# Patient Record
Sex: Male | Born: 1955 | Race: Black or African American | Hispanic: No | Marital: Single | Smoking: Current every day smoker
Health system: Southern US, Community
[De-identification: ages and names within clinical notes are randomized; demographics above are authoritative.]

## PROBLEM LIST (undated history)

## (undated) DIAGNOSIS — I10 Essential (primary) hypertension: Secondary | ICD-10-CM

## (undated) DIAGNOSIS — Z91199 Patient's noncompliance with other medical treatment and regimen due to unspecified reason: Secondary | ICD-10-CM

## (undated) DIAGNOSIS — N182 Chronic kidney disease, stage 2 (mild): Secondary | ICD-10-CM

## (undated) DIAGNOSIS — I739 Peripheral vascular disease, unspecified: Secondary | ICD-10-CM

## (undated) DIAGNOSIS — I219 Acute myocardial infarction, unspecified: Secondary | ICD-10-CM

## (undated) DIAGNOSIS — Z720 Tobacco use: Secondary | ICD-10-CM

## (undated) DIAGNOSIS — T8189XA Other complications of procedures, not elsewhere classified, initial encounter: Secondary | ICD-10-CM

## (undated) DIAGNOSIS — E785 Hyperlipidemia, unspecified: Secondary | ICD-10-CM

## (undated) DIAGNOSIS — Z9119 Patient's noncompliance with other medical treatment and regimen: Secondary | ICD-10-CM

## (undated) DIAGNOSIS — E119 Type 2 diabetes mellitus without complications: Secondary | ICD-10-CM

## (undated) DIAGNOSIS — F0390 Unspecified dementia without behavioral disturbance: Secondary | ICD-10-CM

## (undated) DIAGNOSIS — I2510 Atherosclerotic heart disease of native coronary artery without angina pectoris: Secondary | ICD-10-CM

## (undated) DIAGNOSIS — G9340 Encephalopathy, unspecified: Secondary | ICD-10-CM

## (undated) DIAGNOSIS — Z89611 Acquired absence of right leg above knee: Secondary | ICD-10-CM

## (undated) HISTORY — PX: FRACTURE SURGERY: SHX138

## (undated) SURGICAL SUPPLY — 1 items: SOLN 0.9% NACL 1000 ML (IV SOLUTION) ×2 IMPLANT

---

## 1998-06-25 HISTORY — PX: TIBIA FRACTURE SURGERY: SHX806

## 1998-11-08 ENCOUNTER — Emergency Department (HOSPITAL_COMMUNITY): Admission: EM | Admit: 1998-11-08 | Discharge: 1998-11-08 | Payer: Self-pay | Admitting: Emergency Medicine

## 1998-11-08 ENCOUNTER — Encounter: Payer: Self-pay | Admitting: Emergency Medicine

## 1998-11-14 ENCOUNTER — Encounter: Admission: RE | Admit: 1998-11-14 | Discharge: 1998-11-14 | Payer: Self-pay | Admitting: Internal Medicine

## 1999-01-10 ENCOUNTER — Emergency Department (HOSPITAL_COMMUNITY): Admission: EM | Admit: 1999-01-10 | Discharge: 1999-01-10 | Payer: Self-pay | Admitting: Emergency Medicine

## 2002-10-19 ENCOUNTER — Encounter: Payer: Self-pay | Admitting: Emergency Medicine

## 2002-10-20 ENCOUNTER — Inpatient Hospital Stay (HOSPITAL_COMMUNITY): Admission: AC | Admit: 2002-10-20 | Discharge: 2002-10-23 | Payer: Self-pay

## 2002-10-27 ENCOUNTER — Encounter: Admission: RE | Admit: 2002-10-27 | Discharge: 2002-10-27 | Payer: Self-pay | Admitting: Family Medicine

## 2004-06-12 ENCOUNTER — Inpatient Hospital Stay (HOSPITAL_COMMUNITY): Admission: EM | Admit: 2004-06-12 | Discharge: 2004-06-14 | Payer: Self-pay | Admitting: Emergency Medicine

## 2005-10-24 ENCOUNTER — Encounter: Payer: Self-pay | Admitting: Emergency Medicine

## 2012-10-18 ENCOUNTER — Emergency Department (HOSPITAL_COMMUNITY): Payer: Self-pay

## 2012-10-18 ENCOUNTER — Emergency Department (HOSPITAL_COMMUNITY)
Admission: EM | Admit: 2012-10-18 | Discharge: 2012-10-18 | Disposition: A | Discharge status: Home / Self Care | Payer: Self-pay | Attending: Emergency Medicine | Admitting: Emergency Medicine

## 2012-10-18 ENCOUNTER — Encounter (HOSPITAL_COMMUNITY): Payer: Self-pay | Admitting: Physical Medicine and Rehabilitation

## 2012-10-18 DIAGNOSIS — S6390XA Sprain of unspecified part of unspecified wrist and hand, initial encounter: Secondary | ICD-10-CM | POA: Insufficient documentation

## 2012-10-18 DIAGNOSIS — S6391XA Sprain of unspecified part of right wrist and hand, initial encounter: Secondary | ICD-10-CM

## 2012-10-18 DIAGNOSIS — Y939 Activity, unspecified: Secondary | ICD-10-CM | POA: Insufficient documentation

## 2012-10-18 DIAGNOSIS — Y929 Unspecified place or not applicable: Secondary | ICD-10-CM | POA: Insufficient documentation

## 2012-10-18 DIAGNOSIS — X58XXXA Exposure to other specified factors, initial encounter: Secondary | ICD-10-CM | POA: Insufficient documentation

## 2012-10-18 DIAGNOSIS — R05 Cough: Secondary | ICD-10-CM | POA: Insufficient documentation

## 2012-10-18 DIAGNOSIS — R059 Cough, unspecified: Secondary | ICD-10-CM | POA: Insufficient documentation

## 2012-10-18 DIAGNOSIS — R053 Chronic cough: Secondary | ICD-10-CM

## 2012-10-18 DIAGNOSIS — R079 Chest pain, unspecified: Secondary | ICD-10-CM | POA: Insufficient documentation

## 2012-10-18 MED ORDER — HYDROCODONE-ACETAMINOPHEN 5-325 MG PO TABS
1.0000 | ORAL_TABLET | Freq: Once | ORAL | Status: AC
  Administered 2012-10-18: 1 via ORAL
  Filled 2012-10-18: qty 1

## 2012-10-18 MED ORDER — TRAMADOL HCL 50 MG PO TABS: 50.0000 mg | ORAL_TABLET | Freq: Four times a day (QID) | ORAL | Status: DC | PRN

## 2012-10-18 NOTE — ED Provider Notes (Signed)
Medical screening examination/treatment/procedure(s) were performed by non-physician practitioner and as supervising physician I was immediately available for consultation/collaboration.   Charles B. Sheldon, MD 10/18/12 1543 

## 2012-10-18 NOTE — ED Provider Notes (Signed)
History     CSN: [id number]  Arrival date & time 10/18/12  1013   First MD Initiated Contact with Patient 10/18/12 1107      Chief Complaint  Patient presents with  . Cough  . Pain    (Consider location/radiation/quality/duration/timing/severity/associated sxs/prior treatment) Patient is a 57 y.o. male presenting with cough. The history is provided by the patient.  Cough Cough characteristics:  Productive Severity:  Moderate Duration:  4 weeks Timing:  Intermittent Associated symptoms: chest pain   Associated symptoms: no chills, no fever, no myalgias and no sore throat   Associated symptoms comment:  Cough for the past month now with severe right sided rib pain with movement or cough. No fever. He also complains of right hand pain that started this morning without known injury.   No past medical history on file.  No past surgical history on file.  History reviewed. No pertinent family history.  History  Substance Use Topics  . Smoking status: Never Smoker   . Smokeless tobacco: Not on file  . Alcohol Use: No      Review of Systems  Constitutional: Negative for fever and chills.  HENT: Negative for congestion, sore throat, neck pain and sinus pressure.   Respiratory: Positive for cough.   Cardiovascular: Positive for chest pain.  Gastrointestinal: Negative for vomiting and abdominal pain.  Musculoskeletal: Negative for myalgias.    Allergies  Review of patient's allergies indicates no known allergies.  Home Medications  No current outpatient prescriptions on file.  BP 156/96  Pulse 95  Temp(Src) 97.7 F (36.5 C) (Oral)  Resp 20  SpO2 99%  Physical Exam  Constitutional: He is oriented to person, place, and time. He appears well-developed and well-nourished.  HENT:  Head: Normocephalic.  Neck: Normal range of motion. Neck supple.  Cardiovascular: Normal rate and regular rhythm.   Pulmonary/Chest: Effort normal and breath sounds normal. He exhibits  tenderness.  Right sided chest tenderness along mid-axillary line, lower chest.  Abdominal: Soft. Bowel sounds are normal. There is no tenderness. There is no rebound and no guarding.  Musculoskeletal: Normal range of motion.  Neurological: He is alert and oriented to person, place, and time.  Skin: Skin is warm and dry. No rash noted.  Psychiatric: He has a normal mood and affect.    ED Course  Procedures (including critical care time)  Labs Reviewed - No data to display No results found.  Dg Ribs Unilateral W/chest Right  10/18/2012  *RADIOLOGY REPORT*  Clinical Data: Cough.  Right-sided chest pain.  RIGHT RIBS AND CHEST - 3+ VIEW  Comparison: Chest x-ray 06/12/2004.  Findings: Lung volumes are normal.  No consolidative airspace disease.  No pleural effusions.  No pneumothorax.  No pulmonary nodule or mass noted.  Pulmonary vasculature and the cardiomediastinal silhouette are within normal limits.  Multiple old healed bilateral rib fractures.  Dedicated views of the right ribs demonstrate no definite acute displaced right-sided rib fractures.  IMPRESSION: 1.  No radiographic evidence of acute cardiopulmonary disease. 2.  No acute displaced right-sided rib fractures. 3.  Multiple old healed bilateral rib fractures are again noted.   Original Report Authenticated By: Trudie Reed, M.D.    No diagnosis found.  1. Cough, persistent 2. Mild hand sprain  MDM  CXR without pneumonia or rib injury. Will prescribe abx based on duration of symptoms of greater than one month.         Arnoldo Hooker, PA-C 10/18/12 1306

## 2012-10-18 NOTE — ED Notes (Signed)
Pt presents to department for evaluation of cough, R sided rib pain, R hand and leg pain. Ongoing for several days. Respirations unlabored. Speaking complete sentences. Pt is alert and oriented x4. No signs of acute distress noted.

## 2013-11-13 ENCOUNTER — Ambulatory Visit (INDEPENDENT_AMBULATORY_CARE_PROVIDER_SITE_OTHER): Payer: Self-pay | Admitting: Internal Medicine

## 2013-11-13 ENCOUNTER — Encounter: Payer: Self-pay | Admitting: Internal Medicine

## 2013-11-13 ENCOUNTER — Ambulatory Visit (HOSPITAL_COMMUNITY)
Admission: RE | Admit: 2013-11-13 | Discharge: 2013-11-13 | Disposition: A | Discharge status: Home / Self Care | Payer: Self-pay | Source: Ambulatory Visit | Attending: Internal Medicine | Admitting: Internal Medicine

## 2013-11-13 ENCOUNTER — Encounter (HOSPITAL_COMMUNITY): Payer: Self-pay | Admitting: Internal Medicine

## 2013-11-13 ENCOUNTER — Observation Stay (HOSPITAL_COMMUNITY)
Admission: AD | Admit: 2013-11-13 | Discharge: 2013-11-14 | Disposition: A | Discharge status: Home / Self Care | Payer: Self-pay | Source: Ambulatory Visit | Attending: Internal Medicine | Admitting: Internal Medicine

## 2013-11-13 VITALS — BP 249/136 | HR 74 | Temp 97.4°F | Ht 67.0 in | Wt 120.3 lb

## 2013-11-13 DIAGNOSIS — Z23 Encounter for immunization: Secondary | ICD-10-CM | POA: Insufficient documentation

## 2013-11-13 DIAGNOSIS — R9431 Abnormal electrocardiogram [ECG] [EKG]: Secondary | ICD-10-CM

## 2013-11-13 DIAGNOSIS — I10 Essential (primary) hypertension: Principal | ICD-10-CM | POA: Insufficient documentation

## 2013-11-13 DIAGNOSIS — I160 Hypertensive urgency: Secondary | ICD-10-CM | POA: Diagnosis present

## 2013-11-13 HISTORY — DX: Essential (primary) hypertension: I10

## 2013-11-13 LAB — URINALYSIS, ROUTINE W REFLEX MICROSCOPIC
BILIRUBIN URINE: NEGATIVE
Glucose, UA: NEGATIVE mg/dL
KETONES UR: NEGATIVE mg/dL
Leukocytes, UA: NEGATIVE
Nitrite: NEGATIVE
PH: 7.5 (ref 5.0–8.0)
Protein, ur: NEGATIVE mg/dL
Specific Gravity, Urine: 1.009 (ref 1.005–1.030)
Urobilinogen, UA: 0.2 mg/dL (ref 0.0–1.0)

## 2013-11-13 LAB — COMPLETE METABOLIC PANEL WITH GFR
ALT: 20 U/L (ref 0–53)
AST: 17 U/L (ref 0–37)
Albumin: 3.9 g/dL (ref 3.5–5.2)
Alkaline Phosphatase: 58 U/L (ref 39–117)
BUN: 6 mg/dL (ref 6–23)
CO2: 28 mEq/L (ref 19–32)
Calcium: 9.3 mg/dL (ref 8.4–10.5)
Chloride: 100 mEq/L (ref 96–112)
Creat: 0.92 mg/dL (ref 0.50–1.35)
GFR, Est African American: >89 mL/min
GFR, Est Non African American: >89 mL/min
Glucose, Bld: 95 mg/dL (ref 70–99)
Potassium: 5.3 mEq/L (ref 3.5–5.3)
Sodium: 139 mEq/L (ref 135–145)
Total Bilirubin: 0.5 mg/dL (ref 0.3–1.2)
Total Protein: 7.5 g/dL (ref 6.0–8.3)

## 2013-11-13 LAB — CBC WITH DIFFERENTIAL/PLATELET
Basophils Absolute: 0.1 10*3/uL (ref 0.0–0.1)
Basophils Relative: 1 % (ref 0–1)
Eosinophils Absolute: 0.2 10*3/uL (ref 0.0–0.7)
Eosinophils Relative: 4 % (ref 0–5)
HCT: 44.4 % (ref 39.0–52.0)
Hemoglobin: 14.7 g/dL (ref 13.0–17.0)
Lymphocytes Relative: 15 % (ref 12–46)
Lymphs Abs: 0.9 10*3/uL (ref 0.7–4.0)
MCH: 33.7 pg (ref 26.0–34.0)
MCHC: 33.1 g/dL (ref 30.0–36.0)
MCV: 101.8 fL — ABNORMAL HIGH (ref 78.0–100.0)
Monocytes Absolute: 0.6 10*3/uL (ref 0.1–1.0)
Monocytes Relative: 10 % (ref 3–12)
Neutro Abs: 4 10*3/uL (ref 1.7–7.7)
Neutrophils Relative %: 70 % (ref 43–77)
Platelets: 338 10*3/uL (ref 150–400)
RBC: 4.36 MIL/uL (ref 4.22–5.81)
RDW: 13.9 % (ref 11.5–15.5)
WBC: 5.7 10*3/uL (ref 4.0–10.5)

## 2013-11-13 LAB — RAPID URINE DRUG SCREEN, HOSP PERFORMED
AMPHETAMINES: NOT DETECTED
BARBITURATES: NOT DETECTED
BENZODIAZEPINES: NOT DETECTED
Cocaine: NOT DETECTED
Opiates: NOT DETECTED
Tetrahydrocannabinol: NOT DETECTED

## 2013-11-13 LAB — HEMOGLOBIN A1C
Hgb A1c MFr Bld: 5.5 % (ref <5.7)
Mean Plasma Glucose: 111 mg/dL (ref <117)

## 2013-11-13 LAB — URINE MICROSCOPIC-ADD ON

## 2013-11-13 LAB — TROPONIN I

## 2013-11-13 MED ORDER — PNEUMOCOCCAL VAC POLYVALENT 25 MCG/0.5ML IJ INJ
0.5000 mL | INJECTION | INTRAMUSCULAR | Status: AC
  Administered 2013-11-14: 0.5 mL via INTRAMUSCULAR
  Filled 2013-11-13: qty 0.5

## 2013-11-13 MED ORDER — VITAMIN B-1 100 MG PO TABS
100.0000 mg | ORAL_TABLET | Freq: Every day | ORAL | Status: DC
  Administered 2013-11-13 – 2013-11-14 (×2): 100 mg via ORAL
  Filled 2013-11-13 (×2): qty 1

## 2013-11-13 MED ORDER — LORAZEPAM 2 MG/ML IJ SOLN: 1.0000 mg | Freq: Four times a day (QID) | INTRAMUSCULAR | Status: DC | PRN

## 2013-11-13 MED ORDER — SODIUM CHLORIDE 0.9 % IJ SOLN
3.0000 mL | Freq: Two times a day (BID) | INTRAMUSCULAR | Status: DC
  Administered 2013-11-14: 3 mL via INTRAVENOUS

## 2013-11-13 MED ORDER — CLONIDINE HCL 0.1 MG PO TABS
0.1000 mg | ORAL_TABLET | Freq: Two times a day (BID) | ORAL | Status: DC
  Administered 2013-11-13: 0.1 mg via ORAL
  Filled 2013-11-13 (×2): qty 1

## 2013-11-13 MED ORDER — THIAMINE HCL 100 MG/ML IJ SOLN
100.0000 mg | Freq: Every day | INTRAMUSCULAR | Status: DC
  Filled 2013-11-13 (×2): qty 1

## 2013-11-13 MED ORDER — SODIUM CHLORIDE 0.9 % IJ SOLN: 3.0000 mL | INTRAMUSCULAR | Status: DC | PRN

## 2013-11-13 MED ORDER — FOLIC ACID 1 MG PO TABS
1.0000 mg | ORAL_TABLET | Freq: Every day | ORAL | Status: DC
  Administered 2013-11-13 – 2013-11-14 (×2): 1 mg via ORAL
  Filled 2013-11-13 (×2): qty 1

## 2013-11-13 MED ORDER — ADULT MULTIVITAMIN W/MINERALS CH
1.0000 | ORAL_TABLET | Freq: Every day | ORAL | Status: DC
  Administered 2013-11-13 – 2013-11-14 (×2): 1 via ORAL
  Filled 2013-11-13 (×2): qty 1

## 2013-11-13 MED ORDER — HYDROCHLOROTHIAZIDE 25 MG PO TABS
25.0000 mg | ORAL_TABLET | Freq: Every day | ORAL | Status: DC
  Administered 2013-11-13 – 2013-11-14 (×2): 25 mg via ORAL
  Filled 2013-11-13 (×2): qty 1

## 2013-11-13 MED ORDER — LORAZEPAM 1 MG PO TABS: 1.0000 mg | ORAL_TABLET | Freq: Four times a day (QID) | ORAL | Status: DC | PRN

## 2013-11-13 MED ORDER — SODIUM CHLORIDE 0.9 % IJ SOLN
3.0000 mL | Freq: Two times a day (BID) | INTRAMUSCULAR | Status: DC
Start: 2013-11-13 — End: 2013-11-14
  Administered 2013-11-14: 3 mL via INTRAVENOUS

## 2013-11-13 MED ORDER — SODIUM CHLORIDE 0.9 % IV SOLN: 250.0000 mL | INTRAVENOUS | Status: DC | PRN

## 2013-11-13 MED ORDER — ENOXAPARIN SODIUM 40 MG/0.4ML ~~LOC~~ SOLN
40.0000 mg | SUBCUTANEOUS | Status: DC
  Filled 2013-11-13: qty 0.4

## 2013-11-13 NOTE — H&P (Signed)
Internal Medicine On-Call Attending Admission Note Date: 11/13/2013  Patient name: Stephen Bowen Medical record number: [id number] Date of birth: Dec 08, 1955 Age: 58 y.o. Gender: male  I saw and evaluated the patient. I reviewed the resident's note and I agree with the resident's findings and plan as documented in the resident's note, with the following additional comments.  Chief Complaint(s): Severe Hypertension  History - key components related to admission: Patient is a 58 year old man who reports no known history of high blood pressure who presented to clinic for first time today in order to establish care and was found to have severe asymptomatic hypertension.  Patient had no symptoms, denies headache, visual changes, chest pain, dyspnea, abdominal pain, lower extremity edema.  He is an active smoker.  Family history is notable for stroke and heart disease.   Physical Exam - key components related to admission:  Filed Vitals:   11/13/13 1409 11/13/13 1748 11/13/13 1838 11/13/13 2037  BP: 237/112 173/100 140/82 139/88  Pulse: 63 82 76 68  Temp: 97.6 F (36.4 C)   98 F (36.7 C)  TempSrc: Oral   Oral  Resp: 18   18  SpO2: 98%   98%    General appearance: alert, cooperative and no distress Lungs: clear to auscultation bilaterally Heart: regular rate and rhythm, S1, S2 normal, no murmur, click, rub or gallop Abdomen: soft, non-tender; bowel sounds normal; no masses,  no organomegaly Extremities: extremities normal, atraumatic, no cyanosis or edema  Lab results:   Basic Metabolic Panel:  Recent Labs  03/70/96 1031  NA 139  K 5.3  CL 100  CO2 28  GLUCOSE 95  BUN 6  CREATININE 0.92  CALCIUM 9.3    Liver Function Tests:  Recent Labs  11/13/13 1031  AST 17  ALT 20  ALKPHOS 58  BILITOT 0.5  PROT 7.5  ALBUMIN 3.9     CBC:  Recent Labs  11/13/13 1031  WBC 5.7  HGB 14.7  HCT 44.4  MCV 101.8*  PLT 338    Recent Labs  11/13/13 1031  NEUTROABS  4.0  LYMPHSABS 0.9  MONOABS 0.6  EOSABS 0.2  BASOSABS 0.1    Cardiac Enzymes:  Recent Labs  11/13/13 1620  TROPONINI <0.30    Hemoglobin A1C:  Recent Labs  11/13/13 1620  HGBA1C 5.5    Urine Drug Screen: Drugs of Abuse     Component Value Date/Time   LABOPIA NONE DETECTED 11/13/2013 1634   COCAINSCRNUR NONE DETECTED 11/13/2013 1634   LABBENZ NONE DETECTED 11/13/2013 1634   AMPHETMU NONE DETECTED 11/13/2013 1634   THCU NONE DETECTED 11/13/2013 1634   LABBARB NONE DETECTED 11/13/2013 1634      Urinalysis    Component Value Date/Time   COLORURINE YELLOW 11/13/2013 1634   APPEARANCEUR CLEAR 11/13/2013 1634   LABSPEC 1.009 11/13/2013 1634   PHURINE 7.5 11/13/2013 1634   GLUCOSEU NEGATIVE 11/13/2013 1634   HGBUR SMALL* 11/13/2013 1634   BILIRUBINUR NEGATIVE 11/13/2013 1634   KETONESUR NEGATIVE 11/13/2013 1634   PROTEINUR NEGATIVE 11/13/2013 1634   UROBILINOGEN 0.2 11/13/2013 1634   NITRITE NEGATIVE 11/13/2013 1634   LEUKOCYTESUR NEGATIVE 11/13/2013 1634    Urine microscopic:  Recent Labs  11/13/13 1634  WBCU 0-2  RBCU 0-2  BACTERIA RARE     Other results: EKG: Normal sinus rhythm with sinus arrhythmia; anteroseptal infarct , age undetermined  Assessment & Plan by Problem:  1. Severe asymptomatic hypertension (hypertensive urgency).  Patient has been on no  treatment.  Given the severity of his presenting blood pressures, confirmed on several repeat measurements in clinic, as well as the uncertain follow-up, patient was admitted for observation, monitoring, and careful institution of oral therapy.  He responded well to initial dose of clonidine, and HCTZ was started.  Plan is to follow blood pressure closely and treat as indicated.  If HCTZ does not provide adequate control as a single medication, then amlodipine or an ACE inhibitor would be reasonable to add as a second agent.  2.  EKG findings suggestive of prior MI.  Patient denies any anginal symptoms or known history  of ischemic heart disease.  Plan is 2-D echocardiogram; serial cardiac enzymes; consider elective cardiology referral for stress study.  3.  Smoking.  Plan is counsel and assist with smoking cessation.  4.  Mild macrocytosis.  Would check vitamin B12 and folate level.  5.  Other problems and plans as per the resident physician's note.

## 2013-11-13 NOTE — H&P (Signed)
Date: 11/13/2013               Patient Name:  Stephen Bowen MRN: [id number]  DOB: 10/16/1955 Age / Sex: 58 y.o., male   PCP: No Pcp Per Patient              Medical Service: Internal Medicine Teaching Service              Attending Physician: Dr. Madilyn Fireman, MD    First Contact:  MS Lorella Nimrod Pager: [phone]  Second Contact: Dr. Gordy Levan Pager: [phone]  Third Contact Dr. Aundra Dubin Pager: [phone]       After Hours (After 5p/  First Contact Pager: [phone]  weekends / holidays): Second Contact Pager: [phone]   Chief Complaint:   History of Present Illness:Mr. Tupper is a 35 old male with no present complaints, came to clinic to establish care , have not seen a physician since long time, found to have a BP of 249/136. Denies any headache, change in vision, SOB, palpitation, chest pain,N/V, fever , chills, recent change in bowel or urinary habits. No recent change in wt. Or appetite.He has a FH oh stoke in sister and heart disease in brother and father, so admitted with a goal of slowly lower his BP with clonidine .1 mg po, repeat if needed after 2 hr.C/O occasional pain in his left leg since he had a accident resulting in fracture and rod placement in 2000.  Meds: Current Facility-Administered Medications  Medication Dose Route Frequency Provider Last Rate Last Dose  . 0.9 %  sodium chloride infusion  250 mL Intravenous PRN Cresenciano Genre, MD      . folic acid (FOLVITE) tablet 1 mg  1 mg Oral Daily Cresenciano Genre, MD   1 mg at 11/13/13 1834  . hydrochlorothiazide (HYDRODIURIL) tablet 25 mg  25 mg Oral Daily Cresenciano Genre, MD   25 mg at 11/13/13 1834  . LORazepam (ATIVAN) tablet 1 mg  1 mg Oral Q6H PRN Cresenciano Genre, MD       Or  . LORazepam (ATIVAN) injection 1 mg  1 mg Intravenous Q6H PRN Cresenciano Genre, MD      . multivitamin with minerals tablet 1 tablet  1 tablet Oral Daily Cresenciano Genre, MD   1 tablet at 11/13/13 1834  . [START ON 11/14/2013] pneumococcal 23 valent vaccine  (PNU-IMMUNE) injection 0.5 mL  0.5 mL Intramuscular Tomorrow-1000 Shilpa Bhardwaj, MD      . sodium chloride 0.9 % injection 3 mL  3 mL Intravenous Q12H Cresenciano Genre, MD      . sodium chloride 0.9 % injection 3 mL  3 mL Intravenous Q12H Cresenciano Genre, MD      . sodium chloride 0.9 % injection 3 mL  3 mL Intravenous PRN Cresenciano Genre, MD      . thiamine (VITAMIN B-1) tablet 100 mg  100 mg Oral Daily Cresenciano Genre, MD   100 mg at 11/13/13 1834   Or  . thiamine (B-1) injection 100 mg  100 mg Intravenous Daily Cresenciano Genre, MD        Allergies: Allergies as of 11/13/2013  . (No Known Allergies)   Past Medical History  Diagnosis Date  . HTN (hypertension)    Past Surgical History  Procedure Laterality Date  . 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   Family History  Problem  Relation Age of Onset  . Cancer Mother     deceased in 25s   . Heart disease Father   . Stroke Sister     age 67s  . Heart disease Brother     pacemaker in his 70s  . Heart attack Mother     diseased   SOCIAL HISTORY: Smoker X 40 yrs. About 1/2 PPD                              ETOH: 24 oz of beer every other day, no recreational drugs.  Review of Systems: Pertinent items are noted in HPI.  Physical Exam: Blood pressure 140/82, pulse 76, temperature 97.6 F (36.4 C), temperature source Oral, resp. rate 18, SpO2 98.00%. BP 140/82  Pulse 76  Temp(Src) 97.6 F (36.4 C) (Oral)  Resp 18  SpO2 98%  General Appearance:    Alert, cooperative, no distress, appears stated age  Head:    Normocephalic, without obvious abnormality, atraumatic  Eyes:    PERRL, conjunctiva/corneas clear, EOM's intact, fundi    benign, both eyes       Ears:    Normal TM's and external ear canals, both ears  Nose:   Nares normal, septum midline, mucosa normal, no drainage    or sinus tenderness  Throat:   Lips, mucosa, and tongue normal; teeth and gums normal  Neck:   Supple,  symmetrical, trachea midline, no adenopathy;       thyroid:  No enlargement/tenderness/nodules; no carotid   bruit or JVD  Back:     Symmetric, no curvature, ROM normal, no CVA tenderness  Lungs:     Clear to auscultation bilaterally, respirations unlabored  Chest wall:    No tenderness or deformity  Heart:    Regular rate and rhythm, S1 and S2 normal, no murmur, rub   or gallop  Abdomen:     Soft, non-tender, bowel sounds active all four quadrants,    no masses, no organomegaly     Extremities:   Extremities normal, atraumatic, no cyanosis or edema  Pulses:   2+ and symmetric all extremities  Skin:   Skin color, texture, turgor normal, no rashes or lesions  Lymph nodes:   Cervical, supraclavicular, and axillary nodes normal  Neurologic:   CNII-XII intact. Normal strength, sensation and reflexes      throughout    Lab results:  Sodium 139      Sodium   Potassium 5.3      Potassium   Chloride 100      Chloride   CO2 28      CO2   BUN 6      BUN   Creat 0.92      Creat   Calcium 9.3      Calcium   Glucose, Bld 95      Glucose, Bld   Alkaline Phosphatase 58      Alkaline Phosphatase   Albumin 3.9      Albumin   AST 17      AST   ALT 20      ALT   Total Protein 7.5      Total Protein   Total Bilirubin 0.5      Total Bilirubin   GFR, Est African American >89      GFR, Est African American   GFR, Est Non African American >89      GFR, Est Non African American  CARDIAC PROFILE    Troponin I   <0.30    Troponin I   CBC    WBC 5.7      WBC   RBC 4.36      RBC   Hemoglobin 14.7      Hemoglobin   HCT 44.4      HCT   MCV 101.8      MCV   MCH 33.7      MCH   MCHC 33.1      MCHC   RDW 13.9      RDW   Platelets 338      Platelets   DIFFERENTIAL    Neutrophils Relative % 70      Neutrophils Relative %   Lymphocytes Relative 15      Lymphocytes Relative   Monocytes Relative 10      Monocytes Relative   Eosinophils Relative 4      Eosinophils Relative   Basophils Relative 1       Basophils Relative   Neutro Abs 4.0      Neutro Abs   Lymphs Abs 0.9      Lymphs Abs   Monocytes Absolute 0.6      Monocytes Absolute   Eosinophils Absolute 0.2      Eosinophils Absolute   Basophils Absolute 0.1      Basophils Absolute   Smear Review Criteria for review not met      Smear Review   DIABETES    Glucose, Bld 95      Glucose, Bld   URINALYSIS    Color, Urine    YELLOW   Color, Urine   APPearance    CLEAR   APPearance   Specific Gravity, Urine    1.009   Specific Gravity, Urine   pH    7.5   pH   Glucose, UA    NEGATIVE   Glucose, UA   Bilirubin Urine    NEGATIVE   Bilirubin Urine   Ketones, ur    NEGATIVE   Ketones, ur   Protein, ur    NEGATIVE   Protein, ur   Urobilinogen, UA    0.2   Urobilinogen, UA   Nitrite    NEGATIVE   Nitrite   Leukocytes, UA    NEGATIVE   Leukocytes, UA   Hgb urine dipstick    SMALL   Hgb urine dipstick   WBC, UA    0-2   WBC, UA   RBC / HPF    0-2   RBC / HPF   Bacteria, UA    RARE   Bacteria, UA   TOX, URINE    Amphetamines    NONE DETECTED   Amphetamines   Barbiturates    NONE DETECTED   Barbiturates   Benzodiazepines    NONE DETECTED   Benzodiazepines   Opiates    NONE DETECTED   Opiates   Cocaine    NONE DETECTED   Cocaine   Tetrahydrocannabinol    NONE DETECTED   Tetrahydrocannabinol     Imaging results:  No results found.  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 Assessment & Plan by Problem: Active Problems:   Hypertensive urgency  HYPERTENSIVE URGENCY: Monitor and try to noemalize his BP slowly, had 1 dose of clonidine and HTZC and his BP become 140/82. Hold further clonidine,and reassess. Need a work up for any end organ damage. This is a Careers information officer Note.  The care of the patient was discussed with Dr. Aundra Dubin and the assessment and plan was formulated with their assistance.  Please see their note for official documentation of the patient encounter.    Signed: Lorella Nimrod, Med Student 11/13/2013, 7:28 PM

## 2013-11-13 NOTE — Progress Notes (Signed)
  Echocardiogram 2D Echocardiogram has been performed.  Hermine Messick 11/13/2013, 5:50 PM

## 2013-11-13 NOTE — Assessment & Plan Note (Signed)
Patient has age undetermined anteroseptal infarct with normal PR and QRS intervals.  Patient is currently asymptomatic and has no signs of active ischemia.  Plans: Patient is to be admitted to the teaching service for the management of severe hypertension. Consider 2D Echocardiogram to further evaluate LVEF, Wall motion abnormalities. Management is per the admitting team.

## 2013-11-13 NOTE — H&P (Signed)
Date: 11/13/2013               Patient Name:  Stephen Bowen MRN: [id number]  DOB: 1956-03-05 Age / Sex: 58 y.o., male   PCP: No Pcp Per Patient           Medical Service: Internal Medicine Teaching Service         Attending Physician: Dr. Madilyn Fireman, MD    First Contact: Dr. Michail Jewels, MD Pager: [phone] (7AM-5PM Mon-Fri)  Second Contact: Dr. Karlyn Agee, MD Pager: [phone]       After Hours (After 5p/  First Contact Pager: [phone]  weekends / holidays): Second Contact Pager: [phone]    Most Recent Discharge Date:  10/18/12  Chief Complaint: Hypertension   History of Present Illness:  Stephen Bowen is a 58 y.o. male who has a past medical history of HTN but no other known history.  Pt presents to the [facility] to establish care with hypertension with BP of 217/213.  He denies any fever, HA, changes in vision, dizziness, CP, diaphoresis, palpitations, SOB, abdominal pain, N/V/D/C, or  urinary symptoms.  He denies any OTC medications (including cold medicines), NSAID use, or other recreational drug use (many years ago).  He admits to drinking a one tall beer every 2-3 days.  He has smoked cigarettes for most of his adult life and currently smokes about 3 cig/day.  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  There is a FH of CVA and heart disease.   EKG shows Q waves in the precordial leads.     Meds: Current Facility-Administered Medications  Medication Dose Route Frequency Provider Last Rate Last Dose  . 0.9 %  sodium chloride infusion  250 mL Intravenous PRN Cresenciano Genre, MD      . folic acid (FOLVITE) tablet 1 mg  1 mg Oral Daily Cresenciano Genre, MD   1 mg at 11/13/13 1834  . hydrochlorothiazide (HYDRODIURIL) tablet 25 mg  25 mg Oral Daily Cresenciano Genre, MD   25 mg at 11/13/13 1834  . LORazepam (ATIVAN) tablet 1 mg  1 mg Oral Q6H PRN Cresenciano Genre, MD       Or  . LORazepam (ATIVAN) injection 1 mg  1 mg Intravenous Q6H PRN Cresenciano Genre, MD      . multivitamin with  minerals tablet 1 tablet  1 tablet Oral Daily Cresenciano Genre, MD   1 tablet at 11/13/13 1834  . [START ON 11/14/2013] pneumococcal 23 valent vaccine (PNU-IMMUNE) injection 0.5 mL  0.5 mL Intramuscular Tomorrow-1000 Shilpa Bhardwaj, MD      . sodium chloride 0.9 % injection 3 mL  3 mL Intravenous Q12H Cresenciano Genre, MD      . sodium chloride 0.9 % injection 3 mL  3 mL Intravenous Q12H Cresenciano Genre, MD      . sodium chloride 0.9 % injection 3 mL  3 mL Intravenous PRN Cresenciano Genre, MD      . thiamine (VITAMIN B-1) tablet 100 mg  100 mg Oral Daily Cresenciano Genre, MD   100 mg at 11/13/13 1834   Or  . thiamine (B-1) injection 100 mg  100 mg Intravenous Daily Cresenciano Genre, MD       No prescriptions prior to admission   Allergies: Allergies as of 11/13/2013  . (No Known Allergies)    PMH: Past Medical History  Diagnosis Date  . HTN (hypertension)  PSH: Past Surgical History  Procedure Laterality Date  . 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    FH: Family History  Problem Relation Age of Onset  . Cancer Mother     deceased in 64s   . Heart disease Father   . Stroke Sister     age 24s  . Heart disease Brother     pacemaker in his 1s  . Heart attack Mother     diseased    SH: History  Substance Use Topics  . Smoking status: Current Every Day Smoker -- 0.50 packs/day for 40 years    Types: Cigarettes  . Smokeless tobacco: Never Used  . Alcohol Use: 8.4 oz/week    14 Cans of beer per week    Review of Systems: Pertinent items are noted in HPI.  Physical Exam: Filed Vitals:   11/13/13 1409 11/13/13 1748 11/13/13 1838  BP: 237/112 173/100 140/82  Pulse: 63 82 76  Temp: 97.6 F (36.4 C)    TempSrc: Oral    Resp: 18    SpO2: 98%    98% on RA  Physical Exam Constitutional: Vital signs reviewed.  Pt sitting on the edge of the bed appearing much older than stated age in no acute distress and cooperative with exam.   Head: Normocephalic and atraumatic Eyes: PERRL, EOMI, conjunctivae normal, no scleral icterus Neck: Supple, trachea midline  Cardiovascular: RRR,  no MRG, pulses symmetric and intact bilaterally Pulmonary/Chest: normal respiratory effort, CTAB, no wheezes, rales, or rhonchi Abdominal: Thin.  Soft. Non-tender, non-distended, bowel sounds are normal, no masses, organomegaly, or guarding present.  Musculoskeletal: No joint deformities, erythema, or stiffness Neurological: A&O x3, cranial nerve II-XII are grossly intact, no focal motor deficit, sensory intact to light touch bilaterally.  Skin: Warm, dry and intact. No rash, cyanosis, or clubbing Psychiatric: Normal mood and affect.   Lab results:  Basic Metabolic Panel:  Recent Labs  11/13/13 1031  NA 139  K 5.3  CL 100  CO2 28  GLUCOSE 95  BUN 6  CREATININE 0.92  CALCIUM 9.3   Anion Gap: 11  Calcium/Magnesium/Phosphorus:  Recent Labs Lab 11/13/13 1031  CALCIUM 9.3    Liver Function Tests:  Recent Labs  11/13/13 1031  AST 17  ALT 20  ALKPHOS 58  BILITOT 0.5  PROT 7.5  ALBUMIN 3.9   No results found for this basename: LIPASE, AMYLASE,  in the last 72 hours No results found for this basename: AMMONIA,  in the last 72 hours  CBC: Lab Results  Component Value Date   WBC 5.7 11/13/2013   HGB 14.7 11/13/2013   HCT 44.4 11/13/2013   MCV 101.8* 11/13/2013   PLT 338 11/13/2013   Lipase: No results found for this basename: LIPASE   Lactic Acid/Procalcitonin: No results found for this basename: LATICACIDVEN, PROCALCITON, O2SATVEN,  in the last 168 hours  Cardiac Enzymes: No results found for this basename: TROPIPOC,  in the last 72 hours Lab Results  Component Value Date   TROPONINI <0.30 11/13/2013    BNP: No results found for this basename: PROBNP,  in the last 72 hours  D-Dimer: No results found for this basename: DDIMER,  in the last 72 hours  CBG: No results found for this basename: GLUCAP,  in the  last 72 hours  Hemoglobin A1C: No results found for this basename: HGBA1C,  in the last 72 hours  Lipid Panel: No results  found for this basename: CHOL, HDL, LDLCALC, TRIG, CHOLHDL, LDLDIRECT,  in the last 72 hours  Thyroid Function Tests: No results found for this basename: TSH, T4TOTAL, FREET4, T3FREE, THYROIDAB,  in the last 72 hours  Anemia Panel: No results found for this basename: VITAMINB12, FOLATE, FERRITIN, TIBC, IRON, RETICCTPCT,  in the last 72 hours  Coagulation: No results found for this basename: LABPROT, INR,  in the last 72 hours  Urine Drug Screen: Drugs of Abuse:     Component Value Date/Time   LABOPIA NONE DETECTED 11/13/2013 [place] 11/13/2013 1634   LABBENZ NONE DETECTED 11/13/2013 1634   AMPHETMU NONE DETECTED 11/13/2013 [place] 11/13/2013 1634   LABBARB NONE DETECTED 11/13/2013 1634    Alcohol Level: No results found for this basename: ETH,  in the last 72 hours  Urinalysis:    Component Value Date/Time   COLORURINE YELLOW 11/13/2013 [place] 11/13/2013 1634   LABSPEC 1.009 11/13/2013 [place] 7.5 11/13/2013 [place] 11/13/2013 [place]* 11/13/2013 [place] 11/13/2013 [place] 11/13/2013 [place] 11/13/2013 1634   UROBILINOGEN 0.2 11/13/2013 1634   NITRITE NEGATIVE 11/13/2013 [place] 11/13/2013 1634    Imaging results:  No results found.  EKG: EKG Interpretation  Date/Time:    Ventricular Rate:    PR Interval:    QRS Duration:   QT Interval:    QTC Calculation:   R Axis:     Text Interpretation:     Orders placed in visit on 11/13/13  . EKG 12-LEAD     Antibiotics: Antibiotics Given (last 72 hours)   None      Anti-infectives   None      SIRS/Sepsis/Septic Shock criteria met:  No  Consults:    Assessment & Plan by Problem: Principal Problem:   Hypertensive  urgency   DAQUAWN SEELMAN is a 58 y.o. male who has a past medical history of HTN but no other known history.  Pt presents to the [facility] with hypertension with BP of 217/213.  Hypertensive urgency Pt presented to [facility] with asymptomatic elevated BP.  No evidence of end organ damage.  UDS was negative.  Target BP goal should be lowered to <160/<122mHg with MAP not lowered by more than 25-30% over several hours.  Should be cautious in this setting as cerebral or myocardial ischemia/infarction can be induced by aggressive antihypertensive therapy.  To achieve po furosemide if volume overloaded, clonidine 0.250m or captopril if not volume depleted at dose of 6.25 or 12.96m23m  -admit to telemetry for obs -0.1mg12monidine now  -HCTZ 296mg3m-troponins x 3  -2D echo -BMP in AM, Lipid panel, HA1c   Substance abuse  Pt has a h/o alcohol and tobacco use. -CIWA protocol  -check Mg, phos  -MVI, thiamine 100mg229NLy, folic acid 1mg d74my -CSW consult  FEN  Fluids-None Electrolytes-Replete as needed Nutrition-Heart healthy  VTE prophylaxis  lovenox 40mg S32m  Disposition Disposition deferred at this time, awaiting improvement of current medical problems. Anticipated discharge in approximately 1-2 day(s).     Emergency Contact Contact Information   Name Relation Home Work Mobile   [place]um,Alford Brother [phone],DThedore Mins         The patient does have a current PCP (No Pcp Per Patient) and does need an  [facility] hospital follow-up appointment after discharge.  Signed Jones Bales, MD PGY-1, Internal Medicine Teaching Service [phone] (7AM-5PM Mon-Fri) 11/13/2013, 7:55 PM

## 2013-11-13 NOTE — Patient Instructions (Signed)
Patient is to be admitted to the teaching service for the management of his severe hypertension.

## 2013-11-13 NOTE — Progress Notes (Signed)
Case discussed with Dr. Comer Locket at the time of the visit.  We reviewed the resident's history and exam and pertinent patient test results.  I agree with the plan documented in the resident's note to admit patient for observation and management of severe asymptomatic hypertension.

## 2013-11-13 NOTE — Progress Notes (Signed)
   Subjective:   Patient ID: Stephen Bowen male   DOB: 15-Feb-1956 58 y.o.   MRN: [id number]  HPI: [name] is a 58 y.o. gentleman with no known PMH comes to the office for the first time to [facility] establish a PCP.  Patient reports that he is feeling "fine" today. He reports that he had a fracture of his left lower leg in 2000 and had a "rod" put in. He complains of occasional pains in his left leg.   He reports smoking half a pack of cigarrettes a day for the last 40 years. He reports drinking 12 cans of beer a day. He denies any use of recreations drugs and denies taking any prescription medicines.   He denies any complaints in the office.    History reviewed. No pertinent past medical history. No current outpatient prescriptions on file.   No current facility-administered medications for this visit.   Family History  Problem Relation Age of Onset  . Cancer Mother   . Heart disease Father   . Stroke Sister   . Heart disease Brother    History   Social History  . Marital Status: Single    Spouse Name: N/A    Number of Children: N/A  . Years of Education: N/A   Social History Main Topics  . Smoking status: Current Every Day Smoker -- 0.50 packs/day for 40 years    Types: Cigarettes  . Smokeless tobacco: None  . Alcohol Use: 50.4 oz/week    84 Cans of beer per week     Comment: Beer  . Drug Use: No  . Sexual Activity: Yes    Birth Control/ Protection: Condom   Other Topics Concern  . None   Social History Narrative  . None   Review of Systems: Pertinent items are noted in HPI. Objective:  Physical Exam: Filed Vitals:   11/13/13 0923  BP: 217/123  Pulse: 90  Temp: 97.4 F (36.3 C)  TempSrc: Oral  Height: 5\' 7"  (1.702 m)  Weight: 120 lb 4.8 oz (54.568 kg)  SpO2: 98%   Constitutional: Vital signs reviewed.  Patient is a very thin appearing caucasian man, well-developed and well-nourished and is in no acute distress and cooperative with exam.    Alert and oriented x3.  Head: Normocephalic and atraumatic Nose: No erythema or drainage noted.  Turbinates normal Mouth: no erythema or exudates, MMM. Poor dentition noted. Eyes: Pupils round and equal, reacting to light, EOMI, conjunctivae normal, No scleral icterus.  Neck: Supple, No carotid bruit present.  Cardiovascular: RRR, S1 normal, loud S2 heard which is prominent in the aortic area, no MRG, pulses symmetric and intact bilaterally Pulmonary/Chest: normal respiratory effort, CTAB, no wheezes, rales, or rhonchi Abdominal: Soft. Non-tender, non-distended, bowel sounds are normal, no masses, organomegaly, or guarding present.  Musculoskeletal: Small pea sized hard protrusion noted over the upper anterior aspect of the lower leg. Extremities: No pedal edema. Neurological: A&O x3, Strength is normal and symmetric bilaterally, cranial nerve II-XII are grossly intact, no focal motor deficit, sensory intact to light touch bilaterally.  Skin: Warm, dry and intact.  Psychiatric: Normal mood and affect.  Assessment & Plan:

## 2013-11-13 NOTE — Assessment & Plan Note (Signed)
Asymptomatic, severe hypertension in a treatment naive patient with no known end organ damage. Patients repeat manual BP in the clinic are 234/134. Patients repeat BP are 246/143 (left arm), 249/136(right arm). Discussed with the attending regarding further management and plan.  Plans: Patient wants to be admitted to the hospital for better control of his BP as he has strong family history of CVA, MI. (Patients younger sister had two strokes, patient brother had an MI and his father died of MI in his 36's) After discussing with the attending, in the best interests of the patient, we decided to admit him to telemetry for observation. As the patient is asymptomatic, rapid reduction in BP is not recommended or ideal. We thought administration of immediate release Clonidine 0.1 mg with a consideration for a repeat dose every one hour as needed (up to a maximum of 0.7mg /day) with a goal BP reduction of 20%-25% of the his highest BP (249/136) - which would be close to SBP 190-200 and DBP 100-110.  We also thought administration of Norvasc or ACE-I along with Clonidine would be ideal for the long term management. 12 lead ECG in the office revealed Age undetermined Anteroseptal infarct. Rest of the investigations and management is per Admitting team.

## 2013-11-13 NOTE — Progress Notes (Signed)
Report called to [facility] on 3W - pt transported via w/c.

## 2013-11-14 DIAGNOSIS — Z87891 Personal history of nicotine dependence: Secondary | ICD-10-CM

## 2013-11-14 DIAGNOSIS — D7589 Other specified diseases of blood and blood-forming organs: Secondary | ICD-10-CM

## 2013-11-14 LAB — BASIC METABOLIC PANEL
BUN: 10 mg/dL (ref 6–23)
CALCIUM: 9.5 mg/dL (ref 8.4–10.5)
CO2: 29 mEq/L (ref 19–32)
Chloride: 99 mEq/L (ref 96–112)
Creatinine, Ser: 1 mg/dL (ref 0.50–1.35)
GFR calc Af Amer: >90 mL/min (ref >90)
GFR, EST NON AFRICAN AMERICAN: 82 mL/min — AB (ref >90)
Glucose, Bld: 97 mg/dL (ref 70–99)
POTASSIUM: 4.2 meq/L (ref 3.7–5.3)
Sodium: 139 mEq/L (ref 137–147)

## 2013-11-14 LAB — FOLATE: Folate: >20 ng/mL

## 2013-11-14 LAB — VITAMIN B12: Vitamin B-12: 493 pg/mL (ref 211–911)

## 2013-11-14 LAB — LIPID PANEL
Cholesterol: 198 mg/dL (ref 0–200)
HDL: 100 mg/dL (ref >39)
LDL CALC: 81 mg/dL (ref 0–99)
Total CHOL/HDL Ratio: 2 RATIO
Triglycerides: 87 mg/dL (ref <150)
VLDL: 17 mg/dL (ref 0–40)

## 2013-11-14 LAB — PHOSPHORUS: Phosphorus: 5 mg/dL — ABNORMAL HIGH (ref 2.3–4.6)

## 2013-11-14 LAB — TROPONIN I

## 2013-11-14 LAB — MAGNESIUM: MAGNESIUM: 1.9 mg/dL (ref 1.5–2.5)

## 2013-11-14 MED ORDER — AMLODIPINE BESYLATE 5 MG PO TABS
5.0000 mg | ORAL_TABLET | Freq: Every day | ORAL | Status: DC
  Filled 2013-11-14: qty 1

## 2013-11-14 MED ORDER — LISINOPRIL-HYDROCHLOROTHIAZIDE 10-12.5 MG PO TABS: 1.0000 | ORAL_TABLET | Freq: Every day | ORAL | Status: DC

## 2013-11-14 MED ORDER — LISINOPRIL 20 MG PO TABS
20.0000 mg | ORAL_TABLET | Freq: Every day | ORAL | Status: DC
  Administered 2013-11-14: 20 mg via ORAL
  Filled 2013-11-14: qty 1

## 2013-11-14 MED ORDER — HYDROCHLOROTHIAZIDE 25 MG PO TABS: 25.0000 mg | ORAL_TABLET | Freq: Every day | ORAL | Status: DC

## 2013-11-14 NOTE — Progress Notes (Signed)
Agree with student Dr. Nelson Chimes note see Dr. Kathreen Devoid note for further details added ACEI for more BP control will d/c with Lisinopril 10-HCTZ 12.5 mg   Shirlee Latch MD

## 2013-11-14 NOTE — Progress Notes (Signed)
  Subjective: Patient has no complaints today, wants to go home. Eating and drinking well. Objective: Vital signs in last 24 hours: Filed Vitals:   11/13/13 1748 11/13/13 1838 11/13/13 2037 11/14/13 0450  BP: 173/100 140/82 139/88 115/97  Pulse: 82 76 68 72  Temp:   98 F (36.7 C) 97.5 F (36.4 C)  TempSrc:   Oral Oral  Resp:   18 19  Weight:    51.982 kg (114 lb 9.6 oz)  SpO2:   98% 98%   Weight change:   Intake/Output Summary (Last 24 hours) at 11/14/13 1224 Last data filed at 11/14/13 0900  Gross per 24 hour  Intake    240 ml  Output      0 ml  Net    240 ml   EXAM: Not in any distress HEENT: AT/, PERRL, EOMI NECK: supple, no JVD, no adenomegaly, or thyroidomegaly LUNG: clear B/L HEART: RRR, S1 and S2 no MRG ABD: S/NT/ND/ BS + EXT: No C/E, PP 2+ B/L Lab Results: @LABTEST2 @ Micro Results: No results found for this or any previous visit (from the past 240 hour(s)). Studies/Results: No results found. Medications: medication reviewed Scheduled Meds: . folic acid  1 mg Oral Daily  . hydrochlorothiazide  25 mg Oral Daily  . multivitamin with minerals  1 tablet Oral Daily  . pneumococcal 23 valent vaccine  0.5 mL Intramuscular Tomorrow-1000  . sodium chloride  3 mL Intravenous Q12H  . sodium chloride  3 mL Intravenous Q12H  . thiamine  100 mg Oral Daily   Or  . thiamine  100 mg Intravenous Daily   Continuous Infusions:  PRN Meds:.sodium chloride, LORazepam, LORazepam, sodium chloride Assessment/Plan: HYPERTENSIVE URGENCY: His BP normalizes just after one dose of clonidine 0.1 mg, his BP today on R arm was 142/94. And L arm was 150/92. His work up for any end organ damage is normal, including BMP, CBC, , ECHO and CXR. Can be discharged with  HTZC 25 mg QD with f/u at clinic to further titrate his need for anti HTN .His EKG shows a possible anteroseptal infarct of  Undetermined age, and patient is completely unaware of that, need to be f/u with cardiology for further  work up.  This is a Psychologist, occupational Note.  The care of the patient was discussed with Dr. Delane Ginger and the assessment and plan formulated with their assistance.  Please see their attached note for official documentation of the daily encounter.   LOS: 1 day   Arnetha Courser, Med Student 11/14/2013, 12:24 PM

## 2013-11-14 NOTE — H&P (Signed)
I repeated the critical or key portions of the exam.  I confirmed/revised the medical student's history, exam, assessment and plan.  Please see my note for further details.  

## 2013-11-14 NOTE — Progress Notes (Signed)
Internal Medicine Attending  Date: 11/14/2013  Patient name: GRANDERSON FLADGER Medical record number: [id number] Date of birth: 21-Jul-1955 Age: 58 y.o. Gender: male  I saw and evaluated the patient. I discussed patient and reviewed the resident's note by Dr. Delane Ginger, and I agree with the resident's findings and plans as documented in her note, with the following additional comments.  Patient's blood pressure is much better controlled, and he remains completely asymptomatic without complaints.  A 2-D echocardiogram showed normal left ventricular systolic function and normal wall motion with no regional wall motion abnormalities.  Agree with plan to discharge home today on oral anti-hypertensive medication (thiazide diuretic or low-dose thiazide/ACE inhibitor combination would be reasonable, given the cost of amlodipine) with followup in outpatient clinic next week.  Patient has mild macrocytosis, and a vitamin B12 and folate level are pending; these can be followed up in clinic.

## 2013-11-14 NOTE — Discharge Instructions (Signed)
Please keep your follow-up appointments; this is very important for your continued recovery.  The internal medicine clinic will call you to schedule this.    We have sent your zestoretic for your blood pressure to [facility].  Please pick this up tomorrow and take 1 tablet daily beginning tomorrow.   Please continue to take all of your medications as prescribed.  Do not miss any doses without contacting your primary physician.  If you have questions, please contact your physician.    Please bring your medicications with you to your appointments; medicications may be eye drops, herbals, vitamins, or pills.    If you believe you are having a life-threatening emergency, go to the nearest Emergency Department.

## 2013-11-14 NOTE — Discharge Summary (Signed)
Name: Stephen Bowen MRN: [id number] DOB: 06/05/1956 58 y.o. PCP: Joni Reining, DO ______________________________________________________  Date of Admission: 11/13/2013  1:46 PM Date of Discharge: 11/14/2013 Attending Physician: Jay Schlichter, MD   Discharge Diagnosis: Hypertensive urgency Macrocytosis without anemia Polysubstance abuse  Discharge Medications:   Medication List         lisinopril-hydrochlorothiazide 10-12.5 MG per tablet  Commonly known as:  ZESTORETIC  Take 1 tablet by mouth daily.        Disposition and follow-up:   Stephen Bowen was discharged from [facility] in good condition to home.    Please address the following problems post discharge:  1. BP control and compliance with zestoretic  2. Polysubstance abuse: encourage smoking cessation and decrease alcohol use 3. Health maintenance  Labs / imaging needed at time of follow-up: BMP  Pending labs/ test needing follow-up: None  Follow-up Appointments: Follow-up Information   Schedule an appointment as soon as possible for a visit with Jenner.   Contact information:   [address] [address] [phone]      Follow up with No PCP Per Patient In 1 week.   Specialty:  General Practice      Discharge Instructions: Discharge Instructions   Call MD for:  extreme fatigue    Complete by:  As directed      Diet - low sodium heart healthy    Complete by:  As directed      Increase activity slowly    Complete by:  As directed            Consultations:   None  Procedures Performed:  No results found.  2D Echo:  Date: 11/13/2013 Study Conclusions - Left ventricle: The cavity size was normal. Systolic function was normal. Wall motion was normal; there were no regional wall motion abnormalities. - Atrial septum: No defect or patent foramen ovale was identified.  Cardiac Cath:  N/A  Admission HPI:  Stephen Bowen is a 58 y.o. male who has a past medical history of HTN but no other known history. Pt presents to the [facility] to establish care with hypertension with BP of 217/213. He denies any fever, HA, changes in vision, dizziness, CP, diaphoresis, palpitations, SOB, abdominal pain, N/V/D/C, or urinary symptoms. He denies any OTC medications (including cold medicines), NSAID use, or other recreational drug use (many years ago). He admits to drinking a one tall beer every 2-3 days. He has smoked cigarettes for most of his adult life and currently smokes about 3 cig/day. 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 There is a FH of CVA and heart disease.  EKG shows Q waves in the precordial leads.   Hospital Course by problem list: Principal Problem:   Hypertensive urgency   Hypertensive urgency  Pt presented to [facility] with asymptomatic elevated BP. No evidence of end organ damage. UDS was negative. Target BP goal should be lowered to <160/<177mHg with MAP not lowered by more than 25-30% over several hours. Should be cautious in this setting as cerebral or myocardial ischemia/infarction can be induced by aggressive antihypertensive therapy. He was admited to telemetry for observation and given 0.16mclonidine with swift reduction in BP.  BP dropped to 140/82 with 0.38m59mlonidine.  Trops x 3 neg. Echo shows normal systolic function. However, pt has large q waves in precordial leads and risk stratification is in order. HA1c: 5.5. Lipid panel: LDL 81. Electrolytes stable.  BP essentially equal in both arms and completely asymptomatic so aortic dissection extremely unlikely.  He was discharged in good condition with  lisinopril 10/HCTZ 12.31m daily.  [facility].    Macrocytosis without anemia Folate, Vit B12 wnl.  Follow-up as outpatient.   Substance abuse  Pt has a h/o alcohol and tobacco use. Mg is wnl. Phos is slightly elevated.  Initially on CIWA protocol with MVI, thiamine 1631SH daily, folic acid 136mdaily.    Discharge Vitals:   BP 103/70  Pulse 79  Temp(Src) 98.2 F (36.8 C) (Oral)  Resp 18  Wt 114 lb 9.6 oz (51.982 kg)  SpO2 99%  Discharge Labs:  Recent Results (from the past 2160 hour(s))  COMPLETE METABOLIC PANEL WITH GFR     Status: None   Collection Time    11/13/13 10:31 AM      Result Value Ref Range   Sodium 139  135 - 145 mEq/L   Potassium 5.3  3.5 - 5.3 mEq/L   Chloride 100  96 - 112 mEq/L   CO2 28  19 - 32 mEq/L   Glucose, Bld 95  70 - 99 mg/dL   BUN 6  6 - 23 mg/dL   Creat 0.92  0.50 - 1.35 mg/dL   Total Bilirubin 0.5  0.3 - 1.2 mg/dL   Alkaline Phosphatase 58  39 - 117 U/L   AST 17  0 - 37 U/L   ALT 20  0 - 53 U/L   Total Protein 7.5  6.0 - 8.3 g/dL   Albumin 3.9  3.5 - 5.2 g/dL   Calcium 9.3  8.4 - 10.5 mg/dL   GFR, Est African American >89     GFR, Est Non African American >89     Comment:       The estimated GFR is a calculation valid for adults (>=1870ears old)     that uses the CKD-EPI algorithm to adjust for age and sex. It is       not to be used for children, pregnant women, hospitalized patients,        patients on dialysis, or with rapidly changing kidney function.     According to the NKDEP, eGFR >89 is normal, 60-89 shows mild     impairment, 30-59 shows moderate impairment, 15-29 shows severe     impairment and <15 is ESRD.        CBC WITH DIFFERENTIAL     Status: Abnormal   Collection Time    11/13/13 10:31 AM      Result Value Ref Range   WBC 5.7  4.0 - 10.5 K/uL   RBC 4.36  4.22 - 5.81 MIL/uL   Hemoglobin 14.7  13.0 - 17.0 g/dL   HCT 44.4  39.0 - 52.0 %   MCV 101.8 (*) 78.0 - 100.0 fL   MCH 33.7  26.0 - 34.0 pg   MCHC 33.1  30.0 - 36.0 g/dL   RDW 13.9  11.5 - 15.5 %   Platelets 338  150 - 400 K/uL   Neutrophils Relative % 70  43 - 77 %   Neutro Abs 4.0  1.7 - 7.7 K/uL   Lymphocytes Relative 15  12 - 46 %   Lymphs Abs 0.9  0.7 - 4.0 K/uL   Monocytes Relative 10  3 - 12 %   Monocytes Absolute 0.6  0.1 -  1.0 K/uL   Eosinophils Relative 4  0 - 5 %   Eosinophils Absolute 0.2  0.0 - 0.7 K/uL   Basophils Relative 1  0 - 1 %   Basophils Absolute 0.1  0.0 - 0.1 K/uL   Smear Review Criteria for review not met    TROPONIN I     Status: None   Collection Time    11/13/13  4:20 PM      Result Value Ref Range   Troponin I <0.30  <0.30 ng/mL   Comment:            Due to the release kinetics of cTnI,     a negative result within the first hours     of the onset of symptoms does not rule out     myocardial infarction with certainty.     If myocardial infarction is still suspected,     repeat the test at appropriate intervals.  HEMOGLOBIN A1C     Status: None   Collection Time    11/13/13  4:20 PM      Result Value Ref Range   Hemoglobin A1C 5.5  <5.7 %   Comment: (NOTE)                                                                               According to the ADA Clinical Practice Recommendations for 2011, when     HbA1c is used as a screening test:      >=6.5%   Diagnostic of Diabetes Mellitus               (if abnormal result is confirmed)     5.7-6.4%   Increased risk of developing Diabetes Mellitus     References:Diagnosis and Classification of Diabetes Mellitus,Diabetes     IOEV,0350,09(FGHWE 1):S62-S69 and Standards of Medical Care in             Diabetes - 2011,Diabetes XHBZ,1696,78 (Suppl 1):S11-S61.   Mean Plasma Glucose 111  <117 mg/dL   Comment: Performed at [facility], ROUTINE W REFLEX MICROSCOPIC     Status: Abnormal   Collection Time    11/13/13  4:34 PM      Result Value Ref Range   Color, Urine YELLOW  YELLOW   APPearance CLEAR  CLEAR   Specific Gravity, Urine 1.009  1.005 - 1.030   pH 7.5  5.0 - 8.0   Glucose, UA NEGATIVE  NEGATIVE mg/dL   Hgb urine dipstick SMALL (*) NEGATIVE   Bilirubin Urine NEGATIVE  NEGATIVE   Ketones, ur NEGATIVE  NEGATIVE mg/dL   Protein, ur NEGATIVE  NEGATIVE mg/dL   Urobilinogen, UA 0.2  0.0 - 1.0 mg/dL   Nitrite  NEGATIVE  NEGATIVE   Leukocytes, UA NEGATIVE  NEGATIVE  URINE RAPID DRUG SCREEN (HOSP PERFORMED)     Status: None   Collection Time    11/13/13  4:34 PM      Result Value Ref Range   Opiates NONE DETECTED  NONE DETECTED   Cocaine NONE DETECTED  NONE DETECTED   Benzodiazepines NONE DETECTED  NONE DETECTED   Amphetamines NONE DETECTED  NONE DETECTED   Tetrahydrocannabinol NONE DETECTED  NONE DETECTED   Barbiturates NONE DETECTED  NONE DETECTED   Comment:            DRUG SCREEN FOR MEDICAL PURPOSES     ONLY.  IF CONFIRMATION IS NEEDED     FOR ANY PURPOSE, NOTIFY LAB     WITHIN 5 DAYS.                LOWEST DETECTABLE LIMITS     FOR URINE DRUG SCREEN     Drug Class       Cutoff (ng/mL)     Amphetamine      1000     Barbiturate      200     Benzodiazepine   671     Tricyclics       245     Opiates          300     Cocaine          300     THC              50  URINE MICROSCOPIC-ADD ON     Status: None   Collection Time    11/13/13  4:34 PM      Result Value Ref Range   WBC, UA 0-2  <3 WBC/hpf   RBC / HPF 0-2  <3 RBC/hpf   Bacteria, UA RARE  RARE  TROPONIN I     Status: None   Collection Time    11/13/13  9:19 PM      Result Value Ref Range   Troponin I <0.30  <0.30 ng/mL   Comment:            Due to the release kinetics of cTnI,     a negative result within the first hours     of the onset of symptoms does not rule out     myocardial infarction with certainty.     If myocardial infarction is still suspected,     repeat the test at appropriate intervals.  TROPONIN I     Status: None   Collection Time    11/14/13  3:52 AM      Result Value Ref Range   Troponin I <0.30  <0.30 ng/mL   Comment:            Due to the release kinetics of cTnI,     a negative result within the first hours     of the onset of symptoms does not rule out     myocardial infarction with certainty.     If myocardial infarction is still suspected,     repeat the test at appropriate intervals.    BASIC METABOLIC PANEL     Status: Abnormal   Collection Time    11/14/13  3:52 AM      Result Value Ref Range   Sodium 139  137 - 147 mEq/L   Potassium 4.2  3.7 - 5.3 mEq/L   Chloride 99  96 - 112 mEq/L   CO2 29  19 - 32 mEq/L   Glucose, Bld 97  70 - 99 mg/dL   BUN 10  6 - 23 mg/dL   Creatinine, Ser 1.00  0.50 - 1.35 mg/dL   Calcium 9.5  8.4 - 10.5 mg/dL   GFR calc non Af Amer 82 (*) >90 mL/min   GFR calc Af Amer >90  >90 mL/min   Comment: (NOTE)     The eGFR has been calculated using the CKD EPI equation.  This calculation has not been validated in all clinical situations.     eGFR's persistently <90 mL/min signify possible Chronic Kidney     Disease.  LIPID PANEL     Status: None   Collection Time    11/14/13  3:52 AM      Result Value Ref Range   Cholesterol 198  0 - 200 mg/dL   Triglycerides 87  <150 mg/dL   HDL 100  >39 mg/dL   Total CHOL/HDL Ratio 2.0     VLDL 17  0 - 40 mg/dL   LDL Cholesterol 81  0 - 99 mg/dL   Comment:            Total Cholesterol/HDL:CHD Risk     Coronary Heart Disease Risk Table                         Men   Women      1/2 Average Risk   3.4   3.3      Average Risk       5.0   4.4      2 X Average Risk   9.6   7.1      3 X Average Risk  23.4   11.0                Use the calculated Patient Ratio     above and the CHD Risk Table     to determine the patient's CHD Risk.                ATP III CLASSIFICATION (LDL):      <100     mg/dL   Optimal      100-129  mg/dL   Near or Above                        Optimal      130-159  mg/dL   Borderline      160-189  mg/dL   High      >190     mg/dL   Very High  MAGNESIUM     Status: None   Collection Time    11/14/13  3:52 AM      Result Value Ref Range   Magnesium 1.9  1.5 - 2.5 mg/dL  PHOSPHORUS     Status: Abnormal   Collection Time    11/14/13  3:52 AM      Result Value Ref Range   Phosphorus 5.0 (*) 2.3 - 4.6 mg/dL  VITAMIN B12     Status: None   Collection Time    11/14/13  8:10  AM      Result Value Ref Range   Vitamin B-12 493  211 - 911 pg/mL   Comment: Performed at [facility]     Status: None   Collection Time    11/14/13  8:10 AM      Result Value Ref Range   Folate >20.0     Comment: (NOTE)     Reference Ranges            Deficient:       0.4 - 3.3 ng/mL            Indeterminate:   3.4 - 5.4 ng/mL            Normal:              > 5.4  ng/mL     Performed at [facility]: Jones Bales, MD [phone] 11/22/2013, 8:10 PM   Time Spent on Discharge: 75mnutes Services Ordered on Discharge: None Equipment Ordered on Discharge: None

## 2013-11-14 NOTE — Progress Notes (Addendum)
Subjective:   Pt has no new complaints this AM.  Pt is feeling better and did not have any events overnight. VSS.  Denies changes in vision, dizziness, CP, SOB, or palpitations.  Pt ate breakfast and tolerated BP meds.  BP in both arms: L arm: 150/92, R arm: 142/94.     Objective:   Vital signs in last 24 hours: Filed Vitals:   11/13/13 1748 11/13/13 1838 11/13/13 2037 11/14/13 0450  BP: 173/100 140/82 139/88 115/97  Pulse: 82 76 68 72  Temp:   98 F (36.7 C) 97.5 F (36.4 C)  TempSrc:   Oral Oral  Resp:   18 19  Weight:    114 lb 9.6 oz (51.982 kg)  SpO2:   98% 98%    Weight: Filed Weights   11/14/13 0450  Weight: 114 lb 9.6 oz (51.982 kg)    Ins/Outs:  Intake/Output Summary (Last 24 hours) at 11/14/13 1247 Last data filed at 11/14/13 0900  Gross per 24 hour  Intake    240 ml  Output      0 ml  Net    240 ml    Physical Exam: Constitutional: Vital signs reviewed.  Patient is sitting up on the side of the bed in good spirits and cooperative with exam.   HEENT: [place]/AT; PERRL, EOMI, conjunctivae normal, no scleral icterus  Cardiovascular: RRR, no MRG Pulmonary/Chest: normal respiratory effort, no accessory muscle use, CTAB, no wheezes, rales, or rhonchi Abdominal: Soft. +BS, NT/ND Neurological: A&O x3, CN II_XII grossly intact; non-focal exam Extremities: 2+DP b/l, no C/C/E  Skin: Warm, dry and intact. No rash  Lab Results:  BMP:  Recent Labs Lab 11/13/13 1031 11/14/13 0352  NA 139 139  K 5.3 4.2  CL 100 99  CO2 28 29  GLUCOSE 95 97  BUN 6 10  CREATININE 0.92 1.00  CALCIUM 9.3 9.5  MG  --  1.9  PHOS  --  5.0*   Anion Gap:  11  CBC:  Recent Labs Lab 11/13/13 1031  WBC 5.7  NEUTROABS 4.0  HGB 14.7  HCT 44.4  MCV 101.8*  PLT 338    Coagulation: No results found for this basename: LABPROT, INR,  in the last 168 hours  CBG:           No results found for this basename: GLUCAP,  in the last 168 hours         HA1C:       Recent  Labs Lab 11/13/13 1620  HGBA1C 5.5    Lipid Panel:  Recent Labs Lab 11/14/13 0352  CHOL 198  HDL 100  LDLCALC 81  TRIG 87  CHOLHDL 2.0    LFTs:  Recent Labs Lab 11/13/13 1031  AST 17  ALT 20  ALKPHOS 58  BILITOT 0.5  PROT 7.5  ALBUMIN 3.9    Pancreatic Enzymes: No results found for this basename: LIPASE, AMYLASE,  in the last 168 hours  Lactic Acid/Procalcitonin: No results found for this basename: LATICACIDVEN, PROCALCITON,  in the last 168 hours  Ammonia: No results found for this basename: AMMONIA,  in the last 168 hours  Cardiac Enzymes:  Recent Labs Lab 11/13/13 1620 11/13/13 2119 11/14/13 0352  TROPONINI <0.30 <0.30 <0.30    EKG: EKG Interpretation  Date/Time:    Ventricular Rate:    PR Interval:    QRS Duration:   QT Interval:    QTC Calculation:   R Axis:     Text  Interpretation:     BNP: No results found for this basename: PROBNP,  in the last 168 hours  D-Dimer: No results found for this basename: DDIMER,  in the last 168 hours  Urinalysis:  Recent Labs Lab 11/13/13 1634  COLORURINE YELLOW  LABSPEC 1.009  PHURINE 7.5  GLUCOSEU NEGATIVE  HGBUR SMALL*  BILIRUBINUR NEGATIVE  KETONESUR NEGATIVE  PROTEINUR NEGATIVE  UROBILINOGEN 0.2  NITRITE NEGATIVE  LEUKOCYTESUR NEGATIVE    Micro Results: No results found for this or any previous visit (from the past 240 hour(s)).  Blood Culture: No results found for this basename: sdes,  specrequest,  cult,  reptstatus    Studies/Results: No results found.  Medications:  Scheduled Meds: . folic acid  1 mg Oral Daily  . hydrochlorothiazide  25 mg Oral Daily  . multivitamin with minerals  1 tablet Oral Daily  . sodium chloride  3 mL Intravenous Q12H  . sodium chloride  3 mL Intravenous Q12H  . thiamine  100 mg Oral Daily   Or  . thiamine  100 mg Intravenous Daily   Continuous Infusions:  PRN Meds: sodium chloride, LORazepam, LORazepam, sodium  chloride  Antibiotics: Antibiotics Given (last 72 hours)   None      Day of Hospitalization: 1  Consults:    Assessment/Plan:   Principal Problem:   Hypertensive urgency  Hypertensive urgency  Resolved.  [facility]. No evidence of end organ damage. UDS was negative.  Admitted to telemetry for obs.  Goal BP: <140/90.  BP dropped to 140/82 with 0.1mg  clonidine yesterday.  Trops x 3 neg.  Echo today shows normal systolic function.  However, pt has large q waves in precordial leads and risk stratification is in order.  HA1c: 5.5.  Lipid panel: LDL 81.  Electrolytes stable.  BP essentially equal in both arms and completely asymptomatic so aortic dissection extremely unlikely.  -continue lisinopril 10/HCTZ 12.5mg  daily -should f/u with cardiology as outpatient   Substance abuse  Pt has a h/o alcohol and tobacco use.  Mg is wnl.  Phos is slightly elevated.   -CIWA protocol  -MVI, thiamine 100mg  daily, folic acid 1mg  daily   FEN  Fluids-None  Electrolytes-Replete as needed  Nutrition-Heart healthy   Disposition Anticipated discharge today.     LOS: 1 day   Marrian SalvageJacquelyn S Jerry Clyne, MD PGY-1, Internal Medicine Teaching Service [phone] (7AM-5PM Mon-Fri) 11/14/2013, 12:47 PM

## 2013-11-16 NOTE — Progress Notes (Signed)
CARE MANAGEMENT NOTE 11/16/2013  Patient:  Stephen Bowen, Stephen Bowen   Account Number:  [id number]  Date Initiated:  11/16/2013  Documentation initiated by:  [facility]  Subjective/Objective Assessment:   Hypertension     Action/Plan:   Anticipated DC Date:  11/16/2013   Anticipated DC Plan:  HOME/SELF CARE      DC Planning Services  CM consult      Choice offered to / List presented to:             Status of service:  Completed, signed off Medicare Important Message given?   (If response is "NO", the following Medicare IM given date fields will be blank) Date Medicare IM given:   Date Additional Medicare IM given:    Discharge Disposition:  HOME/SELF CARE  Per UR Regulation:    If discussed at Long Length of Stay Meetings, dates discussed:    Comments:  11/16/2013 1245 NCM contacted pt at home. States he has not picked up his medication from pharmacy but plans to get today. States he can afford medication. NCM explained the importance of compliance with medical recommendation to prevent complications. Pt verbalized understanding and states he will pick up today. Isidoro Donning RN CCM Case Mgmt phone [phone]

## 2013-11-27 ENCOUNTER — Ambulatory Visit: Payer: Self-pay | Admitting: Internal Medicine

## 2014-05-03 ENCOUNTER — Encounter: Payer: Self-pay | Admitting: Internal Medicine

## 2014-07-21 ENCOUNTER — Encounter: Payer: Self-pay | Admitting: Internal Medicine

## 2014-09-28 ENCOUNTER — Telehealth: Payer: Self-pay | Admitting: Internal Medicine

## 2014-09-28 NOTE — Telephone Encounter (Signed)
Called patient this am about scheduling his HM visit.  He declined an appt and wishes we remove our name as his PCP.

## 2015-04-30 ENCOUNTER — Encounter (HOSPITAL_COMMUNITY): Payer: Self-pay | Admitting: *Deleted

## 2015-04-30 ENCOUNTER — Emergency Department (HOSPITAL_COMMUNITY)
Admission: EM | Admit: 2015-04-30 | Discharge: 2015-04-30 | Disposition: A | Discharge status: Home / Self Care | Payer: Self-pay | Attending: Emergency Medicine | Admitting: Emergency Medicine

## 2015-04-30 DIAGNOSIS — M25512 Pain in left shoulder: Secondary | ICD-10-CM | POA: Insufficient documentation

## 2015-04-30 DIAGNOSIS — Z720 Tobacco use: Secondary | ICD-10-CM | POA: Insufficient documentation

## 2015-04-30 DIAGNOSIS — I10 Essential (primary) hypertension: Secondary | ICD-10-CM | POA: Insufficient documentation

## 2015-04-30 LAB — I-STAT CHEM 8, ED
BUN: 6 mg/dL (ref 6–20)
CALCIUM ION: 1.12 mmol/L (ref 1.12–1.23)
CHLORIDE: 100 mmol/L — AB (ref 101–111)
Creatinine, Ser: 1 mg/dL (ref 0.61–1.24)
Glucose, Bld: 90 mg/dL (ref 65–99)
HEMATOCRIT: 44 % (ref 39.0–52.0)
Hemoglobin: 15 g/dL (ref 13.0–17.0)
POTASSIUM: 3.9 mmol/L (ref 3.5–5.1)
SODIUM: 140 mmol/L (ref 135–145)
TCO2: 25 mmol/L (ref 0–100)

## 2015-04-30 MED ORDER — MELOXICAM 7.5 MG PO TABS: 7.5000 mg | ORAL_TABLET | Freq: Every day | ORAL | Status: DC | PRN

## 2015-04-30 MED ORDER — HYDROCHLOROTHIAZIDE 25 MG PO TABS: 25.0000 mg | ORAL_TABLET | Freq: Every day | ORAL | Status: DC

## 2015-04-30 MED ORDER — MELOXICAM 7.5 MG PO TABS
7.5000 mg | ORAL_TABLET | Freq: Once | ORAL | Status: AC
  Administered 2015-04-30: 7.5 mg via ORAL
  Filled 2015-04-30: qty 1

## 2015-04-30 MED ORDER — HYDROCHLOROTHIAZIDE 25 MG PO TABS
25.0000 mg | ORAL_TABLET | Freq: Every day | ORAL | Status: DC
  Administered 2015-04-30: 25 mg via ORAL
  Filled 2015-04-30: qty 1

## 2015-04-30 NOTE — ED Provider Notes (Signed)
CSN: [id number]     Arrival date & time 04/30/15  1258 History   First MD Initiated Contact with Patient 04/30/15 1327     Chief Complaint  Patient presents with  . Shoulder Pain  . Hypertension     (Consider location/radiation/quality/duration/timing/severity/associated sxs/prior Treatment) The history is provided by the patient.     Pt with hx HTN not on medication, p/w left shoulder pain x 2 months.  Pain is present over the top of the shoulder and is worse with abduction, worse at night when it "tightens up."  Pt with right handed.  Does yard work for a living.  Denies any injury.  Denies weakness or numbness of the arm.    Pt has known HTN.  Last seen by PCP 1 year ago.  Was given Rx for for antihypertensives but it cost $60 and he could not afford it.  Denies HA, CP, SOB, vision changes, confusion.    Past Medical History  Diagnosis Date  . HTN (hypertension)    Past Surgical History  Procedure Laterality Date  . 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   Family History  Problem Relation Age of Onset  . Cancer Mother     deceased in 32s   . Heart disease Father   . Stroke Sister     age 78s  . Heart disease Brother     pacemaker in his 23s  . Heart attack Mother     diseased   Social History  Substance Use Topics  . Smoking status: Current Every Day Smoker -- 0.50 packs/day for 40 years    Types: Cigarettes  . Smokeless tobacco: Never Used  . Alcohol Use: 8.4 oz/week    14 Cans of beer per week    Review of Systems  All other systems reviewed and are negative.     Allergies  Review of patient's allergies indicates no known allergies.  Home Medications   Prior to Admission medications   Medication Sig Start Date End Date Taking? Authorizing Provider  lisinopril-hydrochlorothiazide (ZESTORETIC) 10-12.5 MG per tablet Take 1 tablet by mouth daily. Patient not taking: Reported on 04/30/2015 11/14/13   Marrian Salvage, MD   BP 211/122 mmHg  Pulse 87  Temp(Src) 98.7 F (37.1 C) (Oral)  Resp 16  Ht  (1.702 m)  Wt 122 lb 14.4 oz (55.747 kg)  BMI 19.24 kg/m2  SpO2 100% Physical Exam  Constitutional: He appears well-developed and well-nourished. No distress.  HENT:  Head: Normocephalic and atraumatic.  Neck: Neck supple.  Cardiovascular: Normal rate and regular rhythm.   Pulmonary/Chest: Effort normal and breath sounds normal. No respiratory distress. He has no wheezes. He has no rales.  Musculoskeletal:       Left shoulder: Normal.  Left shoulder pain with external rotation and with abduction past 90 degrees.  Nontender to palpation.  Strength 5/5, sensation intact throughout LUE, distal pulses intact.    Neurological: He is alert.  Skin: He is not diaphoretic.  Nursing note and vitals reviewed.   ED Course  Procedures (including critical care time) Labs Review Labs Reviewed  I-STAT CHEM 8, ED - Abnormal; Notable for the following:    Chloride 100 (*)    All other components within normal limits    Imaging Review No results found. I have personally reviewed and evaluated these images and lab results as part of my medical decision-making.   EKG Interpretation  None      MDM   Final diagnoses:  Left shoulder pain  Essential hypertension    Afebrile, nontoxic patient with 2 months of left shoulder pain without injury.  Neurovascularly intact.  No known injury.  Likely chronic wear and tear from job doing yard work.  No bony tenderness.  Doubt fracture or dislocation.  Doubt radiculopathy.  Pt also found to be hypertensive.  He is asymptomatic and kidney function is normal.  Doubt hypertensive emergency.  Pt started on antihypertensives in ED. Discussed importance of controlling blood pressure with patient and advised close follow up with PCP.   D/C home with HCTZ, mobic, resources for PCP follow up.  Discussed result, findings, treatment, and follow up  with patient.  Pt given  return precautions.  Pt verbalizes understanding and agrees with plan.         Trixie Dredgemily Chandrika Sandles, PA-C 04/30/15 1610  Mirian MoMatthew Gentry, MD 05/05/15 1415

## 2015-04-30 NOTE — Discharge Instructions (Signed)
Read the information below.  Use the prescribed medication as directed.  Please discuss all new medications with your pharmacist.  You may return to the Emergency Department at any time for worsening condition or any new symptoms that concern you.  If you develop uncontrolled pain, weakness or numbness of the extremity, severe discoloration of the skin, or you are unable to move your arm, return to the ER for a recheck.     It is very important that you get your blood pressure rechecked within the week.  Please make an appointment with your primary care provider for blood pressure recheck and to adjust your medications, for further management of your shoulder pain.  If you cannot find the record of your primary care provider's information, please use the resources below for primary care.      Hypertension Hypertension is another name for high blood pressure. High blood pressure forces your heart to work harder to pump blood. A blood pressure reading has two numbers, which includes a higher number over a lower number (example: 110/72). HOME CARE   Have your blood pressure rechecked by your doctor.  Only take medicine as told by your doctor. Follow the directions carefully. The medicine does not work as well if you skip doses. Skipping doses also puts you at risk for problems.  Do not smoke.  Monitor your blood pressure at home as told by your doctor. GET HELP IF:  You think you are having a reaction to the medicine you are taking.  You have repeat headaches or feel dizzy.  You have puffiness (swelling) in your ankles.  You have trouble with your vision. GET HELP RIGHT AWAY IF:   You get a very bad headache and are confused.  You feel weak, numb, or faint.  You get chest or belly (abdominal) pain.  You throw up (vomit).  You cannot breathe very well. MAKE SURE YOU:   Understand these instructions.  Will watch your condition.  Will get help right away if you are not doing well  or get worse.   This information is not intended to replace advice given to you by your health care provider. Make sure you discuss any questions you have with your health care provider.   Document Released: 11/28/2007 Document Revised: 06/16/2013 Document Reviewed: 04/03/2013 Elsevier Interactive Patient Education 2016 Elsevier Inc.  Shoulder Pain The shoulder is the joint that connects your arm to your body. Muscles and band-like tissues that connect bones to muscles (tendons) hold the joint together. Shoulder pain is felt if an injury or medical problem affects one or more parts of the shoulder. HOME CARE   Put ice on the sore area.  Put ice in a plastic bag.  Place a towel between your skin and the bag.  Leave the ice on for 15-20 minutes, 03-04 times a day for the first 2 days.  Stop using cold packs if they do not help with the pain.  If you were given something to keep your shoulder from moving (sling; shoulder immobilizer), wear it as told. Only take it off to shower or bathe.  Move your arm as little as possible, but keep your hand moving to prevent puffiness (swelling).  Squeeze a soft ball or foam pad as much as possible to help prevent swelling.  Take medicine as told by your doctor. GET HELP IF:  You have progressing new pain in your arm, hand, or fingers.  Your hand or fingers get cold.  Your medicine  does not help lessen your pain. GET HELP RIGHT AWAY IF:   Your arm, hand, or fingers are numb or tingling.  Your arm, hand, or fingers are puffy (swollen), painful, or turn white or blue. MAKE SURE YOU:   Understand these instructions.  Will watch your condition.  Will get help right away if you are not doing well or get worse.   This information is not intended to replace advice given to you by your health care provider. Make sure you discuss any questions you have with your health care provider.   Document Released: 11/28/2007 Document Revised:  07/02/2014 Document Reviewed: 10/04/2014 Elsevier Interactive Patient Education 2016 [employer].    Emergency Department Resource Guide 1) Find a Doctor and Pay Out of Pocket Although you won't have to find out who is covered by your insurance plan, it is a good idea to ask around and get recommendations. You will then need to call the office and see if the doctor you have chosen will accept you as a new patient and what types of options they offer for patients who are self-pay. Some doctors offer discounts or will set up payment plans for their patients who do not have insurance, but you will need to ask so you aren't surprised when you get to your appointment.  2) Contact Your Local Health Department Not all health departments have doctors that can see patients for sick visits, but many do, so it is worth a call to see if yours does. If you don't know where your local health department is, you can check in your phone book. The CDC also has a tool to help you locate your state's health department, and many state websites also have listings of all of their local health departments.  3) Find a Walk-in Clinic If your illness is not likely to be very severe or complicated, you may want to try a walk in clinic. These are popping up all over the country in pharmacies, drugstores, and shopping centers. They're usually staffed by nurse practitioners or physician assistants that have been trained to treat common illnesses and complaints. They're usually fairly quick and inexpensive. However, if you have serious medical issues or chronic medical problems, these are probably not your best option.  No Primary Care Doctor: - Call Health Connect at  [phone] - they can help you locate a primary care doctor that  accepts your insurance, provides certain services, etc. - Physician Referral Service- [phone]  Chronic Pain Problems: Organization         Address  Phone   Notes  [facility]  [phone] Patients need to be referred by their primary care doctor.   Medication Assistance: Organization         Address  Phone   Notes  [facility] Medication [facility] [address] [place], [place] [address] [phone] --Must be a resident of [facility] -- Must have NO insurance coverage whatsoever (no Medicaid/ Medicare, etc.) -- The pt. MUST have a primary care doctor that directs their care regularly and follows them in the community   [facility]  [phone]   [employer]  [phone]    Agencies that provide inexpensive medical care: Organization         Address  Phone   Notes  [facility]  [phone]   [facility]    [phone]   [facility] [phone]  [address] [place], [place] [address] [phone]   [facility] [address] [place] [phone]   Planned Parenthood    [phone]   [facility]    [phone]   [facility]  [address], [place] Phone:  [phone], Fax:  [phone] Hours of Operation:  9 am - 6 pm, M-F.  Also accepts Medicaid/Medicare and self-pay.  [facility]  [address], [place] Phone: [phone], Fax: [phone]. Hours of Operation:  8:30 am - 5:30 pm, M-F.  Also accepts Medicaid and self-pay.  [facility] [place] [address], [place] Phone: [phone]   [facility] [address] Natasha Bence [place], [place] [phone], Ext. [phone] Mondays & Thursdays: 7-9 AM.  First 15 patients are seen on a first come, first serve basis.    Medicaid-accepting [facility] Providers:  Organization         Address  Phone   Notes  [facility] [address], [place] [phone] Also accepts self-pay patients.  [facility] [address] Laurell Josephs [place],  [place]  [phone]   [facility] [address], [place] [phone]   [facility] [address], [place] [phone]   Renaye Rakers [address], [place]   [phone] Only accepts [place] Access [place] patients after they have their name applied to their card.   Self-Pay (no insurance) in [facility]:  Organization         Address  Phone   Notes  Sickle Cell Patients, [facility] [address] [place], [place] [phone]   [facility] [address] [place], [place] [phone]   [facility]  [address], [place] [phone]   [facility]/Dr. Osei-Bonsu  [address], [place] or [address], [place] [phone] Phone number for both [place] and [place] locations is the same.  [facility] and [facility] [address] [place] [phone]   [facility] [address], [place] or [address] [phone]   [facility] [address], [place] [phone], phone; [phone], fax Sees patients 1st and 3rd Saturday of every month.  Must not qualify for public or private insurance (i.e. Medicaid, Medicare, [place] Health Choice, Veterans' Benefits)  Household income should be no more than 200% of the poverty level The clinic cannot treat you if you are pregnant or think you are pregnant  Sexually transmitted diseases are not treated at the clinic.    Dental Care: Organization         Address  Phone  Notes  [facility] Kacey Dysert Department of [facility] [address] [place], [place] [phone] Accepts children up to age 11 who are enrolled in [place] or [place] Health Choice; pregnant women with a Medicaid card; and children who have applied for Medicaid or [place] Health  Choice, but were declined, whose parents can pay a reduced fee at time of service.  [facility] Department of [facility]  [address], [place] [phone] Accepts children up to age 24 who are enrolled in [place] or  [place] Health Choice; pregnant women with a Medicaid card; and children who have applied for Medicaid or [place] Health Choice, but were declined, whose parents can pay a reduced fee at time of service.  [facility]  [address] [place], [place] [phone] Patients are seen by appointment only. Walk-ins are not accepted. [facility] will see patients 58 years of age and older. Monday - Tuesday (8am-5pm) Most Wednesdays (8:30-5pm) $30 per visit, cash only  [facility] Access [facility]  [address], [facility] [phone] Patients are seen by appointment only. Walk-ins are not accepted. [facility] will see patients 28 years of age and older. One Wednesday Evening (Monthly: Volunteer Based).  $30 per visit, cash only  [employer]  [phone] for adults; Children under age 16, call Graduate Pediatric Dentistry at [phone]. Children aged 73-14, please call [phone] to request a pediatric application.  Dental services are provided in all areas of dental care including fillings, crowns and bridges, complete and partial dentures, implants, gum treatment, root canals, and extractions. Preventive care is also provided. Treatment is provided to both adults and children. Patients are selected via a lottery and there is often a waiting list.   [facility] [address] [place]  [phone] [email or web address]   [facility] [address] [place], [place] [phone], Ext. [phone] Second and Fourth Thursday of each month, opens at 6:30 AM; Clinic ends at 9 AM.  Patients are seen on a first-come first-served basis, and a limited number are seen during each  clinic.   [facility]  [address] Ether Griffins [place], [place] [phone]   Eligibility Requirements You must have lived in [place], [place], or [place] counties for at least the last three months.   You cannot be eligible for state or federal sponsored [place], including [employer], [place], or [employer].   You generally cannot be eligible for healthcare insurance through your employer.    How to apply: Eligibility screenings are held every Tuesday and Wednesday afternoon from 1:00 pm until 4:00 pm. You do not need an appointment for the interview!  [facility] [address] [place], [place] [phone]   [facility] Health Department  [phone]   [facility] Department  [phone]   [facility] Health Department  [phone]    Behavioral Health Resources in the Community: Intensive Outpatient Programs Organization         Address  Phone  Notes  [facility] [address], [place], [place] [phone]   [facility] [address], [place], [place] [phone]   [facility] [address], [place], [place]  [phone]   [facility] [address],  [place], [place] [phone] or [phone]   Substance Abuse Resources Organization         Address  Phone  Notes  [facility]  [phone]   [facility]  [phone]   [facility]  [phone]   Floydene Flock  [phone]   Residential & Outpatient Substance Abuse Program  [phone]   Psychological Services Organization         Address  Phone  Notes  [facility]  [phone]   [facility]  [phone]   [facility] Mental Health [address], [place] [phone] or [phone]  Mobile Crisis Teams Organization         Address  Phone  Notes  [facility], Mobile  Crisis Care Unit  [phone]   [facility]  [address] [place], [place] [phone]   Doristine Locks [address] [place] [phone]    Self-Help/Support Groups Organization         Address  Phone             Notes  [facility] [place] - variety of support groups  [phone]- I7437963 Call for more information  Narcotics Anonymous (NA), [facility] [address], [employer] [place]  2 meetings at this location   Statistician         Address  Phone  Notes  [facility] [address],    [place]  [phone]   [facility]  [address] [place] [address], [place], [place] [phone]   [facility] [address] [place], [place] [phone] Admissions: 8am-3pm M-F  [facility] [address],    [place], [place] [phone]   [facility] [address] [place], [place], [place] [phone]   [facility] [address],  [place], [place] [phone]   [facility] - Intensive Outpatient [address], [place], [place] [phone]   [facility] ([facility] [address] [place]  [place], [place] [phone] or [phone]   [facility] ([facility]) [address], [place], [place] [phone] Accepts Medicaid  [facility] [address],  [place] [phone] Substance Abuse/Addiction Treatment   [facility] Organization         Address  Phone  Notes  [facility]  [phone]   Angie Fava, PhD [address] Ervin Knack [place], [place]   [phone] or [phone]   [facility] Behavioral   [address] [place], [place] [phone]   [facility] [address], [place], [place] [phone] Insurance/Medicaid/sponsorship through [facility] [address]                                    [place], [place] [phone] Therapy/tele-psych/case  [facility] [address], [place] [phone]    Dr. Lolly Mustache  [phone]   [facility]  [employer] Care One Dept. 1) [address], [place] 2) [address], [place] 3)  [address], [place] [phone]  [phone]   [facility] Child Abuse Hotline (662)415-1007 or [phone] (After Hours)

## 2015-04-30 NOTE — ED Notes (Signed)
Pt reports left shoulder pain x 2 months, denies injury. Pain increases when lifting his arm, reports it feels fine when working and then becomes stiff at night. Pt is hypertensive at triage, reports hx of same but doesn't take any meds.

## 2015-06-29 ENCOUNTER — Emergency Department (EMERGENCY_DEPARTMENT_HOSPITAL)
Admit: 2015-06-29 | Discharge: 2015-06-29 | Disposition: A | Discharge status: Home / Self Care | Payer: Self-pay | Attending: Emergency Medicine | Admitting: Emergency Medicine

## 2015-06-29 ENCOUNTER — Emergency Department (HOSPITAL_COMMUNITY)
Admission: EM | Admit: 2015-06-29 | Discharge: 2015-06-29 | Disposition: A | Discharge status: Home / Self Care | Payer: Self-pay | Attending: Physician Assistant | Admitting: Physician Assistant

## 2015-06-29 ENCOUNTER — Encounter (HOSPITAL_COMMUNITY): Payer: Self-pay | Admitting: Family Medicine

## 2015-06-29 ENCOUNTER — Emergency Department (EMERGENCY_DEPARTMENT_HOSPITAL)
Admission: EM | Admit: 2015-06-29 | Discharge: 2015-06-29 | Disposition: A | Discharge status: Home / Self Care | Payer: Self-pay | Source: Home / Self Care | Attending: Emergency Medicine | Admitting: Emergency Medicine

## 2015-06-29 DIAGNOSIS — I749 Embolism and thrombosis of unspecified artery: Secondary | ICD-10-CM | POA: Insufficient documentation

## 2015-06-29 DIAGNOSIS — M79661 Pain in right lower leg: Secondary | ICD-10-CM

## 2015-06-29 DIAGNOSIS — I739 Peripheral vascular disease, unspecified: Secondary | ICD-10-CM

## 2015-06-29 DIAGNOSIS — I70211 Atherosclerosis of native arteries of extremities with intermittent claudication, right leg: Secondary | ICD-10-CM

## 2015-06-29 DIAGNOSIS — Z79899 Other long term (current) drug therapy: Secondary | ICD-10-CM | POA: Insufficient documentation

## 2015-06-29 DIAGNOSIS — I7090 Unspecified atherosclerosis: Secondary | ICD-10-CM

## 2015-06-29 DIAGNOSIS — F17210 Nicotine dependence, cigarettes, uncomplicated: Secondary | ICD-10-CM | POA: Insufficient documentation

## 2015-06-29 DIAGNOSIS — I10 Essential (primary) hypertension: Secondary | ICD-10-CM | POA: Insufficient documentation

## 2015-06-29 MED ORDER — METRONIDAZOLE 500 MG PO TABS: 500.0000 mg | ORAL_TABLET | Freq: Two times a day (BID) | ORAL | Status: DC

## 2015-06-29 MED ORDER — LISINOPRIL-HYDROCHLOROTHIAZIDE 10-12.5 MG PO TABS: 1.0000 | ORAL_TABLET | Freq: Every day | ORAL | Status: DC

## 2015-06-29 MED ORDER — HYDROCODONE-ACETAMINOPHEN 5-325 MG PO TABS: 1.0000 | ORAL_TABLET | Freq: Four times a day (QID) | ORAL | Status: DC | PRN

## 2015-06-29 NOTE — Progress Notes (Signed)
Spoke with RN Traci about venous duplex order. Upon talking to patient, he is presenting with claudication symptoms. She ok'd to change order to arterial duplex. Farrel DemarkJill Eunice, RDMS, RVT

## 2015-06-29 NOTE — Consult Note (Signed)
[facility] [place]  Patient name: Stephen ApleyJerome D Miklas MRN: [id number] DOB: Nov 19, 1955 Sex: male  REASON FOR CONSULT: Right leg pain, consult is from ED provider.  HPI: Stephen Bowen is a 60 y.o. male, who presents with a  2 month history of right calf pain. This has progressively worsened. The patient experiences right calf pain after ambulating about a mile. He denies any pain with his left leg. He says that this pain interferes with his daily activity. He denies any rest pain. He denies any history of nonhealing wounds. He is a longtime smoker.  He has a past medical history of hypertension. He denies any history of cardiac disease.  Past Medical History  Diagnosis Date  . HTN (hypertension)     Family History  Problem Relation Age of Onset  . Cancer Mother     deceased in 4070s   . Heart disease Father   . Stroke Sister     age 7450s  . Heart disease Brother     pacemaker in his 1950s  . Heart attack Mother     diseased    SOCIAL HISTORY: Social History   Social History  . Marital Status: Single    Spouse Name: N/A  . Number of Children: N/A  . Years of Education: N/A   Occupational History  . Not on file.   Social History Main Topics  . Smoking status: Current Every Day Smoker -- 0.50 packs/day for 40 years    Types: Cigarettes  . Smokeless tobacco: Never Used  . Alcohol Use: 8.4 oz/week    14 Cans of beer per week  . Drug Use: No  . Sexual Activity: Yes    Birth Control/ Protection: Condom   Other Topics Concern  . Not on file   Social History Narrative   Smoking cigarettes since 60 y.o   Drinks beer 24 oz every other day                        No Known Allergies  No current facility-administered medications for this encounter.   Current Outpatient Prescriptions  Medication Sig Dispense Refill  . hydrochlorothiazide (HYDRODIURIL) 25 MG tablet Take 1 tablet (25 mg total) by mouth daily. 30 tablet 0  .  HYDROcodone-acetaminophen (NORCO/VICODIN) 5-325 MG tablet Take 1-2 tablets by mouth every 6 (six) hours as needed. 20 tablet 0  . lisinopril-hydrochlorothiazide (ZESTORETIC) 10-12.5 MG per tablet Take 1 tablet by mouth daily. (Patient not taking: Reported on 04/30/2015) 30 tablet 1  . meloxicam (MOBIC) 7.5 MG tablet Take 1 tablet (7.5 mg total) by mouth daily as needed for pain. 14 tablet 0    REVIEW OF SYSTEMS:  [X]  denotes positive finding, [ ]  denotes negative finding Cardiac  Comments:  Chest pain or chest pressure:    Shortness of breath upon exertion:    Short of breath when lying flat:    Irregular heart rhythm:        Vascular    Pain in calf, thigh, or hip brought on by ambulation: x   Pain in feet at night that wakes you up from your sleep:     Blood clot in your veins:    Leg swelling:         Pulmonary    Oxygen at home:    Productive cough:     Wheezing:         Neurologic    Sudden weakness in arms  or legs:     Sudden numbness in arms or legs:     Sudden onset of difficulty speaking or slurred speech:    Temporary loss of vision in one eye:     Problems with dizziness:         Gastrointestinal    Blood in stool:     Vomited blood:         Genitourinary    Burning when urinating:     Blood in urine:        Psychiatric    Major depression:         Hematologic    Bleeding problems:    Problems with blood clotting too easily:        Skin    Rashes or ulcers:        Constitutional    Fever or chills:      PHYSICAL EXAM: Filed Vitals:   06/29/15 1310 06/29/15 1619 06/29/15 1741  BP: 170/96 140/99 180/110  Pulse: 96 75 80  Temp: 97.8 F (36.6 C) 98.1 F (36.7 C) 98.4 F (36.9 C)  TempSrc: Oral Oral Oral  Resp: 22 24 20   SpO2: 97% 99% 96%    GENERAL: The patient is a well-nourished male, in no acute distress. The vital signs are documented above. CARDIAC: There is a regular rate and rhythm.  VASCULAR: 2+ femoral pulses bilaterally. Nonpalpable  popliteal pulses. Nonpalpable right pedal pulses. Right foot appears pink and adequately perfused. 2+ left dorsalis pedis and posterior tibial pulses. PULMONARY: Nonlabored breathing. MUSCULOSKELETAL: There are no major deformities or cyanosis. NEUROLOGIC: No focal weakness or paresthesias are detected. SKIN: There are no ulcers or rashes noted. PSYCHIATRIC: The patient has a normal affect.   MEDICAL ISSUES:  Peripheral vascular disease with intermittent claudication  The patient's history and physical exam is consistent with chronic peripheral vascular disease. Discussed that his symptoms are not limb threatening. Given that his symptoms interfere with his daily activities, we will arrange for outpatient arteriogram with bilateral runoff and possible intervention. The patient can be discharged home.    Maris BergerKimberly Trinh, PA-C [facility] [place] [phone]      I have examined the patient, reviewed and agree with above. The patient reports chronic right calf claudication for several months with no acute exacerbation. Presented today to the emergency department for further evaluation. Discussed at length with the patient. This is not limb threatening. He reports that he is unable to tolerate this level claudication. He will require an outpatient arteriogram with possible intervention. Most likely would require right femoral-popliteal bypass for treatment of this. We will schedule this as an outpatient. He is safe for discharge from the ED  Gretta BeganEarly, Newton Frutiger, MD 06/29/2015 6:01 PM

## 2015-06-29 NOTE — Progress Notes (Signed)
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 50-99% stenosis of the distal right femoral artery. 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  ABI completed:     RIGHT    LEFT    PRESSURE WAVEFORM  PRESSURE WAVEFORM  BRACHIAL 164 Tri BRACHIAL 157 Tri  DP   DP    AT 77 Damp mono AT 122 mono  PT 74 Damp mono PT 129 mono  PER   PER    GREAT TOE  NA GREAT TOE  NA    RIGHT LEFT  ABI 0.47, severe 0.79, mild     Farrel DemarkJill Eunice, RDMS, RVT  06/29/2015, 2:54 PM

## 2015-06-29 NOTE — ED Notes (Signed)
Leg pain x 1 month in calf area. Left leg. Sts worse with walking.

## 2015-06-29 NOTE — Discharge Instructions (Signed)
Mr. Stephen Bowen,  Nice meeting you! Please follow-up with the Dr. Arbie CookeyEarly, vascular surgeon. Return to the emergency department if you develop increasing pain, have color changes in your legs. Feel better soon!  S. Lane HackerNicole Taniqua Issa, PA-C

## 2015-06-29 NOTE — ED Provider Notes (Signed)
CSN: [id number]     Arrival date & time 06/29/15  1250 History  By signing my name below, I, Elon SpannerGarrett Cook, attest that this documentation has been prepared under the direction and in the presence of Lane HackerNicole Deshan Hemmelgarn, PA-C. Electronically Signed: Elon SpannerGarrett Cook ED Scribe. 06/29/2015. 3:31 PM.    Chief Complaint  Patient presents with  . Leg Pain   The history is provided by the patient. No language interpreter was used.   HPI Comments: Stephen Bowen is a 60 y.o. male who presents to the Emergency Department complaining of 5/10 left calf pain onset 1 month ago; worse with walking.  He denies hx of DVT, recent surgery, recent prolonged immobilizations, travel, CP, SOB, color changes in leg.   Patient is a current smoker.    Past Medical History  Diagnosis Date  . HTN (hypertension)    Past Surgical History  Procedure Laterality Date  . 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   Family History  Problem Relation Age of Onset  . Cancer Mother     deceased in 1870s   . Heart disease Father   . Stroke Sister     age 3750s  . Heart disease Brother     pacemaker in his 2950s  . Heart attack Mother     diseased   Social History  Substance Use Topics  . Smoking status: Current Every Day Smoker -- 0.50 packs/day for 40 years    Types: Cigarettes  . Smokeless tobacco: Never Used  . Alcohol Use: 8.4 oz/week    14 Cans of beer per week    Review of Systems 10 Systems reviewed and all are negative for acute change except as noted in the HPI.   Allergies  Review of patient's allergies indicates no known allergies.  Home Medications   Prior to Admission medications   Medication Sig Start Date End Date Taking? Authorizing Provider  hydrochlorothiazide (HYDRODIURIL) 25 MG tablet Take 1 tablet (25 mg total) by mouth daily. 04/30/15   Trixie DredgeEmily West, PA-C  lisinopril-hydrochlorothiazide (ZESTORETIC) 10-12.5 MG per tablet Take 1 tablet by mouth daily. Patient  not taking: Reported on 04/30/2015 11/14/13   Marrian SalvageJacquelyn S Gill, MD  meloxicam (MOBIC) 7.5 MG tablet Take 1 tablet (7.5 mg total) by mouth daily as needed for pain. 04/30/15   Trixie DredgeEmily West, PA-C   BP 170/96 mmHg  Pulse 96  Temp(Src) 97.8 F (36.6 C) (Oral)  Resp 22  SpO2 97% Physical Exam  Constitutional: He is oriented to person, place, and time. He appears well-developed and well-nourished. No distress.  HENT:  Head: Normocephalic and atraumatic.  Mouth/Throat: Oropharynx is clear and moist. No oropharyngeal exudate.  Eyes: Conjunctivae are normal. Pupils are equal, round, and reactive to light. Right eye exhibits no discharge. Left eye exhibits no discharge. No scleral icterus.  Neck: Neck supple. No tracheal deviation present.  Cardiovascular: Normal rate, regular rhythm and normal heart sounds.  Exam reveals no gallop and no friction rub.   No murmur heard. 2+ femoral pulses bilaterally. Nonpalpable but dopplerable right pedal pulses. 2+ left dorsalis pedis and posterior tibial pulses.  Pulmonary/Chest: Effort normal and breath sounds normal. No respiratory distress. He has no wheezes. He has no rales. He exhibits no tenderness.  Abdominal: Soft. Bowel sounds are normal. He exhibits no distension and no mass. There is no tenderness. There is no rebound and no guarding.  Musculoskeletal: Normal range of motion. He exhibits  tenderness. He exhibits no edema.  Lymphadenopathy:    He has no cervical adenopathy.  Neurological: He is alert and oriented to person, place, and time. Coordination normal.  Skin: Skin is warm and dry. No rash noted. He is not diaphoretic. No erythema.  Psychiatric: He has a normal mood and affect. His behavior is normal.  Nursing note and vitals reviewed.   ED Course  Procedures   DIAGNOSTIC STUDIES: Oxygen Saturation is 97% on RA, normal by my interpretation.    COORDINATION OF CARE:  3:31 PM Discussed treatment plan with patient at bedside.  Patient  acknowledges and agrees with plan.    MDM   Final diagnoses:  Claudication (HCC)  Calf pain, right   Patient non-toxic appearing. Hypertensive on exam but he did not take his medication today and needs a refill. Vascular study was switched to arterial due to patient's claudication symptoms. Arterial study demonstrates 50-99% stenosis of the right profunda femoral artery and distal right femoral artery; occlusion of the right popliteal artery with collateral flow. Vascular surgery consulted and advised OP follow-up. Patient may be safely discharged home. Discussed reasons for return. Patient to follow-up with vascular. Patient in understanding and agreement with the plan.   Melton Krebs, PA-C 07/01/15 2054  Courteney Randall An, MD 07/02/15 [phone]

## 2015-06-29 NOTE — ED Notes (Signed)
Pt in Stephen Bowen

## 2015-06-30 ENCOUNTER — Other Ambulatory Visit: Payer: Self-pay

## 2015-07-07 ENCOUNTER — Encounter (HOSPITAL_COMMUNITY): Payer: Self-pay | Admitting: Vascular Surgery

## 2015-07-07 ENCOUNTER — Encounter (HOSPITAL_COMMUNITY)
Admission: RE | Disposition: A | Discharge status: Home / Self Care | Payer: Self-pay | Source: Ambulatory Visit | Attending: Vascular Surgery

## 2015-07-07 ENCOUNTER — Ambulatory Visit (HOSPITAL_COMMUNITY)
Admission: RE | Admit: 2015-07-07 | Discharge: 2015-07-07 | Disposition: A | Discharge status: Home / Self Care | Payer: Self-pay | Source: Ambulatory Visit | Attending: Vascular Surgery | Admitting: Vascular Surgery

## 2015-07-07 ENCOUNTER — Other Ambulatory Visit: Payer: Self-pay | Admitting: *Deleted

## 2015-07-07 DIAGNOSIS — I70219 Atherosclerosis of native arteries of extremities with intermittent claudication, unspecified extremity: Secondary | ICD-10-CM | POA: Diagnosis present

## 2015-07-07 DIAGNOSIS — I739 Peripheral vascular disease, unspecified: Secondary | ICD-10-CM

## 2015-07-07 DIAGNOSIS — I70211 Atherosclerosis of native arteries of extremities with intermittent claudication, right leg: Secondary | ICD-10-CM | POA: Insufficient documentation

## 2015-07-07 DIAGNOSIS — Z8249 Family history of ischemic heart disease and other diseases of the circulatory system: Secondary | ICD-10-CM | POA: Insufficient documentation

## 2015-07-07 DIAGNOSIS — I10 Essential (primary) hypertension: Secondary | ICD-10-CM | POA: Insufficient documentation

## 2015-07-07 DIAGNOSIS — Z01810 Encounter for preprocedural cardiovascular examination: Secondary | ICD-10-CM

## 2015-07-07 DIAGNOSIS — F17210 Nicotine dependence, cigarettes, uncomplicated: Secondary | ICD-10-CM | POA: Insufficient documentation

## 2015-07-07 HISTORY — PX: PERIPHERAL VASCULAR CATHETERIZATION: SHX172C

## 2015-07-07 LAB — POCT I-STAT, CHEM 8
BUN: 6 mg/dL (ref 6–20)
Calcium, Ion: 1.15 mmol/L (ref 1.12–1.23)
Chloride: 102 mmol/L (ref 101–111)
Creatinine, Ser: 1.2 mg/dL (ref 0.61–1.24)
Glucose, Bld: 97 mg/dL (ref 65–99)
HEMATOCRIT: 52 % (ref 39.0–52.0)
HEMOGLOBIN: 17.7 g/dL — AB (ref 13.0–17.0)
POTASSIUM: 3.8 mmol/L (ref 3.5–5.1)
SODIUM: 145 mmol/L (ref 135–145)
TCO2: 29 mmol/L (ref 0–100)

## 2015-07-07 SURGERY — ABDOMINAL AORTOGRAM W/LOWER EXTREMITY

## 2015-07-07 MED ORDER — SODIUM CHLORIDE 0.9 % IV SOLN: 1.0000 mL/kg/h | INTRAVENOUS | Status: DC

## 2015-07-07 MED ORDER — HYDRALAZINE HCL 20 MG/ML IJ SOLN
10.0000 mg | INTRAMUSCULAR | Status: DC | PRN
  Administered 2015-07-07: 10 mg via INTRAVENOUS

## 2015-07-07 MED ORDER — SODIUM CHLORIDE 0.9 % IV SOLN
INTRAVENOUS | Status: DC
  Administered 2015-07-07: 1000 mL via INTRAVENOUS

## 2015-07-07 MED ORDER — HEPARIN (PORCINE) IN NACL 2-0.9 UNIT/ML-% IJ SOLN
INTRAMUSCULAR | Status: AC
  Filled 2015-07-07: qty 500

## 2015-07-07 MED ORDER — LIDOCAINE HCL (PF) 1 % IJ SOLN
INTRAMUSCULAR | Status: AC
  Filled 2015-07-07: qty 30

## 2015-07-07 MED ORDER — HEPARIN (PORCINE) IN NACL 2-0.9 UNIT/ML-% IJ SOLN
INTRAMUSCULAR | Status: AC
  Filled 2015-07-07: qty 1000

## 2015-07-07 MED ORDER — MORPHINE SULFATE (PF) 2 MG/ML IV SOLN: 2.0000 mg | INTRAVENOUS | Status: DC | PRN

## 2015-07-07 MED ORDER — FENTANYL CITRATE (PF) 100 MCG/2ML IJ SOLN
INTRAMUSCULAR | Status: DC | PRN
  Administered 2015-07-07: 50 ug via INTRAVENOUS

## 2015-07-07 MED ORDER — HYDRALAZINE HCL 20 MG/ML IJ SOLN
INTRAMUSCULAR | Status: AC
  Filled 2015-07-07: qty 1

## 2015-07-07 MED ORDER — MIDAZOLAM HCL 2 MG/2ML IJ SOLN
INTRAMUSCULAR | Status: AC
  Filled 2015-07-07: qty 2

## 2015-07-07 MED ORDER — MIDAZOLAM HCL 2 MG/2ML IJ SOLN
INTRAMUSCULAR | Status: DC | PRN
  Administered 2015-07-07: 1 mg via INTRAVENOUS

## 2015-07-07 MED ORDER — OXYCODONE-ACETAMINOPHEN 5-325 MG PO TABS: 1.0000 | ORAL_TABLET | Freq: Four times a day (QID) | ORAL | Status: DC | PRN

## 2015-07-07 MED ORDER — ACETAMINOPHEN 325 MG PO TABS: 650.0000 mg | ORAL_TABLET | ORAL | Status: DC | PRN

## 2015-07-07 MED ORDER — LIDOCAINE HCL (PF) 1 % IJ SOLN
INTRAMUSCULAR | Status: DC | PRN
  Administered 2015-07-07: 08:00:00

## 2015-07-07 MED ORDER — FENTANYL CITRATE (PF) 100 MCG/2ML IJ SOLN
INTRAMUSCULAR | Status: AC
  Filled 2015-07-07: qty 2

## 2015-07-07 MED ORDER — IODIXANOL 320 MG/ML IV SOLN
INTRAVENOUS | Status: DC | PRN
  Administered 2015-07-07: 90 mL via INTRA_ARTERIAL

## 2015-07-07 SURGICAL SUPPLY — 9 items
CATH OMNI FLUSH 5F 65CM (CATHETERS) ×2 IMPLANT
COVER PRB 48X5XTLSCP FOLD TPE (BAG) ×1 IMPLANT
COVER PROBE 5X48 (BAG) ×1
KIT PV (KITS) ×2 IMPLANT
SHEATH PINNACLE 5F 10CM (SHEATH) ×2 IMPLANT
SYR MEDRAD MARK V 150ML (SYRINGE) ×2 IMPLANT
TRANSDUCER W/STOPCOCK (MISCELLANEOUS) ×2 IMPLANT
TRAY PV CATH (CUSTOM PROCEDURE TRAY) ×2 IMPLANT
WIRE BENTSON .035X145CM (WIRE) ×2 IMPLANT

## 2015-07-07 NOTE — H&P (View-Only) (Signed)
[facility] [place]  Patient name: Stephen ApleyJerome D Miklas MRN: [id number] DOB: Nov 19, 1955 Sex: male  REASON FOR CONSULT: Right leg pain, consult is from ED provider.  HPI: Stephen Bowen is a 60 y.o. male, who presents with a  2 month history of right calf pain. This has progressively worsened. The patient experiences right calf pain after ambulating about a mile. He denies any pain with his left leg. He says that this pain interferes with his daily activity. He denies any rest pain. He denies any history of nonhealing wounds. He is a longtime smoker.  He has a past medical history of hypertension. He denies any history of cardiac disease.  Past Medical History  Diagnosis Date  . HTN (hypertension)     Family History  Problem Relation Age of Onset  . Cancer Mother     deceased in 4070s   . Heart disease Father   . Stroke Sister     age 7450s  . Heart disease Brother     pacemaker in his 1950s  . Heart attack Mother     diseased    SOCIAL HISTORY: Social History   Social History  . Marital Status: Single    Spouse Name: N/A  . Number of Children: N/A  . Years of Education: N/A   Occupational History  . Not on file.   Social History Main Topics  . Smoking status: Current Every Day Smoker -- 0.50 packs/day for 40 years    Types: Cigarettes  . Smokeless tobacco: Never Used  . Alcohol Use: 8.4 oz/week    14 Cans of beer per week  . Drug Use: No  . Sexual Activity: Yes    Birth Control/ Protection: Condom   Other Topics Concern  . Not on file   Social History Narrative   Smoking cigarettes since 60 y.o   Drinks beer 24 oz every other day                        No Known Allergies  No current facility-administered medications for this encounter.   Current Outpatient Prescriptions  Medication Sig Dispense Refill  . hydrochlorothiazide (HYDRODIURIL) 25 MG tablet Take 1 tablet (25 mg total) by mouth daily. 30 tablet 0  .  HYDROcodone-acetaminophen (NORCO/VICODIN) 5-325 MG tablet Take 1-2 tablets by mouth every 6 (six) hours as needed. 20 tablet 0  . lisinopril-hydrochlorothiazide (ZESTORETIC) 10-12.5 MG per tablet Take 1 tablet by mouth daily. (Patient not taking: Reported on 04/30/2015) 30 tablet 1  . meloxicam (MOBIC) 7.5 MG tablet Take 1 tablet (7.5 mg total) by mouth daily as needed for pain. 14 tablet 0    REVIEW OF SYSTEMS:  [X]  denotes positive finding, [ ]  denotes negative finding Cardiac  Comments:  Chest pain or chest pressure:    Shortness of breath upon exertion:    Short of breath when lying flat:    Irregular heart rhythm:        Vascular    Pain in calf, thigh, or hip brought on by ambulation: x   Pain in feet at night that wakes you up from your sleep:     Blood clot in your veins:    Leg swelling:         Pulmonary    Oxygen at home:    Productive cough:     Wheezing:         Neurologic    Sudden weakness in arms  or legs:     Sudden numbness in arms or legs:     Sudden onset of difficulty speaking or slurred speech:    Temporary loss of vision in one eye:     Problems with dizziness:         Gastrointestinal    Blood in stool:     Vomited blood:         Genitourinary    Burning when urinating:     Blood in urine:        Psychiatric    Major depression:         Hematologic    Bleeding problems:    Problems with blood clotting too easily:        Skin    Rashes or ulcers:        Constitutional    Fever or chills:      PHYSICAL EXAM: Filed Vitals:   06/29/15 1310 06/29/15 1619 06/29/15 1741  BP: 170/96 140/99 180/110  Pulse: 96 75 80  Temp: 97.8 F (36.6 C) 98.1 F (36.7 C) 98.4 F (36.9 C)  TempSrc: Oral Oral Oral  Resp: 22 24 20   SpO2: 97% 99% 96%    GENERAL: The patient is a well-nourished male, in no acute distress. The vital signs are documented above. CARDIAC: There is a regular rate and rhythm.  VASCULAR: 2+ femoral pulses bilaterally. Nonpalpable  popliteal pulses. Nonpalpable right pedal pulses. Right foot appears pink and adequately perfused. 2+ left dorsalis pedis and posterior tibial pulses. PULMONARY: Nonlabored breathing. MUSCULOSKELETAL: There are no major deformities or cyanosis. NEUROLOGIC: No focal weakness or paresthesias are detected. SKIN: There are no ulcers or rashes noted. PSYCHIATRIC: The patient has a normal affect.   MEDICAL ISSUES:  Peripheral vascular disease with intermittent claudication  The patient's history and physical exam is consistent with chronic peripheral vascular disease. Discussed that his symptoms are not limb threatening. Given that his symptoms interfere with his daily activities, we will arrange for outpatient arteriogram with bilateral runoff and possible intervention. The patient can be discharged home.    Maris BergerKimberly Trinh, PA-C [facility] [place] [phone]      I have examined the patient, reviewed and agree with above. The patient reports chronic right calf claudication for several months with no acute exacerbation. Presented today to the emergency department for further evaluation. Discussed at length with the patient. This is not limb threatening. He reports that he is unable to tolerate this level claudication. He will require an outpatient arteriogram with possible intervention. Most likely would require right femoral-popliteal bypass for treatment of this. We will schedule this as an outpatient. He is safe for discharge from the ED  Gretta BeganEarly, Annabeth Tortora, MD 06/29/2015 6:01 PM

## 2015-07-07 NOTE — Progress Notes (Signed)
Site area: left groin a 5 french arterialsheath was removed  Site Prior to Removal:  Level 0  Pressure Applied For 15 MINUTES    Minutes Beginning at 0855a  Manual:   Yes.    Patient Status During Pull:  stable  Post Pull Groin Site:  Level 0  Post Pull Instructions Given:  Yes.    Post Pull Pulses Present:  Yes.    Dressing Applied:  Yes.    Comments:  BP was elevated 190/103 and Hydralazine 10 mg given per MD order.  BP at present is 158/76.

## 2015-07-07 NOTE — Discharge Instructions (Signed)
Angiogram, Care After °Refer to this sheet in the next few weeks. These instructions provide you with information about caring for yourself after your procedure. Your health care provider may also give you more specific instructions. Your treatment has been planned according to current medical practices, but problems sometimes occur. Call your health care provider if you have any problems or questions after your procedure. °WHAT TO EXPECT AFTER THE PROCEDURE °After your procedure, it is typical to have the following: °· Bruising at the catheter insertion site that usually fades within 1-2 weeks. °· Blood collecting in the tissue (hematoma) that may be painful to the touch. It should usually decrease in size and tenderness within 1-2 weeks. °HOME CARE INSTRUCTIONS °· Take medicines only as directed by your health care provider. °· You may shower 24-48 hours after the procedure or as directed by your health care provider. Remove the bandage (dressing) and gently wash the site with plain soap and water. Pat the area dry with a clean towel. Do not rub the site, because this may cause bleeding. °· Do not take baths, swim, or use a hot tub until your health care provider approves. °· Check your insertion site every day for redness, swelling, or drainage. °· Do not apply powder or lotion to the site. °· Do not lift over 10 lb (4.5 kg) for 5 days after your procedure or as directed by your health care provider. °· Ask your health care provider when it is okay to: °¨ Return to work or school. °¨ Resume usual physical activities or sports. °¨ Resume sexual activity. °· Do not drive home if you are discharged the same day as the procedure. Have someone else drive you. °· You may drive 24 hours after the procedure unless otherwise instructed by your health care provider. °· Do not operate machinery or power tools for 24 hours after the procedure or as directed by your health care provider. °· If your procedure was done as an  outpatient procedure, which means that you went home the same day as your procedure, a responsible adult should be with you for the first 24 hours after you arrive home. °· Keep all follow-up visits as directed by your health care provider. This is important. °SEEK MEDICAL CARE IF: °· You have a fever. °· You have chills. °· You have increased bleeding from the catheter insertion site. Hold pressure on the site and call 911. °SEEK IMMEDIATE MEDICAL CARE IF: °· You have unusual pain at the catheter insertion site. °· You have redness, warmth, or swelling at the catheter insertion site. °· You have drainage (other than a small amount of blood on the dressing) from the catheter insertion site. °· The catheter insertion site is bleeding, and the bleeding does not stop after 30 minutes of holding steady pressure on the site. °· The area near or just beyond the catheter insertion site becomes pale, cool, tingly, or numb. °  °This information is not intended to replace advice given to you by your health care provider. Make sure you discuss any questions you have with your health care provider. °  °Document Released: 12/28/2004 Document Revised: 07/02/2014 Document Reviewed: 11/12/2012 °Elsevier Interactive Patient Education ©2016 [employer] ° °

## 2015-07-07 NOTE — Interval H&P Note (Signed)
[facility]  History and Physical Update  The patient was interviewed and re-examined.  The patient's previous History and Physical has been reviewed and is unchanged from Dr. Arbie CookeyEarly consult.  There is no change in the plan of care: aortogram, bilateral leg runoff, and possible right leg intervention.  I discussed with the patient the nature of angiographic procedures, especially the limited patencies of any endovascular intervention.  The patient is aware of that the risks of an angiographic procedure include but are not limited to: bleeding, infection, access site complications, renal failure, embolization, rupture of vessel, dissection, possible need for emergent surgical intervention, possible need for surgical procedures to treat the patient's pathology, anaphylactic reaction to contrast, and stroke and death.  The patient is aware of the risks and agrees to proceed.   Leonides SakeBrian Dauntae Derusha, MD [facility] [place] Office: [phone] Pager: [phone]  07/07/2015, 7:12 AM

## 2015-07-08 ENCOUNTER — Telehealth: Payer: Self-pay | Admitting: Vascular Surgery

## 2015-07-08 MED FILL — Heparin Sodium (Porcine) 2 Unit/ML in Sodium Chloride 0.9%: INTRAMUSCULAR | Qty: 500 | Status: AC

## 2015-07-08 MED FILL — Lidocaine HCl Local Preservative Free (PF) Inj 1%: INTRAMUSCULAR | Qty: 30 | Status: AC

## 2015-07-08 NOTE — Telephone Encounter (Signed)
Spoke with pt to schedule, dpm °

## 2015-07-08 NOTE — Telephone Encounter (Signed)
-----   Message from Sharee PimpleMarilyn K McChesney, RN sent at 07/07/2015  9:24 AM EST ----- Regarding: schedule   ----- Message -----    From: Fransisco HertzBrian L Chen, MD    Sent: 07/07/2015   9:00 AM      [address]  Freddie ApleyJerome D Storer [id number] 10/19/1955  PROCEDURE: 1.  Left common femoral artery cannulation under ultrasound guidance 2.  Placement of catheter in aorta 3.  Aortogram 4.  Second order arterial selection 5.  Right leg runoff via catheter 6.  Left leg runoff via sheath 7.  Conscious sedation  Follow-up: 2-4 weeks with DR. EARLY  Studies: BLE GSV mapping

## 2015-08-09 ENCOUNTER — Encounter: Payer: Self-pay | Admitting: Vascular Surgery

## 2015-08-09 ENCOUNTER — Encounter (HOSPITAL_COMMUNITY): Payer: Self-pay

## 2015-08-16 ENCOUNTER — Encounter: Payer: Self-pay | Admitting: Vascular Surgery

## 2015-08-16 ENCOUNTER — Ambulatory Visit (HOSPITAL_COMMUNITY)
Admission: RE | Admit: 2015-08-16 | Discharge: 2015-08-16 | Disposition: A | Discharge status: Home / Self Care | Payer: Self-pay | Source: Ambulatory Visit | Attending: Vascular Surgery | Admitting: Vascular Surgery

## 2015-08-16 ENCOUNTER — Ambulatory Visit (INDEPENDENT_AMBULATORY_CARE_PROVIDER_SITE_OTHER): Payer: Self-pay | Admitting: Vascular Surgery

## 2015-08-16 VITALS — BP 219/128 | HR 88 | Ht 70.0 in | Wt 129.0 lb

## 2015-08-16 DIAGNOSIS — I10 Essential (primary) hypertension: Secondary | ICD-10-CM | POA: Insufficient documentation

## 2015-08-16 DIAGNOSIS — Z01810 Encounter for preprocedural cardiovascular examination: Secondary | ICD-10-CM | POA: Insufficient documentation

## 2015-08-16 DIAGNOSIS — I70213 Atherosclerosis of native arteries of extremities with intermittent claudication, bilateral legs: Secondary | ICD-10-CM

## 2015-08-16 DIAGNOSIS — I739 Peripheral vascular disease, unspecified: Secondary | ICD-10-CM | POA: Insufficient documentation

## 2015-08-16 NOTE — Progress Notes (Signed)
HISTORY AND PHYSICAL     CC:  Leg pain with walking Referring Provider:  Adyn Hoes F, MD  HPI: This is a 60 y.o. male who was seen by Dr. Arbie Cookey in the ER for lifestyle limiting claudication last month.  The pt underwent aortogram by Dr. Imogene Burn, which revealed severe arterial disease.   He continues to have claudication when he walks.  He can walk ~ 1 block before he has to stop and rest and his pain resolves.  He does not have any non healing wounds.   He states that he does not walk much and stays at home.  He continues to smoke.   His BP is elevated today.  He states that he has been out of his medicine.  He takes one pill a day, but does not know what it is.    Past Medical History  Diagnosis Date  . HTN (hypertension)     Past Surgical History  Procedure Laterality Date  . 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  . Peripheral vascular catheterization N/A 07/07/2015    Procedure: Abdominal Aortogram w/Lower Extremity;  Surgeon: Fransisco Hertz, MD;  Location: [facility] INVASIVE CV LAB;  Service: Cardiovascular;  Laterality: N/A;    No Known Allergies  Current Outpatient Prescriptions  Medication Sig Dispense Refill  . hydrochlorothiazide (HYDRODIURIL) 25 MG tablet Take 1 tablet (25 mg total) by mouth daily. 30 tablet 0  . HYDROcodone-acetaminophen (NORCO/VICODIN) 5-325 MG tablet Take 1-2 tablets by mouth every 6 (six) hours as needed. (Patient not taking: Reported on 08/16/2015) 20 tablet 0  . lisinopril-hydrochlorothiazide (ZESTORETIC) 10-12.5 MG tablet Take 1 tablet by mouth daily. 30 tablet 1  . meloxicam (MOBIC) 7.5 MG tablet Take 1 tablet (7.5 mg total) by mouth daily as needed for pain. 14 tablet 0   No current facility-administered medications for this visit.    Family History  Problem Relation Age of Onset  . Cancer Mother     deceased in 5s   . Heart disease Father   . Stroke Sister     age 57s  . Heart disease Brother    pacemaker in his 26s  . Heart attack Mother     diseased    Social History   Social History  . Marital Status: Single    Spouse Name: N/A  . Number of Children: N/A  . Years of Education: N/A   Occupational History  . Not on file.   Social History Main Topics  . Smoking status: Current Every Day Smoker -- 0.50 packs/day for 40 years    Types: Cigarettes  . Smokeless tobacco: Never Used  . Alcohol Use: 8.4 oz/week    14 Cans of beer per week  . Drug Use: No  . Sexual Activity: Yes    Birth Control/ Protection: Condom   Other Topics Concern  . Not on file   Social History Narrative   Smoking cigarettes since 60 y.o   Drinks beer 24 oz every other day                         REVIEW OF SYSTEMS:    denotes positive finding,  denotes negative finding Cardiac  Comments:  Chest pain or chest pressure:    Shortness of breath upon exertion:   Larina Earthlybreath when lying flat:    Irregular heart rhythm:  Vascular    Pain in calf, thigh, or hip brought on by ambulation: x See HPI  Pain in feet at night that wakes you up from your sleep:     Blood clot in your veins:  Pt states he has hx of blood clot in vein but upon talking with him further, he is describing the blockages in his legs causing his claudication.   Leg swelling:         Pulmonary    Oxygen at home:    Productive cough:     Wheezing:         Neurologic    Sudden weakness in arms or legs:     Sudden numbness in arms or legs:     Sudden onset of difficulty speaking or slurred speech:    Temporary loss of vision in one eye:     Problems with dizziness:         Gastrointestinal    Blood in stool:     Vomited blood:         Genitourinary    Burning when urinating:     Blood in urine:        Psychiatric    Major depression:         Hematologic    Bleeding problems:    Problems with blood clotting too easily:        Skin    Rashes or ulcers:        Constitutional    Fever or  chills:      PHYSICAL EXAMINATION:  Filed Vitals:   08/16/15 1606 08/16/15 1609  BP: 212/133 219/128  Pulse: 88    Body mass index is 18.51 kg/(m^2).  General:  WDWN in NAD; vital signs documented above Gait: Not observed HENT: WNL, normocephalic Pulmonary: normal non-labored breathing , without Rales, rhonchi,  wheezing Cardiac: regular HR, without  Murmurs, rubs or gallops; without carotid bruits Abdomen: soft, NT, no masses Skin: without rashes Vascular Exam/Pulses:  Right Left  Radial 2+ (normal) 2+ (normal)  Femoral 2+ (normal) 2+ (normal)  Popliteal Unable to palpate  Unable to palpate   DP Unable to palpate Unable to palpate   PT Unable to palpate  Unable to palpate    Extremities: without ischemic changes, without Gangrene , without cellulitis; without open wounds;  Musculoskeletal: no muscle wasting or atrophy  Neurologic: A&O X 3;  No focal weakness or paresthesias are detected Psychiatric:  The pt has Flat affect.   Non-Invasive Vascular Imaging:   Lower extremity vein mapping 08/16/15: Right GSV:  0.13cm-0.39cm Right SSV:  0.18cm-0.20cm Left GSV:  0.20cm-0.46cm Left SSV:  0.17cm-0.20cm    ASSESSMENT/PLAN:: 60 y.o. male with lifestyle limiting claudication   -the pt does not have any non healing wounds at this time and his claudication is not limb threatening at this time.   -Dr. Arbie Cookey does not recommend a bypass at this time due to the fact he has one vessel runoff and his GSV is not adequate for bypass to the tibial vessel. -Pletal might be beneficial for him, however, he does not have insurance and cannot afford his medicine at this time.  -he has been plugged in with [facility] and we will get him an appointment with the [facility] as he has significantly elevated blood pressure today and it does not appear he is taking his medication b/c he cannot afford it.  He understands if he becomes symptomatic, he needs to go to  the  ER for evaluation. -Hopefully, when established with PCP, he can get assistance with his medication and then revisit the Pletal option. -it was also discussed with the pt the importance of smoking cessation involiing his claudication sx.    -we will see him back in 3 months   Doreatha Massed, PA-C [facility] [phone]  Clinic MD:  Pt seen and examined in conjunction with Dr. Arbie Cookey  I have examined the patient, reviewed and agree with above.  Gretta Began, MD 08/16/2015 4:53 PM

## 2015-08-17 ENCOUNTER — Other Ambulatory Visit: Payer: Self-pay | Admitting: *Deleted

## 2015-08-17 DIAGNOSIS — R030 Elevated blood-pressure reading, without diagnosis of hypertension: Secondary | ICD-10-CM

## 2015-08-17 DIAGNOSIS — I10 Essential (primary) hypertension: Secondary | ICD-10-CM

## 2015-08-18 ENCOUNTER — Ambulatory Visit: Payer: Self-pay | Admitting: Internal Medicine

## 2015-08-29 ENCOUNTER — Ambulatory Visit: Payer: Self-pay | Admitting: Internal Medicine

## 2015-08-31 ENCOUNTER — Ambulatory Visit (INDEPENDENT_AMBULATORY_CARE_PROVIDER_SITE_OTHER): Payer: Self-pay | Admitting: Internal Medicine

## 2015-08-31 ENCOUNTER — Encounter: Payer: Self-pay | Admitting: Internal Medicine

## 2015-08-31 VITALS — BP 218/133 | HR 97 | Temp 98.1°F | Resp 18 | Ht 68.0 in | Wt 135.8 lb

## 2015-08-31 DIAGNOSIS — F17200 Nicotine dependence, unspecified, uncomplicated: Secondary | ICD-10-CM

## 2015-08-31 DIAGNOSIS — L570 Actinic keratosis: Secondary | ICD-10-CM

## 2015-08-31 DIAGNOSIS — L98499 Non-pressure chronic ulcer of skin of other sites with unspecified severity: Secondary | ICD-10-CM | POA: Insufficient documentation

## 2015-08-31 DIAGNOSIS — L219 Seborrheic dermatitis, unspecified: Secondary | ICD-10-CM | POA: Insufficient documentation

## 2015-08-31 DIAGNOSIS — Z79899 Other long term (current) drug therapy: Secondary | ICD-10-CM

## 2015-08-31 DIAGNOSIS — F17210 Nicotine dependence, cigarettes, uncomplicated: Secondary | ICD-10-CM

## 2015-08-31 DIAGNOSIS — I160 Hypertensive urgency: Secondary | ICD-10-CM

## 2015-08-31 MED ORDER — LISINOPRIL-HYDROCHLOROTHIAZIDE 10-12.5 MG PO TABS: 1.0000 | ORAL_TABLET | Freq: Every day | ORAL | Status: DC

## 2015-08-31 MED ORDER — VERAPAMIL HCL ER 120 MG PO TBCR: 120.0000 mg | EXTENDED_RELEASE_TABLET | Freq: Every day | ORAL | Status: DC

## 2015-08-31 MED ORDER — SELENIUM SULFIDE 1 % EX LOTN: TOPICAL_LOTION | CUTANEOUS | Status: DC

## 2015-08-31 MED ORDER — NICOTINE 21 MG/24HR TD PT24: 21.0000 mg | MEDICATED_PATCH | TRANSDERMAL | Status: DC

## 2015-08-31 MED ORDER — NICOTINE POLACRILEX 4 MG MT GUM: 4.0000 mg | CHEWING_GUM | OROMUCOSAL | Status: DC | PRN

## 2015-08-31 NOTE — Progress Notes (Signed)
Patient ID: Stephen Bowen, male   DOB: 18-Aug-1955, 89 y.o.   MRN: [id number] [facility] Subjective:   Patient ID: Stephen ApleyJerome D Lawrie male   DOB: 18-Aug-1955 60 y.o.   MRN: [id number]  HPI: Mr.Jersey D Jenne CampusMcQueen is a 60 y.o. male with peripheral vascular disease, hypertension, and tobacco use disorder presenting to establish with our clinic and evaluation for hypertension and a rash on his face.  Hypertension: He has not been taking his lisionpril-HCTZ for the last week. His blood pressure today was 230/130 but he denies any headaches, vision changes, or chest pain. His pressure has been elevated in the 200s for the last few months per chart review.  Seborrheic dermatitis: He complains of a mildly itchy, red, scaly rash on his eyebrows, aroud his nose, and the back of his ears.  Past medical history: Peripheral vascular disease Hypertension  Family history: Brother died from MI at 6359 Mother died of unknown cancer Father died of unknown cause No history of cancer  Social history:  He lives with his Kateri McUncle [place] in [place] He's a retired Administratorlandscaper He smokes 0.5packs of cigarettes daily for the last 30 years He drinks a tall boy of beer daily No other illicit drugs  Review of Systems  Constitutional: Negative for fever, chills and weight loss.  Eyes: Negative for blurred vision, double vision and photophobia.  Respiratory: Negative for shortness of breath and wheezing.   Cardiovascular: Negative for chest pain, palpitations and orthopnea.  Gastrointestinal: Negative for abdominal pain.  Musculoskeletal: Negative for myalgias.  Skin: Positive for itching and rash.  Neurological: Negative for dizziness, sensory change, focal weakness and headaches.    Objective:  Physical Exam: Filed Vitals:   08/31/15 1433 08/31/15 1438  BP: 229/125 218/133  Pulse: 92 97  Temp: 98.1 F (36.7 C)   TempSrc: Oral   Resp: 18   Height: 5\' 8"  (1.727 m)   Weight: 135 lb  12.8 oz (61.598 kg)   SpO2: 100%    General: resting in chair comfortably, appropriately conversational HEENT: no scleral icterus, extra-ocular muscles intact, oropharynx without lesions Cardiac: regular rate and rhythm, no rubs, murmurs or gallops Pulm: breathing well, clear to auscultation bilaterally Abd: bowel sounds normal, soft, nondistended, non-tender Ext: warm and well perfused, without pedal edema Lymph: no cervical or supraclavicular lymphadenopathy Skin: 1) greasy, scaly yellow plaques on bilateral eyebrows, nasolabial folds, and posterior ears. 2) rough scaly erythematous plaques on face and scalp 3) there is a shallow ulcer on the vertex of his calp  Neuro: alert and oriented X3, cranial nerves II-XII grossly intact, moving all extremities well   Assessment & Plan:  Case discussed with Dr. Rogelia BogaButcher  Hypertensive urgency His blood pressure is markedly elevated at 230/130 today on my manual check, but he does not have signs nor symptoms of end-organ damage. I think this is a longstanding problem for him. I've recommended he continue back on the lisinopril-hydrochlorothiazide 10-12.5mg  tablet daily and I've also started verapamil 120mg  daily. We'll see him back in 1 week to check his pressure again as well as a BMP. I'm sure he'll continue to be hypertensive so we can double his lisinopril-HCTZ at the next visit and continue going up on the verapamil as well.  Tobacco use disorder He is smoking 0.5ppd and is interested in trying the patch and gum which I have prescribed today. I spent greater than 10 minutes counseling him on the importance of smoking cessation today.  Seborrheic dermatitis For  his seborrheic dermatitis I've prescribed selenium sulfide wash for him to use daily. Should he continue to have symptoms, once he gets insurance, we can prescribe ketoconazole cream twice daily.  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 We'll need to biopsy this at the next visit as I ran out of time today.   Medications Ordered Meds ordered this encounter  Medications  . selenium sulfide (SELSUN) 1 % LOTN    Sig: Put on your face in the shower and wash off daily    Dispense:  1 Bottle    Refill:  3  . verapamil (CALAN-SR) 120 MG CR tablet    Sig: Take 1 tablet (120 mg total) by mouth daily.    Dispense:  30 tablet    Refill:  11  . nicotine (NICODERM CQ - DOSED IN MG/24 HOURS) 21 mg/24hr patch    Sig: Place 1 patch (21 mg total) onto the skin daily.    Dispense:  30 patch    Refill:  3  . nicotine polacrilex (NICORETTE) 4 MG gum    Sig: Take 1 each (4 mg total) by mouth as needed for smoking cessation.    Dispense:  100 tablet    Refill:  0   Follow Up: Return in about 1 week (around 09/07/2015).

## 2015-08-31 NOTE — Assessment & Plan Note (Signed)
He is smoking 0.5ppd and is interested in trying the patch and gum which I have prescribed today. I spent greater than 10 minutes counseling him on the importance of smoking cessation today.

## 2015-08-31 NOTE — Addendum Note (Signed)
Addended by: Mliss FritzKIM, JENNIFER J on: 08/31/2015 08:24 PM   Modules accepted: Orders, Medications

## 2015-08-31 NOTE — Assessment & Plan Note (Signed)
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 If he can get insurance, I think topical 5-flurouracil would be a great option to clear the AKs as his disease is too diffuse for local treatments

## 2015-08-31 NOTE — Assessment & Plan Note (Signed)
His blood pressure is markedly elevated at 230/130 today on my manual check, but he does not have signs nor symptoms of end-organ damage. I think this is a longstanding problem for him. I've recommended he continue back on the lisinopril-hydrochlorothiazide 10-12.5mg  tablet daily and I've also started verapamil 120mg  daily. We'll see him back in 1 week to check his pressure again as well as a BMP. I'm sure he'll continue to be hypertensive so we can double his lisinopril-HCTZ at the next visit and continue going up on the verapamil as well.

## 2015-08-31 NOTE — Patient Instructions (Signed)
Mr. Stephen Bowen,  It was great to meet you today.   We have a lot of work to do your high blood pressure. It is DANGEROUSLY HIGH at 230/130 today (normal is less than 120/80).  I've started another medication called verapamil today.  Come back in 1 week so we can check it again.  Quitting smoking is also crucially important for you. Call the Quitline so they can get you the patch and gum to help you quit.  I've also prescribed you selenium sulfide wash for your fungal infection on your face.  Take care, Dr. Earnest ConroyFlores

## 2015-08-31 NOTE — Assessment & Plan Note (Signed)
For his seborrheic dermatitis I've prescribed selenium sulfide wash for him to use daily. Should he continue to have symptoms, once he gets insurance, we can prescribe ketoconazole cream twice daily.

## 2015-08-31 NOTE — Assessment & Plan Note (Signed)
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 We'll need to biopsy this at the next visit as I ran out of time today.

## 2015-08-31 NOTE — Addendum Note (Signed)
Addended by: Mliss FritzKIM, JENNIFER J on: 08/31/2015 08:23 PM   Modules accepted: Orders

## 2015-08-31 NOTE — Progress Notes (Signed)
Collaboration with physician to fill meds through [facility] outpatient pharmacy for patient.

## 2015-09-01 MED FILL — VERAPAMIL ER 120 MG TABLET: 120 | 30 days supply | Qty: 30 | Fill #0

## 2015-09-01 MED FILL — LISINOPRIL-HCTZ 10-12.5 MG: 10-12.5 | 30 days supply | Qty: 30 | Fill #0

## 2015-09-02 ENCOUNTER — Encounter: Payer: Self-pay | Admitting: Pharmacist

## 2015-09-02 ENCOUNTER — Ambulatory Visit (INDEPENDENT_AMBULATORY_CARE_PROVIDER_SITE_OTHER): Payer: Self-pay | Admitting: Pharmacist

## 2015-09-02 DIAGNOSIS — Z79899 Other long term (current) drug therapy: Secondary | ICD-10-CM

## 2015-09-02 DIAGNOSIS — Z7189 Other specified counseling: Secondary | ICD-10-CM

## 2015-09-02 NOTE — Progress Notes (Signed)
Medication(s) were reviewed with the patient's sister-in-law, Rita Wright-Peretz, includinNoralee Charsg name, instructions, indication, goals of therapy, potential side effects, importance of adherence, and safe use. Assistance with [facility] from [facility].  Patient's family verbalized understanding by repeating back information and was advised to contact me if further medication-related questions arise. Patient was also provided an information handout.

## 2015-09-02 NOTE — Addendum Note (Signed)
Addended by: Cicero DuckFLORES, Shaterica Mcclatchy H on: 09/02/2015 12:08 PM   Modules accepted: Level of Service, SmartSet

## 2015-09-02 NOTE — Patient Instructions (Signed)
Patient educated about medication as defined in this encounter and verbalized understanding by repeating back instructions provided.   

## 2015-09-02 NOTE — Progress Notes (Signed)
Internal Medicine Clinic Attending  Case discussed with Dr. Flores at the time of the visit.  We reviewed the resident's history and exam and pertinent patient test results.  I agree with the assessment, diagnosis, and plan of care documented in the resident's note. 

## 2016-01-19 ENCOUNTER — Ambulatory Visit: Payer: Self-pay

## 2016-02-13 ENCOUNTER — Emergency Department (HOSPITAL_COMMUNITY): Payer: Self-pay

## 2016-02-13 ENCOUNTER — Inpatient Hospital Stay (HOSPITAL_COMMUNITY)
Admission: EM | Admit: 2016-02-13 | Discharge: 2016-02-19 | DRG: 253 | Disposition: A | Discharge status: Home / Self Care | Payer: Self-pay | Attending: Internal Medicine | Admitting: Internal Medicine

## 2016-02-13 ENCOUNTER — Encounter (HOSPITAL_COMMUNITY): Payer: Self-pay | Admitting: Emergency Medicine

## 2016-02-13 DIAGNOSIS — R52 Pain, unspecified: Secondary | ICD-10-CM

## 2016-02-13 DIAGNOSIS — Z01810 Encounter for preprocedural cardiovascular examination: Secondary | ICD-10-CM

## 2016-02-13 DIAGNOSIS — Z79899 Other long term (current) drug therapy: Secondary | ICD-10-CM

## 2016-02-13 DIAGNOSIS — I998 Other disorder of circulatory system: Secondary | ICD-10-CM

## 2016-02-13 DIAGNOSIS — Z9889 Other specified postprocedural states: Secondary | ICD-10-CM

## 2016-02-13 DIAGNOSIS — E875 Hyperkalemia: Secondary | ICD-10-CM | POA: Diagnosis present

## 2016-02-13 DIAGNOSIS — I10 Essential (primary) hypertension: Secondary | ICD-10-CM

## 2016-02-13 DIAGNOSIS — F17210 Nicotine dependence, cigarettes, uncomplicated: Secondary | ICD-10-CM | POA: Diagnosis present

## 2016-02-13 DIAGNOSIS — F17200 Nicotine dependence, unspecified, uncomplicated: Secondary | ICD-10-CM | POA: Diagnosis present

## 2016-02-13 DIAGNOSIS — M79605 Pain in left leg: Secondary | ICD-10-CM

## 2016-02-13 DIAGNOSIS — E876 Hypokalemia: Secondary | ICD-10-CM | POA: Diagnosis not present

## 2016-02-13 DIAGNOSIS — D62 Acute posthemorrhagic anemia: Secondary | ICD-10-CM | POA: Diagnosis not present

## 2016-02-13 DIAGNOSIS — M79604 Pain in right leg: Secondary | ICD-10-CM

## 2016-02-13 DIAGNOSIS — I739 Peripheral vascular disease, unspecified: Secondary | ICD-10-CM

## 2016-02-13 DIAGNOSIS — I70219 Atherosclerosis of native arteries of extremities with intermittent claudication, unspecified extremity: Secondary | ICD-10-CM | POA: Diagnosis present

## 2016-02-13 DIAGNOSIS — E872 Acidosis, unspecified: Secondary | ICD-10-CM | POA: Diagnosis present

## 2016-02-13 DIAGNOSIS — Z9114 Patient's other noncompliance with medication regimen: Secondary | ICD-10-CM

## 2016-02-13 DIAGNOSIS — I160 Hypertensive urgency: Secondary | ICD-10-CM | POA: Diagnosis present

## 2016-02-13 DIAGNOSIS — M79606 Pain in leg, unspecified: Secondary | ICD-10-CM

## 2016-02-13 DIAGNOSIS — I70213 Atherosclerosis of native arteries of extremities with intermittent claudication, bilateral legs: Principal | ICD-10-CM | POA: Diagnosis present

## 2016-02-13 HISTORY — DX: Peripheral vascular disease, unspecified: I73.9

## 2016-02-13 LAB — RAPID URINE DRUG SCREEN, HOSP PERFORMED
AMPHETAMINES: NOT DETECTED
BENZODIAZEPINES: NOT DETECTED
Barbiturates: NOT DETECTED
Cocaine: NOT DETECTED
OPIATES: NOT DETECTED
Tetrahydrocannabinol: NOT DETECTED

## 2016-02-13 LAB — I-STAT CHEM 8, ED
BUN: 15 mg/dL (ref 6–20)
Calcium, Ion: 0.84 mmol/L — ABNORMAL LOW (ref 1.12–1.23)
Chloride: 102 mmol/L (ref 101–111)
Creatinine, Ser: 0.9 mg/dL (ref 0.61–1.24)
Glucose, Bld: 70 mg/dL (ref 65–99)
HCT: 52 % (ref 39.0–52.0)
Hemoglobin: 17.7 g/dL — ABNORMAL HIGH (ref 13.0–17.0)
Potassium: 5.8 mmol/L — ABNORMAL HIGH (ref 3.5–5.1)
Sodium: 133 mmol/L — ABNORMAL LOW (ref 135–145)
TCO2: 26 mmol/L (ref 0–100)

## 2016-02-13 LAB — CBC WITH DIFFERENTIAL/PLATELET
Basophils Absolute: 0 10*3/uL (ref 0.0–0.1)
Basophils Relative: 0 %
Eosinophils Absolute: 0 10*3/uL (ref 0.0–0.7)
Eosinophils Relative: 0 %
HCT: 50.3 % (ref 39.0–52.0)
Hemoglobin: 16.5 g/dL (ref 13.0–17.0)
Lymphocytes Relative: 11 %
Lymphs Abs: 1.1 10*3/uL (ref 0.7–4.0)
MCH: 31.6 pg (ref 26.0–34.0)
MCHC: 32.8 g/dL (ref 30.0–36.0)
MCV: 96.4 fL (ref 78.0–100.0)
Monocytes Absolute: 0.6 10*3/uL (ref 0.1–1.0)
Monocytes Relative: 7 %
Neutro Abs: 7.8 10*3/uL — ABNORMAL HIGH (ref 1.7–7.7)
Neutrophils Relative %: 82 %
Platelets: 334 10*3/uL (ref 150–400)
RBC: 5.22 MIL/uL (ref 4.22–5.81)
RDW: 13 % (ref 11.5–15.5)
WBC: 9.6 10*3/uL (ref 4.0–10.5)

## 2016-02-13 LAB — BASIC METABOLIC PANEL
ANION GAP: 14 (ref 5–15)
BUN: 9 mg/dL (ref 6–20)
CALCIUM: 9.1 mg/dL (ref 8.9–10.3)
CHLORIDE: 99 mmol/L — AB (ref 101–111)
CO2: 24 mmol/L (ref 22–32)
Creatinine, Ser: 0.92 mg/dL (ref 0.61–1.24)
GFR calc Af Amer: >60 mL/min (ref >60)
GFR calc non Af Amer: >60 mL/min (ref >60)
GLUCOSE: 79 mg/dL (ref 65–99)
Potassium: 5.1 mmol/L (ref 3.5–5.1)
Sodium: 137 mmol/L (ref 135–145)

## 2016-02-13 LAB — I-STAT CG4 LACTIC ACID, ED
Lactic Acid, Venous: 4.67 mmol/L (ref 0.5–1.9)
Lactic Acid, Venous: 2.37 mmol/L (ref 0.5–1.9)

## 2016-02-13 MED ORDER — HEPARIN BOLUS VIA INFUSION
4000.0000 [IU] | Freq: Once | INTRAVENOUS | Status: AC
  Administered 2016-02-13: 4000 [IU] via INTRAVENOUS
  Filled 2016-02-13: qty 4000

## 2016-02-13 MED ORDER — HEPARIN BOLUS VIA INFUSION: 4000.0000 [IU] | Freq: Once | INTRAVENOUS | Status: DC

## 2016-02-13 MED ORDER — ACETAMINOPHEN 325 MG PO TABS: 650.0000 mg | ORAL_TABLET | Freq: Four times a day (QID) | ORAL | Status: DC | PRN

## 2016-02-13 MED ORDER — INSULIN ASPART 100 UNIT/ML ~~LOC~~ SOLN: 5.0000 [IU] | Freq: Once | SUBCUTANEOUS | Status: DC

## 2016-02-13 MED ORDER — SODIUM CHLORIDE 0.9 % IV BOLUS (SEPSIS)
1000.0000 mL | Freq: Once | INTRAVENOUS | Status: AC
  Administered 2016-02-13: 1000 mL via INTRAVENOUS

## 2016-02-13 MED ORDER — SODIUM CHLORIDE 0.9 % IV SOLN
2.0000 g | Freq: Once | INTRAVENOUS | Status: AC
  Administered 2016-02-13: 2 g via INTRAVENOUS
  Filled 2016-02-13: qty 20

## 2016-02-13 MED ORDER — METOPROLOL TARTRATE 5 MG/5ML IV SOLN
5.0000 mg | Freq: Once | INTRAVENOUS | Status: AC
  Administered 2016-02-13: 5 mg via INTRAVENOUS
  Filled 2016-02-13: qty 5

## 2016-02-13 MED ORDER — IOPAMIDOL (ISOVUE-370) INJECTION 76%
INTRAVENOUS | Status: AC
  Administered 2016-02-13: 100 mL
  Filled 2016-02-13: qty 100

## 2016-02-13 MED ORDER — INSULIN ASPART 100 UNIT/ML ~~LOC~~ SOLN
5.0000 [IU] | Freq: Once | SUBCUTANEOUS | Status: AC
  Administered 2016-02-13: 5 [IU] via INTRAVENOUS
  Filled 2016-02-13: qty 1

## 2016-02-13 MED ORDER — HEPARIN (PORCINE) IN NACL 100-0.45 UNIT/ML-% IJ SOLN: 1000.0000 [IU]/h | INTRAMUSCULAR | Status: DC

## 2016-02-13 MED ORDER — HYDRALAZINE HCL 20 MG/ML IJ SOLN
5.0000 mg | INTRAMUSCULAR | Status: DC | PRN
  Administered 2016-02-14 – 2016-02-15 (×3): 5 mg via INTRAVENOUS
  Filled 2016-02-13 (×4): qty 1

## 2016-02-13 MED ORDER — SODIUM CHLORIDE 0.9 % IV SOLN
INTRAVENOUS | Status: DC
  Administered 2016-02-14 (×2): via INTRAVENOUS

## 2016-02-13 MED ORDER — HYDROCODONE-ACETAMINOPHEN 5-325 MG PO TABS
1.0000 | ORAL_TABLET | ORAL | Status: DC | PRN
  Administered 2016-02-14 – 2016-02-19 (×17): 2 via ORAL
  Filled 2016-02-13 (×17): qty 2

## 2016-02-13 MED ORDER — LABETALOL HCL 5 MG/ML IV SOLN
10.0000 mg | Freq: Once | INTRAVENOUS | Status: AC
  Administered 2016-02-13: 10 mg via INTRAVENOUS
  Filled 2016-02-13: qty 4

## 2016-02-13 MED ORDER — ONDANSETRON HCL 4 MG/2ML IJ SOLN: 4.0000 mg | Freq: Three times a day (TID) | INTRAMUSCULAR | Status: AC | PRN

## 2016-02-13 MED ORDER — ACETAMINOPHEN 650 MG RE SUPP: 650.0000 mg | Freq: Four times a day (QID) | RECTAL | Status: DC | PRN

## 2016-02-13 MED ORDER — SODIUM POLYSTYRENE SULFONATE 15 GM/60ML PO SUSP
30.0000 g | Freq: Once | ORAL | Status: AC
  Administered 2016-02-13: 30 g via ORAL
  Filled 2016-02-13: qty 120

## 2016-02-13 MED ORDER — VERAPAMIL HCL ER 120 MG PO TBCR
120.0000 mg | EXTENDED_RELEASE_TABLET | Freq: Every day | ORAL | Status: DC
  Administered 2016-02-14 – 2016-02-19 (×7): 120 mg via ORAL
  Filled 2016-02-13 (×7): qty 1

## 2016-02-13 MED ORDER — NICOTINE 21 MG/24HR TD PT24
21.0000 mg | MEDICATED_PATCH | TRANSDERMAL | Status: DC
  Administered 2016-02-13 – 2016-02-18 (×6): 21 mg via TRANSDERMAL
  Filled 2016-02-13 (×6): qty 1

## 2016-02-13 MED ORDER — DEXTROSE 50 % IV SOLN
50.0000 mL | Freq: Once | INTRAVENOUS | Status: AC
  Administered 2016-02-13: 50 mL via INTRAVENOUS
  Filled 2016-02-13: qty 50

## 2016-02-13 MED ORDER — CLONIDINE HCL 0.2 MG PO TABS
0.2000 mg | ORAL_TABLET | Freq: Once | ORAL | Status: AC
  Administered 2016-02-13: 0.2 mg via ORAL
  Filled 2016-02-13: qty 1

## 2016-02-13 MED ORDER — HEPARIN (PORCINE) IN NACL 100-0.45 UNIT/ML-% IJ SOLN
900.0000 [IU]/h | INTRAMUSCULAR | Status: DC
Start: 2016-02-13 — End: 2016-02-16
  Administered 2016-02-13: 1000 [IU]/h via INTRAVENOUS
  Administered 2016-02-14 – 2016-02-16 (×2): 900 [IU]/h via INTRAVENOUS
  Filled 2016-02-13 (×3): qty 250

## 2016-02-13 MED ORDER — SODIUM CHLORIDE 0.9% FLUSH
3.0000 mL | Freq: Two times a day (BID) | INTRAVENOUS | Status: DC
  Administered 2016-02-13 – 2016-02-19 (×7): 3 mL via INTRAVENOUS

## 2016-02-13 NOTE — ED Notes (Signed)
Report called to Uplandhristy, RN at this time.  Receiving nurse denies having any further questions at this time.

## 2016-02-13 NOTE — ED Notes (Signed)
Gave pt water, per Tim - RN.

## 2016-02-13 NOTE — Consult Note (Signed)
Hospital Consult    Reason for Consult:  Bilateral lower extremity pain Referring Physician:  Radford Pax (ED) MRN #:  [id number]  History of Present Illness: This is a 60 y.o. male with history of peripheral vascular disease claudication previously was right symptoms worse than left. He was evaluated earlier this year with angiogram and determination was to treat him medically. He now states 2 months ago of his left lower extremity is much worse than his right symptoms of rest pain in the left foot. He presents today after an episode of claudication that did not subside with rest that occurred approximately 9 AM today. He has been out of his antihypertensive medications currently not taking any medications. A previous angiogram he was noted to have dominant PT runoff bilaterally and at that time had ABIs of 0.4 on the right 0.7 on the left. CTscan today demonstrates occlusion of the left left SFA with ABI now 0.2 on the left and remains 0.4 on Right. He also did not have suitable vein for bypass at last check. Continues to smoke at least 0.5 ppd. He can still feel and move both feet.  Past Medical History:  Diagnosis Date  . HTN (hypertension)     Past Surgical History:  Procedure Laterality Date  . PERIPHERAL VASCULAR CATHETERIZATION N/A 07/07/2015   Procedure: Abdominal Aortogram w/Lower Extremity;  Surgeon: Fransisco Hertz, MD;  Location: [facility] INVASIVE CV LAB;  Service: Cardiovascular;  Laterality: N/A;  . 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    No Known Allergies  Prior to Admission medications   Medication Sig Start Date End Date Taking? Authorizing Provider  lisinopril-hydrochlorothiazide (ZESTORETIC) 10-12.5 MG tablet Take 1 tablet by mouth daily. 06/29/15  Yes Melton Krebs, PA-C  nicotine (NICODERM CQ - DOSED IN MG/24 HOURS) 21 mg/24hr patch Place 1 patch (21 mg total) onto the skin daily. 08/31/15  Yes Selina Cooley, MD  nicotine polacrilex  (NICORETTE) 4 MG gum Take 1 each (4 mg total) by mouth as needed for smoking cessation. 08/31/15  Yes Selina Cooley, MD  selenium sulfide (SELSUN) 1 % LOTN Put on your face in the shower and wash off daily 08/31/15  Yes Selina Cooley, MD  verapamil (CALAN-SR) 120 MG CR tablet Take 1 tablet (120 mg total) by mouth daily. 08/31/15 08/30/16 Yes Selina Cooley, MD    Social History   Social History  . Marital status: Single    Spouse name: N/A  . Number of children: N/A  . Years of education: N/A   Occupational History  . Not on file.   Social History Main Topics  . Smoking status: Current Every Day Smoker    Packs/day: 0.50    Years: 40.00    Types: Cigarettes  . Smokeless tobacco: Never Used     Comment: cutting back amount he is buying  . Alcohol use 8.4 oz/week    14 Cans of beer per week  . Drug use: No  . Sexual activity: Yes    Birth control/ protection: Condom   Other Topics Concern  . Not on file   Social History Narrative   Smoking cigarettes since 60 y.o   Drinks beer 24 oz every other day                        Family History  Problem Relation Age of Onset  . Cancer Mother     deceased in  70s   . Heart attack Mother     diseased  . Heart disease Father   . Stroke Sister     age 4850s  . Heart disease Brother     pacemaker in his 2550s    ROS: [x]  Positive   [ ]  Negative   [ ]  All sytems reviewed and are negative  Cardiovascular: []  chest pain/pressure []  palpitations []  SOB lying flat []  DOE [x]  pain in legs while walking [x]  pain in legs at rest [x]  pain in legs at night []  non-healing ulcers [x]  hx of DVT []  swelling in legs  Pulmonary: []  productive cough []  asthma/wheezing []  home O2  Neurologic: []  weakness in []  arms []  legs []  numbness in []  arms []  legs []  hx of CVA []  mini stroke [] difficulty speaking or slurred speech []  temporary loss of vision in one eye []  dizziness  Hematologic: []  hx of cancer []  bleeding problems  Endocrine:     []  diabetes  GI []  vomiting blood []  blood in stool  GU: []  CKD/renal failure  []  burning with urination []  blood in urine  Psychiatric: []  anxiety []  depression  Musculoskeletal: []  arthritis []  joint pain  Integumentary: []  rashes []  ulcers  Constitutional: []  fever []  chills []  weight loss [] fatigue   Physical Examination  Vitals:   02/13/16 2046 02/13/16 2100  BP: (!) 224/126 (!) 175/113  Pulse: 92 98  Resp: 14 15  Temp:     Body mass index is 21.29 kg/m.  General:  WDWN in NAD Gait: Not observed HENT: WNL, normocephalic Pulmonary: normal non-labored breathing, without Rales, rhonchi,  wheezing Cardiac: tachy to low 100's Abdomen:  soft, NT/ND, no masses  Vascular Exam/Pulses:  Right Left  Radial 2+ (normal) 2+ (normal)  Femoral 2+ (normal) 2+ (normal)  Popliteal 1+ (weak) absent  DP signal absent  PT signal Monophasic signal   Extremities: without ischemic changes, without Gangrene , without cellulitis; without open wounds;  Musculoskeletal: no muscle wasting or atrophy  Neurologic: sensation in tact in both feet, moving both with normal strength   CBC    Component Value Date/Time   WBC 9.6 02/13/2016 1519   RBC 5.22 02/13/2016 1519   HGB 17.7 (H) 02/13/2016 1621   HCT 52.0 02/13/2016 1621   PLT 334 02/13/2016 1519   MCV 96.4 02/13/2016 1519   MCH 31.6 02/13/2016 1519   MCHC 32.8 02/13/2016 1519   RDW 13.0 02/13/2016 1519   LYMPHSABS 1.1 02/13/2016 1519   MONOABS 0.6 02/13/2016 1519   EOSABS 0.0 02/13/2016 1519   BASOSABS 0.0 02/13/2016 1519    BMET    Component Value Date/Time   NA 133 (L) 02/13/2016 1621   K 5.8 (H) 02/13/2016 1621   CL 102 02/13/2016 1621   CO2 24 02/13/2016 1610   GLUCOSE 70 02/13/2016 1621   BUN 15 02/13/2016 1621   CREATININE 0.90 02/13/2016 1621   CREATININE 0.92 11/13/2013 1031   CALCIUM 9.1 02/13/2016 1610   GFRNONAA >60 02/13/2016 1610   GFRNONAA >89 11/13/2013 1031   GFRAA >60 02/13/2016 1610    GFRAA >89 11/13/2013 1031     Bedside ABI based on PT artery bilaterally R 0.4/ L < 0.2  IMPRESSION: 1. Advanced peripheral vascular disease with bilateral occlusions that appear compensated. 2. Right SFA occlusion near the adductor hiatus with infrapopliteal reconstitution and three-vessel runoff. Heavily diseased profunda femoris with early branch occlusions. 3. Early left SFA occlusion with thready discontinuous popliteal and below the knee  reconstitution. Single vessel/posterior tibial runoff at the ankle. 4. No flow limiting aortic or iliac stenosis. 5. Moderate right renal ostial stenosis.  ASSESSMENT/PLAN: This is a 60 y.o. male with history of peripheral arterial disease now presenting with an acute presentation of what is likely a chronic problem. He previously had right greater than left claudication but now exhibits symptoms of left-sided rest pain with bedside ABI that is less than 0.2 decreased from 0.7 at last check. This is a patient with lack of vein for bypass in his bilateral lower extremities with dominant single-vessel runoff PT arteries bilaterally. He has a poor candidate for open intervention given his multiple level disease. Currently does not have limb threatening ischemia and so will likely benefit angiogram at the earliest on Wednesday given contrast load tonight. He's been dosed with heparin in the emergency department should also be on aspirin and statin medications. He merits admission to the hospital for antihypertensive medications and hydration prior to planned angiogram. We will also obtain formal ABI tomorrow. He has been counseled on the severity of his disease given his low ABI could end up losing one or both of his lower extremities in the very near future especially if he does not quit smoking. He is agreeable to proceed with endovascular intervention on Wednesday shouldhis hypertension be controlled and hydration status improve.  Angenette Daily C. Randie Heinzain,  MD [facility] [place] Office: [phone] Pager: [phone]

## 2016-02-13 NOTE — Progress Notes (Signed)
ANTICOAGULATION CONSULT NOTE - Initial Consult  Pharmacy Consult for Heparin Indication: arterial occlusion  No Known Allergies  Patient Measurements: Height: 5\' 8"  (172.7 cm) Weight: 140 lb (63.5 kg) IBW/kg (Calculated) : 68.4 Heparin Dosing Weight: 63.5 kg  Vital Signs: Temp: 98 F (36.7 C) (08/21 1419) Temp Source: Oral (08/21 1419) BP: 189/118 (08/21 1900) Pulse Rate: 87 (08/21 1900)  Labs:  Recent Labs  02/13/16 1519 02/13/16 1610 02/13/16 1621  HGB 16.5  --  17.7*  HCT 50.3  --  52.0  PLT 334  --   --   CREATININE  --  0.92 0.90    Estimated Creatinine Clearance: 78.4 mL/min (by C-G formula based on SCr of 0.9 mg/dL).   Medical History: Past Medical History:  Diagnosis Date  . HTN (hypertension)     Assessment: 60 yo M presents with bilateral leg pain. Previously diagnosed with popliteal artery blockage in January 2017 too small for treatment per patient. CT today shows advanced peripheral vascular disease with bilateral occlusions that appear compensated, R SFA occlusion, and L SFA occlusion. Hgb 16.5, plt 334.   Goal of Therapy:  Heparin level 0.3-0.7 units/ml Monitor platelets by anticoagulation protocol: Yes   Plan:  Give 4000 units bolus x 1 Start heparin infusion at 1000 units/hr Check anti-Xa level in 6 hours and daily while on heparin Continue to monitor H&H and platelets  Fredonia HighlandMichael Wendall Isabell, PharmD PGY-1 Pharmacy Resident Pager: [phone]

## 2016-02-13 NOTE — H&P (Signed)
History and Physical    Stephen Bowen JXB:[id number] DOB: 1956/05/20 DOA: 02/13/2016  Referring MD/NP/PA:   PCP: PROVIDER NOT IN SYSTEM   Patient coming from:  The patient is coming from home.  At baseline, pt is independent for most of ADL.      Chief Complaint: Bilateral leg pain  HPI: Stephen Bowen is a 60 y.o. male with medical history significant of PAD with claudication, hypertension, tobacco abuse, medication noncompliance, who presents with bilateral leg pain.  Pt states that he has hx of PAD. He had Doppler ultrasound done on 06/29/15 which showed 50-99% stenosis of the right profunda femoral artery; 50-99% stenosis of the distal right femoral artery and occlusion of the right popliteal artery with collateral flow. Patient state he was seen by vascular surgeon for follow-up and determination was to treat him medically. He states that he's been out of his blood pressure medicine for the last 2-3 months. He continues to smoke.   Pt stats that previously his leg pain was worse on the right, but recently he has worsening pain in the left leg. He has left leg and foot pain in the rest, which is worse on walking. He states that his left leg pain has much worsened since this AM, which did not subside with rest. The pain is constantly, 9 out of 10 in severity, sharp. It is aggravated by walking and slightly better with rest, but not completely resolved. He denies chest pain, shortness of breath, cough, fever, chills. No nausea, vomiting, diarrhea, abdominal pain, symptoms of UTI. No unilateral weakness, vision change or hearing loss. Of note, he also did not have suitable vein for bypass at last check.  ED Course: pt was found to have elevated blood pressure at 226/145, which improved to 138/98 after treated with 1 dose of clonidine 0.2 mg, metoprolol 5 mg and labetalol 10 mg x 2 in the ED. Potassium 5.8 with T-wave peaking in V3-V4, WBC 9.6, temperature normal, tachycardia, elevated lactate  4.67-->2.37. CT ANGIO AO+BIFEM W &/OR WO CONTRAST showed occlusion of the left left SFA with ABI now 0.2 on the left and remains 0.4 on Right. Pt is admitted to telemetry bed for further evaluation and treatment as inpatient. IV heparin was started. VVS, Dr. Randie Heinz was consulted  Review of Systems:   General: no fevers, chills, no changes in body weight, has poor appetite, has fatigue HEENT: no blurry vision, hearing changes or sore throat Respiratory: no dyspnea, coughing, wheezing CV: no chest pain, no palpitations GI: no nausea, vomiting, abdominal pain, diarrhea, constipation GU: no dysuria, burning on urination, increased urinary frequency, hematuria  Ext: no leg edema. Has bilateral leg pain  Neuro: no unilateral weakness, numbness, or tingling, no vision change or hearing loss Skin: no rash MSK: No muscle spasm, no deformity, no limitation of range of movement in spin Heme: No easy bruising.  Travel history: No recent long distant travel.  Allergy: No Known Allergies  Past Medical History:  Diagnosis Date  . HTN (hypertension)     Past Surgical History:  Procedure Laterality Date  . PERIPHERAL VASCULAR CATHETERIZATION N/A 07/07/2015   Procedure: Abdominal Aortogram w/Lower Extremity;  Surgeon: Fransisco Hertz, MD;  Location: [facility] INVASIVE CV LAB;  Service: Cardiovascular;  Laterality: N/A;  . 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    Social History:  reports that he has been smoking Cigarettes.  He has a 20.00 pack-year smoking history.  He has never used smokeless tobacco. He reports that he drinks about 8.4 oz of alcohol per week . He reports that he does not use drugs.  Family History:  Family History  Problem Relation Age of Onset  . Cancer Mother     deceased in 4670s   . Heart attack Mother     diseased  . Heart disease Father   . Stroke Sister     age 5250s  . Heart disease Brother     pacemaker in his 8050s     Prior to  Admission medications   Medication Sig Start Date End Date Taking? Authorizing Provider  lisinopril-hydrochlorothiazide (ZESTORETIC) 10-12.5 MG tablet Take 1 tablet by mouth daily. 06/29/15  Yes Melton KrebsSamantha Nicole Riley, PA-C  nicotine (NICODERM CQ - DOSED IN MG/24 HOURS) 21 mg/24hr patch Place 1 patch (21 mg total) onto the skin daily. 08/31/15  Yes Selina CooleyKyle Flores, MD  nicotine polacrilex (NICORETTE) 4 MG gum Take 1 each (4 mg total) by mouth as needed for smoking cessation. 08/31/15  Yes Selina CooleyKyle Flores, MD  selenium sulfide (SELSUN) 1 % LOTN Put on your face in the shower and wash off daily 08/31/15  Yes Selina CooleyKyle Flores, MD  verapamil (CALAN-SR) 120 MG CR tablet Take 1 tablet (120 mg total) by mouth daily. 08/31/15 08/30/16 Yes Selina CooleyKyle Flores, MD    Physical Exam: Vitals:   02/13/16 2000 02/13/16 2041 02/13/16 2046 02/13/16 2100  BP: (!) 211/126 (!) 226/145 (!) 224/126 (!) 175/113  Pulse: 95 97 92 98  Resp: 16 19 14 15   Temp:      TempSrc:      SpO2: 96% 97% 97% 94%  Weight:      Height:       General: Not in acute distress HEENT:       Eyes: PERRL, EOMI, no scleral icterus.       ENT: No discharge from the ears and nose, no pharynx injection, no tonsillar enlargement.        Neck: No JVD, no bruit, no mass felt. Heme: No neck lymph node enlargement. Cardiac: S1/S2, RRR, No murmurs, No gallops or rubs. Respiratory: Good air movement bilaterally. No rales, wheezing, rhonchi or rubs. GI: Soft, nondistended, nontender, no rebound pain, no organomegaly, BS present. GU: No hematuria Ext: No pitting leg edema bilaterally. Absence of left DP/PT pulse and cold left foot. Faint DP/PT pulse in the right leg. Musculoskeletal: No joint deformities, No joint redness or warmth, no limitation of ROM in spin. Skin: No rashes.  Neuro: Alert, oriented X3, cranial nerves II-XII grossly intact, moves all extremities normally.  Psych: Patient is not psychotic, no suicidal or hemocidal ideation.  Labs on Admission: I have  personally reviewed following labs and imaging studies  CBC:  Recent Labs Lab 02/13/16 1519 02/13/16 1621  WBC 9.6  --   NEUTROABS 7.8*  --   HGB 16.5 17.7*  HCT 50.3 52.0  MCV 96.4  --   PLT 334  --    Basic Metabolic Panel:  Recent Labs Lab 02/13/16 1610 02/13/16 1621  NA 137 133*  K 5.1 5.8*  CL 99* 102  CO2 24  --   GLUCOSE 79 70  BUN 9 15  CREATININE 0.92 0.90  CALCIUM 9.1  --    GFR: Estimated Creatinine Clearance: 78.4 mL/min (by C-G formula based on SCr of 0.9 mg/dL). Liver Function Tests: No results for input(s): AST, ALT, ALKPHOS, BILITOT, PROT, ALBUMIN in the last 168 hours. No results for  input(s): LIPASE, AMYLASE in the last 168 hours. No results for input(s): AMMONIA in the last 168 hours. Coagulation Profile: No results for input(s): INR, PROTIME in the last 168 hours. Cardiac Enzymes: No results for input(s): CKTOTAL, CKMB, CKMBINDEX, TROPONINI in the last 168 hours. BNP (last 3 results) No results for input(s): PROBNP in the last 8760 hours. HbA1C: No results for input(s): HGBA1C in the last 72 hours. CBG: No results for input(s): GLUCAP in the last 168 hours. Lipid Profile: No results for input(s): CHOL, HDL, LDLCALC, TRIG, CHOLHDL, LDLDIRECT in the last 72 hours. Thyroid Function Tests: No results for input(s): TSH, T4TOTAL, FREET4, T3FREE, THYROIDAB in the last 72 hours. Anemia Panel: No results for input(s): VITAMINB12, FOLATE, FERRITIN, TIBC, IRON, RETICCTPCT in the last 72 hours. Urine analysis:    Component Value Date/Time   COLORURINE YELLOW 11/13/2013 1634   APPEARANCEUR CLEAR 11/13/2013 1634   LABSPEC 1.009 11/13/2013 1634   PHURINE 7.5 11/13/2013 1634   GLUCOSEU NEGATIVE 11/13/2013 1634   HGBUR SMALL (A) 11/13/2013 1634   BILIRUBINUR NEGATIVE 11/13/2013 1634   KETONESUR NEGATIVE 11/13/2013 1634   PROTEINUR NEGATIVE 11/13/2013 1634   UROBILINOGEN 0.2 11/13/2013 1634   NITRITE NEGATIVE 11/13/2013 1634   LEUKOCYTESUR NEGATIVE  11/13/2013 1634   Sepsis Labs: @LABRCNTIP (procalcitonin:4,lacticidven:4) )No results found for this or any previous visit (from the past 240 hour(s)).   Radiological Exams on Admission: Ct Angio Ao+bifem W &/or Wo Contrast  Result Date: 02/13/2016 CLINICAL DATA:  Pain aggravated by walking. No rest pain and intact pulses per report. EXAM: CT ANGIOGRAPHY AOBIFEM WITHOUT AND WITH CONTRAST TECHNIQUE: CTA of the abdominal aorta bilateral lower extremities was performed in the arterial phase. Multiplanar reformats were generated. CONTRAST:  100 cc Isovue 370 intravenous COMPARISON:  None. FINDINGS: Vascular: Aorta: Standard aortic branching. Diffuse atheromatous wall thickening and calcification. No aneurysm or dissection. Visceral branches: No major branch occlusion. The celiac and SMA are without flow limiting stenosis. Moderate right renal ostial stenosis. Patent IMA. Right Leg: Iliacs: Diffuse atheromatous changes. No common or external iliac flow limiting stenosis. Most branches of the hypogastric artery are occluded. Negative for aneurysm. Common femoral: Heavily diseased with atheromatous plaque. No flow limiting stenosis. Superficial femoral artery: Moderate narrowing just beyond the common femoral bifurcation. Occlusion near at the hiatus with well-formed muscular collaterals proximal to the occlusion. Profunda femoris artery: Heavily diseased and stenotic proximally with first order branch occlusion. The remaining arterial bed is occluded after muscular branches into the medial compartment. Popliteal artery:  Occluded throughout its extent. Below the knee: Collateral flow reconstitutes the anterior tibial artery. The tibioperoneal trunk, tibial, and peroneal arteries are thready proximally but are ramified distally and there is essentially three-vessel runoff that is symmetrically timed to the left. Left leg: Iliacs: Approximately 50% stenosis of the common iliac artery by low-density plaque. Multiple  branches of the hypogastric artery are occluded. Common femoral: Heavily diseased with atheromatous plaque. No flow limiting stenosis. Superficial femoral: Occluded from its origin to near the hiatus where there are well-formed collaterals. Profunda femoris: Moderate to advanced stenosis at the origin with diffuse atheromatous narrowing and luminal irregularity, but patent serving multiple muscular branches in the thigh. Popliteal artery: Reconstituted below the hiatus by well-formed collaterals. Heavily diseased throughout its course with multi focal high-grade stenosis. At the level of the knee there is bulky calcified plaque. Short segment occlusion below the knee. Below the knee: No flow until collaterals weakly reconstitute the anterior tibial artery which is thready at the ankle.  Collateral also reconstitutes the posterior tibial which is thready at the ankle. No peroneal reconstitution. Nonvascular: Lower chest and abdominal wall:  No contributory findings. Hepatobiliary: No focal liver abnormality.No evidence of biliary obstruction or stone. Pancreas: Unremarkable. Spleen: Hypervascular mass in the medial spleen measuring 9 mm, nonspecific but often incidental hemangioma. Adrenals/Urinary Tract: Negative adrenals. Symmetric renal enhancement. No hydronephrosis. Excretion of contrast limits detection of urolithiasis. Unremarkable bladder. Stomach/Bowel:  No obstruction. No inflammation. Reproductive:No pathologic findings. Vascular/Lymphatic: No acute vascular abnormality. No mass or adenopathy. Other: No ascites or pneumoperitoneum. Musculoskeletal: No acute abnormalities. Remote left tibia fracture status post ORIF with intra medullary nail. Remote and healed fibular diaphysis fracture on the left. These results were called by telephone at the time of interpretation on 02/13/2016 at ~7:10 pm to Dr. Nelva NayOBERT BEATON , who verbally acknowledged these results. Review of the MIP images confirms the above findings.  IMPRESSION: 1. Advanced peripheral vascular disease with bilateral occlusions that appear compensated. 2. Right SFA occlusion near the adductor hiatus with infrapopliteal reconstitution and three-vessel runoff. Heavily diseased profunda femoris with early branch occlusions. 3. Early left SFA occlusion with thready discontinuous popliteal and below the knee reconstitution. Single vessel/posterior tibial runoff at the ankle. 4. No flow limiting aortic or iliac stenosis. 5. Moderate right renal ostial stenosis. Electronically Signed   By: Marnee SpringJonathon  Watts M.D.   On: 02/13/2016 19:21     EKG: Independently reviewed.  Sinus rhythm, QTC 443, T-wave peaking in V3 to V4 Assessment/Plan Principal Problem:   PAD (peripheral artery disease) (HCC) Active Problems:   Hypertensive urgency   Atherosclerosis of native arteries of extremity with intermittent claudication (HCC)   Tobacco use disorder   Leg pain   Hyperkalemia   Lactic acid increased   PAD: pt has advanced peripheral vascular disease with bilateral occlusions that appear compensated. VVS. Dr. Randie Heinzain was consulted. Per Dr. Randie Heinzain, pt is a poor candidate for open intervention given his multiple level disease; currently pt does not have limb threatening ischemia; will likely benefit angiogram at the earliest on Wednesday given contrast load tonight. Per Dr. Randie Heinzain, pt needs to be on aspirin and statin medications.  -will admit to tele bed -Highly appreciate Dr. Darcella Cheshireain's consultation -continue IV heparin -will get ABI  -will start ASA and lipitor -check A1c and FLP, UDS -pain control: prn percocet  Lactic acid increased: most likely due to limb ischemia. Lactic acid level is trending down 4.67-->2.37 -treat limb ischemia as above -IVF: 1L of NS bolus in ED, followed by 100 cc/h  -trend lactic acid levels  Hyperkalemia: Potassium 5.8 with T-wave peaking in V3-V4 on EKG. -treated with D50, 5 unit of Novolog by IV, 2g of calcium chloride, 30 g of  Kayexalate -f/u by BMP  Hypertensive urgency: his bp was elevated blood pressure at 226/145, which improved to 138/98 after treated with 1 dose of clonidine 0.2 mg, metoprolol 5 mg and labetalol 10 mg x 2 in the ED.  -hold Lisinopril-HCTZ due to hyperkalemia and the need of IV fluid -resume Verapamil 120 mg daily -IV hydralazine when necessary  Tobacco abuse: -Did counseling about importance of quitting smoking -Nicotine patch  DVT ppx: on IV Heparin   Code Status: Full code Family Communication: None at bed side. Disposition Plan:  Anticipate discharge back to previous home environment Consults called:  VVS, Dr. Randie Heinzcain Admission status:  Inpatient/tele  Date of Service 02/13/2016    Lorretta HarpNIU, Matthias Bogus [facility] Pager [phone]  If 7PM-7AM, please contact night-coverage [email or web address] Password  [email or web address] 02/13/2016, 10:02 PM

## 2016-02-13 NOTE — ED Provider Notes (Signed)
[facility]-EMER[facility]ENCY DEPT Provider Note   CSN: [id number] Arrival date & time: 02/13/16  1412     History   Chief Complaint Chief Complaint  Patient presents with  . Leg Pain    HPI Stephen Bowen is a 60 y.o. male.  HPI C/o bilateral leg pain worsen after walking to store  And back To buy 40 oz beer, states has been out htn meds and pain meds x 2 -3 months.  Patient had a Doppler ultrasound done of right leg which showed significant blockage in the popliteal artery back in January 2017.  Patient state he went to vascular surgeon for follow-up who told him that the blockage was not big enough for surgery.  Patient continues to smoke.  He's been out of his blood pressure medicine for the last 2-3 months.  Denies cocaine abuse. Past Medical History:  Diagnosis Date  . HTN (hypertension)   . PAD (peripheral artery disease) [facility](HCC)     Patient Active Problem List   Diagnosis Date Noted  . Preoperative cardiovascular examination   . PAD (peripheral artery disease) (HCC) 02/13/2016  . Leg pain 02/13/2016  . Hyperkalemia 02/13/2016  . Lactic acid increased 02/13/2016  . Uncontrolled hypertension 02/13/2016  . Seborrheic dermatitis 08/31/2015  . Actinic keratosis of multiple sites of head and neck 08/31/2015  . Tobacco use disorder 08/31/2015  . Skin ulcer of scalp (HCC) 08/31/2015  . Atherosclerosis of native arteries of extremity with intermittent claudication (HCC) 07/07/2015  . Hypertensive urgency 11/13/2013    Past Surgical History:  Procedure Laterality Date  . ENDARTERECTOMY FEMORAL Left 02/16/2016   Procedure: LEFT FEMORAL ENDARTERECTOMY;  Surgeon: Maeola HarmanBrandon Christopher Cain, MD;  [facility];  Service: Vascular;  Laterality: Left;  . FEMORAL BYPASS  02/16/2016   LEFT FEMORAL-POSTERIOR TIBIAL ARTERY BYPASS  WITH PROPATEN 6 MM X 80 CM VASCULAR RING GRAFT (Left)  . FEMORAL-TIBIAL BYPASS GRAFT Left 02/16/2016   Procedure: LEFT FEMORAL-POSTERIOR TIBIAL ARTERY BYPASS  WITH  PROPATEN 6 MM X 80 CM VASCULAR RING GRAFT;  Surgeon: Maeola HarmanBrandon Christopher Cain, MD;  [facility];  Service: Vascular;  Laterality: Left;  . PATCH ANGIOPLASTY Left 02/16/2016   Procedure: PATCH ANGIOPLASTY WITH Livia SnellenXENOSURE BIOLOGIC PATCH 1 CM X 6 CM;  Surgeon: Maeola HarmanBrandon Christopher Cain, MD;  [facility];  Service: Vascular;  Laterality: Left;  . PERIPHERAL VASCULAR CATHETERIZATION N/A 07/07/2015   Procedure: Abdominal Aortogram w/Lower Extremity;  Surgeon: Fransisco HertzBrian L Chen, MD;  [facility];  Service: Cardiovascular;  Laterality: N/A;  . PERIPHERAL VASCULAR CATHETERIZATION N/A 02/15/2016   Procedure: Abdominal Aortogram w/Lower Extremity;  Surgeon: Maeola HarmanBrandon Christopher Cain, MD;  [facility];  Service: Cardiovascular;  Laterality: N/A;  . 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       Home Medications    Prior to Admission medications   Medication Sig Start Date End Date Taking? Authorizing Provider  lisinopril-hydrochlorothiazide (ZESTORETIC) 10-12.5 MG tablet Take 1 tablet by mouth daily. 06/29/15  Yes Melton KrebsSamantha Nicole Riley, PA-C  nicotine (NICODERM CQ - DOSED IN MG/24 HOURS) 21 mg/24hr patch Place 1 patch (21 mg total) onto the skin daily. 08/31/15  Yes Selina CooleyKyle Flores, MD  nicotine polacrilex (NICORETTE) 4 MG gum Take 1 each (4 mg total) by mouth as needed for smoking cessation. 08/31/15  Yes Selina CooleyKyle Flores, MD  selenium sulfide (SELSUN) 1 % LOTN Put on your face in the shower and wash off daily  08/31/15  Yes Selina Cooley, MD  verapamil (CALAN-SR) 120 MG CR tablet Take 1 tablet (120 mg total) by mouth daily. 08/31/15 08/30/16 Yes Selina Cooley, MD    Family History Family History  Problem Relation Age of Onset  . Cancer Mother     deceased in 3s   . Heart attack Mother     diseased  . Heart disease Father   . Stroke Sister     age 53s  . Heart disease Brother     pacemaker in his 63s    Social History Social History    Substance Use Topics  . Smoking status: Current Every Day Smoker    Packs/day: 0.50    Years: 40.00    Types: Cigarettes  . Smokeless tobacco: Never Used     Comment: cutting back amount he is buying  . Alcohol use 8.4 oz/week    14 Cans of beer per week     Comment: Drinks a 40 oz "tallboy" daily     Allergies   Review of patient's allergies indicates no known allergies.   Review of Systems Review of Systems  All other systems reviewed and are negative.    Physical Exam Updated Vital Signs BP (!) 161/76 (BP Location: Left Arm)   Pulse 98   Temp 99.2 F (37.3 C) (Oral)   Resp 18   Ht 5\' 8"  (1.727 m)   Wt 125 lb 9.6 oz (57 kg)   SpO2 98%   BMI 19.10 kg/m   Physical Exam  Constitutional: He is oriented to person, place, and time. He appears well-developed and well-nourished. No distress.  HENT:  Head: Normocephalic and atraumatic.  Eyes: Pupils are equal, round, and reactive to light.  Neck: Normal range of motion.  Cardiovascular: Normal heart sounds.  Tachycardia present.   Pulses:      Dorsalis pedis pulses are 1+ on the right side, and 1+ on the left side.       Posterior tibial pulses are 0 on the right side.  Pulmonary/Chest: Effort normal and breath sounds normal. No respiratory distress.  Abdominal: Soft. Normal appearance. He exhibits no distension and no mass. There is no guarding.  Musculoskeletal: Normal range of motion. He exhibits no edema.  Neurological: He is alert and oriented to person, place, and time. No cranial nerve deficit.  Skin: Skin is warm and dry. No rash noted.  Psychiatric: He has a normal mood and affect. His behavior is normal.  Nursing note and vitals reviewed.     ED Treatments / Results  Labs (all labs ordered are listed, but only abnormal results are displayed) Labs Reviewed  CBC WITH DIFFERENTIAL/PLATELET - Abnormal; Notable for the following:       Result Value   Neutro Abs 7.8 (*)    All other components within  normal limits  BASIC METABOLIC PANEL - Abnormal; Notable for the following:    Chloride 99 (*)    All other components within normal limits  HEPARIN LEVEL (UNFRACTIONATED) - Abnormal; Notable for the following:    Heparin Unfractionated 0.85 (*)    All other components within normal limits  CBC - Abnormal; Notable for the following:    RBC 4.20 (*)    All other components within normal limits  LACTIC ACID, PLASMA - Abnormal; Notable for the following:    Lactic Acid, Venous 2.5 (*)    All other components within normal limits  APTT - Abnormal; Notable for the following:    aPTT  123 (*)    All other components within normal limits  COMPREHENSIVE METABOLIC PANEL - Abnormal; Notable for the following:    Calcium 8.8 (*)    Total Protein 6.1 (*)    Albumin 3.1 (*)    Total Bilirubin 1.5 (*)    All other components within normal limits  CBC - Abnormal; Notable for the following:    RBC 4.07 (*)    Hemoglobin 12.6 (*)    All other components within normal limits  BASIC METABOLIC PANEL - Abnormal; Notable for the following:    Sodium 134 (*)    Potassium 3.4 (*)    BUN <5 (*)    Calcium 8.0 (*)    All other components within normal limits  CBC - Abnormal; Notable for the following:    RBC 4.00 (*)    Hemoglobin 12.4 (*)    HCT 38.8 (*)    All other components within normal limits  BASIC METABOLIC PANEL - Abnormal; Notable for the following:    Chloride 100 (*)    BUN <5 (*)    Calcium 8.4 (*)    All other components within normal limits  CBC - Abnormal; Notable for the following:    RBC 3.07 (*)    Hemoglobin 9.6 (*)    HCT 30.0 (*)    All other components within normal limits  BASIC METABOLIC PANEL - Abnormal; Notable for the following:    Sodium 134 (*)    Chloride 98 (*)    Calcium 8.2 (*)    All other components within normal limits  HEPARIN LEVEL (UNFRACTIONATED) - Abnormal; Notable for the following:    Heparin Unfractionated <0.10 (*)    All other components  within normal limits  CBC - Abnormal; Notable for the following:    RBC 2.92 (*)    Hemoglobin 9.2 (*)    HCT 28.5 (*)    All other components within normal limits  BASIC METABOLIC PANEL - Abnormal; Notable for the following:    Chloride 99 (*)    Glucose, Bld 115 (*)    BUN <5 (*)    Calcium 8.4 (*)    All other components within normal limits  PROTIME-INR - Abnormal; Notable for the following:    Prothrombin Time 15.6 (*)    All other components within normal limits  I-STAT CG4 LACTIC ACID, ED - Abnormal; Notable for the following:    Lactic Acid, Venous 4.67 (*)    All other components within normal limits  I-STAT CHEM 8, ED - Abnormal; Notable for the following:    Sodium 133 (*)    Potassium 5.8 (*)    Calcium, Ion 0.84 (*)    Hemoglobin 17.7 (*)    All other components within normal limits  I-STAT CG4 LACTIC ACID, ED - Abnormal; Notable for the following:    Lactic Acid, Venous 2.37 (*)    All other components within normal limits  SURGICAL PCR SCREEN  URINE RAPID DRUG SCREEN, HOSP PERFORMED  PROTIME-INR  HEMOGLOBIN A1C  LACTIC ACID, PLASMA  LIPID PANEL  HEPARIN LEVEL (UNFRACTIONATED)  HEPARIN LEVEL (UNFRACTIONATED)  HEPARIN LEVEL (UNFRACTIONATED)  PROTIME-INR    EKG  EKG Interpretation  Date/Time:  Monday February 13 2016 14:18:00 EDT Ventricular Rate:  112 PR Interval:    QRS Duration: 79 QT Interval:  324 QTC Calculation: 443 R Axis:   55 Text Interpretation:  Sinus tachycardia Probable left atrial enlargement Left ventricular hypertrophy Anterior infarct, old Abnormal ekg Confirmed  by Radford PaxBEATON  MD, Corbet Hanley [phone]([id number]) on 02/13/2016 3:47:42 PM       Radiology No results found.  CT Angiogram: IMPRESSION: 1. Advanced peripheral vascular disease with bilateral occlusions that appear compensated. 2. Right SFA occlusion near the adductor hiatus with infrapopliteal reconstitution and three-vessel runoff. Heavily diseased profunda femoris with early branch  occlusions. 3. Early left SFA occlusion with thready discontinuous popliteal and below the knee reconstitution. Single vessel/posterior tibial runoff at the ankle. 4. No flow limiting aortic or iliac stenosis. 5. Moderate right renal ostial stenosis. Procedures Procedures (including critical care time)  Medications Ordered in ED Medications  hydrALAZINE (APRESOLINE) injection 5 mg ( Intravenous MAR Unhold 02/16/16 1649)  ondansetron (ZOFRAN) injection 4 mg (not administered)  nicotine (NICODERM CQ - dosed in mg/24 hours) patch 21 mg (21 mg Transdermal Patch Applied 02/17/16 2222)  verapamil (CALAN-SR) CR tablet 120 mg (120 mg Oral Given 02/17/16 0933)  sodium chloride flush (NS) 0.9 % injection 3 mL (0 mLs Intravenous Duplicate 02/17/16 1000)  acetaminophen (TYLENOL) tablet 650 mg ( Oral MAR Unhold 02/16/16 1649)    Or  acetaminophen (TYLENOL) suppository 650 mg ( Rectal MAR Unhold 02/16/16 1649)  HYDROcodone-acetaminophen (NORCO/VICODIN) 5-325 MG per tablet 1-2 tablet (2 tablets Oral Given 02/17/16 2232)  aspirin tablet 325 mg (325 mg Oral Given 02/17/16 0933)  atorvastatin (LIPITOR) tablet 40 mg (40 mg Oral Given 02/17/16 1741)  lisinopril (PRINIVIL,ZESTRIL) tablet 10 mg (10 mg Oral Given 02/17/16 0933)    And  hydrochlorothiazide (MICROZIDE) capsule 12.5 mg (12.5 mg Oral Given 02/17/16 0933)  lactated ringers infusion ( Intravenous Anesthesia Volume Adjustment 02/16/16 1551)  0.9 %  sodium chloride infusion (not administered)  magnesium sulfate IVPB 2 g 50 mL (not administered)  potassium chloride SA (K-DUR,KLOR-CON) CR tablet 20-40 mEq (not administered)  docusate sodium (COLACE) capsule 100 mg (100 mg Oral Given 02/17/16 0933)  ondansetron (ZOFRAN) injection 4 mg (not administered)  alum & mag hydroxide-simeth (MAALOX/MYLANTA) 200-200-20 MG/5ML suspension 15-30 mL (not administered)  pantoprazole (PROTONIX) EC tablet 40 mg (40 mg Oral Given 02/17/16 0933)  labetalol (NORMODYNE,TRANDATE)  injection 10 mg (not administered)  metoprolol (LOPRESSOR) injection 2-5 mg (not administered)  guaiFENesin-dextromethorphan (ROBITUSSIN DM) 100-10 MG/5ML syrup 15 mL (not administered)  phenol (CHLORASEPTIC) mouth spray 1 spray (not administered)  0.9 %  sodium chloride infusion (0 mLs Intravenous Stopped 02/17/16 1233)  morphine 2 MG/ML injection 2-5 mg (not administered)  senna-docusate (Senokot-S) tablet 1 tablet (not administered)  bisacodyl (DULCOLAX) EC tablet 5 mg (not administered)  enoxaparin (LOVENOX) injection 85 mg (85 mg Subcutaneous Given 02/17/16 1632)  Warfarin - Pharmacist Dosing Inpatient (not administered)  metoprolol (LOPRESSOR) injection 5 mg (5 mg Intravenous Given 02/13/16 1519)  labetalol (NORMODYNE,TRANDATE) injection 10 mg (10 mg Intravenous Given 02/13/16 1635)  labetalol (NORMODYNE,TRANDATE) injection 10 mg (10 mg Intravenous Given 02/13/16 1736)  iopamidol (ISOVUE-370) 76 % injection (100 mLs  Contrast Given 02/13/16 1748)  cloNIDine (CATAPRES) tablet 0.2 mg (0.2 mg Oral Given 02/13/16 2025)  heparin bolus via infusion 4,000 Units (4,000 Units Intravenous Bolus from Bag 02/13/16 2031)  dextrose 50 % solution 50 mL (50 mLs Intravenous Given 02/13/16 2224)  calcium gluconate 2 g in sodium chloride 0.9 % 100 mL IVPB (2 g Intravenous New Bag/Given 02/13/16 2225)  sodium polystyrene (KAYEXALATE) 15 GM/60ML suspension 30 g (30 g Oral Given 02/13/16 2225)  insulin aspart (novoLOG) injection 5 Units (5 Units Intravenous Given 02/13/16 2225)  sodium chloride 0.9 % bolus 1,000 mL (1,000 mLs Intravenous Given  02/13/16 2352)  sodium chloride 0.9 % bolus 500 mL (500 mLs Intravenous Given 02/14/16 0109)  potassium chloride (K-DUR,KLOR-CON) CR tablet 30 mEq (30 mEq Oral Given 02/15/16 0759)  cefUROXime (ZINACEF) 1.5 g in dextrose 5 % 50 mL IVPB ( Intravenous MAR Unhold 02/16/16 1649)  cefUROXime (ZINACEF) 1.5 g in dextrose 5 % 50 mL IVPB (1.5 g Intravenous Given 02/17/16 1210)  warfarin  (COUMADIN) tablet 7.5 mg (7.5 mg Oral Given 02/17/16 1747)  warfarin (COUMADIN) video (1 each Does not apply Given 02/17/16 1315)  coumadin book (1 each Does not apply Given 02/17/16 1315)     Initial Impression / Assessment and Plan / ED Course  I have reviewed the triage vital signs and the nursing notes.  Pertinent labs & imaging results that were available during my care of the patient were reviewed by me and considered in my medical decision making (see chart for details).  Clinical Course      Final Clinical Impressions(s) / ED Diagnoses   Final diagnoses:  Pain  Pain aggravated by walking  Uncontrolled hypertension  Peripheral vascular disease of extremity with claudication (HCC)  Bilateral leg pain    New Prescriptions Current Discharge Medication List       Nelva Nay, MD 02/18/16 [phone]

## 2016-02-13 NOTE — ED Triage Notes (Signed)
C/o bilateral leg pain worsen after walking to store  And back To buy 40 oz beer, states has been out htn meds and pain meds x 2 -3 months

## 2016-02-14 ENCOUNTER — Inpatient Hospital Stay (HOSPITAL_COMMUNITY): Payer: Self-pay

## 2016-02-14 ENCOUNTER — Encounter (HOSPITAL_COMMUNITY): Payer: Self-pay | Admitting: General Practice

## 2016-02-14 DIAGNOSIS — M79605 Pain in left leg: Secondary | ICD-10-CM

## 2016-02-14 DIAGNOSIS — Z01810 Encounter for preprocedural cardiovascular examination: Secondary | ICD-10-CM

## 2016-02-14 DIAGNOSIS — M79604 Pain in right leg: Secondary | ICD-10-CM

## 2016-02-14 LAB — COMPREHENSIVE METABOLIC PANEL
ALT: 19 U/L (ref 17–63)
AST: 16 U/L (ref 15–41)
Albumin: 3.1 g/dL — ABNORMAL LOW (ref 3.5–5.0)
Alkaline Phosphatase: 69 U/L (ref 38–126)
Anion gap: 9 (ref 5–15)
BUN: 9 mg/dL (ref 6–20)
CO2: 25 mmol/L (ref 22–32)
Calcium: 8.8 mg/dL — ABNORMAL LOW (ref 8.9–10.3)
Chloride: 106 mmol/L (ref 101–111)
Creatinine, Ser: 0.95 mg/dL (ref 0.61–1.24)
GFR calc Af Amer: >60 mL/min (ref >60)
GFR calc non Af Amer: >60 mL/min (ref >60)
Glucose, Bld: 96 mg/dL (ref 65–99)
Potassium: 4.1 mmol/L (ref 3.5–5.1)
Sodium: 140 mmol/L (ref 135–145)
Total Bilirubin: 1.5 mg/dL — ABNORMAL HIGH (ref 0.3–1.2)
Total Protein: 6.1 g/dL — ABNORMAL LOW (ref 6.5–8.1)

## 2016-02-14 LAB — LIPID PANEL
Cholesterol: 159 mg/dL (ref 0–200)
HDL: 45 mg/dL (ref >40)
LDL Cholesterol: 99 mg/dL (ref 0–99)
Total CHOL/HDL Ratio: 3.5 RATIO
Triglycerides: 73 mg/dL (ref <150)
VLDL: 15 mg/dL (ref 0–40)

## 2016-02-14 LAB — CBC
HEMATOCRIT: 40.4 % (ref 39.0–52.0)
Hemoglobin: 13.2 g/dL (ref 13.0–17.0)
MCH: 31.4 pg (ref 26.0–34.0)
MCHC: 32.7 g/dL (ref 30.0–36.0)
MCV: 96.2 fL (ref 78.0–100.0)
PLATELETS: 330 10*3/uL (ref 150–400)
RBC: 4.2 MIL/uL — ABNORMAL LOW (ref 4.22–5.81)
RDW: 12.9 % (ref 11.5–15.5)
WBC: 8.2 10*3/uL (ref 4.0–10.5)

## 2016-02-14 LAB — PROTIME-INR
INR: 1.1
Prothrombin Time: 14.2 seconds (ref 11.4–15.2)

## 2016-02-14 LAB — HEPARIN LEVEL (UNFRACTIONATED)
HEPARIN UNFRACTIONATED: 0.85 [IU]/mL — AB (ref 0.30–0.70)
Heparin Unfractionated: 0.47 IU/mL (ref 0.30–0.70)

## 2016-02-14 LAB — APTT: aPTT: 123 seconds — ABNORMAL HIGH (ref 24–36)

## 2016-02-14 LAB — LACTIC ACID, PLASMA
LACTIC ACID, VENOUS: 1 mmol/L (ref 0.5–1.9)
LACTIC ACID, VENOUS: 2.5 mmol/L — AB (ref 0.5–1.9)

## 2016-02-14 MED ORDER — ATORVASTATIN CALCIUM 40 MG PO TABS
40.0000 mg | ORAL_TABLET | Freq: Every day | ORAL | Status: DC
Start: 2016-02-14 — End: 2016-02-19
  Administered 2016-02-14 – 2016-02-18 (×6): 40 mg via ORAL
  Filled 2016-02-14 (×6): qty 1

## 2016-02-14 MED ORDER — ASPIRIN 325 MG PO TABS
325.0000 mg | ORAL_TABLET | Freq: Every day | ORAL | Status: DC
  Administered 2016-02-14 – 2016-02-19 (×7): 325 mg via ORAL
  Filled 2016-02-14 (×8): qty 1

## 2016-02-14 MED ORDER — SODIUM CHLORIDE 0.9 % IV BOLUS (SEPSIS)
500.0000 mL | Freq: Once | INTRAVENOUS | Status: AC
  Administered 2016-02-14: 500 mL via INTRAVENOUS

## 2016-02-14 NOTE — Progress Notes (Signed)
  Echocardiogram 2D Echocardiogram has been performed.  Leta JunglingCooper, Sheryle Vice M 02/14/2016, 4:31 PM

## 2016-02-14 NOTE — Progress Notes (Signed)
PT Cancellation Note  Patient Details Name: Stephen Bowen MRN: [id number] DOB: September 21, 1955   Cancelled Treatment:    Reason Eval/Treat Not Completed: PT screened, no needs identified, will sign off. Per OT pt mobilizing without difficulty and no PT needs.   [name] 02/14/2016, 11:10 AM [name] PT [phone]

## 2016-02-14 NOTE — Progress Notes (Signed)
CRITICAL VALUE ALERT  Critical value received:  Lactic Acid 2.5  Date of notification:  02/14/16  Time of notification:  12:45AM  Critical value read back: yes  Nurse who received alert:  Jinny Sandershohina Gedalia Mcmillon, RN  MD notified (1st page):  Craige CottaKirby, NP  Time of first page: 12:55 AM  MD notified (2nd page):  Time of second page:  Responding MD:  Craige CottaKirby, NP  Time MD responded:  1:00 AM

## 2016-02-14 NOTE — Progress Notes (Signed)
PROGRESS NOTE                                                                                                                                                                                                             Patient Demographics:    Stephen Bowen, is a 60 y.o. male, DOB - 09-Feb-1956, ZOX:[id number]  Admit date - 02/13/2016   Admitting Physician Lorretta Harp, MD  Outpatient Primary MD for the patient is PROVIDER NOT IN SYSTEM  LOS - 1  Chief Complaint  Patient presents with  . Leg Pain       Brief Narrative  60 y.o. male with medical history significant of PAD with claudication, hypertension, tobacco abuse, medication noncompliance, who presents with bilateral leg pain,Seen by vascular surgery, started on heparin drip, plan for aortogram with possible intervention tomorrow.   Subjective:    [name] today has, No headache, No chest pain, No abdominal pain , Currently denies any lower extremity pain.  Assessment  & Plan :    Principal Problem:   PAD (peripheral artery disease) (HCC) Active Problems:   Hypertensive urgency   Atherosclerosis of native arteries of extremity with intermittent claudication (HCC)   Tobacco use disorder   Leg pain   Hyperkalemia   Lactic acid increased   PAD - pt has advanced peripheral vascular disease with bilateral occlusions that appear compensated.  - Management per vascular surgery , discussed with Dr. Randie Heinz, plan for aortogram yesterday with possible intervention , continue with heparin GTT . - We'll start on aspirin and statin medications.  Lactic acid increased - most likely due to limb ischemia. Lactic acid level normalized - treat limb ischemia as above - Continue with IV fluids  Hyperkalemia: -  Potassium 5.8 with T-wave peaking in V3-V4 on EKG on admission. - treated with D50, 5 unit of Novolog by IV, 2g of calcium chloride, 30 g of Kayexalate - Resolved  Hypertensive  urgency: - Blood pressure significantly elevated on admission  at 226/145, which improved to 138/98 after treated with 1 dose of clonidine 0.2 mg, metoprolol 5 mg and labetalol 10 mg x 2 in the ED.  - hold Lisinopril-HCTZ due to hyperkalemia and the need of IV fluid - resume Verapamil 120 mg daily - IV hydralazine when necessary  Tobacco abuse: -Counseled -Nicotine patch  Code Status : Full  Family Communication  : None at ebdside  Disposition Plan  : pending further work up.  Consults  :  Vascular surgery  Procedures  : none  DVT Prophylaxis  :  Heparin GTT  Lab Results  Component Value Date   PLT 330 02/14/2016    Antibiotics  :    Anti-infectives    None        Objective:   Vitals:   02/13/16 2100 02/13/16 2200 02/13/16 2300 02/14/16 0100  BP: (!) 175/113 128/86 135/91   Pulse: 98 88 86   Resp: 15 16 16    Temp:   98 F (36.7 C)   TempSrc:   Oral   SpO2: 94% 96% 92%   Weight:    57 kg (125 lb 9.6 oz)  Height:   5\' 8"  (1.727 m)     Wt Readings from Last 3 Encounters:  02/14/16 57 kg (125 lb 9.6 oz)  08/31/15 61.6 kg (135 lb 12.8 oz)  08/16/15 58.5 kg (129 lb)     Intake/Output Summary (Last 24 hours) at 02/14/16 1111 Last data filed at 02/14/16 0300  Gross per 24 hour  Intake          1139.83 ml  Output                0 ml  Net          1139.83 ml     Physical Exam  Awake Alert, Oriented X 3,  Supple Neck,No JVD,  Symmetrical Chest wall movement, Good air movement bilaterally, CTAB RRR,No Gallops,Rubs or new Murmurs, No Parasternal Heave +ve B.Sounds, Abd Soft, No tenderness,  No rebound - guarding or rigidity. No cellulitis, gangrene or ischemic wound, significantly diminished pulses in the right lower extremity, absent in distal left lower extremity    Data Review:    CBC  Recent Labs Lab 02/13/16 1519 02/13/16 1621 02/14/16 0321  WBC 9.6  --  8.2  HGB 16.5 17.7* 13.2  HCT 50.3 52.0 40.4  PLT 334  --  330  MCV 96.4   --  96.2  MCH 31.6  --  31.4  MCHC 32.8  --  32.7  RDW 13.0  --  12.9  LYMPHSABS 1.1  --   --   MONOABS 0.6  --   --   EOSABS 0.0  --   --   BASOSABS 0.0  --   --     Chemistries   Recent Labs Lab 02/13/16 1610 02/13/16 1621 02/14/16 0321  NA 137 133* 140  K 5.1 5.8* 4.1  CL 99* 102 106  CO2 24  --  25  GLUCOSE 79 70 96  BUN 9 15 9   CREATININE 0.92 0.90 0.95  CALCIUM 9.1  --  8.8*  AST  --   --  16  ALT  --   --  19  ALKPHOS  --   --  69  BILITOT  --   --  1.5*   ------------------------------------------------------------------------------------------------------------------  Recent Labs  02/14/16 0321  CHOL 159  HDL 45  LDLCALC 99  TRIG 73  CHOLHDL 3.5    Lab Results  Component Value Date   HGBA1C 5.5 11/13/2013   ------------------------------------------------------------------------------------------------------------------ No results for input(s): TSH, T4TOTAL, T3FREE, THYROIDAB in the last 72 hours.  Invalid input(s): FREET3 ------------------------------------------------------------------------------------------------------------------ No results for input(s): VITAMINB12, FOLATE, FERRITIN, TIBC, IRON, RETICCTPCT in the last 72 hours.  Coagulation profile  Recent Labs Lab 02/13/16 0010  INR 1.10    No results for input(s): DDIMER in the last 72 hours.  Cardiac Enzymes No results for input(s): CKMB, TROPONINI, MYOGLOBIN in the last 168 hours.  Invalid input(s): CK ------------------------------------------------------------------------------------------------------------------ No results found for: BNP  Inpatient Medications  Scheduled Meds: . aspirin  325 mg Oral Daily  . atorvastatin  40 mg Oral q1800  . nicotine  21 mg Transdermal Q24H  . sodium chloride flush  3 mL Intravenous Q12H  . verapamil  120 mg Oral Daily   Continuous Infusions: . sodium chloride 100 mL/hr at 02/14/16 0548  . heparin 900 Units/hr (02/14/16 0340)    PRN Meds:.acetaminophen **OR** acetaminophen, hydrALAZINE, HYDROcodone-acetaminophen  Micro Results No results found for this or any previous visit (from the past 240 hour(s)).  Radiology Reports Ct Angio Ao+bifem W &/or Wo Contrast  Result Date: 02/13/2016 CLINICAL DATA:  Pain aggravated by walking. No rest pain and intact pulses per report. EXAM: CT ANGIOGRAPHY AOBIFEM WITHOUT AND WITH CONTRAST TECHNIQUE: CTA of the abdominal aorta bilateral lower extremities was performed in the arterial phase. Multiplanar reformats were generated. CONTRAST:  100 cc Isovue 370 intravenous COMPARISON:  None. FINDINGS: Vascular: Aorta: Standard aortic branching. Diffuse atheromatous wall thickening and calcification. No aneurysm or dissection. Visceral branches: No major branch occlusion. The celiac and SMA are without flow limiting stenosis. Moderate right renal ostial stenosis. Patent IMA. Right Leg: Iliacs: Diffuse atheromatous changes. No common or external iliac flow limiting stenosis. Most branches of the hypogastric artery are occluded. Negative for aneurysm. Common femoral: Heavily diseased with atheromatous plaque. No flow limiting stenosis. Superficial femoral artery: Moderate narrowing just beyond the common femoral bifurcation. Occlusion near at the hiatus with well-formed muscular collaterals proximal to the occlusion. Profunda femoris artery: Heavily diseased and stenotic proximally with first order branch occlusion. The remaining arterial bed is occluded after muscular branches into the medial compartment. Popliteal artery:  Occluded throughout its extent. Below the knee: Collateral flow reconstitutes the anterior tibial artery. The tibioperoneal trunk, tibial, and peroneal arteries are thready proximally but are ramified distally and there is essentially three-vessel runoff that is symmetrically timed to the left. Left leg: Iliacs: Approximately 50% stenosis of the common iliac artery by low-density  plaque. Multiple branches of the hypogastric artery are occluded. Common femoral: Heavily diseased with atheromatous plaque. No flow limiting stenosis. Superficial femoral: Occluded from its origin to near the hiatus where there are well-formed collaterals. Profunda femoris: Moderate to advanced stenosis at the origin with diffuse atheromatous narrowing and luminal irregularity, but patent serving multiple muscular branches in the thigh. Popliteal artery: Reconstituted below the hiatus by well-formed collaterals. Heavily diseased throughout its course with multi focal high-grade stenosis. At the level of the knee there is bulky calcified plaque. Short segment occlusion below the knee. Below the knee: No flow until collaterals weakly reconstitute the anterior tibial artery which is thready at the ankle. Collateral also reconstitutes the posterior tibial which is thready at the ankle. No peroneal reconstitution. Nonvascular: Lower chest and abdominal wall:  No contributory findings. Hepatobiliary: No focal liver abnormality.No evidence of biliary obstruction or stone. Pancreas: Unremarkable. Spleen: Hypervascular mass in the medial spleen measuring 9 mm, nonspecific but often incidental hemangioma. Adrenals/Urinary Tract: Negative adrenals. Symmetric renal enhancement. No hydronephrosis. Excretion of contrast limits detection of urolithiasis. Unremarkable bladder. Stomach/Bowel:  No obstruction. No inflammation. Reproductive:No pathologic findings. Vascular/Lymphatic: No acute vascular abnormality. No mass or adenopathy. Other: No ascites or pneumoperitoneum. Musculoskeletal: No acute abnormalities. Remote left tibia fracture status post ORIF  with intra medullary nail. Remote and healed fibular diaphysis fracture on the left. These results were called by telephone at the time of interpretation on 02/13/2016 at ~7:10 pm to Dr. Nelva NayOBERT BEATON , who verbally acknowledged these results. Review of the MIP images confirms the  above findings. IMPRESSION: 1. Advanced peripheral vascular disease with bilateral occlusions that appear compensated. 2. Right SFA occlusion near the adductor hiatus with infrapopliteal reconstitution and three-vessel runoff. Heavily diseased profunda femoris with early branch occlusions. 3. Early left SFA occlusion with thready discontinuous popliteal and below the knee reconstitution. Single vessel/posterior tibial runoff at the ankle. 4. No flow limiting aortic or iliac stenosis. 5. Moderate right renal ostial stenosis. Electronically Signed   By: Marnee SpringJonathon  Watts M.D.   On: 02/13/2016 19:21     Nikayla Madaris M.D on 02/14/2016 at 11:11 AM  Between 7am to 7pm - Pager - [phone]  After 7pm go to [email or web address] - password [email or web address]  [facility] -  Office  [phone]

## 2016-02-14 NOTE — Progress Notes (Signed)
ANTICOAGULATION CONSULT NOTE - Follow Up Consult  Pharmacy Consult for Heparin  Indication: arterial occlusion  No Known Allergies  Patient Measurements: Height: 5\' 8"  (172.7 cm) Weight: 125 lb 9.6 oz (57 kg) IBW/kg (Calculated) : 68.4  Vital Signs:    Labs:  Recent Labs  02/13/16 0010  02/13/16 1519 02/13/16 1610 02/13/16 1621 02/14/16 0257 02/14/16 0321 02/14/16 1226  HGB  --   < > 16.5  --  17.7*  --  13.2  --   HCT  --   --  50.3  --  52.0  --  40.4  --   PLT  --   --  334  --   --   --  330  --   APTT 123*  --   --   --   --   --   --   --   LABPROT 14.2  --   --   --   --   --   --   --   INR 1.10  --   --   --   --   --   --   --   HEPARINUNFRC  --   --   --   --   --  0.85*  --  0.47  CREATININE  --   --   --  0.92 0.90  --  0.95  --   < > = values in this interval not displayed.  Estimated Creatinine Clearance: 66.7 mL/min (by C-G formula based on SCr of 0.95 mg/dL).   Assessment: 60 y/o M with arterial occlusion, possible endovascular intervention on Wednesday, on heparin drip for now, initial heparin level is slightly high, no issues per RN.   Repeat HL is now therapeutic at 0.47 on heparin 900 units/hr. No issues with infusion or bleeding noted.   Goal of Therapy:  Heparin level 0.3-0.7 units/ml Monitor platelets by anticoagulation protocol: Yes   Plan:  Continue heparin 900 units/hr Daily HL Monitor s/sx of bleeding  Arlean Hoppingorey M. Newman PiesBall, PharmD, BCPS Clinical Pharmacist Pager [phone] 02/14/2016,1:44 PM

## 2016-02-14 NOTE — Progress Notes (Signed)
ANTICOAGULATION CONSULT NOTE - Follow Up Consult  Pharmacy Consult for Heparin  Indication: arterial occlusion  No Known Allergies  Patient Measurements: Height: 5\' 8"  (172.7 cm) Weight: 125 lb 9.6 oz (57 kg) IBW/kg (Calculated) : 68.4  Vital Signs: Temp: 98 F (36.7 C) (08/21 2300) Temp Source: Oral (08/21 2300) BP: 135/91 (08/21 2300) Pulse Rate: 86 (08/21 2300)  Labs:  Recent Labs  02/13/16 0010 02/13/16 1519 02/13/16 1610 02/13/16 1621 02/14/16 0257  HGB  --  16.5  --  17.7*  --   HCT  --  50.3  --  52.0  --   PLT  --  334  --   --   --   APTT 123*  --   --   --   --   LABPROT 14.2  --   --   --   --   INR 1.10  --   --   --   --   HEPARINUNFRC  --   --   --   --  0.85*  CREATININE  --   --  0.92 0.90  --     Estimated Creatinine Clearance: 70.4 mL/min (by C-G formula based on SCr of 0.9 mg/dL).   Assessment: [age] y/o M with arterial occlusion, possible endovascular intervention on Wednesday, on heparin drip for now, initial heparin level is slightly high, no issues per RN.   Goal of Therapy:  Heparin level 0.3-0.7 units/ml Monitor platelets by anticoagulation protocol: Yes   Plan:  -Decrease heparin to 900 units/hr -1200 HL  Travelle Mcclimans 02/14/2016,3:32 AM

## 2016-02-14 NOTE — Progress Notes (Signed)
  Progress Note    02/14/2016 1:07 PM * No surgery date entered *  Subjective: left leg pain improved  Vitals:   02/13/16 2200 02/13/16 2300  BP: 128/86 135/91  Pulse: 88 86  Resp: 16 16  Temp:  98 F (36.7 C)    Physical Exam: rrr Non labored breathing Palpable femoral pulses bilaterally Monophasic pt on left  CBC    Component Value Date/Time   WBC 8.2 02/14/2016 0321   RBC 4.20 (L) 02/14/2016 0321   HGB 13.2 02/14/2016 0321   HCT 40.4 02/14/2016 0321   PLT 330 02/14/2016 0321   MCV 96.2 02/14/2016 0321   MCH 31.4 02/14/2016 0321   MCHC 32.7 02/14/2016 0321   RDW 12.9 02/14/2016 0321   LYMPHSABS 1.1 02/13/2016 1519   MONOABS 0.6 02/13/2016 1519   EOSABS 0.0 02/13/2016 1519   BASOSABS 0.0 02/13/2016 1519    BMET    Component Value Date/Time   NA 140 02/14/2016 0321   K 4.1 02/14/2016 0321   CL 106 02/14/2016 0321   CO2 25 02/14/2016 0321   GLUCOSE 96 02/14/2016 0321   BUN 9 02/14/2016 0321   CREATININE 0.95 02/14/2016 0321   CREATININE 0.92 11/13/2013 1031   CALCIUM 8.8 (L) 02/14/2016 0321   GFRNONAA >60 02/14/2016 0321   GFRNONAA >89 11/13/2013 1031   GFRAA >60 02/14/2016 0321   GFRAA >89 11/13/2013 1031    INR    Component Value Date/Time   INR 1.10 02/13/2016 0010     Intake/Output Summary (Last 24 hours) at 02/14/16 1307 Last data filed at 02/14/16 0300  Gross per 24 hour  Intake          1139.83 ml  Output                0 ml  Net          1139.83 ml     Assessment/Plan:  60 y.o. male admitted with new onset LLE pain. Will get ABI and echo today and plan for aortogram with lle runoff and possible intervention tomorrow.   Bern Fare C. Randie Heinzain, MD [facility] Office: [phone] Pager: [phone]  02/14/2016 1:07 PM

## 2016-02-14 NOTE — Progress Notes (Signed)
VASCULAR LAB PRELIMINARY  ARTERIAL  ABI completed:    RIGHT    LEFT    PRESSURE WAVEFORM  PRESSURE WAVEFORM  BRACHIAL 201 Triphasic BRACHIAL 196 Triphasic  DP 95  Severely dampened monophasic DP  Unable to identify discernable waveform  AT   AT    PT 67  Severely dampened monophasic PT 52  Severely dampened monophasic  PER   PER    GREAT TOE Unable to identify discernable waveform NA GREAT TOE Unable to identify discernable waveform NA    RIGHT LEFT  ABI 0.47 0.26   The right ABI is suggestive of severe arterial insufficiency at rest. The left ABI is suggestive of critical arterial insufficiency at rest.  02/14/2016 5:06 PM Gertie FeyMichelle Jennetta Flood, BS, RVT, RDCS, RDMS

## 2016-02-14 NOTE — Evaluation (Signed)
Occupational Therapy Evaluation Patient Details Name: Stephen Bowen Bown MRN: [id number] DOB: 10-03-1955 Today's Date: 02/14/2016    History of Present Illness Stephen Bowen Stephen Bowen is a 60 y.o. male with medical history significant of PAD with claudication, hypertension, tobacco abuse, medication noncompliance, who presents with bilateral leg pain.   Clinical Impression   This 60 yo male admitted with above presents to acute OT at a Mod I to Independent level for all basic ADLs and mobility (have made PT aware). We will sign off from acute OT standpoint without need for followup.    Follow Up Recommendations  No OT follow up    Equipment Recommendations  None recommended by OT       Precautions / Restrictions Precautions Precautions: None Restrictions Weight Bearing Restrictions: No      Mobility Bed Mobility Overal bed mobility: Independent                Transfers Overall transfer level: Independent                    Balance Overall balance assessment: Modified Independent                                          ADL Overall ADL's : Independent                                                       Pertinent Vitals/Pain Pain Assessment: 0-10 Pain Score: 5  Pain Location: left in thigh with ambulation Pain Descriptors / Indicators: Sore;Cramping Pain Intervention(s): Monitored during session;Repositioned (made RN aware)     Hand Dominance Right   Extremity/Trunk Assessment Upper Extremity Assessment Upper Extremity Assessment: Overall WFL for tasks assessed   Lower Extremity Assessment Lower Extremity Assessment: Overall WFL for tasks assessed       Communication Communication Communication: No difficulties   Cognition Arousal/Alertness: Awake/alert Behavior During Therapy: WFL for tasks assessed/performed Overall Cognitive Status: Within Functional Limits for tasks assessed                                Home Living Family/patient expects to be discharged to:: Private residence Living Arrangements: Other relatives Available Help at Discharge: Family (uncle who can provide S) Type of Home: House Home Access: Stairs to enter Entergy CorporationEntrance Stairs-Number of Steps: 1 Entrance Stairs-Rails: None Home Layout: One level     Bathroom Shower/Tub: Tub/shower unit;Curtain Shower/tub characteristics: Engineer, building servicesCurtain Bathroom Toilet: Standard     Home Equipment: Environmental consultantWalker - 2 wheels;Cane - single point;Shower seat          Prior Functioning/Environment Level of Independence: Independent             OT Diagnosis: Generalized weakness;Acute pain         OT Goals(Current goals can be found in the care plan section) Acute Rehab OT Goals Patient Stated Goal: to get pain better  OT Frequency:                End of Session Equipment Utilized During Treatment: Gait belt Nurse Communication:  (asked about need for chair alarm and RN said he felt like pt did not need one)  Activity Tolerance: Patient tolerated treatment well Patient left: in chair;with call bell/phone within reach   Time: [id number] OT Time Calculation (min): 19 min Charges:  OT General Charges $OT Visit: 1 Procedure OT Evaluation $OT Eval Moderate Complexity: 1 Procedure  Stephen Bowen, Stephen Bowen Eva [phone] 02/14/2016, 10:45 AM

## 2016-02-15 ENCOUNTER — Encounter (HOSPITAL_COMMUNITY)
Admission: EM | Disposition: A | Discharge status: Home / Self Care | Payer: Self-pay | Source: Home / Self Care | Attending: Internal Medicine

## 2016-02-15 ENCOUNTER — Encounter (HOSPITAL_COMMUNITY): Payer: Self-pay | Admitting: Physician Assistant

## 2016-02-15 ENCOUNTER — Inpatient Hospital Stay (HOSPITAL_COMMUNITY): Payer: Self-pay

## 2016-02-15 DIAGNOSIS — I70219 Atherosclerosis of native arteries of extremities with intermittent claudication, unspecified extremity: Secondary | ICD-10-CM

## 2016-02-15 DIAGNOSIS — Z01810 Encounter for preprocedural cardiovascular examination: Secondary | ICD-10-CM

## 2016-02-15 DIAGNOSIS — I739 Peripheral vascular disease, unspecified: Secondary | ICD-10-CM

## 2016-02-15 HISTORY — PX: PERIPHERAL VASCULAR CATHETERIZATION: SHX172C

## 2016-02-15 LAB — BASIC METABOLIC PANEL
ANION GAP: 6 (ref 5–15)
BUN: <5 mg/dL — ABNORMAL LOW (ref 6–20)
CALCIUM: 8 mg/dL — AB (ref 8.9–10.3)
CHLORIDE: 103 mmol/L (ref 101–111)
CO2: 25 mmol/L (ref 22–32)
Creatinine, Ser: 0.83 mg/dL (ref 0.61–1.24)
GFR calc Af Amer: >60 mL/min (ref >60)
GFR calc non Af Amer: >60 mL/min (ref >60)
GLUCOSE: 91 mg/dL (ref 65–99)
POTASSIUM: 3.4 mmol/L — AB (ref 3.5–5.1)
Sodium: 134 mmol/L — ABNORMAL LOW (ref 135–145)

## 2016-02-15 LAB — CBC
HEMATOCRIT: 39.2 % (ref 39.0–52.0)
HEMOGLOBIN: 12.6 g/dL — AB (ref 13.0–17.0)
MCH: 31 pg (ref 26.0–34.0)
MCHC: 32.1 g/dL (ref 30.0–36.0)
MCV: 96.3 fL (ref 78.0–100.0)
Platelets: 279 10*3/uL (ref 150–400)
RBC: 4.07 MIL/uL — ABNORMAL LOW (ref 4.22–5.81)
RDW: 12.9 % (ref 11.5–15.5)
WBC: 7.9 10*3/uL (ref 4.0–10.5)

## 2016-02-15 LAB — HEMOGLOBIN A1C
Hgb A1c MFr Bld: 5.5 % (ref 4.8–5.6)
MEAN PLASMA GLUCOSE: 111 mg/dL

## 2016-02-15 LAB — SURGICAL PCR SCREEN
MRSA, PCR: NEGATIVE
Staphylococcus aureus: NEGATIVE

## 2016-02-15 LAB — HEPARIN LEVEL (UNFRACTIONATED): Heparin Unfractionated: 0.45 IU/mL (ref 0.30–0.70)

## 2016-02-15 SURGERY — ABDOMINAL AORTOGRAM W/LOWER EXTREMITY

## 2016-02-15 MED ORDER — LISINOPRIL-HYDROCHLOROTHIAZIDE 10-12.5 MG PO TABS: 1.0000 | ORAL_TABLET | Freq: Every day | ORAL | Status: DC

## 2016-02-15 MED ORDER — DEXTROSE 5 % IV SOLN
1.5000 g | INTRAVENOUS | Status: AC
  Administered 2016-02-16: 1.5 g via INTRAVENOUS
  Filled 2016-02-15 (×2): qty 1.5

## 2016-02-15 MED ORDER — LISINOPRIL 10 MG PO TABS
10.0000 mg | ORAL_TABLET | Freq: Every day | ORAL | Status: DC
  Administered 2016-02-15 – 2016-02-19 (×5): 10 mg via ORAL
  Filled 2016-02-15 (×5): qty 1

## 2016-02-15 MED ORDER — HEPARIN (PORCINE) IN NACL 2-0.9 UNIT/ML-% IJ SOLN
INTRAMUSCULAR | Status: AC
  Filled 2016-02-15: qty 1000

## 2016-02-15 MED ORDER — LABETALOL HCL 5 MG/ML IV SOLN
INTRAVENOUS | Status: AC
  Filled 2016-02-15: qty 8

## 2016-02-15 MED ORDER — IODIXANOL 320 MG/ML IV SOLN
INTRAVENOUS | Status: DC | PRN
  Administered 2016-02-15: 90 mL via INTRA_ARTERIAL

## 2016-02-15 MED ORDER — MORPHINE SULFATE (PF) 2 MG/ML IV SOLN: 2.0000 mg | INTRAVENOUS | Status: DC | PRN

## 2016-02-15 MED ORDER — HYDROCHLOROTHIAZIDE 12.5 MG PO CAPS
12.5000 mg | ORAL_CAPSULE | Freq: Every day | ORAL | Status: DC
  Administered 2016-02-15 – 2016-02-19 (×4): 12.5 mg via ORAL
  Filled 2016-02-15 (×5): qty 1

## 2016-02-15 MED ORDER — FENTANYL CITRATE (PF) 100 MCG/2ML IJ SOLN
INTRAMUSCULAR | Status: DC | PRN
  Administered 2016-02-15: 50 ug via INTRAVENOUS

## 2016-02-15 MED ORDER — MIDAZOLAM HCL 2 MG/2ML IJ SOLN
INTRAMUSCULAR | Status: DC | PRN
  Administered 2016-02-15: 1 mg via INTRAVENOUS

## 2016-02-15 MED ORDER — LIDOCAINE HCL (PF) 1 % IJ SOLN
INTRAMUSCULAR | Status: AC
  Filled 2016-02-15: qty 30

## 2016-02-15 MED ORDER — LIDOCAINE HCL (PF) 1 % IJ SOLN
INTRAMUSCULAR | Status: DC | PRN
  Administered 2016-02-15: 14 mL

## 2016-02-15 MED ORDER — LABETALOL HCL 5 MG/ML IV SOLN
INTRAVENOUS | Status: DC | PRN
  Administered 2016-02-15 (×2): 20 mg via INTRAVENOUS

## 2016-02-15 MED ORDER — FENTANYL CITRATE (PF) 100 MCG/2ML IJ SOLN
INTRAMUSCULAR | Status: AC
  Filled 2016-02-15: qty 2

## 2016-02-15 MED ORDER — HEPARIN (PORCINE) IN NACL 2-0.9 UNIT/ML-% IJ SOLN
INTRAMUSCULAR | Status: DC | PRN
  Administered 2016-02-15: 1000 mL via INTRA_ARTERIAL

## 2016-02-15 MED ORDER — POTASSIUM CHLORIDE CRYS ER 10 MEQ PO TBCR
30.0000 meq | EXTENDED_RELEASE_TABLET | Freq: Once | ORAL | Status: AC
  Administered 2016-02-15: 30 meq via ORAL
  Filled 2016-02-15: qty 1

## 2016-02-15 MED ORDER — MIDAZOLAM HCL 2 MG/2ML IJ SOLN
INTRAMUSCULAR | Status: AC
  Filled 2016-02-15: qty 2

## 2016-02-15 SURGICAL SUPPLY — 13 items
CATH OMNI FLUSH 5F 65CM (CATHETERS) ×2 IMPLANT
CATH STRAIGHT 5FR 65CM (CATHETERS) ×2 IMPLANT
COVER PRB 48X5XTLSCP FOLD TPE (BAG) ×1 IMPLANT
COVER PROBE 5X48 (BAG) ×1
DEVICE TORQUE .025-.038 (MISCELLANEOUS) ×2 IMPLANT
GUIDEWIRE ANGLED .035X150CM (WIRE) ×2 IMPLANT
KIT MICROINTRODUCER STIFF 5F (SHEATH) ×2 IMPLANT
KIT PV (KITS) ×2 IMPLANT
SHEATH PINNACLE 5F 10CM (SHEATH) ×2 IMPLANT
SYR MEDRAD MARK V 150ML (SYRINGE) ×2 IMPLANT
TRANSDUCER W/STOPCOCK (MISCELLANEOUS) ×2 IMPLANT
TRAY PV CATH (CUSTOM PROCEDURE TRAY) ×2 IMPLANT
WIRE BENTSON .035X145CM (WIRE) ×2 IMPLANT

## 2016-02-15 NOTE — Progress Notes (Signed)
Right  Upper Extremity Vein Map    Cephalic  Segment Diameter Depth Comment  1. Axilla 1.9073mm 4.537mm   2. 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 Above [facility]   Unable to visualize  4. In [facility]   Unable to visualize  5. Below AC   Unable to visualize  6. Mid forearm   Unable to visualize  7. Wrist   Unable to visualize                  Basilic  Segment Diameter Depth Comment  1. Axilla 2.4498mm 5.1159mm   2. Mid upper arm 2.5892mm 6.3045mm   3. Above [facility] 3.504mm 11.383mm   4. In [facility] 2.3437mm 1.4394mm   5. Below AC 3.671mm 4.2544mm Multiple branches  6. Mid forearm 2.7849mm 3.703mm   7. Wrist   Unable to visualize                   Left Upper Extremity Vein Map    Cephalic  Segment Diameter Depth Comment  1. Axilla 1.440mm 3.8507mm   2. Mid upper arm   Unable to visualize  3. Above [facility]   Unable to visualize  4. In [facility]C   Unable to visualize  5. Below AC   Unable to visualize  6. Mid forearm   Unable to visualize  7. Wrist   Unable to visualize                  Basilic  Segment Diameter Depth Comment  1. Axilla 3.4806mm 5.7812mm   2. Mid upper arm 2.8028mm 5.623mm   3. Above [facility] 1.8187mm 8.6772mm Branch  4. In [facility] 2.405mm 2.7119mm   5. Below AC 2.6783mm 2.4984mm Branch  6. Mid forearm   Unable to visualize  7. Wrist   Unable to visualize                   02/15/2016 6:02 PM Gertie FeyMichelle Jame Seelig, BS, RVT, RDCS, RDMS

## 2016-02-15 NOTE — Consult Note (Signed)
CARDIOLOGY CONSULT NOTE   Patient ID: Stephen Bowen MRN: [id number] DOB/AGE: 1955/08/05 60 y.o.  Admit date: 02/13/2016  Primary Physician   PROVIDER NOT IN SYSTEM Primary Cardiologist   New Reason for Consultation   preop eval Requesting MD: Dr Apolinar Junes  WUJ:[id number] Stephen Bowen is a 35 y.o. year old male with a history of PAD, claudication, HTN, ongoing tobacco use, med noncompliance.   Pt evaluated for LE pain>>Doppler 06/2015 w/ multivessel dz, rx medically. Pt out of BP rx x 2-3 months, still smoking.  08/22, pt came to the ER for worsening leg pain, seen by Dr Apolinar Junes, and angiogram performed, results below. He needs LE BPG, cards asked to see preop.   Stephen Bowen never gets chest pain. He continues to smoke (but less than 1 ppd) and drinks a tall-boy beer daily. He denies any CP or SOB with exertion. No palpitations or presyncope. Until his legs got bad, he was walking a mile each way to the store as needed. He struggles with this now, because the leg pain is so bad, L>R.      Past Medical History:  Diagnosis Date  . HTN (hypertension)   . PAD (peripheral artery disease) (HCC)      Past Surgical History:  Procedure Laterality Date  . PERIPHERAL VASCULAR CATHETERIZATION N/A 07/07/2015   Procedure: Abdominal Aortogram w/Lower Extremity;  Surgeon: Fransisco Hertz, MD;  Location: [facility] INVASIVE CV LAB;  Service: Cardiovascular;  Laterality: N/A;  . 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    No Known Allergies  I have reviewed the patient's current medications . [MAR Hold] aspirin  325 mg Oral Daily  . [MAR Hold] atorvastatin  40 mg Oral q1800  . [MAR Hold] lisinopril  10 mg Oral Daily   And  . [MAR Hold] hydrochlorothiazide  12.5 mg Oral Daily  . [MAR Hold] nicotine  21 mg Transdermal Q24H  . [MAR Hold] sodium chloride flush  3 mL Intravenous Q12H  . [MAR Hold] verapamil  120 mg Oral Daily   . sodium chloride 75 mL/hr at  02/14/16 1147  . heparin 900 Units/hr (02/14/16 1931)   [MAR Hold] acetaminophen **OR** [MAR Hold] acetaminophen, [MAR Hold] hydrALAZINE, [MAR Hold] HYDROcodone-acetaminophen  Prior to Admission medications   Medication Sig Start Date End Date Taking? Authorizing Provider  lisinopril-hydrochlorothiazide (ZESTORETIC) 10-12.5 MG tablet Take 1 tablet by mouth daily. 06/29/15  Yes Melton Krebs, PA-C  nicotine (NICODERM CQ - DOSED IN MG/24 HOURS) 21 mg/24hr patch Place 1 patch (21 mg total) onto the skin daily. 08/31/15  Yes Selina Cooley, MD  nicotine polacrilex (NICORETTE) 4 MG gum Take 1 each (4 mg total) by mouth as needed for smoking cessation. 08/31/15  Yes Selina Cooley, MD  selenium sulfide (SELSUN) 1 % LOTN Put on your face in the shower and wash off daily 08/31/15  Yes Selina Cooley, MD  verapamil (CALAN-SR) 120 MG CR tablet Take 1 tablet (120 mg total) by mouth daily. 08/31/15 08/30/16 Yes Selina Cooley, MD     Social History   Social History  . Marital status: Single    Spouse name: N/A  . Number of children: N/A  . Years of education: N/A   Occupational History  . Unemployed    Social History Main Topics  . Smoking status: Current Every Day Smoker    Packs/day: 0.50    Years: 40.00    Types: Cigarettes  .  Smokeless tobacco: Never Used     Comment: cutting back amount he is buying  . Alcohol use 8.4 oz/week    14 Cans of beer per week     Comment: Drinks a 40 oz "tallboy" daily  . Drug use: No  . Sexual activity: Yes    Birth control/ protection: Condom   Other Topics Concern  . Not on file   Social History Narrative   Smoking cigarettes since 60 y.o   Drinks beer 24 oz every other day        Family Status  Relation Status  . Mother Deceased  . Father Deceased  . Sister   . Brother    Family History  Problem Relation Age of Onset  . Cancer Mother     deceased in 4370s   . Heart attack Mother     diseased  . Heart disease Father   . Stroke Sister     age 8250s  .  Heart disease Brother     pacemaker in his 3350s     ROS:  Full 14 point review of systems complete and found to be negative unless listed above.  Physical Exam: Blood pressure (!) 149/87, pulse 80, temperature 97.7 F (36.5 C), temperature source Oral, resp. rate 18, height 5\' 8"  (1.727 m), weight 125 lb 9.6 oz (57 kg), SpO2 100 %.  General: Well developed, well nourished, male in no acute distress Head: Eyes PERRLA, No xanthomas.   Normocephalic and atraumatic, oropharynx without edema or exudate. Dentition: poor Lungs: decreased BS bases but clear Heart: HRRR S1 S2, no rub/gallop, no murmur. pulses are 2+ upper extrem.   Neck: No carotid bruits. No lymphadenopathy.  JVD not elevated Abdomen: Bowel sounds present, abdomen soft and non-tender without masses or hernias noted. Msk:  No spine or cva tenderness. No weakness, no joint deformities or effusions. Extremities: No clubbing or cyanosis. No edema.  Neuro: Alert and oriented X 3. No focal deficits noted. Psych:  Good affect, responds appropriately Skin: No rashes or lesions noted.  Labs:   Lab Results  Component Value Date   WBC 7.9 02/15/2016   HGB 12.6 (L) 02/15/2016   HCT 39.2 02/15/2016   MCV 96.3 02/15/2016   PLT 279 02/15/2016    Recent Labs  02/13/16 0010  INR 1.10     Recent Labs Lab 02/14/16 0321 02/15/16 0250  NA 140 134*  K 4.1 3.4*  CL 106 103  CO2 25 25  BUN 9 <5*  CREATININE 0.95 0.83  CALCIUM 8.8* 8.0*  PROT 6.1*  --   BILITOT 1.5*  --   ALKPHOS 69  --   ALT 19  --   AST 16  --   GLUCOSE 96 91  ALBUMIN 3.1*  --    Lab Results  Component Value Date   CHOL 159 02/14/2016   HDL 45 02/14/2016   LDLCALC 99 02/14/2016   TRIG 73 02/14/2016    Echo: 08/22 - Left ventricle: The cavity size was normal. There was moderate   concentric hypertrophy. Systolic function was normal. The   estimated ejection fraction was in the range of 55% to 60%. Wall   motion was normal; there were no regional  wall motion   abnormalities. Doppler parameters are consistent with abnormal   left ventricular relaxation (grade 1 diastolic dysfunction). The   E/e&' ratio is between 8-15, suggesting indeterminate LV filling   pressure. - Left atrium: The atrium was normal in size. - Inferior  vena cava: The vessel was dilated. The respirophasic   diameter changes were blunted (< 50%), consistent with elevated   central venous pressure. Impressions: - LVEF 55-60%, moderate LVH, normal wall motion, diastolic   dysfunction, indeterminate LV filling pressure, normal LA size,   dilated IVC.  ECG:  08/21 ST, HR 121, LVH, LA enlargement  PV Cath: 08/23 Findings:      Aortogram:  Stenosis of left common iliac artery                       Right Lower Extremity:  Occlusion of right SFA distal segment reconstitution of 18 after takeoff.                       Left Lower Extremity:  Occlusion of SFA at common femoral artery. Multiple stenoses of profunda branches. Reconstitution of distal SFA with occlusion of popliteal at the level of the knee. Reconstitution of PT artery after takeoff of tp trunk. Likely early takeoff of At.   Will need left femoral to TP trunk/PT artery bypass.  Radiology:  Ct Angio Ao+bifem W &/or Wo Contrast Result Date: 02/13/2016 CLINICAL DATA:  Pain aggravated by walking. No rest pain and intact pulses per report. EXAM: CT ANGIOGRAPHY AOBIFEM WITHOUT AND WITH CONTRAST TECHNIQUE: CTA of the abdominal aorta bilateral lower extremities was performed in the arterial phase. Multiplanar reformats were generated. CONTRAST:  100 cc Isovue 370 intravenous COMPARISON:  None. FINDINGS: Vascular: Aorta: Standard aortic branching. Diffuse atheromatous wall thickening and calcification. No aneurysm or dissection. Visceral branches: No major branch occlusion. The celiac and SMA are without flow limiting stenosis. Moderate right renal ostial stenosis. Patent IMA. Right Leg: Iliacs: Diffuse atheromatous  changes. No common or external iliac flow limiting stenosis. Most branches of the hypogastric artery are occluded. Negative for aneurysm. Common femoral: Heavily diseased with atheromatous plaque. No flow limiting stenosis. Superficial femoral artery: Moderate narrowing just beyond the common femoral bifurcation. Occlusion near at the hiatus with well-formed muscular collaterals proximal to the occlusion. Profunda femoris artery: Heavily diseased and stenotic proximally with first order branch occlusion. The remaining arterial bed is occluded after muscular branches into the medial compartment. Popliteal artery:  Occluded throughout its extent. Below the knee: Collateral flow reconstitutes the anterior tibial artery. The tibioperoneal trunk, tibial, and peroneal arteries are thready proximally but are ramified distally and there is essentially three-vessel runoff that is symmetrically timed to the left. Left leg: Iliacs: Approximately 50% stenosis of the common iliac artery by low-density plaque. Multiple branches of the hypogastric artery are occluded. Common femoral: Heavily diseased with atheromatous plaque. No flow limiting stenosis. Superficial femoral: Occluded from its origin to near the hiatus where there are well-formed collaterals. Profunda femoris: Moderate to advanced stenosis at the origin with diffuse atheromatous narrowing and luminal irregularity, but patent serving multiple muscular branches in the thigh. Popliteal artery: Reconstituted below the hiatus by well-formed collaterals. Heavily diseased throughout its course with multi focal high-grade stenosis. At the level of the knee there is bulky calcified plaque. Short segment occlusion below the knee. Below the knee: No flow until collaterals weakly reconstitute the anterior tibial artery which is thready at the ankle. Collateral also reconstitutes the posterior tibial which is thready at the ankle. No peroneal reconstitution. Nonvascular: Lower  chest and abdominal wall:  No contributory findings. Hepatobiliary: No focal liver abnormality.No evidence of biliary obstruction or stone. Pancreas: Unremarkable. Spleen: Hypervascular mass in the medial spleen measuring 9  mm, nonspecific but often incidental hemangioma. Adrenals/Urinary Tract: Negative adrenals. Symmetric renal enhancement. No hydronephrosis. Excretion of contrast limits detection of urolithiasis. Unremarkable bladder. Stomach/Bowel:  No obstruction. No inflammation. Reproductive:No pathologic findings. Vascular/Lymphatic: No acute vascular abnormality. No mass or adenopathy. Other: No ascites or pneumoperitoneum. Musculoskeletal: No acute abnormalities. Remote left tibia fracture status post ORIF with intra medullary nail. Remote and healed fibular diaphysis fracture on the left. These results were called by telephone at the time of interpretation on 02/13/2016 at ~7:10 pm to Dr. Nelva Nay , who verbally acknowledged these results. Review of the MIP images confirms the above findings. IMPRESSION: 1. Advanced peripheral vascular disease with bilateral occlusions that appear compensated. 2. Right SFA occlusion near the adductor hiatus with infrapopliteal reconstitution and three-vessel runoff. Heavily diseased profunda femoris with early branch occlusions. 3. Early left SFA occlusion with thready discontinuous popliteal and below the knee reconstitution. Single vessel/posterior tibial runoff at the ankle. 4. No flow limiting aortic or iliac stenosis. 5. Moderate right renal ostial stenosis. Electronically Signed   By: Marnee Spring M.Stephen.   On: 02/13/2016 19:21    ASSESSMENT AND PLAN:   The patient was seen today by Dr Herbie Baltimore, the patient evaluated and the data reviewed.   1. Preop evaluation:  - pt is without anginal symptoms, but activity is limited by claudication - has not walked up a flight of stairs in a long time. - never had an ischemic eval - EF nl by echo 08/22, no WMA  seen - As he had no limitations prior to onset of his claudication symptoms and he has a normal echo, no further cardiac workup is indicated. - He is at acceptable risk for the planned procedure from a cardiac standpoint. - continue to pursue BP control, would give verapamil am of procedure  2. Hyperkalemia - admission lactic acid level 2.5>>4.67, improved - K+ went from 5.1>>5.8 in 11 minutes, pt BP very high at that time - Kayexalate given with improvement  3. Hypokalemia - supplement already given  Otherwise, per IM/VVS Principal Problem:   PAD (peripheral artery disease) (HCC) Active Problems:   Hypertensive urgency   Atherosclerosis of native arteries of extremity with intermittent claudication (HCC)   Tobacco use disorder   Leg pain   Hyperkalemia   Lactic acid increased   Signed: Barrett, Rhonda, PA-C 02/15/2016 1:32 PM Beeper [phone]  Co-Sign MD   I have seen, examined and evaluated the patient this afternoon along with Theodore Demark, PA-c.  After reviewing all the available data and chart, we discussed the patients laboratory, study & physical findings as well as symptoms in detail. I agree with her findings, examination as well as impression recommendations as per our discussion.    Stephen Bowen is a very pleasant 22 year old gentleman with progressively worsening claudication from PAD. He is a long-term smoker, who otherwise only has a medical history notable for hypertension.  He has been followed for his chronic known PAD with CT scans, but has never had a cardiac evaluation. Are to his becoming limited by claudication, he had never had any symptoms at all of resting or exertional chest tightness or pressure to suggest angina. He is not had any heart symptoms of PND, orthopnea or edema. No sensation of rapid or irregular heartbeats palpitations suggest arrhythmias. He does not have a history of stroke, diabetes or renal insufficiency.  He is being evaluated now  preoperatively for femoropopliteal bypass which is a relatively low risk procedure from a cardiac standpoint.  PREOPERATIVE CARDIAC RISK ASSESSMENT   Revised Cardiac Risk Index:  High Risk Surgery: no; LOW  Defined as Intraperitoneal, intrathoracic or suprainguinal vascular  Active CAD: yes;   CHF: no;  Cerebrovascular Disease: no;   Diabetes: no; On Insulin: no  CKD (Cr >~ 2): no;   Total: 0 Estimated Risk of Adverse Outcome: LOW  Estimated Risk of MI, PE, VF/VT (Cardiac Arrest), Complete Heart Block: < 1 %   ACC/AHA Guidelines for "Clearance":  Step 1 - Need for Emergency Surgery: No:   If Yes - go straight to OR with perioperative surveillance  Step 2 - Active Cardiac Conditions (Unstable Angina, Decompensated HF, Significant  Arrhytmias - Complete HB, Mobitz II, Symptomatic VT or SVT, Severe Aortic Stenosis - mean gradient > 40 mmHg, Valve area < 1.0 cm2):   No:   If Yes - Evaluate & Treat per ACC/AHA Guidelines  Step 3 -  Low Risk Surgery: Yes  If Yes --> proceed to OR  If No --> Step 4  Step 4 - Functional Capacity >= 4 METS without symptoms: No: limited by claudication  If Yes --> proceed to OR  If No --> Step 5  Step 5 --  Clinical Risk Factors (CRF)   No CRFs: Yes  If Yes --> Proceed to OR  Based on this evaluation, my recommendation will be to proceed to the OR without any further cardiac evaluation. He has had echocardiogram done that is relatively normal as is his EKG. Although he is limited as far as mobility, I don't think that he needs to have a stress test because this would only 50 mL serve to prolong delay his necessary revascularization for resting ischemic leg pain.     Bryan Lemma, M.Stephen., M.S. Interventional Cardiologist   Pager # [phone] Phone # [phone] [address] [place], [place] [address]

## 2016-02-15 NOTE — Progress Notes (Signed)
  Progress Note    02/15/2016 7:49 AM * No surgery date entered *  Subjective:  Pain improved on heparin drip  Vitals:   02/15/16 0545 02/15/16 0612  BP: (!) 195/100 (!) 152/84  Pulse: 86   Resp: 20   Temp: 97.7 F (36.5 C)     Physical Exam: RRR Non labored breathing Palpable femoral pulses bilaterally Monophasic pt on left  CBC    Component Value Date/Time   WBC 7.9 02/15/2016 0250   RBC 4.07 (L) 02/15/2016 0250   HGB 12.6 (L) 02/15/2016 0250   HCT 39.2 02/15/2016 0250   PLT 279 02/15/2016 0250   MCV 96.3 02/15/2016 0250   MCH 31.0 02/15/2016 0250   MCHC 32.1 02/15/2016 0250   RDW 12.9 02/15/2016 0250   LYMPHSABS 1.1 02/13/2016 1519   MONOABS 0.6 02/13/2016 1519   EOSABS 0.0 02/13/2016 1519   BASOSABS 0.0 02/13/2016 1519    BMET    Component Value Date/Time   NA 134 (L) 02/15/2016 0250   K 3.4 (L) 02/15/2016 0250   CL 103 02/15/2016 0250   CO2 25 02/15/2016 0250   GLUCOSE 91 02/15/2016 0250   BUN <5 (L) 02/15/2016 0250   CREATININE 0.83 02/15/2016 0250   CREATININE 0.92 11/13/2013 1031   CALCIUM 8.0 (L) 02/15/2016 0250   GFRNONAA >60 02/15/2016 0250   GFRNONAA >89 11/13/2013 1031   GFRAA >60 02/15/2016 0250   GFRAA >89 11/13/2013 1031    INR    Component Value Date/Time   INR 1.10 02/13/2016 0010    No intake or output data in the 24 hours ending 02/15/16 0749   Assessment:  60 y.o. male admitted with worsening lle pain, ABI on left 0.26 from prevoius 0.7. Echo with EF 55%  Plan: Aortogram today with LLE runoff possible intervention Discussed possibility of need for operative intervention Risks and benefits of angiogram discussed and patient agrees to proceed   Shanisha Lech C. Randie Heinzain, MD [facility] Office: [phone] Pager: [phone]  02/15/2016 7:49 AM

## 2016-02-15 NOTE — Progress Notes (Signed)
PROGRESS NOTE                                                                                                                                                                                                             Patient Demographics:    Stephen Bowen, is a 60 y.o. male, DOB - 01-Nov-1955, ZOX:[id number]  Admit date - 02/13/2016   Admitting Physician Lorretta HarpXilin Niu, MD  Outpatient Primary MD for the patient is PROVIDER NOT IN SYSTEM  LOS - 2  Chief Complaint  Patient presents with  . Leg Pain       Brief Narrative  60 y.o. male with medical history significant of PAD with claudication, hypertension, tobacco abuse, medication noncompliance, who presents with bilateral leg pain,Seen by vascular surgery, started on heparin drip, Went for an aortogram with bilateral lower extremity runoff.  Subjective:    Stephen SanesJerome Bowen today has, No headache, No chest pain, No abdominal pain , Currently denies any lower extremity pain.  Assessment  & Plan :    Principal Problem:   PAD (peripheral artery disease) (HCC) Active Problems:   Hypertensive urgency   Atherosclerosis of native arteries of extremity with intermittent claudication (HCC)   Tobacco use disorder   Leg pain   Hyperkalemia   Lactic acid increased   PAD - pt has advanced peripheral vascular disease with bilateral occlusions that appear compensated.  - Management per vascular surgery , Status post aortogram with bilateral lower extremity runoff 8/23,  patient will need left femoral to TB trunk/PT artery bypass  - continue with heparin GTT . -  on aspirin and statin medications.  Lactic acid increased - most likely due to limb ischemia. Lactic acid level normalized - treat limb ischemia as above - Continue with IV fluids  Hyperkalemia: -  Potassium 5.8 with T-wave peaking in V3-V4 on EKG on admission. - treated with D50, 5 unit of Novolog by IV, 2g of calcium chloride, 30 g of  Kayexalate - Resolved  Hypertensive urgency: - Blood pressure significantly elevated on admission  at 226/145, which improved to 138/98 after treated with 1 dose of clonidine 0.2 mg, metoprolol 5 mg and labetalol 10 mg x 2 in the ED.  - hold Lisinopril-HCTZ due to hyperkalemia and the need of IV fluid - resume Verapamil 120 mg daily - IV hydralazine when necessary  Tobacco abuse: -Counseled -Nicotine patch     Code Status : Full  Family Communication  : None at eb[place]ide  Disposition Plan  : pending further work up.  Consults  :  Vascular surgery  Procedures  : none  DVT Prophylaxis  :  Heparin GTT  Lab Results  Component Value Date   PLT 279 02/15/2016    Antibiotics  :    Anti-infectives    None        Objective:   Vitals:   02/15/16 1219 02/15/16 1224 02/15/16 1229 02/15/16 1250  BP: (!) 161/88 (!) 165/90 (!) 164/87 (!) 149/87  Pulse: 78 76 73 80  Resp: 11 14 17 18   Temp:    97.7 F (36.5 C)  TempSrc:    Oral  SpO2: 97% 97% 97% 100%  Weight:      Height:        Wt Readings from Last 3 Encounters:  02/14/16 57 kg (125 lb 9.6 oz)  08/31/15 61.6 kg (135 lb 12.8 oz)  08/16/15 58.5 kg (129 lb)     Intake/Output Summary (Last 24 hours) at 02/15/16 1340 Last data filed at 02/15/16 1200  Gross per 24 hour  Intake                0 ml  Output              250 ml  Net             -250 ml     Physical Exam( seen before aortogram)  Awake Alert, Oriented X 3,  Supple Neck,No JVD,  Symmetrical Chest wall movement, Good air movement bilaterally, CTAB RRR,No Gallops,Rubs or new Murmurs, No Parasternal Heave +ve B.Sounds, Abd Soft, No tenderness,  No rebound - guarding or rigidity. No cellulitis, gangrene or ischemic wound, significantly diminished pulses in the right lower extremity, absent in distal left lower extremity    Data Review:    CBC  Recent Labs Lab 02/13/16 1519 02/13/16 1621 02/14/16 0321 02/15/16 0250  WBC 9.6  --  8.2 7.9    HGB 16.5 17.7* 13.2 12.6*  HCT 50.3 52.0 40.4 39.2  PLT 334  --  330 279  MCV 96.4  --  96.2 96.3  MCH 31.6  --  31.4 31.0  MCHC 32.8  --  32.7 32.1  RDW 13.0  --  12.9 12.9  LYMPHSABS 1.1  --   --   --   MONOABS 0.6  --   --   --   EOSABS 0.0  --   --   --   BASOSABS 0.0  --   --   --     Chemistries   Recent Labs Lab 02/13/16 1610 02/13/16 1621 02/14/16 0321 02/15/16 0250  NA 137 133* 140 134*  K 5.1 5.8* 4.1 3.4*  CL 99* 102 106 103  CO2 24  --  25 25  GLUCOSE 79 70 96 91  BUN 9 15 9  <5*  CREATININE 0.92 0.90 0.95 0.83  CALCIUM 9.1  --  8.8* 8.0*  AST  --   --  16  --   ALT  --   --  19  --   ALKPHOS  --   --  69  --   BILITOT  --   --  1.5*  --    ------------------------------------------------------------------------------------------------------------------  Recent Labs  02/14/16 0321  CHOL 159  HDL 45  LDLCALC 99  TRIG 73  CHOLHDL 3.5  Lab Results  Component Value Date   HGBA1C 5.5 02/14/2016   ------------------------------------------------------------------------------------------------------------------ No results for input(s): TSH, T4TOTAL, T3FREE, THYROIDAB in the last 72 hours.  Invalid input(s): FREET3 ------------------------------------------------------------------------------------------------------------------ No results for input(s): VITAMINB12, FOLATE, FERRITIN, TIBC, IRON, RETICCTPCT in the last 72 hours.  Coagulation profile  Recent Labs Lab 02/13/16 0010  INR 1.10    No results for input(s): DDIMER in the last 72 hours.  Cardiac Enzymes No results for input(s): CKMB, TROPONINI, MYOGLOBIN in the last 168 hours.  Invalid input(s): CK ------------------------------------------------------------------------------------------------------------------ No results found for: BNP  Inpatient Medications  Scheduled Meds: . [MAR Hold] aspirin  325 mg Oral Daily  . [MAR Hold] atorvastatin  40 mg Oral q1800  . [MAR Hold]  lisinopril  10 mg Oral Daily   And  . [MAR Hold] hydrochlorothiazide  12.5 mg Oral Daily  . [MAR Hold] nicotine  21 mg Transdermal Q24H  . [MAR Hold] sodium chloride flush  3 mL Intravenous Q12H  . [MAR Hold] verapamil  120 mg Oral Daily   Continuous Infusions: . sodium chloride 75 mL/hr at 02/14/16 1147  . heparin 900 Units/hr (02/14/16 1931)   PRN Meds:.[MAR Hold] acetaminophen **OR** [MAR Hold] acetaminophen, [MAR Hold] hydrALAZINE, [MAR Hold] HYDROcodone-acetaminophen, morphine injection  Micro Results No results found for this or any previous visit (from the past 240 hour(s)).  Radiology Reports Ct Angio Ao+bifem W &/or Wo Contrast  Result Date: 02/13/2016 CLINICAL DATA:  Pain aggravated by walking. No rest pain and intact pulses per report. EXAM: CT ANGIOGRAPHY AOBIFEM WITHOUT AND WITH CONTRAST TECHNIQUE: CTA of the abdominal aorta bilateral lower extremities was performed in the arterial phase. Multiplanar reformats were generated. CONTRAST:  100 cc Isovue 370 intravenous COMPARISON:  None. FINDINGS: Vascular: Aorta: Standard aortic branching. Diffuse atheromatous wall thickening and calcification. No aneurysm or dissection. Visceral branches: No major branch occlusion. The celiac and SMA are without flow limiting stenosis. Moderate right renal ostial stenosis. Patent IMA. Right Leg: Iliacs: Diffuse atheromatous changes. No common or external iliac flow limiting stenosis. Most branches of the hypogastric artery are occluded. Negative for aneurysm. Common femoral: Heavily diseased with atheromatous plaque. No flow limiting stenosis. Superficial femoral artery: Moderate narrowing just beyond the common femoral bifurcation. Occlusion near at the hiatus with well-formed muscular collaterals proximal to the occlusion. Profunda femoris artery: Heavily diseased and stenotic proximally with first order branch occlusion. The remaining arterial bed is occluded after muscular branches into the medial  compartment. Popliteal artery:  Occluded throughout its extent. Below the knee: Collateral flow reconstitutes the anterior tibial artery. The tibioperoneal trunk, tibial, and peroneal arteries are thready proximally but are ramified distally and there is essentially three-vessel runoff that is symmetrically timed to the left. Left leg: Iliacs: Approximately 50% stenosis of the common iliac artery by low-density plaque. Multiple branches of the hypogastric artery are occluded. Common femoral: Heavily diseased with atheromatous plaque. No flow limiting stenosis. Superficial femoral: Occluded from its origin to near the hiatus where there are well-formed collaterals. Profunda femoris: Moderate to advanced stenosis at the origin with diffuse atheromatous narrowing and luminal irregularity, but patent serving multiple muscular branches in the thigh. Popliteal artery: Reconstituted below the hiatus by well-formed collaterals. Heavily diseased throughout its course with multi focal high-grade stenosis. At the level of the knee there is bulky calcified plaque. Short segment occlusion below the knee. Below the knee: No flow until collaterals weakly reconstitute the anterior tibial artery which is thready at the ankle. Collateral also reconstitutes the posterior  tibial which is thready at the ankle. No peroneal reconstitution. Nonvascular: Lower chest and abdominal wall:  No contributory findings. Hepatobiliary: No focal liver abnormality.No evidence of biliary obstruction or stone. Pancreas: Unremarkable. Spleen: Hypervascular mass in the medial spleen measuring 9 mm, nonspecific but often incidental hemangioma. Adrenals/Urinary Tract: Negative adrenals. Symmetric renal enhancement. No hydronephrosis. Excretion of contrast limits detection of urolithiasis. Unremarkable bladder. Stomach/Bowel:  No obstruction. No inflammation. Reproductive:No pathologic findings. Vascular/Lymphatic: No acute vascular abnormality. No mass or  adenopathy. Other: No ascites or pneumoperitoneum. Musculoskeletal: No acute abnormalities. Remote left tibia fracture status post ORIF with intra medullary nail. Remote and healed fibular diaphysis fracture on the left. These results were called by telephone at the time of interpretation on 02/13/2016 at ~7:10 pm to Dr. Nelva Nay , who verbally acknowledged these results. Review of the MIP images confirms the above findings. IMPRESSION: 1. Advanced peripheral vascular disease with bilateral occlusions that appear compensated. 2. Right SFA occlusion near the adductor hiatus with infrapopliteal reconstitution and three-vessel runoff. Heavily diseased profunda femoris with early branch occlusions. 3. Early left SFA occlusion with thready discontinuous popliteal and below the knee reconstitution. Single vessel/posterior tibial runoff at the ankle. 4. No flow limiting aortic or iliac stenosis. 5. Moderate right renal ostial stenosis. Electronically Signed   By: Marnee Spring M.D.   On: 02/13/2016 19:21     Bardia Wangerin M.D on 02/15/2016 at 1:40 PM  Between 7am to 7pm - Pager - [phone]  After 7pm go to [email or web address] - password [facility]  [facility] -  Office  [phone]

## 2016-02-15 NOTE — Progress Notes (Signed)
Sheath removal note: 645fr sheath was removed from right femoral artery. Manual pressure held for 20 minutes. VSS throughout. Post instructions given & pt verbalized understanding. Tegaderm & gauzed placed for dressing. Bedrest for 4hr starting at 1230.

## 2016-02-15 NOTE — Op Note (Signed)
    Patient name: Stephen ApleyJerome D Snuffer MRN: [id number] DOB: 28-Dec-1955 Sex: male  02/13/2016 - 02/15/2016 Pre-operative Diagnosis: Critical limb ischemia Post-operative diagnosis:  Same Surgeon:  Lemar LivingsBrandon AmeLie Hollars, MD Procedure Performed:  1.  US guided cannulation Right common femoral artery  2.  Aortogram with bilateral lower extremity runoff  Indications:  60yo male with new onset left lower extremity pain and ABI 0.2. He is now indicated for aortogram with possible intervention of LLE. He has been consented for risks and benefits and agrees to proceed.   Procedure:  The patient was identified in the holding area and taken to room 8.  The patient was then placed supine on the table and prepped and draped in the usual sterile fashion.  A time out was called.  Ultrasound was used to evaluate the right common femoral artery.  It was patent .  A digital ultrasound image was acquired.  A micropuncture needle was used to access the right common femoral artery under ultrasound guidance.  An 018 wire was advanced without resistance and a micropuncture sheath was placed.  The 018 wire was removed and a benson wire was placed.  The micropuncture sheath was exchanged for a 5 french sheath.  An omniflush catheter was advanced over the wire to the level of L-1.  An abdominal angiogram was obtained.  Next, using the omniflush catheter and a benson wire, the aortic bifurcation was crossed and the catheter was placed into theleft external iliac artery and left runoff was obtained.  right runoff was performed via retrograde sheath injections. The procedure was completed and sheath flushed.   Findings:   Aortogram:  Stenosis of left common iliac artery  Right Lower Extremity:  Occlusion of right SFA distal segment reconstitution of 18 after takeoff.  Left Lower Extremity:  Occlusion of SFA at common femoral artery. Multiple stenoses of profunda branches. Reconstitution of distal SFA with occlusion of popliteal at the level  of the knee. Reconstitution of PT artery after takeoff of tp trunk. Likely early takeoff of At.   Will need left femoral to TP trunk/PT artery bypass.    Lavone Barrientes C. Randie Heinzain, MD [facility] [place] Office: [phone] Pager: [phone]

## 2016-02-15 NOTE — Progress Notes (Signed)
ANTICOAGULATION CONSULT NOTE - Follow Up Consult  Pharmacy Consult for Heparin  Indication: arterial occlusion  No Known Allergies  Patient Measurements: Height: 5\' 8"  (172.7 cm) Weight: 125 lb 9.6 oz (57 kg) IBW/kg (Calculated) : 68.4  Vital Signs: Temp: 97.7 F (36.5 C) (08/23 0545) Temp Source: Oral (08/23 0545) BP: 155/94 (08/23 0759) Pulse Rate: 86 (08/23 0545)  Labs:  Recent Labs  02/13/16 0010  02/13/16 1519  02/13/16 1621 02/14/16 0257 02/14/16 0321 02/14/16 1226 02/15/16 0249 02/15/16 0250  HGB  --   < > 16.5  --  17.7*  --  13.2  --   --  12.6*  HCT  --   < > 50.3  --  52.0  --  40.4  --   --  39.2  PLT  --   --  334  --   --   --  330  --   --  279  APTT 123*  --   --   --   --   --   --   --   --   --   LABPROT 14.2  --   --   --   --   --   --   --   --   --   INR 1.10  --   --   --   --   --   --   --   --   --   HEPARINUNFRC  --   --   --   --   --  0.85*  --  0.47 0.45  --   CREATININE  --   --   --   < > 0.90  --  0.95  --   --  0.83  < > = values in this interval not displayed.  Estimated Creatinine Clearance: 76.3 mL/min (by C-G formula based on SCr of 0.83 mg/dL).   Assessment: 60 y/o M with arterial occlusion, possible endovascular intervention on Wednesday, on heparin drip for now, initial heparin level is slightly high, no issues per RN.   This morning's HL remains therapeutic at 0.45 on heparin 900 units/hr. No issues with infusion or bleeding noted.   Goal of Therapy:  Heparin level 0.3-0.7 units/ml Monitor platelets by anticoagulation protocol: Yes   Plan:  Continue heparin 900 units/hr Daily HL Monitor s/sx of bleeding F/u angiograms  Arlean Hoppingorey M. Newman PiesBall, PharmD, BCPS Clinical Pharmacist Pager [phone] 02/15/2016,8:28 AM

## 2016-02-16 ENCOUNTER — Encounter (HOSPITAL_COMMUNITY): Payer: Self-pay | Admitting: Certified Registered"

## 2016-02-16 ENCOUNTER — Encounter (HOSPITAL_COMMUNITY)
Admission: EM | Disposition: A | Discharge status: Home / Self Care | Payer: Self-pay | Source: Home / Self Care | Attending: Internal Medicine

## 2016-02-16 ENCOUNTER — Inpatient Hospital Stay (HOSPITAL_COMMUNITY): Payer: Self-pay | Admitting: Anesthesiology

## 2016-02-16 HISTORY — PX: PATCH ANGIOPLASTY: SHX6230

## 2016-02-16 HISTORY — PX: ENDARTERECTOMY FEMORAL: SHX5804

## 2016-02-16 HISTORY — PX: FEMORAL-TIBIAL BYPASS GRAFT: SHX938

## 2016-02-16 HISTORY — PX: FEMORAL BYPASS: SHX50

## 2016-02-16 LAB — BASIC METABOLIC PANEL
Anion gap: 7 (ref 5–15)
CALCIUM: 8.4 mg/dL — AB (ref 8.9–10.3)
CHLORIDE: 100 mmol/L — AB (ref 101–111)
CO2: 28 mmol/L (ref 22–32)
Creatinine, Ser: 0.83 mg/dL (ref 0.61–1.24)
GFR calc Af Amer: >60 mL/min (ref >60)
GFR calc non Af Amer: >60 mL/min (ref >60)
Glucose, Bld: 89 mg/dL (ref 65–99)
Potassium: 3.7 mmol/L (ref 3.5–5.1)
SODIUM: 135 mmol/L (ref 135–145)

## 2016-02-16 LAB — CBC
HCT: 38.8 % — ABNORMAL LOW (ref 39.0–52.0)
HEMOGLOBIN: 12.4 g/dL — AB (ref 13.0–17.0)
MCH: 31 pg (ref 26.0–34.0)
MCHC: 32 g/dL (ref 30.0–36.0)
MCV: 97 fL (ref 78.0–100.0)
Platelets: 304 10*3/uL (ref 150–400)
RBC: 4 MIL/uL — ABNORMAL LOW (ref 4.22–5.81)
RDW: 12.9 % (ref 11.5–15.5)
WBC: 8.1 10*3/uL (ref 4.0–10.5)

## 2016-02-16 LAB — HEPARIN LEVEL (UNFRACTIONATED): Heparin Unfractionated: 0.38 IU/mL (ref 0.30–0.70)

## 2016-02-16 SURGERY — CREATION, BYPASS, ARTERIAL, FEMORAL TO TIBIAL, USING GRAFT
Anesthesia: General | Site: Leg Lower | Laterality: Left

## 2016-02-16 MED ORDER — ENOXAPARIN SODIUM 40 MG/0.4ML ~~LOC~~ SOLN: 40.0000 mg | SUBCUTANEOUS | Status: DC

## 2016-02-16 MED ORDER — DEXTROSE 5 % IV SOLN
1.5000 g | Freq: Two times a day (BID) | INTRAVENOUS | Status: AC
  Administered 2016-02-16 – 2016-02-17 (×2): 1.5 g via INTRAVENOUS
  Filled 2016-02-16 (×2): qty 1.5

## 2016-02-16 MED ORDER — HEMOSTATIC AGENTS (NO CHARGE) OPTIME
TOPICAL | Status: DC | PRN
  Administered 2016-02-16: 2 via TOPICAL

## 2016-02-16 MED ORDER — PHENYLEPHRINE HCL 10 MG/ML IJ SOLN
INTRAVENOUS | Status: DC | PRN
  Administered 2016-02-16: 40 ug/min via INTRAVENOUS

## 2016-02-16 MED ORDER — MIDAZOLAM HCL 2 MG/2ML IJ SOLN
INTRAMUSCULAR | Status: AC
  Filled 2016-02-16: qty 2

## 2016-02-16 MED ORDER — LACTATED RINGERS IV SOLN
INTRAVENOUS | Status: DC
  Administered 2016-02-16 (×3): via INTRAVENOUS

## 2016-02-16 MED ORDER — ONDANSETRON HCL 4 MG/2ML IJ SOLN
INTRAMUSCULAR | Status: AC
  Filled 2016-02-16: qty 2

## 2016-02-16 MED ORDER — PHENYLEPHRINE 40 MCG/ML (10ML) SYRINGE FOR IV PUSH (FOR BLOOD PRESSURE SUPPORT)
PREFILLED_SYRINGE | INTRAVENOUS | Status: DC | PRN
Start: 2016-02-16 — End: 2016-02-16
  Administered 2016-02-16: 80 ug via INTRAVENOUS

## 2016-02-16 MED ORDER — MAGNESIUM SULFATE 2 GM/50ML IV SOLN
2.0000 g | Freq: Every day | INTRAVENOUS | Status: DC | PRN
  Filled 2016-02-16: qty 50

## 2016-02-16 MED ORDER — FENTANYL CITRATE (PF) 100 MCG/2ML IJ SOLN
INTRAMUSCULAR | Status: AC
  Filled 2016-02-16: qty 2

## 2016-02-16 MED ORDER — ALUM & MAG HYDROXIDE-SIMETH 200-200-20 MG/5ML PO SUSP: 15.0000 mL | ORAL | Status: DC | PRN

## 2016-02-16 MED ORDER — PROTAMINE SULFATE 10 MG/ML IV SOLN
INTRAVENOUS | Status: AC
  Filled 2016-02-16: qty 5

## 2016-02-16 MED ORDER — IOPAMIDOL (ISOVUE-300) INJECTION 61%
INTRAVENOUS | Status: AC
  Filled 2016-02-16: qty 50

## 2016-02-16 MED ORDER — PANTOPRAZOLE SODIUM 40 MG PO TBEC
40.0000 mg | DELAYED_RELEASE_TABLET | Freq: Every day | ORAL | Status: DC
  Administered 2016-02-16 – 2016-02-19 (×4): 40 mg via ORAL
  Filled 2016-02-16 (×4): qty 1

## 2016-02-16 MED ORDER — LIDOCAINE 2% (20 MG/ML) 5 ML SYRINGE
INTRAMUSCULAR | Status: AC
  Filled 2016-02-16: qty 5

## 2016-02-16 MED ORDER — FENTANYL CITRATE (PF) 100 MCG/2ML IJ SOLN
INTRAMUSCULAR | Status: DC | PRN
  Administered 2016-02-16 (×2): 50 ug via INTRAVENOUS
  Administered 2016-02-16: 100 ug via INTRAVENOUS
  Administered 2016-02-16: 150 ug via INTRAVENOUS

## 2016-02-16 MED ORDER — SODIUM CHLORIDE 0.9 % IV SOLN
INTRAVENOUS | Status: DC | PRN
  Administered 2016-02-16: 500 mL

## 2016-02-16 MED ORDER — 0.9 % SODIUM CHLORIDE (POUR BTL) OPTIME
TOPICAL | Status: DC | PRN
  Administered 2016-02-16: 2000 mL
  Administered 2016-02-16: 1000 mL

## 2016-02-16 MED ORDER — SODIUM CHLORIDE 0.9 % IV SOLN: 500.0000 mL | Freq: Once | INTRAVENOUS | Status: DC | PRN

## 2016-02-16 MED ORDER — PROPOFOL 10 MG/ML IV BOLUS
INTRAVENOUS | Status: DC | PRN
  Administered 2016-02-16: 150 mg via INTRAVENOUS

## 2016-02-16 MED ORDER — ROCURONIUM BROMIDE 10 MG/ML (PF) SYRINGE
PREFILLED_SYRINGE | INTRAVENOUS | Status: DC | PRN
  Administered 2016-02-16: 50 mg via INTRAVENOUS

## 2016-02-16 MED ORDER — POTASSIUM CHLORIDE CRYS ER 20 MEQ PO TBCR: 20.0000 meq | EXTENDED_RELEASE_TABLET | Freq: Every day | ORAL | Status: DC | PRN

## 2016-02-16 MED ORDER — SENNOSIDES-DOCUSATE SODIUM 8.6-50 MG PO TABS: 1.0000 | ORAL_TABLET | Freq: Every evening | ORAL | Status: DC | PRN

## 2016-02-16 MED ORDER — HEPARIN SODIUM (PORCINE) 1000 UNIT/ML IJ SOLN
INTRAMUSCULAR | Status: AC
  Filled 2016-02-16: qty 1

## 2016-02-16 MED ORDER — SODIUM CHLORIDE 0.9 % IV SOLN
INTRAVENOUS | Status: DC
  Administered 2016-02-16 – 2016-02-17 (×2): via INTRAVENOUS

## 2016-02-16 MED ORDER — HEPARIN (PORCINE) IN NACL 100-0.45 UNIT/ML-% IJ SOLN
500.0000 [IU]/h | INTRAMUSCULAR | Status: DC
  Administered 2016-02-16: 500 [IU]/h via INTRAVENOUS
  Filled 2016-02-16: qty 250

## 2016-02-16 MED ORDER — PROPOFOL 10 MG/ML IV BOLUS
INTRAVENOUS | Status: AC
  Filled 2016-02-16: qty 20

## 2016-02-16 MED ORDER — FENTANYL CITRATE (PF) 100 MCG/2ML IJ SOLN
INTRAMUSCULAR | Status: AC
  Filled 2016-02-16: qty 4

## 2016-02-16 MED ORDER — DOCUSATE SODIUM 100 MG PO CAPS
100.0000 mg | ORAL_CAPSULE | Freq: Every day | ORAL | Status: DC
  Administered 2016-02-17 – 2016-02-19 (×3): 100 mg via ORAL
  Filled 2016-02-16 (×3): qty 1

## 2016-02-16 MED ORDER — BISACODYL 5 MG PO TBEC: 5.0000 mg | DELAYED_RELEASE_TABLET | Freq: Every day | ORAL | Status: DC | PRN

## 2016-02-16 MED ORDER — LIDOCAINE 2% (20 MG/ML) 5 ML SYRINGE
INTRAMUSCULAR | Status: DC | PRN
  Administered 2016-02-16: 20 mg via INTRAVENOUS

## 2016-02-16 MED ORDER — PHENOL 1.4 % MT LIQD: 1.0000 | OROMUCOSAL | Status: DC | PRN

## 2016-02-16 MED ORDER — MIDAZOLAM HCL 5 MG/5ML IJ SOLN
INTRAMUSCULAR | Status: DC | PRN
  Administered 2016-02-16: 2 mg via INTRAVENOUS

## 2016-02-16 MED ORDER — ONDANSETRON HCL 4 MG/2ML IJ SOLN: 4.0000 mg | Freq: Four times a day (QID) | INTRAMUSCULAR | Status: DC | PRN

## 2016-02-16 MED ORDER — LABETALOL HCL 5 MG/ML IV SOLN
10.0000 mg | INTRAVENOUS | Status: DC | PRN
  Administered 2016-02-18: 10 mg via INTRAVENOUS
  Filled 2016-02-16: qty 4

## 2016-02-16 MED ORDER — PAPAVERINE HCL 30 MG/ML IJ SOLN
INTRAMUSCULAR | Status: AC
Start: 2016-02-16 — End: 2016-02-16
  Filled 2016-02-16: qty 2

## 2016-02-16 MED ORDER — PROTAMINE SULFATE 10 MG/ML IV SOLN
INTRAVENOUS | Status: DC | PRN
  Administered 2016-02-16: 20 mg via INTRAVENOUS

## 2016-02-16 MED ORDER — MORPHINE SULFATE (PF) 2 MG/ML IV SOLN: 2.0000 mg | INTRAVENOUS | Status: DC | PRN

## 2016-02-16 MED ORDER — ONDANSETRON HCL 4 MG/2ML IJ SOLN
INTRAMUSCULAR | Status: DC | PRN
  Administered 2016-02-16: 4 mg via INTRAVENOUS

## 2016-02-16 MED ORDER — GUAIFENESIN-DM 100-10 MG/5ML PO SYRP: 15.0000 mL | ORAL_SOLUTION | ORAL | Status: DC | PRN

## 2016-02-16 MED ORDER — METOPROLOL TARTRATE 5 MG/5ML IV SOLN: 2.0000 mg | INTRAVENOUS | Status: DC | PRN

## 2016-02-16 MED ORDER — HEPARIN SODIUM (PORCINE) 1000 UNIT/ML IJ SOLN
INTRAMUSCULAR | Status: DC | PRN
  Administered 2016-02-16: 6000 [IU] via INTRAVENOUS
  Administered 2016-02-16: 3000 [IU] via INTRAVENOUS

## 2016-02-16 SURGICAL SUPPLY — 70 items
BANDAGE ELASTIC 4 VELCRO ST LF (GAUZE/BANDAGES/DRESSINGS) IMPLANT
BANDAGE ESMARK 6X9 LF (GAUZE/BANDAGES/DRESSINGS) ×2 IMPLANT
BNDG ESMARK 6X9 LF (GAUZE/BANDAGES/DRESSINGS) ×3
CANISTER SUCTION 2500CC (MISCELLANEOUS) ×3 IMPLANT
CANNULA VESSEL 3MM 2 BLNT TIP (CANNULA) IMPLANT
CLIP TI MEDIUM 24 (CLIP) ×3 IMPLANT
CLIP TI WIDE RED SMALL 24 (CLIP) ×3 IMPLANT
CUFF TOURNIQUET SINGLE 24IN (TOURNIQUET CUFF) ×3 IMPLANT
CUFF TOURNIQUET SINGLE 34IN LL (TOURNIQUET CUFF) IMPLANT
CUFF TOURNIQUET SINGLE 44IN (TOURNIQUET CUFF) IMPLANT
DRAIN CHANNEL 15F RND FF W/TCR (WOUND CARE) IMPLANT
DRAPE PROXIMA HALF (DRAPES) IMPLANT
DRAPE X-RAY CASS 24X20 (DRAPES) IMPLANT
DRSG COVADERM 4X10 (GAUZE/BANDAGES/DRESSINGS) IMPLANT
DRSG COVADERM 4X8 (GAUZE/BANDAGES/DRESSINGS) IMPLANT
ELECT REM PT RETURN 9FT ADLT (ELECTROSURGICAL) ×3
ELECTRODE REM PT RTRN 9FT ADLT (ELECTROSURGICAL) ×2 IMPLANT
EVACUATOR SILICONE 100CC (DRAIN) IMPLANT
GLOVE BIO SURGEON STRL SZ7.5 (GLOVE) ×6 IMPLANT
GLOVE BIOGEL PI IND STRL 6.5 (GLOVE) ×8 IMPLANT
GLOVE BIOGEL PI IND STRL 7.0 (GLOVE) ×4 IMPLANT
GLOVE BIOGEL PI IND STRL 7.5 (GLOVE) ×4 IMPLANT
GLOVE BIOGEL PI IND STRL 8 (GLOVE) ×2 IMPLANT
GLOVE BIOGEL PI INDICATOR 6.5 (GLOVE) ×4
GLOVE BIOGEL PI INDICATOR 7.0 (GLOVE) ×2
GLOVE BIOGEL PI INDICATOR 7.5 (GLOVE) ×2
GLOVE BIOGEL PI INDICATOR 8 (GLOVE) ×1
GLOVE ECLIPSE 6.5 STRL STRAW (GLOVE) ×6 IMPLANT
GLOVE ECLIPSE 7.0 STRL STRAW (GLOVE) ×6 IMPLANT
GLOVE SS N UNI LF 6.5 STRL (GLOVE) ×6 IMPLANT
GOWN STRL REUS W/ TWL LRG LVL3 (GOWN DISPOSABLE) ×10 IMPLANT
GOWN STRL REUS W/ TWL XL LVL3 (GOWN DISPOSABLE) ×2 IMPLANT
GOWN STRL REUS W/TWL LRG LVL3 (GOWN DISPOSABLE) ×5
GOWN STRL REUS W/TWL XL LVL3 (GOWN DISPOSABLE) ×1
GRAFT PROPATEN W/RING 6X80X60 (Vascular Products) ×3 IMPLANT
HEMOSTAT SPONGE AVITENE ULTRA (HEMOSTASIS) ×3 IMPLANT
HEMOSTAT SURGICEL 2X14 (HEMOSTASIS) ×3 IMPLANT
INSERT FOGARTY SM (MISCELLANEOUS) ×3 IMPLANT
KIT BASIN OR (CUSTOM PROCEDURE TRAY) ×3 IMPLANT
KIT ROOM TURNOVER OR (KITS) ×3 IMPLANT
LIQUID BAND (GAUZE/BANDAGES/DRESSINGS) ×9 IMPLANT
MARKER GRAFT CORONARY BYPASS (MISCELLANEOUS) IMPLANT
NS IRRIG 1000ML POUR BTL (IV SOLUTION) ×9 IMPLANT
PACK PERIPHERAL VASCULAR (CUSTOM PROCEDURE TRAY) ×3 IMPLANT
PAD ARMBOARD 7.5X6 YLW CONV (MISCELLANEOUS) ×6 IMPLANT
PADDING CAST COTTON 6X4 STRL (CAST SUPPLIES) IMPLANT
PATCH VASC XENOSURE 1CMX6CM (Vascular Products) ×1 IMPLANT
PATCH VASC XENOSURE 1X6 (Vascular Products) ×2 IMPLANT
SET COLLECT BLD 21X3/4 12 (NEEDLE) IMPLANT
SPONGE LAP 18X18 X RAY DECT (DISPOSABLE) ×6 IMPLANT
STOPCOCK 4 WAY LG BORE MALE ST (IV SETS) IMPLANT
SUT ETHILON 3 0 PS 1 (SUTURE) IMPLANT
SUT GORETEX 6.0 TT13 (SUTURE) ×3 IMPLANT
SUT GORETEX 6.0 TT9 (SUTURE) ×3 IMPLANT
SUT MNCRL AB 4-0 PS2 18 (SUTURE) ×9 IMPLANT
SUT PROLENE 5 0 C 1 24 (SUTURE) ×12 IMPLANT
SUT PROLENE 6 0 BV (SUTURE) ×6 IMPLANT
SUT PROLENE 7 0 BV 1 (SUTURE) IMPLANT
SUT SILK 2 0 SH (SUTURE) ×3 IMPLANT
SUT SILK 3 0 (SUTURE)
SUT SILK 3-0 18XBRD TIE 12 (SUTURE) IMPLANT
SUT VIC AB 2-0 CT1 27 (SUTURE) ×2
SUT VIC AB 2-0 CT1 TAPERPNT 27 (SUTURE) ×4 IMPLANT
SUT VIC AB 3-0 SH 27 (SUTURE) ×2
SUT VIC AB 3-0 SH 27X BRD (SUTURE) ×4 IMPLANT
TAPE UMBILICAL COTTON 1/8X30 (MISCELLANEOUS) IMPLANT
TRAY FOLEY W/METER SILVER 16FR (SET/KITS/TRAYS/PACK) ×3 IMPLANT
TUBING EXTENTION W/L.L. (IV SETS) IMPLANT
UNDERPAD 30X30 INCONTINENT (UNDERPADS AND DIAPERS) ×3 IMPLANT
WATER STERILE IRR 1000ML POUR (IV SOLUTION) ×3 IMPLANT

## 2016-02-16 NOTE — Anesthesia Preprocedure Evaluation (Addendum)
Anesthesia Evaluation  Patient identified by MRN, date of birth, ID band Patient awake    Reviewed: Allergy & Precautions, NPO status , Patient's Chart, lab work & pertinent test results  Airway Mallampati: II  TM Distance: >3 FB Neck ROM: Full    Dental  (+) Poor Dentition, Dental Advisory Given   Pulmonary Current Smoker,    breath sounds clear to auscultation       Cardiovascular hypertension, + Peripheral Vascular Disease   Rhythm:Regular Rate:Normal  02/14/16 ECHO:  EF 55-60%, valves OK   Neuro/Psych    GI/Hepatic   Endo/Other    Renal/GU      Musculoskeletal   Abdominal   Peds  Hematology   Anesthesia Other Findings   Reproductive/Obstetrics                           Anesthesia Physical Anesthesia Plan  ASA: III  Anesthesia Plan: General   Post-op Pain Management:    Induction: Intravenous  Airway Management Planned: Oral ETT  Additional Equipment: None  Intra-op Plan:   Post-operative Plan: Extubation in OR  Informed Consent: I have reviewed the patients History and Physical, chart, labs and discussed the procedure including the risks, benefits and alternatives for the proposed anesthesia with the patient or authorized representative who has indicated his/her understanding and acceptance.   Dental advisory given  Plan Discussed with: CRNA, Anesthesiologist and Surgeon  Anesthesia Plan Comments:         Anesthesia Quick Evaluation

## 2016-02-16 NOTE — Anesthesia Postprocedure Evaluation (Signed)
Anesthesia Post Note  Patient: Stephen Bowen  Procedure(s) Performed: Procedure(s) (LRB): LEFT FEMORAL-POSTERIOR TIBIAL ARTERY BYPASS  WITH PROPATEN 6 MM X 80 CM VASCULAR RING GRAFT (Left) LEFT FEMORAL ENDARTERECTOMY (Left) PATCH ANGIOPLASTY WITH XENOSURE BIOLOGIC PATCH 1 CM X 6 CM (Left)  Patient location during evaluation: PACU Anesthesia Type: General Level of consciousness: awake, awake and alert and oriented Pain management: pain level controlled Vital Signs Assessment: post-procedure vital signs reviewed and stable Respiratory status: spontaneous breathing, nonlabored ventilation and respiratory function stable Cardiovascular status: blood pressure returned to baseline Postop Assessment: no headache Anesthetic complications: no    Last Vitals:  Vitals:   02/16/16 1645 02/16/16 1657  BP: (!) 146/90 (!) 150/86  Pulse:  95  Resp:  14  Temp:  36.7 C    Last Pain:  Vitals:   02/16/16 1657  TempSrc: Oral  PainSc:                  [name]

## 2016-02-16 NOTE — Transfer of Care (Signed)
Immediate Anesthesia Transfer of Care Note  Patient: Stephen ApleyJerome D Wittwer  Procedure(s) Performed: Procedure(s): LEFT FEMORAL-POSTERIOR TIBIAL ARTERY BYPASS  WITH PROPATEN 6 MM X 80 CM VASCULAR RING GRAFT (Left) LEFT FEMORAL ENDARTERECTOMY (Left) PATCH ANGIOPLASTY WITH XENOSURE BIOLOGIC PATCH 1 CM X 6 CM (Left)  Patient Location: PACU  Anesthesia Type:General  Level of Consciousness: awake, oriented and patient cooperative  Airway & Oxygen Therapy: Patient Spontanous Breathing and Patient connected to nasal cannula oxygen  Post-op Assessment: Report given to RN, Post -op Vital signs reviewed and stable and Patient moving all extremities  Post vital signs: Reviewed and stable  Last Vitals:  Vitals:   02/16/16 1000 02/16/16 1101  BP: (!) 197/97 (!) 190/104  Pulse: 85   Resp: 18   Temp: 36.5 C     Last Pain:  Vitals:   02/16/16 1000  TempSrc: Oral  PainSc: 6          Complications: No apparent anesthesia complications

## 2016-02-16 NOTE — Anesthesia Procedure Notes (Deleted)
Performed by: Roshini Fulwider R       

## 2016-02-16 NOTE — Anesthesia Procedure Notes (Addendum)
Procedure Name: Intubation Date/Time: 02/16/2016 12:23 PM Performed by: Charm BargesBUTLER, Kingston Shawgo R Pre-anesthesia Checklist: Patient identified, Emergency Drugs available, Suction available and Patient being monitored Patient Re-evaluated:Patient Re-evaluated prior to inductionOxygen Delivery Method: Circle System Utilized Preoxygenation: Pre-oxygenation with 100% oxygen Intubation Type: IV induction Ventilation: Mask ventilation without difficulty Laryngoscope Size: Mac and 4 Grade View: Grade II Tube type: Oral Tube size: 8.0 mm Number of attempts: 1 Airway Equipment and Method: Stylet and Oral airway Placement Confirmation: ETT inserted through vocal cords under direct vision,  positive ETCO2 and breath sounds checked- equal and bilateral Secured at: 22 cm Tube secured with: Tape Dental Injury: Teeth and Oropharynx as per pre-operative assessment

## 2016-02-16 NOTE — Op Note (Signed)
Patient name: Stephen Bowen MRN: [id number] DOB: [birth year] Sex: male  02/16/2016 Pre-operative Diagnosis: critical limb ischemia Post-operative diagnosis:  Same Surgeon:  Lemar LivingsBrandon Royce Sciara, MD Assistants: Cari Carawayhris Dickson, MD ; Lianne CureMaureen Collins, PA Procedure Performed:  1.  Left femoral endarterectomy with patch angioplasty with bovine pericardium  2.  Left femoral to PT artery bypass with 6 mm propaten graft and vein patch angioplasty   Indications:  Stephen Bowen is a 60 year old male with a history of claudication recently presented to the ER with rest pain in his left lower extremity. He has undergone angiogram and has significant arterial disease in his left leg with dominant PT runoff to the foot. He has been consented on the risks and benefits of the above procedure and he agrees to proceed.  Procedure:  Patient was correctly identified and taken to the operating room where he was placed supine on the operating table antibiotics administered general endotracheal anesthesia induced and a timeout called. He was sterilely prepped and draped in the usual fashion. Following this we began our operation by vertical incision in his left groin crossing the inguinal ligament. We dissected down to the common femoral artery and cervical branches is widely as well as the SFA common femoral profunda femoris arteries with vessel loops. We then turned our attention below the knee where we first exposed the TP trunk had no Doppler single and extended down the leg to the level of the PT artery. We still had no Doppler signal but there was a soft artery here. We then created a tunnel between the 2 incisions using a Gore tunneler the patient was given heparin 6000 units. After heparin had circulated for 2 minutes we then clamped our common femoral artery followed by profunda femoris arteries. We opened the vessel longitudinally with 11 blade followed by Potts scissors were we encounter significant plaque. We had to  then extend our incision cephalad on the vessel. Endarterectomy was performed as well as patch angioplasty with bovine pericardium. This was sewn in place with 5-0 Prolene suture. Following this we then tunneled our graft which was 6 mm propatent. We sewed the graft after trimming it end to side to the patch angioplasty and the common femoral artery with CV 6 Gore suture. We then turned our attention to the distal incision. On the way aching or incision initially we had harvested a 5 cm segment of vein graft. We then clamped our PT artery proximally and distally with bulldog clamps opened longitudinally with 11 blade followed by Potts scissors. The harvested saphenous vein was then opened longitudinally trimmed to size and sewn as a vein patch onto the PT using 6-0 Prolene suture. Following this we opened the vein patch artery vertically and trimmed our graft with the leg extended sewed it end to side with CV 6 Gore-Tex suture. We allowed the graft and vessels to backbleed and forward bleed prior to releasing our clamps and complete our anastomosis. Following this we did have a strong signal in the graft and at the PT and the incision as well as at the level of the ankle. 20 mg of protamine was administered hemostasis was obtained and the wounds closed in multiple layers with 4-0 Monocryl at the level of the skin and Dermabond above that. Patient was allowed awaken from anesthesia and transferred to the PACU in stable condition.  Blood loss: 200cc  Complications: none  Condition: stable   Brittanee Ghazarian C. Randie Heinzain, MD [facility] [place] Office: [phone]  Pager: [phone]

## 2016-02-16 NOTE — Progress Notes (Addendum)
PROGRESS NOTE                                                                                                                                                                                                             Patient Demographics:    Stephen Bowen, is a 60 y.o. male, DOB - 1955-12-07, ONG:[id number]  Admit date - 02/13/2016   Admitting Physician Lorretta HarpXilin Niu, MD  Outpatient Primary MD for the patient is PROVIDER NOT IN SYSTEM  LOS - 3  Chief Complaint  Patient presents with  . Leg Pain       Brief Narrative  60 y.o. male with medical history significant of PAD with claudication, hypertension, tobacco abuse, medication noncompliance, who presents with bilateral leg pain,Seen by vascular surgery, started on heparin drip, Went for an aortogram with bilateral lower extremity runoff, plan for L fem-pt bypass.  Subjective:    Stephen SanesJerome Allred today has, No headache, No chest pain, No abdominal pain , Currently denies any lower extremity pain.  Assessment  & Plan :    Principal Problem:   PAD (peripheral artery disease) (HCC) Active Problems:   Hypertensive urgency   Atherosclerosis of native arteries of extremity with intermittent claudication (HCC)   Tobacco use disorder   Leg pain   Hyperkalemia   Lactic acid increased   Preoperative cardiovascular examination   PAD - pt has advanced peripheral vascular disease with bilateral occlusions that appear compensated.  - Management per vascular surgery , Status post aortogram with bilateral lower extremity runoff 8/23,  patient will need left femoral to TB trunk/PT artery bypass  -  heparin GTT per vascular -  on aspirin and statin medications.  Lactic acid increased - most likely due to limb ischemia. Lactic acid level normalized - treat limb ischemia as above - Continue with IV fluids  Hyperkalemia: -  Potassium 5.8 with T-wave peaking in V3-V4 on EKG on admission. - treated with  D50, 5 unit of Novolog by IV, 2g of calcium chloride, 30 g of Kayexalate - Resolved  Hypertensive urgency: - Blood pressure significantly elevated on admission  at 226/145, which improved to 138/98 after treated with 1 dose of clonidine 0.2 mg, metoprolol 5 mg and labetalol 10 mg x 2 in the ED.  - hold Lisinopril-HCTZ due to hyperkalemia and the need of IV fluid -  resume Verapamil 120 mg daily - IV hydralazine when necessary  Tobacco abuse: -Counseled -Nicotine patch     Code Status : Full  Family Communication  : None at [place]  Disposition Plan  : pending further work up.  Consults  :  Vascular surgery  Procedures  : none  DVT Prophylaxis  :  Heparin GTT  Lab Results  Component Value Date   PLT 304 02/16/2016    Antibiotics  :    Anti-infectives    Start     Dose/Rate Route Frequency Ordered Stop   02/16/16 0600  cefUROXime (ZINACEF) 1.5 g in dextrose 5 % 50 mL IVPB     1.5 g 100 mL/hr over 30 Minutes Intravenous To Short Stay 02/15/16 1507 02/17/16 0600        Objective:   Vitals:   02/15/16 2232 02/16/16 0428 02/16/16 0945 02/16/16 1000  BP: (!) 178/89 (!) 160/95 (!) 197/97 (!) 197/97  Pulse: 83 95  85  Resp: 18 18  18   Temp: 98.5 F (36.9 C) 98.1 F (36.7 C)  97.7 F (36.5 C)  TempSrc: Oral Oral  Oral  SpO2: 99% 99%  99%  Weight:      Height:        Wt Readings from Last 3 Encounters:  02/14/16 57 kg (125 lb 9.6 oz)  08/31/15 61.6 kg (135 lb 12.8 oz)  08/16/15 58.5 kg (129 lb)     Intake/Output Summary (Last 24 hours) at 02/16/16 1030 Last data filed at 02/16/16 0700  Gross per 24 hour  Intake              243 ml  Output              250 ml  Net               -7 ml     Physical Exam( seen before aortogram)  Awake Alert, Oriented X 3,  Supple Neck,No JVD,  Symmetrical Chest wall movement, Good air movement bilaterally, CTAB RRR,No Gallops,Rubs or new Murmurs, No Parasternal Heave +ve B.Sounds, Abd Soft, No tenderness,  No  rebound - guarding or rigidity. No cellulitis, gangrene or ischemic wound, significantly diminished pulses in the right lower extremity, absent in distal left lower extremity    Data Review:    CBC  Recent Labs Lab 02/13/16 1519 02/13/16 1621 02/14/16 0321 02/15/16 0250 02/16/16 0323  WBC 9.6  --  8.2 7.9 8.1  HGB 16.5 17.7* 13.2 12.6* 12.4*  HCT 50.3 52.0 40.4 39.2 38.8*  PLT 334  --  330 279 304  MCV 96.4  --  96.2 96.3 97.0  MCH 31.6  --  31.4 31.0 31.0  MCHC 32.8  --  32.7 32.1 32.0  RDW 13.0  --  12.9 12.9 12.9  LYMPHSABS 1.1  --   --   --   --   MONOABS 0.6  --   --   --   --   EOSABS 0.0  --   --   --   --   BASOSABS 0.0  --   --   --   --     Chemistries   Recent Labs Lab 02/13/16 1610 02/13/16 1621 02/14/16 0321 02/15/16 0250 02/16/16 0323  NA 137 133* 140 134* 135  K 5.1 5.8* 4.1 3.4* 3.7  CL 99* 102 106 103 100*  CO2 24  --  25 25 28   GLUCOSE 79 70 96 91 89  BUN 9  15 9 <5* <5*  CREATININE 0.92 0.90 0.95 0.83 0.83  CALCIUM 9.1  --  8.8* 8.0* 8.4*  AST  --   --  16  --   --   ALT  --   --  19  --   --   ALKPHOS  --   --  69  --   --   BILITOT  --   --  1.5*  --   --    ------------------------------------------------------------------------------------------------------------------  Recent Labs  02/14/16 0321  CHOL 159  HDL 45  LDLCALC 99  TRIG 73  CHOLHDL 3.5    Lab Results  Component Value Date   HGBA1C 5.5 02/14/2016   ------------------------------------------------------------------------------------------------------------------ No results for input(s): TSH, T4TOTAL, T3FREE, THYROIDAB in the last 72 hours.  Invalid input(s): FREET3 ------------------------------------------------------------------------------------------------------------------ No results for input(s): VITAMINB12, FOLATE, FERRITIN, TIBC, IRON, RETICCTPCT in the last 72 hours.  Coagulation profile  Recent Labs Lab 02/13/16 0010  INR 1.10    No results  for input(s): DDIMER in the last 72 hours.  Cardiac Enzymes No results for input(s): CKMB, TROPONINI, MYOGLOBIN in the last 168 hours.  Invalid input(s): CK ------------------------------------------------------------------------------------------------------------------ No results found for: BNP  Inpatient Medications  Scheduled Meds: . aspirin  325 mg Oral Daily  . atorvastatin  40 mg Oral q1800  . cefUROXime (ZINACEF)  IV  1.5 g Intravenous To SSTC  . lisinopril  10 mg Oral Daily   And  . hydrochlorothiazide  12.5 mg Oral Daily  . nicotine  21 mg Transdermal Q24H  . sodium chloride flush  3 mL Intravenous Q12H  . verapamil  120 mg Oral Daily   Continuous Infusions: . sodium chloride 75 mL/hr at 02/14/16 1147  . heparin 900 Units/hr (02/16/16 0426)   PRN Meds:.acetaminophen **OR** acetaminophen, hydrALAZINE, HYDROcodone-acetaminophen, morphine injection  Micro Results Recent Results (from the past 240 hour(s))  Surgical pcr screen     Status: None   Collection Time: 02/15/16  7:51 PM  Result Value Ref Range Status   MRSA, PCR NEGATIVE NEGATIVE Final   Staphylococcus aureus NEGATIVE NEGATIVE Final    Comment:        The Xpert SA Assay (FDA approved for NASAL specimens in patients over 121 years of age), is one component of a comprehensive surveillance program.  Test performance has been validated by [facility] for patients greater than or equal to 60 year old. It is not intended to diagnose infection nor to guide or monitor treatment.     Radiology Reports Ct Angio Ao+bifem W &/or Wo Contrast  Result Date: 02/13/2016 CLINICAL DATA:  Pain aggravated by walking. No rest pain and intact pulses per report. EXAM: CT ANGIOGRAPHY AOBIFEM WITHOUT AND WITH CONTRAST TECHNIQUE: CTA of the abdominal aorta bilateral lower extremities was performed in the arterial phase. Multiplanar reformats were generated. CONTRAST:  100 cc Isovue 370 intravenous COMPARISON:  None. FINDINGS:  Vascular: Aorta: Standard aortic branching. Diffuse atheromatous wall thickening and calcification. No aneurysm or dissection. Visceral branches: No major branch occlusion. The celiac and SMA are without flow limiting stenosis. Moderate right renal ostial stenosis. Patent IMA. Right Leg: Iliacs: Diffuse atheromatous changes. No common or external iliac flow limiting stenosis. Most branches of the hypogastric artery are occluded. Negative for aneurysm. Common femoral: Heavily diseased with atheromatous plaque. No flow limiting stenosis. Superficial femoral artery: Moderate narrowing just beyond the common femoral bifurcation. Occlusion near at the hiatus with well-formed muscular collaterals proximal to the occlusion. Profunda femoris artery: Heavily diseased  and stenotic proximally with first order branch occlusion. The remaining arterial bed is occluded after muscular branches into the medial compartment. Popliteal artery:  Occluded throughout its extent. Below the knee: Collateral flow reconstitutes the anterior tibial artery. The tibioperoneal trunk, tibial, and peroneal arteries are thready proximally but are ramified distally and there is essentially three-vessel runoff that is symmetrically timed to the left. Left leg: Iliacs: Approximately 50% stenosis of the common iliac artery by low-density plaque. Multiple branches of the hypogastric artery are occluded. Common femoral: Heavily diseased with atheromatous plaque. No flow limiting stenosis. Superficial femoral: Occluded from its origin to near the hiatus where there are well-formed collaterals. Profunda femoris: Moderate to advanced stenosis at the origin with diffuse atheromatous narrowing and luminal irregularity, but patent serving multiple muscular branches in the thigh. Popliteal artery: Reconstituted below the hiatus by well-formed collaterals. Heavily diseased throughout its course with multi focal high-grade stenosis. At the level of the knee there  is bulky calcified plaque. Short segment occlusion below the knee. Below the knee: No flow until collaterals weakly reconstitute the anterior tibial artery which is thready at the ankle. Collateral also reconstitutes the posterior tibial which is thready at the ankle. No peroneal reconstitution. Nonvascular: Lower chest and abdominal wall:  No contributory findings. Hepatobiliary: No focal liver abnormality.No evidence of biliary obstruction or stone. Pancreas: Unremarkable. Spleen: Hypervascular mass in the medial spleen measuring 9 mm, nonspecific but often incidental hemangioma. Adrenals/Urinary Tract: Negative adrenals. Symmetric renal enhancement. No hydronephrosis. Excretion of contrast limits detection of urolithiasis. Unremarkable bladder. Stomach/Bowel:  No obstruction. No inflammation. Reproductive:No pathologic findings. Vascular/Lymphatic: No acute vascular abnormality. No mass or adenopathy. Other: No ascites or pneumoperitoneum. Musculoskeletal: No acute abnormalities. Remote left tibia fracture status post ORIF with intra medullary nail. Remote and healed fibular diaphysis fracture on the left. These results were called by telephone at the time of interpretation on 02/13/2016 at ~7:10 pm to Dr. Nelva Nay , who verbally acknowledged these results. Review of the MIP images confirms the above findings. IMPRESSION: 1. Advanced peripheral vascular disease with bilateral occlusions that appear compensated. 2. Right SFA occlusion near the adductor hiatus with infrapopliteal reconstitution and three-vessel runoff. Heavily diseased profunda femoris with early branch occlusions. 3. Early left SFA occlusion with thready discontinuous popliteal and below the knee reconstitution. Single vessel/posterior tibial runoff at the ankle. 4. No flow limiting aortic or iliac stenosis. 5. Moderate right renal ostial stenosis. Electronically Signed   By: Marnee Spring M.D.   On: 02/13/2016 19:21     Naija Troost,  Chantal Worthey M.D on 02/16/2016 at 10:30 AM  Between 7am to 7pm - Pager - [phone]  After 7pm go to [email or web address] - password [facility]  [facility] -  Office  [phone]

## 2016-02-16 NOTE — Progress Notes (Signed)
  Progress Note    02/16/2016 7:47 AM 1 Day Post-Op  Subjective:  Ready for surgery   Vitals:   02/15/16 2232 02/16/16 0428  BP: (!) 178/89 (!) 160/95  Pulse: 83 95  Resp: 18 18  Temp: 98.5 F (36.9 C) 98.1 F (36.7 C)    Physical Exam: rrr Non labored breathing Abdomen soft Groins without hematoma Monophasic pt signal on left  CBC    Component Value Date/Time   WBC 8.1 02/16/2016 0323   RBC 4.00 (L) 02/16/2016 0323   HGB 12.4 (L) 02/16/2016 0323   HCT 38.8 (L) 02/16/2016 0323   PLT 304 02/16/2016 0323   MCV 97.0 02/16/2016 0323   MCH 31.0 02/16/2016 0323   MCHC 32.0 02/16/2016 0323   RDW 12.9 02/16/2016 0323   LYMPHSABS 1.1 02/13/2016 1519   MONOABS 0.6 02/13/2016 1519   EOSABS 0.0 02/13/2016 1519   BASOSABS 0.0 02/13/2016 1519    BMET    Component Value Date/Time   NA 135 02/16/2016 0323   K 3.7 02/16/2016 0323   CL 100 (L) 02/16/2016 0323   CO2 28 02/16/2016 0323   GLUCOSE 89 02/16/2016 0323   BUN <5 (L) 02/16/2016 0323   CREATININE 0.83 02/16/2016 0323   CREATININE 0.92 11/13/2013 1031   CALCIUM 8.4 (L) 02/16/2016 0323   GFRNONAA >60 02/16/2016 0323   GFRNONAA >89 11/13/2013 1031   GFRAA >60 02/16/2016 0323   GFRAA >89 11/13/2013 1031    INR    Component Value Date/Time   INR 1.10 02/13/2016 0010     Intake/Output Summary (Last 24 hours) at 02/16/16 0747 Last data filed at 02/15/16 1800  Gross per 24 hour  Intake              243 ml  Output              250 ml  Net               -7 ml     Assessment:  60 y.o. male is s/p angiogram lle without endo revascularization options. No usable vein for bypass.  Plan: OR today for L fem-pt bypass with graft. Discussed risks and benefits with patient and he agrees to proceed.   Ladarrell Cornwall C. Randie Heinzain, MD [facility] Office: [phone] Pager: [phone]  02/16/2016 7:47 AM

## 2016-02-17 ENCOUNTER — Inpatient Hospital Stay (HOSPITAL_COMMUNITY): Payer: Self-pay

## 2016-02-17 ENCOUNTER — Encounter (HOSPITAL_COMMUNITY): Payer: Self-pay | Admitting: Vascular Surgery

## 2016-02-17 DIAGNOSIS — Z9889 Other specified postprocedural states: Secondary | ICD-10-CM

## 2016-02-17 LAB — CBC
HCT: 30 % — ABNORMAL LOW (ref 39.0–52.0)
HEMOGLOBIN: 9.6 g/dL — AB (ref 13.0–17.0)
MCH: 31.3 pg (ref 26.0–34.0)
MCHC: 32 g/dL (ref 30.0–36.0)
MCV: 97.7 fL (ref 78.0–100.0)
Platelets: 266 10*3/uL (ref 150–400)
RBC: 3.07 MIL/uL — AB (ref 4.22–5.81)
RDW: 13.1 % (ref 11.5–15.5)
WBC: 8.9 10*3/uL (ref 4.0–10.5)

## 2016-02-17 LAB — BASIC METABOLIC PANEL
ANION GAP: 10 (ref 5–15)
BUN: 6 mg/dL (ref 6–20)
CHLORIDE: 98 mmol/L — AB (ref 101–111)
CO2: 26 mmol/L (ref 22–32)
Calcium: 8.2 mg/dL — ABNORMAL LOW (ref 8.9–10.3)
Creatinine, Ser: 0.94 mg/dL (ref 0.61–1.24)
GFR calc non Af Amer: >60 mL/min (ref >60)
Glucose, Bld: 85 mg/dL (ref 65–99)
POTASSIUM: 4.1 mmol/L (ref 3.5–5.1)
SODIUM: 134 mmol/L — AB (ref 135–145)

## 2016-02-17 LAB — HEPARIN LEVEL (UNFRACTIONATED)

## 2016-02-17 LAB — PROTIME-INR
INR: 1.01
PROTHROMBIN TIME: 13.3 s (ref 11.4–15.2)

## 2016-02-17 MED ORDER — COUMADIN BOOK
Freq: Once | Status: AC
  Administered 2016-02-17: 1
  Filled 2016-02-17: qty 1

## 2016-02-17 MED ORDER — ENOXAPARIN SODIUM 100 MG/ML ~~LOC~~ SOLN
85.0000 mg | SUBCUTANEOUS | Status: DC
  Administered 2016-02-17 – 2016-02-19 (×3): 85 mg via SUBCUTANEOUS
  Filled 2016-02-17 (×3): qty 1

## 2016-02-17 MED ORDER — WARFARIN - PHARMACIST DOSING INPATIENT: Freq: Every day | Status: DC

## 2016-02-17 MED ORDER — WARFARIN VIDEO
Freq: Once | Status: AC
  Administered 2016-02-17: 1

## 2016-02-17 MED ORDER — WARFARIN SODIUM 7.5 MG PO TABS
7.5000 mg | ORAL_TABLET | Freq: Once | ORAL | Status: AC
Start: 2016-02-17 — End: 2016-02-17
  Administered 2016-02-17: 7.5 mg via ORAL
  Filled 2016-02-17: qty 1

## 2016-02-17 NOTE — Progress Notes (Signed)
ANTICOAGULATION CONSULT NOTE  Pharmacy Consult for lovenox/warfarin Indication: PAD s/p vascular procedure  No Known Allergies  Patient Measurements: Height: 5\' 8"  (172.7 cm) Weight: 125 lb 9.6 oz (57 kg) IBW/kg (Calculated) : 68.4  Vital Signs: Temp: 98.1 F (36.7 C) (08/25 0800) Temp Source: Oral (08/25 0800) BP: 125/71 (08/25 0700) Pulse Rate: 95 (08/25 0700)  Labs:  Recent Labs  02/15/16 0249  02/15/16 0250 02/16/16 0323 02/17/16 0406  HGB  --   < > 12.6* 12.4* 9.6*  HCT  --   --  39.2 38.8* 30.0*  PLT  --   --  279 304 266  HEPARINUNFRC 0.45  --   --  0.38 <0.10*  CREATININE  --   --  0.83 0.83 0.94  < > = values in this interval not displayed.  Estimated Creatinine Clearance: 67.4 mL/min (by C-G formula based on SCr of 0.94 mg/dL).   Assessment: 60 y/o M with arterial occlusion, s/p endovascular intervention. Started on low-dose heparin per MD post-op (heparin level undetectable), now Pharmacy consulted to transition to lovenox+warfarin. No anticoagulation pta. Most recent INR 1.1 on 8/21. Hg low stable, plt wnl, no bleed issues noted per RN. Wt 57kg, CrCl~67.  Goal of Therapy:  INR 2-3 Monitor platelets by anticoagulation protocol: Yes   Plan:  Turn off heparin drip; start lovenox 85mg  (~1.5mg /kg) Platter q24h - communicated plan with RN Warfarin 7.5mg  x 1 dose tonight Daily INR Monitor s/sx bleeding  Babs BertinHaley Dyanara Cozza, PharmD, BCPS Clinical Pharmacist Pager [phone] 02/17/2016 11:10 AM

## 2016-02-17 NOTE — Discharge Instructions (Addendum)
You are being discharged on Warfarin. You should take 5mg  orally on 8/28 and then have your INR (lab) checked on 8/29, and your Coumadin dose will be adjusted by your doctor. You should continue to take Lovenox injections until your doctor tells you to stop.  Information on my medicine - Coumadin   (Warfarin)  This medication education was reviewed with me or my healthcare representative as part of my discharge preparation.   Why was Coumadin prescribed for you? Coumadin was prescribed for you because you have a blood clot or a medical condition that can cause an increased risk of forming blood clots. Blood clots can cause serious health problems by blocking the flow of blood to the heart, lung, or brain. Coumadin can prevent harmful blood clots from forming. As a reminder your indication for Coumadin is:   Peripheral arterial disease  What test will check on my response to Coumadin? While on Coumadin (warfarin) you will need to have an INR test regularly to ensure that your dose is keeping you in the desired range. The INR (international normalized ratio) number is calculated from the result of the laboratory test called prothrombin time (PT).  If an INR APPOINTMENT HAS NOT ALREADY BEEN MADE FOR YOU please schedule an appointment to have this lab work done by your health care provider within 7 days. Your INR goal is usually a number between:  2 to 3 or your provider may give you a more narrow range like 2-2.5.  Ask your health care provider during an office visit what your goal INR is.  What  do you need to  know  About  COUMADIN? Take Coumadin (warfarin) exactly as prescribed by your healthcare provider about the same time each day.  DO NOT stop taking without talking to the doctor who prescribed the medication.  Stopping without other blood clot prevention medication to take the place of Coumadin may increase your risk of developing a new clot or stroke.  Get refills before you run out.  What  do you do if you miss a dose? If you miss a dose, take it as soon as you remember on the same day then continue your regularly scheduled regimen the next day.  Do not take two doses of Coumadin at the same time.  Important Safety Information A possible side effect of Coumadin (Warfarin) is an increased risk of bleeding. You should call your healthcare provider right away if you experience any of the following: ? Bleeding from an injury or your nose that does not stop. ? Unusual colored urine (red or dark brown) or unusual colored stools (red or black). ? Unusual bruising for unknown reasons. ? A serious fall or if you hit your head (even if there is no bleeding).  Some foods or medicines interact with Coumadin (warfarin) and might alter your response to warfarin. To help avoid this: ? Eat a balanced diet, maintaining a consistent amount of Vitamin K. ? Notify your provider about major diet changes you plan to make. ? Avoid alcohol or limit your intake to 1 drink for women and 2 drinks for men per day. (1 drink is 5 oz. wine, 12 oz. beer, or 1.5 oz. liquor.)  Make sure that ANY health care provider who prescribes medication for you knows that you are taking Coumadin (warfarin).  Also make sure the healthcare provider who is monitoring your Coumadin knows when you have started a new medication including herbals and non-prescription products.  Coumadin (Warfarin)  Major  Drug Interactions  Increased Warfarin Effect Decreased Warfarin Effect  Alcohol (large quantities) Antibiotics (esp. Septra/Bactrim, Flagyl, Cipro) Amiodarone (Cordarone) Aspirin (ASA) Cimetidine (Tagamet) Megestrol (Megace) NSAIDs (ibuprofen, naproxen, etc.) Piroxicam (Feldene) Propafenone (Rythmol SR) Propranolol (Inderal) Isoniazid (INH) Posaconazole (Noxafil) Barbiturates (Phenobarbital) Carbamazepine (Tegretol) Chlordiazepoxide (Librium) Cholestyramine (Questran) Griseofulvin Oral  Contraceptives Rifampin Sucralfate (Carafate) Vitamin K   Coumadin (Warfarin) Major Herbal Interactions  Increased Warfarin Effect Decreased Warfarin Effect  Garlic Ginseng Ginkgo biloba Coenzyme Q10 Green tea St. Johns wort    Coumadin (Warfarin) FOOD Interactions  Eat a consistent number of servings per week of foods HIGH in Vitamin K (1 serving =  cup)  Collards (cooked, or boiled & drained) Kale (cooked, or boiled & drained) Mustard greens (cooked, or boiled & drained) Parsley *serving size only =  cup Spinach (cooked, or boiled & drained) Swiss chard (cooked, or boiled & drained) Turnip greens (cooked, or boiled & drained)  Eat a consistent number of servings per week of foods MEDIUM-HIGH in Vitamin K (1 serving = 1 cup)  Asparagus (cooked, or boiled & drained) Broccoli (cooked, boiled & drained, or raw & chopped) Brussel sprouts (cooked, or boiled & drained) *serving size only =  cup Lettuce, raw (green leaf, endive, romaine) Spinach, raw Turnip greens, raw & chopped   These websites have more information on Coumadin (warfarin):  FailFactory.se; VeganReport.com.au;

## 2016-02-17 NOTE — Progress Notes (Signed)
PROGRESS NOTE                                                                                                                                                                                                             Patient Demographics:    Stephen Bowen, is a 60 y.o. male, DOB - 02-08-1956, WUJ:[id number]  Admit date - 02/13/2016   Admitting Physician Lorretta HarpXilin Niu, MD  Outpatient Primary MD for the patient is PROVIDER NOT IN SYSTEM  LOS - 4  Chief Complaint  Patient presents with  . Leg Pain       Brief Narrative  60 y.o. male with medical history significant of PAD with claudication, hypertension, tobacco abuse, medication noncompliance, who presents with bilateral leg pain,Seen by vascular surgery, started on heparin drip, He has undergone angiogram and has significant arterial disease in his left leg with dominant PT runoff to the foot, So he went for left femoropopliteal bypass on 8/24.   Subjective:    Stephen Bowen today has, No headache, No chest pain, No abdominal pain , Complains  of left lower extremity pain.  Assessment  & Plan :    Principal Problem:   PAD (peripheral artery disease) (HCC) Active Problems:   Hypertensive urgency   Atherosclerosis of native arteries of extremity with intermittent claudication (HCC)   Tobacco use disorder   Leg pain   Hyperkalemia   Lactic acid increased   Preoperative cardiovascular examination   PAD/Critical limb ischemia - pt has advanced peripheral vascular disease with bilateral occlusions that appear compensated.  - Management per vascular surgery , Status post aortogram with bilateral lower extremity runoff 8/23, with evidence of significant arterial disease and left leg. - Status post Left femoral endarterectomy with patch angioplasty with bovine pericardium,  Left femoral to PT artery bypass with 6 mm propaten graft and vein patch angioplasty 8/25 - Heparin GTT, transition to warfarin  per vascular surgery recommendation -  on aspirin and statin medications.  Lactic acid increased - most likely due to limb ischemia. Lactic acid level normalized - treat limb ischemia as above - Continue with IV fluids  Hyperkalemia: -  Potassium 5.8 with T-wave peaking in V3-V4 on EKG on admission. - treated with D50, 5 unit of Novolog by IV, 2g of calcium chloride, 30 g of Kayexalate - Resolved  Hypertensive urgency: -  Blood pressure significantly elevated on admission  at 226/145 - Currently acceptable on home regimen including verapamil, lisinopril and hydrochlorothiazide - IV hydralazine when necessary  Tobacco abuse: -Counseled -Nicotine patch     Code Status : Full  Family Communication  : None at ebdside  Disposition Plan  : pending further work up.  Consults  :  Vascular surgery  Procedures  :  - Aortogram with bilateral lower extremity runoff 8/23 - Status post Left femoral endarterectomy with patch angioplasty with bovine pericardium,  Left femoral to PT artery bypass with 6 mm propaten graft and vein patch angioplasty 8/25  DVT Prophylaxis  :  Heparin GTT>Warfarin  Lab Results  Component Value Date   PLT 266 02/17/2016    Antibiotics  :    Anti-infectives    Start     Dose/Rate Route Frequency Ordered Stop   02/17/16 0000  cefUROXime (ZINACEF) 1.5 g in dextrose 5 % 50 mL IVPB     1.5 g 100 mL/hr over 30 Minutes Intravenous Every 12 hours 02/16/16 1704 02/17/16 2359   02/16/16 0600  cefUROXime (ZINACEF) 1.5 g in dextrose 5 % 50 mL IVPB     1.5 g 100 mL/hr over 30 Minutes Intravenous To Short Stay 02/15/16 1507 02/16/16 1226        Objective:   Vitals:   02/17/16 0000 02/17/16 0345 02/17/16 0700 02/17/16 0800  BP:  138/75 125/71   Pulse:  100 95   Resp:  10 12   Temp: 98.2 F (36.8 C) 98 F (36.7 C)  98.1 F (36.7 C)  TempSrc: Oral Oral  Oral  SpO2:  98% 97%   Weight:      Height:        Wt Readings from Last 3 Encounters:    02/14/16 57 kg (125 lb 9.6 oz)  08/31/15 61.6 kg (135 lb 12.8 oz)  08/16/15 58.5 kg (129 lb)     Intake/Output Summary (Last 24 hours) at 02/17/16 1047 Last data filed at 02/17/16 0500  Gross per 24 hour  Intake          3971.25 ml  Output             1700 ml  Net          2271.25 ml     Physical Exam( seen before aortogram)  Awake Alert, Oriented X 3,  Supple Neck,No JVD,  Symmetrical Chest wall movement, Good air movement bilaterally, CTAB RRR,No Gallops,Rubs or new Murmurs, No Parasternal Heave +ve B.Sounds, Abd Soft, No tenderness,  No rebound - guarding or rigidity. No cellulitis, gangrene or ischemic wound,left leg surgical wound in the groin area and medial tibial area   Data Review:    CBC  Recent Labs Lab 02/13/16 1519 02/13/16 1621 02/14/16 0321 02/15/16 0250 02/16/16 0323 02/17/16 0406  WBC 9.6  --  8.2 7.9 8.1 8.9  HGB 16.5 17.7* 13.2 12.6* 12.4* 9.6*  HCT 50.3 52.0 40.4 39.2 38.8* 30.0*  PLT 334  --  330 279 304 266  MCV 96.4  --  96.2 96.3 97.0 97.7  MCH 31.6  --  31.4 31.0 31.0 31.3  MCHC 32.8  --  32.7 32.1 32.0 32.0  RDW 13.0  --  12.9 12.9 12.9 13.1  LYMPHSABS 1.1  --   --   --   --   --   MONOABS 0.6  --   --   --   --   --   EOSABS 0.0  --   --   --   --   --  BASOSABS 0.0  --   --   --   --   --     Chemistries   Recent Labs Lab 02/13/16 1610 02/13/16 1621 02/14/16 0321 02/15/16 0250 02/16/16 0323 02/17/16 0406  NA 137 133* 140 134* 135 134*  K 5.1 5.8* 4.1 3.4* 3.7 4.1  CL 99* 102 106 103 100* 98*  CO2 24  --  25 25 28 26   GLUCOSE 79 70 96 91 89 85  BUN 9 15 9  <5* <5* 6  CREATININE 0.92 0.90 0.95 0.83 0.83 0.94  CALCIUM 9.1  --  8.8* 8.0* 8.4* 8.2*  AST  --   --  16  --   --   --   ALT  --   --  19  --   --   --   ALKPHOS  --   --  69  --   --   --   BILITOT  --   --  1.5*  --   --   --    ------------------------------------------------------------------------------------------------------------------ No results for  input(s): CHOL, HDL, LDLCALC, TRIG, CHOLHDL, LDLDIRECT in the last 72 hours.  Lab Results  Component Value Date   HGBA1C 5.5 02/14/2016   ------------------------------------------------------------------------------------------------------------------ No results for input(s): TSH, T4TOTAL, T3FREE, THYROIDAB in the last 72 hours.  Invalid input(s): FREET3 ------------------------------------------------------------------------------------------------------------------ No results for input(s): VITAMINB12, FOLATE, FERRITIN, TIBC, IRON, RETICCTPCT in the last 72 hours.  Coagulation profile  Recent Labs Lab 02/13/16 0010  INR 1.10    No results for input(s): DDIMER in the last 72 hours.  Cardiac Enzymes No results for input(s): CKMB, TROPONINI, MYOGLOBIN in the last 168 hours.  Invalid input(s): CK ------------------------------------------------------------------------------------------------------------------ No results found for: BNP  Inpatient Medications  Scheduled Meds: . aspirin  325 mg Oral Daily  . atorvastatin  40 mg Oral q1800  . cefUROXime (ZINACEF)  IV  1.5 g Intravenous Q12H  . docusate sodium  100 mg Oral Daily  . lisinopril  10 mg Oral Daily   And  . hydrochlorothiazide  12.5 mg Oral Daily  . nicotine  21 mg Transdermal Q24H  . pantoprazole  40 mg Oral Daily  . sodium chloride flush  3 mL Intravenous Q12H  . verapamil  120 mg Oral Daily   Continuous Infusions: . sodium chloride 100 mL/hr at 02/17/16 0445  . heparin 500 Units/hr (02/17/16 0000)  . lactated ringers 50 mL/hr at 02/16/16 1130   PRN Meds:.sodium chloride, acetaminophen **OR** acetaminophen, alum & mag hydroxide-simeth, bisacodyl, guaiFENesin-dextromethorphan, hydrALAZINE, HYDROcodone-acetaminophen, labetalol, magnesium sulfate 1 - 4 g bolus IVPB, metoprolol, morphine injection, ondansetron, phenol, potassium chloride, senna-docusate  Micro Results Recent Results (from the past 240 hour(s))    Surgical pcr screen     Status: None   Collection Time: 02/15/16  7:51 PM  Result Value Ref Range Status   MRSA, PCR NEGATIVE NEGATIVE Final   Staphylococcus aureus NEGATIVE NEGATIVE Final    Comment:        The Xpert SA Assay (FDA approved for NASAL specimens in patients over 43 years of age), is one component of a comprehensive surveillance program.  Test performance has been validated by [facility] for patients greater than or equal to 47 year old. It is not intended to diagnose infection nor to guide or monitor treatment.     Radiology Reports Ct Angio Ao+bifem W &/or Wo Contrast  Result Date: 02/13/2016 CLINICAL DATA:  Pain aggravated by walking. No rest pain and intact pulses per report. EXAM:  CT ANGIOGRAPHY AOBIFEM WITHOUT AND WITH CONTRAST TECHNIQUE: CTA of the abdominal aorta bilateral lower extremities was performed in the arterial phase. Multiplanar reformats were generated. CONTRAST:  100 cc Isovue 370 intravenous COMPARISON:  None. FINDINGS: Vascular: Aorta: Standard aortic branching. Diffuse atheromatous wall thickening and calcification. No aneurysm or dissection. Visceral branches: No major branch occlusion. The celiac and SMA are without flow limiting stenosis. Moderate right renal ostial stenosis. Patent IMA. Right Leg: Iliacs: Diffuse atheromatous changes. No common or external iliac flow limiting stenosis. Most branches of the hypogastric artery are occluded. Negative for aneurysm. Common femoral: Heavily diseased with atheromatous plaque. No flow limiting stenosis. Superficial femoral artery: Moderate narrowing just beyond the common femoral bifurcation. Occlusion near at the hiatus with well-formed muscular collaterals proximal to the occlusion. Profunda femoris artery: Heavily diseased and stenotic proximally with first order branch occlusion. The remaining arterial bed is occluded after muscular branches into the medial compartment. Popliteal artery:  Occluded  throughout its extent. Below the knee: Collateral flow reconstitutes the anterior tibial artery. The tibioperoneal trunk, tibial, and peroneal arteries are thready proximally but are ramified distally and there is essentially three-vessel runoff that is symmetrically timed to the left. Left leg: Iliacs: Approximately 50% stenosis of the common iliac artery by low-density plaque. Multiple branches of the hypogastric artery are occluded. Common femoral: Heavily diseased with atheromatous plaque. No flow limiting stenosis. Superficial femoral: Occluded from its origin to near the hiatus where there are well-formed collaterals. Profunda femoris: Moderate to advanced stenosis at the origin with diffuse atheromatous narrowing and luminal irregularity, but patent serving multiple muscular branches in the thigh. Popliteal artery: Reconstituted below the hiatus by well-formed collaterals. Heavily diseased throughout its course with multi focal high-grade stenosis. At the level of the knee there is bulky calcified plaque. Short segment occlusion below the knee. Below the knee: No flow until collaterals weakly reconstitute the anterior tibial artery which is thready at the ankle. Collateral also reconstitutes the posterior tibial which is thready at the ankle. No peroneal reconstitution. Nonvascular: Lower chest and abdominal wall:  No contributory findings. Hepatobiliary: No focal liver abnormality.No evidence of biliary obstruction or stone. Pancreas: Unremarkable. Spleen: Hypervascular mass in the medial spleen measuring 9 mm, nonspecific but often incidental hemangioma. Adrenals/Urinary Tract: Negative adrenals. Symmetric renal enhancement. No hydronephrosis. Excretion of contrast limits detection of urolithiasis. Unremarkable bladder. Stomach/Bowel:  No obstruction. No inflammation. Reproductive:No pathologic findings. Vascular/Lymphatic: No acute vascular abnormality. No mass or adenopathy. Other: No ascites or  pneumoperitoneum. Musculoskeletal: No acute abnormalities. Remote left tibia fracture status post ORIF with intra medullary nail. Remote and healed fibular diaphysis fracture on the left. These results were called by telephone at the time of interpretation on 02/13/2016 at ~7:10 pm to Dr. Nelva Nay , who verbally acknowledged these results. Review of the MIP images confirms the above findings. IMPRESSION: 1. Advanced peripheral vascular disease with bilateral occlusions that appear compensated. 2. Right SFA occlusion near the adductor hiatus with infrapopliteal reconstitution and three-vessel runoff. Heavily diseased profunda femoris with early branch occlusions. 3. Early left SFA occlusion with thready discontinuous popliteal and below the knee reconstitution. Single vessel/posterior tibial runoff at the ankle. 4. No flow limiting aortic or iliac stenosis. 5. Moderate right renal ostial stenosis. Electronically Signed   By: Marnee Spring M.D.   On: 02/13/2016 19:21     ELGERGAWY, DAWOOD M.D on 02/17/2016 at 10:47 AM  Between 7am to 7pm - Pager - [phone]  After 7pm go to [email or web address] - password [facility]  [facility] -  Office  [phone]

## 2016-02-17 NOTE — Progress Notes (Addendum)
[facility] [place]  Subjective  - Doing well no new complaints.   Objective 138/75 100 98 F (36.7 C) (Oral) 10 98%  Intake/Output Summary (Last 24 hours) at 02/17/16 [id number] Last data filed at 02/17/16 0500  Gross per 24 hour  Intake          3971.25 ml  Output             1700 ml  Net          2271.25 ml   Palpable PT doppler signals DP/Peroneal Groin soft without hematoma Heart tachy 109 bpm Lungs non labored breathing   Assessment/Planning: POD # 1 Left Fem PT by pass   On Heparin 500/ hr plan to transition to coumadin when ok per Dr. Randie Heinzain Transfer to [facility] per vascular service Disposition stable  Clinton GallantCOLLINS, EMMA [facility] 02/17/2016 7:18 AM --  Laboratory Lab Results:  Recent Labs  02/15/16 0250 02/16/16 0323  WBC 7.9 8.1  HGB 12.6* 12.4*  HCT 39.2 38.8*  PLT 279 304   BMET  Recent Labs  02/16/16 0323 02/17/16 0406  NA 135 134*  K 3.7 4.1  CL 100* 98*  CO2 28 26  GLUCOSE 89 85  BUN <5* 6  CREATININE 0.83 0.94  CALCIUM 8.4* 8.2*    COAG Lab Results  Component Value Date   INR 1.10 02/13/2016   No results found for: PTT   As above, ok to transfer to 2W and transition to oral anticoagulation.  Lavene Penagos C. Randie Heinzain, MD [facility] [place] Office: [phone] Pager: [phone]

## 2016-02-17 NOTE — Evaluation (Signed)
Physical Therapy Evaluation Patient Details Name: Stephen Bowen MRN: [id number] DOB: 8/Stephen Apley18/1957 Today's Date: 02/17/2016   History of Present Illness  Stephen ApleyJerome D Bowen is a 60 y.o. male with medical history significant of PAD with claudication, hypertension, L tibial fx surgery, tobacco abuse, medication noncompliance, who presents with bilateral leg pain. Underwent femoral endarterectomy and L fem to PT artery bypass 02/16/16.  Clinical Impression  Pt was able to mobilize safely with RW a short distance in room.  HEP program given for left LE gentle ROM exercises.  Pt would benefit from acute PT, but will likely not need therapy at discharge.  PT to follow acutely for deficits listed below.       Follow Up Recommendations No PT follow up    Equipment Recommendations  None recommended by PT    Recommendations for Other Services   NA    Precautions / Restrictions Precautions Precautions: Fall Restrictions LLE Weight Bearing: Weight bearing as tolerated      Mobility  Bed Mobility Overal bed mobility: Modified Independent                Transfers Overall transfer level: Needs assistance Equipment used: Rolling walker (2 wheeled) Transfers: Sit to/from Stand Sit to Stand: Supervision         General transfer comment: Supervision for safety and line management.   Ambulation/Gait Ambulation/Gait assistance: Supervision Ambulation Distance (Feet): 8 Feet Assistive device: Rolling walker (2 wheeled) Gait Pattern/deviations: Step-through pattern;Antalgic     General Gait Details: Pt with moderately antalgic gait pattern, steady and safe with RW use, however, declining gait into the hallway secondary to left leg pain.  I encouraged him that walking may actually help with his pain and stiffness.           Balance Overall balance assessment: Needs assistance Sitting-balance support: Feet supported;No upper extremity supported Sitting balance-Leahy Scale: Good      Standing balance support: Bilateral upper extremity supported;Single extremity supported;No upper extremity supported Standing balance-Leahy Scale: Fair                               Pertinent Vitals/Pain Pain Assessment: 0-10 Pain Score: 8  Pain Location: left leg "tight" Pain Descriptors / Indicators: Burning Pain Intervention(s): Limited activity within patient's tolerance;Monitored during session;Repositioned    Home Living Family/patient expects to be discharged to:: Private residence Living Arrangements: Other relatives Available Help at Discharge: Family Type of Home: House Home Access: Stairs to enter Entrance Stairs-Rails: None Entrance Stairs-Number of Steps: 1 Home Layout: One level Home Equipment: Environmental consultantWalker - 2 wheels;Cane - single point;Shower seat Additional Comments: ?equpimetn belongs to family    Prior Function Level of Independence: Independent         Comments: did not use an assistive device for gait.      Hand Dominance   Dominant Hand: Right    Extremity/Trunk Assessment   Upper Extremity Assessment: Defer to OT evaluation           Lower Extremity Assessment: LLE deficits/detail   LLE Deficits / Details: left leg with normal post op pain and weakness, pt can actually now feel his left now more than his right leg.  He has 3-/5 ankle, 2+/5 knee and hip strength  Cervical / Trunk Assessment: Normal  Communication   Communication: No difficulties  Cognition Arousal/Alertness: Awake/alert Behavior During Therapy: WFL for tasks assessed/performed Overall Cognitive Status: Within Functional Limits for tasks assessed  Exercises General Exercises - Lower Extremity Ankle Circles/Pumps: AROM;Both;20 reps Quad Sets: AROM;Left;10 reps Heel Slides: AAROM;Left;10 reps Hip ABduction/ADduction: AAROM;Left;10 reps Straight Leg Raises: AAROM;Left;10 reps      Assessment/Plan    PT Assessment  Patient needs continued PT services  PT Diagnosis Difficulty walking;Abnormality of gait;Generalized weakness;Acute pain   PT Problem List Decreased strength;Decreased range of motion;Decreased activity tolerance;Decreased balance;Decreased mobility;Decreased knowledge of use of DME  PT Treatment Interventions DME instruction;Gait training;Stair training;Functional mobility training;Therapeutic activities;Therapeutic exercise;Balance training;Patient/family education   PT Goals (Current goals can be found in the Care Plan section) Acute Rehab PT Goals Patient Stated Goal: decrease pain PT Goal Formulation: With patient Time For Goal Achievement: 03/02/16 Potential to Achieve Goals: Good    Frequency Min 3X/week           End of Session   Activity Tolerance: Patient limited by pain Patient left: in chair;with call bell/phone within reach Nurse Communication: Mobility status         Time: 1215-1239 PT Time Calculation (min) (ACUTE ONLY): 24 min   Charges:   PT Evaluation $PT Eval Moderate Complexity: 1 Procedure PT Treatments $Therapeutic Exercise: 8-22 mins        Quran Vasco B. Brigitte Soderberg, PT, DPT [phone]   02/17/2016, 2:21 PM

## 2016-02-17 NOTE — Progress Notes (Signed)
Occupational Therapy Evaluation Patient Details Name: OSA CAMPOLI MRN: [id number] DOB: [birth year] Today's Date: 02/17/2016    History of Present Illness Stephen Bowen is a 60 y.o. male with medical history significant of PAD with claudication, hypertension, tobacco abuse, medication noncompliance, who presents with bilateral leg pain. Underwent femoral endarterectomy and L fem to PT artery bypass 02/16/16.   Clinical Impression   PTA, pt independent with ADL and mobility. Pt limited by pain LLE, but making good progress. Pt will be appropriate to D/C home with intermittent S when medically stable. Will follow acutely to facilitate safe D/C home.     Follow Up Recommendations  No OT follow up;Supervision - Intermittent    Equipment Recommendations  None recommended by OT    Recommendations for Other Services       Precautions / Restrictions Precautions Precautions: Fall Restrictions LLE Weight Bearing: Weight bearing as tolerated      Mobility Bed Mobility Overal bed mobility: Modified Independent                Transfers Overall transfer level: Needs assistance Equipment used: Rolling walker (2 wheeled) Transfers: Sit to/from Stand Sit to Stand: Supervision              Balance Overall balance assessment: Needs assistance   Sitting balance-Leahy Scale: Good       Standing balance-Leahy Scale: Fair                              ADL Overall ADL's : Needs assistance/impaired         Upper Body Bathing: Set up   Lower Body Bathing: Minimal assistance;Sit to/from stand   Upper Body Dressing : Set up;Sitting   Lower Body Dressing: Minimal assistance;Sit to/from stand Lower Body Dressing Details (indicate cue type and reason): Assist to donnn sock L foot Toilet Transfer: Supervision/safety;RW   Toileting- Clothing Manipulation and Hygiene: Supervision/safety       Functional mobility during ADLs: Supervision/safety;Rolling  walker General ADL Comments: Pt initially walking without putting L foot on floor, but able to ambulate with L foot on floor toward end of session. Pt stated " I just had to stretch it out". Used RW but most likely able to ambulate without RW as pain improves.      Vision     Perception     Praxis      Pertinent Vitals/Pain Pain Assessment: 0-10 Pain Score: 6  Pain Location: LLE Pain Descriptors / Indicators: Burning;Discomfort;Tightness Pain Intervention(s): Limited activity within patient's tolerance;Repositioned     Hand Dominance Right   Extremity/Trunk Assessment Upper Extremity Assessment Upper Extremity Assessment: Overall WFL for tasks assessed   Lower Extremity Assessment Lower Extremity Assessment: LLE deficits/detail LLE Deficits / Details: associated with fem pop bypass LLE Sensation:  (burning)   Cervical / Trunk Assessment Cervical / Trunk Assessment: Normal   Communication Communication Communication: No difficulties   Cognition Arousal/Alertness: Awake/alert Behavior During Therapy: WFL for tasks assessed/performed Overall Cognitive Status: Within Functional Limits for tasks assessed                     General Comments       Exercises       Shoulder Instructions      Home Living Family/patient expects to be discharged to:: Private residence Living Arrangements: Other relatives Available Help at Discharge: Family Type of Home: House Home Access: Stairs to enter Secretary/administrator  of Steps: 1 Entrance Stairs-Rails: None Home Layout: One level     Bathroom Shower/Tub: Tub/shower unit;Curtain Shower/tub characteristics: Engineer, building servicesCurtain Bathroom Toilet: Standard Bathroom Accessibility: Yes How Accessible: Accessible via walker Home Equipment: Walker - 2 wheels;Cane - single point;Shower seat   Additional Comments: ?equpimetn belongs to family      Prior Functioning/Environment Level of Independence: Independent              OT Diagnosis: Generalized weakness;Acute pain   OT Problem List: Decreased strength;Decreased range of motion;Decreased activity tolerance;Impaired balance (sitting and/or standing);Decreased knowledge of use of DME or AE;Cardiopulmonary status limiting activity;Impaired sensation;Pain   OT Treatment/Interventions: Self-care/ADL training;DME and/or AE instruction;Therapeutic activities;Patient/family education    OT Goals(Current goals can be found in the care plan section) Acute Rehab OT Goals Patient Stated Goal: to get better OT Goal Formulation: With patient  OT Frequency: Min 2X/week   Barriers to D/C:            Co-evaluation              End of Session Equipment Utilized During Treatment: Gait belt;Rolling walker Nurse Communication: Mobility status  Activity Tolerance: Patient tolerated treatment well Patient left: in chair;with call bell/phone within reach   Time: [id number] OT Time Calculation (min): 23 min Charges:  OT General Charges $OT Visit: 1 Procedure OT Evaluation $OT Eval Moderate Complexity: 1 Procedure G-Codes:    Maha Fischel,HILLARY 02/17/2016, 11:40 AM   Luisa DagoHilary Morissa Obeirne, OT/L  [phone] 02/17/2016

## 2016-02-17 NOTE — Care Management Note (Signed)
Case Management Note  Patient Details  Name: Stephen Bowen MRN: [id number] Date of Birth: 17-Feb-1956  Subjective/Objective:    Patient is s/p fem pop, he lives with his uncle, pta indep.  He states he has transportation at discharge.  NCM made hospital follow up apt at [facility] clinic on 10/11 (taking over flow for [facility]), if he is given any new medications he will  need medication ast with Match letter  if dc over the weekend.  [facility] he conts on same meds he was previously on. NCM gave him brochure for sickle cell clinic and [facility] clinic.  NCM will cont to follow for dc needs.                Action/Plan:   Expected Discharge Date:                  Expected Discharge Plan:  Home/Self Care  In-House Referral:     Discharge planning Services  CM Consult, Follow-up appt scheduled, Medication Assistance  Post Acute Care Choice:    Choice offered to:     DME Arranged:    DME Agency:     HH Arranged:    HH Agency:     Status of Service:  In process, will continue to follow  If discussed at Long Length of Stay Meetings, dates discussed:    Additional Comments:  Leone Havenaylor, Sophia Sperry Clinton, RN 02/17/2016, 11:40 AM

## 2016-02-17 NOTE — Progress Notes (Signed)
VASCULAR LAB PRELIMINARY  ARTERIAL  ABI completed: The right ABI of 0.4 is indicative of severe arterial occlusive disease with an abnormal TBI of 0.4 at rest. The left ABI of 1.05 is indicates normal flow with an abnormal TBI of 0.3 at rest.    RIGHT    LEFT    PRESSURE WAVEFORM  PRESSURE WAVEFORM  BRACHIAL 151 Triphasic BRACHIAL 130 Triphasic  DP 60 Monophasic DP 153 Monophasic  PT 56 Monophasic PT 158 Monophasic  GREAT TOE 18 NA GREAT TOE 45 NA    RIGHT LEFT  ABI 0.4 1.05     Elsie StainGregory J Tahje Borawski, RVT 02/17/2016, 2:45 PM

## 2016-02-18 LAB — BASIC METABOLIC PANEL
ANION GAP: 6 (ref 5–15)
BUN: <5 mg/dL — ABNORMAL LOW (ref 6–20)
CO2: 32 mmol/L (ref 22–32)
Calcium: 8.4 mg/dL — ABNORMAL LOW (ref 8.9–10.3)
Chloride: 99 mmol/L — ABNORMAL LOW (ref 101–111)
Creatinine, Ser: 1.08 mg/dL (ref 0.61–1.24)
GFR calc non Af Amer: >60 mL/min (ref >60)
Glucose, Bld: 115 mg/dL — ABNORMAL HIGH (ref 65–99)
Potassium: 3.6 mmol/L (ref 3.5–5.1)
SODIUM: 137 mmol/L (ref 135–145)

## 2016-02-18 LAB — CBC
HEMATOCRIT: 28.5 % — AB (ref 39.0–52.0)
Hemoglobin: 9.2 g/dL — ABNORMAL LOW (ref 13.0–17.0)
MCH: 31.5 pg (ref 26.0–34.0)
MCHC: 32.3 g/dL (ref 30.0–36.0)
MCV: 97.6 fL (ref 78.0–100.0)
PLATELETS: 288 10*3/uL (ref 150–400)
RBC: 2.92 MIL/uL — ABNORMAL LOW (ref 4.22–5.81)
RDW: 13 % (ref 11.5–15.5)
WBC: 8.8 10*3/uL (ref 4.0–10.5)

## 2016-02-18 LAB — PROTIME-INR
INR: 1.23
Prothrombin Time: 15.6 seconds — ABNORMAL HIGH (ref 11.4–15.2)

## 2016-02-18 MED ORDER — ENOXAPARIN SODIUM 100 MG/ML ~~LOC~~ SOLN: 85.0000 mg | SUBCUTANEOUS | 1 refills | Status: DC

## 2016-02-18 MED ORDER — ASPIRIN 325 MG PO TABS: 325.0000 mg | ORAL_TABLET | Freq: Every day | ORAL | 0 refills | Status: DC

## 2016-02-18 MED ORDER — HYDROCODONE-ACETAMINOPHEN 5-325 MG PO TABS: 1.0000 | ORAL_TABLET | ORAL | 0 refills | Status: DC | PRN

## 2016-02-18 MED ORDER — PANTOPRAZOLE SODIUM 40 MG PO TBEC: 40.0000 mg | DELAYED_RELEASE_TABLET | Freq: Every day | ORAL | 0 refills | Status: DC

## 2016-02-18 MED ORDER — BISACODYL 5 MG PO TBEC
5.0000 mg | DELAYED_RELEASE_TABLET | Freq: Every day | ORAL | 0 refills | Status: DC | PRN
Start: 2016-02-18 — End: 2017-11-12

## 2016-02-18 MED ORDER — DOCUSATE SODIUM 100 MG PO CAPS: 100.0000 mg | ORAL_CAPSULE | Freq: Every day | ORAL | 0 refills | Status: DC

## 2016-02-18 MED ORDER — ATORVASTATIN CALCIUM 40 MG PO TABS: 40.0000 mg | ORAL_TABLET | Freq: Every day | ORAL | 0 refills | Status: DC

## 2016-02-18 MED ORDER — WARFARIN SODIUM 7.5 MG PO TABS
7.5000 mg | ORAL_TABLET | Freq: Once | ORAL | Status: AC
  Administered 2016-02-18: 7.5 mg via ORAL
  Filled 2016-02-18: qty 1

## 2016-02-18 NOTE — Progress Notes (Signed)
ANTICOAGULATION CONSULT NOTE - Follow Up Consult  Pharmacy Consult for Lovenox and Coumadin Indication: PAD s/p vascular procedure  No Known Allergies  Patient Measurements: Height: 5\' 8"  (172.7 cm) Weight: 125 lb 9.6 oz (57 kg) IBW/kg (Calculated) : 68.4  Vital Signs: Temp: 98 F (36.7 C) (08/26 0513) Temp Source: Oral (08/26 0513) BP: 148/77 (08/26 0834) Pulse Rate: 98 (08/26 0513)  Labs:  Recent Labs  02/16/16 0323 02/17/16 0406 02/17/16 1244 02/18/16 0303  HGB 12.4* 9.6*  --  9.2*  HCT 38.8* 30.0*  --  28.5*  PLT 304 266  --  288  LABPROT  --   --  13.3 15.6*  INR  --   --  1.01 1.23  HEPARINUNFRC 0.38 <0.10*  --   --   CREATININE 0.83 0.94  --  1.08    Estimated Creatinine Clearance: 58.6 mL/min (by C-G formula based on SCr of 1.08 mg/dL).   Assessment: 60 y/o M with arterial occlusion, s/p endovascular intervention. Started on low-dose heparin per MD post-op (heparin level undetectable), transitioned to coumadin with lovenox bridge yesterday. INR 1.23 after first dose of coumadin. Renal function, CBC stable.  Goal of Therapy:  INR 2-3 Monitor platelets by anticoagulation protocol: Yes   Plan:  1) Continue lovenox 85mg  (~1.5mg /kg) sq q24 2) Coumadin 7.5mg  x 1 3) Daily INR  Louie CasaJennifer Jaydan Meidinger, PharmD, BCPS 02/18/2016 10:31 AM

## 2016-02-18 NOTE — Progress Notes (Addendum)
[facility] Progress Note  Subjective  - POD #2  Pain with left foot when placing weight down. Says right foot feels cool.   Objective Vitals:   02/18/16 0513 02/18/16 0834  BP: (!) 167/89 (!) 148/77  Pulse: 98   Resp: 18   Temp: 98 F (36.7 C)     Intake/Output Summary (Last 24 hours) at 02/18/16 0853 Last data filed at 02/18/16 0513  Gross per 24 hour  Intake              480 ml  Output              745 ml  Net             -265 ml   Left groin and below knee incisions c/d/i. Brisk doppler flow to left DP, peroneal and PT. Right foot with monophasic PT and DP. Slightly cool with intact motor and sensory function.   ABIs 02/17/16   RIGHT   LEFT    PRESSURE WAVEFORM  PRESSURE WAVEFORM  BRACHIAL 151 Triphasic BRACHIAL 130 Triphasic  DP 60 Monophasic DP 153 Monophasic  PT 56 Monophasic PT 158 Monophasic  GREAT TOE 18 NA GREAT TOE 45 NA    RIGHT LEFT  ABI 0.4 1.05    Assessment/Planning: 60 y.o. male is s/p: left femoral to below knee popliteal bypass with Propaten.  2 Days Post-Op   Left bypass is patent. Post-op ABIs significantly improved on left.  Will need future right leg bypass once he recovers from his current surgery. Right foot is stable.  Continue mobilization.  Coumadin started yesterday for bypass patency. On heparin bridge. Needs PCP established to follow coumadin.  Counseled on smoking cessation.  D/c home once INR therapeutic.   Raymond Gurney 02/18/2016 8:53 AM --  Addendum  Molli Knock to d/c home on lovenox bridge if follow-up can be arranged to manage coumadin.  Maris Berger, PA-C    Laboratory CBC    Component Value Date/Time   WBC 8.8 02/18/2016 0303   HGB 9.2 (L) 02/18/2016 0303   HCT 28.5 (L) 02/18/2016 0303   PLT 288 02/18/2016 0303    BMET    Component Value Date/Time   NA 137 02/18/2016 0303   K 3.6 02/18/2016 0303   CL 99 (L) 02/18/2016 0303   CO2 32 02/18/2016 0303   GLUCOSE 115 (H)  02/18/2016 0303   BUN <5 (L) 02/18/2016 0303   CREATININE 1.08 02/18/2016 0303   CREATININE 0.92 11/13/2013 1031   CALCIUM 8.4 (L) 02/18/2016 0303   GFRNONAA >60 02/18/2016 0303   GFRNONAA >89 11/13/2013 1031   GFRAA >60 02/18/2016 0303   GFRAA >89 11/13/2013 1031    COAG Lab Results  Component Value Date   INR 1.23 02/18/2016   INR 1.01 02/17/2016   INR 1.10 02/13/2016   No results found for: PTT  Antibiotics Anti-infectives    Start     Dose/Rate Route Frequency Ordered Stop   02/17/16 0000  cefUROXime (ZINACEF) 1.5 g in dextrose 5 % 50 mL IVPB     1.5 g 100 mL/hr over 30 Minutes Intravenous Every 12 hours 02/16/16 1704 02/17/16 1240   02/16/16 0600  cefUROXime (ZINACEF) 1.5 g in dextrose 5 % 50 mL IVPB     1.5 g 100 mL/hr over 30 Minutes Intravenous To Short Stay 02/15/16 1507 02/16/16 1226       Maris Berger, PA-C [facility] Office: [phone] Pager: [phone] 02/18/2016 8:53 AM  With the above.  The patient's postoperative day 2, status post left femoral posterior tibial bypass.  Incisions are healing nicely.  With good Doppler signals.  Patient is being transitioned to Coumadin.  PT has cleared the patient.  Okay to discharge once home anticoagulation has been arranged.  Durene CalWells Shenna Brissette

## 2016-02-18 NOTE — Progress Notes (Addendum)
PROGRESS NOTE                                                                                                                                                                                                             Patient Demographics:    Stephen Bowen, is a 60 y.o. male, DOB - 09/07/1955, JXB:[id number]  Admit date - 02/13/2016   Admitting Physician Lorretta Harp, MD  Outpatient Primary MD for the patient is PROVIDER NOT IN SYSTEM  LOS - 5  Chief Complaint  Patient presents with  . Leg Pain       Brief Narrative  60 y.o. male with medical history significant of PAD with claudication, hypertension, tobacco abuse, medication noncompliance, who presents with bilateral leg pain,Seen by vascular surgery, started on heparin drip, He has undergone angiogram and has significant arterial disease in his left leg with dominant PT runoff to the foot, So he went for left femoropopliteal bypass on 8/24, now doing well and being started on Warfarin anticoagulation.   Subjective:    [name] today has, No headache, No chest pain, No abdominal pain , Complains of minimal left lower extremity pain.   Assessment  & Plan :    Principal Problem:   PAD (peripheral artery disease) (HCC) Active Problems:   Hypertensive urgency   Atherosclerosis of native arteries of extremity with intermittent claudication (HCC)   Tobacco use disorder   Leg pain   Hyperkalemia   Lactic acid increased   Preoperative cardiovascular examination   PAD/Critical limb ischemia - pt has advanced peripheral vascular disease with bilateral occlusions that appear compensated.  - Management per vascular surgery , Status post aortogram with bilateral lower extremity runoff 8/23, with evidence of significant arterial disease and left leg. - Status post Left femoral endarterectomy with patch angioplasty with bovine pericardium,  Left femoral to PT artery bypass with 6 mm propaten graft  and vein patch angioplasty 8/24 - Heparin GTT, transition to warfarin per vascular surgery recommendation -  on aspirin and statin medications.  Lactic acid increased - most likely due to limb ischemia. Lactic acid level normalized - treat limb ischemia as above - Continue with IV fluids  Hyperkalemia: -  Potassium 5.8 with T-wave peaking in V3-V4 on EKG on admission. - treated with D50, 5 unit of Novolog by IV, 2g of calcium  chloride, 30 g of Kayexalate - Resolved  Hypertensive urgency: - Blood pressure significantly elevated on admission  at 226/145 - Currently acceptable on home regimen including verapamil, lisinopril and hydrochlorothiazide - IV hydralazine when necessary  Tobacco abuse: -Counseled -Nicotine patch  Code Status : Full  Family Communication  : None at [place]  Disposition Plan  : Home once he has an anticoagulation plan, may be difficult for him to afford Lovenox, seem Dr. Rogelia BogaButcher is his PCP who can follow his coumadin. I will discuss with case management today, we will need to come up with a plan. In the mean time, continue coumadin per PKS. Discussed with vascular surgery today.  Consults  :  Vascular surgery  Procedures  :  - Aortogram with bilateral lower extremity runoff 8/23 - Status post Left femoral endarterectomy with patch angioplasty with bovine pericardium,  Left femoral to PT artery bypass with 6 mm propaten graft and vein patch angioplasty 8/25  DVT Prophylaxis  :  Heparin GTT>Warfarin and Lovenox.  Lab Results  Component Value Date   PLT 288 02/18/2016    Antibiotics  :    Anti-infectives    Start     Dose/Rate Route Frequency Ordered Stop   02/17/16 0000  cefUROXime (ZINACEF) 1.5 g in dextrose 5 % 50 mL IVPB     1.5 g 100 mL/hr over 30 Minutes Intravenous Every 12 hours 02/16/16 1704 02/17/16 1240   02/16/16 0600  cefUROXime (ZINACEF) 1.5 g in dextrose 5 % 50 mL IVPB     1.5 g 100 mL/hr over 30 Minutes Intravenous To Short  Stay 02/15/16 1507 02/16/16 1226        Objective:   Vitals:   02/17/16 1307 02/17/16 2150 02/18/16 0513 02/18/16 0834  BP: (!) 155/83 (!) 161/76 (!) 167/89 (!) 148/77  Pulse: (!) 113 98 98   Resp: 16 18 18    Temp: 98.4 F (36.9 C) 99.2 F (37.3 C) 98 F (36.7 C)   TempSrc: Oral Oral Oral   SpO2: 97% 98% 100%   Weight:      Height:        Wt Readings from Last 3 Encounters:  02/14/16 57 kg (125 lb 9.6 oz)  08/31/15 61.6 kg (135 lb 12.8 oz)  08/16/15 58.5 kg (129 lb)     Intake/Output Summary (Last 24 hours) at 02/18/16 1339 Last data filed at 02/18/16 0513  Gross per 24 hour  Intake              480 ml  Output              625 ml  Net             -145 ml     Physical Exam( seen before aortogram)  Awake Alert, Oriented X 3,  Supple Neck,No JVD,  Symmetrical Chest wall movement, Good air movement bilaterally, CTAB RRR,No Gallops,Rubs or new Murmurs, No Parasternal Heave +ve B.Sounds, Abd Soft, No tenderness,  No rebound - guarding or rigidity. No cellulitis, gangrene or ischemic wound,left leg surgical wound in the groin area and medial tibial area   Data Review:    CBC  Recent Labs Lab 02/13/16 1519  02/14/16 0321 02/15/16 0250 02/16/16 0323 02/17/16 0406 02/18/16 0303  WBC 9.6  --  8.2 7.9 8.1 8.9 8.8  HGB 16.5  < > 13.2 12.6* 12.4* 9.6* 9.2*  HCT 50.3  < > 40.4 39.2 38.8* 30.0* 28.5*  PLT 334  --  330 279  304 266 288  MCV 96.4  --  96.2 96.3 97.0 97.7 97.6  MCH 31.6  --  31.4 31.0 31.0 31.3 31.5  MCHC 32.8  --  32.7 32.1 32.0 32.0 32.3  RDW 13.0  --  12.9 12.9 12.9 13.1 13.0  LYMPHSABS 1.1  --   --   --   --   --   --   MONOABS 0.6  --   --   --   --   --   --   EOSABS 0.0  --   --   --   --   --   --   BASOSABS 0.0  --   --   --   --   --   --   < > = values in this interval not displayed.  Chemistries   Recent Labs Lab 02/14/16 0321 02/15/16 0250 02/16/16 0323 02/17/16 0406 02/18/16 0303  NA 140 134* 135 134* 137  K 4.1 3.4* 3.7  4.1 3.6  CL 106 103 100* 98* 99*  CO2 25 25 28 26  32  GLUCOSE 96 91 89 85 115*  BUN 9 <5* <5* 6 <5*  CREATININE 0.95 0.83 0.83 0.94 1.08  CALCIUM 8.8* 8.0* 8.4* 8.2* 8.4*  AST 16  --   --   --   --   ALT 19  --   --   --   --   ALKPHOS 69  --   --   --   --   BILITOT 1.5*  --   --   --   --    ------------------------------------------------------------------------------------------------------------------ No results for input(s): CHOL, HDL, LDLCALC, TRIG, CHOLHDL, LDLDIRECT in the last 72 hours.  Lab Results  Component Value Date   HGBA1C 5.5 02/14/2016   ------------------------------------------------------------------------------------------------------------------ No results for input(s): TSH, T4TOTAL, T3FREE, THYROIDAB in the last 72 hours.  Invalid input(s): FREET3 ------------------------------------------------------------------------------------------------------------------ No results for input(s): VITAMINB12, FOLATE, FERRITIN, TIBC, IRON, RETICCTPCT in the last 72 hours.  Coagulation profile  Recent Labs Lab 02/13/16 0010 02/17/16 1244 02/18/16 0303  INR 1.10 1.01 1.23    No results for input(s): DDIMER in the last 72 hours.  Cardiac Enzymes No results for input(s): CKMB, TROPONINI, MYOGLOBIN in the last 168 hours.  Invalid input(s): CK ------------------------------------------------------------------------------------------------------------------ No results found for: BNP  Inpatient Medications  Scheduled Meds: . aspirin  325 mg Oral Daily  . atorvastatin  40 mg Oral q1800  . docusate sodium  100 mg Oral Daily  . enoxaparin (LOVENOX) injection  85 mg Subcutaneous Q24H  . lisinopril  10 mg Oral Daily   And  . hydrochlorothiazide  12.5 mg Oral Daily  . nicotine  21 mg Transdermal Q24H  . pantoprazole  40 mg Oral Daily  . sodium chloride flush  3 mL Intravenous Q12H  . verapamil  120 mg Oral Daily  . warfarin  7.5 mg Oral ONCE-1800  . Warfarin -  Pharmacist Dosing Inpatient   Does not apply q1800   Continuous Infusions: . sodium chloride Stopped (02/17/16 1233)  . lactated ringers 50 mL/hr at 02/16/16 1130   PRN Meds:.sodium chloride, acetaminophen **OR** acetaminophen, alum & mag hydroxide-simeth, bisacodyl, guaiFENesin-dextromethorphan, hydrALAZINE, HYDROcodone-acetaminophen, labetalol, magnesium sulfate 1 - 4 g bolus IVPB, metoprolol, morphine injection, ondansetron, phenol, potassium chloride, senna-docusate  Micro Results Recent Results (from the past 240 hour(s))  Surgical pcr screen     Status: None   Collection Time: 02/15/16  7:51 PM  Result Value Ref Range Status  MRSA, PCR NEGATIVE NEGATIVE Final   Staphylococcus aureus NEGATIVE NEGATIVE Final    Comment:        The Xpert SA Assay (FDA approved for NASAL specimens in patients over 69 years of age), is one component of a comprehensive surveillance program.  Test performance has been validated by [facility] for patients greater than or equal to 80 year old. It is not intended to diagnose infection nor to guide or monitor treatment.     Radiology Reports Ct Angio Ao+bifem W &/or Wo Contrast  Result Date: 02/13/2016 CLINICAL DATA:  Pain aggravated by walking. No rest pain and intact pulses per report. EXAM: CT ANGIOGRAPHY AOBIFEM WITHOUT AND WITH CONTRAST TECHNIQUE: CTA of the abdominal aorta bilateral lower extremities was performed in the arterial phase. Multiplanar reformats were generated. CONTRAST:  100 cc Isovue 370 intravenous COMPARISON:  None. FINDINGS: Vascular: Aorta: Standard aortic branching. Diffuse atheromatous wall thickening and calcification. No aneurysm or dissection. Visceral branches: No major branch occlusion. The celiac and SMA are without flow limiting stenosis. Moderate right renal ostial stenosis. Patent IMA. Right Leg: Iliacs: Diffuse atheromatous changes. No common or external iliac flow limiting stenosis. Most branches of the hypogastric  artery are occluded. Negative for aneurysm. Common femoral: Heavily diseased with atheromatous plaque. No flow limiting stenosis. Superficial femoral artery: Moderate narrowing just beyond the common femoral bifurcation. Occlusion near at the hiatus with well-formed muscular collaterals proximal to the occlusion. Profunda femoris artery: Heavily diseased and stenotic proximally with first order branch occlusion. The remaining arterial bed is occluded after muscular branches into the medial compartment. Popliteal artery:  Occluded throughout its extent. Below the knee: Collateral flow reconstitutes the anterior tibial artery. The tibioperoneal trunk, tibial, and peroneal arteries are thready proximally but are ramified distally and there is essentially three-vessel runoff that is symmetrically timed to the left. Left leg: Iliacs: Approximately 50% stenosis of the common iliac artery by low-density plaque. Multiple branches of the hypogastric artery are occluded. Common femoral: Heavily diseased with atheromatous plaque. No flow limiting stenosis. Superficial femoral: Occluded from its origin to near the hiatus where there are well-formed collaterals. Profunda femoris: Moderate to advanced stenosis at the origin with diffuse atheromatous narrowing and luminal irregularity, but patent serving multiple muscular branches in the thigh. Popliteal artery: Reconstituted below the hiatus by well-formed collaterals. Heavily diseased throughout its course with multi focal high-grade stenosis. At the level of the knee there is bulky calcified plaque. Short segment occlusion below the knee. Below the knee: No flow until collaterals weakly reconstitute the anterior tibial artery which is thready at the ankle. Collateral also reconstitutes the posterior tibial which is thready at the ankle. No peroneal reconstitution. Nonvascular: Lower chest and abdominal wall:  No contributory findings. Hepatobiliary: No focal liver  abnormality.No evidence of biliary obstruction or stone. Pancreas: Unremarkable. Spleen: Hypervascular mass in the medial spleen measuring 9 mm, nonspecific but often incidental hemangioma. Adrenals/Urinary Tract: Negative adrenals. Symmetric renal enhancement. No hydronephrosis. Excretion of contrast limits detection of urolithiasis. Unremarkable bladder. Stomach/Bowel:  No obstruction. No inflammation. Reproductive:No pathologic findings. Vascular/Lymphatic: No acute vascular abnormality. No mass or adenopathy. Other: No ascites or pneumoperitoneum. Musculoskeletal: No acute abnormalities. Remote left tibia fracture status post ORIF with intra medullary nail. Remote and healed fibular diaphysis fracture on the left. These results were called by telephone at the time of interpretation on 02/13/2016 at ~7:10 pm to Dr. Nelva Nay , who verbally acknowledged these results. Review of the MIP images confirms the above findings. IMPRESSION:  1. Advanced peripheral vascular disease with bilateral occlusions that appear compensated. 2. Right SFA occlusion near the adductor hiatus with infrapopliteal reconstitution and three-vessel runoff. Heavily diseased profunda femoris with early branch occlusions. 3. Early left SFA occlusion with thready discontinuous popliteal and below the knee reconstitution. Single vessel/posterior tibial runoff at the ankle. 4. No flow limiting aortic or iliac stenosis. 5. Moderate right renal ostial stenosis. Electronically Signed   By: Marnee Spring M.D.   On: 02/13/2016 19:21     Mathea Frieling Vergie Living M.D on 02/18/2016 at 1:39 PM  Between 7am to 7pm - Pager - [phone]  After 7pm go to [email or web address] - password [facility]  [facility] -  Office  [phone]

## 2016-02-19 LAB — CBC
HEMATOCRIT: 27.8 % — AB (ref 39.0–52.0)
HEMOGLOBIN: 8.8 g/dL — AB (ref 13.0–17.0)
MCH: 30.9 pg (ref 26.0–34.0)
MCHC: 31.7 g/dL (ref 30.0–36.0)
MCV: 97.5 fL (ref 78.0–100.0)
PLATELETS: 317 10*3/uL (ref 150–400)
RBC: 2.85 MIL/uL — AB (ref 4.22–5.81)
RDW: 13.1 % (ref 11.5–15.5)
WBC: 7.6 10*3/uL (ref 4.0–10.5)

## 2016-02-19 LAB — PROTIME-INR
INR: 1.63
Prothrombin Time: 19.5 seconds — ABNORMAL HIGH (ref 11.4–15.2)

## 2016-02-19 MED ORDER — ENOXAPARIN SODIUM 100 MG/ML ~~LOC~~ SOLN: 85.0000 mg | SUBCUTANEOUS | 1 refills | Status: DC

## 2016-02-19 MED ORDER — WARFARIN SODIUM 5 MG PO TABS
5.0000 mg | ORAL_TABLET | Freq: Once | ORAL | Status: AC
  Administered 2016-02-19: 5 mg via ORAL
  Filled 2016-02-19: qty 1

## 2016-02-19 MED ORDER — WARFARIN SODIUM 5 MG PO TABS: 5.0000 mg | ORAL_TABLET | Freq: Every day | ORAL | 0 refills | Status: DC

## 2016-02-19 NOTE — Discharge Summary (Addendum)
Discharge Summary  Stephen Bowen GNF:[id number] DOB: [birth year]  PCP: PROVIDER NOT IN SYSTEM  Admit date: 02/13/2016 Discharge date: 02/19/2016   Recommendations for Outpatient Follow-up:  1. PCP Dr.  Rogelia Boga within one week. 2. INR check Tuesday 8/29 with Coumadin adjustment 3. Vascular surgery Dr. Randie Heinz in 2 weeks  Discharge Diagnoses:  Active Hospital Problems   Diagnosis Date Noted  . PAD (peripheral artery disease) (HCC) 02/13/2016  . Preoperative cardiovascular examination   . Leg pain 02/13/2016  . Hyperkalemia 02/13/2016  . Lactic acid increased 02/13/2016  . Tobacco use disorder 08/31/2015  . Atherosclerosis of native arteries of extremity with intermittent claudication (HCC) 07/07/2015  . Hypertensive urgency 11/13/2013    Resolved Hospital Problems   Diagnosis Date Noted Date Resolved  No resolved problems to display.    Discharge Condition: Stable   Diet recommendation: Regular   Vitals:   02/19/16 0518 02/19/16 0856  BP: 126/74 111/86  Pulse: 89   Resp: 20   Temp: 97.9 F (36.6 C)     History of present illness and hospital course:  60 y.o.malewith medical history significant ofPAD with claudication, hypertension, tobacco abuse, medication noncompliance, who presents with bilateral leg pain,Seen by vascular surgery, started on heparin drip, He underwent angiogram and hassignificant arterial disease in his left leg with dominant PT runoff to the foot, So he went for left femoropopliteal bypass on 8/24, now doing well and being started on Warfarin anticoagulation. He has done well with coumadin, leg is doing okay and cleared for discharge per vascular surgery. He does have a PCP here at [facility] who he can see, was given match letter for prescription assistance and was taught how to give himself his BID lovenox injections. I did tell him to follow up with PCP for blood draw to have his INR checked on Tuesday 8/29 and he expressed understanding.  Hospital  Course:  Principal Problem:   PAD (peripheral artery disease) (HCC) Active Problems:   Hypertensive urgency   Atherosclerosis of native arteries of extremity with intermittent claudication (HCC)   Tobacco use disorder   Leg pain   Hyperkalemia   Lactic acidosis due to limb ischemia Acute blood loss anemia s/p transfusion   Preoperative cardiovascular examination   Procedures:  left femoral to below knee popliteal bypass with Propaten 8/24   Consultations:  Vascular surgery   Discharge Exam: BP 111/86   Pulse 89   Temp 97.9 F (36.6 C) (Oral)   Resp 20   Ht 5\' 8"  (1.727 m)   Wt 57 kg (125 lb 9.6 oz)   SpO2 98%   BMI 19.10 kg/m  General:  Alert, oriented, calm, in no acute distress  Eyes: pupils round and reactive to light and accomodation, clear sclerea Neck: supple, no masses, trachea mildline  Cardiovascular: RRR, no murmurs or rubs, no peripheral edema  Respiratory: clear to auscultation bilaterally, no wheezes, no crackles  Abdomen: soft, nontender, nondistended, normal bowel tones heard  Skin: dry, no rashes  Musculoskeletal: no joint effusions, normal range of motion, left leg with well healing incisions  Psychiatric: appropriate affect, normal speech  Neurologic: extraocular muscles intact, clear speech, moving all extremities with intact sensorium    Discharge Instructions You were cared for by a hospitalist during your hospital stay. If you have any questions about your discharge medications or the care you received while you were in the hospital after you are discharged, you can call the unit and asked to speak with the hospitalist on call  if the hospitalist that took care of you is not available. Once you are discharged, your primary care physician will handle any further medical issues. Please note that NO REFILLS for any discharge medications will be authorized once you are discharged, as it is imperative that you return to your primary care physician (or  establish a relationship with a primary care physician if you do not have one) for your aftercare needs so that they can reassess your need for medications and monitor your lab values.  Discharge Instructions    Diet - low sodium heart healthy    Complete by:  As directed   Increase activity slowly    Complete by:  As directed       Medication List    TAKE these medications   aspirin 325 MG tablet Take 1 tablet (325 mg total) by mouth daily.   atorvastatin 40 MG tablet Commonly known as:  LIPITOR Take 1 tablet (40 mg total) by mouth daily at 6 PM.   bisacodyl 5 MG EC tablet Commonly known as:  DULCOLAX Take 1 tablet (5 mg total) by mouth daily as needed for moderate constipation.   docusate sodium 100 MG capsule Commonly known as:  COLACE Take 1 capsule (100 mg total) by mouth daily.   enoxaparin 100 MG/ML injection Commonly known as:  LOVENOX Inject 0.85 mLs (85 mg total) into the skin daily.   HYDROcodone-acetaminophen 5-325 MG tablet Commonly known as:  NORCO/VICODIN Take 1-2 tablets by mouth every 4 (four) hours as needed for moderate pain.   lisinopril-hydrochlorothiazide 10-12.5 MG tablet Commonly known as:  ZESTORETIC Take 1 tablet by mouth daily.   nicotine 21 mg/24hr patch Commonly known as:  NICODERM CQ - dosed in mg/24 hours Place 1 patch (21 mg total) onto the skin daily.   nicotine polacrilex 4 MG gum Commonly known as:  NICORETTE Take 1 each (4 mg total) by mouth as needed for smoking cessation.   pantoprazole 40 MG tablet Commonly known as:  PROTONIX Take 1 tablet (40 mg total) by mouth daily.   selenium sulfide 1 % Lotn Commonly known as:  SELSUN Put on your face in the shower and wash off daily   verapamil 120 MG CR tablet Commonly known as:  CALAN-SR Take 1 tablet (120 mg total) by mouth daily.   warfarin 5 MG tablet Commonly known as:  COUMADIN Take 1 tablet (5 mg total) by mouth daily.          No Known Allergies Follow-up  Information    [facility] Follow up on 04/04/2016.   Why:  11 am for hospital follow up apt (over flow for [facility] clinic) Contact information: [address] [place] [address]       [facility] .   Contact information: [address] [place] [address] [phone]        Lemar Livings, MD In 2 weeks.   Specialties:  Vascular Surgery, Cardiology Why:  Our office will call you to arrange an appointment  Contact information: [address] [place] [address] [phone]            The results of significant diagnostics from this hospitalization (including imaging, microbiology, ancillary and laboratory) are listed below for reference.    Significant Diagnostic Studies: Ct Angio Ao+bifem W &/or Wo Contrast  Result Date: 02/13/2016 CLINICAL DATA:  Pain aggravated by walking. No rest pain and intact pulses per report.  EXAM: CT ANGIOGRAPHY AOBIFEM WITHOUT AND WITH CONTRAST TECHNIQUE: CTA of the abdominal aorta bilateral lower extremities was performed in the arterial phase. Multiplanar reformats were generated. CONTRAST:  100 cc Isovue 370 intravenous COMPARISON:  None. FINDINGS: Vascular: Aorta: Standard aortic branching. Diffuse atheromatous wall thickening and calcification. No aneurysm or dissection. Visceral branches: No major branch occlusion. The celiac and SMA are without flow limiting stenosis. Moderate right renal ostial stenosis. Patent IMA. Right Leg: Iliacs: Diffuse atheromatous changes. No common or external iliac flow limiting stenosis. Most branches of the hypogastric artery are occluded. Negative for aneurysm. Common femoral: Heavily diseased with atheromatous plaque. No flow limiting stenosis. Superficial femoral artery: Moderate narrowing just beyond the common femoral bifurcation. Occlusion near at the hiatus with well-formed muscular collaterals proximal to the occlusion. Profunda femoris  artery: Heavily diseased and stenotic proximally with first order branch occlusion. The remaining arterial bed is occluded after muscular branches into the medial compartment. Popliteal artery:  Occluded throughout its extent. Below the knee: Collateral flow reconstitutes the anterior tibial artery. The tibioperoneal trunk, tibial, and peroneal arteries are thready proximally but are ramified distally and there is essentially three-vessel runoff that is symmetrically timed to the left. Left leg: Iliacs: Approximately 50% stenosis of the common iliac artery by low-density plaque. Multiple branches of the hypogastric artery are occluded. Common femoral: Heavily diseased with atheromatous plaque. No flow limiting stenosis. Superficial femoral: Occluded from its origin to near the hiatus where there are well-formed collaterals. Profunda femoris: Moderate to advanced stenosis at the origin with diffuse atheromatous narrowing and luminal irregularity, but patent serving multiple muscular branches in the thigh. Popliteal artery: Reconstituted below the hiatus by well-formed collaterals. Heavily diseased throughout its course with multi focal high-grade stenosis. At the level of the knee there is bulky calcified plaque. Short segment occlusion below the knee. Below the knee: No flow until collaterals weakly reconstitute the anterior tibial artery which is thready at the ankle. Collateral also reconstitutes the posterior tibial which is thready at the ankle. No peroneal reconstitution. Nonvascular: Lower chest and abdominal wall:  No contributory findings. Hepatobiliary: No focal liver abnormality.No evidence of biliary obstruction or stone. Pancreas: Unremarkable. Spleen: Hypervascular mass in the medial spleen measuring 9 mm, nonspecific but often incidental hemangioma. Adrenals/Urinary Tract: Negative adrenals. Symmetric renal enhancement. No hydronephrosis. Excretion of contrast limits detection of urolithiasis.  Unremarkable bladder. Stomach/Bowel:  No obstruction. No inflammation. Reproductive:No pathologic findings. Vascular/Lymphatic: No acute vascular abnormality. No mass or adenopathy. Other: No ascites or pneumoperitoneum. Musculoskeletal: No acute abnormalities. Remote left tibia fracture status post ORIF with intra medullary nail. Remote and healed fibular diaphysis fracture on the left. These results were called by telephone at the time of interpretation on 02/13/2016 at ~7:10 pm to Dr. Nelva NayOBERT BEATON , who verbally acknowledged these results. Review of the MIP images confirms the above findings. IMPRESSION: 1. Advanced peripheral vascular disease with bilateral occlusions that appear compensated. 2. Right SFA occlusion near the adductor hiatus with infrapopliteal reconstitution and three-vessel runoff. Heavily diseased profunda femoris with early branch occlusions. 3. Early left SFA occlusion with thready discontinuous popliteal and below the knee reconstitution. Single vessel/posterior tibial runoff at the ankle. 4. No flow limiting aortic or iliac stenosis. 5. Moderate right renal ostial stenosis. Electronically Signed   By: Marnee SpringJonathon  Watts M.D.   On: 02/13/2016 19:21    Microbiology: Recent Results (from the past 240 hour(s))  Surgical pcr screen     Status: None   Collection Time: 02/15/16  7:51 PM  Result Value Ref Range Status   MRSA, PCR NEGATIVE NEGATIVE Final   Staphylococcus aureus NEGATIVE NEGATIVE Final    Comment:        The Xpert SA Assay (FDA approved for NASAL specimens in patients over 33 years of age), is one component of a comprehensive surveillance program.  Test performance has been validated by [facility] for patients greater than or equal to 33 year old. It is not intended to diagnose infection nor to guide or monitor treatment.      Labs: Basic Metabolic Panel:  Recent Labs Lab 02/14/16 0321 02/15/16 0250 02/16/16 0323 02/17/16 0406 02/18/16 0303  NA 140  134* 135 134* 137  K 4.1 3.4* 3.7 4.1 3.6  CL 106 103 100* 98* 99*  CO2 25 25 28 26  32  GLUCOSE 96 91 89 85 115*  BUN 9 <5* <5* 6 <5*  CREATININE 0.95 0.83 0.83 0.94 1.08  CALCIUM 8.8* 8.0* 8.4* 8.2* 8.4*   Liver Function Tests:  Recent Labs Lab 02/14/16 0321  AST 16  ALT 19  ALKPHOS 69  BILITOT 1.5*  PROT 6.1*  ALBUMIN 3.1*   No results for input(s): LIPASE, AMYLASE in the last 168 hours. No results for input(s): AMMONIA in the last 168 hours. CBC:  Recent Labs Lab 02/13/16 1519  02/15/16 0250 02/16/16 0323 02/17/16 0406 02/18/16 0303 02/19/16 0422  WBC 9.6  < > 7.9 8.1 8.9 8.8 7.6  NEUTROABS 7.8*  --   --   --   --   --   --   HGB 16.5  < > 12.6* 12.4* 9.6* 9.2* 8.8*  HCT 50.3  < > 39.2 38.8* 30.0* 28.5* 27.8*  MCV 96.4  < > 96.3 97.0 97.7 97.6 97.5  PLT 334  < > 279 304 266 288 317  < > = values in this interval not displayed. Cardiac Enzymes: No results for input(s): CKTOTAL, CKMB, CKMBINDEX, TROPONINI in the last 168 hours. BNP: BNP (last 3 results) No results for input(s): BNP in the last 8760 hours.  ProBNP (last 3 results) No results for input(s): PROBNP in the last 8760 hours.  CBG: No results for input(s): GLUCAP in the last 168 hours.  Time spent: 35 minutes were spent in preparing this discharge including medication reconciliation, counseling, and coordination of care.  Signed:  Damareon Lanni Vergie Living  [facility] 02/19/2016, 12:35 PM

## 2016-02-19 NOTE — Progress Notes (Signed)
ANTICOAGULATION CONSULT NOTE - Follow Up Consult  Pharmacy Consult for Lovenox and Coumadin Indication: PAD s/p vascular procedure  No Known Allergies  Patient Measurements: Height: 5\' 8"  (172.7 cm) Weight: 125 lb 9.6 oz (57 kg) IBW/kg (Calculated) : 68.4  Vital Signs: Temp: 97.9 F (36.6 C) (08/27 0518) Temp Source: Oral (08/27 0518) BP: 111/86 (08/27 0856) Pulse Rate: 89 (08/27 0518)  Labs:  Recent Labs  02/17/16 0406 02/17/16 1244 02/18/16 0303 02/19/16 0422  HGB 9.6*  --  9.2* 8.8*  HCT 30.0*  --  28.5* 27.8*  PLT 266  --  288 317  LABPROT  --  13.3 15.6* 19.5*  INR  --  1.01 1.23 1.63  HEPARINUNFRC <0.10*  --   --   --   CREATININE 0.94  --  1.08  --     Estimated Creatinine Clearance: 58.6 mL/min (by C-G formula based on SCr of 1.08 mg/dL).   Assessment: 60 y/o M with arterial occlusion, s/p endovascular intervention. Started on low-dose heparin per MD post-op (heparin level undetectable), transitioned to coumadin with lovenox bridge 8/25. INR 1.63 after two doses of coumadin - fast trend up. CBC stable. No bleeding.  Goal of Therapy:  INR 2-3 Monitor platelets by anticoagulation protocol: Yes   Plan:  1) Continue lovenox 85mg  (~1.5mg /kg) sq q24 2) Coumadin 5mg  x 1 3) Daily INR  **If he goes home today would recommend coumadin 5mg  daily until next INR check.  Louie CasaJennifer Stayce Delancy, PharmD, BCPS 02/19/2016 11:20 AM

## 2016-02-19 NOTE — Progress Notes (Addendum)
  [facility] Progress Note  Subjective  - POD #3  Some numbness right foot that is unchanged.   Objective Vitals:   02/18/16 1957 02/19/16 0518  BP: 124/76 126/74  Pulse: 83 89  Resp: 20 20  Temp: 98.4 F (36.9 C) 97.9 F (36.6 C)    Intake/Output Summary (Last 24 hours) at 02/19/16 0831 Last data filed at 02/19/16 0553  Gross per 24 hour  Intake              360 ml  Output             1200 ml  Net             -840 ml   Left groin and below knee incisions clean. No hematoma. Left foot warm. Right foot is warm with motor and sensory function intact.   Assessment/Planning: 60 y.o. male is s/p: left femoral to below knee popliteal bypass with Propaten.  3 Days Post-Op   Doing well. Symptoms in right foot are stable. Left foot symptoms significantly improved.  Ok for d/c from vascular standpoint once arrangements for anticoagulation are arranged.  F/u in 2 weeks with Dr. Randie Heinzain. Will discuss right leg management at that time.    Raymond GurneyKimberly A Trinh  02/19/2016 8:31 AM --  Laboratory CBC    Component Value Date/Time   WBC 7.6 02/19/2016 0422   HGB 8.8 (L) 02/19/2016 0422   HCT 27.8 (L) 02/19/2016 0422   PLT 317 02/19/2016 0422    BMET    Component Value Date/Time   NA 137 02/18/2016 0303   K 3.6 02/18/2016 0303   CL 99 (L) 02/18/2016 0303   CO2 32 02/18/2016 0303   GLUCOSE 115 (H) 02/18/2016 0303   BUN <5 (L) 02/18/2016 0303   CREATININE 1.08 02/18/2016 0303   CREATININE 0.92 11/13/2013 1031   CALCIUM 8.4 (L) 02/18/2016 0303   GFRNONAA >60 02/18/2016 0303   GFRNONAA >89 11/13/2013 1031   GFRAA >60 02/18/2016 0303   GFRAA >89 11/13/2013 1031    COAG Lab Results  Component Value Date   INR 1.63 02/19/2016   INR 1.23 02/18/2016   INR 1.01 02/17/2016   No results found for: PTT  Antibiotics Anti-infectives    Start     Dose/Rate Route Frequency Ordered Stop   02/17/16 0000  cefUROXime (ZINACEF) 1.5 g in dextrose 5 % 50 mL IVPB     1.5 g 100 mL/hr over 30 Minutes Intravenous Every 12 hours 02/16/16 1704 02/17/16 1240   02/16/16 0600  cefUROXime (ZINACEF) 1.5 g in dextrose 5 % 50 mL IVPB     1.5 g 100 mL/hr over 30 Minutes Intravenous To Short Stay 02/15/16 1507 02/16/16 1226       Maris BergerKimberly Trinh, PA-C [facility] Office: [phone] Pager: [phone] 02/19/2016 8:31 AM    I agree with the above.  Left leg healing appropriately.  C/o right leg numbness which is stable.  He has motor and sensory function intact in the right leg.  Continue anticoagulation.  OK to d/c.  F/u 2 weeks with Dr. Leota Sauersain   Wells Brabham

## 2016-02-19 NOTE — Progress Notes (Signed)
Stephen Bowen to be D/C'd Home per MD order. Discussed with the patient and all questions fully answered.    Medication List    TAKE these medications   aspirin 325 MG tablet Take 1 tablet (325 mg total) by mouth daily.   atorvastatin 40 MG tablet Commonly known as:  LIPITOR Take 1 tablet (40 mg total) by mouth daily at 6 PM.   bisacodyl 5 MG EC tablet Commonly known as:  DULCOLAX Take 1 tablet (5 mg total) by mouth daily as needed for moderate constipation.   docusate sodium 100 MG capsule Commonly known as:  COLACE Take 1 capsule (100 mg total) by mouth daily.   enoxaparin 100 MG/ML injection Commonly known as:  LOVENOX Inject 0.85 mLs (85 mg total) into the skin daily.   HYDROcodone-acetaminophen 5-325 MG tablet Commonly known as:  NORCO/VICODIN Take 1-2 tablets by mouth every 4 (four) hours as needed for moderate pain.   lisinopril-hydrochlorothiazide 10-12.5 MG tablet Commonly known as:  ZESTORETIC Take 1 tablet by mouth daily.   nicotine 21 mg/24hr patch Commonly known as:  NICODERM CQ - dosed in mg/24 hours Place 1 patch (21 mg total) onto the skin daily.   nicotine polacrilex 4 MG gum Commonly known as:  NICORETTE Take 1 each (4 mg total) by mouth as needed for smoking cessation.   pantoprazole 40 MG tablet Commonly known as:  PROTONIX Take 1 tablet (40 mg total) by mouth daily.   selenium sulfide 1 % Lotn Commonly known as:  SELSUN Put on your face in the shower and wash off daily   verapamil 120 MG CR tablet Commonly known as:  CALAN-SR Take 1 tablet (120 mg total) by mouth daily.   warfarin 5 MG tablet Commonly known as:  COUMADIN Take 1 tablet (5 mg total) by mouth daily.       [facility], Skin clean, dry and intact without evidence of skin break down, no evidence of skin tears noted.  IV catheter discontinued intact. Site without signs and symptoms of complications. Dressing and pressure applied.  An After Visit Summary was printed and given to the  patient.  Patient escorted via WC, and D/C home via private auto.  [name], [name] N  02/19/2016 5:32 PM

## 2016-02-19 NOTE — Progress Notes (Signed)
CM gave pt MATCH letter with list of participating pharmacies.  Pt verbalized understanding of all MATCH parameters.  Previous CM addressed clinic issues.  No other CM needs were communicated.

## 2016-02-21 ENCOUNTER — Telehealth: Payer: Self-pay | Admitting: Vascular Surgery

## 2016-02-21 NOTE — Telephone Encounter (Signed)
Sched appt 9/15 at 9:00. Lm on hm# to inform pt.

## 2016-02-21 NOTE — Telephone Encounter (Signed)
-----   Message from Sharee PimpleMarilyn K McChesney, RN sent at 02/20/2016 10:42 AM EDT ----- Regarding: schedule   ----- Message ----- From: Raymond GurneyKimberly A Trinh, PA-C Sent: 02/18/2016   9:38 AM To: [facility] Charge Pool  S/p left fem to PT bypass with propaten 02/16/16  F/u with Dr. Randie Heinzain in 2 weeks.  Thanks Selena BattenKim

## 2016-02-23 MED FILL — HYDROCODON-APAP 5-325: 5-325 | 3 days supply | Qty: 30 | Fill #0

## 2016-02-23 MED FILL — ENOXAPARIN 100 MG/ML SYRN: 100 | 10 days supply | Qty: 10 | Fill #0

## 2016-02-23 MED FILL — WARFARIN SODIUM 5 MG TABLET: 5 | 30 days supply | Qty: 30 | Fill #0

## 2016-03-02 ENCOUNTER — Encounter: Payer: Self-pay | Admitting: Vascular Surgery

## 2016-03-09 ENCOUNTER — Encounter: Payer: Self-pay | Admitting: Vascular Surgery

## 2016-04-04 ENCOUNTER — Ambulatory Visit: Payer: Self-pay | Admitting: Family Medicine

## 2016-06-14 ENCOUNTER — Telehealth: Payer: Self-pay | Admitting: *Deleted

## 2016-06-14 NOTE — Telephone Encounter (Signed)
Call from pt c/o left lower leg pain x 1wk. Hx left femoral to below knee popliteal bypass with Propaten on 02/16/16-was discharged with warfarin rx , but never picked it up, does he still need this?.  States leg feels "stiff" and "tingling" "cant hardly walk on it at times".  No swelling, warm to touch, or redness noted.  Pt offered an appt for today, but stated he will have to find someone to bring him.  He declined an appt time and date at this time, but will call back shortly to inform us of a time that he can get here.  Will await call back form patient.  Pt also advised call or go to ED should his symptome worsen.Kingsley SpittleGoldston, Kazaria Gaertner Cassady12/21/201710:56 AM

## 2016-06-14 NOTE — Telephone Encounter (Signed)
I think he would be better served by going to his vascular surgeon as he needs that leg evaluated. I highly recommend that he be seen ASAP in case he has a recurrent blockage in that leg [facility] or with vascular sx so we can evaluate pulses in that leg

## 2016-06-14 NOTE — Telephone Encounter (Signed)
Call made to [facility] to inquire about getting patient an appt to be seen.  Message left on recorder for nurse.  Of note, pt does not have insurance or transportation, which may prevent him from being seen if his out of pockets expenses are great or if he's unable to secure a ride. Criss Alvine.Nyxon Strupp Cassady12/21/201711:43 AM   Awaiting call back from pt and [facility].Shem Plemmons Cassady12/21/201711:45 AM

## 2016-06-15 ENCOUNTER — Encounter: Payer: Self-pay | Admitting: Vascular Surgery

## 2016-06-19 ENCOUNTER — Inpatient Hospital Stay (HOSPITAL_COMMUNITY)
Admission: EM | Admit: 2016-06-19 | Discharge: 2016-07-10 | DRG: 239 | Disposition: A | Discharge status: Skilled Nursing Facility | Payer: Medicaid Other | Attending: Vascular Surgery | Admitting: Vascular Surgery

## 2016-06-19 ENCOUNTER — Encounter (HOSPITAL_COMMUNITY): Payer: Self-pay | Admitting: Vascular Surgery

## 2016-06-19 DIAGNOSIS — I97811 Intraoperative cerebrovascular infarction during other surgery: Secondary | ICD-10-CM | POA: Diagnosis not present

## 2016-06-19 DIAGNOSIS — Z9119 Patient's noncompliance with other medical treatment and regimen: Secondary | ICD-10-CM

## 2016-06-19 DIAGNOSIS — E876 Hypokalemia: Secondary | ICD-10-CM | POA: Diagnosis not present

## 2016-06-19 DIAGNOSIS — I160 Hypertensive urgency: Secondary | ICD-10-CM | POA: Diagnosis not present

## 2016-06-19 DIAGNOSIS — R7989 Other specified abnormal findings of blood chemistry: Secondary | ICD-10-CM | POA: Diagnosis not present

## 2016-06-19 DIAGNOSIS — I7090 Unspecified atherosclerosis: Secondary | ICD-10-CM

## 2016-06-19 DIAGNOSIS — Z681 Body mass index (BMI) 19 or less, adult: Secondary | ICD-10-CM

## 2016-06-19 DIAGNOSIS — R4701 Aphasia: Secondary | ICD-10-CM | POA: Diagnosis not present

## 2016-06-19 DIAGNOSIS — I2510 Atherosclerotic heart disease of native coronary artery without angina pectoris: Secondary | ICD-10-CM | POA: Diagnosis present

## 2016-06-19 DIAGNOSIS — E785 Hyperlipidemia, unspecified: Secondary | ICD-10-CM | POA: Diagnosis present

## 2016-06-19 DIAGNOSIS — R9431 Abnormal electrocardiogram [ECG] [EKG]: Secondary | ICD-10-CM | POA: Diagnosis present

## 2016-06-19 DIAGNOSIS — H47612 Cortical blindness, left side of brain: Secondary | ICD-10-CM | POA: Diagnosis not present

## 2016-06-19 DIAGNOSIS — Z8249 Family history of ischemic heart disease and other diseases of the circulatory system: Secondary | ICD-10-CM | POA: Diagnosis not present

## 2016-06-19 DIAGNOSIS — E46 Unspecified protein-calorie malnutrition: Secondary | ICD-10-CM | POA: Diagnosis present

## 2016-06-19 DIAGNOSIS — L8990 Pressure ulcer of unspecified site, unspecified stage: Secondary | ICD-10-CM | POA: Diagnosis not present

## 2016-06-19 DIAGNOSIS — F17200 Nicotine dependence, unspecified, uncomplicated: Secondary | ICD-10-CM | POA: Diagnosis present

## 2016-06-19 DIAGNOSIS — G936 Cerebral edema: Secondary | ICD-10-CM | POA: Diagnosis not present

## 2016-06-19 DIAGNOSIS — Z79899 Other long term (current) drug therapy: Secondary | ICD-10-CM

## 2016-06-19 DIAGNOSIS — F17210 Nicotine dependence, cigarettes, uncomplicated: Secondary | ICD-10-CM | POA: Diagnosis present

## 2016-06-19 DIAGNOSIS — R209 Unspecified disturbances of skin sensation: Secondary | ICD-10-CM

## 2016-06-19 DIAGNOSIS — I63432 Cerebral infarction due to embolism of left posterior cerebral artery: Secondary | ICD-10-CM | POA: Diagnosis not present

## 2016-06-19 DIAGNOSIS — Z419 Encounter for procedure for purposes other than remedying health state, unspecified: Secondary | ICD-10-CM

## 2016-06-19 DIAGNOSIS — I959 Hypotension, unspecified: Secondary | ICD-10-CM | POA: Diagnosis not present

## 2016-06-19 DIAGNOSIS — I2109 ST elevation (STEMI) myocardial infarction involving other coronary artery of anterior wall: Secondary | ICD-10-CM

## 2016-06-19 DIAGNOSIS — M79672 Pain in left foot: Secondary | ICD-10-CM | POA: Diagnosis present

## 2016-06-19 DIAGNOSIS — T82858A Stenosis of vascular prosthetic devices, implants and grafts, initial encounter: Secondary | ICD-10-CM | POA: Diagnosis present

## 2016-06-19 DIAGNOSIS — Z7982 Long term (current) use of aspirin: Secondary | ICD-10-CM | POA: Diagnosis not present

## 2016-06-19 DIAGNOSIS — I998 Other disorder of circulatory system: Secondary | ICD-10-CM

## 2016-06-19 DIAGNOSIS — H47611 Cortical blindness, right side of brain: Secondary | ICD-10-CM | POA: Diagnosis not present

## 2016-06-19 DIAGNOSIS — Y832 Surgical operation with anastomosis, bypass or graft as the cause of abnormal reaction of the patient, or of later complication, without mention of misadventure at the time of the procedure: Secondary | ICD-10-CM | POA: Diagnosis present

## 2016-06-19 DIAGNOSIS — R4182 Altered mental status, unspecified: Secondary | ICD-10-CM

## 2016-06-19 DIAGNOSIS — J342 Deviated nasal septum: Secondary | ICD-10-CM | POA: Diagnosis present

## 2016-06-19 DIAGNOSIS — F1010 Alcohol abuse, uncomplicated: Secondary | ICD-10-CM | POA: Diagnosis present

## 2016-06-19 DIAGNOSIS — Z823 Family history of stroke: Secondary | ICD-10-CM

## 2016-06-19 DIAGNOSIS — T82898A Other specified complication of vascular prosthetic devices, implants and grafts, initial encounter: Secondary | ICD-10-CM | POA: Diagnosis present

## 2016-06-19 DIAGNOSIS — D62 Acute posthemorrhagic anemia: Secondary | ICD-10-CM | POA: Diagnosis not present

## 2016-06-19 DIAGNOSIS — M6281 Muscle weakness (generalized): Secondary | ICD-10-CM

## 2016-06-19 DIAGNOSIS — R040 Epistaxis: Secondary | ICD-10-CM | POA: Diagnosis not present

## 2016-06-19 DIAGNOSIS — I639 Cerebral infarction, unspecified: Secondary | ICD-10-CM

## 2016-06-19 DIAGNOSIS — I10 Essential (primary) hypertension: Secondary | ICD-10-CM | POA: Diagnosis present

## 2016-06-19 HISTORY — DX: Patient's noncompliance with other medical treatment and regimen due to unspecified reason: Z91.199

## 2016-06-19 HISTORY — DX: Patient's noncompliance with other medical treatment and regimen: Z91.19

## 2016-06-19 LAB — CBC
HEMATOCRIT: 44.5 % (ref 39.0–52.0)
HEMOGLOBIN: 15.3 g/dL (ref 13.0–17.0)
MCH: 30.4 pg (ref 26.0–34.0)
MCHC: 34.4 g/dL (ref 30.0–36.0)
MCV: 88.5 fL (ref 78.0–100.0)
Platelets: 391 10*3/uL (ref 150–400)
RBC: 5.03 MIL/uL (ref 4.22–5.81)
RDW: 14.3 % (ref 11.5–15.5)
WBC: 12.2 10*3/uL — AB (ref 4.0–10.5)

## 2016-06-19 LAB — I-STAT CG4 LACTIC ACID, ED: LACTIC ACID, VENOUS: 0.78 mmol/L (ref 0.5–1.9)

## 2016-06-19 LAB — URINALYSIS, ROUTINE W REFLEX MICROSCOPIC
Bilirubin Urine: NEGATIVE
GLUCOSE, UA: NEGATIVE mg/dL
Ketones, ur: 5 mg/dL — AB
Leukocytes, UA: NEGATIVE
Nitrite: NEGATIVE
PROTEIN: NEGATIVE mg/dL
SPECIFIC GRAVITY, URINE: 1.009 (ref 1.005–1.030)
pH: 5 (ref 5.0–8.0)

## 2016-06-19 LAB — BASIC METABOLIC PANEL
ANION GAP: 13 (ref 5–15)
BUN: 9 mg/dL (ref 6–20)
CO2: 25 mmol/L (ref 22–32)
Calcium: 9.4 mg/dL (ref 8.9–10.3)
Chloride: 96 mmol/L — ABNORMAL LOW (ref 101–111)
Creatinine, Ser: 0.96 mg/dL (ref 0.61–1.24)
GFR calc Af Amer: >60 mL/min (ref >60)
Glucose, Bld: 114 mg/dL — ABNORMAL HIGH (ref 65–99)
POTASSIUM: 4.2 mmol/L (ref 3.5–5.1)
SODIUM: 134 mmol/L — AB (ref 135–145)

## 2016-06-19 LAB — PROTIME-INR
INR: 0.92
INR: 0.97
Prothrombin Time: 12.4 seconds (ref 11.4–15.2)
Prothrombin Time: 12.9 seconds (ref 11.4–15.2)

## 2016-06-19 LAB — I-STAT TROPONIN, ED: Troponin i, poc: 0.01 ng/mL (ref 0.00–0.08)

## 2016-06-19 LAB — MRSA PCR SCREENING: MRSA BY PCR: NEGATIVE

## 2016-06-19 LAB — HEPARIN LEVEL (UNFRACTIONATED): HEPARIN UNFRACTIONATED: 0.6 [IU]/mL (ref 0.30–0.70)

## 2016-06-19 MED ORDER — POTASSIUM CHLORIDE CRYS ER 20 MEQ PO TBCR: 20.0000 meq | EXTENDED_RELEASE_TABLET | Freq: Once | ORAL | Status: DC

## 2016-06-19 MED ORDER — LISINOPRIL 10 MG PO TABS
10.0000 mg | ORAL_TABLET | Freq: Every day | ORAL | Status: DC
  Administered 2016-06-20: 10 mg via ORAL
  Filled 2016-06-19: qty 1

## 2016-06-19 MED ORDER — HYDRALAZINE HCL 20 MG/ML IJ SOLN
5.0000 mg | INTRAMUSCULAR | Status: DC | PRN
  Administered 2016-06-19 – 2016-06-28 (×16): 5 mg via INTRAVENOUS
  Filled 2016-06-19 (×12): qty 1

## 2016-06-19 MED ORDER — LABETALOL HCL 5 MG/ML IV SOLN
10.0000 mg | INTRAVENOUS | Status: AC | PRN
  Administered 2016-06-19 – 2016-06-20 (×4): 10 mg via INTRAVENOUS
  Filled 2016-06-19 (×3): qty 4

## 2016-06-19 MED ORDER — MORPHINE SULFATE (PF) 4 MG/ML IV SOLN
2.0000 mg | INTRAVENOUS | Status: DC | PRN
  Administered 2016-06-19 – 2016-06-20 (×6): 4 mg via INTRAVENOUS
  Administered 2016-06-20: 2 mg via INTRAVENOUS
  Administered 2016-06-20 (×5): 4 mg via INTRAVENOUS
  Administered 2016-06-20: 2 mg via INTRAVENOUS
  Administered 2016-06-20: 4 mg via INTRAVENOUS
  Administered 2016-06-21 (×3): 2 mg via INTRAVENOUS
  Administered 2016-06-21 – 2016-06-22 (×5): 4 mg via INTRAVENOUS
  Administered 2016-06-23: 5 mg via INTRAVENOUS
  Administered 2016-06-23: 4 mg via INTRAVENOUS
  Administered 2016-06-24: 2 mg via INTRAVENOUS
  Administered 2016-06-24 – 2016-06-25 (×4): 4 mg via INTRAVENOUS
  Administered 2016-06-30: 2 mg via INTRAVENOUS
  Administered 2016-07-08: 4 mg via INTRAVENOUS
  Administered 2016-07-08: 2 mg via INTRAVENOUS
  Filled 2016-06-19 (×3): qty 1
  Filled 2016-06-19: qty 2
  Filled 2016-06-19 (×29): qty 1

## 2016-06-19 MED ORDER — HEPARIN (PORCINE) IN NACL 100-0.45 UNIT/ML-% IJ SOLN
900.0000 [IU]/h | INTRAMUSCULAR | Status: DC
  Administered 2016-06-19: 900 [IU]/h via INTRAVENOUS
  Filled 2016-06-19 (×2): qty 250

## 2016-06-19 MED ORDER — GUAIFENESIN-DM 100-10 MG/5ML PO SYRP: 15.0000 mL | ORAL_SOLUTION | ORAL | Status: DC | PRN

## 2016-06-19 MED ORDER — ONDANSETRON HCL 4 MG/2ML IJ SOLN
4.0000 mg | Freq: Four times a day (QID) | INTRAMUSCULAR | Status: DC | PRN
  Administered 2016-06-21: 4 mg via INTRAVENOUS

## 2016-06-19 MED ORDER — LISINOPRIL-HYDROCHLOROTHIAZIDE 10-12.5 MG PO TABS: 1.0000 | ORAL_TABLET | Freq: Every day | ORAL | Status: DC

## 2016-06-19 MED ORDER — MORPHINE SULFATE (PF) 4 MG/ML IV SOLN
4.0000 mg | Freq: Once | INTRAVENOUS | Status: AC
  Administered 2016-06-19: 4 mg via INTRAVENOUS
  Filled 2016-06-19: qty 1

## 2016-06-19 MED ORDER — ACETAMINOPHEN 325 MG PO TABS: 325.0000 mg | ORAL_TABLET | ORAL | Status: DC | PRN

## 2016-06-19 MED ORDER — PHENOL 1.4 % MT LIQD: 1.0000 | OROMUCOSAL | Status: DC | PRN

## 2016-06-19 MED ORDER — ACETAMINOPHEN 325 MG RE SUPP: 325.0000 mg | RECTAL | Status: DC | PRN

## 2016-06-19 MED ORDER — HYDROCODONE-ACETAMINOPHEN 5-325 MG PO TABS: 1.0000 | ORAL_TABLET | Freq: Once | ORAL | Status: DC

## 2016-06-19 MED ORDER — HEPARIN BOLUS VIA INFUSION
4000.0000 [IU] | Freq: Once | INTRAVENOUS | Status: AC
  Administered 2016-06-19: 4000 [IU] via INTRAVENOUS
  Filled 2016-06-19: qty 4000

## 2016-06-19 MED ORDER — HYDRALAZINE HCL 20 MG/ML IJ SOLN: 5.0000 mg | INTRAMUSCULAR | Status: DC

## 2016-06-19 MED ORDER — METOPROLOL TARTRATE 5 MG/5ML IV SOLN
2.0000 mg | INTRAVENOUS | Status: DC | PRN
  Administered 2016-06-21: 5 mg via INTRAVENOUS
  Filled 2016-06-19: qty 5

## 2016-06-19 MED ORDER — HYDROCHLOROTHIAZIDE 12.5 MG PO CAPS
12.5000 mg | ORAL_CAPSULE | Freq: Every day | ORAL | Status: DC
  Administered 2016-06-20: 12.5 mg via ORAL
  Filled 2016-06-19 (×2): qty 1

## 2016-06-19 MED ORDER — OXYCODONE-ACETAMINOPHEN 5-325 MG PO TABS
1.0000 | ORAL_TABLET | ORAL | Status: DC | PRN
  Administered 2016-06-20 – 2016-06-26 (×9): 2 via ORAL
  Administered 2016-06-26: 1 via ORAL
  Administered 2016-06-26 – 2016-06-28 (×5): 2 via ORAL
  Administered 2016-06-28 (×2): 1 via ORAL
  Administered 2016-06-29 (×4): 2 via ORAL
  Administered 2016-06-29: 1 via ORAL
  Administered 2016-06-30 – 2016-07-06 (×6): 2 via ORAL
  Administered 2016-07-06 – 2016-07-07 (×2): 1 via ORAL
  Administered 2016-07-07 – 2016-07-08 (×3): 2 via ORAL
  Administered 2016-07-08 – 2016-07-09 (×5): 1 via ORAL
  Administered 2016-07-10: 2 via ORAL
  Filled 2016-06-19 (×3): qty 2
  Filled 2016-06-19: qty 1
  Filled 2016-06-19: qty 2
  Filled 2016-06-19 (×2): qty 1
  Filled 2016-06-19 (×3): qty 2
  Filled 2016-06-19: qty 1
  Filled 2016-06-19 (×4): qty 2
  Filled 2016-06-19: qty 1
  Filled 2016-06-19: qty 2
  Filled 2016-06-19 (×2): qty 1
  Filled 2016-06-19: qty 2
  Filled 2016-06-19: qty 1
  Filled 2016-06-19: qty 2
  Filled 2016-06-19: qty 1
  Filled 2016-06-19 (×11): qty 2
  Filled 2016-06-19: qty 1
  Filled 2016-06-19 (×3): qty 2
  Filled 2016-06-19: qty 1

## 2016-06-19 MED ORDER — HYDRALAZINE HCL 20 MG/ML IJ SOLN
10.0000 mg | Freq: Once | INTRAMUSCULAR | Status: AC
  Administered 2016-06-19: 10 mg via INTRAVENOUS
  Filled 2016-06-19: qty 1

## 2016-06-19 MED ORDER — ATORVASTATIN CALCIUM 40 MG PO TABS
40.0000 mg | ORAL_TABLET | Freq: Every day | ORAL | Status: DC
  Administered 2016-06-20 – 2016-06-25 (×4): 40 mg via ORAL
  Filled 2016-06-19 (×4): qty 1

## 2016-06-19 MED ORDER — SODIUM CHLORIDE 0.9 % IV SOLN
INTRAVENOUS | Status: DC
  Administered 2016-06-19: 19:00:00 via INTRAVENOUS

## 2016-06-19 MED ORDER — HYDRALAZINE HCL 20 MG/ML IJ SOLN
5.0000 mg | INTRAMUSCULAR | Status: AC | PRN
  Administered 2016-06-19 (×2): 5 mg via INTRAVENOUS
  Filled 2016-06-19 (×2): qty 1

## 2016-06-19 MED ORDER — PANTOPRAZOLE SODIUM 40 MG PO TBEC
40.0000 mg | DELAYED_RELEASE_TABLET | Freq: Every day | ORAL | Status: DC
  Administered 2016-06-19 – 2016-07-10 (×20): 40 mg via ORAL
  Filled 2016-06-19 (×20): qty 1

## 2016-06-19 MED ORDER — ALUM & MAG HYDROXIDE-SIMETH 200-200-20 MG/5ML PO SUSP: 15.0000 mL | ORAL | Status: DC | PRN

## 2016-06-19 MED ORDER — VERAPAMIL HCL ER 120 MG PO TBCR
120.0000 mg | EXTENDED_RELEASE_TABLET | Freq: Every day | ORAL | Status: DC
  Administered 2016-06-19 – 2016-06-20 (×2): 120 mg via ORAL
  Filled 2016-06-19 (×2): qty 1

## 2016-06-19 MED ORDER — ASPIRIN 325 MG PO TABS
325.0000 mg | ORAL_TABLET | Freq: Every day | ORAL | Status: DC
  Administered 2016-06-19 – 2016-06-27 (×6): 325 mg via ORAL
  Filled 2016-06-19 (×7): qty 1

## 2016-06-19 NOTE — Consult Note (Signed)
[facility] [place]  Patient name: Stephen Bowen MRN: [id number] DOB: [birth year] Sex: male  REASON FOR CONSULT: ischemic left foot, consult is from Dr. Donetta Potts (EDP)  HPI: Stephen Bowen is a 60 y.o. male, who presents with two week history of progressively worsening left foot pain. He stated that he suddenly had a sharp pain in his left foot two weeks ago while in the kitchen. The patient is s/p left femoral to PT artery bypass with propaten on 02/16/16 with Dr. Randie Heinz. This was done for rest pain of his left foot. He decided to come into the ED today because he could no longer tolerate the pain. He says that his pain is not any different today than it was a few days ago.   The patient stated that he never started his coumadin because he had no ride for INR follow-up. He continues to smoke one pack a day. He denies any pain prior to two weeks ago. He did have residual numbness. He denies any pain in his right foot. Dr. Randie Heinz planned on future right leg bypass once he recovered from his first bypass. The patient never showed up to any office appointments.   His arteriogram on 02/15/16 showed occlusion of left SFA with reconstitution of distal SFA and occlusion of popliteal artery. There was reconstitution of the PT artery after takeoff of the TP trunk. He was placed on heparin bridge to coumadin post-operatively to maintain bypass patency.   His past medical history includes hypertension that is not well managed. He is not taking any blood pressure medications. He is not taking his statin. He does take aspirin daily. He denies any chest pain, chest pressure or shortness of breath. He last ate last night at dinner and had a soda this morning around 0800. He has not had anything since.      Past Medical History:  Diagnosis Date  . HTN (hypertension)   . PAD (peripheral artery disease) (HCC)     Family History  Problem Relation Age of Onset  . Cancer Mother    deceased in 55s   . Heart attack Mother     diseased  . Heart disease Father   . Stroke Sister     age 109s  . Heart disease Brother     pacemaker in his 39s    SOCIAL HISTORY: Social History   Social History  . Marital status: Single    Spouse name: N/A  . Number of children: N/A  . Years of education: N/A   Occupational History  . Unemployed    Social History Main Topics  . Smoking status: Current Every Day Smoker    Packs/day: 0.50    Years: 40.00    Types: Cigarettes  . Smokeless tobacco: Never Used     Comment: cutting back amount he is buying  . Alcohol use 8.4 oz/week    14 Cans of beer per week     Comment: Drinks a 40 oz "tallboy" daily  . Drug use: No  . Sexual activity: Yes    Birth control/ protection: Condom   Other Topics Concern  . Not on file   Social History Narrative   Smoking cigarettes since 60 y.o   Drinks beer 24 oz every other day        No Known Allergies  Current Facility-Administered Medications  Medication Dose Route Frequency Provider Last Rate Last Dose  . heparin ADULT infusion 100 units/mL (25000 units/266mL sodium chloride  0.45%)  900 Units/hr Intravenous Continuous Madolyn FriezeVijay Nagpal, MD 9 mL/hr at 06/19/16 1614 900 Units/hr at 06/19/16 1614   Current Outpatient Prescriptions  Medication Sig Dispense Refill  . aspirin 325 MG tablet Take 1 tablet (325 mg total) by mouth daily. 30 tablet 0  . atorvastatin (LIPITOR) 40 MG tablet Take 1 tablet (40 mg total) by mouth daily at 6 PM. 30 tablet 0  . bisacodyl (DULCOLAX) 5 MG EC tablet Take 1 tablet (5 mg total) by mouth daily as needed for moderate constipation. 30 tablet 0  . docusate sodium (COLACE) 100 MG capsule Take 1 capsule (100 mg total) by mouth daily. 10 capsule 0  . enoxaparin (LOVENOX) 100 MG/ML injection Inject 0.85 mLs (85 mg total) into the skin daily. 8.5 mL 1  . HYDROcodone-acetaminophen (NORCO/VICODIN) 5-325 MG tablet Take 1-2 tablets by mouth every 4 (four) hours as needed  for moderate pain. 30 tablet 0  . lisinopril-hydrochlorothiazide (ZESTORETIC) 10-12.5 MG tablet Take 1 tablet by mouth daily. 30 tablet 1  . nicotine (NICODERM CQ - DOSED IN MG/24 HOURS) 21 mg/24hr patch Place 1 patch (21 mg total) onto the skin daily. 30 patch 3  . nicotine polacrilex (NICORETTE) 4 MG gum Take 1 each (4 mg total) by mouth as needed for smoking cessation. 100 tablet 0  . pantoprazole (PROTONIX) 40 MG tablet Take 1 tablet (40 mg total) by mouth daily. 30 tablet 0  . selenium sulfide (SELSUN) 1 % LOTN Put on your face in the shower and wash off daily 1 Bottle 3  . verapamil (CALAN-SR) 120 MG CR tablet Take 1 tablet (120 mg total) by mouth daily. 30 tablet 11  . warfarin (COUMADIN) 5 MG tablet Take 1 tablet (5 mg total) by mouth daily. 30 tablet 0    REVIEW OF SYSTEMS:  [X]  denotes positive finding, [ ]  denotes negative finding Cardiac  Comments:  Chest pain or chest pressure:    Shortness of breath upon exertion:    Short of breath when lying flat:    Irregular heart rhythm:        Vascular    Pain in calf, thigh, or hip brought on by ambulation: x Left leg  Pain in feet at night that wakes you up from your sleep:  x Left foot  Blood clot in your veins:    Leg swelling:  x       Pulmonary    Oxygen at home:    Productive cough:     Wheezing:         Neurologic    Sudden weakness in arms or legs:     Sudden numbness in arms or legs:  x   Sudden onset of difficulty speaking or slurred speech:    Temporary loss of vision in one eye:     Problems with dizziness:         Gastrointestinal    Blood in stool:     Vomited blood:         Genitourinary    Burning when urinating:     Blood in urine:        Psychiatric    Major depression:         Hematologic    Bleeding problems:    Problems with blood clotting too easily:        Skin    Rashes or ulcers:        Constitutional    Fever or chills:      PHYSICAL  EXAM: Vitals:   06/19/16 1429 06/19/16 1445  06/19/16 1515 06/19/16 1530  BP: (!) 220/150 (!) 217/146 (!) 234/147 (!) 221/149  Pulse: (!) 130 (!) 121 117 (!) 121  Resp: 16 14 12 12   Temp:      TempSrc:      SpO2: 97% 97% 97% 97%    GENERAL: The patient is a well-nourished male, in no acute distress. The vital signs are documented above. CARDIAC: Sinus tachycardia. No carotid bruits.  VASCULAR: Palpable radial pulses bilaterally. Palpable femoral pulses bilaterally. Non palpable pedal pulses. No doppler flow left foot. Left foot is tender to touch with paler than compared to right. Sensory and motor  Right peroneal and DP doppler flow.  PULMONARY: There is good air exchange bilaterally without wheezing or rales. MUSCULOSKELETAL: No deformities.  NEUROLOGIC: Sensory function intact to both feet. Moving all extremities.  SKIN: There are no ulcers or rashes noted. PSYCHIATRIC: The patient has a normal affect.  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 Will admit to our service with plans for arteriogram and possible thrombolysis tomorrow. The patient understands that he is at very high risk for amputation. He does not have any vein available for redo bypass. Continue heparin drip. NPO past midnight.      Maris Berger, PA-C [facility] [place] [phone]   I agree with the above.  I have seen and examined the patient.  Briefly, this is a 60 year old male who initially presented in February with claudication and was treated medically.  He then showed up in August with left leg rest pain.  After angiography revealed SFA/POP occlusion with PT reconstitution, he underwent a left CFA endarterectomy with bovine pericardial patch angioplasty followed by PT bypass with Propatent graft and VPA.  He was discharged on Coumadin for graft patency, however he did not take this, and he did not show up  for any post-operative clinic visits.  He came to the ER today with a 2 week history of recurrent left leg pain.  On exam, he has intact motor and sensory function, but no doppler signals in his left foot.  His bypass is presumed to be occluded.  I placed him on a heparin drip overnight with plans for angiography and thrombolysis tomorrow as an attempt at BPG salvage.  I discussed with him that he is at very hight risk of amputation given the limited duration of bypass patency.  I stressed the need for medication compliance if we are able to re-open his bypass.  I also stressed the importance of smoking cessation and HTN medication compliance.  He is to be admitted to the step down unit as his blood pressure is too high to goto the floor.  He will be NPO after midnight.  Durene Cal

## 2016-06-19 NOTE — Progress Notes (Signed)
ANTICOAGULATION CONSULT NOTE - Initial Consult  Pharmacy Consult for heparin Indication: arterial occlusion  No Known Allergies  Patient Measurements:   Heparin Dosing Weight:   Vital Signs: Temp: 98.8 F (37.1 C) (12/26 1255) Temp Source: Oral (12/26 1255) BP: 221/149 (12/26 1530) Pulse Rate: 121 (12/26 1530)  Labs:  Recent Labs  06/19/16 1301 06/19/16 1535  HGB 15.3  --   HCT 44.5  --   PLT 391  --   LABPROT  --  12.4  INR  --  0.92  CREATININE 0.96  --     CrCl cannot be calculated (Unknown ideal weight.).   Medical History: Past Medical History:  Diagnosis Date  . HTN (hypertension)   . PAD (peripheral artery disease) (HCC)     Medications:  Infusions:  . heparin      Assessment: 60 yom s/p L femoral posterior tibial artery bypass in August presented with foot pain. Pt was prescribed warfarin but has not been taking. Baseline H/H, platelets and INR are all WNL. To start heparin for anticoagulation.   Goal of Therapy:  Heparin level 0.3-0.7 units/ml Monitor platelets by anticoagulation protocol: Yes   Plan:  Heparin bolus 4000 units IV x 1 Heparin gtt 900 units/hr Check a 6 hr heparin level Daily heparin level and CBC  Stephen Bowen, Stephen Bowen 06/19/2016,4:11 PM

## 2016-06-19 NOTE — ED Notes (Signed)
Brought patient back to room via wheelchair; patient placed on continuous pulse oximetry and blood pressure cuff

## 2016-06-19 NOTE — ED Provider Notes (Signed)
[facility]-EMERGENCY DEPT Provider Note   CSN: [id number] Arrival date & time: 06/19/16  1230     History   Chief Complaint Chief Complaint  Patient presents with  . Foot Pain    HPI Stephen Bowen is a 60 y.o. male.  The history is provided by the patient and medical records. No language interpreter was used.     This is a 60 year old male the past medical history of hypertension, peripheral artery disease status post femoral popliteal bypass of the left lower extremity in August 2017 who presents today with 1 week of increasing left leg pain. Patient states that he's been off his Coumadin for 3 months because he's been unable to get it filled. He denies any chest pain, shortness of breath, abdominal pain, nausea, vomiting, diarrhea. He has not any recent fevers, chills. States that his pain is getting to be unbearable now. Walking makes his pain much worse. Patient also states he has a history of hypertension. Review of vascular notes at time of vascular surgery shows that the patient was started on Coumadin to maintain patency.  Past Medical History:  Diagnosis Date  . HTN (hypertension)   . PAD (peripheral artery disease) [facility])     Patient Active Problem List   Diagnosis Date Noted  . 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 Preoperative cardiovascular examination   . PAD (peripheral artery disease) (HCC) 02/13/2016  . Leg pain 02/13/2016  . Hyperkalemia 02/13/2016  . Lactic acid increased 02/13/2016  . Uncontrolled hypertension 02/13/2016  . Seborrheic dermatitis 08/31/2015  . Actinic keratosis of multiple sites of head and neck 08/31/2015  . Tobacco use disorder 08/31/2015  . Skin ulcer of scalp (HCC) 08/31/2015  . Atherosclerosis of native arteries of extremity with intermittent claudication (HCC) 07/07/2015  . Hypertensive urgency 11/13/2013    Past Surgical History:  Procedure Laterality Date  . ENDARTERECTOMY FEMORAL Left 02/16/2016   Procedure: LEFT FEMORAL ENDARTERECTOMY;  Surgeon: Maeola Harman, MD;  Location[facility] OR;  Service: Vascular;  Laterality: Left;  . FEMORAL BYPASS  02/16/2016   LEFT FEMORAL-POSTERIOR TIBIAL ARTERY BYPASS  WITH PROPATEN 6 MM X 80 CM VASCULAR RING GRAFT (Left)  . FEMORAL-TIBIAL BYPASS GRAFT Left 02/16/2016   Procedure: LEFT FEMORAL-POSTERIOR TIBIAL ARTERY BYPASS  WITH PROPATEN 6 MM X 80 CM VASCULAR RING GRAFT;  Surgeon: Maeola Harman, MD;  Location: [facility] OR;  Service: Vascular;  Laterality: Left;  . PATCH ANGIOPLASTY Left 02/16/2016   Procedure: PATCH ANGIOPLASTY WITH Livia Snellen BIOLOGIC PATCH 1 CM X 6 CM;  Surgeon: Maeola Harman, MD;  Location: [facility] OR;  Service: Vascular;  Laterality: Left;  . PERIPHERAL VASCULAR CATHETERIZATION N/A 07/07/2015   Procedure: Abdominal Aortogram w/Lower Extremity;  Surgeon: Fransisco Hertz, MD;  Location: [facility] INVASIVE CV LAB;  Service: Cardiovascular;  Laterality: N/A;  . PERIPHERAL VASCULAR CATHETERIZATION N/A 02/15/2016   Procedure: Abdominal Aortogram w/Lower Extremity;  Surgeon: Maeola Harman, MD;  Location: [facility] INVASIVE CV LAB;  Service: Cardiovascular;  Laterality: N/A;  . 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       Home Medications    Prior to Admission medications   Medication Sig Start Date End Date Taking? Authorizing Provider  aspirin 325 MG tablet Take 1 tablet (325 mg total) by mouth daily. Patient not taking: Reported on 06/19/2016 02/18/16   Mir Vergie Living, MD  atorvastatin (LIPITOR) 40 MG tablet Take 1 tablet (40 mg total)  by mouth daily at 6 PM. Patient not taking: Reported on 06/19/2016 02/18/16   Mir Vergie LivingMohammed Ikramullah, MD  bisacodyl (DULCOLAX) 5 MG EC tablet Take 1 tablet (5 mg total) by mouth daily as needed for moderate constipation. Patient not taking: Reported on 06/19/2016 02/18/16   Mir Vergie LivingMohammed Ikramullah, MD  docusate sodium (COLACE) 100 MG capsule Take  1 capsule (100 mg total) by mouth daily. Patient not taking: Reported on 06/19/2016 02/18/16   Mir Vergie LivingMohammed Ikramullah, MD  enoxaparin (LOVENOX) 100 MG/ML injection Inject 0.85 mLs (85 mg total) into the skin daily. 02/19/16 02/29/16  Mir Vergie LivingMohammed Ikramullah, MD  HYDROcodone-acetaminophen (NORCO/VICODIN) 5-325 MG tablet Take 1-2 tablets by mouth every 4 (four) hours as needed for moderate pain. Patient not taking: Reported on 06/19/2016 02/18/16   Mir Vergie LivingMohammed Ikramullah, MD  lisinopril-hydrochlorothiazide (ZESTORETIC) 10-12.5 MG tablet Take 1 tablet by mouth daily. Patient not taking: Reported on 06/19/2016 06/29/15   Melton KrebsSamantha Nicole Riley, PA-C  nicotine (NICODERM CQ - DOSED IN MG/24 HOURS) 21 mg/24hr patch Place 1 patch (21 mg total) onto the skin daily. Patient not taking: Reported on 06/19/2016 08/31/15   Selina CooleyKyle Flores, MD  nicotine polacrilex (NICORETTE) 4 MG gum Take 1 each (4 mg total) by mouth as needed for smoking cessation. Patient not taking: Reported on 06/19/2016 08/31/15   Selina CooleyKyle Flores, MD  pantoprazole (PROTONIX) 40 MG tablet Take 1 tablet (40 mg total) by mouth daily. Patient not taking: Reported on 06/19/2016 02/18/16   Mir Vergie LivingMohammed Ikramullah, MD  selenium sulfide (SELSUN) 1 % LOTN Put on your face in the shower and wash off daily Patient not taking: Reported on 06/19/2016 08/31/15   Selina CooleyKyle Flores, MD  verapamil (CALAN-SR) 120 MG CR tablet Take 1 tablet (120 mg total) by mouth daily. Patient not taking: Reported on 06/19/2016 08/31/15 08/30/16  Selina CooleyKyle Flores, MD  warfarin (COUMADIN) 5 MG tablet Take 1 tablet (5 mg total) by mouth daily. Patient not taking: Reported on 06/19/2016 02/19/16   Mir Vergie LivingMohammed Ikramullah, MD    Family History Family History  Problem Relation Age of Onset  . Cancer Mother     deceased in 3570s   . Heart attack Mother     diseased  . Heart disease Father   . Stroke Sister     age 6350s  . Heart disease Brother     pacemaker in his 5150s    Social History Social History    Substance Use Topics  . Smoking status: Current Every Day Smoker    Packs/day: 0.50    Years: 40.00    Types: Cigarettes  . Smokeless tobacco: Never Used     Comment: cutting back amount he is buying  . Alcohol use 8.4 oz/week    14 Cans of beer per week     Comment: Drinks a 40 oz "tallboy" daily     Allergies   Patient has no known allergies.   Review of Systems Review of Systems  Constitutional: Negative for chills and fever.  HENT: Negative for ear pain and sore throat.   Respiratory: Negative for cough and shortness of breath.   Cardiovascular: Negative for chest pain and palpitations.  Gastrointestinal: Negative for abdominal pain and vomiting.  Genitourinary: Negative for dysuria and hematuria.  Musculoskeletal: Positive for gait problem (due to L leg pain) and myalgias (L lower leg, worsening). Negative for arthralgias and back pain.  Skin: Positive for color change (worsening redness to LLE). Negative for rash.  Neurological: Negative for seizures, syncope and  numbness.  All other systems reviewed and are negative.    Physical Exam Updated Vital Signs BP (!) 177/109   Pulse (!) 110   Temp 98.8 F (37.1 C) (Oral)   Resp 11   SpO2 97%   Physical Exam  Constitutional: No distress.  HENT:  Head: Normocephalic and atraumatic.  Eyes: Conjunctivae are normal.  Neck: Neck supple.  Cardiovascular: Regular rhythm.  Tachycardia present.   No murmur heard. Pulmonary/Chest: Effort normal and breath sounds normal. No respiratory distress.  Abdominal: Soft. There is no tenderness.  Musculoskeletal: He exhibits edema (mild pitting edema to LLE) and tenderness (LLE).  LLE is notably cold, no palpable pulse in DP/PT distribution. Unable to doppler as well.   Neurological: He is alert.  Skin: Skin is warm and dry. He is not diaphoretic.  Psychiatric: He has a normal mood and affect.  Nursing note and vitals reviewed.    ED Treatments / Results  Labs (all labs  ordered are listed, but only abnormal results are displayed) Labs Reviewed  BASIC METABOLIC PANEL - Abnormal; Notable for the following:       Result Value   Sodium 134 (*)    Chloride 96 (*)    Glucose, Bld 114 (*)    All other components within normal limits  CBC - Abnormal; Notable for the following:    WBC 12.2 (*)    All other components within normal limits  URINALYSIS, ROUTINE W REFLEX MICROSCOPIC - Abnormal; Notable for the following:    Hgb urine dipstick SMALL (*)    Ketones, ur 5 (*)    Bacteria, UA RARE (*)    Squamous Epithelial / LPF 0-5 (*)    All other components within normal limits  MRSA PCR SCREENING  PROTIME-INR  HEPARIN LEVEL (UNFRACTIONATED)  PROTIME-INR  HEPARIN LEVEL (UNFRACTIONATED)  CBC  COMPREHENSIVE METABOLIC PANEL  I-STAT TROPOININ, ED  I-STAT CG4 LACTIC ACID, ED    EKG  EKG Interpretation  Date/Time:  Tuesday June 19 2016 13:00:03 EST Ventricular Rate:  134 PR Interval:  128 QRS Duration: 72 QT Interval:  306 QTC Calculation: 456 R Axis:   73 Text Interpretation:  Sinus tachycardia Right atrial enlargement Left ventricular hypertrophy with repolarization abnormality Cannot rule out Septal infarct , age undetermined Abnormal ECG Confirmed by RAY MD, Duwayne Heck ([id number]) on 06/19/2016 1:45:26 PM       Radiology No results found.  Procedures Procedures (including critical care time)  Medications Ordered in ED Medications  heparin ADULT infusion 100 units/mL (25000 units/222mL sodium chloride 0.45%) (900 Units/hr Intravenous Rate/Dose Verify 06/19/16 2300)  verapamil (CALAN-SR) CR tablet 120 mg (120 mg Oral Given 06/19/16 2030)  aspirin tablet 325 mg (325 mg Oral Given 06/19/16 2040)  atorvastatin (LIPITOR) tablet 40 mg (not administered)  potassium chloride SA (K-DUR,KLOR-CON) CR tablet 20-40 mEq (20 mEq Oral Not Given 06/19/16 2000)  ondansetron (ZOFRAN) injection 4 mg (not administered)  alum & mag hydroxide-simeth (MAALOX/MYLANTA)  200-200-20 MG/5ML suspension 15-30 mL (not administered)  pantoprazole (PROTONIX) EC tablet 40 mg (40 mg Oral Given 06/19/16 2040)  labetalol (NORMODYNE,TRANDATE) injection 10 mg (10 mg Intravenous Given 06/19/16 2018)  metoprolol (LOPRESSOR) injection 2-5 mg (not administered)  guaiFENesin-dextromethorphan (ROBITUSSIN DM) 100-10 MG/5ML syrup 15 mL (not administered)  phenol (CHLORASEPTIC) mouth spray 1 spray (not administered)  0.9 %  sodium chloride infusion ( Intravenous Rate/Dose Verify 06/19/16 2300)  acetaminophen (TYLENOL) tablet 325-650 mg (not administered)    Or  acetaminophen (TYLENOL) suppository 325-650 mg (not administered)  oxyCODONE-acetaminophen (PERCOCET/ROXICET) 5-325 MG per tablet 1-2 tablet (not administered)  morphine 4 MG/ML injection 2-5 mg (4 mg Intravenous Given 06/19/16 2253)  lisinopril (PRINIVIL,ZESTRIL) tablet 10 mg (not administered)    And  hydrochlorothiazide (MICROZIDE) capsule 12.5 mg (not administered)  hydrALAZINE (APRESOLINE) injection 5 mg (5 mg Intravenous Given 06/19/16 2252)  morphine 4 MG/ML injection 4 mg (4 mg Intravenous Given 06/19/16 1536)  heparin bolus via infusion 4,000 Units (4,000 Units Intravenous Bolus from Bag 06/19/16 1614)  hydrALAZINE (APRESOLINE) injection 10 mg (10 mg Intravenous Given 06/19/16 1709)  hydrALAZINE (APRESOLINE) injection 5 mg (5 mg Intravenous Given 06/19/16 1921)     Initial Impression / Assessment and Plan / ED Course  I have reviewed the triage vital signs and the nursing notes.  Pertinent labs & imaging results that were available during my care of the patient were reviewed by me and considered in my medical decision making (see chart for details).  Clinical Course   Patient is a [age] M with known PAD s/p fem to pop bypass in August 2017 who has been non-compliant with his coumadin now presenting today with painful, pulseless LLE.  3:25pm: Unable to find L DP/PT pulses by palpation or doppler. Patient has  [facility] consult placed in [facility]. Added on PT INR, Lactic acid to labs. Will give IV pain medication.   -Seen by vascular. Started on heparin bolus/gtt. He is admitted to their service for likely intervention this admission. Patient remained stable while in the ED.   Final Clinical Impressions(s) / ED Diagnoses   Final diagnoses:  Cold left foot  Arterial occlusion [facility](HCC)    New Prescriptions Current Discharge Medication List       Madolyn FriezeVijay Emeli Goguen, MD 06/19/16 [phone]    Gerhard Munchobert Lockwood, MD 06/20/16 [phone]

## 2016-06-19 NOTE — ED Triage Notes (Signed)
Pt reports to the ED for eval of left foot pain. 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 Slight swelling noted to his left leg. Denies any CP, SOB, or HA. Pt tachycardic in the 130s-140s. Pt also hypertensive. Denies being on any medication for his HTN. Denies any fevers, chills, or N/V.

## 2016-06-19 NOTE — ED Notes (Signed)
Patient placed on monitor

## 2016-06-19 NOTE — Progress Notes (Signed)
ANTICOAGULATION CONSULT NOTE  Pharmacy Consult for heparin Indication: arterial occlusion  No Known Allergies  Patient Measurements:     Vital Signs: Temp: 98.8 F (37.1 C) (12/26 1255) Temp Source: Oral (12/26 1255) BP: 177/109 (12/26 2300) Pulse Rate: 110 (12/26 2300)  Labs:  Recent Labs  06/19/16 1301 06/19/16 1535 06/19/16 2024 06/19/16 2241  HGB 15.3  --   --   --   HCT 44.5  --   --   --   PLT 391  --   --   --   LABPROT  --  12.4 12.9  --   INR  --  0.92 0.97  --   HEPARINUNFRC  --   --   --  0.60  CREATININE 0.96  --   --   --     CrCl cannot be calculated (Unknown ideal weight.).  Assessment: 60 y.o. male with ischemic L foot and likely occluded BPG for heparin  Goal of Therapy:  Heparin level 0.3-0.7 units/ml Monitor platelets by anticoagulation protocol: Yes   Plan:  Continue Heparin at current rate  Follow-up am labs.   Arvon Schreiner, Gary FleetGregory Vernon 06/19/2016,11:56 PM

## 2016-06-19 NOTE — H&P (Signed)
[facility]ist of   Patient name: Stephen Bowen MRN: [id number] DOB: [birth year] Sex: male  REASON FOR CONSULT: ischemic left foot, consult is from Dr. Donetta Potts (EDP)  HPI: Stephen Bowen is a 60 y.o. male, who presents with two week history of progressively worsening left foot pain. He stated that he suddenly had a sharp pain in his left foot two weeks ago while in the kitchen. The patient is s/p left femoral to PT artery bypass with propaten on 02/16/16 with Dr. Randie Heinz. This was done for rest pain of his left foot. He decided to come into the ED today because he could no longer tolerate the pain. He says that his pain is not any different today than it was a few days ago.   The patient stated that he never started his coumadin because he had no ride for INR follow-up. He continues to smoke one pack a day. He denies any pain prior to two weeks ago. He did have residual numbness. He denies any pain in his right foot. Dr. Randie Heinz planned on future right leg bypass once he recovered from his first bypass. The patient never showed up to any office appointments.   His arteriogram on 02/15/16 showed occlusion of left SFA with reconstitution of distal SFA and occlusion of popliteal artery. There was reconstitution of the PT artery after takeoff of the TP trunk. He was placed on heparin bridge to coumadin post-operatively to maintain bypass patency.   His past medical history includes hypertension that is not well managed. He is not taking any blood pressure medications. He is not taking his statin. He does take aspirin daily. He denies any chest pain, chest pressure or shortness of breath. He last ate last night at dinner and had a soda this morning around 0800. He has not had anything since.      Past Medical History:  Diagnosis Date  . HTN (hypertension)   . PAD (peripheral artery disease) (HCC)     Family History  Problem Relation Age of Onset  . Cancer Mother    deceased in 55s   . Heart attack Mother     diseased  . Heart disease Father   . Stroke Sister     age 109s  . Heart disease Brother     pacemaker in his 39s    SOCIAL HISTORY: Social History   Social History  . Marital status: Single    Spouse name: N/A  . Number of children: N/A  . Years of education: N/A   Occupational History  . Unemployed    Social History Main Topics  . Smoking status: Current Every Day Smoker    Packs/day: 0.50    Years: 40.00    Types: Cigarettes  . Smokeless tobacco: Never Used     Comment: cutting back amount he is buying  . Alcohol use 8.4 oz/week    14 Cans of beer per week     Comment: Drinks a 40 oz "tallboy" daily  . Drug use: No  . Sexual activity: Yes    Birth control/ protection: Condom   Other Topics Concern  . Not on file   Social History Narrative   Smoking cigarettes since 60 y.o   Drinks beer 24 oz every other day        No Known Allergies  Current Facility-Administered Medications  Medication Dose Route Frequency Provider Last Rate Last Dose  . heparin ADULT infusion 100 units/mL (25000 units/266mL sodium chloride  0.45%)  900 Units/hr Intravenous Continuous Madolyn FriezeVijay Nagpal, MD 9 mL/hr at 06/19/16 1614 900 Units/hr at 06/19/16 1614   Current Outpatient Prescriptions  Medication Sig Dispense Refill  . aspirin 325 MG tablet Take 1 tablet (325 mg total) by mouth daily. 30 tablet 0  . atorvastatin (LIPITOR) 40 MG tablet Take 1 tablet (40 mg total) by mouth daily at 6 PM. 30 tablet 0  . bisacodyl (DULCOLAX) 5 MG EC tablet Take 1 tablet (5 mg total) by mouth daily as needed for moderate constipation. 30 tablet 0  . docusate sodium (COLACE) 100 MG capsule Take 1 capsule (100 mg total) by mouth daily. 10 capsule 0  . enoxaparin (LOVENOX) 100 MG/ML injection Inject 0.85 mLs (85 mg total) into the skin daily. 8.5 mL 1  . HYDROcodone-acetaminophen (NORCO/VICODIN) 5-325 MG tablet Take 1-2 tablets by mouth every 4 (four) hours as needed  for moderate pain. 30 tablet 0  . lisinopril-hydrochlorothiazide (ZESTORETIC) 10-12.5 MG tablet Take 1 tablet by mouth daily. 30 tablet 1  . nicotine (NICODERM CQ - DOSED IN MG/24 HOURS) 21 mg/24hr patch Place 1 patch (21 mg total) onto the skin daily. 30 patch 3  . nicotine polacrilex (NICORETTE) 4 MG gum Take 1 each (4 mg total) by mouth as needed for smoking cessation. 100 tablet 0  . pantoprazole (PROTONIX) 40 MG tablet Take 1 tablet (40 mg total) by mouth daily. 30 tablet 0  . selenium sulfide (SELSUN) 1 % LOTN Put on your face in the shower and wash off daily 1 Bottle 3  . verapamil (CALAN-SR) 120 MG CR tablet Take 1 tablet (120 mg total) by mouth daily. 30 tablet 11  . warfarin (COUMADIN) 5 MG tablet Take 1 tablet (5 mg total) by mouth daily. 30 tablet 0    REVIEW OF SYSTEMS:  [X]  denotes positive finding, [ ]  denotes negative finding Cardiac  Comments:  Chest pain or chest pressure:    Shortness of breath upon exertion:    Short of breath when lying flat:    Irregular heart rhythm:        Vascular    Pain in calf, thigh, or hip brought on by ambulation: x Left leg  Pain in feet at night that wakes you up from your sleep:  x Left foot  Blood clot in your veins:    Leg swelling:  x       Pulmonary    Oxygen at home:    Productive cough:     Wheezing:         Neurologic    Sudden weakness in arms or legs:     Sudden numbness in arms or legs:  x   Sudden onset of difficulty speaking or slurred speech:    Temporary loss of vision in one eye:     Problems with dizziness:         Gastrointestinal    Blood in stool:     Vomited blood:         Genitourinary    Burning when urinating:     Blood in urine:        Psychiatric    Major depression:         Hematologic    Bleeding problems:    Problems with blood clotting too easily:        Skin    Rashes or ulcers:        Constitutional    Fever or chills:      PHYSICAL  EXAM: Vitals:   06/19/16 1429 06/19/16 1445  06/19/16 1515 06/19/16 1530  BP: (!) 220/150 (!) 217/146 (!) 234/147 (!) 221/149  Pulse: (!) 130 (!) 121 117 (!) 121  Resp: 16 14 12 12   Temp:      TempSrc:      SpO2: 97% 97% 97% 97%    GENERAL: The patient is a well-nourished male, in no acute distress. The vital signs are documented above. CARDIAC: Sinus tachycardia. No carotid bruits.  VASCULAR: Palpable radial pulses bilaterally. Palpable femoral pulses bilaterally. Non palpable pedal pulses. No doppler flow left foot. Left foot is tender to touch with paler than compared to right. Sensory and motor  Right peroneal and DP doppler flow.  PULMONARY: There is good air exchange bilaterally without wheezing or rales. MUSCULOSKELETAL: No deformities.  NEUROLOGIC: Sensory function intact to both feet. Moving all extremities.  SKIN: There are no ulcers or rashes noted. PSYCHIATRIC: The patient has a normal affect.  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 Will admit to our service with plans for arteriogram and possible thrombolysis tomorrow. The patient understands that he is at very high risk for amputation. He does not have any vein available for redo bypass. Continue heparin drip. NPO past midnight.      Maris BergerKimberly Trinh, PA-C [facility] [place] [phone]   I agree with the above.  I have seen and examined the patient.  Briefly, this is a 60 year old male who initially presented in February with claudication and was treated medically.  He then showed up in August with left leg rest pain.  After angiography revealed SFA/POP occlusion with PT reconstitution, he underwent a left CFA endarterectomy with bovine pericardial patch angioplasty followed by PT bypass with Propatent graft and VPA.  He was discharged on Coumadin for graft patency, however he did not take this, and he did not show up  for any post-operative clinic visits.  He came to the ER today with a 2 week history of recurrent left leg pain.  On exam, he has intact motor and sensory function, but no doppler signals in his left foot.  His bypass is presumed to be occluded.  I placed him on a heparin drip overnight with plans for angiography and thrombolysis tomorrow as an attempt at BPG salvage.  I discussed with him that he is at very hight risk of amputation given the limited duration of bypass patency.  I stressed the need for medication compliance if we are able to re-open his bypass.  I also stressed the importance of smoking cessation and HTN medication compliance.  He is to be admitted to the step down unit as his blood pressure is too high to goto the floor.  He will be NPO after midnight.  Durene CalWells Sly Parlee

## 2016-06-19 NOTE — ED Provider Notes (Signed)
Patient not in room x 2   Margarita Grizzleanielle Ebany Bowermaster, MD 06/19/16 1415

## 2016-06-19 NOTE — ED Notes (Signed)
Attempted report x1. 

## 2016-06-20 ENCOUNTER — Ambulatory Visit (HOSPITAL_COMMUNITY): Admission: RE | Admit: 2016-06-20 | Payer: Self-pay | Source: Ambulatory Visit | Admitting: Surgery

## 2016-06-20 ENCOUNTER — Encounter (HOSPITAL_COMMUNITY)
Admission: EM | Disposition: A | Discharge status: Skilled Nursing Facility | Payer: Self-pay | Source: Home / Self Care | Attending: Surgery

## 2016-06-20 DIAGNOSIS — I70302 Unspecified atherosclerosis of unspecified type of bypass graft(s) of the extremities, left leg: Secondary | ICD-10-CM

## 2016-06-20 HISTORY — PX: PERIPHERAL VASCULAR CATHETERIZATION: SHX172C

## 2016-06-20 LAB — COMPREHENSIVE METABOLIC PANEL
ALK PHOS: 71 U/L (ref 38–126)
ALT: 10 U/L — AB (ref 17–63)
AST: 11 U/L — AB (ref 15–41)
Albumin: 3.2 g/dL — ABNORMAL LOW (ref 3.5–5.0)
Anion gap: 8 (ref 5–15)
BUN: 6 mg/dL (ref 6–20)
CALCIUM: 8.7 mg/dL — AB (ref 8.9–10.3)
CHLORIDE: 101 mmol/L (ref 101–111)
CO2: 27 mmol/L (ref 22–32)
CREATININE: 0.86 mg/dL (ref 0.61–1.24)
GFR calc Af Amer: >60 mL/min (ref >60)
GFR calc non Af Amer: >60 mL/min (ref >60)
GLUCOSE: 118 mg/dL — AB (ref 65–99)
Potassium: 3.7 mmol/L (ref 3.5–5.1)
SODIUM: 136 mmol/L (ref 135–145)
Total Bilirubin: 0.2 mg/dL — ABNORMAL LOW (ref 0.3–1.2)
Total Protein: 6.7 g/dL (ref 6.5–8.1)

## 2016-06-20 LAB — CBC
HCT: 39.9 % (ref 39.0–52.0)
HCT: 37.5 % — ABNORMAL LOW (ref 39.0–52.0)
HEMATOCRIT: 39.8 % (ref 39.0–52.0)
HEMOGLOBIN: 13.2 g/dL (ref 13.0–17.0)
HEMOGLOBIN: 12.1 g/dL — AB (ref 13.0–17.0)
Hemoglobin: 12.9 g/dL — ABNORMAL LOW (ref 13.0–17.0)
MCH: 29.4 pg (ref 26.0–34.0)
MCH: 29.3 pg (ref 26.0–34.0)
MCH: 28.9 pg (ref 26.0–34.0)
MCHC: 33.2 g/dL (ref 30.0–36.0)
MCHC: 32.3 g/dL (ref 30.0–36.0)
MCHC: 32.3 g/dL (ref 30.0–36.0)
MCV: 88.6 fL (ref 78.0–100.0)
MCV: 90.5 fL (ref 78.0–100.0)
MCV: 89.7 fL (ref 78.0–100.0)
PLATELETS: 282 10*3/uL (ref 150–400)
PLATELETS: 265 10*3/uL (ref 150–400)
Platelets: 391 10*3/uL (ref 150–400)
RBC: 4.49 MIL/uL (ref 4.22–5.81)
RBC: 4.41 MIL/uL (ref 4.22–5.81)
RBC: 4.18 MIL/uL — AB (ref 4.22–5.81)
RDW: 14.5 % (ref 11.5–15.5)
RDW: 14.8 % (ref 11.5–15.5)
RDW: 14.8 % (ref 11.5–15.5)
WBC: 9.5 10*3/uL (ref 4.0–10.5)
WBC: 10 10*3/uL (ref 4.0–10.5)
WBC: 8.4 10*3/uL (ref 4.0–10.5)

## 2016-06-20 LAB — FIBRINOGEN
FIBRINOGEN: 475 mg/dL (ref 210–475)
FIBRINOGEN: 251 mg/dL (ref 210–475)

## 2016-06-20 LAB — HEPARIN LEVEL (UNFRACTIONATED)
HEPARIN UNFRACTIONATED: 0.12 [IU]/mL — AB (ref 0.30–0.70)
Heparin Unfractionated: 0.44 IU/mL (ref 0.30–0.70)

## 2016-06-20 SURGERY — LOWER EXTREMITY ANGIOGRAPHY
Laterality: Bilateral

## 2016-06-20 MED ORDER — SODIUM CHLORIDE 0.9% FLUSH
3.0000 mL | Freq: Two times a day (BID) | INTRAVENOUS | Status: DC
  Administered 2016-06-20 – 2016-07-08 (×27): 3 mL via INTRAVENOUS

## 2016-06-20 MED ORDER — METOPROLOL TARTRATE 5 MG/5ML IV SOLN
5.0000 mg | Freq: Four times a day (QID) | INTRAVENOUS | Status: DC
  Administered 2016-06-20 – 2016-06-23 (×11): 5 mg via INTRAVENOUS
  Filled 2016-06-20 (×11): qty 5

## 2016-06-20 MED ORDER — SODIUM CHLORIDE 0.9% FLUSH: 3.0000 mL | INTRAVENOUS | Status: DC | PRN

## 2016-06-20 MED ORDER — HEPARIN (PORCINE) IN NACL 100-0.45 UNIT/ML-% IJ SOLN: 500.0000 [IU]/h | INTRAMUSCULAR | Status: DC

## 2016-06-20 MED ORDER — SODIUM CHLORIDE 0.9 % IV SOLN
INTRAVENOUS | Status: DC | PRN
  Administered 2016-06-20: 1 mg/h

## 2016-06-20 MED ORDER — HEPARIN (PORCINE) IN NACL 2-0.9 UNIT/ML-% IJ SOLN
INTRAMUSCULAR | Status: DC | PRN
  Administered 2016-06-20: 1000 mL

## 2016-06-20 MED ORDER — TENECTEPLASE 50 MG IV KIT: PACK | INTRAVENOUS | Status: DC | PRN

## 2016-06-20 MED ORDER — SODIUM CHLORIDE 0.9 % IV SOLN: 250.0000 mL | INTRAVENOUS | Status: DC | PRN

## 2016-06-20 MED ORDER — LABETALOL HCL 5 MG/ML IV SOLN
INTRAVENOUS | Status: AC
  Filled 2016-06-20: qty 4

## 2016-06-20 MED ORDER — SODIUM CHLORIDE 0.9 % IV SOLN
1.0000 mg/h | INTRAVENOUS | Status: DC
  Administered 2016-06-21: 1 mg/h
  Filled 2016-06-20: qty 10

## 2016-06-20 MED ORDER — SODIUM CHLORIDE 0.9 % IV SOLN
INTRAVENOUS | Status: DC
  Filled 2016-06-20 (×2): qty 10

## 2016-06-20 MED ORDER — CEFAZOLIN SODIUM-DEXTROSE 2-4 GM/100ML-% IV SOLN
2.0000 g | Freq: Three times a day (TID) | INTRAVENOUS | Status: AC
  Administered 2016-06-20: 2 g via INTRAVENOUS
  Filled 2016-06-20: qty 100

## 2016-06-20 MED ORDER — ORAL CARE MOUTH RINSE
15.0000 mL | Freq: Two times a day (BID) | OROMUCOSAL | Status: DC
  Administered 2016-06-20 – 2016-07-09 (×25): 15 mL via OROMUCOSAL

## 2016-06-20 MED ORDER — LIDOCAINE HCL (PF) 1 % IJ SOLN
INTRAMUSCULAR | Status: AC
  Filled 2016-06-20: qty 30

## 2016-06-20 MED ORDER — HEPARIN (PORCINE) IN NACL 2-0.9 UNIT/ML-% IJ SOLN
INTRAMUSCULAR | Status: AC
  Filled 2016-06-20: qty 1000

## 2016-06-20 MED ORDER — SODIUM CHLORIDE 0.9 % IV SOLN
INTRAVENOUS | Status: DC
  Administered 2016-06-21: 19:00:00 via INTRAVENOUS
  Administered 2016-06-22: 50 mL via INTRAVENOUS
  Administered 2016-06-24 – 2016-06-25 (×3): 50 mL/h via INTRAVENOUS
  Administered 2016-06-27: via INTRAVENOUS

## 2016-06-20 MED ORDER — LIDOCAINE HCL (PF) 1 % IJ SOLN
INTRAMUSCULAR | Status: DC | PRN
  Administered 2016-06-20: 12 mL

## 2016-06-20 MED ORDER — CLONIDINE HCL 0.1 MG PO TABS
0.2000 mg | ORAL_TABLET | ORAL | Status: DC | PRN
  Administered 2016-06-20: 0.2 mg via ORAL
  Filled 2016-06-20: qty 2

## 2016-06-20 MED ORDER — LABETALOL HCL 5 MG/ML IV SOLN
INTRAVENOUS | Status: DC | PRN
  Administered 2016-06-20: 20 mg via INTRAVENOUS

## 2016-06-20 MED ORDER — IODIXANOL 320 MG/ML IV SOLN
INTRAVENOUS | Status: DC | PRN
  Administered 2016-06-20: 100 mL via INTRAVENOUS

## 2016-06-20 SURGICAL SUPPLY — 18 items
CATH ANGIO 5F BER2 65CM (CATHETERS) ×2 IMPLANT
CATH INFUS 135CMX50CM (CATHETERS) ×2 IMPLANT
CATH OMNI FLUSH 5F 65CM (CATHETERS) ×2 IMPLANT
CATH QUICKCROSS .035X135CM (MICROCATHETER) ×2 IMPLANT
CATH STRAIGHT 5FR 65CM (CATHETERS) ×2 IMPLANT
CATH TEMPO AQUA 5F 100CM (CATHETERS) ×2 IMPLANT
DEVICE TORQUE .025-.038 (MISCELLANEOUS) ×2 IMPLANT
GUIDEWIRE ANGLED .035X150CM (WIRE) ×2 IMPLANT
GUIDEWIRE ANGLED .035X260CM (WIRE) ×2 IMPLANT
KIT MICROINTRODUCER STIFF 5F (SHEATH) ×2 IMPLANT
KIT PV (KITS) ×2 IMPLANT
SHEATH DESTINATION MP 5FR 45CM (SHEATH) ×2 IMPLANT
SHEATH PINNACLE 5F 10CM (SHEATH) ×2 IMPLANT
SYR MEDRAD MARK V 150ML (SYRINGE) ×2 IMPLANT
TRANSDUCER W/STOPCOCK (MISCELLANEOUS) ×2 IMPLANT
TRAY PV CATH (CUSTOM PROCEDURE TRAY) ×2 IMPLANT
WIRE BENTSON .035X145CM (WIRE) ×2 IMPLANT
WIRE ROSEN-J .035X260CM (WIRE) ×2 IMPLANT

## 2016-06-20 NOTE — Op Note (Signed)
    Patient name: Stephen ApleyJerome D Trippett MRN: [id number] DOB: [birth year] Sex: male  06/20/2016 Pre-operative Diagnosis: acute left lower extremity ischemia with occluded left femoral to PT bypass graft Post-operative diagnosis:  Same Surgeon:  Luanna SalkBrandon C. Randie Heinzain, MD Procedure Performed: 1.  US guided cannulation of right common femoral artery 2.  Aortogram 3.  Left lower extremity angiogram 4.  Selective cannulation of left leg bypass graft 5.  Placement of 50cm treatment length lytic catheter  Indications:  60yo male with history of a left femoral to posterior tibial artery bypass with graft and vein patch angioplasty. He now presents with 2 weeks of rest pain after having no pain following his bypass. He has a presumed occluded graft and is indicated for the above procedure.  Findings: Common femoral artery is patent with a diseased profunda and an SFA that occludes after a very short segment. There was no evidence of bypass graft in the original arteriogram. After cannulating the graft were able to get through a likely stenosis at the distal anastomosis in an angiogram demonstrated patent PT to the level of the foot without any antegrade flow. Lysis catheter was left within the graft 50 cm treatment length.   Procedure:  The patient was identified in the holding area and taken to room 8.  The patient was then placed supine on the table and prepped and draped in the usual sterile fashion.  A time out was called.  Ultrasound was used to evaluate the right common femoral artery.  It was patent .  A digital ultrasound image was acquired.  A micropuncture needle was used to access the right common femoral artery under ultrasound guidance.  An 018 wire was advanced without resistance and a micropuncture sheath was placed.  The 018 wire was removed and a benson wire was placed.  The micropuncture sheath was exchanged for a 5 french sheath.  An omniflush catheter was advanced over the wire to the level and an  abdominal angiogram was obtained.  Next, using the omniflush catheter and a glide wire, the aortic bifurcation was crossed and the catheter was placed into the left common femoral artery and we exchanged for long 5 French sheath. Patient was on heparin drip already. We then used bare catheter Glidewire to select the graft and using quick cross catheter across a likely distal anastomotic stricture. Angiogram demonstrated we were intraluminal in the PT artery. There was no antegrade flow given the occlusion of the graft. We then exchanged over wire for a 50 cm treatment length lysis catheter confirmed placement within the graft. Occlusion wire was placed patient looked up to TPA at 1 mg/h with 500 units of heparin running through the sheath. There were no immediate competitions he will be transferred to ICU for continued monitoring.    Brandon C. Randie Heinzain, MD [facility] [place] Office: [phone] Pager: [phone]

## 2016-06-20 NOTE — Progress Notes (Signed)
ANTICOAGULATION CONSULT NOTE - Follow Up Consult  Pharmacy Consult for Heparin Indication: arterial occlusion  No Known Allergies  Patient Measurements: Height: 5\' 8"  (172.7 cm) Weight: 122 lb 5.7 oz (55.5 kg) IBW/kg (Calculated) : 68.4 Heparin Dosing Weight: 55.5 kg  Vital Signs: Temp: 98.7 F (37.1 C) (12/27 0700) Temp Source: Oral (12/27 0700) BP: 149/83 (12/27 0900) Pulse Rate: 113 (12/27 0900)  Labs:  Recent Labs  06/19/16 1301 06/19/16 1535 06/19/16 2024 06/19/16 2241 06/20/16 0300  HGB 15.3  --   --   --  13.2  HCT 44.5  --   --   --  39.8  PLT 391  --   --   --  391  LABPROT  --  12.4 12.9  --   --   INR  --  0.92 0.97  --   --   HEPARINUNFRC  --   --   --  0.60 0.44  CREATININE 0.96  --   --   --  0.86    Estimated Creatinine Clearance: 71.7 mL/min (by C-G formula based on SCr of 0.86 mg/dL).  Assessment:   60 yom s/p L femoral posterior tibial artery bypass in August presented with foot pain. Pt was prescribed warfarin but was not taking it prior to admission.  IV heparin begun 12/26.    Heparin level remains therapeutic (0.44) on 900 units/hr.     For angiogram and possible lysis of graft today.  Goal of Therapy:  Heparin level 0.3-0.7 units/ml Monitor platelets by anticoagulation protocol: Yes   Plan:   Continue heparin drip at 900 units/hr.  Daily heparin level and CBC while on heparin.  Follow up post-angiogram.  Dennie FettersEgan, Taylen Osorto Donovan, RPh Pager: [phone] 06/20/2016,10:48 AM

## 2016-06-20 NOTE — Progress Notes (Signed)
  Progress Note    06/20/2016 8:27 AM * No surgery date entered *  Subjective: left foot pain improving with heparin.  Vitals:   06/20/16 0600 06/20/16 0700  BP: (!) 173/101 (!) 182/99  Pulse: (!) 108 95  Resp: (!) 8 11  Temp:  98.7 F (37.1 C)    Physical Exam: Cardiac:  rrr Lungs:  Non labored Incisions:  Well healed Extremities:  Left foot is cool, sensation and motor in tact Abdomen:  Soft, ntnd  CBC    Component Value Date/Time   WBC 9.5 06/20/2016 0300   RBC 4.49 06/20/2016 0300   HGB 13.2 06/20/2016 0300   HCT 39.8 06/20/2016 0300   PLT 391 06/20/2016 0300   MCV 88.6 06/20/2016 0300   MCH 29.4 06/20/2016 0300   MCHC 33.2 06/20/2016 0300   RDW 14.5 06/20/2016 0300   LYMPHSABS 1.1 02/13/2016 1519   MONOABS 0.6 02/13/2016 1519   EOSABS 0.0 02/13/2016 1519   BASOSABS 0.0 02/13/2016 1519    BMET    Component Value Date/Time   NA 136 06/20/2016 0300   K 3.7 06/20/2016 0300   CL 101 06/20/2016 0300   CO2 27 06/20/2016 0300   GLUCOSE 118 (H) 06/20/2016 0300   BUN 6 06/20/2016 0300   CREATININE 0.86 06/20/2016 0300   CREATININE 0.92 11/13/2013 1031   CALCIUM 8.7 (L) 06/20/2016 0300   GFRNONAA >60 06/20/2016 0300   GFRNONAA >89 11/13/2013 1031   GFRAA >60 06/20/2016 0300   GFRAA >89 11/13/2013 1031    INR    Component Value Date/Time   INR 0.97 06/19/2016 2024     Intake/Output Summary (Last 24 hours) at 06/20/16 0827 Last data filed at 06/20/16 0700  Gross per 24 hour  Intake           1452.9 ml  Output              500 ml  Net            952.9 ml     Assessment:  60 y.o. male is s/p left fem-pt bypass now occluded with return of severe rest pain.  Plan: Angiogram with possible lysis of left fem-pt graft   Marjo Grosvenor C. Randie Heinzain, MD [facility] [place] Office: [phone] Pager: [phone]  06/20/2016 8:27 AM

## 2016-06-21 ENCOUNTER — Encounter (HOSPITAL_COMMUNITY): Payer: Self-pay

## 2016-06-21 ENCOUNTER — Encounter (HOSPITAL_COMMUNITY)
Admission: EM | Disposition: A | Discharge status: Skilled Nursing Facility | Payer: Self-pay | Source: Home / Self Care | Attending: Surgery

## 2016-06-21 ENCOUNTER — Encounter (HOSPITAL_COMMUNITY): Payer: Self-pay | Admitting: Vascular Surgery

## 2016-06-21 ENCOUNTER — Inpatient Hospital Stay (HOSPITAL_COMMUNITY): Payer: Medicaid Other

## 2016-06-21 ENCOUNTER — Inpatient Hospital Stay (HOSPITAL_COMMUNITY): Payer: Medicaid Other | Admitting: Anesthesiology

## 2016-06-21 DIAGNOSIS — I2109 ST elevation (STEMI) myocardial infarction involving other coronary artery of anterior wall: Secondary | ICD-10-CM

## 2016-06-21 DIAGNOSIS — I959 Hypotension, unspecified: Secondary | ICD-10-CM

## 2016-06-21 DIAGNOSIS — I639 Cerebral infarction, unspecified: Secondary | ICD-10-CM

## 2016-06-21 HISTORY — PX: EMBOLECTOMY: SHX44

## 2016-06-21 HISTORY — PX: BYPASS GRAFT POPLITEAL TO TIBIAL: SHX5764

## 2016-06-21 LAB — CBC
HCT: 28.2 % — ABNORMAL LOW (ref 39.0–52.0)
HEMATOCRIT: 36.3 % — AB (ref 39.0–52.0)
Hemoglobin: 11.7 g/dL — ABNORMAL LOW (ref 13.0–17.0)
Hemoglobin: 9.6 g/dL — ABNORMAL LOW (ref 13.0–17.0)
MCH: 28.9 pg (ref 26.0–34.0)
MCH: 29.4 pg (ref 26.0–34.0)
MCHC: 32.2 g/dL (ref 30.0–36.0)
MCHC: 34 g/dL (ref 30.0–36.0)
MCV: 89.6 fL (ref 78.0–100.0)
MCV: 86.5 fL (ref 78.0–100.0)
PLATELETS: 157 10*3/uL (ref 150–400)
Platelets: 236 10*3/uL (ref 150–400)
RBC: 4.05 MIL/uL — ABNORMAL LOW (ref 4.22–5.81)
RBC: 3.26 MIL/uL — AB (ref 4.22–5.81)
RDW: 14.7 % (ref 11.5–15.5)
RDW: 14.5 % (ref 11.5–15.5)
WBC: 8.2 10*3/uL (ref 4.0–10.5)
WBC: 15 10*3/uL — ABNORMAL HIGH (ref 4.0–10.5)

## 2016-06-21 LAB — BASIC METABOLIC PANEL
Anion gap: 8 (ref 5–15)
BUN: 6 mg/dL (ref 6–20)
CHLORIDE: 99 mmol/L — AB (ref 101–111)
CO2: 25 mmol/L (ref 22–32)
CREATININE: 0.82 mg/dL (ref 0.61–1.24)
Calcium: 8.3 mg/dL — ABNORMAL LOW (ref 8.9–10.3)
GFR calc Af Amer: >60 mL/min (ref >60)
GFR calc non Af Amer: >60 mL/min (ref >60)
GLUCOSE: 107 mg/dL — AB (ref 65–99)
Potassium: 3.7 mmol/L (ref 3.5–5.1)
Sodium: 132 mmol/L — ABNORMAL LOW (ref 135–145)

## 2016-06-21 LAB — FIBRINOGEN: Fibrinogen: 110 mg/dL — ABNORMAL LOW (ref 210–475)

## 2016-06-21 LAB — HEPARIN LEVEL (UNFRACTIONATED): Heparin Unfractionated: <0.1 IU/mL — ABNORMAL LOW (ref 0.30–0.70)

## 2016-06-21 LAB — POCT I-STAT 4, (NA,K, GLUC, HGB,HCT)
GLUCOSE: 115 mg/dL — AB (ref 65–99)
HCT: 23 % — ABNORMAL LOW (ref 39.0–52.0)
Hemoglobin: 7.8 g/dL — ABNORMAL LOW (ref 13.0–17.0)
POTASSIUM: 4.2 mmol/L (ref 3.5–5.1)
Sodium: 134 mmol/L — ABNORMAL LOW (ref 135–145)

## 2016-06-21 LAB — TROPONIN I: TROPONIN I: 36.56 ng/mL — AB (ref <0.03)

## 2016-06-21 LAB — ABO/RH: ABO/RH(D): A POS

## 2016-06-21 SURGERY — LOWER EXTREMITY ANGIOGRAPHY
Anesthesia: LOCAL

## 2016-06-21 SURGERY — LEFT HEART CATH AND CORONARY ANGIOGRAPHY

## 2016-06-21 SURGERY — EMBOLECTOMY
Anesthesia: General | Site: Leg Lower | Laterality: Left

## 2016-06-21 MED ORDER — IOPAMIDOL (ISOVUE-300) INJECTION 61%
INTRAVENOUS | Status: DC | PRN
  Administered 2016-06-21: 100 mL

## 2016-06-21 MED ORDER — SUGAMMADEX SODIUM 200 MG/2ML IV SOLN
INTRAVENOUS | Status: AC
  Filled 2016-06-21: qty 2

## 2016-06-21 MED ORDER — CALCIUM CHLORIDE 10 % IV SOLN
INTRAVENOUS | Status: DC | PRN
  Administered 2016-06-21 (×2): 100 mg via INTRAVENOUS

## 2016-06-21 MED ORDER — HEPARIN (PORCINE) IN NACL 100-0.45 UNIT/ML-% IJ SOLN
250.0000 [IU]/h | INTRAMUSCULAR | Status: DC
  Administered 2016-06-21: 250 [IU]/h via INTRAVENOUS
  Filled 2016-06-21: qty 250

## 2016-06-21 MED ORDER — PHENYLEPHRINE HCL 10 MG/ML IJ SOLN
INTRAMUSCULAR | Status: AC
  Filled 2016-06-21: qty 1

## 2016-06-21 MED ORDER — PHENYLEPHRINE HCL 10 MG/ML IJ SOLN
INTRAMUSCULAR | Status: DC | PRN
  Administered 2016-06-21: 50 ug/min via INTRAVENOUS

## 2016-06-21 MED ORDER — HEPARIN SODIUM (PORCINE) 1000 UNIT/ML IJ SOLN
INTRAMUSCULAR | Status: AC
  Filled 2016-06-21: qty 2

## 2016-06-21 MED ORDER — FENTANYL CITRATE (PF) 100 MCG/2ML IJ SOLN
INTRAMUSCULAR | Status: AC
  Filled 2016-06-21: qty 4

## 2016-06-21 MED ORDER — HYDROMORPHONE HCL 1 MG/ML IJ SOLN: 0.2500 mg | INTRAMUSCULAR | Status: DC | PRN

## 2016-06-21 MED ORDER — PROTAMINE SULFATE 10 MG/ML IV SOLN
INTRAVENOUS | Status: DC | PRN
  Administered 2016-06-21 (×2): 10 mg via INTRAVENOUS

## 2016-06-21 MED ORDER — CEFAZOLIN SODIUM 1 G IJ SOLR
INTRAMUSCULAR | Status: AC
  Filled 2016-06-21: qty 20

## 2016-06-21 MED ORDER — LACTATED RINGERS IV SOLN
INTRAVENOUS | Status: DC | PRN
  Administered 2016-06-21 (×3): via INTRAVENOUS

## 2016-06-21 MED ORDER — ROCURONIUM BROMIDE 100 MG/10ML IV SOLN
INTRAVENOUS | Status: DC | PRN
  Administered 2016-06-21: 50 mg via INTRAVENOUS

## 2016-06-21 MED ORDER — LIDOCAINE 2% (20 MG/ML) 5 ML SYRINGE
INTRAMUSCULAR | Status: AC
  Filled 2016-06-21: qty 5

## 2016-06-21 MED ORDER — HEPARIN (PORCINE) IN NACL 100-0.45 UNIT/ML-% IJ SOLN: 800.0000 [IU]/h | INTRAMUSCULAR | Status: DC

## 2016-06-21 MED ORDER — CALCIUM CHLORIDE 10 % IV SOLN
INTRAVENOUS | Status: AC
  Filled 2016-06-21: qty 10

## 2016-06-21 MED ORDER — 0.9 % SODIUM CHLORIDE (POUR BTL) OPTIME
TOPICAL | Status: DC | PRN
  Administered 2016-06-21: 2000 mL

## 2016-06-21 MED ORDER — LIDOCAINE HCL (CARDIAC) 20 MG/ML IV SOLN
INTRAVENOUS | Status: DC | PRN
  Administered 2016-06-21: 50 mg via INTRAVENOUS

## 2016-06-21 MED ORDER — PHENYLEPHRINE HCL 10 MG/ML IJ SOLN
INTRAMUSCULAR | Status: DC | PRN
  Administered 2016-06-21 (×2): 120 ug via INTRAVENOUS

## 2016-06-21 MED ORDER — ALBUMIN HUMAN 5 % IV SOLN
INTRAVENOUS | Status: DC | PRN
  Administered 2016-06-21 (×2): via INTRAVENOUS

## 2016-06-21 MED ORDER — HEPARIN SODIUM (PORCINE) 1000 UNIT/ML IJ SOLN
INTRAMUSCULAR | Status: DC | PRN
  Administered 2016-06-21: 5000 [IU] via INTRAVENOUS
  Administered 2016-06-21: 4000 [IU] via INTRAVENOUS

## 2016-06-21 MED ORDER — PHENYLEPHRINE 40 MCG/ML (10ML) SYRINGE FOR IV PUSH (FOR BLOOD PRESSURE SUPPORT)
PREFILLED_SYRINGE | INTRAVENOUS | Status: AC
  Filled 2016-06-21: qty 10

## 2016-06-21 MED ORDER — ESMOLOL HCL 100 MG/10ML IV SOLN
INTRAVENOUS | Status: DC | PRN
  Administered 2016-06-21: 20 mg via INTRAVENOUS

## 2016-06-21 MED ORDER — HEMOSTATIC AGENTS (NO CHARGE) OPTIME
TOPICAL | Status: DC | PRN
  Administered 2016-06-21: 1 via TOPICAL
  Administered 2016-06-21: 2 via TOPICAL

## 2016-06-21 MED ORDER — HEPARIN (PORCINE) IN NACL 100-0.45 UNIT/ML-% IJ SOLN
500.0000 [IU]/h | INTRAMUSCULAR | Status: DC
  Administered 2016-06-21: 500 [IU]/h via INTRAVENOUS

## 2016-06-21 MED ORDER — ROCURONIUM BROMIDE 50 MG/5ML IV SOSY
PREFILLED_SYRINGE | INTRAVENOUS | Status: AC
  Filled 2016-06-21: qty 5

## 2016-06-21 MED ORDER — SUGAMMADEX SODIUM 200 MG/2ML IV SOLN
INTRAVENOUS | Status: DC | PRN
  Administered 2016-06-21: 111 mg via INTRAVENOUS

## 2016-06-21 MED ORDER — MIDAZOLAM HCL 2 MG/2ML IJ SOLN
INTRAMUSCULAR | Status: AC
  Filled 2016-06-21: qty 2

## 2016-06-21 MED ORDER — PROPOFOL 10 MG/ML IV BOLUS
INTRAVENOUS | Status: AC
  Filled 2016-06-21: qty 20

## 2016-06-21 MED ORDER — MIDAZOLAM HCL 5 MG/5ML IJ SOLN
INTRAMUSCULAR | Status: DC | PRN
  Administered 2016-06-21: 2 mg via INTRAVENOUS

## 2016-06-21 MED ORDER — PHENYLEPHRINE 40 MCG/ML (10ML) SYRINGE FOR IV PUSH (FOR BLOOD PRESSURE SUPPORT)
PREFILLED_SYRINGE | INTRAVENOUS | Status: AC
  Filled 2016-06-21: qty 20

## 2016-06-21 MED ORDER — FENTANYL CITRATE (PF) 100 MCG/2ML IJ SOLN
INTRAMUSCULAR | Status: DC | PRN
  Administered 2016-06-21 (×4): 50 ug via INTRAVENOUS

## 2016-06-21 MED ORDER — ONDANSETRON HCL 4 MG/2ML IJ SOLN
INTRAMUSCULAR | Status: AC
  Filled 2016-06-21: qty 2

## 2016-06-21 MED ORDER — CEFAZOLIN SODIUM 1 G IJ SOLR
INTRAMUSCULAR | Status: DC | PRN
  Administered 2016-06-21: 2 g via INTRAMUSCULAR

## 2016-06-21 MED ORDER — PROMETHAZINE HCL 25 MG/ML IJ SOLN: 6.2500 mg | INTRAMUSCULAR | Status: DC | PRN

## 2016-06-21 MED ORDER — IOPAMIDOL (ISOVUE-370) INJECTION 76%
INTRAVENOUS | Status: AC
  Filled 2016-06-21: qty 50

## 2016-06-21 MED ORDER — DEXAMETHASONE SODIUM PHOSPHATE 10 MG/ML IJ SOLN
INTRAMUSCULAR | Status: DC | PRN
  Administered 2016-06-21: 10 mg via INTRAVENOUS

## 2016-06-21 MED ORDER — HEPARIN (PORCINE) IN NACL 100-0.45 UNIT/ML-% IJ SOLN
800.0000 [IU]/h | INTRAMUSCULAR | Status: DC
  Administered 2016-06-21 – 2016-06-23 (×2): 800 [IU]/h via INTRAVENOUS
  Filled 2016-06-21 (×3): qty 250

## 2016-06-21 MED ORDER — SODIUM CHLORIDE 0.9 % IV SOLN
INTRAVENOUS | Status: DC | PRN
  Administered 2016-06-21: 10:00:00

## 2016-06-21 MED ORDER — PROPOFOL 10 MG/ML IV BOLUS
INTRAVENOUS | Status: DC | PRN
  Administered 2016-06-21: 100 mg via INTRAVENOUS

## 2016-06-21 SURGICAL SUPPLY — 60 items
CANISTER SUCTION 2500CC (MISCELLANEOUS) ×3 IMPLANT
CATH EMB 2FR 60CM (CATHETERS) ×6 IMPLANT
CATH EMB 3FR 40CM (CATHETERS) ×3 IMPLANT
CATH EMB 4FR 80CM (CATHETERS) ×9 IMPLANT
CLIP TI LARGE 6 (CLIP) ×3 IMPLANT
CLIP TI MEDIUM 24 (CLIP) ×3 IMPLANT
CLIP TI WIDE RED SMALL 24 (CLIP) ×3 IMPLANT
COVER PROBE W GEL 5X96 (DRAPES) ×3 IMPLANT
DERMABOND ADVANCED (GAUZE/BANDAGES/DRESSINGS) ×2
DERMABOND ADVANCED .7 DNX12 (GAUZE/BANDAGES/DRESSINGS) ×4 IMPLANT
DRAIN CHANNEL 15F RND FF W/TCR (WOUND CARE) IMPLANT
DRAPE C-ARM 42X72 X-RAY (DRAPES) ×3 IMPLANT
DRAPE X-RAY CASS 24X20 (DRAPES) IMPLANT
ELECT REM PT RETURN 9FT ADLT (ELECTROSURGICAL) ×3
ELECTRODE REM PT RTRN 9FT ADLT (ELECTROSURGICAL) ×2 IMPLANT
EVACUATOR SILICONE 100CC (DRAIN) IMPLANT
GAUZE SPONGE 4X4 12PLY STRL (GAUZE/BANDAGES/DRESSINGS) ×3 IMPLANT
GLOVE BIO SURGEON STRL SZ 6.5 (GLOVE) ×21 IMPLANT
GLOVE BIO SURGEON STRL SZ7.5 (GLOVE) ×3 IMPLANT
GLOVE BIOGEL PI IND STRL 6.5 (GLOVE) ×2 IMPLANT
GLOVE BIOGEL PI IND STRL 7.0 (GLOVE) ×6 IMPLANT
GLOVE BIOGEL PI INDICATOR 6.5 (GLOVE) ×1
GLOVE BIOGEL PI INDICATOR 7.0 (GLOVE) ×3
GLOVE ECLIPSE 7.5 STRL STRAW (GLOVE) ×6 IMPLANT
GOWN STRL REUS W/ TWL LRG LVL3 (GOWN DISPOSABLE) ×10 IMPLANT
GOWN STRL REUS W/ TWL XL LVL3 (GOWN DISPOSABLE) ×8 IMPLANT
GOWN STRL REUS W/TWL LRG LVL3 (GOWN DISPOSABLE) ×5
GOWN STRL REUS W/TWL XL LVL3 (GOWN DISPOSABLE) ×4
GRAFT PROPATEN W/RING 6X80X60 (Vascular Products) ×3 IMPLANT
HEMOSTAT SNOW SURGICEL 2X4 (HEMOSTASIS) ×9 IMPLANT
INSERT FOGARTY SM (MISCELLANEOUS) ×6 IMPLANT
KIT BASIN OR (CUSTOM PROCEDURE TRAY) ×3 IMPLANT
KIT ROOM TURNOVER OR (KITS) ×3 IMPLANT
NS IRRIG 1000ML POUR BTL (IV SOLUTION) ×6 IMPLANT
PACK PERIPHERAL VASCULAR (CUSTOM PROCEDURE TRAY) ×3 IMPLANT
PAD ARMBOARD 7.5X6 YLW CONV (MISCELLANEOUS) ×6 IMPLANT
SET COLLECT BLD 21X3/4 12 (NEEDLE) IMPLANT
SHEATH AVANTI 11CM 5FR (MISCELLANEOUS) ×3 IMPLANT
SPONGE LAP 18X18 X RAY DECT (DISPOSABLE) ×12 IMPLANT
STOPCOCK 4 WAY LG BORE MALE ST (IV SETS) ×3 IMPLANT
SUT ETHILON 3 0 PS 1 (SUTURE) IMPLANT
SUT GORETEX 6.0 TT13 (SUTURE) ×3 IMPLANT
SUT MNCRL AB 4-0 PS2 18 (SUTURE) ×6 IMPLANT
SUT PROLENE 5 0 C 1 24 (SUTURE) ×3 IMPLANT
SUT PROLENE 6 0 BV (SUTURE) ×3 IMPLANT
SUT PROLENE 6 0 CC 1 (SUTURE) ×3 IMPLANT
SUT SILK 2 0 FS (SUTURE) ×3 IMPLANT
SUT SILK 3 0 (SUTURE)
SUT SILK 3-0 18XBRD TIE 12 (SUTURE) IMPLANT
SUT VIC AB 2-0 CT1 27 (SUTURE) ×2
SUT VIC AB 2-0 CT1 TAPERPNT 27 (SUTURE) ×4 IMPLANT
SUT VIC AB 3-0 SH 27 (SUTURE) ×2
SUT VIC AB 3-0 SH 27X BRD (SUTURE) ×4 IMPLANT
SYR 3ML LL SCALE MARK (SYRINGE) ×3 IMPLANT
SYR TB 1ML LUER SLIP (SYRINGE) ×3 IMPLANT
TRAY FOLEY W/METER SILVER 16FR (SET/KITS/TRAYS/PACK) ×3 IMPLANT
TUBING EXTENTION W/L.L. (IV SETS) ×3 IMPLANT
UNDERPAD 30X30 (UNDERPADS AND DIAPERS) ×3 IMPLANT
WATER STERILE IRR 1000ML POUR (IV SOLUTION) ×3 IMPLANT
WIRE BENTSON .035X145CM (WIRE) ×3 IMPLANT

## 2016-06-21 NOTE — Anesthesia Procedure Notes (Signed)
Procedure Name: Intubation Date/Time: 06/21/2016 9:15 AM Performed by: Carmela RimaMARTINELLI, Valynn Schamberger F Pre-anesthesia Checklist: Timeout performed, Patient being monitored, Suction available, Patient identified and Emergency Drugs available Patient Re-evaluated:Patient Re-evaluated prior to inductionOxygen Delivery Method: Circle system utilized Preoxygenation: Pre-oxygenation with 100% oxygen Intubation Type: IV induction Ventilation: Mask ventilation without difficulty and Oral airway inserted - appropriate to patient size Laryngoscope Size: Mac and 3 Grade View: Grade I Tube type: Oral Tube size: 7.5 mm Number of attempts: 1 Placement Confirmation: breath sounds checked- equal and bilateral,  positive ETCO2 and ETT inserted through vocal cords under direct vision Secured at: 22 cm Tube secured with: Tape Dental Injury: Teeth and Oropharynx as per pre-operative assessment

## 2016-06-21 NOTE — Anesthesia Postprocedure Evaluation (Signed)
Anesthesia Post Note  Patient: Stephen Bowen  Procedure(s) Performed: Procedure(s) (LRB): EMBOLECTOMY left lower extremity. (Left) ANGIOGRAM EXTREMITY LEFT (Left) BYPASS GRAFT POPLITEAL TO TIBIAL USING GORE PROPATEN GRAFT (Left)  Patient location during evaluation: PACU Anesthesia Type: General Level of consciousness: awake and alert Pain management: pain level controlled Vital Signs Assessment: post-procedure vital signs reviewed and stable Respiratory status: spontaneous breathing, nonlabored ventilation, respiratory function stable and patient connected to nasal cannula oxygen Cardiovascular status: blood pressure returned to baseline and stable Postop Assessment: no signs of nausea or vomiting Anesthetic complications: no       Last Vitals:  Vitals:   06/21/16 0800 06/21/16 0820  BP: (!) 171/93   Pulse: 91   Resp: 15   Temp:  37.2 C    Last Pain:  Vitals:   06/21/16 0820  TempSrc: Oral  PainSc:                  Susan Bleich S

## 2016-06-21 NOTE — Progress Notes (Addendum)
Sheath removed from right femoral artery by Dutch QuintAmy Terrell, RRT, RCIS.  Manual pressure held for 20 minutes and hemostasis obtained.  Bruising noted to site and was present prior to sheath removal.  Site level 0 with no swelling or bleeding noted to site.  Patient tolerated procedure well.  Vital signs remain stable.  Gauze dressing applied to site with tegaderm.  Dressing is clean, dry and intact.

## 2016-06-21 NOTE — Progress Notes (Signed)
Notified of critical result, Troponin 36.56.  Paged Dr. Randie Heinzain.  Return call from Dr. Edilia Boickson.  Results given, no new orders obtained at this time.

## 2016-06-21 NOTE — Consult Note (Signed)
NEURO HOSPITALIST CONSULT NOTE   Requestig physician: Dr. Myra Gianotti  Reason for Consult: Aphasia noted on awakening from anesthesia  History obtained from:   Chart and PACU staff  HPI:                                                                                                                                          Stephen Bowen is an 60 y.o. male who presented to [facility] for management of left ischemic foot secondary to occluded left femoral to posterior tibial bypass. He underwent thromboembolectomy of the bypass graft and bypass from existing graft to distal PT artery with 6mm ringed ptfe with distal vein patch. Following the procedure a left lower extremity angiogram demonstrated runoff through the graft to the level of the ankle. During the procedure the patient was having ST changes and cardiology was consulted. The patient on awakening from anesthesia was aphasic and Code Stroke was called.   LKN: 9:06 at time of initiation of anesthesia.  Past Medical History:  Diagnosis Date  . HTN (hypertension)   . Noncompliance   . PAD (peripheral artery disease) (HCC)    a.  s/p left femoral to PT artery bypass with propaten on 02/16/16.    Past Surgical History:  Procedure Laterality Date  . ENDARTERECTOMY FEMORAL Left 02/16/2016   Procedure: LEFT FEMORAL ENDARTERECTOMY;  Surgeon: Maeola Harman, MD;  Location: [facility] OR;  Service: Vascular;  Laterality: Left;  . FEMORAL BYPASS  02/16/2016   LEFT FEMORAL-POSTERIOR TIBIAL ARTERY BYPASS  WITH PROPATEN 6 MM X 80 CM VASCULAR RING GRAFT (Left)  . FEMORAL-TIBIAL BYPASS GRAFT Left 02/16/2016   Procedure: LEFT FEMORAL-POSTERIOR TIBIAL ARTERY BYPASS  WITH PROPATEN 6 MM X 80 CM VASCULAR RING GRAFT;  Surgeon: Maeola Harman, MD;  Location: [facility] OR;  Service: Vascular;  Laterality: Left;  . PATCH ANGIOPLASTY Left 02/16/2016   Procedure: PATCH ANGIOPLASTY WITH Livia Snellen BIOLOGIC PATCH 1 CM X 6 CM;  Surgeon: Maeola Harman, MD;  Location: [facility] OR;  Service: Vascular;  Laterality: Left;  . PERIPHERAL VASCULAR CATHETERIZATION N/A 07/07/2015   Procedure: Abdominal Aortogram w/Lower Extremity;  Surgeon: Fransisco Hertz, MD;  Location: [facility] INVASIVE CV LAB;  Service: Cardiovascular;  Laterality: N/A;  . PERIPHERAL VASCULAR CATHETERIZATION N/A 02/15/2016   Procedure: Abdominal Aortogram w/Lower Extremity;  Surgeon: Maeola Harman, MD;  Location: [facility] INVASIVE CV LAB;  Service: Cardiovascular;  Laterality: N/A;  . PERIPHERAL VASCULAR CATHETERIZATION Bilateral 06/20/2016   Procedure: Lower Extremity Angiography;  Surgeon: Maeola Harman, MD;  Location: [facility] INVASIVE CV LAB;  Service: Cardiovascular;  Laterality: Bilateral;  limited runoff on rt leg thrombolysis lt leg bypass graft  . 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  Family History  Problem Relation Age of Onset  . Cancer Mother     deceased in 2970s   . Heart attack Mother     diseased  . Heart disease Father   . Stroke Sister     age 4150s  . Heart disease Brother     pacemaker in his 2950s    Social History:  reports that he has been smoking Cigarettes.  He has a 20.00 pack-year smoking history. He has never used smokeless tobacco. He reports that he drinks about 8.4 oz of alcohol per week . He reports that he does not use drugs.  No Known Allergies  MEDICATIONS:                                                                                                                     Scheduled: . aspirin  325 mg Oral Daily  . atorvastatin  40 mg Oral q1800  . iopamidol      . mouth rinse  15 mL Mouth Rinse BID  . metoprolol  5 mg Intravenous Q6H  . pantoprazole  40 mg Oral Daily  . sodium chloride flush  3 mL Intravenous Q12H     ROS:                                                                                                                                       Unable to obtain due  to aphasia.   Blood pressure (!) 171/93, pulse 91, temperature 98.9 F (37.2 C), temperature source Oral, resp. rate 15, height 5\' 8"  (1.727 m), weight 55.5 kg (122 lb 5.7 oz), SpO2 96 %.   General Examination:                                                                                                      HEENT-  Normocephalic/atraumatic.    Lungs- Respirations unlabored. No gross wheezing.  Extremities- LLE is cool to touch, pale and tender to palpation.   Neurological  Examination Mental Status: Awake. Dense receptive and expressive aphasia. Able to follow only some simple motor commands after repeated requests and pantomiming by examiner of correct motor responses.  Cranial Nerves:  II: PERRL Does not blink to threat or fixate on visual stimuli. Has roving EOM and appears to be continually scanning for a visual stimulus to fixate upon.  III,IV, VI: ptosis not present, extra-ocular motions are roving and intact conjugately in all directions without nystagmus. V,VII: grimace is symmetric, reacts to facial noxious stimuli more on right than left.  VIII: Unable to formally assess. Reacts to auditory stimuli.  IX,X: No hypophonia noted with occasional speech output. XI: No asymmetry noted.  XII: Unable to assess Motor: Moves bilateral upper extremities purposefully and equally to noxious stimuli. Withdraws RLE to noxious and will lift briefly to command. LLE does not move except for weak guarding motions when stimulated or passively moved. Patient appears to be in pain with passive movement and tactile stimulation of his LLE.  Sensory: Reacts to FT in bilateral upper and right lower extremity. Pain in LLE to stimulation, as described above.  Deep Tendon Reflexes: Hypoactive bilateral upper and RLE. Tenderness precludes testing of LLE.  Plantars: Right equivocal. Left not tested due to ischemic limb pain.  Cerebellar: No ataxia with spontaneous movements. Does not follow commands for FNF.   Gait: Unable to assess.   Lab Results: Basic Metabolic Panel:  Recent Labs Lab 06/19/16 1301 06/20/16 0300 06/21/16 0422 06/21/16 1156  NA 134* 136 132* 134*  K 4.2 3.7 3.7 4.2  CL 96* 101 99*  --   CO2 25 27 25   --   GLUCOSE 114* 118* 107* 115*  BUN 9 6 6   --   CREATININE 0.96 0.86 0.82  --   CALCIUM 9.4 8.7* 8.3*  --     Liver Function Tests:  Recent Labs Lab 06/20/16 0300  AST 11*  ALT 10*  ALKPHOS 71  BILITOT 0.2*  PROT 6.7  ALBUMIN 3.2*   No results for input(s): LIPASE, AMYLASE in the last 168 hours. No results for input(s): AMMONIA in the last 168 hours.  CBC:  Recent Labs Lab 06/19/16 1301 06/20/16 0300 06/20/16 1720 06/20/16 2222 06/21/16 0422 06/21/16 1156  WBC 12.2* 9.5 10.0 8.4 8.2  --   HGB 15.3 13.2 12.9* 12.1* 11.7* 7.8*  HCT 44.5 39.8 39.9 37.5* 36.3* 23.0*  MCV 88.5 88.6 90.5 89.7 89.6  --   PLT 391 391 282 265 236  --     Cardiac Enzymes: No results for input(s): CKTOTAL, CKMB, CKMBINDEX, TROPONINI in the last 168 hours.  Lipid Panel: No results for input(s): CHOL, TRIG, HDL, CHOLHDL, VLDL, LDLCALC in the last 168 hours.  CBG: No results for input(s): GLUCAP in the last 168 hours.  Microbiology: Results for orders placed or performed during the hospital encounter of 06/19/16  MRSA PCR Screening     Status: None   Collection Time: 06/19/16  8:11 PM  Result Value Ref Range Status   MRSA by PCR NEGATIVE NEGATIVE Final    Comment:        The GeneXpert MRSA Assay (FDA approved for NASAL specimens only), is one component of a comprehensive MRSA colonization surveillance program. It is not intended to diagnose MRSA infection nor to guide or monitor treatment for MRSA infections.     Coagulation Studies:  Recent Labs  06/19/16 1535 06/19/16 2024  LABPROT 12.4 12.9  INR 0.92 0.97    Imaging: Ct Head Wo  Contrast  Result Date: 06/21/2016 CLINICAL DATA:  Altered mental status EXAM: CT HEAD WITHOUT CONTRAST  TECHNIQUE: Contiguous axial images were obtained from the base of the skull through the vertex without intravenous contrast. COMPARISON:  None. FINDINGS: Brain: The ventricles are normal in size and configuration. There is no intracranial mass, hemorrhage, extra-axial fluid collection, or midline shift. There is patchy small vessel disease in the centra semiovale bilaterally. There is evidence of an age uncertain small infarct at the genu and posterior limb of the left internal capsule. There is a prior appearing infarct in the lateral left thalamus. There is decreased attenuation in much of with left occipital lobe, particularly involving the medial to mid portions, felt to be indicative of acute infarct in this area. There is also decreased attenuation in a portion of the posterior mid to lower cerebellum on the right. These areas in the left occipital lobe and right cerebellum appear to represent acute infarcts. Vascular: No appreciable hyperdense vessel. There are foci of calcification in each carotid siphon region. Skull: Bony calvarium appears intact. Sinuses/Orbits: There is a small retention cyst in the lateral left maxillary antrum. There is mucosal thickening in an anterior superior ethmoid air cell on the left. There is a concha bullosa on the left, an anatomic variant. There is rightward deviation nasal septum. Visualized orbits appear symmetric bilaterally. Other: Visualized mastoid air cells are clear. IMPRESSION: Evidence of acute infarct involving a portion of the posterior mid to inferior right cerebellum as well as much of the left occipital lobe, particularly medially and mid portions. These infarcts involve the posterior circulation ; question embolic phenomenon given the bilateral nature of these infarcts. There is an age uncertain infarct in the left genu and posterior limb internal capsule. There is patchy small vessel disease in the centra semiovale as well as a small prior appearing infarct  in the lateral left cerebellum. No hemorrhage, mass, or extra-axial fluid collection. Areas of vascular calcification noted in the carotid siphon regions. Mild paranasal sinus disease noted. These results were called by telephone at the time of interpretation on 06/21/2016 at 2:04 pm to Dr. Doylene Bodehris Mcalhaney, who verbally acknowledged these results. Electronically Signed   By: Bretta BangWilliam  Woodruff III M.D.   On: 06/21/2016 14:04   Dg Ang/ext/uni/or Left  Result Date: 06/21/2016 CLINICAL DATA:  60 year old male with acute left lower extremity ischemia EXAM: LEFT ANG/EXT/UNI/ OR CONTRAST:  Op note FLUOROSCOPY TIME:  Op note COMPARISON:  None. FINDINGS: Two intraoperative fluoroscopic spot images of left lower extremity angiogram demonstrates partial opacification of left femoral distal bypass, with partial opacification of tibial vessels. IMPRESSION: Intraoperative left lower extremity angiogram for acute leg ischemia, as above. Please refer to the dictated operative report for full details of intraoperative findings and procedure. Signed, Yvone NeuJaime S. Loreta AveWagner, DO [facility] Electronically Signed   By: Gilmer MorJaime  Wagner D.O.   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  1. The infarctions appear most likely to be cardioembolic.  2. CTA reveals evidence of acute infarct involving a portion of the posterior mid to inferior right cerebellum as well as much of the left occipital lobe, particularly medially and mid portions. These infarcts involve the posterior circulation ; question embolic phenomenon given the bilateral nature of these infarcts.  3. Also noted on CT is an age-indeterminate infarct in the left genu and posterior limb of the internal capsule. There is patchy small vessel disease in the  centra semiovale as well as a small chronic-appearing infarct in the lateral left cerebellum.     Recommendations: 1. Not an IV tPA candidate as he is out of the 4.5 hour time window. Not an endovascular candidate due to visible completed strokes on CTA, relative inaccessibility of the left PCA stenoses/occlusions to catheterization and high risk of bleeding. 2.  SBP goal as per vascular surgery. From a neurological standpoint permissive HTN for 24 hours with SBP treated if it goes above 180 would be optimal.  3. MRI brain.  4. Echocardiogram.  5. PT/OT/Speech.  6. Continue ASA and atrovastatin.   Electronically signed: Dr. Caryl Pina 06/21/2016, 3:38 PM

## 2016-06-21 NOTE — Progress Notes (Addendum)
ANTICOAGULATION CONSULT NOTE - Follow Up Consult  Pharmacy Consult for Heparin Indication: S/P thrombectomy, new stroke  No Known Allergies  Patient Measurements: Height: 5\' 8"  (172.7 cm) Weight: 122 lb 5.7 oz (55.5 kg) IBW/kg (Calculated) : 68.4 Heparin Dosing Weight: 55  Vital Signs: Temp: 97.9 F (36.6 C) (12/28 1530) Temp Source: Oral (12/28 0820) BP: 112/75 (12/28 1600) Pulse Rate: 133 (12/28 1600)  Labs:  Recent Labs  06/19/16 1301 06/19/16 1535 06/19/16 2024  06/20/16 0300 06/20/16 1720 06/20/16 2222 06/21/16 0422 06/21/16 1156  HGB 15.3  --   --   --  13.2 12.9* 12.1* 11.7* 7.8*  HCT 44.5  --   --   --  39.8 39.9 37.5* 36.3* 23.0*  PLT 391  --   --   --  391 282 265 236  --   LABPROT  --  12.4 12.9  --   --   --   --   --   --   INR  --  0.92 0.97  --   --   --   --   --   --   HEPARINUNFRC  --   --   --   < > 0.44 <0.10* 0.12* <0.10*  --   CREATININE 0.96  --   --   --  0.86  --   --  0.82  --   < > = values in this interval not displayed.  Estimated Creatinine Clearance: 75.2 mL/min (by C-G formula based on SCr of 0.82 mg/dL).   Medications:  Scheduled:  . aspirin  325 mg Oral Daily  . atorvastatin  40 mg Oral q1800  . iopamidol      . mouth rinse  15 mL Mouth Rinse BID  . metoprolol  5 mg Intravenous Q6H  . pantoprazole  40 mg Oral Daily  . sodium chloride flush  3 mL Intravenous Q12H    Assessment: 60yo male on heparin for arterial occlusion.  Now s/p cath directed alteplase 12/27 & thrombectomy of L-femoral to PT BPG today.  Intraoperatively he developed ST elevation with post-op AMS.  Code STEMI initially called then cancelled( but will need cath) and Code Stroke called- CT(+)evolving embolic stroke.   Pt was previously therapeutic on 900units/hr (HL 0.44).  Currently no heparin running.  I have verified with RN that heparin will be running strictly through IV (vs previously divided between tPA cath, sheath, & IV).  Will need to adjust heparin  level goal in setting of Stroke.  Goal of Therapy:  Heparin level 0.3-0.5 units/ml,  Monitor platelets by anticoagulation protocol: Yes   Plan:  Resume heparin at 800 units/hr, no bolus- to start at 2300 (6hr after sheath pulled) Check heparin level in 8hr Daily heparin level, CBC Watch for s/s of bleeding   Stephen Bowen, PharmD Clinical Pharmacist [facility]

## 2016-06-21 NOTE — Progress Notes (Signed)
  Progress Note    06/21/2016 8:25 AM 1 Day Post-Op  Subjective:  Left foot is numb  Vitals:   06/21/16 0800 06/21/16 0820  BP: (!) 171/93   Pulse: 91   Resp: 15   Temp:  98.9 F (37.2 C)    Physical Exam: aaox3 No signals in left foot CBC    Component Value Date/Time   WBC 8.2 06/21/2016 0422   RBC 4.05 (L) 06/21/2016 0422   HGB 11.7 (L) 06/21/2016 0422   HCT 36.3 (L) 06/21/2016 0422   PLT 236 06/21/2016 0422   MCV 89.6 06/21/2016 0422   MCH 28.9 06/21/2016 0422   MCHC 32.2 06/21/2016 0422   RDW 14.7 06/21/2016 0422   LYMPHSABS 1.1 02/13/2016 1519   MONOABS 0.6 02/13/2016 1519   EOSABS 0.0 02/13/2016 1519   BASOSABS 0.0 02/13/2016 1519    BMET    Component Value Date/Time   NA 132 (L) 06/21/2016 0422   K 3.7 06/21/2016 0422   CL 99 (L) 06/21/2016 0422   CO2 25 06/21/2016 0422   GLUCOSE 107 (H) 06/21/2016 0422   BUN 6 06/21/2016 0422   CREATININE 0.82 06/21/2016 0422   CREATININE 0.92 11/13/2013 1031   CALCIUM 8.3 (L) 06/21/2016 0422   GFRNONAA >60 06/21/2016 0422   GFRNONAA >89 11/13/2013 1031   GFRAA >60 06/21/2016 0422   GFRAA >89 11/13/2013 1031    INR    Component Value Date/Time   INR 0.97 06/19/2016 2024     Intake/Output Summary (Last 24 hours) at 06/21/16 0825 Last data filed at 06/21/16 0800  Gross per 24 hour  Intake          2340.69 ml  Output              750 ml  Net          1590.69 ml     Assessment:  60 y.o. male is s/p graft lysis with drop in fibrinogen overnight foot remains cool and numb  Plan: Lytic stopped overnight for drop in fibrinogen Plan changed to OR for graft thrombectomy today.  Discussed gravity of situation and possibility of requiring amputation in the near future   JamestownBrandon C. Randie Heinzain, MD [facility] [place] Office: [phone] Pager: [phone]  06/21/2016 8:25 AM

## 2016-06-21 NOTE — Code Documentation (Addendum)
60yo male who underwent thromboembolectomy of left femoral to PT bypass graft today.  LKW 0900 prior to procedure.  Patient was noted to have confusion post-procedure while in the PACU.  CT head showing evidence of acute infarct involving a portion of the posterior mid to inferior right cerebellum as well as much of the left occipital lobe, particularly medially and mid portions.  Code STEMI called, but patient will not go to the cath lab at this time per Dr. Sanjuana KavaMcalhaney.  Code Stroke activated.  Stroke team to the bedside.  NIHSS 9, see documentation for details and code stroke times.  Patient with aphasia and unable to follow commands on exam.  Patient with LLE weakness but it is unclear if this is r/t the procedure.  Dr. Otelia LimesLindzen to the bedside.  Patient is outside the window for treatment with tPA.  Patient to CT for CTA and CTP per MD.  Patient transported with PACU RN and Stroke team.  CT 1 scanner down and unable to complete CTP.  MD made aware.  CTA head and neck completed.  Patient transported back to PACU.  Dr. Otelia LimesLindzen paged and made aware that imaging was completed.  Patient is not a candidate for endovascular intervention.  Dr. Otelia LimesLindzen is ok with patient going back to bed on 2S.  Bedside handoff with PACU RN Aneta MinsPhillip.

## 2016-06-21 NOTE — Consult Note (Signed)
Cardiology Consultation Note    Patient ID: Stephen Bowen Delsanto, MRN: [id number], DOB/AGE: 03/06/1956 60 y.o. Admit date: 06/19/2016   Date of Consult: 06/21/2016 Primary Physician: PROVIDER NOT IN SYSTEM Primary Cardiologist: Dr. Herbie BaltimoreHarding  Chief Complaint: "I'm fine" Reason for Consultation: ST elevation on EKG Requesting MD: Dr. Randie Heinzain  HPI: Stephen Bowen Scaff is a 60 y.o. male with history of HTN, PAD, noncompliance whom we are asked to see acutely for EKG changes. He is  s/p left femoral to PT artery bypass with propaten on 02/16/16 with Dr. Randie Heinzain. Pre-op evaluation was obtained at that time and he was cleared by cardiology given normal LVEF and no anginal symptoms. 2D echo 01/2016: EF 55-60%, grade 1 DD, elevated CVP. He never started his coumadin because he had no ride for INR follow-up - this had been started to maintain bypass patency. He presented back to the hospital 06/19/16 with cold, painful ischemic left foot secondary to occluded left bypass graft. He went to the OR yesterday afternoon for graft lysis but overnight his foot remained cool and numb. He was on heparin and alteplase all night. He was taken back to the OR for graft thrombectomy. Per Bowen/w Dr. Randie Heinzain, during the procedure the patient became hypotensive with elevated ST segments on telemetry. He was started on pressors - he lost about 1200 of blood during the procedure and was given 700 back. He was extubated, weaned off pressors and brought back to PACU. 12 lead EKG showed anterior ST elevation with reciprocal ST depression inferiorly. Labs prior to surgery showed mild drop in Hgb 13->11.7, Cr 0.82, albumin 3.2; subsequent istat with Hgb 7.8. HR currently 120s-130s, BP 110/86. He is not complaining of any pain but appeared to have altered mental status - answering questions inconsistently, ignoring questions, following commands inconsistently, poor eye contact. He would frequently either say "I'm fine" or his last name. He denied any chest  pain or SOB. Code STEMI was initially called then canceled. The patient was diverted to CT which revealed an acute stroke concerning for embolic phenomena. In addition to these evolving issues, there is concern that his left graft has failed as his left leg remains cold.    Past Medical History:  Diagnosis Date  . HTN (hypertension)   . PAD (peripheral artery disease) [facility](HCC)       Surgical History:  Past Surgical History:  Procedure Laterality Date  . ENDARTERECTOMY FEMORAL Left 02/16/2016   Procedure: LEFT FEMORAL ENDARTERECTOMY;  Surgeon: Maeola HarmanBrandon Christopher Cain, MD;  [facility];  Service: Vascular;  Laterality: Left;  . FEMORAL BYPASS  02/16/2016   LEFT FEMORAL-POSTERIOR TIBIAL ARTERY BYPASS  WITH PROPATEN 6 MM X 80 CM VASCULAR RING GRAFT (Left)  . FEMORAL-TIBIAL BYPASS GRAFT Left 02/16/2016   Procedure: LEFT FEMORAL-POSTERIOR TIBIAL ARTERY BYPASS  WITH PROPATEN 6 MM X 80 CM VASCULAR RING GRAFT;  Surgeon: Maeola HarmanBrandon Christopher Cain, MD;  [facility];  Service: Vascular;  Laterality: Left;  . PATCH ANGIOPLASTY Left 02/16/2016   Procedure: PATCH ANGIOPLASTY WITH Livia SnellenXENOSURE BIOLOGIC PATCH 1 CM X 6 CM;  Surgeon: Maeola HarmanBrandon Christopher Cain, MD;  [facility];  Service: Vascular;  Laterality: Left;  . PERIPHERAL VASCULAR CATHETERIZATION N/A 07/07/2015   Procedure: Abdominal Aortogram w/Lower Extremity;  Surgeon: Fransisco HertzBrian L Chen, MD;  [facility];  Service: Cardiovascular;  Laterality: N/A;  . PERIPHERAL VASCULAR CATHETERIZATION N/A 02/15/2016   Procedure: Abdominal Aortogram w/Lower Extremity;  Surgeon: Maeola HarmanBrandon Christopher Cain, MD;  [facility];  Service: Cardiovascular;  Laterality: N/A;  . PERIPHERAL VASCULAR CATHETERIZATION Bilateral 06/20/2016   Procedure: Lower Extremity Angiography;  Surgeon: Maeola Harman, MD;  Location[facility] IN[facility]AS[facility] CV LAB;  Service: Cardiovascular;  Laterality: Bilateral;  limited runoff on rt leg thrombolysis lt leg bypass  graft  . 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     Home Meds: Prior to Admission medications   Medication Sig Start Date End Date Taking? Authorizing Provider  aspirin 325 MG tablet Take 1 tablet (325 mg total) by mouth daily. Patient not taking: Reported on 06/19/2016 02/18/16   Mir Vergie Living, MD  atorvastatin (LIPITOR) 40 MG tablet Take 1 tablet (40 mg total) by mouth daily at 6 PM. Patient not taking: Reported on 06/19/2016 02/18/16   Mir Vergie Living, MD  bisacodyl (DULCOLAX) 5 MG EC tablet Take 1 tablet (5 mg total) by mouth daily as needed for moderate constipation. Patient not taking: Reported on 06/19/2016 02/18/16   Mir Vergie Living, MD  docusate sodium (COLACE) 100 MG capsule Take 1 capsule (100 mg total) by mouth daily. Patient not taking: Reported on 06/19/2016 02/18/16   Mir Vergie Living, MD  enoxaparin (LOVENOX) 100 MG/ML injection Inject 0.85 mLs (85 mg total) into the skin daily. 02/19/16 02/29/16  Mir Vergie Living, MD  HYDROcodone-acetaminophen (NORCO/VICODIN) 5-325 MG tablet Take 1-2 tablets by mouth every 4 (four) hours as needed for moderate pain. Patient not taking: Reported on 06/19/2016 02/18/16   Mir Vergie Living, MD  lisinopril-hydrochlorothiazide (ZESTORETIC) 10-12.5 MG tablet Take 1 tablet by mouth daily. Patient not taking: Reported on 06/19/2016 06/29/15   Melton Krebs, PA-C  nicotine (NICODERM CQ - DOSED IN MG/24 HOURS) 21 mg/24hr patch Place 1 patch (21 mg total) onto the skin daily. Patient not taking: Reported on 06/19/2016 08/31/15   Selina Cooley, MD  nicotine polacrilex (NICORETTE) 4 MG gum Take 1 each (4 mg total) by mouth as needed for smoking cessation. Patient not taking: Reported on 06/19/2016 08/31/15   Selina Cooley, MD  pantoprazole (PROTONIX) 40 MG tablet Take 1 tablet (40 mg total) by mouth daily. Patient not taking: Reported on 06/19/2016 02/18/16   Mir Vergie Living, MD  selenium sulfide (SELSUN) 1 % LOTN Put on your face in the shower and wash off daily Patient not taking: Reported on 06/19/2016 08/31/15   Selina Cooley, MD  verapamil (CALAN-SR) 120 MG CR tablet Take 1 tablet (120 mg total) by mouth daily. Patient not taking: Reported on 06/19/2016 08/31/15 08/30/16  Selina Cooley, MD  warfarin (COUMADIN) 5 MG tablet Take 1 tablet (5 mg total) by mouth daily. Patient not taking: Reported on 06/19/2016 02/19/16   Mir Vergie Living, MD    Inpatient Medications:  . [MAR Hold] aspirin  325 mg Oral Daily  . [MAR Hold] atorvastatin  40 mg Oral q1800  . [MAR Hold] mouth rinse  15 mL Mouth Rinse BID  . [MAR Hold] metoprolol  5 mg Intravenous Q6H  . [MAR Hold] pantoprazole  40 mg Oral Daily  . [MAR Hold] sodium chloride flush  3 mL Intravenous Q12H  . [MAR Hold] verapamil  120 mg Oral Daily   . sodium chloride 50 mL/hr at 06/21/16 0800  . heparin 250 Units/hr (06/21/16 0800)  . heparin 250 Units/hr (06/21/16 0800)  . heparin 500 Units/hr (06/21/16 0800)    Allergies: No Known Allergies  Social History   Social History  . Marital  status: Single    Spouse name: N/A  . Number of children: N/A  . Years of education: N/A   Occupational History  . Unemployed    Social History Main Topics  . Smoking status: Current Every Day Smoker    Packs/day: 0.50    Years: 40.00    Types: Cigarettes  . Smokeless tobacco: Never Used     Comment: cutting back amount he is buying  . Alcohol use 8.4 oz/week    14 Cans of beer per week     Comment: Drinks a 40 oz "tallboy" daily  . Drug use: No  . Sexual activity: Yes    Birth control/ protection: Condom   Other Topics Concern  . Not on file   Social History Narrative   Smoking cigarettes since 60 y.o   Drinks beer 24 oz every other day         Family History  Problem Relation Age of Onset  . Cancer Mother     deceased in 77s   . Heart attack Mother     diseased  . Heart disease Father     . Stroke Sister     age 45s  . Heart disease Brother     pacemaker in his 17s     Review of Systems:unable to obtain due to patient's mental status  Labs:  Lab Results  Component Value Date   WBC 8.2 06/21/2016   HGB 7.8 (L) 06/21/2016   HCT 23.0 (L) 06/21/2016   MCV 89.6 06/21/2016   PLT 236 06/21/2016    Recent Labs Lab 06/20/16 0300 06/21/16 0422 06/21/16 1156  NA 136 132* 134*  K 3.7 3.7 4.2  CL 101 99*  --   CO2 27 25  --   BUN 6 6  --   CREATININE 0.86 0.82  --   CALCIUM 8.7* 8.3*  --   PROT 6.7  --   --   BILITOT 0.2*  --   --   ALKPHOS 71  --   --   ALT 10*  --   --   AST 11*  --   --   GLUCOSE 118* 107* 115*   Lab Results  Component Value Date   CHOL 159 02/14/2016   HDL 45 02/14/2016   LDLCALC 99 02/14/2016   TRIG 73 02/14/2016   Radiology/Studies:  Ct Head Wo Contrast  Result Date: 06/21/2016 CLINICAL DATA:  Altered mental status EXAM: CT HEAD WITHOUT CONTRAST TECHNIQUE: Contiguous axial images were obtained from the base of the skull through the vertex without intravenous contrast. COMPARISON:  None. FINDINGS: Brain: The ventricles are normal in size and configuration. There is no intracranial mass, hemorrhage, extra-axial fluid collection, or midline shift. There is patchy small vessel disease in the centra semiovale bilaterally. There is evidence of an age uncertain small infarct at the genu and posterior limb of the left internal capsule. There is a prior appearing infarct in the lateral left thalamus. There is decreased attenuation in much of with left occipital lobe, particularly involving the medial to mid portions, felt to be indicative of acute infarct in this area. There is also decreased attenuation in a portion of the posterior mid to lower cerebellum on the right. These areas in the left occipital lobe and right cerebellum appear to represent acute infarcts. Vascular: No appreciable hyperdense vessel. There are foci of calcification in each  carotid siphon region. Skull: Bony calvarium appears intact. Sinuses/Orbits: There is a small retention cyst in  the lateral left maxillary antrum. There is mucosal thickening in an anterior superior ethmoid air cell on the left. There is a concha bullosa on the left, an anatomic variant. There is rightward deviation nasal septum. Visualized orbits appear symmetric bilaterally. Other: Visualized mastoid air cells are clear. IMPRESSION: Evidence of acute infarct involving a portion of the posterior mid to inferior right cerebellum as well as much of the left occipital lobe, particularly medially and mid portions. These infarcts involve the posterior circulation ; question embolic phenomenon given the bilateral nature of these infarcts. There is an age uncertain infarct in the left genu and posterior limb internal capsule. There is patchy small vessel disease in the centra semiovale as well as a small prior appearing infarct in the lateral left cerebellum. No hemorrhage, mass, or extra-axial fluid collection. Areas of vascular calcification noted in the carotid siphon regions. Mild paranasal sinus disease noted. These results were called by telephone at the time of interpretation on 06/21/2016 at 2:04 pm to Dr. Doylene Bode, who verbally acknowledged these results. Electronically Signed   By: Bretta Bang III M.Bowen.   On: 06/21/2016 14:04   Dg Ang/ext/uni/or Left  Result Date: 06/21/2016 CLINICAL DATA:  60 year old male with acute left lower extremity ischemia EXAM: LEFT ANG/EXT/UNI/ OR CONTRAST:  Op note FLUOROSCOPY TIME:  Op note COMPARISON:  None. FINDINGS: Two intraoperative fluoroscopic spot images of left lower extremity angiogram demonstrates partial opacification of left femoral distal bypass, with partial opacification of tibial vessels. IMPRESSION: Intraoperative left lower extremity angiogram for acute leg ischemia, as above. Please refer to the dictated operative report for full details of  intraoperative findings and procedure. Signed, Yvone Neu. Loreta Ave, DO [facility] Electronically Signed   By: Gilmer Mor Bowen.O.   On: 06/21/2016 12:58    Wt Readings from Last 3 Encounters:  06/20/16 122 lb 5.7 oz (55.5 kg)  02/14/16 125 lb 9.6 oz (57 kg)  08/31/15 135 lb 12.8 oz (61.6 kg)    EKG: sinus tach anterior ST elevation V2-V3, ST depression II, III, avF and ST sagging in V6.  Physical Exam: Blood pressure BP 110/86, pulse 122, temperature 98.9 F (37.2 C), temperature source Oral, resp. rate 15, height 5\' 8"  (1.727 m), weight 122 lb 5.7 oz (55.5 kg), SpO2 96 %. Body mass index is 18.6 kg/m. General: Chronically ill appearing thin WM in no acute distress. Head: Normocephalic, atraumatic, sclera non-icteric, no xanthomas, nares are without discharge.  Neck: Negative for carotid bruits. JVD not elevated. Lungs: Clear bilaterally to auscultation without wheezes, rales, or rhonchi. Breathing is unlabored. Heart: Regular, tachycardic, with S1 S2. No murmurs, rubs, or gallops appreciated. Abdomen: Soft, non-tender, non-distended with normoactive bowel sounds. No hepatomegaly. No rebound/guarding. No obvious abdominal masses. Msk: Generally thin, does not cooperate with MSK exam Extremities: No clubbing or cyanosis. No edema. LLE cool, unable to palpate distal pulses. RLE warm, 1+ pulse.Marland Kitchen Neuro: Limited response to questions, poor eye contact, does not answer questions appropriately, follows very few commands appropriately. Does not cooperate with strength exam. Psych: Flat affect.     Assessment and Plan  48M with HTN, PAD (s/p left femoral to PT artery bypass with propaten on 02/16/16), noncompliance (including with Coumadin) who was readmitted for ischemic left leg and graft failure who was found to have hypotension and ST segment elevation. He was also noted to have AMS post-procedure and CT showed evolving acute embolic  stroke. Code STEMI canceled and code stroke called.  1. Abnormal EKG - certainly concerning for anterior STEMI. Difficult situation as baseline EKG previously showed anterior J point elevation possibly related to LVH with repolarization abnormality. The patient was found to have acute stroke and denies any chest pain. He is now hemodynamically stable. In the setting of evolving stroke and altered mental status we have held off on proceeding emergently to the cath lab. Would recommend supportive care for now. Will await input regarding anticoagulation from neurology. Cycle troponins. Check 2D echo. Consider resuming IV metoprolol when blood pressure is more stable (cannot lower too much given recent hypotension requiring pressors and acute stroke). Check lipids in AM. Resume statin when cleared for oral meds. Will need to consider cardiac cath when clinically appropriate dependent on clinical trajectory. Multiple RFs for CAD.  2. Hypertension with hypotension - see above.  3. H/o noncompliance - noted.  4. ABL anemia - per primary team.  Signed, Laurann Montanaayna N Dunn PA-C 06/21/2016, 2:13 PM Pager: [phone]  I have personally seen and examined this patient with Ronie Spiesayna Dunn, PA-C. I agree with the assessment and plan as outlined above. [name] has been seen in the past by Dr. Herbie BaltimoreHarding. He was admitted with ischemic left foot due to presumed non-compliance with coumadin after prior bypass in the left leg. He was lucid this am per report. He had been on lytic therapy overnight due to graft occlusion. He went to the OR for graft thrombectomy today and became hypotensive with anterior ST elevation on EKG. The case ended, he was extubated and sent to the PACU. In the PACU, he was confused but hemodynamically stable. Cardiology called for consult given his EKG changes. The patient was awake but unable to answer questions. EKG with ST elevation in leads V1-V3. Baseline EKG with LVH changes but ST elevation more  pronounced today. Pt does not voice c/o chest pain. He appears comfortable. BP stable at 110/80. Tele with sinus tachycardia.  Plans made for emergent cath. Given his confusion, we arranged a STAT head CT which showed acute embolic CVA. Code STEMI cancelled. Code Stroke activated.  Repeat EKG with continued ST changes but not clearly in pattern of acute MI given his baseline EKG changes. He is not a good candidate for cath today given his acute CVA. Neurology at bedside. Will ask Neurology to guide anti-coagulation. Will not use beta blocker given acute CVA and need for permissive hypertension.  We will manage him conservatively today from a cardiac perspective. He will likely need a cardiac cath before discharge.   Verne CarrowChristopher McAlhany 06/21/2016  3:25 PM

## 2016-06-21 NOTE — Progress Notes (Signed)
ANTICOAGULATION CONSULT NOTE - Follow Up Consult  Pharmacy Consult for Heparin Indication: arterial occlusion  No Known Allergies  Patient Measurements: Height: 5\' 8"  (172.7 cm) Weight: 122 lb 5.7 oz (55.5 kg) IBW/kg (Calculated) : 68.4 Heparin Dosing Weight: 55.5 kg  Vital Signs: Temp: 98.4 F (36.9 C) (12/28 0400) Temp Source: Oral (12/28 0400) BP: 157/87 (12/28 0405) Pulse Rate: 82 (12/28 0400)  Labs:  Recent Labs  06/19/16 1301 06/19/16 1535 06/19/16 2024  06/20/16 0300 06/20/16 1720 06/20/16 2222 06/21/16 0422  HGB 15.3  --   --   --  13.2 12.9* 12.1* 11.7*  HCT 44.5  --   --   --  39.8 39.9 37.5* 36.3*  PLT 391  --   --   --  391 282 265 236  LABPROT  --  12.4 12.9  --   --   --   --   --   INR  --  0.92 0.97  --   --   --   --   --   HEPARINUNFRC  --   --   --   < > 0.44 <0.10* 0.12* <0.10*  CREATININE 0.96  --   --   --  0.86  --   --   --   < > = values in this interval not displayed.  Estimated Creatinine Clearance: 71.7 mL/min (by C-G formula based on SCr of 0.86 mg/dL).  Assessment: 60 yom s/p L femoral posterior tibial artery bypass in August presented with foot pain. Pt was prescribed warfarin but was not taking it prior to admission.    S/p LLE angiogram/lysis 12/27 with alteplase. Spoke with Dr. Randie Heinzain (vascular) who has d/c'd the cath directed alteplase and now wants to give heparin 250 units/hr in previous tpa catheter, 250 units/hr in sheath and then wants pharmacy to dose systemic heparin through peripheral line.   Heparin level undetectable on 500 units/hr through sheath. Noted pt therapeutic on 900 units/hr peripherally during this admission.   Plan back to OR today - scheduled for 1300.  Goal of Therapy:  Heparin level 0.3-0.7 units/ml Monitor platelets by anticoagulation protocol: Yes   Plan:  Heparin 250 units/hr in previous tpa cath (MD dosing) Heparin 250 units/hr in sheath (MD dosing) Begin heparin gtt at 500 units/hr (running  through peripheral line) F/u 6 hr heparin level  Christoper Fabianaron Laporshia Hogen, PharmD, BCPS Clinical pharmacist, pager [phone] 06/21/2016,5:25 AM

## 2016-06-21 NOTE — Progress Notes (Signed)
Stephen Heinzain MD paged regarding patient's progressive oozing from femoral site along with increasing hematoma. Sandbag pressure applied to femoral site and orders received to reduce Alteplase by half and to report AM lab results when they are received. Will continue to monitor the patient closely.  Marlou PorchBradley Teal Raben

## 2016-06-21 NOTE — Addendum Note (Signed)
Addendum  created 06/21/16 1507 by Eilene GhaziGeorge Mckinnley Smithey, MD   Anesthesia Event edited, Sign clinical note

## 2016-06-21 NOTE — Transfer of Care (Signed)
Immediate Anesthesia Transfer of Care Note  Patient: Freddie ApleyJerome D Ainsley  Procedure(s) Performed: Procedure(s): EMBOLECTOMY left lower extremity. (Left) ANGIOGRAM EXTREMITY LEFT (Left) BYPASS GRAFT POPLITEAL TO TIBIAL USING GORE PROPATEN GRAFT (Left)  Patient Location: PACU  Anesthesia Type:General  Level of Consciousness: awake, alert  and oriented  Airway & Oxygen Therapy: Patient Spontanous Breathing and Patient connected to face mask oxygen  Post-op Assessment: Report given to RN, Post -op Vital signs reviewed and stable and Patient moving all extremities X 4  Post vital signs: Reviewed and stable  Last Vitals:  Vitals:   06/21/16 0800 06/21/16 0820  BP: (!) 171/93   Pulse: 91   Resp: 15   Temp:  37.2 C    Last Pain:  Vitals:   06/21/16 0820  TempSrc: Oral  PainSc:       Patients Stated Pain Goal: 4 (06/21/16 0600)  Complications: No apparent anesthesia complications

## 2016-06-21 NOTE — Progress Notes (Signed)
Stephen Heinzain MD paged the results of patient's AM labs. Orders received. Will continue to monitor the patient closely.  Stephen PorchBradley Jeffie Bowen

## 2016-06-21 NOTE — Op Note (Signed)
Patient name: Stephen ApleyJerome D Bowen MRN: [id number] DOB: June 23, 1956 Sex: male  06/21/2016 Pre-operative Diagnosis: acute left lower extremity ischemia with occluded bypass graft Post-operative diagnosis:  Same Surgeon:  Stephen SalkBrandon C. Randie Heinzain, MD Assistant: Stephen MassedSamantha Rhyne, PA Procedure Performed: 1.  Thromboembolectomy of left femoral to PT bypass graft 2.  Harvest of left small saphenous vein 3.  Bypass from existing graft to distal PT artery with 6mm ringed ptfe with distal vein patch 4.  Left lower extremity angiogram  Indications:   60 year-old male history of critical left lower extremity ischemia underwent left femoral to PT artery bypass which is now occluded. The day prior to this procedure he had attempted catheter thrombolyzes which failed and is now indicated for the above procedure  .Findings: The graft actually had pulsatility in it on initial inspection. We did pull some clot as well as hyperplastic material on thromboembolectomy. Distally angiogram demonstrated patency of the PT filling of most of the foot and following bypass we had runoff through the graft to the level of the ankle. Patient was having ST changes during the procedure.   Procedure:  The patient was identified in the holding area and taken to  the operating room where his placed supine on the operating table general endotracheal anesthesia was induced is given antibiotics sterilely prepped and draped in usual fashion timeout called. We began with reopening our below-knee incision dissected down through significant scar tissue until we identified our graft. This graft was noted to have pulsatility within it. We opened longitudinally and had active antegrade bleeding. We then passed a 4 Fogarty proximally but removed some clot as well as hyperplastic material. We tented this and a Fogarty distally oozing a 3 followed by 2 were unable to return any blood flow. At this time we extended our incision down the leg and retracted our  soleus gastrocnemius muscles posteriorly identified our tibial nerve and our posterior tibial artery. This was dissected out for several centimeters. We then opened longitudinally and did not have significant bleeding attempted embolectomy but also could not return back bleeding. We then performed angiogram which demonstrated flow filling much of the foot. We then decided to perform a redo bypass. On ultrasound we identified the small saphenous vein  made incision over that and dissected out and clipped proximally and distally as an harvested it. This was opened longitudinally and spatulated. The patient was heparinized throughout this time and was redosed hourly. We then sewed our vein patch to the distal PT artery opening with 6-0 Prolene suture. We then turned our attention back to the graft. We had tunneled a ring PTFE 6 mm graft. We then trimmed the graft and sewn end to side to our existing 6 mm graft with cv6 suture and we had antegrade bleeding through  bypass. This was then trimmed to size and sewn end to side to our vein patch angioplasty using again 6-0 Prolene suture.  via the existing sheath in the right groin we then performed left lower extremity angiogram demonstrating brisk flow through our graft to the level of the ankle. At this time the patient was noted to have ST changes and was hypotensive. We performed hemostasis of our lower extremity incision. Ultimately his blood pressure rebounded he was able to get 20 mg of protamine. After hemostasis was obtained we closed the wound in layers with 2-0 Vicryl and 4-0 Monocryl same on the posterior saphenectomy site. Patient blood pressure did stabilize sheath and right groin was exchanged for  a short 5 [place] sheath. He'll be awakened from anesthesia transfer to the PACU and cardiology will be consult.  Should this graft occlude he likely has no further options and will require above-knee amputation.  Blood loss 1200cc  PRBC's 750cc  UOP  275cc  Crystalloid: 2000cc  Albumin: 500cc   Derius Ghosh C. Randie Heinzain, MD [facility] Office: [phone] Pager: [phone]

## 2016-06-21 NOTE — Anesthesia Preprocedure Evaluation (Addendum)
Anesthesia Evaluation  Patient identified by MRN, date of birth, ID band Patient awake    Reviewed: Allergy & Precautions, NPO status , Patient's Chart, lab work & pertinent test results  Airway Mallampati: II  TM Distance: >3 FB Neck ROM: Full    Dental no notable dental hx. (+) Poor Dentition, Dental Advidsory Given   Pulmonary Current Smoker,    Pulmonary exam normal breath sounds clear to auscultation       Cardiovascular hypertension, + Peripheral Vascular Disease  Normal cardiovascular exam Rhythm:Regular Rate:Normal     Neuro/Psych PSYCHIATRIC DISORDERS negative neurological ROS  negative psych ROS   GI/Hepatic negative GI ROS, Neg liver ROS,   Endo/Other  negative endocrine ROS  Renal/GU negative Renal ROS  negative genitourinary   Musculoskeletal negative musculoskeletal ROS (+)   Abdominal   Peds negative pediatric ROS (+)  Hematology negative hematology ROS (+)   Anesthesia Other Findings - Left ventricle: The cavity size was normal. There was moderate concentric hypertrophy. Systolic function was normal. The estimated ejection fraction was in the range of 55% to 60%. Wall motion was normal; there were no regional wall motion abnormalities. Doppler parameters are consistent with abnormal left ventricular relaxation (grade 1 diastolic dysfunction). The E/e&' ratio is between 8-15, suggesting indeterminate LV filling pressure. - Left atrium: The atrium was normal in size. - Inferior vena cava: The vessel was dilated. The respirophasic diameter changes were blunted (< 50%), consistent with elevated central venous pressure.  Impressions:  LVEF 55-60%, moderate LVH, normal wall motion, diastolic   dysfunction, indeterminate LV filling pressure, normal LA size,  dilated IVC.  Reproductive/Obstetrics negative OB ROS                         Anesthesia Physical Anesthesia Plan  ASA:  IV  Anesthesia Plan: General   Post-op Pain Management:    Induction: Intravenous  Airway Management Planned: Oral ETT  Additional Equipment:   Intra-op Plan: Delibrate Circulatory arrest per surgeon request  Post-operative Plan: Extubation in OR  Informed Consent: I have reviewed the patients History and Physical, chart, labs and discussed the procedure including the risks, benefits and alternatives for the proposed anesthesia with the patient or authorized representative who has indicated his/her understanding and acceptance.   Dental advisory given and Dental Advisory Given  Plan Discussed with: CRNA, Surgeon and Anesthesiologist  Anesthesia Plan Comments:      Anesthesia Quick Evaluation

## 2016-06-22 ENCOUNTER — Inpatient Hospital Stay (HOSPITAL_COMMUNITY): Payer: Self-pay

## 2016-06-22 ENCOUNTER — Inpatient Hospital Stay (HOSPITAL_COMMUNITY): Payer: Medicaid Other

## 2016-06-22 ENCOUNTER — Encounter (HOSPITAL_COMMUNITY): Payer: Self-pay | Admitting: Vascular Surgery

## 2016-06-22 DIAGNOSIS — R9431 Abnormal electrocardiogram [ECG] [EKG]: Secondary | ICD-10-CM

## 2016-06-22 DIAGNOSIS — I639 Cerebral infarction, unspecified: Secondary | ICD-10-CM

## 2016-06-22 DIAGNOSIS — I6310 Cerebral infarction due to embolism of unspecified precerebral artery: Secondary | ICD-10-CM

## 2016-06-22 DIAGNOSIS — I10 Essential (primary) hypertension: Secondary | ICD-10-CM

## 2016-06-22 LAB — CBC
HCT: 27.4 % — ABNORMAL LOW (ref 39.0–52.0)
HEMATOCRIT: 24.3 % — AB (ref 39.0–52.0)
HEMOGLOBIN: 8.4 g/dL — AB (ref 13.0–17.0)
Hemoglobin: 9.7 g/dL — ABNORMAL LOW (ref 13.0–17.0)
MCH: 29.8 pg (ref 26.0–34.0)
MCH: 29.6 pg (ref 26.0–34.0)
MCHC: 34.6 g/dL (ref 30.0–36.0)
MCHC: 35.4 g/dL (ref 30.0–36.0)
MCV: 84.3 fL (ref 78.0–100.0)
MCV: 85.6 fL (ref 78.0–100.0)
Platelets: 172 10*3/uL (ref 150–400)
Platelets: 161 10*3/uL (ref 150–400)
RBC: 2.84 MIL/uL — AB (ref 4.22–5.81)
RBC: 3.25 MIL/uL — ABNORMAL LOW (ref 4.22–5.81)
RDW: 14.6 % (ref 11.5–15.5)
RDW: 14 % (ref 11.5–15.5)
WBC: 14.8 10*3/uL — ABNORMAL HIGH (ref 4.0–10.5)
WBC: 15.9 10*3/uL — AB (ref 4.0–10.5)

## 2016-06-22 LAB — BASIC METABOLIC PANEL
ANION GAP: 9 (ref 5–15)
BUN: 12 mg/dL (ref 6–20)
CHLORIDE: 102 mmol/L (ref 101–111)
CO2: 24 mmol/L (ref 22–32)
CREATININE: 1.09 mg/dL (ref 0.61–1.24)
Calcium: 8 mg/dL — ABNORMAL LOW (ref 8.9–10.3)
GFR calc non Af Amer: >60 mL/min (ref >60)
Glucose, Bld: 127 mg/dL — ABNORMAL HIGH (ref 65–99)
POTASSIUM: 4.6 mmol/L (ref 3.5–5.1)
Sodium: 135 mmol/L (ref 135–145)

## 2016-06-22 LAB — CK
Total CK: 6360 U/L — ABNORMAL HIGH (ref 49–397)
Total CK: 9828 U/L — ABNORMAL HIGH (ref 49–397)
Total CK: 7681 U/L — ABNORMAL HIGH (ref 49–397)

## 2016-06-22 LAB — LIPID PANEL
CHOL/HDL RATIO: 3.9 ratio
Cholesterol: 77 mg/dL (ref 0–200)
HDL: 20 mg/dL — AB (ref >40)
LDL CALC: 37 mg/dL (ref 0–99)
TRIGLYCERIDES: 101 mg/dL (ref <150)
VLDL: 20 mg/dL (ref 0–40)

## 2016-06-22 LAB — BLOOD PRODUCT ORDER (VERBAL) VERIFICATION

## 2016-06-22 LAB — TROPONIN I
TROPONIN I: 26.5 ng/mL — AB (ref <0.03)
Troponin I: >65 ng/mL (ref <0.03)
Troponin I: 20.73 ng/mL (ref <0.03)
Troponin I: 19.35 ng/mL (ref <0.03)

## 2016-06-22 LAB — HEPARIN LEVEL (UNFRACTIONATED)
HEPARIN UNFRACTIONATED: 0.35 [IU]/mL (ref 0.30–0.70)
Heparin Unfractionated: 0.42 IU/mL (ref 0.30–0.70)

## 2016-06-22 LAB — PREPARE RBC (CROSSMATCH)

## 2016-06-22 MED ORDER — DEXMEDETOMIDINE HCL IN NACL 200 MCG/50ML IV SOLN
0.2000 ug/kg/h | INTRAVENOUS | Status: AC
  Administered 2016-06-22: 0.3 ug/kg/h via INTRAVENOUS
  Administered 2016-06-22 – 2016-06-23 (×3): 0.7 ug/kg/h via INTRAVENOUS
  Filled 2016-06-22 (×5): qty 50

## 2016-06-22 MED ORDER — METOPROLOL TARTRATE 12.5 MG HALF TABLET
12.5000 mg | ORAL_TABLET | Freq: Two times a day (BID) | ORAL | Status: DC
  Administered 2016-06-23: 12.5 mg via ORAL
  Filled 2016-06-22: qty 1

## 2016-06-22 MED ORDER — SODIUM CHLORIDE 0.9 % IV SOLN
Freq: Once | INTRAVENOUS | Status: AC
  Administered 2016-06-22: 12:00:00 via INTRAVENOUS

## 2016-06-22 MED ORDER — HYDROMORPHONE HCL 1 MG/ML IJ SOLN
1.0000 mg | INTRAMUSCULAR | Status: DC | PRN
  Administered 2016-06-22 – 2016-07-03 (×33): 1 mg via INTRAVENOUS
  Filled 2016-06-22 (×36): qty 1

## 2016-06-22 NOTE — Progress Notes (Signed)
  Progress Note    06/22/2016 8:35 AM 1 Day Post-Op  Subjective:  Remains confused, no pain in left leg  Vitals:   06/22/16 0700 06/22/16 0800  BP: (!) 178/105 (!) 161/76  Pulse: (!) 108 (!) 112  Resp: 18 16  Temp:      Physical Exam: No oriented to person, place or time Hemodynamically stable with tachycardia Abdomen is soft Left foot is mottled and does not appear motor in tact  CBC    Component Value Date/Time   WBC 15.9 (H) 06/22/2016 0050   RBC 2.84 (L) 06/22/2016 0050   HGB 8.4 (L) 06/22/2016 0050   HCT 24.3 (L) 06/22/2016 0050   PLT 172 06/22/2016 0050   MCV 85.6 06/22/2016 0050   MCH 29.6 06/22/2016 0050   MCHC 34.6 06/22/2016 0050   RDW 14.6 06/22/2016 0050   LYMPHSABS 1.1 02/13/2016 1519   MONOABS 0.6 02/13/2016 1519   EOSABS 0.0 02/13/2016 1519   BASOSABS 0.0 02/13/2016 1519    BMET    Component Value Date/Time   NA 135 06/22/2016 0050   K 4.6 06/22/2016 0050   CL 102 06/22/2016 0050   CO2 24 06/22/2016 0050   GLUCOSE 127 (H) 06/22/2016 0050   BUN 12 06/22/2016 0050   CREATININE 1.09 06/22/2016 0050   CREATININE 0.92 11/13/2013 1031   CALCIUM 8.0 (L) 06/22/2016 0050   GFRNONAA >60 06/22/2016 0050   GFRNONAA >89 11/13/2013 1031   GFRAA >60 06/22/2016 0050   GFRAA >89 11/13/2013 1031    INR    Component Value Date/Time   INR 0.97 06/19/2016 2024     Intake/Output Summary (Last 24 hours) at 06/22/16 0835 Last data filed at 06/22/16 0600  Gross per 24 hour  Intake          4517.87 ml  Output             2190 ml  Net          2327.87 ml     Assessment:  60 y.o. male is s/p left lower extremity bypass complicated by stroke and Mi. Troponins have continued to rise but he is hd stable  Plan: On asa and statin Continue heparin anticoagulation Will get echo and carotid duplex He will need left above knee amputation and I have discussed this with his brother Stephen Bowen. Does not appear to be causing significant pain at this time.    Transfuse 2 units prbc's   Stephen Donald C. Randie Heinzain, MD [facility] [place] Office: [phone] Pager: [phone]  06/22/2016 8:35 AM

## 2016-06-22 NOTE — Progress Notes (Signed)
STROKE TEAM PROGRESS NOTE   HISTORY OF PRESENT ILLNESS (per record) Stephen Bowen is an 60 y.o. male who [facility] management of left ischemic foot secondary to occluded left femoral to posterior tibial bypass. He underwent thromboembolectomy of the bypass graft and bypass from existing graft to distal PT artery with 6mm ringed ptfe with distal vein patch. Following the procedure a left lower extremity angiogram demonstrated runoff through the graft to the level of the ankle. During the procedure the patient was having ST changes and cardiology was consulted. The patient on awakening from anesthesia was aphasic and IP Code Stroke was called. Patient was LKW at 9:06 on 06/19/2016 at time of initiation of anesthesia. Patient was not administered IV t-PA.    SUBJECTIVE (INTERVAL HISTORY) His RN is at the bedside.  No family present. L leg super painful.    OBJECTIVE Temp:  [97.9 F (36.6 C)-98.9 F (37.2 C)] 98.9 F (37.2 C) (12/29 0800) Pulse Rate:  [108-139] 114 (12/29 0900) Cardiac Rhythm: (P) Sinus tachycardia;Other (Comment) (12/29 0800) Resp:  [8-22] 15 (12/29 0900) BP: (108-178)/(64-105) 148/99 (12/29 0900) SpO2:  [99 %-100 %] 99 % (12/29 0900)  CBC:  Recent Labs Lab 06/21/16 1607 06/22/16 0050  WBC 15.0* 15.9*  HGB 9.6* 8.4*  HCT 28.2* 24.3*  MCV 86.5 85.6  PLT 157 172    Basic Metabolic Panel:  Recent Labs Lab 06/21/16 0422 06/21/16 1156 06/22/16 0050  NA 132* 134* 135  K 3.7 4.2 4.6  CL 99*  --  102  CO2 25  --  24  GLUCOSE 107* 115* 127*  BUN 6  --  12  CREATININE 0.82  --  1.09  CALCIUM 8.3*  --  8.0*    Lipid Panel:    Component Value Date/Time   CHOL 77 06/22/2016 0050   TRIG 101 06/22/2016 0050   HDL 20 (L) 06/22/2016 0050   CHOLHDL 3.9 06/22/2016 0050   VLDL 20 06/22/2016 0050   LDLCALC 37 06/22/2016 0050   HgbA1c:  Lab Results  Component Value Date   HGBA1C 5.5 02/14/2016   Urine Drug Screen:    Component Value Date/Time    LABOPIA NONE DETECTED 02/13/2016 1538   COCAINSCRNUR NONE DETECTED 02/13/2016 1538   LABBENZ NONE DETECTED 02/13/2016 1538   AMPHETMU NONE DETECTED 02/13/2016 1538   THCU NONE DETECTED 02/13/2016 1538   LABBARB NONE DETECTED 02/13/2016 1538      IMAGING  Ct Head Wo Contrast 06/21/2016 Evidence of acute infarct involving a portion of the posterior mid to inferior right cerebellum as well as much of the left occipital lobe, particularly medially and mid portions. These infarcts involve the posterior circulation ; question embolic phenomenon given the bilateral nature of these infarcts. There is an age uncertain infarct in the left genu and posterior limb internal capsule. There is patchy small vessel disease in the centra semiovale as well as a small prior appearing infarct in the lateral left cerebellum. No hemorrhage, mass, or extra-axial fluid collection. Areas of vascular calcification noted in the carotid siphon regions. Mild paranasal sinus disease noted.   Ct Angio Head W Or Wo Contrast Ct Angio Neck W Or Wo Contrast 06/21/2016 1. Positive for segmental occlusion of the Left PCA (P1 and distal P2 segments), but there is preserved distal PCA branch enhancement. 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 2. No other circle of Willis branch or large vessel occlusion, but there is High-grade stenosis at the Left CCA origin (numerically  estimated at 75% but approaching a Radiographic-String-Sign) related to combined calcified and low-density atherosclerotic plaque. 3. Generalized diminutive appearance of the intracranial circulation felt related to chronic vascular disease. Superimposed mild stenoses at the left ICA siphon, a distal basilar artery, and right PCA. 4. Soft and calcified atherosclerosis at both carotid bifurcations but not hemodynamically significant.    PHYSICAL EXAM Frail and malnourished-looking middle-age Caucasian male currently not in distress but quite restless in bed. Left  lower extremities disfigured and extremity tender to touch with absent pulses. Neurological Exam : Awake alert confused and disoriented. Follows simple midline commands. Speech is clear without dysarthria. Unable to follow two-step commands. Extraocular moments are full range without nystagmus. Patient does not blink to threat on either side. His inability to recognize objects placed directly in front of him. Right pupil is 4 mm and briskly reactive and left is 3 mm and sluggishly reactive. Fundi were not visualized.. Face is symmetric without weakness. Tongue is midline. Motor system exam able to move both upper extremities symmetrically against gravity with normal strength. Left lower extremity moments extremely painful even to touch from below the knee. Right lower extremity strength is normal. ASSESSMENT/PLAN [name] is a 60 y.o. male with history of poorly controlled HTN, HLD admitted with L ischemic foot s/p thromboembolectomy with bypass accompanied by ST changes who developed aphasia post surgery. He did not receive IV t-PA due to recent surgery.   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 Expressive aphasia  CTA head and neck L PCA occlusion with suspected L PCA infarct. High grade stenosis L CCA origin ~75%  Carotid Doppler  R ICA 40-59%, L ICA 60-79%  Repeat CT head tomorrow to confirm/refute stroke  2D Echo  pending   LDL 37  HgbA1c 5.5 in Aug  IV heparin for VTE prophylaxis  Diet NPO time specified  aspirin 325 mg daily and warfarin daily prior to admission, now on aspirin 325 mg daily and heparin IV  Patient counseled to be compliant with his antithrombotic medications  Ongoing aggressive stroke risk factor management  Therapy recommendations:  pending   Disposition:  pending   Hypertension/Hypertensive Urgency  BP as high as 224-116,  208/121  Unstable  Permissive hypertension (OK if < 220/120) but gradually normalize in 5-7 days  Long-term BP goal normotensive  Hyperlipidemia  Home meds:  lipitor 40, resumed in hospital  LDL 37, goal < 70  Continue statin at discharge  Other Stroke Risk Factors  Cigarette smoker, advised to stop smoking  ETOH use, at least 40oz day, advised to drink no more than 2 drink(s) a day  THC use - UDS positive on admission  Family hx stroke (sister)   Coronary artery disease -  acute MI, on IV heparin  PAD - LLE ischemia s/p left lower extremity bypass complicated by stroke and MI this admission  Hospital day # 3  [facility] See Amion for Pager information 06/22/2016 2:36 PM  I have personally examined this patient, reviewed notes, independently viewed imaging studies, participated in medical decision making and plan of care.ROS completed by me personally and pertinent positives fully documented  I have made any additions or clarifications directly to the above note. Agree with note above.  The patient has agitation and altered mental status which is likely multifactorial due to combination of posterior circulation infarcts as well as possible  alcohol withdrawal and is acute MI. Continue IV heparin for anticoagulation for his acute MI. Patient may not hold still to undergo an MRI and hence recommend repeating CT scan of the head and 24 hours. Greater than 50% time during this 35 minute visit was spent on counseling and coordination of care about his stroke Delia HeadyPramod Donnarae Rae, MD Medical Director [facility] Pager: [phone] 06/22/2016 2:48 PM  To contact Stroke Continuity provider, please refer to [email or web address]. After hours, contact General Neurology

## 2016-06-22 NOTE — Progress Notes (Signed)
*  PRELIMINARY RESULTS* Vascular Ultrasound Carotid Duplex (Doppler) has been completed.  Preliminary findings:  Technically difficult study due to constant patient movement. Appears to be 40-59% right ICA stenosis and 60-79% left ICA stenosis. Calcific plaque noted at bilateral ICA bulb area.  Antegrade vertebral flow.    Farrel DemarkJill Eunice, RDMS, RVT  06/22/2016, 10:17 AM

## 2016-06-22 NOTE — Progress Notes (Signed)
SLP Cancellation Note  Patient Details Name: Stephen Bowen MRN: [id number] DOB: Dec 10, 1955   Cancelled treatment:       Reason Eval/Treat Not Completed: Fatigue/lethargy limiting ability to participate. SLP to follow up.  [name], [place] CF-SLP Speech-Language Pathologist [phone]  Stephen Bowen 06/22/2016, 3:57 PM

## 2016-06-22 NOTE — Progress Notes (Signed)
Patient passed bedside swallow eval with no adverse S&S of aspiration or impaired airway protection.  However, patient only intermittently to rarely cooperative with commands. Pt took oral meds but refused water to wash them down. Pt also refused to spit PO meds back out. Patient eventually swallowed PO meds about 15 minutes later. Patient placed NPO until swallow eval by ST and mental status clears.

## 2016-06-22 NOTE — Progress Notes (Signed)
Patient Name: Stephen ApleyJerome D Bowen Date of Encounter: 06/22/2016  [facility] Cardiologist[facility]  [facility] Problem List     Active Problems:   Abnormal EKG   Tobacco use disorder   Essential hypertension   Femoral-tibial bypass graft occlusion, left (HCC)   Hypotension   Stroke (HCC)   Acute MI, anterolateral wall, initial episode of care [facility](HCC)     Subjective   Denies chest discomfort. Conversation is inappropriate. Denies foot pain.  Inpatient Medications    Scheduled Meds: . sodium chloride   Intravenous Once  . aspirin  325 mg Oral Daily  . atorvastatin  40 mg Oral q1800  . mouth rinse  15 mL Mouth Rinse BID  . metoprolol  5 mg Intravenous Q6H  . pantoprazole  40 mg Oral Daily  . sodium chloride flush  3 mL Intravenous Q12H   Continuous Infusions: . sodium chloride 50 mL/hr at 06/21/16 1927  . heparin 800 Units/hr (06/21/16 2246)   PRN Meds: sodium chloride, acetaminophen **OR** acetaminophen, alum & mag hydroxide-simeth, cloNIDine, guaiFENesin-dextromethorphan, hydrALAZINE, metoprolol, morphine injection, ondansetron, oxyCODONE-acetaminophen, phenol, sodium chloride flush   Vital Signs    Vitals:   06/22/16 0800 06/22/16 0900 06/22/16 0942 06/22/16 1005  BP: (!) 161/76 (!) 148/99 (!) 168/90 (!) 224/116  Pulse: (!) 112 (!) 114 81 (!) 127  Resp: 16 15 20 17   Temp: 98.9 F (37.2 C)  98.8 F (37.1 C)   TempSrc: Oral  Oral   SpO2: 100% 99% 100% 100%  Weight:      Height:        Intake/Output Summary (Last 24 hours) at 06/22/16 1107 Last data filed at 06/22/16 [id number]  Gross per 24 hour  Intake          3232.87 ml  Output             1640 ml  Net          1592.87 ml   Filed Weights   06/20/16 1027  Weight: 122 lb 5.7 oz (55.5 kg)    Physical Exam   Malnourished-appearing 60 year old gentleman appearing somewhat confused. GEN: Chronically ill and in no acute distress.  HEENT: Grossly normal.  Neck: Supple, with moderate JVD. No obvious carotid  bruit is heard. Cardiac: A soft S4 gallop is audible. RRR, no murmurs, rubs. No clubbing, cyanosis, edema.   Extremities: Left lower extremity is purple and cold. No palpable pulse Respiratory:  Respirations regular and unlabored, clear to auscultation bilaterally. GI: Soft, nontender, nondistended, BS + x 4. MS: no deformity or atrophy. Skin: warm and dry, no rash. Neuro:  Strength decreased in left leg Psych: AAOx0.  Detached affect.  Labs    CBC  Recent Labs  06/21/16 1607 06/22/16 0050  WBC 15.0* 15.9*  HGB 9.6* 8.4*  HCT 28.2* 24.3*  MCV 86.5 85.6  PLT 157 172   Basic Metabolic Panel  Recent Labs  06/21/16 0422 06/21/16 1156 06/22/16 0050  NA 132* 134* 135  K 3.7 4.2 4.6  CL 99*  --  102  CO2 25  --  24  GLUCOSE 107* 115* 127*  BUN 6  --  12  CREATININE 0.82  --  1.09  CALCIUM 8.3*  --  8.0*   Liver Function Tests  Recent Labs  06/20/16 0300  AST 11*  ALT 10*  ALKPHOS 71  BILITOT 0.2*  PROT 6.7  ALBUMIN 3.2*   No results for input(s): LIPASE, AMYLASE in the last 72 hours. Cardiac Enzymes  Recent Labs  06/21/16 1831 06/22/16 0050 06/22/16 0927  TROPONINI 36.56* >65.00* 26.50*   BNP Invalid input(s): POCBNP D-Dimer No results for input(s): DDIMER in the last 72 hours. Hemoglobin A1C No results for input(s): HGBA1C in the last 72 hours. Fasting Lipid Panel  Recent Labs  06/22/16 0050  CHOL 77  HDL 20*  LDLCALC 37  TRIG 409  CHOLHDL 3.9   Thyroid Function Tests No results for input(s): TSH, T4TOTAL, T3FREE, THYROIDAB in the last 72 hours.  Invalid input(s): FREET3  Telemetry    Sinus tachycardia without significant ventricular ectopy. - Personally Reviewed  ECG    Left ventricular hypertrophy. Sinus rhythm. Resolution of anterior ST elevation. QS pattern V1 and V2.- Personally Reviewed  Radiology    Ct Angio Head W Or Wo Contrast  Result Date: 06/21/2016 CLINICAL DATA:  60 year old male with altered mental status status  post left lower extremity vascular surgery today. Possible new onset blindness. Noncontrast head CT at 1348 hours today suggesting acute left PCA infarct. Initial encounter. EXAM: CT ANGIOGRAPHY HEAD AND NECK TECHNIQUE: Multidetector CT imaging of the head and neck was performed using the standard protocol during bolus administration of intravenous contrast. Multiplanar CT image reconstructions and MIPs were obtained to evaluate the vascular anatomy. Carotid stenosis measurements (when applicable) are obtained utilizing NASCET criteria, using the distal internal carotid diameter as the denominator. CONTRAST:  50 mL Isovue 370 COMPARISON:  Head CT without contrast 1348 hours today. FINDINGS: CTA NECK Skeleton: Poor dentition. Chronic left lateral rib fractures. No acute osseous abnormality identified. Visualized paranasal sinuses and mastoids are stable and well pneumatized. Upper chest: Negative lung apices except for the possibility of emphysema. No superior mediastinal lymphadenopathy. Other neck: Negative thyroid, larynx, pharynx, parapharyngeal spaces, retropharyngeal space, sublingual space, submandibular glands and parotid glands. Visualized orbit soft tissues are within normal limits. Visualized scalp soft tissues are within normal limits. No cervical lymphadenopathy. Aortic arch: 3 vessel arch configuration. Little arch atherosclerosis, however there is great vessel origin atherosclerosis as detailed below. Right carotid system: No brachiocephalic artery or right CCA origin stenosis despite some calcified plaque. Negative right CCA proximal to the bifurcation. At the right carotid bifurcation there is bulky calcified and low-density plaque involving the right ICA origin and bulb. However, stenosis is less than 50 % with respect to the distal vessel. Negative cervical right ICA otherwise. Left carotid system: Soft plaque with High-grade stenosis at the left CCA origin approaching a radiographic string sign,  numerically estimated at 75 % with respect to the distal vessel. See series 5, image 174. The left CCA remains patent. At the left carotid bifurcation there is bulky soft and calcified plaque however, stenosis is less than 50 % with respect to the distal vessel. Negative cervical left ICA otherwise. Vertebral arteries:New line no proximal right subclavian artery stenosis. Right vertebral artery origin is normal. There is calcified right V4 segment plaque which is not hemodynamically significant. The right vertebral artery is patent to the skullbase without stenosis. No proximal left subclavian artery stenosis despite soft and calcified plaque. The left vertebral artery origin is normal. There is compression of the proximal left V2 segment in the transverse foramen related to lower cervical spine degeneration (series 5, image 127), but otherwise no left vertebral artery stenosis in the neck. Intermittent left V2 segment tortuosity. CTA HEAD Posterior circulation: Both distal vertebral arteries appear somewhat diminutive but are patent without stenosis. Patent vertebrobasilar junction. Both PICA origins and AICA origins appear to be patent. Patent  but diminutive basilar artery. Mild distal basilar stenosis. SCA and right PCA origins are patent. There is a fetal type left PCA origin, but the left P1 segment appears likely occluded. There is superimposed distal left P2 short-segment occlusion. See series 10, image 21. Despite these findings there is distal left PCA enhancement. There is associated paucity of cerebral enhancement in the left PCA territory (series 5, image 48). There is mild right PCA branch irregularity. Anterior circulation: Both ICA siphons are patent. Both supraclinoid ICAs are diminutive but patent. There is superimposed mild stenosis of the left supraclinoid ICA. There is left greater than right anterior genu calcified plaque. The posterior communicating artery origins are normal. The carotid  termini, MCA and ACA origins are patent. The anterior communicating artery is diminutive or absent. The bilateral PCA branches are patent. Left MCA M1 segment, bifurcation, and left MCA branches are patent without irregularity. Right MCA M1 segment, bifurcation, and right MCA branches are patent without irregularity. Venous sinuses: Patent. Anatomic variants: Possible fetal type left PCA origin. Review of the MIP images confirms the above findings IMPRESSION: 1. Positive for segmental occlusion of the Left PCA (P1 and distal P2 segments), but there is preserved distal PCA branch enhancement. 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 Preliminary report of these findings discussed by telephone with Dr. Caryl Pina on 06/21/2016 at 16:22 . At 1538 hours. 2. No other circle of Willis branch or large vessel occlusion, but there is High-grade stenosis at the Left CCA origin (numerically estimated at 75% but approaching a Radiographic-String-Sign) related to combined calcified and low-density atherosclerotic plaque. 3. Generalized diminutive appearance of the intracranial circulation felt related to chronic vascular disease. Superimposed mild stenoses at the left ICA siphon, a distal basilar artery, and right PCA. 4. Soft and calcified atherosclerosis at both carotid bifurcations but not hemodynamically significant. Electronically Signed   By: Odessa Fleming M.D.   On: 06/21/2016 16:23   Ct Head Wo Contrast  Result Date: 06/21/2016 CLINICAL DATA:  Altered mental status EXAM: CT HEAD WITHOUT CONTRAST TECHNIQUE: Contiguous axial images were obtained from the base of the skull through the vertex without intravenous contrast. COMPARISON:  None. FINDINGS: Brain: The ventricles are normal in size and configuration. There is no intracranial mass, hemorrhage, extra-axial fluid collection, or midline shift. There is patchy small vessel disease in the centra semiovale bilaterally. There is evidence of an age uncertain small infarct  at the genu and posterior limb of the left internal capsule. There is a prior appearing infarct in the lateral left thalamus. There is decreased attenuation in much of with left occipital lobe, particularly involving the medial to mid portions, felt to be indicative of acute infarct in this area. There is also decreased attenuation in a portion of the posterior mid to lower cerebellum on the right. These areas in the left occipital lobe and right cerebellum appear to represent acute infarcts. Vascular: No appreciable hyperdense vessel. There are foci of calcification in each carotid siphon region. Skull: Bony calvarium appears intact. Sinuses/Orbits: There is a small retention cyst in the lateral left maxillary antrum. There is mucosal thickening in an anterior superior ethmoid air cell on the left. There is a concha bullosa on the left, an anatomic variant. There is rightward deviation nasal septum. Visualized orbits appear symmetric bilaterally. Other: Visualized mastoid air cells are clear. IMPRESSION: Evidence of acute infarct involving a portion of the posterior mid to inferior right cerebellum as well as much of the left occipital lobe, particularly medially  and mid portions. These infarcts involve the posterior circulation ; question embolic phenomenon given the bilateral nature of these infarcts. There is an age uncertain infarct in the left genu and posterior limb internal capsule. There is patchy small vessel disease in the centra semiovale as well as a small prior appearing infarct in the lateral left cerebellum. No hemorrhage, mass, or extra-axial fluid collection. Areas of vascular calcification noted in the carotid siphon regions. Mild paranasal sinus disease noted. These results were called by telephone at the time of interpretation on 06/21/2016 at 2:04 pm to Dr. Doylene Bode, who verbally acknowledged these results. Electronically Signed   By: Bretta Bang III M.D.   On: 06/21/2016 14:04    Ct Angio Neck W Or Wo Contrast  Result Date: 06/21/2016 CLINICAL DATA:  60 year old male with altered mental status status post left lower extremity vascular surgery today. Possible new onset blindness. Noncontrast head CT at 1348 hours today suggesting acute left PCA infarct. Initial encounter. EXAM: CT ANGIOGRAPHY HEAD AND NECK TECHNIQUE: Multidetector CT imaging of the head and neck was performed using the standard protocol during bolus administration of intravenous contrast. Multiplanar CT image reconstructions and MIPs were obtained to evaluate the vascular anatomy. Carotid stenosis measurements (when applicable) are obtained utilizing NASCET criteria, using the distal internal carotid diameter as the denominator. CONTRAST:  50 mL Isovue 370 COMPARISON:  Head CT without contrast 1348 hours today. FINDINGS: CTA NECK Skeleton: Poor dentition. Chronic left lateral rib fractures. No acute osseous abnormality identified. Visualized paranasal sinuses and mastoids are stable and well pneumatized. Upper chest: Negative lung apices except for the possibility of emphysema. No superior mediastinal lymphadenopathy. Other neck: Negative thyroid, larynx, pharynx, parapharyngeal spaces, retropharyngeal space, sublingual space, submandibular glands and parotid glands. Visualized orbit soft tissues are within normal limits. Visualized scalp soft tissues are within normal limits. No cervical lymphadenopathy. Aortic arch: 3 vessel arch configuration. Little arch atherosclerosis, however there is great vessel origin atherosclerosis as detailed below. Right carotid system: No brachiocephalic artery or right CCA origin stenosis despite some calcified plaque. Negative right CCA proximal to the bifurcation. At the right carotid bifurcation there is bulky calcified and low-density plaque involving the right ICA origin and bulb. However, stenosis is less than 50 % with respect to the distal vessel. Negative cervical right ICA  otherwise. Left carotid system: Soft plaque with High-grade stenosis at the left CCA origin approaching a radiographic string sign, numerically estimated at 75 % with respect to the distal vessel. See series 5, image 174. The left CCA remains patent. At the left carotid bifurcation there is bulky soft and calcified plaque however, stenosis is less than 50 % with respect to the distal vessel. Negative cervical left ICA otherwise. Vertebral arteries:New line no proximal right subclavian artery stenosis. Right vertebral artery origin is normal. There is calcified right V4 segment plaque which is not hemodynamically significant. The right vertebral artery is patent to the skullbase without stenosis. No proximal left subclavian artery stenosis despite soft and calcified plaque. The left vertebral artery origin is normal. There is compression of the proximal left V2 segment in the transverse foramen related to lower cervical spine degeneration (series 5, image 127), but otherwise no left vertebral artery stenosis in the neck. Intermittent left V2 segment tortuosity. CTA HEAD Posterior circulation: Both distal vertebral arteries appear somewhat diminutive but are patent without stenosis. Patent vertebrobasilar junction. Both PICA origins and AICA origins appear to be patent. Patent but diminutive basilar artery. Mild distal basilar stenosis.  SCA and right PCA origins are patent. There is a fetal type left PCA origin, but the left P1 segment appears likely occluded. There is superimposed distal left P2 short-segment occlusion. See series 10, image 21. Despite these findings there is distal left PCA enhancement. There is associated paucity of cerebral enhancement in the left PCA territory (series 5, image 48). There is mild right PCA branch irregularity. Anterior circulation: Both ICA siphons are patent. Both supraclinoid ICAs are diminutive but patent. There is superimposed mild stenosis of the left supraclinoid ICA. There  is left greater than right anterior genu calcified plaque. The posterior communicating artery origins are normal. The carotid termini, MCA and ACA origins are patent. The anterior communicating artery is diminutive or absent. The bilateral PCA branches are patent. Left MCA M1 segment, bifurcation, and left MCA branches are patent without irregularity. Right MCA M1 segment, bifurcation, and right MCA branches are patent without irregularity. Venous sinuses: Patent. Anatomic variants: Possible fetal type left PCA origin. Review of the MIP images confirms the above findings IMPRESSION: 1. Positive for segmental occlusion of the Left PCA (P1 and distal P2 segments), but there is preserved distal PCA branch enhancement. 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 Preliminary report of these findings discussed by telephone with Dr. Caryl Pina on 06/21/2016 at 16:22 . At 1538 hours. 2. No other circle of Willis branch or large vessel occlusion, but there is High-grade stenosis at the Left CCA origin (numerically estimated at 75% but approaching a Radiographic-String-Sign) related to combined calcified and low-density atherosclerotic plaque. 3. Generalized diminutive appearance of the intracranial circulation felt related to chronic vascular disease. Superimposed mild stenoses at the left ICA siphon, a distal basilar artery, and right PCA. 4. Soft and calcified atherosclerosis at both carotid bifurcations but not hemodynamically significant. Electronically Signed   By: Odessa Fleming M.D.   On: 06/21/2016 16:23   Dg Ang/ext/uni/or Left  Result Date: 06/21/2016 CLINICAL DATA:  60 year old male with acute left lower extremity ischemia EXAM: LEFT ANG/EXT/UNI/ OR CONTRAST:  Op note FLUOROSCOPY TIME:  Op note COMPARISON:  None. FINDINGS: Two intraoperative fluoroscopic spot images of left lower extremity angiogram demonstrates partial opacification of left femoral distal bypass, with partial opacification of tibial vessels.  IMPRESSION: Intraoperative left lower extremity angiogram for acute leg ischemia, as above. Please refer to the dictated operative report for full details of intraoperative findings and procedure. Signed, Yvone Neu. Loreta Ave, DO [facility] Electronically Signed   By: Gilmer Mor D.O.   On: 06/21/2016 12:58    Cardiac Studies   No new cardiac data. Needs to have an echocardiogram performed to assess residual LV function.  Patient Profile     60 year old with vasculopathic history that now includes left lower extremity ischemic necrosis, postoperative acute stroke with speech difficulty, and simultaneous development of anterior ST elevation myocardial infarction during surgical thrombectomy of left lower extremity. Evidence of failed improvement in left lower extremity blood flow. May need AKA.  Assessment & Plan    1. Acute anterior ST elevation infarction with significant enzyme elevation. ECG has not shown significant evolutionary changes suggesting the possibility of spontaneous reperfusion or collateral recruitment. He needs a 2-D Doppler echocardiogram to assess residual LV function. Need to start therapy with low-dose beta blocker and consider ACE inhibitor therapy based upon blood pressure and kidney function. 2. 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 3. Critical limb ischemia, left lower extremity with evidence of necrosis and failed attempts at revascularization.  4. Alcohol abuse and at risk for development of withdrawal.  Overall prognosis is very poor for any one of the 4 above problems. Plan medical management of coronary disease and clinical follow-up daily.  Signed, Lesleigh NoeHenry W Eleazar Kimmey III, MD  06/22/2016, 11:07 AM

## 2016-06-22 NOTE — Progress Notes (Addendum)
   The echocardiogram has not yet been completed.  Dr. Randie Heinzain requested feedback from our service concerning timing of amputation/general anesthesia following cardiac event.  We need to determine residual LV function from echo. I would wait as long as possible before subjecting the patient to the stress of surgery.  I also feel that he is at risk for recurrent acute ischemia because of the "rapid washout" of his cardiac markers and the near normalization of his 12-lead EKG post infarct. My clinical suspicion is that he acutely occluded the LAD and had spontaneous recanalization (or rapid collateral recruitment).

## 2016-06-22 NOTE — Progress Notes (Signed)
Copious Serous drainage leaking form proximal end of medial LLE incision. Abdomial pad and Tape dressing placed.  Pt continues to show agitation S&S of pain despite 2 doses of morphine and 2 doses of dilaudid given over last 3 hour. Precedex has been started as well.   Pt is a poor historian and is unable to define or verbally tell RN what and where it hurts.  Will continue to assess nonverbal S&S of pain and withdrawl

## 2016-06-22 NOTE — Progress Notes (Signed)
ANTICOAGULATION CONSULT NOTE - Follow Up Consult  Pharmacy Consult for Heparin Indication: S/P thrombectomy, new stroke  No Known Allergies  Patient Measurements: Height: 5\' 8"  (172.7 cm) Weight: 122 lb 5.7 oz (55.5 kg) IBW/kg (Calculated) : 68.4 Heparin Dosing Weight: 55 kg  Vital Signs: Temp: 99.3 F (37.4 C) (12/29 1500) Temp Source: Oral (12/29 1500) BP: 131/83 (12/29 1800) Pulse Rate: 101 (12/29 1800)  Labs:  Recent Labs  06/19/16 2024  06/20/16 0300  06/21/16 0422  06/21/16 1607  06/22/16 0050 06/22/16 0703 06/22/16 0927 06/22/16 1634  HGB  --   --  13.2  < > 11.7*  < > 9.6*  --  8.4*  --   --  9.7*  HCT  --   --  39.8  < > 36.3*  < > 28.2*  --  24.3*  --   --  27.4*  PLT  --   --  391  < > 236  --  157  --  172  --   --  161  LABPROT 12.9  --   --   --   --   --   --   --   --   --   --   --   INR 0.97  --   --   --   --   --   --   --   --   --   --   --   HEPARINUNFRC  --   < > 0.44  < > <0.10*  --   --   --   --  0.42  --  0.35  CREATININE  --   --  0.86  --  0.82  --   --   --  1.09  --   --   --   CKTOTAL  --   --   --   --   --   --   --   --   --   --  6,360* 9,828*  TROPONINI  --   --   --   --   --   --   --   < > >65.00*  --  26.50* 20.73*  < > = values in this interval not displayed.  Estimated Creatinine Clearance: 56.6 mL/min (by C-G formula based on SCr of 1.09 mg/dL).  Assessment: 60yo male on heparin for arterial occlusion.  Now s/p cath directed alteplase 12/27 & thrombectomy of L-femoral to PT BPG and Intraoperatively he developed ST elevation with post-op AMS.  Code STEMI initially called then cancelled( but will need cath) and Code Stroke called- CT(+) evolving embolic stroke and s/p thromboembolectomy of left femoral to PT bypass graft on 12/28.  Heparin level remains therapeutic (0.35) this afternoon on 800 units/hr. Slight decrease from 0.42 on same rate earlier today remains within low therapeutic goal range.  Goal of Therapy:  Heparin  level 0.3-0.5 units/ml,  Monitor platelets by anticoagulation protocol: Yes   Plan:  Continue heparin at 800 units/hr Daily heparin level and CBC Will follow oral anticoagulation plans  Marya Landrygan, Aury Scollard Donovan, RPh Pager: [phone]  06/22/2016 6:27 PM

## 2016-06-22 NOTE — Progress Notes (Signed)
ANTICOAGULATION CONSULT NOTE - Follow Up Consult  Pharmacy Consult for Heparin Indication: S/P thrombectomy, new stroke  No Known Allergies  Patient Measurements: Height: 5\' 8"  (172.7 cm) Weight: 122 lb 5.7 oz (55.5 kg) IBW/kg (Calculated) : 68.4 Heparin Dosing Weight: 55  Vital Signs: Temp: 98.8 F (37.1 C) (12/29 0942) Temp Source: Oral (12/29 0942) BP: 224/116 (12/29 1005) Pulse Rate: 127 (12/29 1005)  Labs:  Recent Labs  06/19/16 1535 06/19/16 2024  06/20/16 0300  06/20/16 2222 06/21/16 0422 06/21/16 1156 06/21/16 1607 06/21/16 1831 06/22/16 0050 06/22/16 0703 06/22/16 0927  HGB  --   --   --  13.2  < > 12.1* 11.7* 7.8* 9.6*  --  8.4*  --   --   HCT  --   --   --  39.8  < > 37.5* 36.3* 23.0* 28.2*  --  24.3*  --   --   PLT  --   --   --  391  < > 265 236  --  157  --  172  --   --   LABPROT 12.4 12.9  --   --   --   --   --   --   --   --   --   --   --   INR 0.92 0.97  --   --   --   --   --   --   --   --   --   --   --   HEPARINUNFRC  --   --   < > 0.44  < > 0.12* <0.10*  --   --   --   --  0.42  --   CREATININE  --   --   --  0.86  --   --  0.82  --   --   --  1.09  --   --   TROPONINI  --   --   --   --   --   --   --   --   --  36.56* >65.00*  --  26.50*  < > = values in this interval not displayed.  Estimated Creatinine Clearance: 56.6 mL/min (by C-G formula based on SCr of 1.09 mg/dL).   Medications:  Scheduled:  . sodium chloride   Intravenous Once  . aspirin  325 mg Oral Daily  . atorvastatin  40 mg Oral q1800  . mouth rinse  15 mL Mouth Rinse BID  . metoprolol  5 mg Intravenous Q6H  . pantoprazole  40 mg Oral Daily  . sodium chloride flush  3 mL Intravenous Q12H    Assessment: 60yo male on heparin for arterial occlusion.  Now s/p cath directed alteplase 12/27 & thrombectomy of L-femoral to PT BPG and Intraoperatively he developed ST elevation with post-op AMS.  Code STEMI initially called then cancelled( but will need cath) and Code Stroke  called- CT(+)evolving embolic stroke and s/p thromboembolectomy of left femoral to PT bypass graft on 12/28. -Heparin level= 0.42  Goal of Therapy:  Heparin level 0.3-0.5 units/ml,  Monitor platelets by anticoagulation protocol: Yes   Plan:  Continue heparin at 800 units/hr Will confirm a heparin level today Daily heparin level, CBC Will follow oral anticoagulation plans  Harland Germanndrew Chanz Cahall, Pharm D 06/22/2016 11:13 AM

## 2016-06-23 ENCOUNTER — Inpatient Hospital Stay (HOSPITAL_COMMUNITY): Payer: Medicaid Other

## 2016-06-23 DIAGNOSIS — I63432 Cerebral infarction due to embolism of left posterior cerebral artery: Secondary | ICD-10-CM

## 2016-06-23 DIAGNOSIS — R4701 Aphasia: Secondary | ICD-10-CM

## 2016-06-23 DIAGNOSIS — R9431 Abnormal electrocardiogram [ECG] [EKG]: Secondary | ICD-10-CM

## 2016-06-23 LAB — CBC
HCT: 22.4 % — ABNORMAL LOW (ref 39.0–52.0)
Hemoglobin: 8.1 g/dL — ABNORMAL LOW (ref 13.0–17.0)
MCH: 30.3 pg (ref 26.0–34.0)
MCHC: 36.2 g/dL — ABNORMAL HIGH (ref 30.0–36.0)
MCV: 83.9 fL (ref 78.0–100.0)
PLATELETS: 155 10*3/uL (ref 150–400)
RBC: 2.67 MIL/uL — AB (ref 4.22–5.81)
RDW: 14.2 % (ref 11.5–15.5)
WBC: 11.4 10*3/uL — ABNORMAL HIGH (ref 4.0–10.5)

## 2016-06-23 LAB — HEPARIN LEVEL (UNFRACTIONATED): Heparin Unfractionated: 0.42 IU/mL (ref 0.30–0.70)

## 2016-06-23 LAB — BASIC METABOLIC PANEL
ANION GAP: 6 (ref 5–15)
BUN: 12 mg/dL (ref 6–20)
CALCIUM: 7.9 mg/dL — AB (ref 8.9–10.3)
CHLORIDE: 103 mmol/L (ref 101–111)
CO2: 26 mmol/L (ref 22–32)
Creatinine, Ser: 0.88 mg/dL (ref 0.61–1.24)
GFR calc Af Amer: >60 mL/min (ref >60)
GFR calc non Af Amer: >60 mL/min (ref >60)
GLUCOSE: 98 mg/dL (ref 65–99)
Potassium: 4.3 mmol/L (ref 3.5–5.1)
Sodium: 135 mmol/L (ref 135–145)

## 2016-06-23 LAB — CK
CK TOTAL: 6214 U/L — AB (ref 49–397)
Total CK: 6240 U/L — ABNORMAL HIGH (ref 49–397)
Total CK: 5657 U/L — ABNORMAL HIGH (ref 49–397)
Total CK: 5481 U/L — ABNORMAL HIGH (ref 49–397)

## 2016-06-23 LAB — ECHOCARDIOGRAM COMPLETE
Height: 68 in
Weight: 1957.68 oz

## 2016-06-23 MED ORDER — METOPROLOL TARTRATE 5 MG/5ML IV SOLN
5.0000 mg | Freq: Four times a day (QID) | INTRAVENOUS | Status: DC | PRN
  Administered 2016-06-27: 5 mg via INTRAVENOUS
  Filled 2016-06-23 (×2): qty 5

## 2016-06-23 MED ORDER — DEXMEDETOMIDINE HCL IN NACL 200 MCG/50ML IV SOLN
0.2000 ug/kg/h | INTRAVENOUS | Status: AC
  Administered 2016-06-23 – 2016-06-24 (×3): 0.7 ug/kg/h via INTRAVENOUS
  Filled 2016-06-23 (×3): qty 50

## 2016-06-23 MED ORDER — METOPROLOL TARTRATE 25 MG PO TABS
25.0000 mg | ORAL_TABLET | Freq: Two times a day (BID) | ORAL | Status: DC
  Administered 2016-06-23 – 2016-06-27 (×7): 25 mg via ORAL
  Filled 2016-06-23 (×7): qty 1

## 2016-06-23 NOTE — Progress Notes (Signed)
  Echocardiogram 2D Echocardiogram has been performed.  Janalyn HarderWest, Mateusz Neilan R 06/23/2016, 12:03 PM

## 2016-06-23 NOTE — Progress Notes (Signed)
ANTICOAGULATION CONSULT NOTE - Follow Up Consult  Pharmacy Consult for Heparin Indication: S/P thrombectomy, new stroke  No Known Allergies  Patient Measurements: Height: 5\' 8"  (172.7 cm) Weight: 122 lb 5.7 oz (55.5 kg) IBW/kg (Calculated) : 68.4 Heparin Dosing Weight: 55  Vital Signs: Temp: 98.3 F (36.8 C) (12/30 1205) Temp Source: Oral (12/30 1205) BP: 162/76 (12/30 1100) Pulse Rate: 77 (12/30 1100)  Labs:  Recent Labs  06/21/16 0422  06/22/16 0050 06/22/16 0703  06/22/16 0927 06/22/16 1634 06/22/16 2048 06/23/16 0336 06/23/16 0902  HGB 11.7*  < > 8.4*  --   --   --  9.7*  --  8.1*  --   HCT 36.3*  < > 24.3*  --   --   --  27.4*  --  22.4*  --   PLT 236  < > 172  --   --   --  161  --  155  --   HEPARINUNFRC <0.10*  --   --  0.42  --   --  0.35  --  0.42  --   CREATININE 0.82  --  1.09  --   --   --   --   --  0.88  --   CKTOTAL  --   --   --   --   < > 6,360* 9,828* 7,681* 6,240* 5,657*  TROPONINI  --   < > >65.00*  --   --  26.50* 20.73* 19.35*  --   --   < > = values in this interval not displayed.  Estimated Creatinine Clearance: 70.1 mL/min (by C-G formula based on SCr of 0.88 mg/dL).   Medications:  Scheduled:  . aspirin  325 mg Oral Daily  . atorvastatin  40 mg Oral q1800  . mouth rinse  15 mL Mouth Rinse BID  . metoprolol  5 mg Intravenous Q6H  . metoprolol tartrate  12.5 mg Oral BID  . pantoprazole  40 mg Oral Daily  . sodium chloride flush  3 mL Intravenous Q12H    Assessment: 60yo male on heparin for arterial occlusion.  Now s/p cath directed alteplase 12/27 & thrombectomy of L-femoral to PT BPG and Intraoperatively he developed ST elevation with post-op AMS.  Code STEMI initially called then cancelled( but will need cath) and Code Stroke called- CT(+)evolving embolic stroke and s/p thromboembolectomy of left femoral to PT bypass graft on 12/28.  Per notes at risk for hemorrhagic transformation and per vascular: will need a left AKA  -Heparin  level= 0.42  Goal of Therapy:  Heparin level 0.3-0.5 units/ml,  Monitor platelets by anticoagulation protocol: Yes   Plan:  Continue heparin at 800 units/hr Daily heparin level, CBC Will follow procedural plans  Harland Germanndrew Alhaji Mcneal, Pharm D 06/23/2016 2:41 PM

## 2016-06-23 NOTE — Progress Notes (Signed)
    Subjective  -  Does not state he has pain   Physical Exam:  Very confused and unable to properly communicate Left leg is frankly ischemic with fluid filled bulla, clearly non-viable    CK and troponin trending down Creatinine stable and normal WBC   Assessment/Plan:    The patient is going to need a left AKA in the immediate future.  I will await cardiology recommendations.  Obviously he is very high risk for complications given his recent MI and CVA.  However, if he does not have an amputation in the near future, I suspect he will develop systemic complications given the ischemic nature of his leg.  I will speak with his family later today when they arrive.  Durene CalBrabham, [place] 06/23/2016 10:45 AM --  Vitals:   06/23/16 0802 06/23/16 0900  BP:  (!) 144/84  Pulse:  73  Resp:  14  Temp: (!) 96.7 F (35.9 C)     Intake/Output Summary (Last 24 hours) at 06/23/16 1045 Last data filed at 06/23/16 0800  Gross per 24 hour  Intake          2054.54 ml  Output             1055 ml  Net           999.54 ml     Laboratory CBC    Component Value Date/Time   WBC 11.4 (H) 06/23/2016 0336   HGB 8.1 (L) 06/23/2016 0336   HCT 22.4 (L) 06/23/2016 0336   PLT 155 06/23/2016 0336    BMET    Component Value Date/Time   NA 135 06/23/2016 0336   K 4.3 06/23/2016 0336   CL 103 06/23/2016 0336   CO2 26 06/23/2016 0336   GLUCOSE 98 06/23/2016 0336   BUN 12 06/23/2016 0336   CREATININE 0.88 06/23/2016 0336   CREATININE 0.92 11/13/2013 1031   CALCIUM 7.9 (L) 06/23/2016 0336   GFRNONAA >60 06/23/2016 0336   GFRNONAA >89 11/13/2013 1031   GFRAA >60 06/23/2016 0336   GFRAA >89 11/13/2013 1031    COAG Lab Results  Component Value Date   INR 0.97 06/19/2016   INR 0.92 06/19/2016   INR 1.63 02/19/2016   No results found for: PTT  Antibiotics Anti-infectives    Start     Dose/Rate Route Frequency Ordered Stop   06/20/16 1730  ceFAZolin (ANCEF) IVPB 2g/100 mL premix     2 g 200 mL/hr over 30 Minutes Intravenous Every 8 hours 06/20/16 1621 06/20/16 1814       V. Charlena CrossWells Brabham IV, M.D. [facility] [place] Office: [phone] Pager:  [phone]

## 2016-06-23 NOTE — Progress Notes (Addendum)
Patient Name: Stephen Bowen Date of Encounter: 06/23/2016  Primary Cardiologist: [facility] Problem List     Active Problems:   Abnormal EKG   Tobacco use disorder   Essential hypertension   Femoral-tibial bypass graft occlusion, left (HCC)   Hypotension   Stroke (HCC)   Acute MI, anterolateral wall, initial episode of care [facility])   Aphasia     Subjective   Denies chest discomfort. Conversation is inappropriate. Denies foot pain.  Inpatient Medications    Scheduled Meds: . aspirin  325 mg Oral Daily  . atorvastatin  40 mg Oral q1800  . mouth rinse  15 mL Mouth Rinse BID  . metoprolol  5 mg Intravenous Q6H  . metoprolol tartrate  12.5 mg Oral BID  . pantoprazole  40 mg Oral Daily  . sodium chloride flush  3 mL Intravenous Q12H   Continuous Infusions: . sodium chloride 50 mL (06/22/16 2036)  . heparin 800 Units/hr (06/23/16 0536)   PRN Meds: sodium chloride, acetaminophen **OR** acetaminophen, alum & mag hydroxide-simeth, guaiFENesin-dextromethorphan, hydrALAZINE, HYDROmorphone (DILAUDID) injection, metoprolol, morphine injection, ondansetron, oxyCODONE-acetaminophen, phenol, sodium chloride flush   Vital Signs    Vitals:   06/23/16 0802 06/23/16 0900 06/23/16 1000 06/23/16 1100  BP:  (!) 144/84 118/69 (!) 162/76  Pulse:  73 70 77  Resp:  14 11 (!) 22  Temp: (!) 96.7 F (35.9 C)     TempSrc: Axillary     SpO2:  100% 99% 100%  Weight:      Height:        Intake/Output Summary (Last 24 hours) at 06/23/16 1147 Last data filed at 06/23/16 1100  Gross per 24 hour  Intake          1695.54 ml  Output              955 ml  Net           740.54 ml   Filed Weights   06/20/16 1027  Weight: 122 lb 5.7 oz (55.5 kg)    Physical Exam   Malnourished-appearing 60 year old gentleman appearing somewhat confused. GEN: Chronically ill and in no acute distress.  HEENT: Grossly normal.  Neck: Supple, with moderate JVD. No obvious carotid bruit is  heard. Cardiac: RRR, no murmurs, rubs. No clubbing, cyanosis, edema.  No S4 heard but patient talking during exam Extremities: Left lower extremity is purple and cold. No palpable pulse Respiratory:  Respirations regular and unlabored, clear to auscultation bilaterally. GI: Soft, nontender, nondistended, BS + x 4. MS: no deformity or atrophy. Skin: warm and dry, no rash. Neuro:  Strength decreased in left leg Psych: AAOx0.  Detached affect.  Labs    CBC  Recent Labs  06/22/16 1634 06/23/16 0336  WBC 14.8* 11.4*  HGB 9.7* 8.1*  HCT 27.4* 22.4*  MCV 84.3 83.9  PLT 161 155   Basic Metabolic Panel  Recent Labs  06/22/16 0050 06/23/16 0336  NA 135 135  K 4.6 4.3  CL 102 103  CO2 24 26  GLUCOSE 127* 98  BUN 12 12  CREATININE 1.09 0.88  CALCIUM 8.0* 7.9*   Liver Function Tests No results for input(s): AST, ALT, ALKPHOS, BILITOT, PROT, ALBUMIN in the last 72 hours. No results for input(s): LIPASE, AMYLASE in the last 72 hours. Cardiac Enzymes  Recent Labs  06/22/16 0927 06/22/16 1634 06/22/16 2048 06/23/16 0336 06/23/16 0902  CKTOTAL 6,360* 9,828* 7,681* 6,240* 5,657*  TROPONINI 26.50* 20.73* 19.35*  --   --  BNP Invalid input(s): POCBNP D-Dimer No results for input(s): DDIMER in the last 72 hours. Hemoglobin A1C No results for input(s): HGBA1C in the last 72 hours. Fasting Lipid Panel  Recent Labs  06/22/16 0050  CHOL 77  HDL 20*  LDLCALC 37  TRIG 161  CHOLHDL 3.9   Thyroid Function Tests No results for input(s): TSH, T4TOTAL, T3FREE, THYROIDAB in the last 72 hours.  Invalid input(s): FREET3  Telemetry    Sinus tachycardia without significant ventricular ectopy. - Personally Reviewed  ECG    Left ventricular hypertrophy. Sinus rhythm. Resolution of anterior ST elevation. QS pattern V1 and V2.- Personally Reviewed  Radiology    Ct Angio Head W Or Wo Contrast  Result Date: 06/21/2016 CLINICAL DATA:  59 year old male with altered mental  status status post left lower extremity vascular surgery today. Possible new onset blindness. Noncontrast head CT at 1348 hours today suggesting acute left PCA infarct. Initial encounter. EXAM: CT ANGIOGRAPHY HEAD AND NECK TECHNIQUE: Multidetector CT imaging of the head and neck was performed using the standard protocol during bolus administration of intravenous contrast. Multiplanar CT image reconstructions and MIPs were obtained to evaluate the vascular anatomy. Carotid stenosis measurements (when applicable) are obtained utilizing NASCET criteria, using the distal internal carotid diameter as the denominator. CONTRAST:  50 mL Isovue 370 COMPARISON:  Head CT without contrast 1348 hours today. FINDINGS: CTA NECK Skeleton: Poor dentition. Chronic left lateral rib fractures. No acute osseous abnormality identified. Visualized paranasal sinuses and mastoids are stable and well pneumatized. Upper chest: Negative lung apices except for the possibility of emphysema. No superior mediastinal lymphadenopathy. Other neck: Negative thyroid, larynx, pharynx, parapharyngeal spaces, retropharyngeal space, sublingual space, submandibular glands and parotid glands. Visualized orbit soft tissues are within normal limits. Visualized scalp soft tissues are within normal limits. No cervical lymphadenopathy. Aortic arch: 3 vessel arch configuration. Little arch atherosclerosis, however there is great vessel origin atherosclerosis as detailed below. Right carotid system: No brachiocephalic artery or right CCA origin stenosis despite some calcified plaque. Negative right CCA proximal to the bifurcation. At the right carotid bifurcation there is bulky calcified and low-density plaque involving the right ICA origin and bulb. However, stenosis is less than 50 % with respect to the distal vessel. Negative cervical right ICA otherwise. Left carotid system: Soft plaque with High-grade stenosis at the left CCA origin approaching a radiographic  string sign, numerically estimated at 75 % with respect to the distal vessel. See series 5, image 174. The left CCA remains patent. At the left carotid bifurcation there is bulky soft and calcified plaque however, stenosis is less than 50 % with respect to the distal vessel. Negative cervical left ICA otherwise. Vertebral arteries:New line no proximal right subclavian artery stenosis. Right vertebral artery origin is normal. There is calcified right V4 segment plaque which is not hemodynamically significant. The right vertebral artery is patent to the skullbase without stenosis. No proximal left subclavian artery stenosis despite soft and calcified plaque. The left vertebral artery origin is normal. There is compression of the proximal left V2 segment in the transverse foramen related to lower cervical spine degeneration (series 5, image 127), but otherwise no left vertebral artery stenosis in the neck. Intermittent left V2 segment tortuosity. CTA HEAD Posterior circulation: Both distal vertebral arteries appear somewhat diminutive but are patent without stenosis. Patent vertebrobasilar junction. Both PICA origins and AICA origins appear to be patent. Patent but diminutive basilar artery. Mild distal basilar stenosis. SCA and right PCA origins are patent. There  is a fetal type left PCA origin, but the left P1 segment appears likely occluded. There is superimposed distal left P2 short-segment occlusion. See series 10, image 21. Despite these findings there is distal left PCA enhancement. There is associated paucity of cerebral enhancement in the left PCA territory (series 5, image 48). There is mild right PCA branch irregularity. Anterior circulation: Both ICA siphons are patent. Both supraclinoid ICAs are diminutive but patent. There is superimposed mild stenosis of the left supraclinoid ICA. There is left greater than right anterior genu calcified plaque. The posterior communicating artery origins are normal. The  carotid termini, MCA and ACA origins are patent. The anterior communicating artery is diminutive or absent. The bilateral PCA branches are patent. Left MCA M1 segment, bifurcation, and left MCA branches are patent without irregularity. Right MCA M1 segment, bifurcation, and right MCA branches are patent without irregularity. Venous sinuses: Patent. Anatomic variants: Possible fetal type left PCA origin. Review of the MIP images confirms the above findings IMPRESSION: 1. Positive for segmental occlusion of the Left PCA (P1 and distal P2 segments), but there is preserved distal PCA branch enhancement. 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 Preliminary report of these findings discussed by telephone with Dr. Caryl PinaERIC LINDZEN on 06/21/2016 at 16:22 . At 1538 hours. 2. No other circle of Willis branch or large vessel occlusion, but there is High-grade stenosis at the Left CCA origin (numerically estimated at 75% but approaching a Radiographic-String-Sign) related to combined calcified and low-density atherosclerotic plaque. 3. Generalized diminutive appearance of the intracranial circulation felt related to chronic vascular disease. Superimposed mild stenoses at the left ICA siphon, a distal basilar artery, and right PCA. 4. Soft and calcified atherosclerosis at both carotid bifurcations but not hemodynamically significant. Electronically Signed   By: Odessa FlemingH  Hall M.D.   On: 06/21/2016 16:23   Ct Head Wo Contrast  Result Date: 06/21/2016 CLINICAL DATA:  Altered mental status EXAM: CT HEAD WITHOUT CONTRAST TECHNIQUE: Contiguous axial images were obtained from the base of the skull through the vertex without intravenous contrast. COMPARISON:  None. FINDINGS: Brain: The ventricles are normal in size and configuration. There is no intracranial mass, hemorrhage, extra-axial fluid collection, or midline shift. There is patchy small vessel disease in the centra semiovale bilaterally. There is evidence of an age uncertain small  infarct at the genu and posterior limb of the left internal capsule. There is a prior appearing infarct in the lateral left thalamus. There is decreased attenuation in much of with left occipital lobe, particularly involving the medial to mid portions, felt to be indicative of acute infarct in this area. There is also decreased attenuation in a portion of the posterior mid to lower cerebellum on the right. These areas in the left occipital lobe and right cerebellum appear to represent acute infarcts. Vascular: No appreciable hyperdense vessel. There are foci of calcification in each carotid siphon region. Skull: Bony calvarium appears intact. Sinuses/Orbits: There is a small retention cyst in the lateral left maxillary antrum. There is mucosal thickening in an anterior superior ethmoid air cell on the left. There is a concha bullosa on the left, an anatomic variant. There is rightward deviation nasal septum. Visualized orbits appear symmetric bilaterally. Other: Visualized mastoid air cells are clear. IMPRESSION: Evidence of acute infarct involving a portion of the posterior mid to inferior right cerebellum as well as much of the left occipital lobe, particularly medially and mid portions. These infarcts involve the posterior circulation ; question embolic phenomenon given the bilateral  nature of these infarcts. There is an age uncertain infarct in the left genu and posterior limb internal capsule. There is patchy small vessel disease in the centra semiovale as well as a small prior appearing infarct in the lateral left cerebellum. No hemorrhage, mass, or extra-axial fluid collection. Areas of vascular calcification noted in the carotid siphon regions. Mild paranasal sinus disease noted. These results were called by telephone at the time of interpretation on 06/21/2016 at 2:04 pm to Dr. Doylene Bode, who verbally acknowledged these results. Electronically Signed   By: Bretta Bang III M.D.   On: 06/21/2016  14:04   Ct Angio Neck W Or Wo Contrast  Result Date: 06/21/2016 CLINICAL DATA:  60 year old male with altered mental status status post left lower extremity vascular surgery today. Possible new onset blindness. Noncontrast head CT at 1348 hours today suggesting acute left PCA infarct. Initial encounter. EXAM: CT ANGIOGRAPHY HEAD AND NECK TECHNIQUE: Multidetector CT imaging of the head and neck was performed using the standard protocol during bolus administration of intravenous contrast. Multiplanar CT image reconstructions and MIPs were obtained to evaluate the vascular anatomy. Carotid stenosis measurements (when applicable) are obtained utilizing NASCET criteria, using the distal internal carotid diameter as the denominator. CONTRAST:  50 mL Isovue 370 COMPARISON:  Head CT without contrast 1348 hours today. FINDINGS: CTA NECK Skeleton: Poor dentition. Chronic left lateral rib fractures. No acute osseous abnormality identified. Visualized paranasal sinuses and mastoids are stable and well pneumatized. Upper chest: Negative lung apices except for the possibility of emphysema. No superior mediastinal lymphadenopathy. Other neck: Negative thyroid, larynx, pharynx, parapharyngeal spaces, retropharyngeal space, sublingual space, submandibular glands and parotid glands. Visualized orbit soft tissues are within normal limits. Visualized scalp soft tissues are within normal limits. No cervical lymphadenopathy. Aortic arch: 3 vessel arch configuration. Little arch atherosclerosis, however there is great vessel origin atherosclerosis as detailed below. Right carotid system: No brachiocephalic artery or right CCA origin stenosis despite some calcified plaque. Negative right CCA proximal to the bifurcation. At the right carotid bifurcation there is bulky calcified and low-density plaque involving the right ICA origin and bulb. However, stenosis is less than 50 % with respect to the distal vessel. Negative cervical right  ICA otherwise. Left carotid system: Soft plaque with High-grade stenosis at the left CCA origin approaching a radiographic string sign, numerically estimated at 75 % with respect to the distal vessel. See series 5, image 174. The left CCA remains patent. At the left carotid bifurcation there is bulky soft and calcified plaque however, stenosis is less than 50 % with respect to the distal vessel. Negative cervical left ICA otherwise. Vertebral arteries:New line no proximal right subclavian artery stenosis. Right vertebral artery origin is normal. There is calcified right V4 segment plaque which is not hemodynamically significant. The right vertebral artery is patent to the skullbase without stenosis. No proximal left subclavian artery stenosis despite soft and calcified plaque. The left vertebral artery origin is normal. There is compression of the proximal left V2 segment in the transverse foramen related to lower cervical spine degeneration (series 5, image 127), but otherwise no left vertebral artery stenosis in the neck. Intermittent left V2 segment tortuosity. CTA HEAD Posterior circulation: Both distal vertebral arteries appear somewhat diminutive but are patent without stenosis. Patent vertebrobasilar junction. Both PICA origins and AICA origins appear to be patent. Patent but diminutive basilar artery. Mild distal basilar stenosis. SCA and right PCA origins are patent. There is a fetal type left PCA origin, but  the left P1 segment appears likely occluded. There is superimposed distal left P2 short-segment occlusion. See series 10, image 21. Despite these findings there is distal left PCA enhancement. There is associated paucity of cerebral enhancement in the left PCA territory (series 5, image 48). There is mild right PCA branch irregularity. Anterior circulation: Both ICA siphons are patent. Both supraclinoid ICAs are diminutive but patent. There is superimposed mild stenosis of the left supraclinoid ICA.  There is left greater than right anterior genu calcified plaque. The posterior communicating artery origins are normal. The carotid termini, MCA and ACA origins are patent. The anterior communicating artery is diminutive or absent. The bilateral PCA branches are patent. Left MCA M1 segment, bifurcation, and left MCA branches are patent without irregularity. Right MCA M1 segment, bifurcation, and right MCA branches are patent without irregularity. Venous sinuses: Patent. Anatomic variants: Possible fetal type left PCA origin. Review of the MIP images confirms the above findings IMPRESSION: 1. Positive for segmental occlusion of the Left PCA (P1 and distal P2 segments), but there is preserved distal PCA branch enhancement. 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 Preliminary report of these findings discussed by telephone with Dr. Caryl PinaERIC LINDZEN on 06/21/2016 at 16:22 . At 1538 hours. 2. No other circle of Willis branch or large vessel occlusion, but there is High-grade stenosis at the Left CCA origin (numerically estimated at 75% but approaching a Radiographic-String-Sign) related to combined calcified and low-density atherosclerotic plaque. 3. Generalized diminutive appearance of the intracranial circulation felt related to chronic vascular disease. Superimposed mild stenoses at the left ICA siphon, a distal basilar artery, and right PCA. 4. Soft and calcified atherosclerosis at both carotid bifurcations but not hemodynamically significant. Electronically Signed   By: Odessa FlemingH  Hall M.D.   On: 06/21/2016 16:23   Dg Ang/ext/uni/or Left  Result Date: 06/21/2016 CLINICAL DATA:  60 year old male with acute left lower extremity ischemia EXAM: LEFT ANG/EXT/UNI/ OR CONTRAST:  Op note FLUOROSCOPY TIME:  Op note COMPARISON:  None. FINDINGS: Two intraoperative fluoroscopic spot images of left lower extremity angiogram demonstrates partial opacification of left femoral distal bypass, with partial opacification of tibial vessels.  IMPRESSION: Intraoperative left lower extremity angiogram for acute leg ischemia, as above. Please refer to the dictated operative report for full details of intraoperative findings and procedure. Signed, Yvone NeuJaime S. Loreta AveWagner, DO [facility] Electronically Signed   By: Gilmer MorJaime  Wagner D.O.   On: 06/21/2016 12:58    Cardiac Studies   - Left ventricle: The cavity size was normal. Wall thickness was   increased in a pattern of moderate LVH. Systolic function was   normal. The estimated ejection fraction was in the range of 60%   to 65%. There is akinesis of the apicalanteroseptal and   inferolateral myocardium. Features are consistent with a   pseudonormal left ventricular filling pattern, with concomitant   abnormal relaxation and increased filling pressure (grade 2   diastolic dysfunction).  Patient Profile     60 year old with vasculopathic history that now includes left lower extremity ischemic necrosis, postoperative acute stroke with speech difficulty, and simultaneous development of anterior ST elevation myocardial infarction during surgical thrombectomy of left lower extremity. Evidence of failed improvement in left lower extremity blood flow. May need AKA.  Assessment & Plan    1. Acute anterior ST elevation infarction with significant enzyme elevation. ECG has not shown significant evolutionary changes suggesting the possibility of spontaneous reperfusion or collateral recruitment.TTE pending but appear to have viable anterior  wall.  Need to start therapy with beta blocker and consider ACE inhibitor therapy based upon blood pressure and kidney function. 2. Acute embolic stroke per neurology 3. Critical limb ischemia, left lower extremity with evidence of necrosis and failed attempts at revascularization. Will need amputation 4. Alcohol abuse and at risk for development of withdrawal.  Overall prognosis is very poor for any one of the  4 above problems. Plan medical management of coronary disease and clinical follow-up daily. Is at high risk for a high risk procedure.  He is on aspirin and would plan to continue that through his hospital stay and around the time of his operation.  Would also continue his beta blocker as this may help to control any HR response which would cause ischemia. Would prefer to wait as long as possible prior to surgery to allow for healing post STEMI. Fortunately, he has had a TTE that shows a normal EF and apical hypokinesis.   Signed, Will Jorja Loa, MD  06/23/2016, 11:47 AM

## 2016-06-23 NOTE — Evaluation (Signed)
Clinical/Bedside Swallow Evaluation Patient Details  Name: Stephen Bowen MRN: [id number] Date of Birth: 1955-07-30  Today's Date: 06/23/2016 Time: SLP Start Time (ACUTE ONLY): 0810 SLP Stop Time (ACUTE ONLY): 0838 SLP Time Calculation (min) (ACUTE ONLY): 28 min  Past Medical History:  Past Medical History:  Diagnosis Date  . HTN (hypertension)   . Noncompliance   . PAD (peripheral artery disease) (HCC)    a.  s/p left femoral to PT artery bypass with propaten on 02/16/16.   Past Surgical History:  Past Surgical History:  Procedure Laterality Date  . BYPASS GRAFT POPLITEAL TO TIBIAL Left 06/21/2016   Procedure: BYPASS GRAFT POPLITEAL TO TIBIAL USING GORE PROPATEN GRAFT;  Surgeon: Maeola HarmanBrandon Christopher Cain, MD;  [facility];  Service: Vascular;  Laterality: Left;  . EMBOLECTOMY Left 06/21/2016   Procedure: EMBOLECTOMY left lower extremity.;  Surgeon: Maeola HarmanBrandon Christopher Cain, MD;  [facility];  Service: Vascular;  Laterality: Left;  . ENDARTERECTOMY FEMORAL Left 02/16/2016   Procedure: LEFT FEMORAL ENDARTERECTOMY;  Surgeon: Maeola HarmanBrandon Christopher Cain, MD;  [facility];  Service: Vascular;  Laterality: Left;  . FEMORAL BYPASS  02/16/2016   LEFT FEMORAL-POSTERIOR TIBIAL ARTERY BYPASS  WITH PROPATEN 6 MM X 80 CM VASCULAR RING GRAFT (Left)  . FEMORAL-TIBIAL BYPASS GRAFT Left 02/16/2016   Procedure: LEFT FEMORAL-POSTERIOR TIBIAL ARTERY BYPASS  WITH PROPATEN 6 MM X 80 CM VASCULAR RING GRAFT;  Surgeon: Maeola HarmanBrandon Christopher Cain, MD;  [facility];  Service: Vascular;  Laterality: Left;  . PATCH ANGIOPLASTY Left 02/16/2016   Procedure: PATCH ANGIOPLASTY WITH Livia SnellenXENOSURE BIOLOGIC PATCH 1 CM X 6 CM;  Surgeon: Maeola HarmanBrandon Christopher Cain, MD;  [facility];  Service: Vascular;  Laterality: Left;  . PERIPHERAL VASCULAR CATHETERIZATION N/A 07/07/2015   Procedure: Abdominal Aortogram w/Lower Extremity;  Surgeon: Fransisco HertzBrian L Chen, MD;  [facility];  Service: Cardiovascular;   Laterality: N/A;  . PERIPHERAL VASCULAR CATHETERIZATION N/A 02/15/2016   Procedure: Abdominal Aortogram w/Lower Extremity;  Surgeon: Maeola HarmanBrandon Christopher Cain, MD;  [facility];  Service: Cardiovascular;  Laterality: N/A;  . PERIPHERAL VASCULAR CATHETERIZATION Bilateral 06/20/2016   Procedure: Lower Extremity Angiography;  Surgeon: Maeola HarmanBrandon Christopher Cain, MD;  [facility];  Service: Cardiovascular;  Laterality: Bilateral;  limited runoff on rt leg thrombolysis lt leg bypass graft  . 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   HPI:  Patient is a 60 y.o.malewho [facility] left ischemic foot secondary to occluded left femoral to posterior tibial bypass. S/p thromboembolectomy patient was having ST changes and cardiology was consulted. The patient on awakening from anesthesia was aphasic and IP Code Stroke was called. Patient was not administered IV t-PA. Head CT 06/21/16 showed evidence of acute infarct involving a portion of the posterior mid to inferior right cerebellum as well as much of the left occipital lobe, particularly medially and mid portions.    Assessment / Plan / Recommendation Clinical Impression  Pt demosntrates significantly impaired awareness of environment due to sensory loss following CVA. Pt is confused, on CIWA protocol, appears to have recpetive aphasia and possibly some level of visual impairment. Pt is awake but will drift off to sleep without stimuli due to medication. When tactile feedback given by placing cup in pts hand and dipping his fingers into the cup when saying "drink this water", pt verbalized, "why do i need to drink water?" Eventually pt did take  sips and drank 2 oz with small sips with no signs of aspiration or dysphagia. Could not encourage pt to take bites of puree with similar techniques. Suspect there is no impairment with pts ability to consume PO, other than  cognition. Recommend initiating a full liquid diet while pt is lethargic and advancing to solids as arousal improves and pt demonstrates ability to feed himself or respond appropriately to caregivers. Recommend CIR at d/c.     Aspiration Risk  Mild aspiration risk    Diet Recommendation Thin liquid   Liquid Administration via: Cup;Straw Medication Administration: Via alternative means (ok to try pills with water, but suspect pt will resist. ) Supervision: Staff to assist with self feeding;Full supervision/cueing for compensatory strategies Compensations: Minimize environmental distractions Postural Changes: Seated upright at 90 degrees;Remain upright for at least 30 minutes after po intake    Other  Recommendations Oral Care Recommendations: Oral care BID   Follow up Recommendations Inpatient Rehab      Frequency and Duration min 2x/week  2 weeks       Prognosis Prognosis for Safe Diet Advancement: Good Barriers to Reach Goals: Cognitive deficits;Language deficits      Swallow Study   General HPI: Patient is a 60 y.o.malewho [facility] of left ischemic foot secondary to occluded left femoral to posterior tibial bypass. S/p thromboembolectomy patient was having ST changes and cardiology was consulted. The patient on awakening from anesthesia was aphasic and IP Code Stroke was called. Patient was not administered IV t-PA. Head CT 06/21/16 showed evidence of acute infarct involving a portion of the posterior mid to inferior right cerebellum as well as much of the left occipital lobe, particularly medially and mid portions.  Type of Study: Bedside Swallow Evaluation Previous Swallow Assessment: none Diet Prior to this Study: NPO Temperature Spikes Noted: No Respiratory Status: Room air History of Recent Intubation: Yes Length of Intubations (days):  (for surgery) Date extubated: 06/21/16 Behavior/Cognition: Confused;Doesn't follow  directions;Lethargic/Drowsy Oral Cavity Assessment: Other (comment) (couldnot fully assess) Oral Care Completed by SLP: No Vision: Impaired for self-feeding Self-Feeding Abilities: Needs assist Patient Positioning: Upright in bed Baseline Vocal Quality: Normal Volitional Cough: Cognitively unable to elicit Volitional Swallow: Unable to elicit    Oral/Motor/Sensory Function Overall Oral Motor/Sensory Function: Within functional limits   Ice Chips Ice chips: Not tested   Thin Liquid Thin Liquid: Within functional limits Presentation: Cup;Self Fed    Nectar Thick Nectar Thick Liquid: Not tested   Honey Thick Honey Thick Liquid: Not tested   Puree Puree: Not tested Other Comments: pt refused   Solid   GO   Solid: Not tested        Dorothyann Mourer, Riley NearingBonnie Caroline 06/23/2016,8:44 AM

## 2016-06-23 NOTE — Progress Notes (Addendum)
STROKE TEAM PROGRESS NOTE   HISTORY OF PRESENT ILLNESS (per record) Stephen Bowen is an 60 y.o. male who presented to [facility] for management of left ischemic foot secondary to occluded left femoral to posterior tibial bypass. He underwent thromboembolectomy of the bypass graft and bypass from existing graft to distal PT artery with 6mm ringed ptfe with distal vein patch. Following the procedure a left lower extremity angiogram demonstrated runoff through the graft to the level of the ankle. During the procedure the patient was having ST changes and cardiology was consulted. The patient on awakening from anesthesia was aphasic and IP Code Stroke was called. Patient was LKW at 9:06 on 06/19/2016 at time of initiation of anesthesia. Patient was not administered IV t-PA.    SUBJECTIVE (INTERVAL HISTORY) His brother was briefly at the bedside prior to rounds.  We were not able to have a full discussion.  However, patient apparently recognized him by his nickname but had difficulty seeing him.  OBJECTIVE Temp:  [97.5 F (36.4 C)-99.3 F (37.4 C)] 97.5 F (36.4 C) (12/30 0335) Pulse Rate:  [74-130] 74 (12/30 0700) Cardiac Rhythm: Normal sinus rhythm (12/30 0700) Resp:  [0-22] 10 (12/30 0700) BP: (102-224)/(63-136) 118/70 (12/30 0700) SpO2:  [95 %-100 %] 99 % (12/30 0700)  CBC:   Recent Labs Lab 06/22/16 1634 06/23/16 0336  WBC 14.8* 11.4*  HGB 9.7* 8.1*  HCT 27.4* 22.4*  MCV 84.3 83.9  PLT 161 155    Basic Metabolic Panel:   Recent Labs Lab 06/22/16 0050 06/23/16 0336  NA 135 135  K 4.6 4.3  CL 102 103  CO2 24 26  GLUCOSE 127* 98  BUN 12 12  CREATININE 1.09 0.88  CALCIUM 8.0* 7.9*    Lipid Panel:     Component Value Date/Time   CHOL 77 06/22/2016 0050   TRIG 101 06/22/2016 0050   HDL 20 (L) 06/22/2016 0050   CHOLHDL 3.9 06/22/2016 0050   VLDL 20 06/22/2016 0050   LDLCALC 37 06/22/2016 0050   HgbA1c:  Lab Results  Component Value Date   HGBA1C 5.5 02/14/2016    Urine Drug Screen:     Component Value Date/Time   LABOPIA NONE DETECTED 02/13/2016 1538   COCAINSCRNUR NONE DETECTED 02/13/2016 1538   LABBENZ NONE DETECTED 02/13/2016 1538   AMPHETMU NONE DETECTED 02/13/2016 1538   THCU NONE DETECTED 02/13/2016 1538   LABBARB NONE DETECTED 02/13/2016 1538      IMAGING  Ct Head Wo Contrast 06/21/2016 Evidence of acute infarct involving a portion of the posterior mid to inferior right cerebellum as well as much of the left occipital lobe, particularly medially and mid portions. These infarcts involve the posterior circulation ; question embolic phenomenon given the bilateral nature of these infarcts.  There is an age uncertain infarct in the left genu and posterior limb internal capsule.  There is patchy small vessel disease in the centra semiovale as well as a small prior appearing infarct in the lateral left cerebellum.  No hemorrhage, mass, or extra-axial fluid collection.  Areas of vascular calcification noted in the carotid siphon regions.  Mild paranasal sinus disease noted.    Ct Head Wo Contrast 06/23/2016 pending   Ct Angio Head W Or Wo Contrast Ct Angio Neck W Or Wo Contrast 06/21/2016 1. Positive for segmental occlusion of the Left PCA (P1 and distal P2 segments), but there is preserved distal PCA branch enhancement. 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  2. No other circle of Willis  branch or large vessel occlusion, but there is High-grade stenosis at the Left CCA origin (numerically estimated at 75% but approaching a Radiographic-String-Sign) related to combined calcified and low-density atherosclerotic plaque.  3. Generalized diminutive appearance of the intracranial circulation felt related to chronic vascular disease. Superimposed mild stenoses at the left ICA siphon, a distal basilar artery, and right PCA.  4. Soft and calcified atherosclerosis at both carotid bifurcations but not hemodynamically significant.   PHYSICAL  EXAM Frail and malnourished-looking middle-age male currently not in distress but quite restless in bed. Continuously complaining "room is cold."  Will not follow commands or address the examiner HEENT:  NCAT, PERRL; sclera anicteric; mucous membranes moist Cardiovascular - Regular rate and rhythm Pulmonary: CTA Abdomen: NT, ND, decreased bowel sounds; abdomen is firm Extremities: Left lower extremity frankly ischemic with fluid filled bulla, per Vascular, clearly non-viable  Neurological Exam Mental Status: Confused and follows no commands.  Resists examintion Orientation:  Unable to test Speech:  Unable to fully test; he does produce occasional fluent sentences but does not attempt to hold conversation  Cranial Nerves:  Right pupil is 4 mm and briskly reactive and left is 3 mm and sluggishly reactive; EOMI; visual fields grossly limited to threat.  Face grosslysymmetric, hearing grossly intact; unable to test shrug symmetry or tongue movement due to encephalopathy  Motor system  able to move both upper extremities symmetrically against gravity with grossly normal strength. Left lower extremity moments extremely painful even to touch from below the knee. Right lower extremity is moved by patient spontaneously  ASSESSMENT/PLAN Stephen Bowen is a 60 y.o. male with history of poorly controlled HTN, HLD admitted with L ischemic foot s/p thromboembolectomy with bypass accompanied by ST changes who developed aphasia post surgery. He did not receive IV t-PA due to recent surgery.   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 High grade stenosis L CCA origin ~75%  Carotid Doppler  R ICA 40-59%, L ICA 60-79%  Repeat CT head - pending  2D Echo - pending   LDL 37  HgbA1c 5.5  in Aug  IV heparin for VTE prophylaxis Diet NPO time specified  aspirin 325 mg daily and warfarin daily prior to admission, now on aspirin 325 mg daily and heparin IV  Patient counseled to be compliant with his antithrombotic medications  Ongoing aggressive stroke risk factor management  Therapy recommendations:  pending   Disposition:  pending   Hypertension/Hypertensive Urgency  BP as high as 224-116, 208/121  Unstable  Permissive hypertension (OK if < 220/120) but gradually normalize in 5-7 days  Long-term BP goal normotensive  Hyperlipidemia  Home meds:  lipitor 40, resumed in hospital  LDL 37, goal < 70  Continue statin at discharge  Other Stroke Risk Factors  Cigarette smoker, advised to stop smoking  ETOH use, at least 40oz day, advised to drink no more than 2 drink(s) a day  THC use - UDS positive on admission  Family hx stroke (sister)  Coronary artery disease -  acute MI, on IV heparin  PAD - LLE ischemia s/p left lower extremity bypass complicated by stroke and MI this admission  ATTENDING NOTE: Patient was seen and examined by me personally. Documentation reflects findings. The laboratory and radiographic studies reviewed by me.  ROS: pertinent positives could not be fully documented due to LOC  Condition: unchanged  Assessment and plan completed by me personally and fully documented above. Plans/Recommendations include:     Awaiting completion of stroke work-up, including repeat CT today  Will follow  SIGNED BY: Dr. Alesia Bandahere Ruger Saxer   Hospital day # 4  AFTERNOON ADDENDUM:  CT this afternoon demonstrates: 1. Evolving left greater than right bilateral PCA territory infarcts with a superimposed posterior right cerebellar (AICA) infarct, with cytotoxic edema but no associated hemorrhage. 2. Superimposed more acute appearing cytotoxic edema in the posterior left temporal lobe. This could reflect a more a acute extension of the left PCA infarct,  or superimposed infarct in the posterior left MCA territory. Brain MRI and intracranial MRA without contrast would best evaluate further. 3. Mild regional mass effect in the posterior left hemisphere. Basilar cisterns remain patent and there is no midline shift or ventriculomegaly. 4. 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  Will order MRI to better define vascular burden in the posterior fossa that may not be well identified on the CT scan.  With the volume of ischemia more clearly identified on this CT scan, patient is at risk for hemorrhagic transformation of ischemic stroke  To contact Stroke Continuity provider, please refer to [email or web address]. After hours, contact General Neurology

## 2016-06-24 DIAGNOSIS — H47611 Cortical blindness, right side of brain: Secondary | ICD-10-CM

## 2016-06-24 DIAGNOSIS — H47612 Cortical blindness, left side of brain: Secondary | ICD-10-CM

## 2016-06-24 LAB — CBC
HCT: 31.6 % — ABNORMAL LOW (ref 39.0–52.0)
HEMATOCRIT: 19.8 % — AB (ref 39.0–52.0)
HEMOGLOBIN: 7 g/dL — AB (ref 13.0–17.0)
Hemoglobin: 10.9 g/dL — ABNORMAL LOW (ref 13.0–17.0)
MCH: 30.2 pg (ref 26.0–34.0)
MCH: 29.5 pg (ref 26.0–34.0)
MCHC: 35.4 g/dL (ref 30.0–36.0)
MCHC: 34.5 g/dL (ref 30.0–36.0)
MCV: 85.3 fL (ref 78.0–100.0)
MCV: 85.6 fL (ref 78.0–100.0)
PLATELETS: 234 10*3/uL (ref 150–400)
Platelets: 224 10*3/uL (ref 150–400)
RBC: 2.32 MIL/uL — AB (ref 4.22–5.81)
RBC: 3.69 MIL/uL — AB (ref 4.22–5.81)
RDW: 13.8 % (ref 11.5–15.5)
RDW: 13.6 % (ref 11.5–15.5)
WBC: 11.8 10*3/uL — ABNORMAL HIGH (ref 4.0–10.5)
WBC: 14 10*3/uL — ABNORMAL HIGH (ref 4.0–10.5)

## 2016-06-24 LAB — VAS US CAROTID
LCCADSYS: 109 cm/s
LEFT ECA DIAS: -23 cm/s
LEFT VERTEBRAL DIAS: 23 cm/s
Left CCA dist dias: 36 cm/s
Left CCA prox dias: 30 cm/s
Left CCA prox sys: 108 cm/s
Left ICA dist dias: -51 cm/s
Left ICA dist sys: -132 cm/s
Left ICA prox dias: 66 cm/s
Left ICA prox sys: 146 cm/s
RCCADSYS: -169 cm/s
RCCAPDIAS: 21 cm/s
RCCAPSYS: 98 cm/s
RIGHT ECA DIAS: -20 cm/s
RIGHT VERTEBRAL DIAS: 32 cm/s

## 2016-06-24 LAB — BASIC METABOLIC PANEL
Anion gap: 7 (ref 5–15)
BUN: 10 mg/dL (ref 6–20)
CHLORIDE: 100 mmol/L — AB (ref 101–111)
CO2: 25 mmol/L (ref 22–32)
CREATININE: 0.86 mg/dL (ref 0.61–1.24)
Calcium: 8 mg/dL — ABNORMAL LOW (ref 8.9–10.3)
GFR calc non Af Amer: >60 mL/min (ref >60)
Glucose, Bld: 80 mg/dL (ref 65–99)
Potassium: 3.9 mmol/L (ref 3.5–5.1)
Sodium: 132 mmol/L — ABNORMAL LOW (ref 135–145)

## 2016-06-24 LAB — HEPARIN LEVEL (UNFRACTIONATED): HEPARIN UNFRACTIONATED: 0.37 [IU]/mL (ref 0.30–0.70)

## 2016-06-24 LAB — PREPARE RBC (CROSSMATCH)

## 2016-06-24 LAB — CK
CK TOTAL: 5070 U/L — AB (ref 49–397)
CK TOTAL: 5753 U/L — AB (ref 49–397)
CK TOTAL: 5740 U/L — AB (ref 49–397)
Total CK: 5001 U/L — ABNORMAL HIGH (ref 49–397)

## 2016-06-24 MED ORDER — SODIUM CHLORIDE 0.9 % IV SOLN
Freq: Once | INTRAVENOUS | Status: AC
  Administered 2016-06-24: 10 mL/h via INTRAVENOUS

## 2016-06-24 MED ORDER — DEXMEDETOMIDINE HCL IN NACL 200 MCG/50ML IV SOLN
0.2000 ug/kg/h | INTRAVENOUS | Status: AC
  Administered 2016-06-24: 0.7 ug/kg/h via INTRAVENOUS
  Administered 2016-06-25 (×2): 0.5 ug/kg/h via INTRAVENOUS
  Administered 2016-06-25: 0.3 ug/kg/h via INTRAVENOUS
  Filled 2016-06-24 (×5): qty 50

## 2016-06-24 NOTE — Clinical Social Work Note (Signed)
CSW received consult that patient needs assistance obtaining medication, case manager can assist with this please consult case managerment, CSW to sign off.  Ervin KnackEric R. Van Seymore, MSW, Theresia MajorsLCSWA [phone]  Mon-Fri 8a-4:30p 06/24/2016 9:41 AM

## 2016-06-24 NOTE — Progress Notes (Signed)
STROKE TEAM PROGRESS NOTE   HISTORY OF PRESENT ILLNESS (per record) Stephen ApleyJerome D Knee is an 60 y.o. male who [facility] management of left ischemic foot secondary to occluded left femoral to posterior tibial bypass. He underwent thromboembolectomy of the bypass graft and bypass from existing graft to distal PT artery with 6mm ringed ptfe with distal vein patch. Following the procedure a left lower extremity angiogram demonstrated runoff through the graft to the level of the ankle. During the procedure the patient was having ST changes and cardiology was consulted. The patient on awakening from anesthesia was aphasic and IP Code Stroke was called. Patient was LKW at 9:06 on 06/19/2016 at time of initiation of anesthesia. Patient was not administered IV t-PA.    SUBJECTIVE (INTERVAL HISTORY) His family was not at the bedside this AM.  His RN was at the bedside and reported that patient is undergoing two unit PRBC transfusion and is scheduled to go to the OR this AM.   No significant events overnight otherwise  OBJECTIVE Temp:  [97.6 F (36.4 C)-100.2 F (37.9 C)] 98.7 F (37.1 C) (12/31 0900) Pulse Rate:  [66-124] 66 (12/31 0900) Cardiac Rhythm: Sinus tachycardia;Other (Comment) (12/31 0800) Resp:  [11-33] 13 (12/31 0900) BP: (116-195)/(64-108) 130/71 (12/31 0900) SpO2:  [97 %-100 %] 100 % (12/31 0900)  CBC:   Recent Labs Lab 06/23/16 0336 06/24/16 0219  WBC 11.4* 11.8*  HGB 8.1* 7.0*  HCT 22.4* 19.8*  MCV 83.9 85.3  PLT 155 224    Basic Metabolic Panel:   Recent Labs Lab 06/22/16 0050 06/23/16 0336  NA 135 135  K 4.6 4.3  CL 102 103  CO2 24 26  GLUCOSE 127* 98  BUN 12 12  CREATININE 1.09 0.88  CALCIUM 8.0* 7.9*    Lipid Panel:     Component Value Date/Time   CHOL 77 06/22/2016 0050   TRIG 101 06/22/2016 0050   HDL 20 (L) 06/22/2016 0050   CHOLHDL 3.9 06/22/2016 0050   VLDL 20 06/22/2016 0050   LDLCALC 37 06/22/2016 0050   HgbA1c:  Lab Results   Component Value Date   HGBA1C 5.5 02/14/2016   Urine Drug Screen:     Component Value Date/Time   LABOPIA NONE DETECTED 02/13/2016 1538   COCAINSCRNUR NONE DETECTED 02/13/2016 1538   LABBENZ NONE DETECTED 02/13/2016 1538   AMPHETMU NONE DETECTED 02/13/2016 1538   THCU NONE DETECTED 02/13/2016 1538   LABBARB NONE DETECTED 02/13/2016 1538      IMAGING  Ct Head Wo Contrast 06/21/2016 Evidence of acute infarct involving a portion of the posterior mid to inferior right cerebellum as well as much of the left occipital lobe, particularly medially and mid portions. These infarcts involve the posterior circulation ; question embolic phenomenon given the bilateral nature of these infarcts.  There is an age uncertain infarct in the left genu and posterior limb internal capsule.  There is patchy small vessel disease in the centra semiovale as well as a small prior appearing infarct in the lateral left cerebellum.  No hemorrhage, mass, or extra-axial fluid collection.  Areas of vascular calcification noted in the carotid siphon regions.  Mild paranasal sinus disease noted.   Ct Head Wo Contrast 06/23/2016 1. Evolving left greater than right bilateral PCA territory infarcts with a superimposed posterior right cerebellar (AICA) infarct, with cytotoxic edema but no associated hemorrhage. 2. Superimposed more acute appearing cytotoxic edema in the posterior left temporal lobe. This could reflect a more a acute  extension of the left PCA infarct, or superimposed infarct in the posterior left MCA territory. Brain MRI and intracranial MRA without contrast would best evaluate further. 3. Mild regional mass effect in the posterior left hemisphere. Basilar cisterns remain patent and there is no midline shift or ventriculomegaly. 4. 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  MRI Brain Wo Contrast - pending  Ct Angio Head W  Or Wo Contrast Ct Angio Neck W Or Wo Contrast 06/21/2016 1. Positive for segmental occlusion of the Left PCA (P1 and distal P2 segments), but there is preserved distal PCA branch enhancement. 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  2. No other circle of Willis branch or large vessel occlusion, but there is High-grade stenosis at the Left CCA origin (numerically estimated at 75% but approaching a Radiographic-String-Sign) related to combined calcified and low-density atherosclerotic plaque.  3. Generalized diminutive appearance of the intracranial circulation felt related to chronic vascular disease. Superimposed mild stenoses at the left ICA siphon, a distal basilar artery, and right PCA.  4. Soft and calcified atherosclerosis at both carotid bifurcations but not hemodynamically significant.    2D Echocardiogram 06/23/2016 Study Conclusions - Left ventricle: The cavity size was normal. Wall thickness was   increased in a pattern of moderate LVH. Systolic function was   normal. The estimated ejection fraction was in the range of 60%   to 65%. There is akinesis of the apicalanteroseptal and   inferolateral myocardium. Features are consistent with a   pseudonormal left ventricular filling pattern, with concomitant   abnormal relaxation and increased filling pressure (grade 2   diastolic dysfunction). Impressions- - Since last echo, apical wall motion abnormality is new.  PHYSICAL EXAM Frail and malnourished-looking middle-age male currently not in distress but quite restless in bed. Continuously complaining "room is cold."  Will not follow commands or address the examiner HEENT:  NCAT, PERRL; sclera anicteric; mucous membranes moist Cardiovascular - Regular rate and rhythm Pulmonary: CTA Abdomen: NT, ND, decreased bowel sounds; abdomen is firm Extremities: Left lower extremity frankly ischemic with fluid filled bulla, per Vascular, clearly non-viable  Neurological Exam Mental  Status: Confused and follows no commands.  Resists examintion Orientation:  Unable to test Speech:  Unable to fully test; he does produce occasional fluent sentences but does not attempt to hold conversation  Cranial Nerves:  Right pupil is 4 mm and briskly reactive and left is 3 mm and sluggishly reactive; EOMI; visual fields grossly limited to threat.  Face grosslysymmetric, hearing grossly intact; unable to test shrug symmetry or tongue movement due to encephalopathy  Motor system  able to move both upper extremities symmetrically against gravity with grossly normal strength. Left lower extremity moments extremely painful even to touch from below the knee. Right lower extremity is moved by patient spontaneously  ASSESSMENT/PLAN Mr. Stephen Bowen is a 60 y.o. male with history of poorly controlled HTN, HLD admitted with L ischemic foot s/p thromboembolectomy with bypass accompanied by ST changes who developed aphasia post surgery. He did not receive IV t-PA due to recent surgery.   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 High grade stenosis L CCA origin ~75%  Carotid Doppler  R ICA 40-59%, L ICA 60-79%  Repeat CT head - see above - MR recommended by radiology.  MRI Brain Wo Contrast - pending; agree that supportive therapy and operative management of ischemic limb take priority for MRI.  In addition, patient may have a rod in his right leg (per RN discussion with brother.)  2D Echo - EF 60 - 65%. No cardiac source of emboli identified.  LDL 37  HgbA1c 5.5 in Aug  IV heparin for VTE prophylaxis Diet full liquid Room service appropriate? Yes; Fluid consistency: Thin  aspirin 325 mg daily and warfarin daily prior to admission, now on aspirin 325 mg daily and heparin  IV  Patient counseled to be compliant with his antithrombotic medications  Ongoing aggressive stroke risk factor management  Therapy recommendations:  pending   Disposition:  pending   Hypertension/Hypertensive Urgency  BP as high as 224-116, 208/121  Unstable  Permissive hypertension (OK if < 220/120) but gradually normalize in 5-7 days  Long-term BP goal normotensive  Hyperlipidemia  Home meds:  lipitor 40, resumed in hospital  LDL 37, goal < 70  Continue statin at discharge  Other Stroke Risk Factors  Cigarette smoker, advised to stop smoking  ETOH use, at least 40oz day, advised to drink no more than 2 drink(s) a day  THC use - UDS positive on admission  Family hx stroke (sister)  Coronary artery disease -  acute MI, on IV heparin  PAD - LLE ischemia s/p left lower extremity bypass complicated by stroke and MI this admission  ATTENDING NOTE: Patient was seen and examined by me personally. Documentation reflects findings. The laboratory and radiographic studies reviewed by me. ROS: pertinent positives could not be fully documented due to LOC  Condition: unchanged  Assessment and plan: completed by me personally Plans/Recommendations include:     Stroke work-up completed with the exception of MRI.  MRI may not be possible given the information provided by nursing that patient may have a rod in his non-ischemic leg.  In addition, agree that supportive therapy and operative management of ischemic limb should take priority over additional radiographic studies of the head for prognostication at this time. Therefore, will discontinue the order for the MRI.  Postoperatively, when medically able to undergo evaluation for disposition, patient will need to be assessed for rehab.  His rehabilitation capacity will likely be greatly limited by the amputation and the cortical blindness.  No further recommendations at this time.  Given the cerebral ischemic burden, the  current management with medically required anticoagulation does place patient at higher risk for hemorrhagic conversion.  Should patient have a change in neurologic function, please obtain CT of head and feel free to re-consult Neurology  Hospital day # 5  To contact Stroke Continuity provider, please refer to [email or web address]. After hours, contact General Neurology

## 2016-06-24 NOTE — Progress Notes (Signed)
Patient Name: Stephen Bowen Date of Encounter: 06/24/2016  Primary Cardiologist: C. 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 Received morphine and dilaudid overnight and sleeping this AM.  Inpatient Medications    Scheduled Meds: . aspirin  325 mg Oral Daily  . atorvastatin  40 mg Oral q1800  . mouth rinse  15 mL Mouth Rinse BID  . metoprolol tartrate  25 mg Oral BID  . pantoprazole  40 mg Oral Daily  . sodium chloride flush  3 mL Intravenous Q12H   Continuous Infusions: . sodium chloride 50 mL/hr (06/24/16 0858)  . dexmedetomidine 0.7 mcg/kg/hr (06/24/16 0800)  . heparin 800 Units/hr (06/24/16 0800)   PRN Meds: sodium chloride, acetaminophen **OR** acetaminophen, alum & mag hydroxide-simeth, guaiFENesin-dextromethorphan, hydrALAZINE, HYDROmorphone (DILAUDID) injection, metoprolol, morphine injection, ondansetron, oxyCODONE-acetaminophen, phenol, sodium chloride flush   Vital Signs    Vitals:   06/24/16 0900 06/24/16 1000 06/24/16 1100 06/24/16 1115  BP: 130/71 (!) 175/88 (!) 174/81 (!) 171/74  Pulse: 66 72 64   Resp: 13 15 (!) 0   Temp: 98.7 F (37.1 C)   98.7 F (37.1 C)  TempSrc: Axillary   Axillary  SpO2: 100% 100% 100%   Weight:      Height:        Intake/Output Summary (Last 24 hours) at 06/24/16 1150 Last data filed at 06/24/16 1145  Gross per 24 hour  Intake             1907 ml  Output             1385 ml  Net              522 ml   Filed Weights   06/20/16 1027  Weight: 122 lb 5.7 oz (55.5 kg)    Physical Exam   Malnourished-appearing 60 year old gentleman  appearing somewhat confused. GEN: Chronically ill and in no acute distress.  HEENT: Grossly normal.  Neck: Supple, with moderate JVD. No obvious carotid bruit is heard. Cardiac: RRR, no murmurs, rubs. No clubbing, cyanosis, edema.  No S4 heard but patient talking during exam Extremities: Left lower extremity is purple and cold. No palpable pulse Respiratory:  Respirations regular and unlabored, clear to auscultation bilaterally. GI: Soft, nontender, nondistended, BS + x 4. MS: no deformity or atrophy. Skin: warm and dry, no rash. Neuro:  Strength decreased in left leg Psych: AAOx0.  Detached affect.  Labs    CBC  Recent Labs  06/23/16 0336 06/24/16 0219  WBC 11.4* 11.8*  HGB 8.1* 7.0*  HCT 22.4* 19.8*  MCV 83.9 85.3  PLT 155 224   Basic Metabolic Panel  Recent Labs  06/23/16 0336 06/24/16 0833  NA 135 132*  K 4.3 3.9  CL 103 100*  CO2 26 25  GLUCOSE 98 80  BUN 12 10  CREATININE 0.88 0.86  CALCIUM 7.9* 8.0*   Liver Function Tests No results for input(s): AST, ALT, ALKPHOS, BILITOT, PROT, ALBUMIN in the last 72 hours. No results for input(s): LIPASE, AMYLASE in the last 72  hours. Cardiac Enzymes  Recent Labs  06/22/16 0927 06/22/16 1634 06/22/16 2048  06/23/16 2108 06/24/16 0219 06/24/16 0833  CKTOTAL 6,360* 9,828* 7,681*  < > 6,214* 5,070* 5,753*  TROPONINI 26.50* 20.73* 19.35*  --   --   --   --   < > = values in this interval not displayed. BNP Invalid input(s): POCBNP D-Dimer No results for input(s): DDIMER in the last 72 hours. Hemoglobin A1C No results for input(s): HGBA1C in the last 72 hours. Fasting Lipid Panel  Recent Labs  06/22/16 0050  CHOL 77  HDL 20*  LDLCALC 37  TRIG 161101  CHOLHDL 3.9   Thyroid Function Tests No results for input(s): TSH, T4TOTAL, T3FREE, THYROIDAB in the last 72 hours.  Invalid input(s): FREET3  Telemetry    Sinus tachycardia without significant ventricular ectopy. - Personally Reviewed  ECG    Left  ventricular hypertrophy. Sinus rhythm. Resolution of anterior ST elevation. QS pattern V1 and V2.- Personally Reviewed  Radiology    Ct Head Wo Contrast  Addendum Date: 06/23/2016   ADDENDUM REPORT: 06/23/2016 12:58 ADDENDUM: Study discussed by telephone with PA D. Rinehuls on 06/23/2016 at 1254 hours. Electronically Signed   By: Odessa FlemingH  Hall M.D.   On: 06/23/2016 12:58   Result Date: 06/23/2016 CLINICAL DATA:  60 year old male with left PCA occlusion and suspected acute left PCA infarct on 06/21/2016. Initial encounter. EXAM: CT HEAD WITHOUT CONTRAST TECHNIQUE: Contiguous axial images were obtained from the base of the skull through the vertex without intravenous contrast. COMPARISON:  CTA head and neck and noncontrast head CT 06/21/2016. FINDINGS: Brain: Cytotoxic edema throughout the left parietal and occipital lobes. Furthermore, there is more acute appearing loss of gray-white matter differentiation in the posterior left temporal lobe and possibly involving the posterior left frontal lobe. See the gradation of abnormal density demonstrated on series 21, image 13). Mild regional mass effect including on the left occipital horn and atrium of the left lateral ventricle. Stable hypodensity at the genu of the left internal capsule which appears related to prior small vessel ischemia. There is also confluent cytotoxic edema in the right occipital pole. There is also a small area of cytotoxic edema in the posterior right cerebellum (located just superior to the area which was questioned on 06/21/2016). No posterior fossa mass effect. No associated acute intracranial hemorrhage. No ventriculomegaly. No midline shift. Basilar cisterns are patent. No definite ACA or right MCA territory edema. Vascular: Calcified atherosclerosis at the skull base. No suspicious intracranial vascular hyperdensity. Skull: No acute osseous abnormality identified. Sinuses/Orbits: Chronic right lamina papyracea fracture. Visualized  paranasal sinuses and mastoids are stable and well pneumatized. Other: No acute orbit or scalp soft tissue finding. IMPRESSION: 1. Evolving left greater than right bilateral PCA territory infarcts with a superimposed posterior right cerebellar (AICA) infarct, with cytotoxic edema but no associated hemorrhage. 2. Superimposed more acute appearing cytotoxic edema in the posterior left temporal lobe. This could reflect a more a acute extension of the left PCA infarct, or superimposed infarct in the posterior left MCA territory. Brain MRI and intracranial MRA without contrast would best evaluate further. 3. Mild regional mass effect in the posterior left hemisphere. Basilar cisterns remain patent and there is no midline shift or ventriculomegaly. 4. 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 Electronically Signed: By: Odessa FlemingH  Hall M.D. On: 06/23/2016 12:49    Cardiac Studies   - Left ventricle: The cavity size was  normal. Wall thickness was   increased in a pattern of moderate LVH. Systolic function was   normal. The estimated ejection fraction was in the range of 60%   to 65%. There is akinesis of the apicalanteroseptal and   inferolateral myocardium. Features are consistent with a   pseudonormal left ventricular filling pattern, with concomitant   abnormal relaxation and increased filling pressure (grade 2   diastolic dysfunction).  Patient Profile     60 year old with vasculopathic history that now includes left lower extremity ischemic necrosis, postoperative acute stroke with speech difficulty, and simultaneous development of anterior ST elevation myocardial infarction during surgical thrombectomy of left lower extremity. Evidence of failed improvement in left lower extremity blood flow. May need AKA.  Assessment & Plan    1. Acute anterior ST elevation infarction with significant enzyme elevation. ECG has not shown  significant evolutionary changes suggesting the possibility of spontaneous reperfusion or collateral recruitment.TTE pending but appear to have viable anterior wall.  Need to start therapy with beta blocker and consider ACE inhibitor therapy based upon blood pressure and kidney function. EF normal with hypokinesis of the apex on TTE. Continue current management.  Allowing for permissive hypertension per neurology 2. Acute embolic stroke per neurology 3. Critical limb ischemia, left lower extremity with evidence of necrosis and failed attempts at revascularization. Jissell Trafton need amputation 4. Alcohol abuse and at risk for development of withdrawal. 5. Hypertension: permissive per neurology  Overall prognosis is very poor for any one of the 4 above problems. Plan medical management of coronary disease and clinical follow-up daily. Is at high risk for a high risk procedure.  He is on aspirin and would plan to continue that through his hospital stay and around the time of his operation.  Would also continue his beta blocker as this may help to control any HR response which would cause ischemia. Would prefer to wait as long as possible prior to surgery to allow for healing post STEMI. His EF is normal on his TTE and thus did not have excessive damage from his STEMI.   Signed, Paymon Rosensteel Jorja LoaMartin Guynell Kleiber, MD  06/24/2016, 11:50 AM

## 2016-06-24 NOTE — Progress Notes (Signed)
ANTICOAGULATION CONSULT NOTE - Follow Up Consult  Pharmacy Consult for Heparin Indication: S/P thrombectomy, new stroke  No Known Allergies  Patient Measurements: Height: 5\' 8"  (172.7 cm) Weight: 122 lb 5.7 oz (55.5 kg) IBW/kg (Calculated) : 68.4 Heparin Dosing Weight: 55  Vital Signs: Temp: 98.7 F (37.1 C) (12/31 1115) Temp Source: Axillary (12/31 1115) BP: 171/74 (12/31 1115) Pulse Rate: 64 (12/31 1100)  Labs:  Recent Labs  06/22/16 0050  06/22/16 0927 06/22/16 1634 06/22/16 2048 06/23/16 0336  06/23/16 2108 06/24/16 0219 06/24/16 0833  HGB 8.4*  --   --  9.7*  --  8.1*  --   --  7.0*  --   HCT 24.3*  --   --  27.4*  --  22.4*  --   --  19.8*  --   PLT 172  --   --  161  --  155  --   --  224  --   HEPARINUNFRC  --   < >  --  0.35  --  0.42  --   --  0.37  --   CREATININE 1.09  --   --   --   --  0.88  --   --   --  0.86  CKTOTAL  --   < > 6,360* 9,828* 7,681* 6,240*  < > 6,214* 5,070* 5,753*  TROPONINI >65.00*  --  26.50* 20.73* 19.35*  --   --   --   --   --   < > = values in this interval not displayed.  Estimated Creatinine Clearance: 71.7 mL/min (by C-G formula based on SCr of 0.86 mg/dL).   Medications:  Scheduled:  . aspirin  325 mg Oral Daily  . atorvastatin  40 mg Oral q1800  . mouth rinse  15 mL Mouth Rinse BID  . metoprolol tartrate  25 mg Oral BID  . pantoprazole  40 mg Oral Daily  . sodium chloride flush  3 mL Intravenous Q12H    Assessment: 60yo male on heparin for arterial occlusion.  Now s/p cath directed alteplase 12/27 & thrombectomy of L-femoral to PT BPG and Intraoperatively he developed ST elevation with post-op AMS.  Code STEMI initially called then cancelled( but will need cath) and Code Stroke called- CT(+)evolving embolic stroke and s/p thromboembolectomy of left femoral to PT bypass graft on 12/28.  Per notes at risk for hemorrhagic transformation and per vascular: will need a left AKA (noted plans for 06/25/16) -Heparin level=  0.37  Goal of Therapy:  Heparin level 0.3-0.5 units/ml,  Monitor platelets by anticoagulation protocol: Yes   Plan:  Continue heparin at 800 units/hr Daily heparin level, CBC Will follow procedural plans  Harland Germanndrew Reniya Mcclees, Pharm D 06/24/2016 12:17 PM

## 2016-06-24 NOTE — Progress Notes (Addendum)
    Subjective  -   Screaming out over night and given sedation   Physical Exam:  Resting comfortably this am No changes in non-viable left leg       Assessment/Plan:    Discussed with cardiology yesterday regarding timing of left leg AKA, and will try to delay surgery as long as possible to give his heart time to heal Creatinine remains stable Slight elevation in CK this am. Will plan for left leg AKA tomorrow as his Hb is down today and he is to receive blood this am. Will discuss with family when they arrive today.  He is at high risk for additional cardiac complication during surgery given his recent MI Appreciate neurology input.  No additional stroke workup currently    Afsa Meany, Wells 06/24/2016 10:40 AM --  Vitals:   06/24/16 0845 06/24/16 0900  BP: 131/74 130/71  Pulse:  66  Resp:  13  Temp: 98.7 F (37.1 C) 98.7 F (37.1 C)    Intake/Output Summary (Last 24 hours) at 06/24/16 1040 Last data filed at 06/24/16 0845  Gross per 24 hour  Intake           1639.4 ml  Output             1385 ml  Net            254.4 ml     Laboratory CBC    Component Value Date/Time   WBC 11.8 (H) 06/24/2016 0219   HGB 7.0 (L) 06/24/2016 0219   HCT 19.8 (L) 06/24/2016 0219   PLT 224 06/24/2016 0219    BMET    Component Value Date/Time   NA 132 (L) 06/24/2016 0833   K 3.9 06/24/2016 0833   CL 100 (L) 06/24/2016 0833   CO2 25 06/24/2016 0833   GLUCOSE 80 06/24/2016 0833   BUN 10 06/24/2016 0833   CREATININE 0.86 06/24/2016 0833   CREATININE 0.92 11/13/2013 1031   CALCIUM 8.0 (L) 06/24/2016 0833   GFRNONAA >60 06/24/2016 0833   GFRNONAA >89 11/13/2013 1031   GFRAA >60 06/24/2016 0833   GFRAA >89 11/13/2013 1031    COAG Lab Results  Component Value Date   INR 0.97 06/19/2016   INR 0.92 06/19/2016   INR 1.63 02/19/2016   No results found for: PTT  Antibiotics Anti-infectives    Start     Dose/Rate Route Frequency Ordered Stop   06/20/16 1730   ceFAZolin (ANCEF) IVPB 2g/100 mL premix     2 g 200 mL/hr over 30 Minutes Intravenous Every 8 hours 06/20/16 1621 06/20/16 1814       V. Charlena CrossWells Cedrik Heindl IV, M.D. [facility] [place] Office: [phone] Pager:  [phone]

## 2016-06-25 ENCOUNTER — Inpatient Hospital Stay (HOSPITAL_COMMUNITY): Payer: Medicaid Other | Admitting: Anesthesiology

## 2016-06-25 ENCOUNTER — Encounter (HOSPITAL_COMMUNITY): Payer: Self-pay | Admitting: Anesthesiology

## 2016-06-25 ENCOUNTER — Encounter (HOSPITAL_COMMUNITY)
Admission: EM | Disposition: A | Discharge status: Skilled Nursing Facility | Payer: Self-pay | Source: Home / Self Care | Attending: Surgery

## 2016-06-25 DIAGNOSIS — T82898A Other specified complication of vascular prosthetic devices, implants and grafts, initial encounter: Secondary | ICD-10-CM

## 2016-06-25 DIAGNOSIS — R4701 Aphasia: Secondary | ICD-10-CM

## 2016-06-25 DIAGNOSIS — I998 Other disorder of circulatory system: Secondary | ICD-10-CM

## 2016-06-25 HISTORY — PX: AMPUTATION: SHX166

## 2016-06-25 LAB — CBC
HEMATOCRIT: 29.4 % — AB (ref 39.0–52.0)
HEMATOCRIT: 35.1 % — AB (ref 39.0–52.0)
Hemoglobin: 9.9 g/dL — ABNORMAL LOW (ref 13.0–17.0)
Hemoglobin: 11.7 g/dL — ABNORMAL LOW (ref 13.0–17.0)
MCH: 29.6 pg (ref 26.0–34.0)
MCH: 29.5 pg (ref 26.0–34.0)
MCHC: 33.7 g/dL (ref 30.0–36.0)
MCHC: 33.3 g/dL (ref 30.0–36.0)
MCV: 88 fL (ref 78.0–100.0)
MCV: 88.4 fL (ref 78.0–100.0)
PLATELETS: 333 10*3/uL (ref 150–400)
Platelets: 292 10*3/uL (ref 150–400)
RBC: 3.34 MIL/uL — ABNORMAL LOW (ref 4.22–5.81)
RBC: 3.97 MIL/uL — ABNORMAL LOW (ref 4.22–5.81)
RDW: 14.5 % (ref 11.5–15.5)
RDW: 14.8 % (ref 11.5–15.5)
WBC: 13.5 10*3/uL — ABNORMAL HIGH (ref 4.0–10.5)
WBC: 14.4 10*3/uL — ABNORMAL HIGH (ref 4.0–10.5)

## 2016-06-25 LAB — BASIC METABOLIC PANEL
Anion gap: 11 (ref 5–15)
Anion gap: 10 (ref 5–15)
BUN: 11 mg/dL (ref 6–20)
BUN: 9 mg/dL (ref 6–20)
CALCIUM: 8.3 mg/dL — AB (ref 8.9–10.3)
CHLORIDE: 99 mmol/L — AB (ref 101–111)
CO2: 23 mmol/L (ref 22–32)
CO2: 25 mmol/L (ref 22–32)
CREATININE: 0.94 mg/dL (ref 0.61–1.24)
Calcium: 8 mg/dL — ABNORMAL LOW (ref 8.9–10.3)
Chloride: 100 mmol/L — ABNORMAL LOW (ref 101–111)
Creatinine, Ser: 1.03 mg/dL (ref 0.61–1.24)
GFR calc Af Amer: >60 mL/min (ref >60)
GFR calc Af Amer: >60 mL/min (ref >60)
GFR calc non Af Amer: >60 mL/min (ref >60)
GLUCOSE: 95 mg/dL (ref 65–99)
Glucose, Bld: 71 mg/dL (ref 65–99)
POTASSIUM: 3.8 mmol/L (ref 3.5–5.1)
POTASSIUM: 3.7 mmol/L (ref 3.5–5.1)
SODIUM: 135 mmol/L (ref 135–145)
Sodium: 133 mmol/L — ABNORMAL LOW (ref 135–145)

## 2016-06-25 LAB — TYPE AND SCREEN
ABO/RH(D): A POS
Antibody Screen: NEGATIVE
UNIT DIVISION: 0
UNIT DIVISION: 0
UNIT DIVISION: 0
UNIT DIVISION: 0
UNIT DIVISION: 0
UNIT DIVISION: 0
Unit division: 0
Unit division: 0

## 2016-06-25 LAB — PROTIME-INR
INR: 1.07
Prothrombin Time: 13.9 seconds (ref 11.4–15.2)

## 2016-06-25 LAB — CK
CK TOTAL: 4748 U/L — AB (ref 49–397)
Total CK: 6333 U/L — ABNORMAL HIGH (ref 49–397)
Total CK: 2810 U/L — ABNORMAL HIGH (ref 49–397)

## 2016-06-25 LAB — HEPARIN LEVEL (UNFRACTIONATED): Heparin Unfractionated: 0.31 IU/mL (ref 0.30–0.70)

## 2016-06-25 SURGERY — AMPUTATION, ABOVE KNEE
Anesthesia: General | Site: Leg Upper | Laterality: Left

## 2016-06-25 MED ORDER — HYDROMORPHONE HCL 1 MG/ML IJ SOLN
INTRAMUSCULAR | Status: AC
  Filled 2016-06-25: qty 1

## 2016-06-25 MED ORDER — ETOMIDATE 2 MG/ML IV SOLN
INTRAVENOUS | Status: AC
  Filled 2016-06-25: qty 10

## 2016-06-25 MED ORDER — LIDOCAINE HCL (CARDIAC) 20 MG/ML IV SOLN
INTRAVENOUS | Status: DC | PRN
Start: 2016-06-25 — End: 2016-06-25
  Administered 2016-06-25: 60 mg via INTRAVENOUS

## 2016-06-25 MED ORDER — FENTANYL CITRATE (PF) 100 MCG/2ML IJ SOLN
INTRAMUSCULAR | Status: AC
  Filled 2016-06-25: qty 4

## 2016-06-25 MED ORDER — METOPROLOL TARTRATE 5 MG/5ML IV SOLN: 2.0000 mg | Freq: Once | INTRAVENOUS | Status: DC

## 2016-06-25 MED ORDER — NEOSTIGMINE METHYLSULFATE 10 MG/10ML IV SOLN
INTRAVENOUS | Status: DC | PRN
  Administered 2016-06-25: 3 mg via INTRAVENOUS

## 2016-06-25 MED ORDER — LABETALOL HCL 5 MG/ML IV SOLN
INTRAVENOUS | Status: DC | PRN
  Administered 2016-06-25: 5 mg via INTRAVENOUS

## 2016-06-25 MED ORDER — PROPOFOL 10 MG/ML IV BOLUS
INTRAVENOUS | Status: AC
  Filled 2016-06-25: qty 20

## 2016-06-25 MED ORDER — FENTANYL CITRATE (PF) 100 MCG/2ML IJ SOLN
INTRAMUSCULAR | Status: DC | PRN
  Administered 2016-06-25: 100 ug via INTRAVENOUS
  Administered 2016-06-25 (×3): 50 ug via INTRAVENOUS

## 2016-06-25 MED ORDER — DEXMEDETOMIDINE HCL IN NACL 200 MCG/50ML IV SOLN
0.4000 ug/kg/h | INTRAVENOUS | Status: DC
  Administered 2016-06-25: 0.5 ug/kg/h via INTRAVENOUS
  Administered 2016-06-26: 0.7 ug/kg/h via INTRAVENOUS
  Administered 2016-06-26 (×2): 0.5 ug/kg/h via INTRAVENOUS
  Administered 2016-06-27: 0.7 ug/kg/h via INTRAVENOUS
  Filled 2016-06-25 (×5): qty 50

## 2016-06-25 MED ORDER — GLYCOPYRROLATE 0.2 MG/ML IJ SOLN
INTRAMUSCULAR | Status: DC | PRN
  Administered 2016-06-25: 0.4 mg via INTRAVENOUS

## 2016-06-25 MED ORDER — HEPARIN (PORCINE) IN NACL 100-0.45 UNIT/ML-% IJ SOLN
1100.0000 [IU]/h | INTRAMUSCULAR | Status: DC
  Administered 2016-06-25: 800 [IU]/h via INTRAVENOUS
  Administered 2016-06-26: 900 [IU]/h via INTRAVENOUS
  Filled 2016-06-25 (×2): qty 250

## 2016-06-25 MED ORDER — CEFAZOLIN SODIUM-DEXTROSE 2-4 GM/100ML-% IV SOLN
2.0000 g | INTRAVENOUS | Status: DC
  Filled 2016-06-25 (×2): qty 100

## 2016-06-25 MED ORDER — METOPROLOL TARTRATE 5 MG/5ML IV SOLN
INTRAVENOUS | Status: AC
  Administered 2016-06-25: 2 mg
  Filled 2016-06-25: qty 5

## 2016-06-25 MED ORDER — FENTANYL CITRATE (PF) 100 MCG/2ML IJ SOLN
INTRAMUSCULAR | Status: AC
  Filled 2016-06-25: qty 2

## 2016-06-25 MED ORDER — ETOMIDATE 2 MG/ML IV SOLN
INTRAVENOUS | Status: DC | PRN
  Administered 2016-06-25: 14 mg via INTRAVENOUS

## 2016-06-25 MED ORDER — HYDROMORPHONE HCL 1 MG/ML IJ SOLN
0.2500 mg | INTRAMUSCULAR | Status: DC | PRN
  Administered 2016-06-25: 0.5 mg via INTRAVENOUS

## 2016-06-25 MED ORDER — PROMETHAZINE HCL 25 MG/ML IJ SOLN: 6.2500 mg | INTRAMUSCULAR | Status: DC | PRN

## 2016-06-25 MED ORDER — LACTATED RINGERS IV SOLN
INTRAVENOUS | Status: DC | PRN
  Administered 2016-06-25: 08:00:00 via INTRAVENOUS

## 2016-06-25 MED ORDER — 0.9 % SODIUM CHLORIDE (POUR BTL) OPTIME
TOPICAL | Status: DC | PRN
  Administered 2016-06-25: 1000 mL

## 2016-06-25 MED ORDER — ONDANSETRON HCL 4 MG/2ML IJ SOLN
INTRAMUSCULAR | Status: DC | PRN
  Administered 2016-06-25: 4 mg via INTRAVENOUS

## 2016-06-25 MED ORDER — ROCURONIUM BROMIDE 100 MG/10ML IV SOLN
INTRAVENOUS | Status: DC | PRN
  Administered 2016-06-25: 50 mg via INTRAVENOUS

## 2016-06-25 SURGICAL SUPPLY — 49 items
BANDAGE ACE 4X5 VEL STRL LF (GAUZE/BANDAGES/DRESSINGS) ×2 IMPLANT
BANDAGE ACE 6X5 VEL STRL LF (GAUZE/BANDAGES/DRESSINGS) ×2 IMPLANT
BANDAGE ELASTIC 6 VELCRO ST LF (GAUZE/BANDAGES/DRESSINGS) ×2 IMPLANT
BNDG COHESIVE 6X5 TAN STRL LF (GAUZE/BANDAGES/DRESSINGS) ×2 IMPLANT
BNDG GAUZE ELAST 4 BULKY (GAUZE/BANDAGES/DRESSINGS) ×2 IMPLANT
CANISTER SUCTION 2500CC (MISCELLANEOUS) ×2 IMPLANT
CLIP TI MEDIUM 6 (CLIP) ×2 IMPLANT
COVER SURGICAL LIGHT HANDLE (MISCELLANEOUS) ×2 IMPLANT
DRAIN CHANNEL 19F RND (DRAIN) IMPLANT
DRAPE ORTHO SPLIT 77X108 STRL (DRAPES) ×3
DRAPE PROXIMA HALF (DRAPES) IMPLANT
DRAPE SURG ORHT 6 SPLT 77X108 (DRAPES) ×3 IMPLANT
DRSG ADAPTIC 3X8 NADH LF (GAUZE/BANDAGES/DRESSINGS) ×2 IMPLANT
ELECT REM PT RETURN 9FT ADLT (ELECTROSURGICAL) ×2
ELECTRODE REM PT RTRN 9FT ADLT (ELECTROSURGICAL) ×1 IMPLANT
EVACUATOR SILICONE 100CC (DRAIN) IMPLANT
GAUZE SPONGE 4X4 12PLY STRL (GAUZE/BANDAGES/DRESSINGS) ×4 IMPLANT
GAUZE SPONGE 4X4 16PLY XRAY LF (GAUZE/BANDAGES/DRESSINGS) ×2 IMPLANT
GLOVE BIOGEL PI IND STRL 7.0 (GLOVE) ×1 IMPLANT
GLOVE BIOGEL PI IND STRL 7.5 (GLOVE) ×5 IMPLANT
GLOVE BIOGEL PI INDICATOR 7.0 (GLOVE) ×1
GLOVE BIOGEL PI INDICATOR 7.5 (GLOVE) ×5
GLOVE ECLIPSE 7.0 STRL STRAW (GLOVE) ×2 IMPLANT
GLOVE SURG SS PI 7.5 STRL IVOR (GLOVE) ×4 IMPLANT
GOWN STRL REUS W/ TWL LRG LVL3 (GOWN DISPOSABLE) ×2 IMPLANT
GOWN STRL REUS W/ TWL XL LVL3 (GOWN DISPOSABLE) ×1 IMPLANT
GOWN STRL REUS W/TWL LRG LVL3 (GOWN DISPOSABLE) ×2
GOWN STRL REUS W/TWL XL LVL3 (GOWN DISPOSABLE) ×1
KIT BASIN OR (CUSTOM PROCEDURE TRAY) ×2 IMPLANT
KIT ROOM TURNOVER OR (KITS) ×2 IMPLANT
NS IRRIG 1000ML POUR BTL (IV SOLUTION) ×2 IMPLANT
PACK GENERAL/GYN (CUSTOM PROCEDURE TRAY) ×2 IMPLANT
PAD ARMBOARD 7.5X6 YLW CONV (MISCELLANEOUS) ×4 IMPLANT
SAW GIGLI STERILE 20 (MISCELLANEOUS) ×2 IMPLANT
SPONGE GAUZE 4X4 12PLY STER LF (GAUZE/BANDAGES/DRESSINGS) ×2 IMPLANT
STAPLER VISISTAT 35W (STAPLE) ×2 IMPLANT
STOCKINETTE IMPERVIOUS LG (DRAPES) ×2 IMPLANT
SUT ETHILON 3 0 PS 1 (SUTURE) IMPLANT
SUT SILK 0 TIES 10X30 (SUTURE) ×2 IMPLANT
SUT SILK 2 0 (SUTURE) ×1
SUT SILK 2-0 18XBRD TIE 12 (SUTURE) ×1 IMPLANT
SUT SILK 3 0 (SUTURE) ×1
SUT SILK 3-0 18XBRD TIE 12 (SUTURE) ×1 IMPLANT
SUT VIC AB 2-0 CT1 18 (SUTURE) ×6 IMPLANT
TAPE UMBILICAL COTTON 1/8X30 (MISCELLANEOUS) ×2 IMPLANT
TOWEL OR 17X24 6PK STRL BLUE (TOWEL DISPOSABLE) ×2 IMPLANT
TOWEL OR 17X26 10 PK STRL BLUE (TOWEL DISPOSABLE) ×2 IMPLANT
UNDERPAD 30X30 (UNDERPADS AND DIAPERS) ×2 IMPLANT
WATER STERILE IRR 1000ML POUR (IV SOLUTION) ×2 IMPLANT

## 2016-06-25 NOTE — Interval H&P Note (Signed)
History and Physical Interval Note:  06/25/2016 7:33 AM  Stephen Bowen  has presented today for surgery, with the diagnosis of ISCHEMIC LEFT LEG/NON-VIABLE LEFT LEG  The various methods of treatment have been discussed with the patient and family. After consideration of risks, benefits and other options for treatment, the patient has consented to  Procedure(s): AMPUTATION ABOVE KNEE (Left) as a surgical intervention .  The patient's history has been reviewed, patient examined, no change in status, stable for surgery.  I have reviewed the patient's chart and labs.  Questions were answered to the patient's satisfaction.     Durene CalBrabham, Wells

## 2016-06-25 NOTE — Op Note (Signed)
    Patient name: Stephen ApleyJerome D Ducharme MRN: [id number] DOB: 1955-10-27 Sex: male  06/19/2016 - 06/25/2016 Pre-operative Diagnosis: Ischemic left leg Post-operative diagnosis:  Same Surgeon:  Durene CalBrabham, Wells Assistants:  M.Collins Procedure:   Left above knee amputation Anesthesia:  General Blood Loss:  See anesthesia record Specimens:  Left leg  Findings:  Viable muscle at amputation site  Indications:  The patient initially presented with an occluded left femoral diatl bypass.  Initially thrombolysis was attempted, however he developed bleeding at the canulation site so this was stopped and he went for surgical revascularization.  During his procedure, he had a large MI and suffered a stroke.  His bypass occluded, but he was not a candidate for a return trip to the OR because of his MI and stroke.  His leg became non-viable.  I delayed AKA because of his co-morbidities.  I became worried that he might develop systemic sepsis because of his ischemic and therefore I elected to proceed with AKA.  I discussed this with his brother who understands that he is at high risk for major complications from his AKA.  Procedure:  The patient was identified in the holding area and [facility]  The patient was then placed supine on the table. general anesthesia was administered.  The patient was prepped and draped in the usual sterile fashion.  A time out was called and antibiotics were administered.  A fishmouth incision was made proximal to the patella.  Cautery was used to divide the subcutaneous tissue and muscle. The femur was isolated.  A periosteal elevator was ised.  A gigle saw was then used to transect the femur, beveling the anterior surface.  A rasp was used to smooth the bone edge.  The neuro vasecular bundle was then isolated and clamped and divided.  The remaining muscle was divided and the leg was removed.  I then individually ligated the SFA and femoral vein as well as the femoral nerve.  The  were each divided proximal to the cut edge of the femur with 0 silk ties.  I had to resect another 2 cm of the femur so that I could close the incision without tension.  The wound was then irrigated.  Hemostasis was achieved.  The fascia was closed with interrupted 2-0 vicryl, and the skin was closed with staples.  Sterile dressings were applied.  There were no complications.      Disposition:  To PACU in stable condition.   Juleen ChinaV. Wells Brabham, M.D. [facility] [place] Office: [phone] Pager:  [phone]

## 2016-06-25 NOTE — Anesthesia Postprocedure Evaluation (Addendum)
Anesthesia Post Note  Patient: Stephen ApleyJerome D Bowen  Procedure(s) Performed: Procedure(s) (LRB): AMPUTATION ABOVE KNEE (Left)  Patient location during evaluation: PACU Anesthesia Type: General Level of consciousness: confused Pain management: pain level controlled Vital Signs Assessment: post-procedure vital signs reviewed and stable Respiratory status: spontaneous breathing, nonlabored ventilation, respiratory function stable and patient connected to nasal cannula oxygen Cardiovascular status: blood pressure returned to baseline and stable Postop Assessment: no signs of nausea or vomiting Anesthetic complications: no       Last Vitals:  Vitals:   06/25/16 1130 06/25/16 1134  BP: (!) 184/94   Pulse: (!) 121   Resp: (!) 22   Temp:  37 C    Last Pain:  Vitals:   06/25/16 1134  TempSrc: Oral  PainSc:                  Henrietta Cieslewicz S

## 2016-06-25 NOTE — Progress Notes (Addendum)
   At the time of rounds, the patient was in the operating room for amputation.  No significant events overnight.  Will see later today or in a.m. depending upon clinical course.  Repeat EKG post op and recheck troponin in a.m.    Addendum: After return from surgery he seems stable. Says his chest , hands and leg are "tight". VS stable with actual elevation in BP. ECG not yet done.

## 2016-06-25 NOTE — H&P (View-Only) (Signed)
    Subjective  -   Screaming out over night and given sedation   Physical Exam:  Resting comfortably this am No changes in non-viable left leg       Assessment/Plan:    Discussed with cardiology yesterday regarding timing of left leg AKA, and will try to delay surgery as long as possible to give his heart time to heal Creatinine remains stable Slight elevation in CK this am. Will plan for left leg AKA tomorrow as his Hb is down today and he is to receive blood this am. Will discuss with family when they arrive today.  He is at high risk for additional cardiac complication during surgery given his recent MI Appreciate neurology input.  No additional stroke workup currently    Brabham, Wells 06/24/2016 10:40 AM --  Vitals:   06/24/16 0845 06/24/16 0900  BP: 131/74 130/71  Pulse:  66  Resp:  13  Temp: 98.7 F (37.1 C) 98.7 F (37.1 C)    Intake/Output Summary (Last 24 hours) at 06/24/16 1040 Last data filed at 06/24/16 0845  Gross per 24 hour  Intake           1639.4 ml  Output             1385 ml  Net            254.4 ml     Laboratory CBC    Component Value Date/Time   WBC 11.8 (H) 06/24/2016 0219   HGB 7.0 (L) 06/24/2016 0219   HCT 19.8 (L) 06/24/2016 0219   PLT 224 06/24/2016 0219    BMET    Component Value Date/Time   NA 132 (L) 06/24/2016 0833   K 3.9 06/24/2016 0833   CL 100 (L) 06/24/2016 0833   CO2 25 06/24/2016 0833   GLUCOSE 80 06/24/2016 0833   BUN 10 06/24/2016 0833   CREATININE 0.86 06/24/2016 0833   CREATININE 0.92 11/13/2013 1031   CALCIUM 8.0 (L) 06/24/2016 0833   GFRNONAA >60 06/24/2016 0833   GFRNONAA >89 11/13/2013 1031   GFRAA >60 06/24/2016 0833   GFRAA >89 11/13/2013 1031    COAG Lab Results  Component Value Date   INR 0.97 06/19/2016   INR 0.92 06/19/2016   INR 1.63 02/19/2016   No results found for: PTT  Antibiotics Anti-infectives    Start     Dose/Rate Route Frequency Ordered Stop   06/20/16 1730   ceFAZolin (ANCEF) IVPB 2g/100 mL premix     2 g 200 mL/hr over 30 Minutes Intravenous Every 8 hours 06/20/16 1621 06/20/16 1814       V. [place] Brabham IV, M.D. [facility] [place] Office: [phone] Pager:  [phone] 

## 2016-06-25 NOTE — Progress Notes (Signed)
BP 196-210/ 100-102, Hr 80's, pt awake , denies pain. Dr Okey Dupreose updated, new order for IV Lopressor.

## 2016-06-25 NOTE — Progress Notes (Signed)
ANTICOAGULATION CONSULT NOTE - Follow Up Consult  Pharmacy Consult for Heparin Indication: S/P thrombectomy, new stroke  No Known Allergies  Patient Measurements: Height: 5\' 8"  (172.7 cm) Weight: 122 lb 5.7 oz (55.5 kg) IBW/kg (Calculated) : 68.4 Heparin Dosing Weight: 55  Vital Signs: Temp: 97 F (36.1 C) (01/01 0959) Temp Source: Oral (01/01 0407) BP: 173/92 (01/01 1030) Pulse Rate: 101 (01/01 1030)  Labs:  Recent Labs  06/22/16 1634 06/22/16 2048  06/23/16 0336  06/24/16 0219 06/24/16 [id number] 06/24/16 1618 06/24/16 1627 06/24/16 2123 06/25/16 0312 06/25/16 0925  HGB 9.7*  --   --  8.1*  --  7.0*  --  10.9*  --   --  9.9* 11.7*  HCT 27.4*  --   --  22.4*  --  19.8*  --  31.6*  --   --  29.4* 35.1*  PLT 161  --   --  155  --  224  --  234  --   --  292 333  LABPROT  --   --   --   --   --   --   --   --   --   --   --  13.9  INR  --   --   --   --   --   --   --   --   --   --   --  1.07  HEPARINUNFRC 0.35  --   --  0.42  --  0.37  --   --   --   --  0.31  --   CREATININE  --   --   < > 0.88  --   --  0.86  --   --   --  0.94 1.03  CKTOTAL 9,828* 7,681*  --  6,240*  < > 5,070* 5,753*  --  5,001* 5,740*  --   --   TROPONINI 20.73* 19.35*  --   --   --   --   --   --   --   --   --   --   < > = values in this interval not displayed.  Estimated Creatinine Clearance: 59.9 mL/min (by C-G formula based on SCr of 1.03 mg/dL).   Medications:  Scheduled:  . aspirin  325 mg Oral Daily  . atorvastatin  40 mg Oral q1800  . HYDROmorphone      . mouth rinse  15 mL Mouth Rinse BID  . metoprolol tartrate  25 mg Oral BID  . pantoprazole  40 mg Oral Daily  . sodium chloride flush  3 mL Intravenous Q12H    Assessment: 60yo male on heparin for arterial occlusion.  Now s/p cath directed alteplase 12/27 & thrombectomy of L-femoral to PT BPG and Intraoperatively he developed ST elevation with post-op AMS.  Code STEMI initially called then cancelled( but will need cath) and Code  Stroke called- CT(+)evolving embolic stroke and s/p thromboembolectomy of left femoral to PT bypass graft on 12/28. S/p AKA today and to resume heparin ~ 6 hr post-op. Heparin level this AM was therapeutic.   Goal of Therapy:  Heparin level 0.3-0.5 units/ml,  Monitor platelets by anticoagulation protocol: Yes   Plan:  Restart heparin gtt at 800 units/hr this afternoon at 1630 Check an 8 hr heparin level Daily heparin level, CBC  Lysle Pearlachel Terrian Ridlon, PharmD, BCPS Pager # [phone] 06/25/2016 10:40 AM

## 2016-06-25 NOTE — Transfer of Care (Signed)
Immediate Anesthesia Transfer of Care Note  Patient: Stephen Bowen  Procedure(s) Performed: Procedure(s): AMPUTATION ABOVE KNEE (Left)  Patient Location: PACU  Anesthesia Type:General  Level of Consciousness: awake, alert , oriented and patient cooperative  Airway & Oxygen Therapy: Patient Spontanous Breathing and Patient connected to nasal cannula oxygen  Post-op Assessment: Report given to RN and Post -op Vital signs reviewed and stable  Post vital signs: Reviewed and stable  Last Vitals:  Vitals:   06/25/16 0920 06/25/16 0928  BP:  (!) 211/100  Pulse:  94  Resp:  14  Temp: 36.3 C     Last Pain:  Vitals:   06/25/16 0920  TempSrc:   PainSc: 0-No pain      Patients Stated Pain Goal: 4 (06/21/16 0600)  Complications: No apparent anesthesia complications

## 2016-06-25 NOTE — Anesthesia Preprocedure Evaluation (Signed)
Anesthesia Evaluation  Patient identified by MRN, date of birth, ID band Patient awake    Reviewed: Allergy & Precautions, NPO status , Patient's Chart, lab work & pertinent test results  Airway Mallampati: II  TM Distance: >3 FB Neck ROM: Full    Dental no notable dental hx.    Pulmonary Current Smoker,    Pulmonary exam normal breath sounds clear to auscultation       Cardiovascular hypertension, + Past MI and + Peripheral Vascular Disease  Normal cardiovascular exam Rhythm:Regular Rate:Normal     Neuro/Psych CVA negative psych ROS   GI/Hepatic negative GI ROS, Neg liver ROS,   Endo/Other  negative endocrine ROS  Renal/GU negative Renal ROS  negative genitourinary   Musculoskeletal negative musculoskeletal ROS (+)   Abdominal   Peds negative pediatric ROS (+)  Hematology negative hematology ROS (+)   Anesthesia Other Findings   Reproductive/Obstetrics negative OB ROS                             Anesthesia Physical Anesthesia Plan  ASA: IV and emergent  Anesthesia Plan: General   Post-op Pain Management:    Induction: Intravenous  Airway Management Planned: Oral ETT  Additional Equipment:   Intra-op Plan:   Post-operative Plan: Possible Post-op intubation/ventilation  Informed Consent: I have reviewed the patients History and Physical, chart, labs and discussed the procedure including the risks, benefits and alternatives for the proposed anesthesia with the patient or authorized representative who has indicated his/her understanding and acceptance.   Dental advisory given  Plan Discussed with: CRNA and Surgeon  Anesthesia Plan Comments:         Anesthesia Quick Evaluation

## 2016-06-26 ENCOUNTER — Encounter (HOSPITAL_COMMUNITY): Payer: Self-pay | Admitting: Surgery

## 2016-06-26 LAB — CBC
HEMATOCRIT: 26.3 % — AB (ref 39.0–52.0)
HEMATOCRIT: 29.8 % — AB (ref 39.0–52.0)
HEMOGLOBIN: 9.2 g/dL — AB (ref 13.0–17.0)
Hemoglobin: 10.2 g/dL — ABNORMAL LOW (ref 13.0–17.0)
MCH: 30.3 pg (ref 26.0–34.0)
MCH: 30.8 pg (ref 26.0–34.0)
MCHC: 35 g/dL (ref 30.0–36.0)
MCHC: 34.2 g/dL (ref 30.0–36.0)
MCV: 86.5 fL (ref 78.0–100.0)
MCV: 90 fL (ref 78.0–100.0)
Platelets: 365 10*3/uL (ref 150–400)
Platelets: 326 10*3/uL (ref 150–400)
RBC: 3.04 MIL/uL — AB (ref 4.22–5.81)
RBC: 3.31 MIL/uL — ABNORMAL LOW (ref 4.22–5.81)
RDW: 14.3 % (ref 11.5–15.5)
RDW: 14.7 % (ref 11.5–15.5)
WBC: 14.1 10*3/uL — AB (ref 4.0–10.5)
WBC: 15.1 10*3/uL — AB (ref 4.0–10.5)

## 2016-06-26 LAB — TROPONIN I: Troponin I: 6.36 ng/mL (ref <0.03)

## 2016-06-26 LAB — CK
CK TOTAL: 2549 U/L — AB (ref 49–397)
CK TOTAL: 2246 U/L — AB (ref 49–397)
CK TOTAL: 2170 U/L — AB (ref 49–397)
Total CK: 1820 U/L — ABNORMAL HIGH (ref 49–397)

## 2016-06-26 LAB — HEPARIN LEVEL (UNFRACTIONATED)
Heparin Unfractionated: 0.18 IU/mL — ABNORMAL LOW (ref 0.30–0.70)
Heparin Unfractionated: 0.26 IU/mL — ABNORMAL LOW (ref 0.30–0.70)
Heparin Unfractionated: 0.38 IU/mL (ref 0.30–0.70)

## 2016-06-26 LAB — PREPARE RBC (CROSSMATCH)

## 2016-06-26 MED ORDER — SODIUM CHLORIDE 0.9 % IV SOLN: Freq: Once | INTRAVENOUS | Status: AC

## 2016-06-26 MED ORDER — SODIUM CHLORIDE 0.9 % IV SOLN: Freq: Once | INTRAVENOUS | Status: DC

## 2016-06-26 MED ORDER — ISOSORBIDE MONONITRATE ER 30 MG PO TB24
30.0000 mg | ORAL_TABLET | Freq: Every day | ORAL | Status: DC
  Administered 2016-06-26 – 2016-06-27 (×2): 30 mg via ORAL
  Filled 2016-06-26 (×2): qty 1

## 2016-06-26 MED ORDER — ATORVASTATIN CALCIUM 80 MG PO TABS
80.0000 mg | ORAL_TABLET | Freq: Every day | ORAL | Status: DC
  Administered 2016-06-26 – 2016-07-09 (×12): 80 mg via ORAL
  Filled 2016-06-26 (×13): qty 1

## 2016-06-26 NOTE — Progress Notes (Signed)
Patient Name: Stephen Bowen Date of Encounter: 06/26/2016  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 Orlene ErmConversation is improving and more appropriate. S/P left AKA.  Inpatient Medications    Scheduled Meds: . aspirin  325 mg Oral Daily  . atorvastatin  40 mg Oral q1800  . mouth rinse  15 mL Mouth Rinse BID  . metoprolol tartrate  25 mg Oral BID  . pantoprazole  40 mg Oral Daily  . sodium chloride flush  3 mL Intravenous Q12H   Continuous Infusions: . sodium chloride 50 mL/hr at 06/26/16 1200  . dexmedetomidine 0.497 mcg/kg/hr (06/26/16 1200)  . heparin 900 Units/hr (06/26/16 1200)   PRN Meds: sodium chloride, acetaminophen **OR** acetaminophen, alum & mag hydroxide-simeth, guaiFENesin-dextromethorphan, hydrALAZINE, HYDROmorphone (DILAUDID) injection, metoprolol, morphine injection, ondansetron, oxyCODONE-acetaminophen, phenol, sodium chloride flush   Vital Signs    Vitals:   06/26/16 1000 06/26/16 1040 06/26/16 1100 06/26/16 1200  BP: (!) 141/83 138/81 (!) 148/83 136/80  Pulse: 93 93 88 84  Resp: 13 14 10 11   Temp:  99.3 F (37.4 C)    TempSrc:  Oral    SpO2: 100% 100% 100% 100%  Weight:      Height:        Intake/Output Summary (Last 24 hours) at 06/26/16 1245 Last data filed at 06/26/16 1200  Gross per 24 hour  Intake          2489.76 ml  Output             2245 ml  Net           244.76 ml   Filed Weights   06/20/16 1027  Weight: 122 lb 5.7 oz (55.5 kg)    Physical Exam   Malnourished-appearing 61 year old gentleman appearing somewhat confused. GEN: Chronically ill  and in no acute distress.  HEENT: Grossly normal.  Neck: Supple, with moderate JVD. No obvious carotid bruit is heard. Cardiac: A soft S4 gallop is audible. RRR, no murmurs, rubs. No clubbing, cyanosis, edema.   Extremities: left AKA Respiratory:  Respirations regular and unlabored, clear to auscultation bilaterally. GI: Soft, nontender, nondistended, BS + x 4. MS: s/p left AKA Skin: warm and dry, no rash. Neuro:  Strength decreased in left leg Psych: AAOx0.  Detached affect.  Labs    CBC  Recent Labs  06/25/16 0925 06/26/16 0250  WBC 14.4* 14.1*  HGB 11.7* 9.2*  HCT 35.1* 26.3*  MCV 88.4 86.5  PLT 333 365   Basic Metabolic Panel  Recent Labs  06/25/16 0312 06/25/16 0925  NA 133* 135  K 3.8 3.7  CL 99* 100*  CO2 23 25  GLUCOSE 71 95  BUN 11 9  CREATININE 0.94 1.03  CALCIUM 8.0* 8.3*   Liver Function Tests No results for input(s): AST, ALT, ALKPHOS, BILITOT, PROT, ALBUMIN in the last 72 hours. No results for input(s): LIPASE, AMYLASE in the last 72 hours. Cardiac Enzymes  Recent Labs  06/25/16 2111 06/26/16 0250 06/26/16 0945  CKTOTAL 2,810* 2,549* 2,246*  TROPONINI  --  6.36*  --  Telemetry    Sinus tachycardia without significant ventricular ectopy. - Personally Reviewed  ECG  NSR with LVH and overall otherwise normal appearance..- Personally Reviewed  Radiology    No results found.  Cardiac Studies   ECHOCARDIOGRAM 06/23/16: Study Conclusions  - Left ventricle: The cavity size was normal. Wall thickness was   increased in a pattern of moderate LVH. Systolic function was   normal. The estimated ejection fraction was in the range of 60%   to 65%. There is akinesis of the apicalanteroseptal and   inferolateral myocardium. Features are consistent with a   pseudonormal left ventricular filling pattern, with concomitant   abnormal relaxation and increased filling pressure (grade 2   diastolic dysfunction).  Impressions:  - Since  last echo, apical wall motion abnormality is new.  Patient Profile     61 year old with vasculopathic history that now includes left lower extremity ischemic necrosis, postoperative acute stroke with speech difficulty, and simultaneous development of anterior ST elevation myocardial infarction during surgical thrombectomy of left lower extremity. Now s/p left AKA for occlusion of blood flow to left lower extremity.  Assessment & Plan    1. Acute anterior ST elevation infarction ECG has essentially normalized. Suspect spontaneous reperfusion or collateral recruitment. 2-D Doppler echocardiogram with small area of apical infarction.Optimize post MI therapy - LA nitrate, beta blocker, aspirin, plavix. Will need coronary angiography (or nuclear scintigraphy - second choice) once very stable. Plavix only when cleared by neuro and vascular surgery.  2. 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 ? Okay to add plavix. 3. Left lower extremity above knee amputation. So far no acute cardiac problems post op. 4. Alcohol abuse and at risk for development of withdrawal.   Signed, Lesleigh Noe, MD  06/26/2016, 12:45 PM

## 2016-06-26 NOTE — Progress Notes (Signed)
  Progress Note    06/26/2016 8:00 AM 1 Day Post-Op  Subjective:  Stable, not oriented this morning  Vitals:   06/26/16 0500 06/26/16 0600  BP: (!) 144/82 (!) 160/78  Pulse: 91 90  Resp: (!) 9 10  Temp:      Physical Exam: Disoriented Left aka site with dressing cdi Abdomen is soft  CBC    Component Value Date/Time   WBC 14.1 (H) 06/26/2016 0250   RBC 3.04 (L) 06/26/2016 0250   HGB 9.2 (L) 06/26/2016 0250   HCT 26.3 (L) 06/26/2016 0250   PLT 365 06/26/2016 0250   MCV 86.5 06/26/2016 0250   MCH 30.3 06/26/2016 0250   MCHC 35.0 06/26/2016 0250   RDW 14.3 06/26/2016 0250   LYMPHSABS 1.1 02/13/2016 1519   MONOABS 0.6 02/13/2016 1519   EOSABS 0.0 02/13/2016 1519   BASOSABS 0.0 02/13/2016 1519    BMET    Component Value Date/Time   NA 135 06/25/2016 0925   K 3.7 06/25/2016 0925   CL 100 (L) 06/25/2016 0925   CO2 25 06/25/2016 0925   GLUCOSE 95 06/25/2016 0925   BUN 9 06/25/2016 0925   CREATININE 1.03 06/25/2016 0925   CREATININE 0.92 11/13/2013 1031   CALCIUM 8.3 (L) 06/25/2016 0925   GFRNONAA >60 06/25/2016 0925   GFRNONAA >89 11/13/2013 1031   GFRAA >60 06/25/2016 0925   GFRAA >89 11/13/2013 1031    INR    Component Value Date/Time   INR 1.07 06/25/2016 0925     Intake/Output Summary (Last 24 hours) at 06/26/16 0800 Last data filed at 06/26/16 0600  Gross per 24 hour  Intake          2559.36 ml  Output             2510 ml  Net            49.36 ml     Assessment:  61 y.o. male is s/p redo left leg bypass complicated by MI and stroke. He remains disoriented to person, place and time. He is on heparin drip at this time and requiring precedex for sedation but is otherwise hd stable.   Plan: -transfuse 1 unit prbc's  -possible transfer to step down unit -does not need anticoagulation at this point from vascular standpoint but will continue given recent MI   Tashawna Thom C. Randie Heinzain, MD [facility] [place] Office:  [phone] Pager: [phone]  06/26/2016 8:00 AM

## 2016-06-26 NOTE — Progress Notes (Signed)
ANTICOAGULATION CONSULT NOTE - Follow Up Consult  Pharmacy Consult for Heparin Indication: S/P thrombectomy, new stroke  No Known Allergies  Patient Measurements: Height: 5\' 8"  (172.7 cm) Weight: 122 lb 5.7 oz (55.5 kg) IBW/kg (Calculated) : 68.4 Heparin Dosing Weight: 55  Vital Signs: Temp: 99.5 F (37.5 C) (01/01 2346) Temp Source: Oral (01/01 2346) BP: 125/83 (01/01 2300) Pulse Rate: 89 (01/01 2300)  Labs:  Recent Labs  06/24/16 0219 06/24/16 0833 06/24/16 1618  06/25/16 0312 06/25/16 0925 06/25/16 1345 06/25/16 2111 06/25/16 2357  HGB 7.0*  --  10.9*  --  9.9* 11.7*  --   --   --   HCT 19.8*  --  31.6*  --  29.4* 35.1*  --   --   --   PLT 224  --  234  --  292 333  --   --   --   LABPROT  --   --   --   --   --  13.9  --   --   --   INR  --   --   --   --   --  1.07  --   --   --   HEPARINUNFRC 0.37  --   --   --  0.31  --   --   --  0.18*  CREATININE  --  0.86  --   --  0.94 1.03  --   --   --   CKTOTAL 5,070* 5,753*  --   < >  --  6,333* 4,748* 2,810*  --   < > = values in this interval not displayed.  Estimated Creatinine Clearance: 59.9 mL/min (by C-G formula based on SCr of 1.03 mg/dL).   Assessment: 61yo male on heparin for arterial occlusion.  Now s/p cath directed alteplase 12/27 & thrombectomy of L-femoral to PT BPG and Intraoperatively he developed ST elevation with post-op AMS.  Code STEMI initially called then cancelled( but will need cath) and Code Stroke called- CT(+)evolving embolic stroke and s/p thromboembolectomy of left femoral to PT bypass graft on 12/28. S/p AKA 1/1 - heparin resumed post-op. Heparin level down to subtherapeutic (0.18) on 800 units/hr. No issues with line or bleeding reported per RN. Noted pt therapeutic on this rate for multiple days prior to surgery.  Goal of Therapy:  Heparin level 0.3-0.5 units/ml,  Monitor platelets by anticoagulation protocol: Yes   Plan:  Increase heparin gtt slightly to 900 units/hr  F/u 8 hr  heparin level  Christoper Fabianaron Tony Friscia, PharmD, BCPS Clinical pharmacist, pager [phone]  06/26/2016 12:40 AM

## 2016-06-26 NOTE — Evaluation (Signed)
Speech Language Pathology Evaluation Patient Details Name: Stephen Bowen Baldonado MRN: [id number] DOB: September 13, 1955 Today's Date: 06/26/2016 Time: [id number] SLP Time Calculation (min) (ACUTE ONLY): 7 min  Problem List:  Patient Active Problem List   Diagnosis Date Noted  . 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 Aphasia   . Hypotension 06/21/2016  . Stroke (HCC) 06/21/2016  . Acute MI, anterolateral wall, initial episode of care (HCC)   . 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 Preoperative cardiovascular examination   . PAD (peripheral artery disease) (HCC) 02/13/2016  . Leg pain 02/13/2016  . Hyperkalemia 02/13/2016  . Lactic acid increased 02/13/2016  . Essential hypertension 02/13/2016  . Seborrheic dermatitis 08/31/2015  . Actinic keratosis of multiple sites of head and neck 08/31/2015  . Tobacco use disorder 08/31/2015  . Skin ulcer of scalp (HCC) 08/31/2015  . Atherosclerosis of native arteries of extremity with intermittent claudication (HCC) 07/07/2015  . Abnormal EKG 11/13/2013  . Hypertensive urgency 11/13/2013   Past Medical History:  Past Medical History:  Diagnosis Date  . HTN (hypertension)   . Noncompliance   . PAD (peripheral artery disease) (HCC)    a.  s/p left femoral to PT artery bypass with propaten on 02/16/16.   Past Surgical History:  Past Surgical History:  Procedure Laterality Date  . AMPUTATION Left 06/25/2016   Procedure: AMPUTATION ABOVE KNEE;  Surgeon: Nada LibmanVance W Brabham, MD;  [facility];  Service: Vascular;  Laterality: Left;  . BYPASS GRAFT POPLITEAL TO TIBIAL Left 06/21/2016   Procedure: BYPASS GRAFT POPLITEAL TO TIBIAL USING GORE PROPATEN GRAFT;  Surgeon: Maeola HarmanBrandon Christopher Cain, MD;  [facility];  Service: Vascular;  Laterality: Left;  . EMBOLECTOMY Left 06/21/2016   Procedure: EMBOLECTOMY left lower extremity.;  Surgeon: Maeola HarmanBrandon Christopher Cain, MD;  [facility];  Service: Vascular;  Laterality: Left;  . ENDARTERECTOMY FEMORAL Left 02/16/2016   Procedure: LEFT FEMORAL ENDARTERECTOMY;  Surgeon: Maeola HarmanBrandon Christopher Cain, MD;  [facility];  Service: Vascular;  Laterality: Left;  . FEMORAL BYPASS  02/16/2016   LEFT FEMORAL-POSTERIOR TIBIAL ARTERY BYPASS  WITH PROPATEN 6 MM X 80 CM VASCULAR RING GRAFT (Left)  . FEMORAL-TIBIAL BYPASS GRAFT Left 02/16/2016   Procedure: LEFT FEMORAL-POSTERIOR TIBIAL ARTERY BYPASS  WITH PROPATEN 6 MM X 80 CM VASCULAR RING GRAFT;  Surgeon: Maeola HarmanBrandon Christopher Cain, MD;  [facility];  Service: Vascular;  Laterality: Left;  . PATCH ANGIOPLASTY Left 02/16/2016   Procedure: PATCH ANGIOPLASTY WITH Livia SnellenXENOSURE BIOLOGIC PATCH 1 CM X 6 CM;  Surgeon: Maeola HarmanBrandon Christopher Cain, MD;  [facility];  Service: Vascular;  Laterality: Left;  . PERIPHERAL VASCULAR CATHETERIZATION N/A 07/07/2015   Procedure: Abdominal Aortogram w/Lower Extremity;  Surgeon: Fransisco HertzBrian L Chen, MD;  [facility];  Service: Cardiovascular;  Laterality: N/A;  . PERIPHERAL VASCULAR CATHETERIZATION N/A 02/15/2016   Procedure: Abdominal Aortogram w/Lower Extremity;  Surgeon: Maeola HarmanBrandon Christopher Cain, MD;  [facility];  Service: Cardiovascular;  Laterality: N/A;  . PERIPHERAL VASCULAR CATHETERIZATION Bilateral 06/20/2016   Procedure: Lower Extremity Angiography;  Surgeon: Maeola HarmanBrandon Christopher Cain, MD;  [facility];  Service: Cardiovascular;  Laterality: Bilateral;  limited runoff on rt leg thrombolysis lt leg bypass graft  . 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   HPI:  Stephen Bowen Waymire is a 61 y.o. male, who  presents with two week history of progressively worsening left foot pain. Per chart had left femoral to PT artery bypass with propaten on 02/16/16. PMH: tobacco abuse, HT, PAD. On 12/28 s/p thromboembolectomy patient was having ST changes and cardiology was consulted. The  patient on awakening from anesthesia was aphasic and IP Code Stroke was called. Patient was not administered IV t-PA. Head CT 06/21/16 showed evidence of acute infarct involving a portion of the posterior mid to inferior right cerebellum as well as much of the left occipital lobe, particularly medially and mid portions. 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   Assessment / Plan / Recommendation Clinical Impression  Pt oriented to name only. Followed one step basic commands (3/3). Significant impairments evident in the areas of attention, problem solving, awareness. Speech intelligible. Appears to have visual disturbance which he affirmed but unable to describe. He will need intense ST intervention for cognitive abilities.      SLP Assessment  Patient needs continued Speech Lanaguage Pathology Services    Follow Up Recommendations  Inpatient Rehab    Frequency and Duration min 2x/week  2 weeks      SLP Evaluation Cognition  Overall Cognitive Status: Impaired/Different from baseline Arousal/Alertness: Awake/alert Orientation Level: Oriented to person;Disoriented to place;Disoriented to time;Disoriented to situation Attention: Sustained Sustained Attention: Impaired Sustained Attention Impairment: Verbal basic;Functional basic Memory:  (TBA) Awareness: Impaired Awareness Impairment: Intellectual impairment;Emergent impairment Problem Solving: Impaired Problem Solving Impairment: Verbal basic;Functional basic Behaviors: Restless Safety/Judgment: Impaired       Comprehension  Auditory Comprehension Overall Auditory Comprehension: Impaired Yes/No Questions: Not tested Commands: Impaired Two Step Basic Commands: 0-24% accurate Interfering Components: Attention;Visual impairments;Processing speed Visual Recognition/Discrimination Discrimination: Not tested Reading Comprehension Reading Status: Not tested    Expression Expression Primary Mode of Expression: Verbal Verbal  Expression Overall Verbal Expression:  (no language disturbance with short responses) Initiation: Impaired Level of Generative/Spontaneous Verbalization: Sentence Repetition:  (NT) Naming: Not tested Pragmatics: Impairment Impairments: Abnormal affect;Eye contact Written Expression Dominant Hand: Right Written Expression: Not tested   Oral / Motor  Oral Motor/Sensory Function Overall Oral Motor/Sensory Function: Within functional limits Motor Speech Overall Motor Speech: Appears within functional limits for tasks assessed Respiration: Within functional limits Phonation: Normal Resonance: Within functional limits Articulation: Within functional limitis Intelligibility: Intelligible Motor Planning: Witnin functional limits   GO                    Royce Macadamia 06/26/2016, 2:13 PM  Breck Coons Kamerin Axford M.Ed [employer] [phone]

## 2016-06-26 NOTE — Progress Notes (Signed)
ANTICOAGULATION CONSULT NOTE - Follow Up Consult  Pharmacy Consult for Heparin Indication: ACS, new stroke  No Known Allergies  Patient Measurements: Height: 5\' 8"  (172.7 cm) Weight: 122 lb 5.7 oz (55.5 kg) IBW/kg (Calculated) : 68.4 Heparin Dosing Weight: 55  Vital Signs: Temp: 99.2 F (37.3 C) (01/02 1607) Temp Source: Oral (01/02 1607) BP: 139/85 (01/02 1900) Pulse Rate: 97 (01/02 1900)  Labs:  Recent Labs  06/24/16 [id number]  06/25/16 0312 06/25/16 0925  06/25/16 2357 06/26/16 0250 06/26/16 0945 06/26/16 1223 06/26/16 1734  HGB  --   < > 9.9* 11.7*  --   --  9.2*  --  10.2*  --   HCT  --   < > 29.4* 35.1*  --   --  26.3*  --  29.8*  --   PLT  --   < > 292 333  --   --  365  --  326  --   LABPROT  --   --   --  13.9  --   --   --   --   --   --   INR  --   --   --  1.07  --   --   --   --   --   --   HEPARINUNFRC  --   --  0.31  --   --  0.18*  --  0.38  --  0.26*  CREATININE 0.86  --  0.94 1.03  --   --   --   --   --   --   CKTOTAL 5,753*  < >  --  6,333*  < >  --  2,549* 2,246*  --  2,170*  TROPONINI  --   --   --   --   --   --  6.36*  --   --   --   < > = values in this interval not displayed.  Estimated Creatinine Clearance: 59.9 mL/min (by C-G formula based on SCr of 1.03 mg/dL).   Assessment: 61 yo male initially on heparin for arterial occlusion.  Now s/p cath-directed alteplase 12/27 & thrombectomy of L-femoral to PT BPG, and ultimate AKA on 1/1.    Intraoperatively, pt developed ST elevation with post-op AMS. Code STEMI initially called, then cancelled (but will need cath), and Code Stroke called - CT(+) evolving embolic stroke.  Heparin resumed post-op.   Heparin level SUB therapeutic on 900 units/hr. Per Vascular note, does not need anticoagulation at this point from a Vascular standpoint but are continuing given recent MI.  Goal of Therapy:  Heparin level 0.3-0.5 units/ml,  Monitor platelets by anticoagulation protocol: Yes   Plan:  Increase  heparin drip to 1000 units/hr Daily heparin level/CBC Monitor for s/sx bleeding F/u anticoagulation plan  Dixie DialsKimberly Brittiany Wiehe, Pharm.D., BCPS Clinical Pharmacist Pager [phone] 06/26/2016 7:33 PM

## 2016-06-26 NOTE — Progress Notes (Signed)
Rehab Admissions Coordinator Note:  Patient was screened by Trish MageLogue, Keshan Reha M for appropriateness for an Inpatient Acute Rehab Consult. Noted SLP recommending inpatient rehab consult.  Will await PT/OT evals and recommendations to determine functional deficits.  Trish MageLogue, Janine Reller M 06/26/2016, 8:23 AM  I can be reached at [phone].

## 2016-06-26 NOTE — Progress Notes (Addendum)
ANTICOAGULATION CONSULT NOTE - Follow Up Consult  Pharmacy Consult for Heparin Indication: S/P thrombectomy, new stroke  No Known Allergies  Patient Measurements: Height: 5\' 8"  (172.7 cm) Weight: 122 lb 5.7 oz (55.5 kg) IBW/kg (Calculated) : 68.4 Heparin Dosing Weight: 55  Vital Signs: Temp: 98.7 F (37.1 C) (01/02 0821) Temp Source: Oral (01/02 0821) BP: 145/106 (01/02 0800) Pulse Rate: 98 (01/02 0830)  Labs:  Recent Labs  06/24/16 0219 06/24/16 [id number]  06/25/16 0312 06/25/16 0925 06/25/16 1345 06/25/16 2111 06/25/16 2357 06/26/16 0250  HGB 7.0*  --   < > 9.9* 11.7*  --   --   --  9.2*  HCT 19.8*  --   < > 29.4* 35.1*  --   --   --  26.3*  PLT 224  --   < > 292 333  --   --   --  365  LABPROT  --   --   --   --  13.9  --   --   --   --   INR  --   --   --   --  1.07  --   --   --   --   HEPARINUNFRC 0.37  --   --  0.31  --   --   --  0.18*  --   CREATININE  --  0.86  --  0.94 1.03  --   --   --   --   CKTOTAL 5,070* 5,753*  < >  --  4,0986,333* 4,748* 2,810*  --  2,549*  TROPONINI  --   --   --   --   --   --   --   --  6.36*  < > = values in this interval not displayed.  Estimated Creatinine Clearance: 59.9 mL/min (by C-G formula based on SCr of 1.03 mg/dL).   Assessment: 61yo male on heparin for arterial occlusion.  Now s/p cath-directed alteplase 12/27 & thrombectomy of L-femoral to PT BPG, and intraoperatively developed ST elevation with post-op AMS. Code STEMI initially called, then cancelled (but will need cath), and Code Stroke called - CT(+) evolving embolic stroke, now s/p thromboembolectomy of left femoral to PT bypass graft on 12/28. S/p AKA 1/1 - heparin resumed post-op.   Heparin level now therapeutic (0.38) on rate increase to 900 units/hr. No issues with line or bleeding reported. Per Vascular note, does not need anticoagulation at this point from a Vascular standpoint but are continuing given recent MI.   Goal of Therapy:  Heparin level 0.3-0.5 units/ml,   Monitor platelets by anticoagulation protocol: Yes   Plan:  Heparin drip at 900 units/h 6h heparin level to confirm Daily heparin level/CBC Monitor for s/sx bleeding F/u anticoagulation plan   Babs BertinHaley Adellyn Capek, PharmD, BCPS Clinical Pharmacist 06/26/2016 8:46 AM

## 2016-06-26 NOTE — Plan of Care (Signed)
Patient not progressing per pathway d/t confusion

## 2016-06-27 DIAGNOSIS — F17200 Nicotine dependence, unspecified, uncomplicated: Secondary | ICD-10-CM

## 2016-06-27 LAB — BASIC METABOLIC PANEL
Anion gap: 9 (ref 5–15)
BUN: 5 mg/dL — ABNORMAL LOW (ref 6–20)
CALCIUM: 7.9 mg/dL — AB (ref 8.9–10.3)
CHLORIDE: 102 mmol/L (ref 101–111)
CO2: 24 mmol/L (ref 22–32)
CREATININE: 0.78 mg/dL (ref 0.61–1.24)
GFR calc Af Amer: >60 mL/min (ref >60)
GFR calc non Af Amer: >60 mL/min (ref >60)
GLUCOSE: 103 mg/dL — AB (ref 65–99)
Potassium: 3.6 mmol/L (ref 3.5–5.1)
Sodium: 135 mmol/L (ref 135–145)

## 2016-06-27 LAB — TYPE AND SCREEN
Blood Product Expiration Date: 201801032359
ISSUE DATE / TIME: 201801020821
UNIT TYPE AND RH: 6200

## 2016-06-27 LAB — CBC
HCT: 27.2 % — ABNORMAL LOW (ref 39.0–52.0)
Hemoglobin: 9.2 g/dL — ABNORMAL LOW (ref 13.0–17.0)
MCH: 30 pg (ref 26.0–34.0)
MCHC: 33.8 g/dL (ref 30.0–36.0)
MCV: 88.6 fL (ref 78.0–100.0)
PLATELETS: 398 10*3/uL (ref 150–400)
RBC: 3.07 MIL/uL — ABNORMAL LOW (ref 4.22–5.81)
RDW: 14.5 % (ref 11.5–15.5)
WBC: 15.7 10*3/uL — ABNORMAL HIGH (ref 4.0–10.5)

## 2016-06-27 LAB — CK: Total CK: 1603 U/L — ABNORMAL HIGH (ref 49–397)

## 2016-06-27 LAB — HEPARIN LEVEL (UNFRACTIONATED): Heparin Unfractionated: 0.21 IU/mL — ABNORMAL LOW (ref 0.30–0.70)

## 2016-06-27 MED ORDER — THIAMINE HCL 100 MG/ML IJ SOLN
100.0000 mg | Freq: Every day | INTRAMUSCULAR | Status: DC
  Administered 2016-06-27: 100 mg via INTRAVENOUS
  Filled 2016-06-27: qty 2

## 2016-06-27 MED ORDER — LORAZEPAM 2 MG/ML IJ SOLN
0.0000 mg | Freq: Four times a day (QID) | INTRAMUSCULAR | Status: AC
  Administered 2016-06-29: 1 mg via INTRAVENOUS
  Filled 2016-06-27: qty 1

## 2016-06-27 MED ORDER — ADULT MULTIVITAMIN W/MINERALS CH
1.0000 | ORAL_TABLET | Freq: Every day | ORAL | Status: DC
  Administered 2016-06-27 – 2016-07-10 (×13): 1 via ORAL
  Filled 2016-06-27 (×14): qty 1

## 2016-06-27 MED ORDER — ISOSORBIDE MONONITRATE ER 30 MG PO TB24
90.0000 mg | ORAL_TABLET | Freq: Every day | ORAL | Status: DC
  Administered 2016-06-28: 90 mg via ORAL
  Filled 2016-06-27 (×2): qty 3

## 2016-06-27 MED ORDER — LORAZEPAM 2 MG/ML IJ SOLN
1.0000 mg | Freq: Four times a day (QID) | INTRAMUSCULAR | Status: AC | PRN
  Administered 2016-06-28 – 2016-06-30 (×4): 1 mg via INTRAVENOUS
  Filled 2016-06-27 (×3): qty 1

## 2016-06-27 MED ORDER — DEXMEDETOMIDINE HCL IN NACL 200 MCG/50ML IV SOLN
0.4000 ug/kg/h | INTRAVENOUS | Status: DC
  Filled 2016-06-27 (×4): qty 50

## 2016-06-27 MED ORDER — VITAMIN B-1 100 MG PO TABS
100.0000 mg | ORAL_TABLET | Freq: Every day | ORAL | Status: DC
  Administered 2016-06-28 – 2016-07-10 (×12): 100 mg via ORAL
  Filled 2016-06-27 (×14): qty 1

## 2016-06-27 MED ORDER — FOLIC ACID 1 MG PO TABS
1.0000 mg | ORAL_TABLET | Freq: Every day | ORAL | Status: DC
  Administered 2016-06-27 – 2016-07-10 (×14): 1 mg via ORAL
  Filled 2016-06-27 (×14): qty 1

## 2016-06-27 MED ORDER — METOPROLOL TARTRATE 25 MG PO TABS
25.0000 mg | ORAL_TABLET | Freq: Once | ORAL | Status: AC
  Administered 2016-06-27: 25 mg via ORAL

## 2016-06-27 MED ORDER — ASPIRIN EC 81 MG PO TBEC
81.0000 mg | DELAYED_RELEASE_TABLET | Freq: Every day | ORAL | Status: DC
  Administered 2016-06-28 – 2016-07-10 (×13): 81 mg via ORAL
  Filled 2016-06-27 (×13): qty 1

## 2016-06-27 MED ORDER — METOPROLOL TARTRATE 50 MG PO TABS
50.0000 mg | ORAL_TABLET | Freq: Two times a day (BID) | ORAL | Status: DC
  Administered 2016-06-27 – 2016-06-29 (×4): 50 mg via ORAL
  Filled 2016-06-27 (×5): qty 1

## 2016-06-27 MED ORDER — LORAZEPAM 1 MG PO TABS
1.0000 mg | ORAL_TABLET | Freq: Four times a day (QID) | ORAL | Status: AC | PRN
  Administered 2016-06-27: 1 mg via ORAL
  Filled 2016-06-27: qty 1

## 2016-06-27 MED ORDER — ISOSORBIDE MONONITRATE ER 30 MG PO TB24
60.0000 mg | ORAL_TABLET | Freq: Once | ORAL | Status: AC
  Administered 2016-06-27: 60 mg via ORAL

## 2016-06-27 MED ORDER — CLOPIDOGREL BISULFATE 75 MG PO TABS
75.0000 mg | ORAL_TABLET | Freq: Every day | ORAL | Status: DC
  Administered 2016-06-27 – 2016-07-10 (×14): 75 mg via ORAL
  Filled 2016-06-27 (×14): qty 1

## 2016-06-27 MED ORDER — LORAZEPAM 2 MG/ML IJ SOLN
0.0000 mg | Freq: Two times a day (BID) | INTRAMUSCULAR | Status: AC
  Filled 2016-06-27: qty 1

## 2016-06-27 MED ORDER — BISACODYL 5 MG PO TBEC
5.0000 mg | DELAYED_RELEASE_TABLET | Freq: Every day | ORAL | Status: DC | PRN
  Administered 2016-06-27 – 2016-07-03 (×5): 5 mg via ORAL
  Filled 2016-06-27 (×5): qty 1

## 2016-06-27 MED ORDER — AMLODIPINE BESYLATE 2.5 MG PO TABS
2.5000 mg | ORAL_TABLET | Freq: Every day | ORAL | Status: DC
  Administered 2016-06-27 – 2016-06-28 (×2): 2.5 mg via ORAL
  Filled 2016-06-27 (×2): qty 1

## 2016-06-27 MED ORDER — DEXMEDETOMIDINE HCL IN NACL 200 MCG/50ML IV SOLN
0.5000 ug/kg/h | INTRAVENOUS | Status: DC
  Administered 2016-06-27: 0.7 ug/kg/h via INTRAVENOUS
  Administered 2016-06-27: 0.5 ug/kg/h via INTRAVENOUS
  Administered 2016-06-28: 0.7 ug/kg/h via INTRAVENOUS
  Administered 2016-06-28: 0.3 ug/kg/h via INTRAVENOUS
  Administered 2016-06-28: 0.7 ug/kg/h via INTRAVENOUS
  Administered 2016-06-28: 0.3 ug/kg/h via INTRAVENOUS
  Filled 2016-06-27: qty 50

## 2016-06-27 NOTE — Plan of Care (Signed)
Dr. Randie Heinzain paged re: pt's agitation, BP. Transfer cancelled. Orders received

## 2016-06-27 NOTE — Evaluation (Signed)
Physical Therapy Evaluation Patient Details Name: Stephen Bowen MRN: [id number] DOB: 1955-10-30 Today's Date: 06/27/2016   History of Present Illness  61 y.o. male who presented to [facility] for management of left ischemic foot secondary to occluded left femoral to posterior tibial bypass. He underwent thromboembolectomy of the bypass graft.  During the procedure the patient was having ST changes and cardiology was consulted. Pt with MI. The patient on awakening from anesthesia was aphasic and IP Code Stroke was called. CT 12/28 acute infarct involving a portion of the posterior mid to inferior right cerebellum as well as much of the left occipital lobe, particularly medially and mid portions. 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   Clinical Impression  Patient presents with decreased mobility due to deficits listed in PT problem list including decreased balance, vision, awareness, strength, cognition, proprioception, pain and will benefit from skilled PT in the acute setting to allow maximized functional independence in hopes to return home with some assist.  Feel he is appropriate for CIR level rehab prior to d/c home.     Follow Up Recommendations CIR    Equipment Recommendations  Other (comment) (TBA)    Recommendations for Other Services Rehab consult     Precautions / Restrictions Precautions Precautions: Fall;Other (comment) Precaution Comments: R AKA, L heel wound, watch BP      Mobility  Bed Mobility Overal bed mobility: Needs Assistance Bed Mobility: Supine to Sit     Supine to sit: Max assist;+2 for physical assistance     General bed mobility comments: Pt assisted minimally with attempting to pull with assistance of therapist. Used bed pad to help move pt to EOB  Transfers Overall transfer level: Needs assistance   Transfers: Sit to/from Stand;Stand Pivot Transfers Sit to Stand: Max assist;+2 physical assistance Stand pivot transfers: Max assist;+2 physical assistance        General transfer comment: physical assist to appropirately position R foot on floor prio rot transfer. Once pivoted to chair, pt able to stand with mod A. Pt followed commands to reach back to chair with B hands when sitting  Ambulation/Gait                Stairs            Wheelchair Mobility    Modified Rankin (Stroke Patients Only) Modified Rankin (Stroke Patients Only) Pre-Morbid Rankin Score: No symptoms Modified Rankin: Severe disability     Balance Overall balance assessment: Needs assistance   Sitting balance-Leahy Scale: Poor Sitting balance - Comments: Pt initially with posterior lean. After sitting midline for a few minutes, pt able to maintain midline posture with S with UE support     Standing balance-Leahy Scale: Poor Standing balance comment: eventually standing with mod A                             Pertinent Vitals/Pain Pain Assessment: Faces Pain Score: 4  Faces Pain Scale: Hurts even more Pain Location: L leg Pain Descriptors / Indicators: Sharp Pain Intervention(s): Limited activity within patient's tolerance;Repositioned    Home Living Family/patient expects to be discharged to:: Unsure Living Arrangements: Other relatives   Type of Home: House           Additional Comments: Pt unable to give information. Per previous admission, pt lives with other relatives in a house    Prior Function Level of Independence: Independent         Comments: did not  use an assistive device for gait.  (per previous admission )     Hand Dominance   Dominant Hand: Right (per chart)    Extremity/Trunk Assessment   Upper Extremity Assessment Upper Extremity Assessment: Defer to OT evaluation    Lower Extremity Assessment Lower Extremity Assessment: RLE deficits/detail;LLE deficits/detail RLE Deficits / Details: AAROM WFL, patient initially not moving to command, but some spontaneous movement, seems generally weak, but able to  move antigravity; likely some proprioceptive deficits LLE Deficits / Details: L AKA; able to move some, but with pain    Cervical / Trunk Assessment Cervical / Trunk Assessment: Normal  Communication   Communication: Expressive difficulties;Receptive difficulties (to be further assessed)  Cognition Arousal/Alertness: Awake/alert Behavior During Therapy: Restless Overall Cognitive Status: Impaired/Different from baseline Area of Impairment: Orientation;Attention;Memory;Following commands;Safety/judgement;Awareness;Problem solving Orientation Level: Disoriented to;Place;Time;Situation Current Attention Level: Sustained   Following Commands: Follows one step commands inconsistently Safety/Judgement: Decreased awareness of safety;Decreased awareness of deficits Awareness: Intellectual Problem Solving: Slow processing;Difficulty sequencing;Requires verbal cues;Requires tactile cues General Comments: Patient with h/o substance abuse on CIWA protocol here; was very slow to initiate active movement or ADL's or even to respone to stimulus in visual field initially; improved with time and assist during session    General Comments      Exercises     Assessment/Plan    PT Assessment Patient needs continued PT services  PT Problem List Decreased activity tolerance;Decreased balance;Decreased strength;Decreased mobility;Decreased safety awareness;Decreased cognition;Decreased knowledge of use of DME;Impaired sensation;Pain          PT Treatment Interventions DME instruction;Therapeutic exercise;Patient/family education;Therapeutic activities;Balance training;Wheelchair mobility training;Functional mobility training;Neuromuscular re-education    PT Goals (Current goals can be found in the Care Plan section)  Acute Rehab PT Goals Patient Stated Goal: none stated PT Goal Formulation: Patient unable to participate in goal setting Time For Goal Achievement: 07/11/16 Potential to Achieve Goals:  Fair    Frequency Min 4X/week   Barriers to discharge        Co-evaluation PT/OT/SLP Co-Evaluation/Treatment: Yes Reason for Co-Treatment: Complexity of the patient's impairments (multi-system involvement) PT goals addressed during session: Balance;Mobility/safety with mobility OT goals addressed during session: ADL's and self-care       End of Session Equipment Utilized During Treatment: Gait belt Activity Tolerance: Patient limited by pain Patient left: in chair;with call bell/phone within reach;with chair alarm set;with restraints reapplied;with nursing/sitter in room Nurse Communication: Mobility status         Time: [id number] PT Time Calculation (min) (ACUTE ONLY): 36 min   Charges:   PT Evaluation $PT Eval High Complexity: 1 Procedure     PT G CodesElray Mcgregor:        Cynthia Wynn 06/27/2016, 11:47 AM Sheran Lawlessyndi Wynn, PT [phone] 06/27/2016

## 2016-06-27 NOTE — Progress Notes (Signed)
ANTICOAGULATION CONSULT NOTE - Follow Up Consult  Pharmacy Consult for heparin Indication: MI in setting of CVA  Labs:  Recent Labs  06/25/16 0312 06/25/16 0925  06/26/16 0250 06/26/16 0945 06/26/16 1223 06/26/16 1734 06/26/16 2142 06/27/16 0311  HGB 9.9* 11.7*  --  9.2*  --  10.2*  --   --  9.2*  HCT 29.4* 35.1*  --  26.3*  --  29.8*  --   --  27.2*  PLT 292 333  --  365  --  326  --   --  398  LABPROT  --  13.9  --   --   --   --   --   --   --   INR  --  1.07  --   --   --   --   --   --   --   HEPARINUNFRC 0.31  --   < >  --  0.38  --  0.26*  --  0.21*  CREATININE 0.94 1.03  --   --   --   --   --   --  0.78  CKTOTAL  --  6,333*  < > 2,549* 2,246*  --  2,170* 1,820* 1,603*  TROPONINI  --   --   --  6.36*  --   --   --   --   --   < > = values in this interval not displayed.   Assessment: 61yo male now w/ lower heparin level after rate increase.  Goal of Therapy:  Heparin level 0.3-0.5 units/ml   Plan:  Will increase heparin gtt by 2 units/kg/hr to 1100 units/hr and check level in 6hr.  Vernard GamblesVeronda Espyn Radwan, PharmD, BCPS  06/27/2016,5:22 AM

## 2016-06-27 NOTE — Progress Notes (Signed)
Speech Language Pathology Treatment: Dysphagia;Cognitive-Linquistic  Patient Details Name: Stephen Bowen MRN: [id number] DOB: 06/21/1956 Today's Date: 06/27/2016 Time: [id number] SLP Time Calculation (min) (ACUTE ONLY): 22 min  Assessment / Plan / Recommendation Clinical Impression  Patient is alert and responds verbally to basic questions. Speech is primarily inappropriate, however patient is able to state his name. Provided verbal cues for orientation; immediate recall 0/3 with choice from field of 2. He follows 4/5 basic commands. RN reports he has been tolerating liquids and taking pills whole with water, requiring max cues.  Attempted PO trials of upgraded textures. Patient requires mod cues for bolus acceptance of thin liquids via straw and pureed solids via teaspoon. With moderate tactile cues, he self-administers thin liquids via cup. No overt signs of aspiration noted. Patient repeatedly declined regular solids despite max cues, appears due to cognitive vs motor function. Oropharyngeal swallow appears functional at bedside for liquids and pureed solids. Diet upgraded to regular with thin liquids per PA this morning. Recommend supervision and assistance with feeding; please contact SLP should patient display signs of aspiration with current diet. SLP will follow up for diet tolerance and cognitive-linguistic treatment.   HPI HPI: Stephen Bowen is a 61 y.o. male, who presents with two week history of progressively worsening left foot pain. Per chart had left femoral to PT artery bypass with propaten on 02/16/16. PMH: tobacco abuse, HT, PAD. On 12/28 s/p thromboembolectomy patient was having ST changes and cardiology was consulted. The patient on awakening from anesthesia was aphasic and IP Code Stroke was called. Patient was not administered IV t-PA. Head CT 06/21/16 showed evidence of acute infarct involving a portion of the posterior mid to inferior right cerebellum as well as much of the left  occipital lobe, particularly medially and mid portions. 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      SLP Plan  Continue with current plan of care     Recommendations  Diet recommendations: Thin liquid;Other(comment) (Diet upgraded to regular per PA; call SLP if difficulty) Liquids provided via: Cup;Straw Medication Administration: Whole meds with liquid Supervision: Staff to assist with self feeding Compensations: Minimize environmental distractions;Slow rate;Small sips/bites Postural Changes and/or Swallow Maneuvers: Seated upright 90 degrees                Oral Care Recommendations: Oral care BID Follow up Recommendations: Inpatient Rehab Plan: Continue with current plan of care       GO             Rondel BatonMary Beth Hawley Pavia, MS CF-SLP Speech-Language Pathologist [phone]  Stephen Bowen 06/27/2016, 12:12 PM

## 2016-06-27 NOTE — Progress Notes (Signed)
PT and OT have evaluated pt. and are recommending IP Rehab.  Patient was screened by Weldon PickingSusan Sharlie Shreffler for appropriateness for an Inpatient Acute Rehab consult.  At this time, we are recommending Inpatient Rehab consult.  Please order consult if you are agreeable.  Weldon PickingSusan Rayan Ines PT Inpatient Rehab Admissions Coordinator Cell [phone] Office [phone]   '

## 2016-06-27 NOTE — Progress Notes (Signed)
[facility] who states she has spoken with pt's brother who confirms no caregiver available for pt. and that SNF is anticipated.  Request for IP Rehab consult deferred at this time.    Weldon PickingSusan Sami Roes PT Inpatient Rehab Admissions Coordinator Cell [phone] Office [phone]

## 2016-06-27 NOTE — Progress Notes (Signed)
Occupational Therapy Evaluation Patient Details Name: MARKON JARES MRN: [id number] DOB: 1955-11-18 Today's Date: 06/27/2016    History of Present Illness 61 y.o. male who presented to [facility] for management of left ischemic foot secondary to occluded left femoral to posterior tibial bypass. He underwent thromboembolectomy of the bypass graft.  During the procedure the patient was having ST changes and cardiology was consulted. Pt with MI. The patient on awakening from anesthesia was aphasic and IP Code Stroke was called. CT 12/28 acute infarct involving a portion of the posterior mid to inferior right cerebellum as well as much of the left occipital lobe, particularly medially and mid portions. 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    Clinical Impression   Per chart, PTA, pt was independent with mobility and ADL. Pt unable to give information at this time. Pt presents with significant deficits as listed below but participated well with session. Pt able to stand pivot transfer to recliner with max A +2. After pivoting, pt able to maintain balance at midline with mod A. Pt able to wash face on command, as well as drink from a cup. In addition to below deficits, pt appears to be delirius. Recommend implementing measures to reduce delirium, in addition to providing a sitter to reduce need for restraints.  At this time, recommend CIR for rehab. Will follow acutely to facilitate DC to next venue of care and maximize functional level of independence with ADL and mobility.     Follow Up Recommendations  CIR;Supervision/Assistance - 24 hour    Equipment Recommendations  3 in 1 bedside commode (will further assess)    Recommendations for Other Services Rehab consult  Sitter to reduce delirium     Precautions / Restrictions Precautions Precautions: Fall;Other (comment) (L AKA;. wound R heel)      Mobility Bed Mobility Overal bed mobility: Needs Assistance Bed Mobility: Supine to Sit     Supine to sit: Max  assist;+2 for physical assistance     General bed mobility comments: Pt assisted minimally with attempting to pull with assistance of therapist. Used bed pad to help move pt to EOB  Transfers Overall transfer level: Needs assistance   Transfers: Sit to/from Stand;Stand Pivot Transfers Sit to Stand: Max assist;+2 physical assistance Stand pivot transfers: Max assist;+2 physical assistance       General transfer comment: physical assist to appropirately position R foot on floor prio rot transfer. Once pivoted to chair, pt able to stand with mod A. Pt followed commands to reach back to chair with B hands when sitting    Balance Overall balance assessment: Needs assistance   Sitting balance-Leahy Scale: Fair Sitting balance - Comments: Pt initially with posterior lean. After sitting midlien for a few minutes, pt able to maintain midline posture with S.      Standing balance-Leahy Scale: Poor                              ADL Overall ADL's : Needs assistance/impaired Eating/Feeding: Moderate assistance Eating/Feeding Details (indicate cue type and reason): hand over hand to initially bring cup to mouth. Pt able to hold cup and bring to mouth. Pt reached for straw in cup without being told there was a straw in cup.  Grooming: Minimal assistance Grooming Details (indicate cue type and reason): Pt able to wash face on command and clean ears. Did not assess oral care. Upper Body Bathing: Moderate assistance   Lower Body Bathing:  Maximal assistance   Upper Body Dressing : Maximal assistance   Lower Body Dressing: Total assistance   Toilet Transfer: +2 for physical assistance;Maximal assistance (simulated)   Toileting- Clothing Manipulation and Hygiene: Maximal assistance Toileting - Clothing Manipulation Details (indicate cue type and reason): foley. Pt verbalizing that he had to urinate     Functional mobility during ADLs: Maximal assistance;+2 for physical assistance  (stand pivot only)       Vision Vision Assessment?: Yes Eye Alignment: Within Functional Limits Alignment/Gaze Preference:  (L bias but will keep head at midline) Tracking/Visual Pursuits: Impaired - to be further tested in functional context Saccades: Impaired - to be further tested in functional context Convergence: Impaired - to be further tested in functional context Visual Fields: Impaired-to be further tested in functional context Depth Perception: Overshoots Additional Comments: Pt reaching for items although poor accuracy. Pt states " I can see you". Blinks to threat. does not track objects or demonstrate visual attention to objects. Will further assess   Perception Perception Spatial deficits: impaired. over/undershooting. Unable to name colors. ?visual hallucinations   Praxis Praxis Praxis-Other Comments: will further assess    Pertinent Vitals/Pain Pain Assessment: Faces Pain Score: 4  Faces Pain Scale: Hurts even more Pain Location: L leg Pain Descriptors / Indicators: Sharp Pain Intervention(s): Monitored during session;Other (comment) (brief sharp pain, pt verbalized it subsided)     Hand Dominance Right (per chart)   Extremity/Trunk Assessment Upper Extremity Assessment Upper Extremity Assessment: Generalized weakness (Will further assess. Using BUE for functional tasks)   Lower Extremity Assessment Lower Extremity Assessment: Defer to PT evaluation (? proprioceptive deficits RLE)   Cervical / Trunk Assessment Cervical / Trunk Assessment: Normal   Communication Communication Communication: Expressive difficulties;Receptive difficulties (to be further assessed)   Cognition Arousal/Alertness: Awake/alert Behavior During Therapy: Restless Overall Cognitive Status: Impaired/Different from baseline Area of Impairment: Orientation;Attention;Memory;Following commands;Safety/judgement;Awareness;Problem solving Orientation Level: Disoriented  to;Place;Time;Situation Current Attention Level: Sustained   Following Commands: Follows one step commands inconsistently Safety/Judgement: Decreased awareness of safety;Decreased awareness of deficits Awareness: Intellectual Problem Solving: Slow processing;Difficulty sequencing;Requires verbal cues;Requires tactile cues     General Comments       Exercises       Shoulder Instructions      Home Living Family/patient expects to be discharged to:: Unsure Living Arrangements: Other relatives   Type of Home: House                           Additional Comments: Pt unable to give information. Per previous admission, pt lives with other relatives in a house      Prior Functioning/Environment Level of Independence: Independent        Comments: did not use an assistive device for gait.  (per previous admission)        OT Problem List: Decreased strength;Decreased range of motion;Decreased activity tolerance;Impaired balance (sitting and/or standing);Impaired vision/perception;Decreased coordination;Decreased cognition;Decreased safety awareness;Decreased knowledge of use of DME or AE;Cardiopulmonary status limiting activity;Impaired sensation;Pain   OT Treatment/Interventions: Self-care/ADL training;Therapeutic exercise;Neuromuscular education;DME and/or AE instruction;Therapeutic activities;Cognitive remediation/compensation;Visual/perceptual remediation/compensation;Patient/family education;Balance training    OT Goals(Current goals can be found in the care plan section) Acute Rehab OT Goals Patient Stated Goal: none stated OT Goal Formulation: Patient unable to participate in goal setting Time For Goal Achievement: 07/11/16 Potential to Achieve Goals: Good ADL Goals Pt Will Perform Eating: with set-up;with supervision;sitting Pt Will Perform Grooming: with set-up;with supervision;sitting Pt Will Perform Upper Body Bathing: with  set-up;with supervision;bed  level Pt Will Transfer to Toilet: with min assist;with +2 assist;bedside commode;squat pivot transfer Additional ADL Goal #1: Pt will demonstrate emergent awareness during ADL tasks with minimal redirectional cues in nondistracting environment.   OT Frequency: Min 2X/week   Barriers to D/C:            Co-evaluation PT/OT/SLP Co-Evaluation/Treatment: Yes Reason for Co-Treatment: Complexity of the patient's impairments (multi-system involvement);Necessary to address cognition/behavior during functional activity;For patient/therapist safety;To address functional/ADL transfers   OT goals addressed during session: ADL's and self-care      End of Session Equipment Utilized During Treatment: Gait belt Nurse Communication: Mobility status;Need for lift equipment  Activity Tolerance: Patient tolerated treatment well Patient left: in chair;with call bell/phone within reach;with chair alarm set;with nursing/sitter in room;with restraints reapplied (posey belt)   Time: 1015-1055 OT Time Calculation (min): 40 min Charges:  OT General Charges $OT Visit: 1 Procedure OT Evaluation $OT Eval Moderate Complexity: 1 Procedure OT Treatments $Self Care/Home Management : 8-22 mins G-Codes:    Aretha Levi,HILLARY 06/27/2016, 11:23 AM   Luisa DagoHilary Navah Grondin, OT/L  [phone] 06/27/2016

## 2016-06-27 NOTE — Progress Notes (Addendum)
[facility]  Subjective  - Alert not oriented, verbal responds to questions.   Objective (!) 153/91 76 98.5 F (36.9 C) (Oral) 13 100%  Intake/Output Summary (Last 24 hours) at 06/27/16 0734 Last data filed at 06/27/16 0700  Gross per 24 hour  Intake          2848.12 ml  Output             1925 ml  Net           923.12 ml    Left stump viable, minimal bloody drainage lateral incision corner.  Clean dry dressing applied. Right PT/DP doppler signal, foot warm active range of motion of toes intact Herat RRR Lungs non labored breathing.  Assessment/Planning: POD # 2 Left AKA, looks viable. POD# 5 Surgeon:  Luanna SalkBrandon C. Randie Heinzain, MD Assistant: Doreatha MassedSamantha Rhyne, PA Procedure Performed: 1.  Thromboembolectomy of left femoral to PT bypass graft 2.  Harvest of left small saphenous vein 3.  Bypass from existing graft to distal PT artery with 6mm ringed ptfe with distal vein patch 4.  Left lower extremity angiogram POD# 6  Surgeon:  Luanna SalkBrandon C. Randie Heinzain, MD Procedure Performed: 1.  US guided cannulation of right common femoral artery 2.  Aortogram 3.  Left lower extremity angiogram 4.  Selective cannulation of left leg bypass graft 5.  Placement of 50cm treatment length lytic catheter  The patient was found to have acute stroke and denies any chest pain.  Acute anterior ST elevation infarction with significant enzyme elevation.  Alcohol abuse and at risk for development of withdrawal.  Start on Alcohol withdraw protocol, d/c Precedex.   D/C foley. Advance diet to heart healthy, tolerated liquids well. Ordered PT eval and treat Ordered dulcolax PO. Will likely transfer later today    Clinton GallantCOLLINS, EMMA [facility]EN 06/27/2016 7:34 AM --  Laboratory Lab Results:  Recent Labs  06/26/16 1223 06/27/16 0311  WBC 15.1* 15.7*  HGB 10.2* 9.2*  HCT 29.8* 27.2*  PLT 326 398   BMET  Recent Labs  06/25/16 0925 06/27/16 0311  NA 135 135  K 3.7 3.6  CL  100* 102  CO2 25 24  GLUCOSE 95 103*  BUN 9 5*  CREATININE 1.03 0.78  CALCIUM 8.3* 7.9*    COAG Lab Results  Component Value Date   INR 1.07 06/25/2016   INR 0.97 06/19/2016   INR 0.92 06/19/2016   No results found for: PTT   I have independently interviewed patient and agree with PA assessment and plan above. Heparin stopped and will be on dual antiplatelet. Transition from precedex to CIWA and trasnfer to 3s. Medical consult for stroke, mi, pad with amputation, cortical blindness.   Danaija Eskridge C. Randie Heinzain, MD [facility] [place] Office: [phone] Pager: [phone]

## 2016-06-27 NOTE — Progress Notes (Addendum)
Patient Name: Stephen Bowen Date of Encounter: 06/27/2016  Primary Cardiologist: C. 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 Having difficulty with a fascia. He voices no complaints.   S/P left AKA.  Inpatient Medications    Scheduled Meds: . amLODipine  2.5 mg Oral Daily  . [START ON 06/28/2016] aspirin EC  81 mg Oral Daily  . atorvastatin  80 mg Oral q1800  . clopidogrel  75 mg Oral Daily  . folic acid  1 mg Oral Daily  . [START ON 06/28/2016] isosorbide mononitrate  90 mg Oral Daily  . LORazepam  0-4 mg Intravenous Q6H   Followed by  . [START ON 06/29/2016] LORazepam  0-4 mg Intravenous Q12H  . mouth rinse  15 mL Mouth Rinse BID  . metoprolol tartrate  50 mg Oral BID  . multivitamin with minerals  1 tablet Oral Daily  . pantoprazole  40 mg Oral Daily  . sodium chloride flush  3 mL Intravenous Q12H  . thiamine  100 mg Oral Daily   Or  . thiamine  100 mg Intravenous Daily   Continuous Infusions: . sodium chloride 50 mL/hr at 06/27/16 1200  . dexmedetomidine Stopped (06/27/16 0750)   PRN Meds: sodium chloride, acetaminophen **OR** acetaminophen, alum & mag hydroxide-simeth, bisacodyl, guaiFENesin-dextromethorphan, hydrALAZINE, HYDROmorphone (DILAUDID) injection, LORazepam **OR** LORazepam, metoprolol, morphine injection, ondansetron, oxyCODONE-acetaminophen, phenol, sodium chloride flush   Vital Signs    Vitals:   06/27/16 1200 06/27/16 1212 06/27/16 1216 06/27/16 1231  BP: (!) 190/100 (!) 188/99  (!) 172/93  Pulse: 95 93  85  Resp: 20 10  19   Temp:   97.5 F (36.4 C)   TempSrc:   Oral   SpO2: 100%  100%  100%  Weight:      Height:        Intake/Output Summary (Last 24 hours) at 06/27/16 1256 Last data filed at 06/27/16 1200  Gross per 24 hour  Intake          2558.72 ml  Output             1870 ml  Net           688.72 ml   Filed Weights   06/20/16 1027  Weight: 122 lb 5.7 oz (55.5 kg)    Physical Exam  Malnourished-appearing 61 year old gentleman appearing somewhat confused. GEN: Chronically ill and in no acute distress.  HEENT: Grossly normal.  Neck: Supple, with moderate JVD. No obvious carotid bruit is heard. Cardiac: A soft S4 gallop is audible. RRR, no murmurs, rubs. No clubbing, cyanosis, edema.   Extremities: left AKA Respiratory:  Respirations regular and unlabored, clear to auscultation bilaterally. GI: Soft, nontender, nondistended, BS + x 4. MS: s/p left AKA Skin: warm and dry, no rash. Neuro:  Strength decreased in left leg Psych: AAOx0.  Detached affect.  Labs    CBC  Recent Labs  06/26/16 1223 06/27/16 0311  WBC 15.1* 15.7*  HGB 10.2* 9.2*  HCT 29.8* 27.2*  MCV 90.0 88.6  PLT 326 398   Basic Metabolic Panel  Recent Labs  06/25/16 0925 06/27/16 0311  NA 135 135  K 3.7 3.6  CL 100* 102  CO2 25 24  GLUCOSE 95 103*  BUN 9 5*  CREATININE 1.03 0.78  CALCIUM 8.3* 7.9*   Liver Function Tests No results for input(s): AST, ALT, ALKPHOS, BILITOT, PROT, ALBUMIN in the last 72 hours. No results for input(s): LIPASE, AMYLASE in the last 72 hours. Cardiac Enzymes  Recent Labs  06/26/16 0250  06/26/16 1734 06/26/16 2142 06/27/16 0311  CKTOTAL 2,549*  < > 2,170* 1,820* 1,603*  TROPONINI 6.36*  --   --   --   --   < > = values in this interval not displayed.   Telemetry    Sinus tachycardia without significant ventricular ectopy. - Personally Reviewed  ECG  NSR with LVH and overall otherwise normal appearance..- Personally Reviewed  Radiology    No results found.  Cardiac Studies   ECHOCARDIOGRAM 06/23/16: Study  Conclusions  - Left ventricle: The cavity size was normal. Wall thickness was   increased in a pattern of moderate LVH. Systolic function was   normal. The estimated ejection fraction was in the range of 60%   to 65%. There is akinesis of the apicalanteroseptal and   inferolateral myocardium. Features are consistent with a   pseudonormal left ventricular filling pattern, with concomitant   abnormal relaxation and increased filling pressure (grade 2   diastolic dysfunction).  Impressions:  - Since last echo, apical wall motion abnormality is new.  Patient Profile     61 year old with vasculopathic history that now includes left lower extremity ischemic necrosis, postoperative acute stroke with speech difficulty, and simultaneous development of anterior ST elevation myocardial infarction during surgical thrombectomy of left lower extremity. Now s/p left AKA for occlusion of blood flow to left lower extremity.  Assessment & Plan    1. Acute anterior ST elevation infarction ECG has essentially normalized. Suspect spontaneous reperfusion or collateral recruitment. 2-D Doppler echocardiogram with small area of apical infarction. Since there is significant blood pressure elevation this morning we have room to further up titrate long-acting nitrate and beta blocker intensity. Plavix is been started. 2. 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 3. Left lower extremity above knee amputation. So far no acute cardiac problems post op. 4. Extreme blood pressure elevation. Increase intensification of beta blocker and long-acting nitrates. Have also decided to add low-dose amlodipine.  5. Alcohol abuse and at risk for development of withdrawal.  Okay to transfer to stepdown.  Signed, Lesleigh NoeHenry W Smith III, MD  06/27/2016, 12:56 PM

## 2016-06-27 NOTE — Care Management Note (Signed)
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  He states that there is no family member who can assist pt if he is discharged home, anticipates need for SNF for rehab.            Expected Discharge Plan:  Skilled Nursing Facility  In-House Referral:  Clinical Social Work  Discharge planning Services  CM Consult  Status of Service:  In process, will continue to follow  Magdalene RiverMayo, Martin[place] Pizzimenti T, RN 06/27/2016, 2:35 PM

## 2016-06-27 NOTE — Plan of Care (Signed)
Dr. Katrinka BlazingSmith notified of increased BP, agrees may go to 3S

## 2016-06-27 NOTE — Plan of Care (Signed)
Pt pulling at everything, trying to get up, resisting, restrained for pt safety, Dr. Randie Heinzain notified

## 2016-06-28 DIAGNOSIS — I952 Hypotension due to drugs: Secondary | ICD-10-CM

## 2016-06-28 DIAGNOSIS — L8990 Pressure ulcer of unspecified site, unspecified stage: Secondary | ICD-10-CM | POA: Insufficient documentation

## 2016-06-28 MED ORDER — HEPARIN SODIUM (PORCINE) 5000 UNIT/ML IJ SOLN
5000.0000 [IU] | Freq: Three times a day (TID) | INTRAMUSCULAR | Status: DC
  Administered 2016-06-28 – 2016-07-10 (×32): 5000 [IU] via SUBCUTANEOUS
  Filled 2016-06-28 (×31): qty 1

## 2016-06-28 MED ORDER — AMLODIPINE BESYLATE 2.5 MG PO TABS
2.5000 mg | ORAL_TABLET | Freq: Every day | ORAL | Status: DC
  Administered 2016-06-29: 2.5 mg via ORAL
  Filled 2016-06-28: qty 1

## 2016-06-28 MED ORDER — ISOSORBIDE MONONITRATE ER 60 MG PO TB24
60.0000 mg | ORAL_TABLET | Freq: Every day | ORAL | Status: DC
  Administered 2016-06-29 – 2016-07-09 (×11): 60 mg via ORAL
  Filled 2016-06-28 (×2): qty 1
  Filled 2016-06-28 (×3): qty 2
  Filled 2016-06-28 (×2): qty 1
  Filled 2016-06-28: qty 2
  Filled 2016-06-28 (×2): qty 1
  Filled 2016-06-28 (×2): qty 2

## 2016-06-28 NOTE — Progress Notes (Signed)
Patient Name: Stephen ApleyJerome D Bowen Date of Encounter: 06/28/2016  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 He is asleep. He is arousable but groggy.   S/P left AKA.  Inpatient Medications    Scheduled Meds: . amLODipine  2.5 mg Oral Daily  . aspirin EC  81 mg Oral Daily  . atorvastatin  80 mg Oral q1800  . clopidogrel  75 mg Oral Daily  . folic acid  1 mg Oral Daily  . heparin  5,000 Units Subcutaneous Q8H  . isosorbide mononitrate  90 mg Oral Daily  . LORazepam  0-4 mg Intravenous Q6H   Followed by  . [START ON 06/29/2016] LORazepam  0-4 mg Intravenous Q12H  . mouth rinse  15 mL Mouth Rinse BID  . metoprolol tartrate  50 mg Oral BID  . multivitamin with minerals  1 tablet Oral Daily  . pantoprazole  40 mg Oral Daily  . sodium chloride flush  3 mL Intravenous Q12H  . thiamine  100 mg Oral Daily   Or  . thiamine  100 mg Intravenous Daily   Continuous Infusions: . dexmedetomidine    . dexmedetomidine 0.1 mcg/kg/hr (06/28/16 1100)   PRN Meds: sodium chloride, acetaminophen **OR** acetaminophen, alum & mag hydroxide-simeth, bisacodyl, guaiFENesin-dextromethorphan, hydrALAZINE, HYDROmorphone (DILAUDID) injection, LORazepam **OR** LORazepam, metoprolol, morphine injection, ondansetron, oxyCODONE-acetaminophen, phenol, sodium chloride flush   Vital Signs    Vitals:   06/28/16 0800 06/28/16 0900 06/28/16 1000 06/28/16 1100  BP: (!) 159/93 124/80 127/87 (!) 118/96  Pulse: 86 92 88 96  Resp: 16 16 17 17   Temp:      TempSrc:      SpO2: 100% 100% 100% 100%  Weight:       Height:        Intake/Output Summary (Last 24 hours) at 06/28/16 1118 Last data filed at 06/28/16 1100  Gross per 24 hour  Intake           1520.1 ml  Output             1210 ml  Net            310.1 ml   Filed Weights   06/20/16 1027  Weight: 122 lb 5.7 oz (55.5 kg)    Physical Exam  Malnourished-appearing 61 year old gentleman appearing somewhat confused. GEN: Chronically ill and in no acute distress.  HEENT: Grossly normal.  Neck: Supple, with moderate JVD. No obvious carotid bruit is heard. Cardiac: A soft S4 gallop is audible. RRR, no murmurs, rubs. No clubbing, cyanosis, edema.   Extremities: left AKA Respiratory:  Respirations regular and unlabored, clear to auscultation bilaterally. GI: Soft, nontender, nondistended, BS + x 4. MS: s/p left AKA Skin: warm and dry, no rash. Neuro:  Strength decreased in left leg. Decreased alertness. Psych: AAOx0.  Detached Affect.  Labs    CBC  Recent Labs  06/26/16 1223 06/27/16 0311  WBC 15.1* 15.7*  HGB 10.2* 9.2*  HCT 29.8* 27.2*  MCV 90.0 88.6  PLT 326 398  Basic Metabolic Panel  Recent Labs  06/27/16 0311  NA 135  K 3.6  CL 102  CO2 24  GLUCOSE 103*  BUN 5*  CREATININE 0.78  CALCIUM 7.9*   Liver Function Tests No results for input(s): AST, ALT, ALKPHOS, BILITOT, PROT, ALBUMIN in the last 72 hours. No results for input(s): LIPASE, AMYLASE in the last 72 hours. Cardiac Enzymes  Recent Labs  06/26/16 0250  06/26/16 1734 06/26/16 2142 06/27/16 0311  CKTOTAL 2,549*  < > 2,170* 1,820* 1,603*  TROPONINI 6.36*  --   --   --   --   < > = values in this interval not displayed.   Telemetry    Sinus Rhythm with with premature ventricular beats. - Personally Reviewed  ECG  A new tracing is not performed today..- Personally Reviewed  Radiology    No results found.  Cardiac Studies   ECHOCARDIOGRAM 06/23/16: Study Conclusions  - Left ventricle: The cavity size was normal. Wall thickness was    increased in a pattern of moderate LVH. Systolic function was   normal. The estimated ejection fraction was in the range of 60%   to 65%. There is akinesis of the apicalanteroseptal and   inferolateral myocardium. Features are consistent with a   pseudonormal left ventricular filling pattern, with concomitant   abnormal relaxation and increased filling pressure (grade 2   diastolic dysfunction).  Impressions:  - Since last echo, apical wall motion abnormality is new.  Patient Profile     61 year old with vasculopathic history that now includes left lower extremity ischemic necrosis, postoperative acute stroke with speech difficulty, and simultaneous development of anterior ST elevation myocardial infarction during surgical thrombectomy of left lower extremity. Now s/p left AKA for occlusion of blood flow to left lower extremity.  Assessment & Plan    1. Acute anterior ST elevation infarction : No recurrence of ischemic complaints.  2. 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 3. Left lower extremity above knee amputation. So far no acute cardiac problems post op. 4. Extreme blood pressure elevation. There is marked improvement. May be over controlled currently. I will place whole orders on certain medications to avoid relative hypotension in the setting of recent CVA.  5. Alcohol abuse and at risk for development of withdrawal.  Okay to transfer to stepdown.  Signed, Lesleigh Noe, MD  06/28/2016, 11:18 AM

## 2016-06-28 NOTE — Progress Notes (Signed)
  Progress Note    06/28/2016 8:07 AM 3 Days Post-Op  Subjective:  No acute issues  Vitals:   06/28/16 0759 06/28/16 0800  BP:  (!) 159/93  Pulse:  86  Resp:  16  Temp: 97.3 F (36.3 C)     Physical Exam: Calm, disoriented Abdomen is soft Left aka site cdi with staples  CBC    Component Value Date/Time   WBC 15.7 (H) 06/27/2016 0311   RBC 3.07 (L) 06/27/2016 0311   HGB 9.2 (L) 06/27/2016 0311   HCT 27.2 (L) 06/27/2016 0311   PLT 398 06/27/2016 0311   MCV 88.6 06/27/2016 0311   MCH 30.0 06/27/2016 0311   MCHC 33.8 06/27/2016 0311   RDW 14.5 06/27/2016 0311   LYMPHSABS 1.1 02/13/2016 1519   MONOABS 0.6 02/13/2016 1519   EOSABS 0.0 02/13/2016 1519   BASOSABS 0.0 02/13/2016 1519    BMET    Component Value Date/Time   NA 135 06/27/2016 0311   K 3.6 06/27/2016 0311   CL 102 06/27/2016 0311   CO2 24 06/27/2016 0311   GLUCOSE 103 (H) 06/27/2016 0311   BUN 5 (L) 06/27/2016 0311   CREATININE 0.78 06/27/2016 0311   CREATININE 0.92 11/13/2013 1031   CALCIUM 7.9 (L) 06/27/2016 0311   GFRNONAA >60 06/27/2016 0311   GFRNONAA >89 11/13/2013 1031   GFRAA >60 06/27/2016 0311   GFRAA >89 11/13/2013 1031    INR    Component Value Date/Time   INR 1.07 06/25/2016 0925     Intake/Output Summary (Last 24 hours) at 06/28/16 0807 Last data filed at 06/28/16 0800  Gross per 24 hour  Intake          2137.08 ml  Output             1185 ml  Net           952.08 ml     Assessment:  61 y.o. 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 Randie Heinzain, MD [facility] Office: [phone] Pager: [phone]  06/28/2016 8:07 AM

## 2016-06-28 NOTE — Progress Notes (Addendum)
Occupational Therapy Treatment Patient Details Name: Stephen Bowen MRN: [id number] DOB: 1956-05-21 Today's Date: 06/28/2016    History of present illness 61 y.o. male who presented to [facility] for management of left ischemic foot secondary to occluded left femoral to posterior tibial bypass. He underwent thromboembolectomy of the bypass graft.  During the procedure the patient was having ST changes and cardiology was consulted. Pt with MI. The patient on awakening from anesthesia was aphasic and IP Code Stroke was called. CT 12/28 acute infarct involving a portion of the posterior mid to inferior right cerebellum as well as much of the left occipital lobe, particularly medially and mid portions. 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    OT comments  This 61 yo male admitted for above with complications as above presents to acute OT making progress with sit<>stand from bed and using proprioception to manipulate items in his hands. He will continue to benefit from acute OT with follow up OT on CIR to get back to a level to decrease burden of care. See PT note for vitals.  Follow Up Recommendations  CIR;Supervision/Assistance - 24 hour    Equipment Recommendations  Other (comment) (TBD at next venue)    Recommendations for Other Services Rehab consult    Precautions / Restrictions Precautions Precautions: Fall Precaution Comments: L AKA Restrictions Weight Bearing Restrictions: Yes LLE Weight Bearing: Non weight bearing       Mobility Bed Mobility Overal bed mobility: Needs Assistance Bed Mobility: Supine to Sit     Supine to sit: Max assist;+2 for physical assistance     General bed mobility comments: Pt assisted minimally with attempting to pull with assistance of therapist. Used bed pad to help move pt to EOB  Transfers Overall transfer level: Needs assistance   Transfers: Sit to/from Stand;Stand Pivot Transfers Sit to Stand: Mod assist;+2 physical assistance Stand pivot transfers: Total  assist;+2 physical assistance       General transfer comment: Pt resisting with transfer    Balance Overall balance assessment: Needs assistance Sitting-balance support: Bilateral upper extremity supported (RLE supported)   Sitting balance - Comments: Pt initially with posterior lean and right lateral lean. Sat EOB for ~18 minutes with max A -min guard A. Had him reach for end of bed rail with LUE and hold on to arm of recliner positioned left of his midline with his RUE to help him achieve midline posture--after doing this is when his balance improved to minguard A   Standing balance support: Bilateral upper extremity supported Standing balance-Leahy Scale: Poor Standing balance comment: Mod A +2                    ADL Overall ADL's : Needs assistance/impaired     Grooming: Minimal assistance Grooming Details (indicate cue type and reason): Once wash cloth placed in his right hand he attempted to readjust it with his left hand, then I VC him again to wash his face while I touched his face and he then followed through                 Toilet Transfer: Total assistance;+2 for physical assistance;Squat-pivot Toilet Transfer Details (indicate cue type and reason): bed>recliner going to pt's right                  Vision                 Additional Comments: When pt reaching to re-adust wash cloth with left hand he would  misjudge where it was but finally go it (feel he was using more proprioception than vision). He said he could see movement when I was shaking my hand in front him, but when I asked him touch it he did not reach for it.   Perception     Praxis      Cognition   Behavior During Therapy: Flat affect Overall Cognitive Status: Impaired/Different from baseline Area of Impairment: Orientation;Following commands;Safety/judgement;Problem solving Orientation Level: Place;Time;Situation      Following Commands: Follows one step commands  inconsistently Safety/Judgement: Decreased awareness of safety;Decreased awareness of deficits   Problem Solving: Slow processing;Decreased initiation;Difficulty sequencing;Requires verbal cues;Requires tactile cues                   Pertinent Vitals/ Pain       Pain Assessment: Faces Faces Pain Scale: Hurts even more Pain Location: LLE with movement Pain Descriptors / Indicators: Grimacing;Guarding Pain Intervention(s): Limited activity within patient's tolerance;Monitored during session;Repositioned         Frequency  Min 2X/week        Progress Toward Goals  OT Goals(current goals can now be found in the care plan section)  Progress towards OT goals: Progressing toward goals     Plan Discharge plan remains appropriate    Co-evaluation    PT/OT/SLP Co-Evaluation/Treatment: Yes Reason for Co-Treatment: Complexity of the patient's impairments (multi-system involvement)   OT goals addressed during session: Strengthening/ROM;ADL's and self-care      End of Session Equipment Utilized During Treatment: Gait belt   Activity Tolerance Patient tolerated treatment well   Patient Left in chair;with call bell/phone within reach;with chair alarm set;with restraints reapplied (posey belt)   Nurse Communication Mobility status (pt's IV came disconnected)        Time: [id number] OT Time Calculation (min): 44 min  Charges: OT General Charges $OT Visit: 1 Procedure OT Treatments $Therapeutic Activity: 23-37 mins  Evette GeorgesLeonard, Richards Pherigo Eva [phone] 06/28/2016, 11:03 AM

## 2016-06-28 NOTE — Progress Notes (Signed)
Speech Language Pathology Treatment: Dysphagia;Cognitive-Linquistic  Patient Details Name: Stephen Bowen MRN: [id number] DOB: 1956/04/01 Today's Date: 06/28/2016 Time: 4098-11911024-1038 SLP Time Calculation (min) (ACUTE ONLY): 14 min  Assessment / Plan / Recommendation Clinical Impression  Pt seen during breakfast meal with regular textures on his tray, but chopped and mashed to form softer consistencies. Despite food alterations, pt has significantly prolonged manipulation and bolus transit that requires Mod cues for clearance. Liquid washes seem most effective, and he seems to tolerate even large, consecutive straw sips without obvious aspiration. Recommend Dys 1 diet and thin liquids to facilitate oral phase and subsequently oral intake.   Pt is oriented to person, but cannot show orientation to place despite binary choice. He reponds "yes" to all basic, biographical yes/no questions. During meal he does verbalize selections when given a binary choice with Mod cues. Min-Mod cues given to follow one-step commands throughout functional task. Will continue to follow.   HPI HPI: Stephen ApleyJerome D Ponce is a 61 y.o. male, who presents with two week history of progressively worsening left foot pain. Per chart had left femoral to PT artery bypass with propaten on 02/16/16. PMH: tobacco abuse, HT, PAD. On 12/28 s/p thromboembolectomy patient was having ST changes and cardiology was consulted. The patient on awakening from anesthesia was aphasic and IP Code Stroke was called. Patient was not administered IV t-PA. Head CT 06/21/16 showed evidence of acute infarct involving a portion of the posterior mid to inferior right cerebellum as well as much of the left occipital lobe, particularly medially and mid portions. 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      SLP Plan  Continue with current plan of care     Recommendations  Diet recommendations: Dysphagia 1 (puree);Thin liquid Liquids provided via: Cup;Straw Medication  Administration: Whole meds with liquid Supervision: Staff to assist with self feeding Compensations: Minimize environmental distractions;Slow rate;Small sips/bites Postural Changes and/or Swallow Maneuvers: Seated upright 90 degrees                Oral Care Recommendations: Oral care BID Follow up Recommendations: Inpatient Rehab Plan: Continue with current plan of care       GO                Maxcine Hamaiewonsky, Isamar Wellbrock 06/28/2016, 10:54 AM  Maxcine HamLaura Paiewonsky, M.A. CCC-SLP [phone]

## 2016-06-28 NOTE — Progress Notes (Signed)
Physical Therapy Treatment Patient Details Name: Stephen ApleyJerome D Bowen MRN: [id number] DOB: Sep 03, 1955 Today's Date: 06/28/2016    History of Present Illness 61 y.o. male who [facility] for management of left ischemic foot secondary to occluded left femoral to posterior tibial bypass. He underwent thromboembolectomy of the bypass graft.  During the procedure the patient was having ST changes and cardiology was consulted. Pt with MI. The patient on awakening from anesthesia was aphasic and IP Code Stroke was called. CT 12/28 acute infarct involving a portion of the posterior mid to inferior right cerebellum as well as much of the left occipital lobe, particularly medially and mid portions. 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 PMHx: HTN, PAD    PT Comments    Pt pleasant, confused with no noted vision with proprioception used to reach for items during session. Pt unaware of situation, AKA and unable to correctly localize pain as stating his elbow hurts when painful movement of LLE. Pt with improved balance throughout session and able to stand x 2 today. Pt educated for transfers function and progression. Will continue to follow.   BP 159/87 pre 124/87 post HR 102 sats 100% on RA  Follow Up Recommendations  CIR     Equipment Recommendations       Recommendations for Other Services Rehab consult     Precautions / Restrictions Precautions Precautions: Fall Precaution Comments: L AKA, blind Restrictions Weight Bearing Restrictions: Yes LLE Weight Bearing: Non weight bearing    Mobility  Bed Mobility Overal bed mobility: Needs Assistance Bed Mobility: Supine to Sit     Supine to sit: Max assist;+2 for physical assistance     General bed mobility comments: Pt assisted minimally with attempting to pull with assistance of therapist. Used bed pad to help move pt to EOB  Transfers Overall transfer level: Needs assistance   Transfers: Sit to/from Stand;Stand Pivot Transfers Sit to Stand: Mod  assist;+2 physical assistance Stand pivot transfers: Total assist;+2 physical assistance       General transfer comment: assist to rise from surface with bil UE supported on therapists x 2 trials to stand from bed grossly 15 sec each trial. Pt resisting with transfer during pivot with physical assist to rotate pelvis toward chair. Max +2 to scoot in chair   Ambulation/Gait                 Stairs            Wheelchair Mobility    Modified Rankin (Stroke Patients Only)       Balance Overall balance assessment: Needs assistance Sitting-balance support: Bilateral upper extremity supported Sitting balance-Leahy Scale: Poor Sitting balance - Comments: Pt initially with posterior lean and right lateral lean. Sat EOB for ~18 minutes with max A -min guard A. Had him reach for end of bed rail with LUE and hold on to arm of recliner positioned left of his midline with his RUE to help him achieve midline posture--after doing this is when his balance improved to minguard A   Standing balance support: Bilateral upper extremity supported Standing balance-Leahy Scale: Poor Standing balance comment: Mod A +2                     Cognition Arousal/Alertness: Awake/alert Behavior During Therapy: Flat affect Overall Cognitive Status: Impaired/Different from baseline Area of Impairment: Orientation;Following commands;Safety/judgement;Problem solving Orientation Level: Place;Time;Situation     Following Commands: Follows one step commands inconsistently Safety/Judgement: Decreased awareness of safety;Decreased awareness of deficits  Problem Solving: Slow processing;Decreased initiation;Difficulty sequencing;Requires verbal cues;Requires tactile cues      Exercises      General Comments        Pertinent Vitals/Pain Pain Assessment: Faces Pain Score: 4  Faces Pain Scale: Hurts even more Pain Location: LLE with movement Pain Descriptors / Indicators:  Grimacing;Guarding Pain Intervention(s): Limited activity within patient's tolerance;Monitored during session;Repositioned    Home Living                      Prior Function            PT Goals (current goals can now be found in the care plan section) Progress towards PT goals: Progressing toward goals    Frequency           PT Plan Current plan remains appropriate    Co-evaluation PT/OT/SLP Co-Evaluation/Treatment: Yes Reason for Co-Treatment: Complexity of the patient's impairments (multi-system involvement);For patient/therapist safety PT goals addressed during session: Mobility/safety with mobility;Balance OT goals addressed during session: Strengthening/ROM;ADL's and self-care     End of Session Equipment Utilized During Treatment: Gait belt Activity Tolerance: Patient limited by pain Patient left: in chair;with call bell/phone within reach;with chair alarm set;with restraints reapplied;with nursing/sitter in room     Time: 0810-0854 PT Time Calculation (min) (ACUTE ONLY): 44 min  Charges:  $Therapeutic Activity: 8-22 mins                    G Codes:      Orvell Careaga B Neymar Dowe 06-Jul-2016, 11:43 AM  Delaney Meigs, PT [phone]

## 2016-06-28 NOTE — Care Management Note (Signed)
Case Management Note  Patient Details  Name: Stephen Bowen MRN: [id number] Date of Birth: [birth year]  Subjective/Objective:  S/p AKA, Femoral-Tibial Bypass Graft Occulusion, Stroke                 Action/Plan: Discharge Planning: NCM spoke to pt's brother, Stephen Bowen [phone] cell, [phone]. States he wants brother to go to a SNF-rehab. States pt was living with an elderly Uncle helping him at home. CSW referral for SNF placement. Contacted Artistinancial Counselor to assist with Medicaid and disability.    Expected Discharge Date:                 Expected Discharge Plan:  Skilled Nursing Facility  In-House Referral:  Clinical Social Work, Museum/gallery exhibitions officerinancial Counselor  Discharge planning Services  CM Consult  Post Acute Care Choice:  NA Choice offered to:  NA  DME Arranged:  N/A DME Agency:  NA  HH Arranged:  NA HH Agency:  NA  Status of Service:  In process, will continue to follow  If discussed at Long Length of Stay Meetings, dates discussed:    Additional Comments:  Elliot CousinShavis, Ala Kratz Ellen, RN 06/28/2016, 12:11 PM

## 2016-06-29 ENCOUNTER — Encounter: Payer: Self-pay | Admitting: Vascular Surgery

## 2016-06-29 LAB — CBC
HCT: 33.4 % — ABNORMAL LOW (ref 39.0–52.0)
Hemoglobin: 11 g/dL — ABNORMAL LOW (ref 13.0–17.0)
MCH: 30.1 pg (ref 26.0–34.0)
MCHC: 32.9 g/dL (ref 30.0–36.0)
MCV: 91.5 fL (ref 78.0–100.0)
PLATELETS: 766 10*3/uL — AB (ref 150–400)
RBC: 3.65 MIL/uL — ABNORMAL LOW (ref 4.22–5.81)
RDW: 15.3 % (ref 11.5–15.5)
WBC: 14.8 10*3/uL — ABNORMAL HIGH (ref 4.0–10.5)

## 2016-06-29 LAB — BASIC METABOLIC PANEL
Anion gap: 13 (ref 5–15)
CHLORIDE: 98 mmol/L — AB (ref 101–111)
CO2: 26 mmol/L (ref 22–32)
Calcium: 8.7 mg/dL — ABNORMAL LOW (ref 8.9–10.3)
Creatinine, Ser: 0.86 mg/dL (ref 0.61–1.24)
Glucose, Bld: 99 mg/dL (ref 65–99)
Potassium: 2.8 mmol/L — ABNORMAL LOW (ref 3.5–5.1)
SODIUM: 137 mmol/L (ref 135–145)

## 2016-06-29 LAB — CK ISOENZYMES
CK MB: 0 % (ref 0–3)
CK MM: 100 % (ref 97–100)
CK-BB: 0 %
CREATINE KINASE-TOTAL: 5179 U/L — AB (ref 24–204)
MACRO TYPE 1: 0 %
MACRO TYPE 2: 0 %

## 2016-06-29 MED ORDER — HYDRALAZINE HCL 20 MG/ML IJ SOLN
10.0000 mg | INTRAMUSCULAR | Status: DC | PRN
  Administered 2016-06-29: 10 mg via INTRAVENOUS
  Administered 2016-06-29 – 2016-06-30 (×4): 20 mg via INTRAVENOUS
  Filled 2016-06-29 (×5): qty 1

## 2016-06-29 MED ORDER — AMLODIPINE BESYLATE 5 MG PO TABS
5.0000 mg | ORAL_TABLET | Freq: Every day | ORAL | Status: DC
  Administered 2016-06-30 – 2016-07-03 (×4): 5 mg via ORAL
  Filled 2016-06-29 (×4): qty 1

## 2016-06-29 MED ORDER — POTASSIUM CHLORIDE CRYS ER 20 MEQ PO TBCR
60.0000 meq | EXTENDED_RELEASE_TABLET | Freq: Once | ORAL | Status: AC
  Administered 2016-06-29: 60 meq via ORAL
  Filled 2016-06-29: qty 3

## 2016-06-29 MED ORDER — BISACODYL 10 MG RE SUPP: 10.0000 mg | Freq: Every day | RECTAL | Status: DC | PRN

## 2016-06-29 MED ORDER — METOPROLOL TARTRATE 25 MG PO TABS
75.0000 mg | ORAL_TABLET | Freq: Two times a day (BID) | ORAL | Status: DC
  Administered 2016-06-29 – 2016-06-30 (×2): 75 mg via ORAL
  Filled 2016-06-29 (×2): qty 1

## 2016-06-29 MED ORDER — FLEET ENEMA 7-19 GM/118ML RE ENEM
1.0000 | ENEMA | Freq: Every day | RECTAL | Status: DC | PRN
  Filled 2016-06-29: qty 1

## 2016-06-29 MED ORDER — POLYETHYLENE GLYCOL 3350 17 G PO PACK
17.0000 g | PACK | Freq: Every day | ORAL | Status: DC
  Administered 2016-06-29 – 2016-07-10 (×10): 17 g via ORAL
  Filled 2016-06-29 (×12): qty 1

## 2016-06-29 NOTE — [place] FL2 (Signed)
[place] MEDICAID FL2 LEVEL OF CARE SCREENING TOOL     IDENTIFICATION  Patient Name: Stephen ApleyJerome D Bowen Birthdate: [birth year] Sex: male Admission Date (Current Location): 06/19/2016  [facility] [place]   Facility and Address:  [facility] [place], [address], [place], [place] [address]      Provider Number: [id number]  Attending Physician Name and Address:  Maeola HarmanBrandon Christopher Cain*  Relative Name and Phone Number:  Allyson SabalAlford Betten    Current Level of Care: Hospital Recommended Level of Care: Skilled Nursing Facility Prior Approval Number:    Date Approved/Denied:   PASRR Number:    Discharge Plan: SNF    Current Diagnoses: Patient Active Problem List   Diagnosis Date Noted  . Pressure injury of skin 06/28/2016  . 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 Aphasia   . Hypotension 06/21/2016  . Stroke (HCC) 06/21/2016  . Acute MI, anterolateral wall, initial episode of care (HCC)   . 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 Preoperative cardiovascular examination   . PAD (peripheral artery disease) (HCC) 02/13/2016  . Leg pain 02/13/2016  . Hyperkalemia 02/13/2016  . Lactic acid increased 02/13/2016  . Essential hypertension 02/13/2016  . Seborrheic dermatitis 08/31/2015  . Actinic keratosis of multiple sites of head and neck 08/31/2015  . Tobacco use disorder 08/31/2015  . Skin ulcer of scalp (HCC) 08/31/2015  . Atherosclerosis of native arteries of extremity with intermittent claudication (HCC) 07/07/2015  . Abnormal EKG 11/13/2013  . Hypertensive urgency 11/13/2013    Orientation RESPIRATION BLADDER Height & Weight     Self  Normal Continent Weight: 122 lb 5.7 oz (55.5 kg) Height:  5\' 8"  (172.7 cm)  BEHAVIORAL SYMPTOMS/MOOD NEUROLOGICAL BOWEL NUTRITION STATUS      Continent  (DSY1)  AMBULATORY STATUS COMMUNICATION OF NEEDS Skin   Extensive  Assist Verbally Normal                       Personal Care Assistance Level of Assistance  Bathing, Feeding, Dressing Bathing Assistance: Limited assistance Feeding assistance: Independent Dressing Assistance: Limited assistance     Functional Limitations Info  Sight, Hearing, Speech Sight Info: Adequate Hearing Info: Adequate Speech Info: Adequate    SPECIAL CARE FACTORS FREQUENCY  PT (By licensed PT), OT (By licensed OT)     PT Frequency: 5x week OT Frequency: 5x week            Contractures Contractures Info: Not present    Additional Factors Info  Code Status Code Status Info: Full Code             Current Medications (06/29/2016):  This is the current hospital active medication list Current Facility-Administered Medications  Medication Dose Route Frequency Provider Last Rate Last Dose  . 0.9 %  sodium chloride infusion  250 mL Intravenous PRN Maeola HarmanBrandon Christopher Cain, MD      . acetaminophen (TYLENOL) tablet 325-650 mg  325-650 mg Oral Q4H PRN Raymond GurneyKimberly A Trinh, PA-C       Or  . acetaminophen (TYLENOL) suppository 325-650 mg  325-650 mg Rectal Q4H PRN Raymond GurneyKimberly A Trinh, PA-C      . alum & mag hydroxide-simeth (MAALOX/MYLANTA) 200-200-20 MG/5ML suspension 15-30 mL  15-30 mL Oral Q2H PRN Raymond GurneyKimberly A Trinh, PA-C      . amLODipine (NORVASC) tablet 2.5 mg  2.5 mg Oral Daily Sherilyn CooterHenry  Malissa Hippo, MD   2.5 mg at 06/29/16 0906  . aspirin EC tablet 81 mg  81 mg Oral Daily Lyn Records, MD   81 mg at 06/29/16 1610  . atorvastatin (LIPITOR) tablet 80 mg  80 mg Oral q1800 Lyn Records, MD   80 mg at 06/28/16 1630  . bisacodyl (DULCOLAX) EC tablet 5 mg  5 mg Oral Daily PRN Lars Mage, PA-C   5 mg at 06/29/16 9604  . bisacodyl (DULCOLAX) suppository 10 mg  10 mg Rectal Daily PRN Raymond Gurney, PA-C      . clopidogrel (PLAVIX) tablet 75 mg  75 mg Oral Daily Maeola Harman, MD   75 mg at 06/29/16 0913  . folic acid (FOLVITE) tablet 1 mg  1 mg Oral Daily Lars Mage, PA-C   1 mg at 06/29/16 5409  . guaiFENesin-dextromethorphan (ROBITUSSIN DM) 100-10 MG/5ML syrup 15 mL  15 mL Oral Q4H PRN Raymond Gurney, PA-C      . heparin injection 5,000 Units  5,000 Units Subcutaneous Q8H Maeola Harman, MD   5,000 Units at 06/29/16 1315  . hydrALAZINE (APRESOLINE) injection 10-20 mg  10-20 mg Intravenous Q4H PRN Maeola Harman, MD   20 mg at 06/29/16 0829  . HYDROmorphone (DILAUDID) injection 1 mg  1 mg Intravenous Q2H PRN Maeola Harman, MD   1 mg at 06/29/16 0829  . isosorbide mononitrate (IMDUR) 24 hr tablet 60 mg  60 mg Oral Daily Lyn Records, MD   60 mg at 06/29/16 0906  . LORazepam (ATIVAN) injection 0-4 mg  0-4 mg Intravenous Q12H Lars Mage, PA-C      . LORazepam (ATIVAN) tablet 1 mg  1 mg Oral Q6H PRN Lars Mage, PA-C   1 mg at 06/27/16 1329   Or  . LORazepam (ATIVAN) injection 1 mg  1 mg Intravenous Q6H PRN Lars Mage, PA-C   1 mg at 06/29/16 0219  . MEDLINE mouth rinse  15 mL Mouth Rinse BID Maeola Harman, MD   15 mL at 06/29/16 0906  . metoprolol (LOPRESSOR) tablet 50 mg  50 mg Oral BID Lyn Records, MD   50 mg at 06/29/16 0906  . morphine 4 MG/ML injection 2-5 mg  2-5 mg Intravenous Q1H PRN Raymond Gurney, PA-C   4 mg at 06/25/16 2349  . multivitamin with minerals tablet 1 tablet  1 tablet Oral Daily Lars Mage, PA-C   1 tablet at 06/29/16 8119  . ondansetron (ZOFRAN) injection 4 mg  4 mg Intravenous Q6H PRN Raymond Gurney, PA-C   4 mg at 06/21/16 1258  . oxyCODONE-acetaminophen (PERCOCET/ROXICET) 5-325 MG per tablet 1-2 tablet  1-2 tablet Oral Q4H PRN Raymond Gurney, PA-C   2 tablet at 06/29/16 1349  . pantoprazole (PROTONIX) EC tablet 40 mg  40 mg Oral Daily Raymond Gurney, PA-C   40 mg at 06/29/16 0906  . phenol (CHLORASEPTIC) mouth spray 1 spray  1 spray Mouth/Throat PRN Raymond Gurney, PA-C      . polyethylene glycol (MIRALAX / GLYCOLAX) packet 17 g  17 g Oral Daily Raymond Gurney, PA-C   17 g at 06/29/16 0905  . sodium chloride flush (NS) 0.9 % injection 3 mL  3 mL Intravenous Q12H Maeola Harman, MD   3 mL at 06/29/16 0907  . sodium chloride flush (NS) 0.9 % injection 3 mL  3 mL Intravenous  PRN Maeola Harman, MD      . sodium phosphate (FLEET) 7-19 GM/118ML enema 1 enema  1 enema Rectal Daily PRN Raymond Gurney, PA-C      . thiamine (VITAMIN B-1) tablet 100 mg  100 mg Oral Daily Lars Mage, PA-C   100 mg at 06/29/16 1610     Discharge Medications: Please see discharge summary for a list of discharge medications.  Relevant Imaging Results:  Relevant Lab Results:   Additional Information SS# [id number]  Althea Charon, LCSW

## 2016-06-29 NOTE — Progress Notes (Signed)
PT Cancellation Note  Patient Details Name: Stephen ApleyJerome D Bowen MRN: [id number] DOB: 10-22-1955   Cancelled Treatment:    Reason Eval/Treat Not Completed: Medical issues which prohibited therapy (BP currently 195/116 and HR 130 at rest. RN aware and will attempt to see later if time and medical stability allow)   Hazem Kenner B Lorra Freeman 06/29/2016, 8:40 AM  Delaney MeigsMaija Tabor Brinsley Wence, PT [phone]

## 2016-06-29 NOTE — Progress Notes (Signed)
  Progress Note    06/29/2016 8:48 AM 4 Days Post-Op  Subjective:  htn overnight, otherwise stable off precedex  Vitals:   06/29/16 0805 06/29/16 0845  BP:  (!) 162/102  Pulse:  (!) 113  Resp:  17  Temp: 97.5 F (36.4 C)     Physical Exam: Disoriented Hd stable with sinus tach Left aka site cdi with staples  CBC    Component Value Date/Time   WBC 15.7 (H) 06/27/2016 0311   RBC 3.07 (L) 06/27/2016 0311   HGB 9.2 (L) 06/27/2016 0311   HCT 27.2 (L) 06/27/2016 0311   PLT 398 06/27/2016 0311   MCV 88.6 06/27/2016 0311   MCH 30.0 06/27/2016 0311   MCHC 33.8 06/27/2016 0311   RDW 14.5 06/27/2016 0311   LYMPHSABS 1.1 02/13/2016 1519   MONOABS 0.6 02/13/2016 1519   EOSABS 0.0 02/13/2016 1519   BASOSABS 0.0 02/13/2016 1519    BMET    Component Value Date/Time   NA 135 06/27/2016 0311   K 3.6 06/27/2016 0311   CL 102 06/27/2016 0311   CO2 24 06/27/2016 0311   GLUCOSE 103 (H) 06/27/2016 0311   BUN 5 (L) 06/27/2016 0311   CREATININE 0.78 06/27/2016 0311   CREATININE 0.92 11/13/2013 1031   CALCIUM 7.9 (L) 06/27/2016 0311   GFRNONAA >60 06/27/2016 0311   GFRNONAA >89 11/13/2013 1031   GFRAA >60 06/27/2016 0311   GFRAA >89 11/13/2013 1031    INR    Component Value Date/Time   INR 1.07 06/25/2016 0925     Intake/Output Summary (Last 24 hours) at 06/29/16 0848 Last data filed at 06/29/16 0600  Gross per 24 hour  Intake           493.37 ml  Output             2660 ml  Net         -2166.63 ml     Assessment:  61 y.o. male is s/p left aka, posterior circulation embolic cva and stemi during initial surgery.  Plan: -transfer out of icu now off precedex -asa and plavix -will need snf at discharge -following much appreciated cardiology recs re: htn and possible need for coronary evaluation -DVT prophylaxis:  subq heparin   Wyatt Thorstenson C. Randie Heinzain, MD [facility] Office: [phone] Pager: [phone]  06/29/2016 8:48 AM

## 2016-06-29 NOTE — Clinical Social Work Placement (Signed)
   CLINICAL SOCIAL WORK PLACEMENT  NOTE  Date:  06/29/2016  Patient Details  Name: Stephen Bowen MRN: [id number] Date of Birth: [birth year]  Clinical Social Work is seeking post-discharge placement for this patient at the Skilled  Nursing Facility level of care (*CSW will initial, date and re-position this form in  chart as items are completed):  Yes   Patient/family provided with [place] Clinical Social Work Department's list of facilities offering this level of care within the geographic area requested by the patient (or if unable, by the patient's family).  Yes   Patient/family informed of their freedom to choose among providers that offer the needed level of care, that participate in Medicare, Medicaid or managed care program needed by the patient, have an available bed and are willing to accept the patient.  Yes   Patient/family informed of [facility]'s ownership interest in [facility] and [facility], as well as of the fact that they are under no obligation to receive care at these facilities.  PASRR submitted to EDS on       PASRR number received on       Existing PASRR number confirmed on       FL2 transmitted to all facilities in geographic area requested by pt/family on       FL2 transmitted to all facilities within larger geographic area on       Patient informed that his/her managed care company has contracts with or will negotiate with certain facilities, including the following:            Patient/family informed of bed offers received.  Patient chooses bed at       Physician recommends and patient chooses bed at      Patient to be transferred to   on  .  Patient to be transferred to facility by       Patient family notified on   of transfer.  Name of family member notified:        PHYSICIAN Please sign FL2     Additional Comment:    _______________________________________________ Althea CharonAshley C Azhia Siefken, LCSW 06/29/2016, 4:37 PM

## 2016-06-29 NOTE — Clinical Social Work Note (Signed)
Clinical Social Work Assessment  Patient Details  Name: Stephen Bowen MRN: [id number] Date of Birth: 1955/08/04  Date of referral:  06/29/16               Reason for consult:  Discharge Planning                Permission sought to share information with:  Family Supports Permission granted to share information::  Yes, Verbal Permission Granted  Name::     Pope Brunty  Agency::     Relationship::  brother  Contact Information:  [phone]  Housing/Transportation Living arrangements for the past 2 months:  Single Family Home Source of Information:  Other (Comment Required) (Brother) Patient Interpreter Needed:  None Criminal Activity/Legal Involvement Pertinent to Current Situation/Hospitalization:  No - Comment as needed Significant Relationships:  Other Family Members, Siblings Lives with:  Relatives, Self (Lives with uncle) Do you feel safe going back to the place where you live?  Yes Need for family participation in patient care:  Yes (Comment)  Care giving concerns:  Patient has supportive siblings   Facilities manager / plan:  Clinical Social Worker met patient at discharge to offer support and discuss patients discharge needs. CSW was unable to do assessment with patient due to patient being unaware of place,time and surroundings. CSW contacted patients oldest brother Vivia Birmingham and did assessment with brother via phone. Vivia Birmingham stated that patient was living with their uncle has the uncle's caregiver but he can no longer do that. [place] asked for assistance on how to apply for medicaid for patient. CSW gave [facility] information and encouraged brother to contact DSS and make appointment to do paper work. Vivia Birmingham is agreeable for patient to discharge to SNF and would prefer [facility]. CSW to complete necessary paperwork and initiate SNF search on patients behalf. CSW remains available for support and to facilitate patient discharge needs once medically stable.   Employment  status:  Retired Forensic scientist:  Other (Comment Required) (no insurance) PT Recommendations:  [place] / Referral to community resources:  [place]  Patient/Family's Response to care:  Patients brother verbalized appreciation and understanding for CSW role and involvement in care. Patient agreeable with current discharge plant to SNF after discharge.  Patient/Family's Understanding of and Emotional Response to Diagnosis, Current Treatment, and Prognosis:  Patient with good understanding of current medical state and limitations around most recent hospitalization. Family agreeable with SNF in hopes of transitioning to a more stable environment.   Emotional Assessment Appearance:  Appears stated age Attitude/Demeanor/Rapport:  Unable to Assess Affect (typically observed):  Unable to Assess Orientation:  Oriented to Self Alcohol / Substance use:    Psych involvement (Current and /or in the community):     Discharge Needs  Concerns to be addressed:  Financial / Insurance Concerns, Discharge Planning Concerns Readmission within the last 30 days:  No Current discharge risk:  None Barriers to Discharge:  No Barriers Identified   Rhea Pink, MSW,  [place]

## 2016-06-29 NOTE — Progress Notes (Signed)
Patient Name: Stephen Bowen Date of Encounter: 06/29/2016  Primary Cardiologist: 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 Easily arousable. No complaints.  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  Inpatient Medications    Scheduled Meds: . amLODipine  2.5 mg Oral Daily  . aspirin EC  81 mg Oral Daily  . atorvastatin  80 mg Oral q1800  . clopidogrel  75 mg Oral Daily  . folic acid  1 mg Oral Daily  . heparin  5,000 Units Subcutaneous Q8H  . isosorbide mononitrate  60 mg Oral Daily  . LORazepam  0-4 mg Intravenous Q12H  . mouth rinse  15 mL Mouth Rinse BID  . metoprolol tartrate  50 mg Oral BID  . multivitamin with minerals  1 tablet Oral Daily  . pantoprazole  40 mg Oral Daily  . polyethylene glycol  17 g Oral Daily  . sodium chloride flush  3 mL Intravenous Q12H  . thiamine  100 mg Oral Daily   Continuous Infusions:  PRN Meds: sodium chloride, acetaminophen **OR** acetaminophen, alum & mag hydroxide-simeth, bisacodyl, bisacodyl, guaiFENesin-dextromethorphan, hydrALAZINE, HYDROmorphone (DILAUDID) injection, LORazepam **OR** LORazepam, morphine injection, ondansetron, oxyCODONE-acetaminophen, phenol, sodium chloride flush, sodium phosphate   Vital Signs    Vitals:   06/29/16 1100 06/29/16 1200 06/29/16 1205 06/29/16 1300  BP: (!) 140/97 (!) 160/96    Pulse: 86 85  93  Resp: 14 14    Temp:   97.6 F (36.4 C)   TempSrc:   Oral   SpO2: 100% 100%  96%  Weight:      Height:        Intake/Output Summary (Last 24 hours) at 06/29/16 1749 Last data filed at 06/29/16 1200  Gross per 24 hour  Intake              360 ml  Output             2525 ml  Net            -2165 ml   Filed Weights   06/20/16 1027  Weight: 122 lb 5.7 oz (55.5 kg)    Physical Exam  Malnourished-appearing 61 year old gentleman appearing somewhat confused. GEN: Chronically ill and in no acute distress.  HEENT: Grossly normal.  Neck: Supple, with moderate JVD. No obvious carotid bruit is heard. Cardiac: A soft S4 gallop is audible. RRR, no murmurs, rubs. No clubbing, cyanosis, edema.   Extremities: left AKA Respiratory:  Respirations regular and unlabored, clear to auscultation bilaterally. GI: Soft, nontender, nondistended, BS + x 4. MS: s/p left AKA Skin: warm and dry, no rash. Neuro:  Strength decreased in left leg. Decreased alertness. Psych: AAOx0.  Detached Affect.  Labs    CBC  Recent Labs  06/27/16 0311 06/29/16 0908  WBC 15.7* 14.8*  HGB 9.2* 11.0*  HCT 27.2* 33.4*  MCV 88.6 91.5  PLT 398 766*   Basic Metabolic Panel  Recent Labs  06/27/16 0311 06/29/16 0908  NA 135 137  K 3.6 2.8*  CL  102 98*  CO2 24 26  GLUCOSE 103* 99  BUN 5* <5*  CREATININE 0.78 0.86  CALCIUM 7.9* 8.7*   Liver Function Tests No results for input(s): AST, ALT, ALKPHOS, BILITOT, PROT, ALBUMIN in the last 72 hours. No results for input(s): LIPASE, AMYLASE in the last 72 hours. Cardiac Enzymes  Recent Labs  06/26/16 2142 06/27/16 0311  CKTOTAL 1,820* 1,603*     Telemetry    Sinus Rhythm with with premature ventricular beats. - Personally Reviewed  ECG  A new tracing is not performed today..- Personally Reviewed  Radiology    No results found.  Cardiac Studies   ECHOCARDIOGRAM 06/23/16: Study Conclusions  - Left ventricle: The cavity size was normal. Wall thickness was   increased in a pattern of moderate LVH. Systolic function was   normal. The estimated ejection fraction was in the range of 60%   to 65%. There is akinesis of the apicalanteroseptal and    inferolateral myocardium. Features are consistent with a   pseudonormal left ventricular filling pattern, with concomitant   abnormal relaxation and increased filling pressure (grade 2   diastolic dysfunction).  Impressions:  - Since last echo, apical wall motion abnormality is new.  Patient Profile     61 year old with vasculopathic history that now includes left lower extremity ischemic necrosis, postoperative acute stroke with speech difficulty, and simultaneous development of anterior ST elevation myocardial infarction during surgical thrombectomy of left lower extremity. Now s/p left AKA for occlusion of blood flow to left lower extremity.  Assessment & Plan    1. Acute anterior ST elevation infarction : No recurrence of ischemic complaints. 2. 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 3. Left lower extremity above knee amputation. So far no acute cardiac problems post op. 4. Labile blood pressure elevation. I have intensified antihypertensive therapy by increasing metoprolol to 75 mg twice a day and increased amlodipine to 5 mg per day.  Okay to transfer to stepdown.  Signed, Lesleigh Noe, MD  06/29/2016, 5:49 PM

## 2016-06-29 NOTE — Progress Notes (Signed)
Dr. Randie Heinzain paged and spoke with regarding HTN. Orders received. Will continue to monitor closely. Modena JanskyKevin Suhayb Anzalone RN [facility]

## 2016-06-29 NOTE — Plan of Care (Signed)
Problem: Education: Goal: Knowledge of secondary prevention will improve Outcome: Not Progressing Pt confused Goal: Knowledge of patient specific risk factors addressed and post discharge goals established will improve Outcome: Not Progressing See previous  Problem: Coping: Goal: Ability to verbalize positive feelings about self will improve Outcome: Not Progressing Expressive aphasia  Problem: Nutrition: Goal: Risk of aspiration will decrease Outcome: Progressing Dysphagia 1 diet w/ assistance  Problem: Tissue Perfusion: Goal: Complications of Ischemic Stroke will be minimized (choose ONE based on patient diagnosis) Outcome: Progressing Pt becoming less agitated

## 2016-06-30 LAB — BASIC METABOLIC PANEL
ANION GAP: 14 (ref 5–15)
BUN: 6 mg/dL (ref 6–20)
CHLORIDE: 98 mmol/L — AB (ref 101–111)
CO2: 23 mmol/L (ref 22–32)
Calcium: 8.4 mg/dL — ABNORMAL LOW (ref 8.9–10.3)
Creatinine, Ser: 0.78 mg/dL (ref 0.61–1.24)
Glucose, Bld: 114 mg/dL — ABNORMAL HIGH (ref 65–99)
POTASSIUM: 3.6 mmol/L (ref 3.5–5.1)
SODIUM: 135 mmol/L (ref 135–145)

## 2016-06-30 LAB — TSH: TSH: 1.162 u[IU]/mL (ref 0.350–4.500)

## 2016-06-30 MED ORDER — METOPROLOL TARTRATE 50 MG PO TABS
100.0000 mg | ORAL_TABLET | Freq: Two times a day (BID) | ORAL | Status: DC
  Administered 2016-06-30 – 2016-07-01 (×2): 100 mg via ORAL
  Filled 2016-06-30 (×2): qty 2

## 2016-06-30 NOTE — Progress Notes (Signed)
Patient Name: Stephen Bowen Date of Encounter: 06/30/2016  Primary Cardiologist: C. 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 Easily arousable. No complaints.  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  Inpatient Medications    Scheduled Meds: . amLODipine  5 mg Oral Daily  . aspirin EC  81 mg Oral Daily  . atorvastatin  80 mg Oral q1800  . clopidogrel  75 mg Oral Daily  . folic acid  1 mg Oral Daily  . heparin  5,000 Units Subcutaneous Q8H  . isosorbide mononitrate  60 mg Oral Daily  . LORazepam  0-4 mg Intravenous Q12H  . mouth rinse  15 mL Mouth Rinse BID  . metoprolol tartrate  75 mg Oral BID  . multivitamin with minerals  1 tablet Oral Daily  . pantoprazole  40 mg Oral Daily  . polyethylene glycol  17 g Oral Daily  . sodium chloride flush  3 mL Intravenous Q12H  . thiamine  100 mg Oral Daily   Continuous Infusions:  PRN Meds: sodium chloride, acetaminophen **OR** acetaminophen, alum & mag hydroxide-simeth, bisacodyl, bisacodyl, guaiFENesin-dextromethorphan, hydrALAZINE, HYDROmorphone (DILAUDID) injection, morphine injection, ondansetron, oxyCODONE-acetaminophen, phenol, sodium chloride flush, sodium phosphate   Vital Signs    Vitals:   06/30/16 0800 06/30/16 0900 06/30/16 1000 06/30/16 1100  BP: (!) 182/102   (!) 172/109  Pulse: (!) 110 (!) 145 (!) 109 (!) 115  Resp: (!) 21   17  Temp:      TempSrc:      SpO2: 100% 100% 100% 100%  Weight:      Height:        Intake/Output Summary (Last 24 hours) at 06/30/16 1139 Last data filed at 06/30/16 1000  Gross per 24 hour    Intake              240 ml  Output             1875 ml  Net            -1635 ml   Filed Weights   06/20/16 1027  Weight: 122 lb 5.7 oz (55.5 kg)    Physical Exam  Malnourished-appearing 61 year old gentleman appearing somewhat confused. GEN: Chronically ill and in no acute distress.  HEENT: Grossly normal.  Neck: Supple, with moderate JVD. No obvious carotid bruit is heard. Cardiac: A soft S4 gallop is audible. RRR, no murmurs, rubs. No clubbing, cyanosis, edema.  tachycardic Extremities: left AKA Respiratory:  Respirations regular and unlabored, clear to auscultation bilaterally. GI: Soft, nontender, nondistended, BS + x 4. MS: s/p left AKA Skin: warm and dry, no rash. Neuro:  Strength decreased in left leg. Decreased alertness. Psych: AAOx0.  Detached Affect.  Labs    CBC  Recent Labs  06/29/16 0908  WBC 14.8*  HGB 11.0*  HCT 33.4*  MCV 91.5  PLT 766*   Basic Metabolic Panel  Recent Labs  06/29/16 0908  NA 137  K 2.8*  CL 98*  CO2 26  GLUCOSE 99  BUN <5*  CREATININE 0.86  CALCIUM 8.7*   Liver Function Tests No results for input(s): AST, ALT, ALKPHOS, BILITOT, PROT, ALBUMIN in the last 72 hours. No results for input(s): LIPASE, AMYLASE in the last 72 hours. Cardiac Enzymes No results for input(s): CKTOTAL, CKMB, CKMBINDEX, TROPONINI in the last 72 hours.   Telemetry    Sinus Rhythm with with premature ventricular beats. - Personally Reviewed  ECG  A new tracing is not performed today..- Personally Reviewed  Radiology    No results found.  Cardiac Studies   ECHOCARDIOGRAM 06/23/16: Study Conclusions  - Left ventricle: The cavity size was normal. Wall thickness was   increased in a pattern of moderate LVH. Systolic function was   normal. The estimated ejection fraction was in the range of 60%   to 65%. There is akinesis of the apicalanteroseptal and   inferolateral myocardium. Features are consistent with a   pseudonormal left  ventricular filling pattern, with concomitant   abnormal relaxation and increased filling pressure (grade 2   diastolic dysfunction).  Impressions:  - Since last echo, apical wall motion abnormality is new.  Patient Profile     61 year old with vasculopathic history that now includes left lower extremity ischemic necrosis, postoperative acute stroke with speech difficulty, and simultaneous development of anterior ST elevation myocardial infarction during surgical thrombectomy of left lower extremity. Now s/p left AKA for occlusion of blood flow to left lower extremity.  Assessment & Plan    1. Acute anterior ST elevation infarction : No recurrence of ischemic complaints. 2. 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 3. Left lower extremity above knee amputation. So far no acute cardiac problems post op. 4. Labile blood pressure elevation.BP remains elevated despite increasing metoprolol to 75 mg twice a day and increased amlodipine to 5 mg per day. His HR is also in the 120's with ST.  Will increase metoprolol to 100mg  BID.  Check TSH. 5.  Hypokalemia - check BMET today  Okay to transfer to stepdown.  Signed, Armanda Magicraci Turner, MD  06/30/2016, 11:39 AM

## 2016-06-30 NOTE — Progress Notes (Signed)
Subjective: Interval History: none.. Agitated. Pulling his gown off. Not following commands. No change from yesterday   Objective: Vital signs in last 24 hours: Temp:  [97.5 F (36.4 C)-98.6 F (37 C)] 98.1 F (36.7 C) (01/06 0700) Pulse Rate:  [85-145] 145 (01/06 0900) Resp:  [10-24] 21 (01/06 0800) BP: (87-182)/(48-117) 182/102 (01/06 0800) SpO2:  [93 %-100 %] 100 % (01/06 0900)  Intake/Output from previous day: 01/05 0701 - 01/06 0700 In: 120 [P.O.:120] Out: 1875 [Urine:1875] Intake/Output this shift: No intake/output data recorded.  Amputation healing without difficulty  Lab Results:  Recent Labs  06/29/16 0908  WBC 14.8*  HGB 11.0*  HCT 33.4*  PLT 766*   BMET  Recent Labs  06/29/16 0908  NA 137  K 2.8*  CL 98*  CO2 26  GLUCOSE 99  BUN <5*  CREATININE 0.86  CALCIUM 8.7*    Studies/Results: Ct Angio Head W Or Wo Contrast  Result Date: 06/21/2016 CLINICAL DATA:  61 year old male with altered mental status status post left lower extremity vascular surgery today. Possible new onset blindness. Noncontrast head CT at 1348 hours today suggesting acute left PCA infarct. Initial encounter. EXAM: CT ANGIOGRAPHY HEAD AND NECK TECHNIQUE: Multidetector CT imaging of the head and neck was performed using the standard protocol during bolus administration of intravenous contrast. Multiplanar CT image reconstructions and MIPs were obtained to evaluate the vascular anatomy. Carotid stenosis measurements (when applicable) are obtained utilizing NASCET criteria, using the distal internal carotid diameter as the denominator. CONTRAST:  50 mL Isovue 370 COMPARISON:  Head CT without contrast 1348 hours today. FINDINGS: CTA NECK Skeleton: Poor dentition. Chronic left lateral rib fractures. No acute osseous abnormality identified. Visualized paranasal sinuses and mastoids are stable and well pneumatized. Upper chest: Negative lung apices except for the possibility of emphysema. No  superior mediastinal lymphadenopathy. Other neck: Negative thyroid, larynx, pharynx, parapharyngeal spaces, retropharyngeal space, sublingual space, submandibular glands and parotid glands. Visualized orbit soft tissues are within normal limits. Visualized scalp soft tissues are within normal limits. No cervical lymphadenopathy. Aortic arch: 3 vessel arch configuration. Little arch atherosclerosis, however there is great vessel origin atherosclerosis as detailed below. Right carotid system: No brachiocephalic artery or right CCA origin stenosis despite some calcified plaque. Negative right CCA proximal to the bifurcation. At the right carotid bifurcation there is bulky calcified and low-density plaque involving the right ICA origin and bulb. However, stenosis is less than 50 % with respect to the distal vessel. Negative cervical right ICA otherwise. Left carotid system: Soft plaque with High-grade stenosis at the left CCA origin approaching a radiographic string sign, numerically estimated at 75 % with respect to the distal vessel. See series 5, image 174. The left CCA remains patent. At the left carotid bifurcation there is bulky soft and calcified plaque however, stenosis is less than 50 % with respect to the distal vessel. Negative cervical left ICA otherwise. Vertebral arteries:New line no proximal right subclavian artery stenosis. Right vertebral artery origin is normal. There is calcified right V4 segment plaque which is not hemodynamically significant. The right vertebral artery is patent to the skullbase without stenosis. No proximal left subclavian artery stenosis despite soft and calcified plaque. The left vertebral artery origin is normal. There is compression of the proximal left V2 segment in the transverse foramen related to lower cervical spine degeneration (series 5, image 127), but otherwise no left vertebral artery stenosis in the neck. Intermittent left V2 segment tortuosity. CTA HEAD Posterior  circulation: Both distal vertebral  arteries appear somewhat diminutive but are patent without stenosis. Patent vertebrobasilar junction. Both PICA origins and AICA origins appear to be patent. Patent but diminutive basilar artery. Mild distal basilar stenosis. SCA and right PCA origins are patent. There is a fetal type left PCA origin, but the left P1 segment appears likely occluded. There is superimposed distal left P2 short-segment occlusion. See series 10, image 21. Despite these findings there is distal left PCA enhancement. There is associated paucity of cerebral enhancement in the left PCA territory (series 5, image 48). There is mild right PCA branch irregularity. Anterior circulation: Both ICA siphons are patent. Both supraclinoid ICAs are diminutive but patent. There is superimposed mild stenosis of the left supraclinoid ICA. There is left greater than right anterior genu calcified plaque. The posterior communicating artery origins are normal. The carotid termini, MCA and ACA origins are patent. The anterior communicating artery is diminutive or absent. The bilateral PCA branches are patent. Left MCA M1 segment, bifurcation, and left MCA branches are patent without irregularity. Right MCA M1 segment, bifurcation, and right MCA branches are patent without irregularity. Venous sinuses: Patent. Anatomic variants: Possible fetal type left PCA origin. Review of the MIP images confirms the above findings IMPRESSION: 1. Positive for segmental occlusion of the Left PCA (P1 and distal P2 segments), but there is preserved distal PCA branch enhancement. 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 Preliminary report of these findings discussed by telephone with Dr. Caryl Pina on 06/21/2016 at 16:22 . At 1538 hours. 2. No other circle of Willis branch or large vessel occlusion, but there is High-grade stenosis at the Left CCA origin (numerically estimated at 75% but approaching a Radiographic-String-Sign) related to  combined calcified and low-density atherosclerotic plaque. 3. Generalized diminutive appearance of the intracranial circulation felt related to chronic vascular disease. Superimposed mild stenoses at the left ICA siphon, a distal basilar artery, and right PCA. 4. Soft and calcified atherosclerosis at both carotid bifurcations but not hemodynamically significant. Electronically Signed   By: Odessa Fleming M.D.   On: 06/21/2016 16:23   Ct Head Wo Contrast  Addendum Date: 06/23/2016   ADDENDUM REPORT: 06/23/2016 12:58 ADDENDUM: Study discussed by telephone with PA D. Rinehuls on 06/23/2016 at 1254 hours. Electronically Signed   By: Odessa Fleming M.D.   On: 06/23/2016 12:58   Result Date: 06/23/2016 CLINICAL DATA:  61 year old male with left PCA occlusion and suspected acute left PCA infarct on 06/21/2016. Initial encounter. EXAM: CT HEAD WITHOUT CONTRAST TECHNIQUE: Contiguous axial images were obtained from the base of the skull through the vertex without intravenous contrast. COMPARISON:  CTA head and neck and noncontrast head CT 06/21/2016. FINDINGS: Brain: Cytotoxic edema throughout the left parietal and occipital lobes. Furthermore, there is more acute appearing loss of gray-white matter differentiation in the posterior left temporal lobe and possibly involving the posterior left frontal lobe. See the gradation of abnormal density demonstrated on series 21, image 13). Mild regional mass effect including on the left occipital horn and atrium of the left lateral ventricle. Stable hypodensity at the genu of the left internal capsule which appears related to prior small vessel ischemia. There is also confluent cytotoxic edema in the right occipital pole. There is also a small area of cytotoxic edema in the posterior right cerebellum (located just superior to the area which was questioned on 06/21/2016). No posterior fossa mass effect. No associated acute intracranial hemorrhage. No ventriculomegaly. No midline shift.  Basilar cisterns are patent. No definite ACA or right MCA territory  edema. Vascular: Calcified atherosclerosis at the skull base. No suspicious intracranial vascular hyperdensity. Skull: No acute osseous abnormality identified. Sinuses/Orbits: Chronic right lamina papyracea fracture. Visualized paranasal sinuses and mastoids are stable and well pneumatized. Other: No acute orbit or scalp soft tissue finding. IMPRESSION: 1. Evolving left greater than right bilateral PCA territory infarcts with a superimposed posterior right cerebellar (AICA) infarct, with cytotoxic edema but no associated hemorrhage. 2. Superimposed more acute appearing cytotoxic edema in the posterior left temporal lobe. This could reflect a more a acute extension of the left PCA infarct, or superimposed infarct in the posterior left MCA territory. Brain MRI and intracranial MRA without contrast would best evaluate further. 3. Mild regional mass effect in the posterior left hemisphere. Basilar cisterns remain patent and there is no midline shift or ventriculomegaly. 4. 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 Electronically Signed: By: Odessa FlemingH  Hall M.D. On: 06/23/2016 12:49   Ct Head Wo Contrast  Result Date: 06/21/2016 CLINICAL DATA:  Altered mental status EXAM: CT HEAD WITHOUT CONTRAST TECHNIQUE: Contiguous axial images were obtained from the base of the skull through the vertex without intravenous contrast. COMPARISON:  None. FINDINGS: Brain: The ventricles are normal in size and configuration. There is no intracranial mass, hemorrhage, extra-axial fluid collection, or midline shift. There is patchy small vessel disease in the centra semiovale bilaterally. There is evidence of an age uncertain small infarct at the genu and posterior limb of the left internal capsule. There is a prior appearing infarct in the lateral left thalamus. There is decreased attenuation in  much of with left occipital lobe, particularly involving the medial to mid portions, felt to be indicative of acute infarct in this area. There is also decreased attenuation in a portion of the posterior mid to lower cerebellum on the right. These areas in the left occipital lobe and right cerebellum appear to represent acute infarcts. Vascular: No appreciable hyperdense vessel. There are foci of calcification in each carotid siphon region. Skull: Bony calvarium appears intact. Sinuses/Orbits: There is a small retention cyst in the lateral left maxillary antrum. There is mucosal thickening in an anterior superior ethmoid air cell on the left. There is a concha bullosa on the left, an anatomic variant. There is rightward deviation nasal septum. Visualized orbits appear symmetric bilaterally. Other: Visualized mastoid air cells are clear. IMPRESSION: Evidence of acute infarct involving a portion of the posterior mid to inferior right cerebellum as well as much of the left occipital lobe, particularly medially and mid portions. These infarcts involve the posterior circulation ; question embolic phenomenon given the bilateral nature of these infarcts. There is an age uncertain infarct in the left genu and posterior limb internal capsule. There is patchy small vessel disease in the centra semiovale as well as a small prior appearing infarct in the lateral left cerebellum. No hemorrhage, mass, or extra-axial fluid collection. Areas of vascular calcification noted in the carotid siphon regions. Mild paranasal sinus disease noted. These results were called by telephone at the time of interpretation on 06/21/2016 at 2:04 pm to Dr. Doylene Bodehris Mcalhaney, who verbally acknowledged these results. Electronically Signed   By: Bretta BangWilliam  Woodruff III M.D.   On: 06/21/2016 14:04   Ct Angio Neck W Or Wo Contrast  Result Date: 06/21/2016 CLINICAL DATA:  61 year old male with altered mental status status post left lower extremity  vascular surgery today. Possible new onset blindness. Noncontrast head CT at 1348 hours today  suggesting acute left PCA infarct. Initial encounter. EXAM: CT ANGIOGRAPHY HEAD AND NECK TECHNIQUE: Multidetector CT imaging of the head and neck was performed using the standard protocol during bolus administration of intravenous contrast. Multiplanar CT image reconstructions and MIPs were obtained to evaluate the vascular anatomy. Carotid stenosis measurements (when applicable) are obtained utilizing NASCET criteria, using the distal internal carotid diameter as the denominator. CONTRAST:  50 mL Isovue 370 COMPARISON:  Head CT without contrast 1348 hours today. FINDINGS: CTA NECK Skeleton: Poor dentition. Chronic left lateral rib fractures. No acute osseous abnormality identified. Visualized paranasal sinuses and mastoids are stable and well pneumatized. Upper chest: Negative lung apices except for the possibility of emphysema. No superior mediastinal lymphadenopathy. Other neck: Negative thyroid, larynx, pharynx, parapharyngeal spaces, retropharyngeal space, sublingual space, submandibular glands and parotid glands. Visualized orbit soft tissues are within normal limits. Visualized scalp soft tissues are within normal limits. No cervical lymphadenopathy. Aortic arch: 3 vessel arch configuration. Little arch atherosclerosis, however there is great vessel origin atherosclerosis as detailed below. Right carotid system: No brachiocephalic artery or right CCA origin stenosis despite some calcified plaque. Negative right CCA proximal to the bifurcation. At the right carotid bifurcation there is bulky calcified and low-density plaque involving the right ICA origin and bulb. However, stenosis is less than 50 % with respect to the distal vessel. Negative cervical right ICA otherwise. Left carotid system: Soft plaque with High-grade stenosis at the left CCA origin approaching a radiographic string sign, numerically estimated at 75  % with respect to the distal vessel. See series 5, image 174. The left CCA remains patent. At the left carotid bifurcation there is bulky soft and calcified plaque however, stenosis is less than 50 % with respect to the distal vessel. Negative cervical left ICA otherwise. Vertebral arteries:New line no proximal right subclavian artery stenosis. Right vertebral artery origin is normal. There is calcified right V4 segment plaque which is not hemodynamically significant. The right vertebral artery is patent to the skullbase without stenosis. No proximal left subclavian artery stenosis despite soft and calcified plaque. The left vertebral artery origin is normal. There is compression of the proximal left V2 segment in the transverse foramen related to lower cervical spine degeneration (series 5, image 127), but otherwise no left vertebral artery stenosis in the neck. Intermittent left V2 segment tortuosity. CTA HEAD Posterior circulation: Both distal vertebral arteries appear somewhat diminutive but are patent without stenosis. Patent vertebrobasilar junction. Both PICA origins and AICA origins appear to be patent. Patent but diminutive basilar artery. Mild distal basilar stenosis. SCA and right PCA origins are patent. There is a fetal type left PCA origin, but the left P1 segment appears likely occluded. There is superimposed distal left P2 short-segment occlusion. See series 10, image 21. Despite these findings there is distal left PCA enhancement. There is associated paucity of cerebral enhancement in the left PCA territory (series 5, image 48). There is mild right PCA branch irregularity. Anterior circulation: Both ICA siphons are patent. Both supraclinoid ICAs are diminutive but patent. There is superimposed mild stenosis of the left supraclinoid ICA. There is left greater than right anterior genu calcified plaque. The posterior communicating artery origins are normal. The carotid termini, MCA and ACA origins are  patent. The anterior communicating artery is diminutive or absent. The bilateral PCA branches are patent. Left MCA M1 segment, bifurcation, and left MCA branches are patent without irregularity. Right MCA M1 segment, bifurcation, and right MCA branches are patent without irregularity. Venous sinuses: Patent.  Anatomic variants: Possible fetal type left PCA origin. Review of the MIP images confirms the above findings IMPRESSION: 1. Positive for segmental occlusion of the Left PCA (P1 and distal P2 segments), but there is preserved distal PCA branch enhancement. 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 Preliminary report of these findings discussed by telephone with Dr. Caryl Pina on 06/21/2016 at 16:22 . At 1538 hours. 2. No other circle of Willis branch or large vessel occlusion, but there is High-grade stenosis at the Left CCA origin (numerically estimated at 75% but approaching a Radiographic-String-Sign) related to combined calcified and low-density atherosclerotic plaque. 3. Generalized diminutive appearance of the intracranial circulation felt related to chronic vascular disease. Superimposed mild stenoses at the left ICA siphon, a distal basilar artery, and right PCA. 4. Soft and calcified atherosclerosis at both carotid bifurcations but not hemodynamically significant. Electronically Signed   By: Odessa Fleming M.D.   On: 06/21/2016 16:23   Dg Ang/ext/uni/or Left  Result Date: 06/21/2016 CLINICAL DATA:  61 year old male with acute left lower extremity ischemia EXAM: LEFT ANG/EXT/UNI/ OR CONTRAST:  Op note FLUOROSCOPY TIME:  Op note COMPARISON:  None. FINDINGS: Two intraoperative fluoroscopic spot images of left lower extremity angiogram demonstrates partial opacification of left femoral distal bypass, with partial opacification of tibial vessels. IMPRESSION: Intraoperative left lower extremity angiogram for acute leg ischemia, as above. Please refer to the dictated operative report for full details of  intraoperative findings and procedure. Signed, Yvone Neu. Loreta Ave, DO [facility] Electronically Signed   By: Gilmer Mor D.O.   On: 06/21/2016 12:58   Anti-infectives: Anti-infectives    Start     Dose/Rate Route Frequency Ordered Stop   06/25/16 0730  ceFAZolin (ANCEF) IVPB 2g/100 mL premix  Status:  Discontinued    Comments:  Send with pt to OR   2 g 200 mL/hr over 30 Minutes Intravenous To [facility] 06/25/16 0420 06/25/16 1005   06/20/16 1730  ceFAZolin (ANCEF) IVPB 2g/100 mL premix     2 g 200 mL/hr over 30 Minutes Intravenous Every 8 hours 06/20/16 1621 06/20/16 1814      Assessment/Plan: s/p Procedure(s): AMPUTATION ABOVE KNEE (Left) Okay for stepdown transfer. Skilled nursing facility at discharge   LOS: 11 days   Aliyanna Wassmer, Tawanna Cooler 06/30/2016, 9:29 AM

## 2016-07-01 MED ORDER — POTASSIUM CHLORIDE CRYS ER 20 MEQ PO TBCR
20.0000 meq | EXTENDED_RELEASE_TABLET | Freq: Once | ORAL | Status: AC
  Administered 2016-07-01: 20 meq via ORAL
  Filled 2016-07-01: qty 1

## 2016-07-01 MED ORDER — METOPROLOL TARTRATE 25 MG PO TABS
125.0000 mg | ORAL_TABLET | Freq: Two times a day (BID) | ORAL | Status: DC
  Administered 2016-07-01 – 2016-07-09 (×17): 125 mg via ORAL
  Filled 2016-07-01 (×18): qty 1

## 2016-07-01 MED ORDER — METOPROLOL TARTRATE 25 MG PO TABS
25.0000 mg | ORAL_TABLET | Freq: Once | ORAL | Status: AC
  Administered 2016-07-01: 25 mg via ORAL
  Filled 2016-07-01: qty 1

## 2016-07-01 MED ORDER — SPIRONOLACTONE 25 MG PO TABS
25.0000 mg | ORAL_TABLET | Freq: Every day | ORAL | Status: DC
  Administered 2016-07-01 – 2016-07-10 (×10): 25 mg via ORAL
  Filled 2016-07-01 (×10): qty 1

## 2016-07-01 NOTE — Progress Notes (Addendum)
Patient Name: Stephen ApleyJerome D Bowen Date of Encounter: 07/01/2016  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  Denies CP or SOB  Inpatient Medications    Scheduled Meds: . amLODipine  5 mg Oral Daily  . aspirin EC  81 mg Oral Daily  . atorvastatin  80 mg Oral q1800  . clopidogrel  75 mg Oral Daily  . folic acid  1 mg Oral Daily  . heparin  5,000 Units Subcutaneous Q8H  . isosorbide mononitrate  60 mg Oral Daily  . mouth rinse  15 mL Mouth Rinse BID  . metoprolol tartrate  100 mg Oral BID  . multivitamin with minerals  1 tablet Oral Daily  . pantoprazole  40 mg Oral Daily  . polyethylene glycol  17 g Oral Daily  . sodium chloride flush  3 mL Intravenous Q12H  . thiamine  100 mg Oral Daily   Continuous Infusions:  PRN Meds: sodium chloride, acetaminophen **OR** acetaminophen, alum & mag hydroxide-simeth, bisacodyl, bisacodyl, guaiFENesin-dextromethorphan, hydrALAZINE, HYDROmorphone (DILAUDID) injection, morphine injection, ondansetron, oxyCODONE-acetaminophen, phenol, sodium chloride flush, sodium phosphate   Vital Signs    Vitals:   07/01/16 0800 07/01/16 0900 07/01/16 1000 07/01/16 1100  BP: (!) 145/89 (!) 146/88 (!) 142/87 (!) 156/96  Pulse: 96  (!) 29   Resp: 15 10 14 19   Temp:      TempSrc:      SpO2: 93% 100% 100% 100%  Weight:      Height:        Intake/Output Summary (Last 24 hours) at 07/01/16 1143 Last data filed at 07/01/16 0800  Gross per 24 hour  Intake              120 ml  Output             1035 ml  Net              -915 ml   Filed Weights   06/20/16 1027  Weight: 122 lb 5.7 oz (55.5 kg)    Physical Exam  Malnourished-appearing 61 year old gentleman appearing somewhat confused. GEN: Chronically ill and in no acute distress.  HEENT: Grossly normal.  Neck: Supple, with moderate JVD. No obvious carotid bruit is heard. Cardiac: A soft S4 gallop is audible. RRR, no murmurs, rubs. No clubbing, cyanosis, edema.  tachycardic Extremities: left AKA Respiratory:  Respirations regular and unlabored, clear to auscultation bilaterally. GI: Soft, nontender, nondistended, BS + x 4. MS: s/p left AKA Skin: warm and dry, no rash. Neuro:  Strength decreased in left leg. Decreased alertness. Psych: AAOx0.  Detached Affect.  Labs    CBC  Recent Labs  06/29/16 0908  WBC 14.8*  HGB 11.0*  HCT 33.4*  MCV 91.5  PLT 766*   Basic Metabolic Panel  Recent Labs  06/29/16 0908 06/30/16 1439  NA 137 135  K 2.8* 3.6  CL 98* 98*  CO2 26 23  GLUCOSE 99 114*  BUN <5* 6  CREATININE 0.86 0.78  CALCIUM 8.7* 8.4*   Liver Function Tests No results for input(s): AST, ALT, ALKPHOS, BILITOT, PROT, ALBUMIN in the last 72 hours. No results for input(s): LIPASE, AMYLASE in the last 72 hours. Cardiac Enzymes No results for input(s): CKTOTAL, CKMB, CKMBINDEX, TROPONINI in the last 72 hours.   Telemetry    Sinus Rhythm with with premature ventricular beats. - Personally Reviewed  ECG  A new tracing is not performed today..- Personally Reviewed  Radiology    No results found.  Cardiac Studies   ECHOCARDIOGRAM 06/23/16: Study Conclusions  - Left ventricle: The cavity size was normal. Wall thickness was   increased in a pattern of moderate LVH. Systolic function was   normal. The estimated ejection fraction was in the range of 60%   to 65%. There is akinesis of the apicalanteroseptal and   inferolateral myocardium. Features are consistent with a   pseudonormal left ventricular filling pattern, with  concomitant   abnormal relaxation and increased filling pressure (grade 2   diastolic dysfunction).  Impressions:  - Since last echo, apical wall motion abnormality is new.  Patient Profile     61 year old with vasculopathic history that now includes left lower extremity ischemic necrosis, postoperative acute stroke with speech difficulty, and simultaneous development of anterior ST elevation myocardial infarction during surgical thrombectomy of left lower extremity. Now s/p left AKA for occlusion of blood flow to left lower extremity.  Assessment & Plan    1. Acute anterior ST elevation infarction : No recurrence of ischemic complaints. 2. 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 3. Left lower extremity above knee amputation. So far no acute cardiac problems post op. 4. Labile blood pressure elevation.BP remains elevated despite increasing metoprolol to 100 mg twice a day and increased amlodipine to 5 mg per day. His HR is also in the 110's with ST.  Will increase metoprolol to 125mg  BID. TSH is normal. 5.  Hypokalemia - replete.  Okay to transfer to stepdown.  Signed, Armanda Magic, MD  07/01/2016, 11:43 AM

## 2016-07-01 NOTE — Progress Notes (Signed)
   Add aldactone for BP.  BMET daily x 3

## 2016-07-01 NOTE — Progress Notes (Signed)
Subjective: Interval History: none.. Less agitated today. Remains hemodynamically stable   Objective: Vital signs in last 24 hours: Temp:  [98.3 F (36.8 C)-98.6 F (37 C)] 98.6 F (37 C) (01/07 0453) Pulse Rate:  [73-115] 96 (01/07 0800) Resp:  [11-19] 15 (01/07 0800) BP: (116-175)/(83-109) 145/89 (01/07 0800) SpO2:  [93 %-100 %] 93 % (01/07 0800)  Intake/Output from previous day: 01/06 0701 - 01/07 0700 In: 360 [P.O.:360] Out: 1125 [Urine:1125] Intake/Output this shift: Total I/O In: -  Out: 110 [Urine:110]  Above-knee amputation site healing without difficulty  Lab Results:  Recent Labs  06/29/16 0908  WBC 14.8*  HGB 11.0*  HCT 33.4*  PLT 766*   BMET  Recent Labs  06/29/16 0908 06/30/16 1439  NA 137 135  K 2.8* 3.6  CL 98* 98*  CO2 26 23  GLUCOSE 99 114*  BUN <5* 6  CREATININE 0.86 0.78  CALCIUM 8.7* 8.4*    Studies/Results: Ct Angio Head W Or Wo Contrast  Result Date: 06/21/2016 CLINICAL DATA:  61 year old male with altered mental status status post left lower extremity vascular surgery today. Possible new onset blindness. Noncontrast head CT at 1348 hours today suggesting acute left PCA infarct. Initial encounter. EXAM: CT ANGIOGRAPHY HEAD AND NECK TECHNIQUE: Multidetector CT imaging of the head and neck was performed using the standard protocol during bolus administration of intravenous contrast. Multiplanar CT image reconstructions and MIPs were obtained to evaluate the vascular anatomy. Carotid stenosis measurements (when applicable) are obtained utilizing NASCET criteria, using the distal internal carotid diameter as the denominator. CONTRAST:  50 mL Isovue 370 COMPARISON:  Head CT without contrast 1348 hours today. FINDINGS: CTA NECK Skeleton: Poor dentition. Chronic left lateral rib fractures. No acute osseous abnormality identified. Visualized paranasal sinuses and mastoids are stable and well pneumatized. Upper chest: Negative lung apices except for  the possibility of emphysema. No superior mediastinal lymphadenopathy. Other neck: Negative thyroid, larynx, pharynx, parapharyngeal spaces, retropharyngeal space, sublingual space, submandibular glands and parotid glands. Visualized orbit soft tissues are within normal limits. Visualized scalp soft tissues are within normal limits. No cervical lymphadenopathy. Aortic arch: 3 vessel arch configuration. Little arch atherosclerosis, however there is great vessel origin atherosclerosis as detailed below. Right carotid system: No brachiocephalic artery or right CCA origin stenosis despite some calcified plaque. Negative right CCA proximal to the bifurcation. At the right carotid bifurcation there is bulky calcified and low-density plaque involving the right ICA origin and bulb. However, stenosis is less than 50 % with respect to the distal vessel. Negative cervical right ICA otherwise. Left carotid system: Soft plaque with High-grade stenosis at the left CCA origin approaching a radiographic string sign, numerically estimated at 75 % with respect to the distal vessel. See series 5, image 174. The left CCA remains patent. At the left carotid bifurcation there is bulky soft and calcified plaque however, stenosis is less than 50 % with respect to the distal vessel. Negative cervical left ICA otherwise. Vertebral arteries:New line no proximal right subclavian artery stenosis. Right vertebral artery origin is normal. There is calcified right V4 segment plaque which is not hemodynamically significant. The right vertebral artery is patent to the skullbase without stenosis. No proximal left subclavian artery stenosis despite soft and calcified plaque. The left vertebral artery origin is normal. There is compression of the proximal left V2 segment in the transverse foramen related to lower cervical spine degeneration (series 5, image 127), but otherwise no left vertebral artery stenosis in the neck. Intermittent left V2  segment  tortuosity. CTA HEAD Posterior circulation: Both distal vertebral arteries appear somewhat diminutive but are patent without stenosis. Patent vertebrobasilar junction. Both PICA origins and AICA origins appear to be patent. Patent but diminutive basilar artery. Mild distal basilar stenosis. SCA and right PCA origins are patent. There is a fetal type left PCA origin, but the left P1 segment appears likely occluded. There is superimposed distal left P2 short-segment occlusion. See series 10, image 21. Despite these findings there is distal left PCA enhancement. There is associated paucity of cerebral enhancement in the left PCA territory (series 5, image 48). There is mild right PCA branch irregularity. Anterior circulation: Both ICA siphons are patent. Both supraclinoid ICAs are diminutive but patent. There is superimposed mild stenosis of the left supraclinoid ICA. There is left greater than right anterior genu calcified plaque. The posterior communicating artery origins are normal. The carotid termini, MCA and ACA origins are patent. The anterior communicating artery is diminutive or absent. The bilateral PCA branches are patent. Left MCA M1 segment, bifurcation, and left MCA branches are patent without irregularity. Right MCA M1 segment, bifurcation, and right MCA branches are patent without irregularity. Venous sinuses: Patent. Anatomic variants: Possible fetal type left PCA origin. Review of the MIP images confirms the above findings IMPRESSION: 1. Positive for segmental occlusion of the Left PCA (P1 and distal P2 segments), but there is preserved distal PCA branch enhancement. 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 Preliminary report of these findings discussed by telephone with Dr. Caryl Pina on 06/21/2016 at 16:22 . At 1538 hours. 2. No other circle of Willis branch or large vessel occlusion, but there is High-grade stenosis at the Left CCA origin (numerically estimated at 75% but approaching a  Radiographic-String-Sign) related to combined calcified and low-density atherosclerotic plaque. 3. Generalized diminutive appearance of the intracranial circulation felt related to chronic vascular disease. Superimposed mild stenoses at the left ICA siphon, a distal basilar artery, and right PCA. 4. Soft and calcified atherosclerosis at both carotid bifurcations but not hemodynamically significant. Electronically Signed   By: Odessa Fleming M.D.   On: 06/21/2016 16:23   Ct Head Wo Contrast  Addendum Date: 06/23/2016   ADDENDUM REPORT: 06/23/2016 12:58 ADDENDUM: Study discussed by telephone with PA D. Rinehuls on 06/23/2016 at 1254 hours. Electronically Signed   By: Odessa Fleming M.D.   On: 06/23/2016 12:58   Result Date: 06/23/2016 CLINICAL DATA:  60 year old male with left PCA occlusion and suspected acute left PCA infarct on 06/21/2016. Initial encounter. EXAM: CT HEAD WITHOUT CONTRAST TECHNIQUE: Contiguous axial images were obtained from the base of the skull through the vertex without intravenous contrast. COMPARISON:  CTA head and neck and noncontrast head CT 06/21/2016. FINDINGS: Brain: Cytotoxic edema throughout the left parietal and occipital lobes. Furthermore, there is more acute appearing loss of gray-white matter differentiation in the posterior left temporal lobe and possibly involving the posterior left frontal lobe. See the gradation of abnormal density demonstrated on series 21, image 13). Mild regional mass effect including on the left occipital horn and atrium of the left lateral ventricle. Stable hypodensity at the genu of the left internal capsule which appears related to prior small vessel ischemia. There is also confluent cytotoxic edema in the right occipital pole. There is also a small area of cytotoxic edema in the posterior right cerebellum (located just superior to the area which was questioned on 06/21/2016). No posterior fossa mass effect. No associated acute intracranial hemorrhage. No  ventriculomegaly. No midline shift. Basilar  cisterns are patent. No definite ACA or right MCA territory edema. Vascular: Calcified atherosclerosis at the skull base. No suspicious intracranial vascular hyperdensity. Skull: No acute osseous abnormality identified. Sinuses/Orbits: Chronic right lamina papyracea fracture. Visualized paranasal sinuses and mastoids are stable and well pneumatized. Other: No acute orbit or scalp soft tissue finding. IMPRESSION: 1. Evolving left greater than right bilateral PCA territory infarcts with a superimposed posterior right cerebellar (AICA) infarct, with cytotoxic edema but no associated hemorrhage. 2. Superimposed more acute appearing cytotoxic edema in the posterior left temporal lobe. This could reflect a more a acute extension of the left PCA infarct, or superimposed infarct in the posterior left MCA territory. Brain MRI and intracranial MRA without contrast would best evaluate further. 3. Mild regional mass effect in the posterior left hemisphere. Basilar cisterns remain patent and there is no midline shift or ventriculomegaly. 4. 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 Electronically Signed: By: Odessa Fleming M.D. On: 06/23/2016 12:49   Ct Head Wo Contrast  Result Date: 06/21/2016 CLINICAL DATA:  Altered mental status EXAM: CT HEAD WITHOUT CONTRAST TECHNIQUE: Contiguous axial images were obtained from the base of the skull through the vertex without intravenous contrast. COMPARISON:  None. FINDINGS: Brain: The ventricles are normal in size and configuration. There is no intracranial mass, hemorrhage, extra-axial fluid collection, or midline shift. There is patchy small vessel disease in the centra semiovale bilaterally. There is evidence of an age uncertain small infarct at the genu and posterior limb of the left internal capsule. There is a prior appearing infarct in the lateral left thalamus.  There is decreased attenuation in much of with left occipital lobe, particularly involving the medial to mid portions, felt to be indicative of acute infarct in this area. There is also decreased attenuation in a portion of the posterior mid to lower cerebellum on the right. These areas in the left occipital lobe and right cerebellum appear to represent acute infarcts. Vascular: No appreciable hyperdense vessel. There are foci of calcification in each carotid siphon region. Skull: Bony calvarium appears intact. Sinuses/Orbits: There is a small retention cyst in the lateral left maxillary antrum. There is mucosal thickening in an anterior superior ethmoid air cell on the left. There is a concha bullosa on the left, an anatomic variant. There is rightward deviation nasal septum. Visualized orbits appear symmetric bilaterally. Other: Visualized mastoid air cells are clear. IMPRESSION: Evidence of acute infarct involving a portion of the posterior mid to inferior right cerebellum as well as much of the left occipital lobe, particularly medially and mid portions. These infarcts involve the posterior circulation ; question embolic phenomenon given the bilateral nature of these infarcts. There is an age uncertain infarct in the left genu and posterior limb internal capsule. There is patchy small vessel disease in the centra semiovale as well as a small prior appearing infarct in the lateral left cerebellum. No hemorrhage, mass, or extra-axial fluid collection. Areas of vascular calcification noted in the carotid siphon regions. Mild paranasal sinus disease noted. These results were called by telephone at the time of interpretation on 06/21/2016 at 2:04 pm to Dr. Doylene Bode, who verbally acknowledged these results. Electronically Signed   By: Bretta Bang III M.D.   On: 06/21/2016 14:04   Ct Angio Neck W Or Wo Contrast  Result Date: 06/21/2016 CLINICAL DATA:  61 year old male with altered mental status status  post left lower extremity vascular surgery today. Possible  new onset blindness. Noncontrast head CT at 1348 hours today suggesting acute left PCA infarct. Initial encounter. EXAM: CT ANGIOGRAPHY HEAD AND NECK TECHNIQUE: Multidetector CT imaging of the head and neck was performed using the standard protocol during bolus administration of intravenous contrast. Multiplanar CT image reconstructions and MIPs were obtained to evaluate the vascular anatomy. Carotid stenosis measurements (when applicable) are obtained utilizing NASCET criteria, using the distal internal carotid diameter as the denominator. CONTRAST:  50 mL Isovue 370 COMPARISON:  Head CT without contrast 1348 hours today. FINDINGS: CTA NECK Skeleton: Poor dentition. Chronic left lateral rib fractures. No acute osseous abnormality identified. Visualized paranasal sinuses and mastoids are stable and well pneumatized. Upper chest: Negative lung apices except for the possibility of emphysema. No superior mediastinal lymphadenopathy. Other neck: Negative thyroid, larynx, pharynx, parapharyngeal spaces, retropharyngeal space, sublingual space, submandibular glands and parotid glands. Visualized orbit soft tissues are within normal limits. Visualized scalp soft tissues are within normal limits. No cervical lymphadenopathy. Aortic arch: 3 vessel arch configuration. Little arch atherosclerosis, however there is great vessel origin atherosclerosis as detailed below. Right carotid system: No brachiocephalic artery or right CCA origin stenosis despite some calcified plaque. Negative right CCA proximal to the bifurcation. At the right carotid bifurcation there is bulky calcified and low-density plaque involving the right ICA origin and bulb. However, stenosis is less than 50 % with respect to the distal vessel. Negative cervical right ICA otherwise. Left carotid system: Soft plaque with High-grade stenosis at the left CCA origin approaching a radiographic string sign,  numerically estimated at 75 % with respect to the distal vessel. See series 5, image 174. The left CCA remains patent. At the left carotid bifurcation there is bulky soft and calcified plaque however, stenosis is less than 50 % with respect to the distal vessel. Negative cervical left ICA otherwise. Vertebral arteries:New line no proximal right subclavian artery stenosis. Right vertebral artery origin is normal. There is calcified right V4 segment plaque which is not hemodynamically significant. The right vertebral artery is patent to the skullbase without stenosis. No proximal left subclavian artery stenosis despite soft and calcified plaque. The left vertebral artery origin is normal. There is compression of the proximal left V2 segment in the transverse foramen related to lower cervical spine degeneration (series 5, image 127), but otherwise no left vertebral artery stenosis in the neck. Intermittent left V2 segment tortuosity. CTA HEAD Posterior circulation: Both distal vertebral arteries appear somewhat diminutive but are patent without stenosis. Patent vertebrobasilar junction. Both PICA origins and AICA origins appear to be patent. Patent but diminutive basilar artery. Mild distal basilar stenosis. SCA and right PCA origins are patent. There is a fetal type left PCA origin, but the left P1 segment appears likely occluded. There is superimposed distal left P2 short-segment occlusion. See series 10, image 21. Despite these findings there is distal left PCA enhancement. There is associated paucity of cerebral enhancement in the left PCA territory (series 5, image 48). There is mild right PCA branch irregularity. Anterior circulation: Both ICA siphons are patent. Both supraclinoid ICAs are diminutive but patent. There is superimposed mild stenosis of the left supraclinoid ICA. There is left greater than right anterior genu calcified plaque. The posterior communicating artery origins are normal. The carotid  termini, MCA and ACA origins are patent. The anterior communicating artery is diminutive or absent. The bilateral PCA branches are patent. Left MCA M1 segment, bifurcation, and left MCA branches are patent without irregularity. Right MCA M1 segment, bifurcation, and  right MCA branches are patent without irregularity. Venous sinuses: Patent. Anatomic variants: Possible fetal type left PCA origin. Review of the MIP images confirms the above findings IMPRESSION: 1. Positive for segmental occlusion of the Left PCA (P1 and distal P2 segments), but there is preserved distal PCA branch enhancement. 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 Preliminary report of these findings discussed by telephone with Dr. Caryl Pina on 06/21/2016 at 16:22 . At 1538 hours. 2. No other circle of Willis branch or large vessel occlusion, but there is High-grade stenosis at the Left CCA origin (numerically estimated at 75% but approaching a Radiographic-String-Sign) related to combined calcified and low-density atherosclerotic plaque. 3. Generalized diminutive appearance of the intracranial circulation felt related to chronic vascular disease. Superimposed mild stenoses at the left ICA siphon, a distal basilar artery, and right PCA. 4. Soft and calcified atherosclerosis at both carotid bifurcations but not hemodynamically significant. Electronically Signed   By: Odessa Fleming M.D.   On: 06/21/2016 16:23   Dg Ang/ext/uni/or Left  Result Date: 06/21/2016 CLINICAL DATA:  61 year old male with acute left lower extremity ischemia EXAM: LEFT ANG/EXT/UNI/ OR CONTRAST:  Op note FLUOROSCOPY TIME:  Op note COMPARISON:  None. FINDINGS: Two intraoperative fluoroscopic spot images of left lower extremity angiogram demonstrates partial opacification of left femoral distal bypass, with partial opacification of tibial vessels. IMPRESSION: Intraoperative left lower extremity angiogram for acute leg ischemia, as above. Please refer to the dictated operative  report for full details of intraoperative findings and procedure. Signed, Yvone Neu. Loreta Ave, DO [facility] Rad[facility] Electronically Signed   By: Gilmer Mor D.O.   On: 06/21/2016 12:58   Anti-infectives: Anti-infectives    Start     Dose/Rate Route Frequency Ordered Stop   06/25/16 0730  ceFAZolin (ANCEF) IVPB 2g/100 mL premix  Status:  Discontinued    Comments:  Send with pt to OR   2 g 200 mL/hr over 30 Minutes Intravenous To [facility] 06/25/16 0420 06/25/16 1005   06/20/16 1730  ceFAZolin (ANCEF) IVPB 2g/100 mL premix     2 g 200 mL/hr over 30 Minutes Intravenous Every 8 hours 06/20/16 1621 06/20/16 1814      Assessment/Plan: s/p Procedure(s): AMPUTATION ABOVE KNEE (Left) Stable overall. Awaiting skilled nursing facility placement   LOS: 12 days   Dijuan Sleeth 07/01/2016, 9:21 AM

## 2016-07-02 LAB — BASIC METABOLIC PANEL
ANION GAP: 10 (ref 5–15)
BUN: 9 mg/dL (ref 6–20)
CALCIUM: 9 mg/dL (ref 8.9–10.3)
CHLORIDE: 99 mmol/L — AB (ref 101–111)
CO2: 28 mmol/L (ref 22–32)
Creatinine, Ser: 1.11 mg/dL (ref 0.61–1.24)
GFR calc non Af Amer: >60 mL/min (ref >60)
GLUCOSE: 112 mg/dL — AB (ref 65–99)
Potassium: 4.1 mmol/L (ref 3.5–5.1)
Sodium: 137 mmol/L (ref 135–145)

## 2016-07-02 NOTE — Progress Notes (Addendum)
  Progress Note    07/02/2016 7:44 AM 7 Days Post-Op  Subjective:  Sleeping-awakes easily; denies chest pain  Afebrile HR 70's 90's NSR 100's-160's systolic 99% RA  Vitals:   07/02/16 0600 07/02/16 0700  BP: (!) 144/88 (!) 160/98  Pulse: 74 82  Resp: 14 14  Temp:      Physical Exam: Cardiac:  Regular/tachy Lungs:  Non labored Incisions:  Left stump incision is healing nicely Extremities:  Moving all extremities equally   CBC    Component Value Date/Time   WBC 14.8 (H) 06/29/2016 0908   RBC 3.65 (L) 06/29/2016 0908   HGB 11.0 (L) 06/29/2016 0908   HCT 33.4 (L) 06/29/2016 0908   PLT 766 (H) 06/29/2016 0908   MCV 91.5 06/29/2016 0908   MCH 30.1 06/29/2016 0908   MCHC 32.9 06/29/2016 0908   RDW 15.3 06/29/2016 0908   LYMPHSABS 1.1 02/13/2016 1519   MONOABS 0.6 02/13/2016 1519   EOSABS 0.0 02/13/2016 1519   BASOSABS 0.0 02/13/2016 1519    BMET    Component Value Date/Time   NA 137 07/02/2016 0237   K 4.1 07/02/2016 0237   CL 99 (L) 07/02/2016 0237   CO2 28 07/02/2016 0237   GLUCOSE 112 (H) 07/02/2016 0237   BUN 9 07/02/2016 0237   CREATININE 1.11 07/02/2016 0237   CREATININE 0.92 11/13/2013 1031   CALCIUM 9.0 07/02/2016 0237   GFRNONAA >60 07/02/2016 0237   GFRNONAA >89 11/13/2013 1031   GFRAA >60 07/02/2016 0237   GFRAA >89 11/13/2013 1031    INR    Component Value Date/Time   INR 1.07 06/25/2016 0925     Intake/Output Summary (Last 24 hours) at 07/02/16 0744 Last data filed at 07/02/16 0600  Gross per 24 hour  Intake                0 ml  Output             1225 ml  Net            -1225 ml     Assessment:  61 y.o. male is s/p:  1.  Thromboembolectomy of left femoral to PT bypass graft 2.  Harvest of left small saphenous vein 3.  Bypass from existing graft to distal PT artery with 6mm ringed ptfe with distal vein patch 4.  Left lower extremity angiogram 11 Days Post-Op And Left above knee amputation 7 Days Post-Op  Plan: -pt's left  stump is healing nicely -cardiology has increased metoprolol and added aldactone.  HR has improved as well as BP -hypokalemia resolved -acute blood loss anemia improving -DVT prophylaxis:  SQ heparin -will need SNF at discharge -has orders to move to [facility], however, they are moving units today-will d/w Dr. Randie Heinzain if pt is okay to go to [facility].   Doreatha MassedSamantha Rhyne, PA-C [facility] [phone] 07/02/2016 7:44 AM

## 2016-07-02 NOTE — Progress Notes (Signed)
Patient Name: Stephen Bowen Date of Encounter: 07/02/2016  Primary Cardiologist: Dr. 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  Denies chest pain or shortness of breath  Inpatient Medications    Scheduled Meds: . amLODipine  5 mg Oral Daily  . aspirin EC  81 mg Oral Daily  . atorvastatin  80 mg Oral q1800  . clopidogrel  75 mg Oral Daily  . folic acid  1 mg Oral Daily  . heparin  5,000 Units Subcutaneous Q8H  . isosorbide mononitrate  60 mg Oral Daily  . mouth rinse  15 mL Mouth Rinse BID  . metoprolol tartrate  125 mg Oral BID  . multivitamin with minerals  1 tablet Oral Daily  . pantoprazole  40 mg Oral Daily  . polyethylene glycol  17 g Oral Daily  . sodium chloride flush  3 mL Intravenous Q12H  . spironolactone  25 mg Oral Daily  . thiamine  100 mg Oral Daily   Continuous Infusions:  PRN Meds: sodium chloride, acetaminophen **OR** acetaminophen, alum & mag hydroxide-simeth, bisacodyl, bisacodyl, guaiFENesin-dextromethorphan, hydrALAZINE, HYDROmorphone (DILAUDID) injection, morphine injection, ondansetron, oxyCODONE-acetaminophen, phenol, sodium chloride flush, sodium phosphate   Vital Signs    Vitals:   07/02/16 1200 07/02/16 1201 07/02/16 1300 07/02/16 1400  BP: 132/90  120/83 119/86  Pulse: 78  74 84  Resp: 15  16 13   Temp:  97.9 F (36.6 C)    TempSrc:  Oral    SpO2: 98%  98% 98%  Weight:      Height:        Intake/Output Summary (Last 24 hours) at 07/02/16 1420 Last data filed at 07/02/16 1226  Gross per 24 hour  Intake              240 ml  Output             1315 ml    Net            -1075 ml   Filed Weights   06/20/16 1027  Weight: 55.5 kg (122 lb 5.7 oz)    Physical Exam    GEN: Malnourished, well developed, in no acute distress.  Mildly confused HEENT: Grossly normal.  Neck: Supple, no JVD, carotid bruits, or masses. Cardiac: RRR, no murmurs, rubs, or gallops.  Extremities: Mittens in place.  L AKA.  No clubbing, cyanosis, edema.   Respiratory:  Respirations regular and unlabored, clear to auscultation bilaterally. GI: Soft, nontender, nondistended, BS + x 4. MS: no deformity or atrophy. Skin: warm and dry, no rash. Neuro:  Strength and sensation are intact. Psych: AAOx3.  Normal affect.  Labs    CBC No results for input(s): WBC, NEUTROABS, HGB, HCT, MCV, PLT in the last 72 hours. Basic Metabolic Panel  Recent Labs  06/30/16 1439 07/02/16 0237  NA 135 137  K 3.6 4.1  CL 98* 99*  CO2 23 28  GLUCOSE 114* 112*  BUN 6 9  CREATININE 0.78 1.11  CALCIUM 8.4* 9.0   Liver Function Tests No  results for input(s): AST, ALT, ALKPHOS, BILITOT, PROT, ALBUMIN in the last 72 hours. No results for input(s): LIPASE, AMYLASE in the last 72 hours. Cardiac Enzymes No results for input(s): CKTOTAL, CKMB, CKMBINDEX, TROPONINI in the last 72 hours. BNP Invalid input(s): POCBNP D-Dimer No results for input(s): DDIMER in the last 72 hours. Hemoglobin A1C No results for input(s): HGBA1C in the last 72 hours. Fasting Lipid Panel No results for input(s): CHOL, HDL, LDLCALC, TRIG, CHOLHDL, LDLDIRECT in the last 72 hours. Thyroid Function Tests  Recent Labs  06/30/16 1439  TSH 1.162    Telemetry    No events  - Personally Reviewed  ECG    N/a - Personally Reviewed  Radiology    No results found.  Cardiac Studies   Echo 06/23/16: Study Conclusions  - Left ventricle: The cavity size was normal. Wall thickness was   increased in a pattern of moderate LVH. Systolic function was   normal. The estimated ejection fraction was in the  range of 60%   to 65%. There is akinesis of the apicalanteroseptal and   inferolateral myocardium. Features are consistent with a   pseudonormal left ventricular filling pattern, with concomitant   abnormal relaxation and increased filling pressure (grade 2   diastolic dysfunction).  Impressions:  - Since last echo, apical wall motion abnormality is new.  Patient Profile     [name] is a 79M with PAD and CLI s/p L AKA, hypertension who had transient anterior ST elevations and an acute stroke during surgical thrombectomy of the left lower extremity.   Assessment & Plan    # Acute anterior STEMI: [name] had an aborted STEMI with ST elevations during surgery.  He has not had any symptoms of ischemia since that time and currently denies chest pain.  Troponin was elevated to >65.  His echocardiogram shows LVEF 60-65% with akinesis of the apical anteroseptal and inferolateral myocardium.  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  Given his lack of symptoms and preserved LV function.  We will likely plan for medical management with outpatient stress testing vs. Cath when stable. Continue aspirin, clopidogrel, metoprolol and Imdur.  He is not on a statin due to total cholesterol of 77 and LDL 37.  # Hypertension: BP is better-controlled.  Continue metoprolol, amlodipine and spironolactone.  He was on HCTZ/lisinopril as an outpatient.  Consider adding lisinopril if BP allows.    # PAD: # L AKA:  Per vascular surgery.  Continue aspirin and clopidogrel.   Signed, Chilton Siiffany Peletier, MD  07/02/2016, 2:20 PM

## 2016-07-02 NOTE — Progress Notes (Addendum)
Occupational Therapy Treatment Patient Details Name: Stephen Bowen MRN: [id number] DOB: 12-01-1955 Today's Date: 07/02/2016    History of present illness 61 y.o. male who presented to [facility] for management of left ischemic foot secondary to occluded left femoral to posterior tibial bypass. He underwent thromboembolectomy of the bypass graft.  During the procedure the patient was having ST changes and cardiology was consulted. Pt with MI. The patient on awakening from anesthesia was aphasic and IP Code Stroke was called. CT 12/28 acute infarct involving a portion of the posterior mid to inferior right cerebellum as well as much of the left occipital lobe, particularly medially and mid portions. 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 PMHx: HTN, PAD   OT comments  Pt making progress toward functional goals. Pt does not have adequate support available after DC therefore will need rehab at [facility]. Will further assess self feeding.  Will continue to follow.   Follow Up Recommendations  SNF;Supervision/Assistance - 24 hour    Equipment Recommendations  Other (comment) (TBA at SNF)    Recommendations for Other Services      Precautions / Restrictions Precautions Precautions: Fall Precaution Comments: L AKA, blind Restrictions Weight Bearing Restrictions: Yes LLE Weight Bearing: Non weight bearing       Mobility Bed Mobility Overal bed mobility: Needs Assistance Bed Mobility: Supine to Sit     Supine to sit: Mod assist     General bed mobility comments: pt OOB in chair  Transfers Overall transfer level: Needs assistance Equipment used: Rolling walker (2 wheeled) (2 person lift) Transfers: Sit to/from Stand Sit to Stand: Min assist - able to stand for pericare. Unable to release UE and balance to complete hygiene     Balance Overall balance assessment: Needs assistance Sitting-balance support: Single extremity supported Sitting balance-Leahy Scale: Fair     Standing balance support: Bilateral  upper extremity supported Standing balance-Leahy Scale: Poor Standing balance comment: needs rw                   ADL Overall ADL's : Needs assistance/impaired Eating/Feeding: Set up;Supervision/ safety Eating/Feeding Details (indicate cue type and reason): hand over hand to place cup in hands. Pt able to bring cup to mouth appropriately Grooming: Minimal assistance Grooming Details (indicate cue type and reason): assist to place soap on cloth and for sequencing     Lower Body Bathing: Minimal assistance                       Functional mobility during ADLs: Minimal assistance (sit - stand)        Vision                 Additional Comments: overshooting. Improved visual attention to objects but does not maintain visual fixation. Pt with increased eye contact with therapist today. Increased ability to reach for objexts in L visual field.   Perception  impaired   Praxis  impaired    Cognition   Behavior During Therapy: Flat affect Overall Cognitive Status: Impaired/Different from baseline Area of Impairment: Orientation;Following commands;Safety/judgement;Problem solving Orientation Level: Place;Time;Situation Current Attention Level: Sustained    Following Commands: Follows one step commands inconsistently Safety/Judgement: Decreased awareness of safety;Decreased awareness of deficits Awareness: Intellectual Problem Solving: Slow processing;Difficulty sequencing;Requires tactile cues General Comments: required increased time and multiple verbal and tactile cues to comprehend how to use RW to assist with hopping on R LE    Extremity/Trunk Assessment   generalized weakness  Exercises     Shoulder Instructions       General Comments      Pertinent Vitals/ Pain       Pain Assessment: Faces Faces Pain Scale: Hurts even more Pain Location: L LE with mvmt Pain Descriptors / Indicators: Grimacing Pain Intervention(s): Monitored  during session  Home Living                                          Prior Functioning/Environment              Frequency  Min 2X/week        Progress Toward Goals  OT Goals(current goals can now be found in the care plan section)  Progress towards OT goals: Progressing toward goals  Acute Rehab OT Goals Patient Stated Goal: none state OT Goal Formulation: Patient unable to participate in goal setting Time For Goal Achievement: 07/11/16 Potential to Achieve Goals: Good ADL Goals Pt Will Perform Eating: with set-up;with supervision;sitting Pt Will Perform Grooming: with set-up;with supervision;sitting Pt Will Perform Upper Body Bathing: with set-up;with supervision;bed level Pt Will Transfer to Toilet: with min assist;with +2 assist;bedside commode;squat pivot transfer Additional ADL Goal #1: Pt will demonstrate emergent awareness during ADL tasks with minimal redirectional cues in nondistracting environment.   Plan Discharge plan needs to be updated    Co-evaluation                 End of Session     Activity Tolerance Patient tolerated treatment well   Patient Left in chair;with call bell/phone within reach;with restraints reapplied   Nurse Communication Mobility status        Time: 9147-82951029-1054 OT Time Calculation (min): 25 min  Charges: OT General Charges $OT Visit: 1 Procedure OT Treatments $Self Care/Home Management : 23-37 mins  Graydon Fofana,HILLARY 07/02/2016, 1:28 PM   [facility] Tauni Sanks, OT/L  [phone] 07/02/2016

## 2016-07-02 NOTE — Progress Notes (Signed)
Physical Therapy Treatment Patient Details Name: Stephen ApleyJerome D Bowen MRN: [id number] DOB: 18-Jan-1956 Today's Date: 07/02/2016    History of Present Illness 61 y.o. male who [facility] of left ischemic foot secondary to occluded left femoral to posterior tibial bypass. He underwent thromboembolectomy of the bypass graft.  During the procedure the patient was having ST changes and cardiology was consulted. Pt with MI. The patient on awakening from anesthesia was aphasic and IP Code Stroke was called. CT 12/28 acute infarct involving a portion of the posterior mid to inferior right cerebellum as well as much of the left occipital lobe, particularly medially and mid portions. 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 PMHx: HTN, PAD    PT Comments    Pt tolerated ambulation well for first time but con't to require max v/c's and tactile cues due to impaired comprehension and cortical blindness. Pt remains confused as well. Acute PT to con't to follow.  Follow Up Recommendations  SNF     Equipment Recommendations  Rolling walker with 5" wheels    Recommendations for Other Services       Precautions / Restrictions Precautions Precautions: Fall Precaution Comments: L AKA, blind Restrictions Weight Bearing Restrictions: Yes LLE Weight Bearing: Non weight bearing    Mobility  Bed Mobility Overal bed mobility: Needs Assistance Bed Mobility: Supine to Sit     Supine to sit: Mod assist     General bed mobility comments: max verbal and tactile cues to sequence bringing R LE off EOB and to sit up EOB  Transfers Overall transfer level: Needs assistance Equipment used: Rolling walker (2 wheeled) (2 person lift) Transfers: Sit to/from [employer]GI CorporationStand;Stand Pivot Transfers Sit to Stand: Min assist;Mod assist Stand pivot transfers: Mod assist;+2 physical assistance;+2 safety/equipment       General transfer comment: pt modAx2 for initial std pvt to chair, pt then worked on standing from chair up to  3M CompanyW, verbal and tactile cues for hand placement, minA for powering up  Ambulation/Gait Ambulation/Gait assistance: +2 safety/equipment;Mod assist Ambulation Distance (Feet): 40 Feet Assistive device: Rolling walker (2 wheeled) Gait Pattern/deviations: Step-to pattern Gait velocity: slow Gait velocity interpretation: Below normal speed for age/gender General Gait Details: v/c's for sequencing (push down arms and hop on R LE) pt required increased time to comprehend but was then able to amb 40'   Stairs            Wheelchair Mobility    Modified Rankin (Stroke Patients Only)       Balance Overall balance assessment: Needs assistance Sitting-balance support: Single extremity supported Sitting balance-Leahy Scale: Fair     Standing balance support: Bilateral upper extremity supported Standing balance-Leahy Scale: Poor Standing balance comment: needs rw                    Cognition Arousal/Alertness: Awake/alert Behavior During Therapy: Flat affect Overall Cognitive Status: Impaired/Different from baseline Area of Impairment: Orientation;Following commands;Safety/judgement;Problem solving Orientation Level: Place;Time;Situation Current Attention Level: Sustained   Following Commands: Follows one step commands with increased time;Follows one step commands inconsistently Safety/Judgement: Decreased awareness of safety;Decreased awareness of deficits Awareness: Emergent ("i have to pee") Problem Solving: Slow processing;Decreased initiation;Difficulty sequencing;Requires verbal cues;Requires tactile cues General Comments: required increased time and multiple verbal and tactile cues to comprehend how to use RW to assist with hopping on R LE    Exercises      General Comments General comments (skin integrity, edema, etc.): pt with sores on R heel, L limb incision  with no drainage      Pertinent Vitals/Pain Pain Assessment: Faces Faces Pain Scale: Hurts even  more Pain Location: L LE with mvmt Pain Descriptors / Indicators: Grimacing Pain Intervention(s): Monitored during session    Home Living                      Prior Function            PT Goals (current goals can now be found in the care plan section) Acute Rehab PT Goals Patient Stated Goal: none state Progress towards PT goals: Progressing toward goals    Frequency    Min 3X/week      PT Plan Current plan remains appropriate;Frequency needs to be updated;Discharge plan needs to be updated    Co-evaluation             End of Session Equipment Utilized During Treatment: Gait belt Activity Tolerance: Patient tolerated treatment well Patient left: in chair;with call bell/phone within reach (mittens and posey belt on)     Time: 2956-2130 PT Time Calculation (min) (ACUTE ONLY): 27 min  Charges:  $Gait Training: 8-22 mins $Therapeutic Activity: 8-22 mins                    G Codes:      Rashelle Ireland M Kareen Jefferys Aug 01, 2016, 10:33 AM   Lewis Shock, PT, DPT Pager #: [phone] Office #: [phone]

## 2016-07-02 NOTE — Progress Notes (Signed)
Transferred patient with Marlyce HugeAlison Haggard, RN to [phone] via bed. All vitals stable. Patient belongings at beside. Will continue to monitor patient.  Horton ChinMacKayla A Lorenzo Arscott, RN

## 2016-07-03 DIAGNOSIS — I119 Hypertensive heart disease without heart failure: Secondary | ICD-10-CM

## 2016-07-03 LAB — BASIC METABOLIC PANEL
Anion gap: 9 (ref 5–15)
BUN: 10 mg/dL (ref 6–20)
CHLORIDE: 101 mmol/L (ref 101–111)
CO2: 27 mmol/L (ref 22–32)
CREATININE: 1.06 mg/dL (ref 0.61–1.24)
Calcium: 8.9 mg/dL (ref 8.9–10.3)
GFR calc Af Amer: >60 mL/min (ref >60)
GFR calc non Af Amer: >60 mL/min (ref >60)
Glucose, Bld: 113 mg/dL — ABNORMAL HIGH (ref 65–99)
POTASSIUM: 3.9 mmol/L (ref 3.5–5.1)
SODIUM: 137 mmol/L (ref 135–145)

## 2016-07-03 MED ORDER — AMLODIPINE BESYLATE 10 MG PO TABS
10.0000 mg | ORAL_TABLET | Freq: Every day | ORAL | Status: DC
  Administered 2016-07-04 – 2016-07-09 (×6): 10 mg via ORAL
  Filled 2016-07-03 (×7): qty 1

## 2016-07-03 MED ORDER — LORAZEPAM 2 MG/ML IJ SOLN
0.0000 mg | Freq: Two times a day (BID) | INTRAMUSCULAR | Status: DC | PRN
  Administered 2016-07-03: 2 mg via INTRAVENOUS
  Filled 2016-07-03: qty 1

## 2016-07-03 MED ORDER — AMLODIPINE BESYLATE 5 MG PO TABS
5.0000 mg | ORAL_TABLET | Freq: Once | ORAL | Status: AC
  Administered 2016-07-03: 5 mg via ORAL
  Filled 2016-07-03: qty 1

## 2016-07-03 NOTE — Progress Notes (Signed)
Speech Language Pathology Treatment: Dysphagia;Cognitive-Linquistic  Patient Details Name: Stephen Bowen MRN: [id number] DOB: [birth year] Today's Date: 07/03/2016 Time: 4696-29521053-1110 SLP Time Calculation (min) (ACUTE ONLY): 17 min  Assessment / Plan / Recommendation Clinical Impression  Pt is oriented to person only. He needs Mod-Max cues for initiation and completion of one-step commands during self-feeding task. Pt consumed soft solids with prolonged mastication and oral clearance, although improved from last SLP visit. Oral residual was minimal. Pt consumed multiple medications with thin liquid wash with brief oral holding but no further difficulty. Recommend advancing to Dys 2 diet, continuing thin liquids. Will continue to follow.    HPI HPI: Stephen ApleyJerome D Richman is a 61 y.o. male, who presents with two week history of progressively worsening left foot pain. Per chart had left femoral to PT artery bypass with propaten on 02/16/16. PMH: tobacco abuse, HT, PAD. On 12/28 s/p thromboembolectomy patient was having ST changes and cardiology was consulted. The patient on awakening from anesthesia was aphasic and IP Code Stroke was called. Patient was not administered IV t-PA. Head CT 06/21/16 showed evidence of acute infarct involving a portion of the posterior mid to inferior right cerebellum as well as much of the left occipital lobe, particularly medially and mid portions. 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      SLP Plan  Continue with current plan of care     Recommendations  Diet recommendations: Dysphagia 2 (fine chop);Thin liquid Liquids provided via: Cup;Straw Medication Administration: Whole meds with liquid Supervision: Staff to assist with self feeding Compensations: Minimize environmental distractions;Slow rate;Small sips/bites Postural Changes and/or Swallow Maneuvers: Seated upright 90 degrees                Oral Care Recommendations: Oral care BID Follow up Recommendations: Inpatient  Rehab Plan: Continue with current plan of care       GO                Maxcine Hamaiewonsky, Amey Hossain 07/03/2016, 12:23 PM  Maxcine HamLaura Paiewonsky, M.A. CCC-SLP [phone]

## 2016-07-03 NOTE — Progress Notes (Signed)
  Progress Note    07/03/2016 8:53 AM 8 Days Post-Op  Subjective:  Remains confused and agitated overnigh  Vitals:   07/03/16 0700 07/03/16 0800  BP: (!) 161/104 (!) 155/81  Pulse: 76 84  Resp: 16 14  Temp:  97.6 F (36.4 C)    Physical Exam: Disoriented Abdomen is soft Left residual limb site cdi with staples R foot is warm and motor in tact  CBC    Component Value Date/Time   WBC 14.8 (H) 06/29/2016 0908   RBC 3.65 (L) 06/29/2016 0908   HGB 11.0 (L) 06/29/2016 0908   HCT 33.4 (L) 06/29/2016 0908   PLT 766 (H) 06/29/2016 0908   MCV 91.5 06/29/2016 0908   MCH 30.1 06/29/2016 0908   MCHC 32.9 06/29/2016 0908   RDW 15.3 06/29/2016 0908   LYMPHSABS 1.1 02/13/2016 1519   MONOABS 0.6 02/13/2016 1519   EOSABS 0.0 02/13/2016 1519   BASOSABS 0.0 02/13/2016 1519    BMET    Component Value Date/Time   NA 137 07/03/2016 0312   K 3.9 07/03/2016 0312   CL 101 07/03/2016 0312   CO2 27 07/03/2016 0312   GLUCOSE 113 (H) 07/03/2016 0312   BUN 10 07/03/2016 0312   CREATININE 1.06 07/03/2016 0312   CREATININE 0.92 11/13/2013 1031   CALCIUM 8.9 07/03/2016 0312   GFRNONAA >60 07/03/2016 0312   GFRNONAA >89 11/13/2013 1031   GFRAA >60 07/03/2016 0312   GFRAA >89 11/13/2013 1031    INR    Component Value Date/Time   INR 1.07 06/25/2016 0925     Intake/Output Summary (Last 24 hours) at 07/03/16 0853 Last data filed at 07/03/16 0800  Gross per 24 hour  Intake              480 ml  Output             1250 ml  Net             -770 ml     Assessment:  61 y.o. male is s/p left aka following attempted lle revascularization complicated by MI and CVA, cardiology following.   Plan: -asa and plavix -cardiology following htn now better controlled -will need snf at discharge -transfer to 2W when bed available -DVT prophylaxis:  Subq heparin   Ermal Haberer C. Randie Heinzain, MD [facility] Office: [phone] Pager: [phone]  07/03/2016 8:53  AM

## 2016-07-03 NOTE — Progress Notes (Addendum)
NCM contacted Financial Counselor to follow up on Medicaid application and disability. Spoke to [place], states they will review case and follow up with Medicaid and disability. Attempted call to brother, Allyson Saballford Salim with update. No voicemail to leave message. Isidoro DonningAlesia Kayonna Lawniczak RN CCM Case Mgmt phone [phone]   07/03/2016 1653 NCM spoke to pt's brother, Allyson Saballford Athanas. Explained pt's brother that Financial Counselor, Quita Skyeoni Harris # [phone] will speak to him on tomorrow to discussed Medicaid and SS Disability. Plan is dc to SNF. Spoke to CSW to [facility] up on [facility]. [facility], states facility [place] a few minutes from his home. Isidoro DonningAlesia Danikah Budzik RN CCM Case Mgmt phone [phone]

## 2016-07-03 NOTE — Progress Notes (Signed)
Patient Name: Stephen Bowen Date of Encounter: 07/03/2016  Primary Cardiologist: Dr. 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  Denies chest pain or shortness of breath  Inpatient Medications    Scheduled Meds: . amLODipine  5 mg Oral Daily  . aspirin EC  81 mg Oral Daily  . atorvastatin  80 mg Oral q1800  . clopidogrel  75 mg Oral Daily  . folic acid  1 mg Oral Daily  . heparin  5,000 Units Subcutaneous Q8H  . isosorbide mononitrate  60 mg Oral Daily  . mouth rinse  15 mL Mouth Rinse BID  . metoprolol tartrate  125 mg Oral BID  . multivitamin with minerals  1 tablet Oral Daily  . pantoprazole  40 mg Oral Daily  . polyethylene glycol  17 g Oral Daily  . sodium chloride flush  3 mL Intravenous Q12H  . spironolactone  25 mg Oral Daily  . thiamine  100 mg Oral Daily   Continuous Infusions:  PRN Meds: sodium chloride, acetaminophen **OR** acetaminophen, alum & mag hydroxide-simeth, bisacodyl, bisacodyl, guaiFENesin-dextromethorphan, hydrALAZINE, HYDROmorphone (DILAUDID) injection, LORazepam, morphine injection, ondansetron, oxyCODONE-acetaminophen, phenol, sodium chloride flush, sodium phosphate   Vital Signs    Vitals:   07/03/16 1000 07/03/16 1100 07/03/16 1200 07/03/16 1209  BP: (!) 142/83 (!) 155/94 (!) 147/87   Pulse: 95 (!) 105 94   Resp: 18 (!) 22 17   Temp:    98.6 F (37 C)  TempSrc:    Oral  SpO2: 97% 97% 98%   Weight:      Height:        Intake/Output Summary (Last 24 hours) at 07/03/16 1252 Last data filed at 07/03/16 1100  Gross per 24 hour  Intake              840 ml    Output              900 ml  Net              -60 ml   Filed Weights   06/20/16 1027  Weight: 55.5 kg (122 lb 5.7 oz)    Physical Exam    GEN: Malnourished, well developed, in no acute distress.   HEENT: Grossly normal.  Neck: Supple, no JVD, carotid bruits, or masses. Cardiac: RRR, no murmurs, rubs, or gallops.  Extremities: Mittens in place.  L AKA.  No clubbing, cyanosis, edema.   Respiratory:  Respirations regular and unlabored, clear to auscultation bilaterally. GI: Soft, nontender, nondistended, BS + x 4. MS: no deformity or atrophy. Skin: warm and dry, no rash. Neuro:  Strength and sensation are intact. Psych: AAOx3.  Normal affect.  Labs    CBC No results for input(s): WBC, NEUTROABS, HGB, HCT, MCV, PLT in the last 72 hours. Basic Metabolic Panel  Recent Labs  07/02/16 0237 07/03/16 0312  NA 137 137  K 4.1 3.9  CL 99* 101  CO2 28 27  GLUCOSE 112* 113*  BUN 9 10  CREATININE 1.11 1.06  CALCIUM  9.0 8.9   Liver Function Tests No results for input(s): AST, ALT, ALKPHOS, BILITOT, PROT, ALBUMIN in the last 72 hours. No results for input(s): LIPASE, AMYLASE in the last 72 hours. Cardiac Enzymes No results for input(s): CKTOTAL, CKMB, CKMBINDEX, TROPONINI in the last 72 hours. BNP Invalid input(s): POCBNP D-Dimer No results for input(s): DDIMER in the last 72 hours. Hemoglobin A1C No results for input(s): HGBA1C in the last 72 hours. Fasting Lipid Panel No results for input(s): CHOL, HDL, LDLCALC, TRIG, CHOLHDL, LDLDIRECT in the last 72 hours. Thyroid Function Tests  Recent Labs  06/30/16 1439  TSH 1.162    Telemetry    No events  - Personally Reviewed  ECG    N/a - Personally Reviewed  Radiology    No results found.  Cardiac Studies   Echo 06/23/16: Study Conclusions  - Left ventricle: The cavity size was normal. Wall thickness was   increased in a pattern of moderate LVH. Systolic function was   normal. The estimated ejection  fraction was in the range of 60%   to 65%. There is akinesis of the apicalanteroseptal and   inferolateral myocardium. Features are consistent with a   pseudonormal left ventricular filling pattern, with concomitant   abnormal relaxation and increased filling pressure (grade 2   diastolic dysfunction).  Impressions:  - Since last echo, apical wall motion abnormality is new.  Patient Profile     Mr. Bouwman is a 75M with PAD and CLI s/p L AKA, hypertension who had transient anterior ST elevations and an acute stroke during surgical thrombectomy of the left lower extremity.   Assessment & Plan    # Acute anterior STEMI: Mr. Spellman had an aborted STEMI with ST elevations during surgery.  He has not had any symptoms of ischemia since that time and currently denies chest pain.  Troponin was elevated to >65.  His echocardiogram shows LVEF 60-65% with akinesis of the apical anteroseptal and inferolateral myocardium.  He remains somewhat confused and still has a Armed forces technical officer in place.  Given his lack of symptoms and preserved LV function we will plan for medical management with outpatient stress testing vs. Cath when stable. Continue aspirin, clopidogrel, metoprolol and Imdur.  He is not on a statin due to total cholesterol of 77 and LDL 37.  # Hypertension: BP is above goal.  We will increase amlodipine to 10 mg daily. Continue metoprolol and spironolactone.  He was on HCTZ/lisinopril as an outpatient.    # PAD: # L AKA:  Per vascular surgery.  Continue aspirin and clopidogrel.   Signed, Chilton Si, MD  07/03/2016, 12:52 PM

## 2016-07-04 LAB — BASIC METABOLIC PANEL
ANION GAP: 11 (ref 5–15)
BUN: 8 mg/dL (ref 6–20)
CO2: 25 mmol/L (ref 22–32)
Calcium: 9.2 mg/dL (ref 8.9–10.3)
Chloride: 98 mmol/L — ABNORMAL LOW (ref 101–111)
Creatinine, Ser: 0.93 mg/dL (ref 0.61–1.24)
GFR calc Af Amer: >60 mL/min (ref >60)
GLUCOSE: 109 mg/dL — AB (ref 65–99)
POTASSIUM: 4.3 mmol/L (ref 3.5–5.1)
Sodium: 134 mmol/L — ABNORMAL LOW (ref 135–145)

## 2016-07-04 NOTE — Progress Notes (Signed)
Pt received from ICU. Pt alert to self. VSS. Telemetry applied, CCMD notified x2.   Soft wrist restraints not applied as pt is resting quietly. Soft wrist restraint order expires 1/11 at 1147. Mittens applied. Posey belt in place, but not secured per order.   Leonidas Rombergaitlin S Bumbledare, RN

## 2016-07-04 NOTE — Progress Notes (Addendum)
  [facility] Progress Note  Subjective  - POD #9  No complaints. Denies pain, chest pain, shortness of breath.   Objective Vitals:   07/04/16 0600 07/04/16 0700  BP: (!) 147/83 140/84  Pulse: 86 87  Resp: 18 15  Temp:      Intake/Output Summary (Last 24 hours) at 07/04/16 0732 Last data filed at 07/04/16 0400  Gross per 24 hour  Intake             1040 ml  Output             1150 ml  Net             -110 ml   Oriented only to self.  Left AKA healing well.  Right foot warm with motor and sensory function intact.    Assessment/Planning: 61 y.o. male is s/p: left aka following attempted lle revascularization complicated by MI and CVA, cardiology following.  9 Days Post-Op   Left AKA healing well. Continue ASA and plavix.  Hypertension improving. Appreciate cardiology assistance. STEMI: per cardiology.  Transfer to 2W. Continue PT and OT.  SNF at discharge.   Raymond GurneyKimberly A Trinh 07/04/2016 7:32 AM --  Laboratory CBC    Component Value Date/Time   WBC 14.8 (H) 06/29/2016 0908   HGB 11.0 (L) 06/29/2016 0908   HCT 33.4 (L) 06/29/2016 0908   PLT 766 (H) 06/29/2016 0908    BMET    Component Value Date/Time   NA 134 (L) 07/04/2016 0349   K 4.3 07/04/2016 0349   CL 98 (L) 07/04/2016 0349   CO2 25 07/04/2016 0349   GLUCOSE 109 (H) 07/04/2016 0349   BUN 8 07/04/2016 0349   CREATININE 0.93 07/04/2016 0349   CREATININE 0.92 11/13/2013 1031   CALCIUM 9.2 07/04/2016 0349   GFRNONAA >60 07/04/2016 0349   GFRNONAA >89 11/13/2013 1031   GFRAA >60 07/04/2016 0349   GFRAA >89 11/13/2013 1031    COAG Lab Results  Component Value Date   INR 1.07 06/25/2016   INR 0.97 06/19/2016   INR 0.92 06/19/2016   No results found for: PTT  Antibiotics Anti-infectives    Start     Dose/Rate Route Frequency Ordered Stop   06/25/16 0730  ceFAZolin (ANCEF) IVPB 2g/100 mL premix  Status:  Discontinued    Comments:  Send with pt to OR   2 g 200 mL/hr over 30  Minutes Intravenous To ShortStay Surgical 06/25/16 0420 06/25/16 1005   06/20/16 1730  ceFAZolin (ANCEF) IVPB 2g/100 mL premix     2 g 200 mL/hr over 30 Minutes Intravenous Every 8 hours 06/20/16 1621 06/20/16 1814       Maris BergerKimberly Trinh, PA-C [facility] Office: [phone] Pager: [phone] 07/04/2016 7:32 AM   I have independently interviewed patient and agree with PA assessment and plan above.   Brandon C. Randie Heinzain, MD [facility] [place] Office: [phone] Pager: [phone]

## 2016-07-04 NOTE — Progress Notes (Signed)
CSW contacted financial counselor Leavy CellaJasmine and spoke with her via phone. Leavy CellaJasmine stated that the person doing patient medicaid application is currently out for the day but will be back tomorrow 07/05/10. Jasmine stated Quita Skyeoni Harris will reach out to patient and family to discuss medicaid application.   Marrianne MoodAshley Trinka Keshishyan, MSW,  [employer] [phone]

## 2016-07-04 NOTE — Progress Notes (Signed)
CSW spoke to patients brother (alford) via phone. Oneta Racklford stated he needed assistance in apply for medicaid and disability for patient. CSW gave him information on how to apply for insurance and disability. CSW contacted financial counselor Quita Skyeoni Harris [phone]) to reach out to her and inform her that patients brother needed some assistance. CSW left voicemail for Ms. Harris to contact her back. CSW remains available for support and discharge needs.   Marrianne MoodAshley Edelmira Gallogly, MSW,  [employer] [phone]

## 2016-07-04 NOTE — Care Management Note (Signed)
Case Management Note  Patient Details  Name: Stephen Bowen MRN: [id number] Date of Birth: May 27, 1956  Subjective/Objective:  CM talked with brother, Stephen Bowen [phone]), who will assist with disability and Medicaid application.  Also talked with financial counselor who has contact information and will contact brother and arrange meeting to complete applications.                      Expected Discharge Plan:  Skilled Nursing Facility  In-House Referral:  Clinical Social Work, Museum/gallery exhibitions officerinancial Counselor  Discharge planning Services  CM Consult  Status of Service:  In process, will continue to follow  Magdalene RiverMayo, Lizett Chowning T, RN 07/04/2016, 10:35 AM

## 2016-07-04 NOTE — Progress Notes (Signed)
Speech Language Pathology Treatment: Dysphagia;Cognitive-Linquistic  Patient Details Name: Stephen Bowen MRN: [id number] DOB: 10-Mar-1956 Today's Date: 07/04/2016 Time: [id number] SLP Time Calculation (min) (ACUTE ONLY): 9 min  Assessment / Plan / Recommendation Clinical Impression  Pt consumed thin liquids by straw with no overt difficulty, but declined solid POs by shaking his hand at them and saying, "I'm good." Remainder of tx focused on functional communication skills. Pt needed Max visual/gestural cues for completion of one-step commands not within a functional context. He counted from 1-10 with Max cues from SLP, but then perseverated on numbers when trying to attempt additional automatic speech tasks. Will continue to follow.    HPI HPI: Stephen Bowen is a 61 y.o. male, who presents with two week history of progressively worsening left foot pain. Per chart had left femoral to PT artery bypass with propaten on 02/16/16. PMH: tobacco abuse, HT, PAD. On 12/28 s/p thromboembolectomy patient was having ST changes and cardiology was consulted. The patient on awakening from anesthesia was aphasic and IP Code Stroke was called. Patient was not administered IV t-PA. Head CT 06/21/16 showed evidence of acute infarct involving a portion of the posterior mid to inferior right cerebellum as well as much of the left occipital lobe, particularly medially and mid portions. 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      SLP Plan  Continue with current plan of care     Recommendations  Diet recommendations: Dysphagia 2 (fine chop);Thin liquid Liquids provided via: Cup;Straw Medication Administration: Whole meds with liquid Supervision: Staff to assist with self feeding Compensations: Minimize environmental distractions;Slow rate;Small sips/bites Postural Changes and/or Swallow Maneuvers: Seated upright 90 degrees                Oral Care Recommendations: Oral care BID Follow up Recommendations:  Inpatient Rehab Plan: Continue with current plan of care       GO                Stephen Hamaiewonsky, Karanvir Balderston 07/04/2016, 4:24 PM  Stephen Bowen, M.A. CCC-SLP [phone]

## 2016-07-04 NOTE — Progress Notes (Signed)
Physical Therapy Treatment Patient Details Name: Stephen Bowen MRN: [id number] DOB: 1956/01/19 Today's Date: 07/04/2016    History of Present Illness 61 y.o. male who presented to [facility] for management of left ischemic foot secondary to occluded left femoral to posterior tibial bypass. He underwent thromboembolectomy of the bypass graft.  During the procedure the patient was having ST changes and cardiology was consulted. Pt with MI. The patient on awakening from anesthesia was aphasic and IP Code Stroke was called. CT 12/28 acute infarct involving a portion of the posterior mid to inferior right cerebellum as well as much of the left occipital lobe, particularly medially and mid portions. 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 PMHx: HTN, PAD    PT Comments    Pt mobilizing to EOB with min-guard A and ambulated 20' with RW and min A +2 for safety and chair behind. Pt fatigues quickly. Discussed positioning of LE's and preparation for prosthesis if/ when possible. PT will continue to follow.   Follow Up Recommendations  SNF     Equipment Recommendations  Rolling walker with 5" wheels    Recommendations for Other Services       Precautions / Restrictions Precautions Precautions: Fall Precaution Comments: L AKA, blind, right heel wounds Restrictions Weight Bearing Restrictions: Yes LLE Weight Bearing: Non weight bearing    Mobility  Bed Mobility Overal bed mobility: Needs Assistance Bed Mobility: Supine to Sit     Supine to sit: Min guard     General bed mobility comments: verbal and tactile cues for safe movement to EOB, pt did not require physical assistance but min-guard for safety  Transfers Overall transfer level: Needs assistance   Transfers: Sit to/from Stand Sit to Stand: Min assist         General transfer comment: min A from bed and chair, tactile cues for safe hand placement each time.   Ambulation/Gait Ambulation/Gait assistance: +2 safety/equipment;Min  assist Ambulation Distance (Feet): 20 Feet Assistive device: Rolling walker (2 wheeled) Gait Pattern/deviations: Step-to pattern Gait velocity: decreased   General Gait Details: pt fatigued after 20' and needed to sit and then felt too fatigued to ambulate again.    Stairs            Wheelchair Mobility    Modified Rankin (Stroke Patients Only) Modified Rankin (Stroke Patients Only) Pre-Morbid Rankin Score: No symptoms Modified Rankin: Moderately severe disability     Balance Overall balance assessment: Needs assistance Sitting-balance support: Single extremity supported Sitting balance-Leahy Scale: Fair Sitting balance - Comments: pt with forward lean down to feet with decreased ability to control wt-shift, min A given for safety.    Standing balance support: Bilateral upper extremity supported Standing balance-Leahy Scale: Poor Standing balance comment: reliant on RW                    Cognition Arousal/Alertness: Awake/alert Behavior During Therapy: WFL for tasks assessed/performed Overall Cognitive Status: Impaired/Different from baseline Area of Impairment: Memory;Following commands;Safety/judgement     Memory: Decreased short-term memory;Decreased recall of precautions Following Commands: Follows one step commands inconsistently Safety/Judgement: Decreased awareness of safety;Decreased awareness of deficits   Problem Solving: Difficulty sequencing;Requires verbal cues General Comments: pt initiating mobility more quickly, needs vc's for sequencing    Exercises      General Comments General comments (skin integrity, edema, etc.): pt with RLE propped to manage R heel pain. Mittens left off for pt to eat lunch, RN aware and agreeable      Pertinent Vitals/Pain Pain  Assessment: Faces Faces Pain Scale: Hurts little more Pain Location: right heel Pain Descriptors / Indicators: Grimacing Pain Intervention(s): Limited activity within patient's  tolerance;Monitored during session    Home Living                      Prior Function            PT Goals (current goals can now be found in the care plan section) Acute Rehab PT Goals Patient Stated Goal: none stated PT Goal Formulation: With patient Time For Goal Achievement: 07/11/16 Potential to Achieve Goals: Good Progress towards PT goals: Progressing toward goals    Frequency    Min 3X/week      PT Plan Current plan remains appropriate    Co-evaluation             End of Session Equipment Utilized During Treatment: Gait belt Activity Tolerance: Patient tolerated treatment well Patient left: in chair;with chair alarm set;with call bell/phone within reach     Time: [id number] PT Time Calculation (min) (ACUTE ONLY): 23 min  Charges:  $Gait Training: 8-22 mins $Therapeutic Activity: 8-22 mins                    G Codes:     Stephen Bowen, PT  Acute Rehab Services  [phone]  Stephen Bowen 07/04/2016, 3:48 PM

## 2016-07-05 DIAGNOSIS — E7800 Pure hypercholesterolemia, unspecified: Secondary | ICD-10-CM

## 2016-07-05 LAB — CBC
HCT: 33.4 % — ABNORMAL LOW (ref 39.0–52.0)
HEMOGLOBIN: 10.9 g/dL — AB (ref 13.0–17.0)
MCH: 30.4 pg (ref 26.0–34.0)
MCHC: 32.6 g/dL (ref 30.0–36.0)
MCV: 93.3 fL (ref 78.0–100.0)
PLATELETS: 834 10*3/uL — AB (ref 150–400)
RBC: 3.58 MIL/uL — AB (ref 4.22–5.81)
RDW: 15.3 % (ref 11.5–15.5)
WBC: 16.9 10*3/uL — AB (ref 4.0–10.5)

## 2016-07-05 MED ORDER — LISINOPRIL 10 MG PO TABS
10.0000 mg | ORAL_TABLET | Freq: Every day | ORAL | Status: DC
  Administered 2016-07-05 – 2016-07-09 (×5): 10 mg via ORAL
  Filled 2016-07-05 (×5): qty 1

## 2016-07-05 MED ORDER — OXYCODONE-ACETAMINOPHEN 5-325 MG PO TABS: 1.0000 | ORAL_TABLET | Freq: Four times a day (QID) | ORAL | 0 refills | Status: DC | PRN

## 2016-07-05 NOTE — Progress Notes (Signed)
Clinical Social Worker is still following patients for support and discharge needs. CSW contacted Quita Skyeoni Harris ([facility] ) and left voicemail stating that patients brother has been contacting CSW and Nurse Case manager for assistance in applying for medicaid and disability for patient. CSW left contact number for patients brother on voicemail.   Marrianne MoodAshley Amair Shrout, MSW,  [employer] [phone]

## 2016-07-05 NOTE — Progress Notes (Signed)
Pt continuing to have persistent nose bleed from R nostril, large clots noted.  Newton PiggSamantha Ellington, PA notified.  Advised to use rhino-rocket.  Ordered from [employer] mgt. For now, R nare packed with gauze.

## 2016-07-05 NOTE — Progress Notes (Signed)
Hgb stable since last blood draw on 06/29/16.  Will hold SQ heparin for nose bleed.  Hold pressure for 10-15 minutes as well.   Doreatha MassedSamantha Joycelynn Fritsche, [facility] 07/05/2016 10:27 AM\

## 2016-07-05 NOTE — Progress Notes (Addendum)
  Progress Note    07/05/2016 7:39 AM 10 Days Post-Op  Subjective:  No complaints; he did have a bloody nose this morning  Afebrile HR  80's NSR 110's-160's systolic 100% RA  Vitals:   07/04/16 1932 07/05/16 0434  BP: 136/81 (!) 160/80  Pulse: 87 87  Resp: 18 18  Temp: 98.2 F (36.8 C) 98 F (36.7 C)    Physical Exam: Lungs:  Non labored Incisions:  Left stump healing nicely   CBC    Component Value Date/Time   WBC 14.8 (H) 06/29/2016 0908   RBC 3.65 (L) 06/29/2016 0908   HGB 11.0 (L) 06/29/2016 0908   HCT 33.4 (L) 06/29/2016 0908   PLT 766 (H) 06/29/2016 0908   MCV 91.5 06/29/2016 0908   MCH 30.1 06/29/2016 0908   MCHC 32.9 06/29/2016 0908   RDW 15.3 06/29/2016 0908   LYMPHSABS 1.1 02/13/2016 1519   MONOABS 0.6 02/13/2016 1519   EOSABS 0.0 02/13/2016 1519   BASOSABS 0.0 02/13/2016 1519    BMET    Component Value Date/Time   NA 134 (L) 07/04/2016 0349   K 4.3 07/04/2016 0349   CL 98 (L) 07/04/2016 0349   CO2 25 07/04/2016 0349   GLUCOSE 109 (H) 07/04/2016 0349   BUN 8 07/04/2016 0349   CREATININE 0.93 07/04/2016 0349   CREATININE 0.92 11/13/2013 1031   CALCIUM 9.2 07/04/2016 0349   GFRNONAA >60 07/04/2016 0349   GFRNONAA >89 11/13/2013 1031   GFRAA >60 07/04/2016 0349   GFRAA >89 11/13/2013 1031    INR    Component Value Date/Time   INR 1.07 06/25/2016 0925     Intake/Output Summary (Last 24 hours) at 07/05/16 0739 Last data filed at 07/05/16 0434  Gross per 24 hour  Intake              222 ml  Output              750 ml  Net             -528 ml     Assessment:  61 y.o. male is s/p:  1.  Thromboembolectomy of left femoral to PT bypass graft 2.  Harvest of left small saphenous vein 3.  Bypass from existing graft to distal PT artery with 6mm ringed ptfe with distal vein patch 4.  Left lower extremity angiogram 14 Days Post-Op And   Left AKA 10 Days Post-Op   Plan: -pt's left stump healing nicely -evaluation by speech-rec  dysphagia 2 (fine chop); thin liquid -evaluation by PT:  Recommends SNF and RW with 5" wheels -STEMI management per cardiology.  BP better controlled-he did have systolic pressure this am of 160 -DVT prophylaxis:  Heparin SQ -to SNF soon if okay with cardiology    Doreatha MassedSamantha Rhyne, PA-C [facility] [phone] 07/05/2016 7:39 AM  I have independently interviewed patient and agree with PA assessment and plan above. Cbc ordered for nose bleed and right groin hematoma. Will get to SNF when bed available and ok per cardiology.   Anice Wilshire C. Randie Heinzain, MD [facility] [place] Office: [phone] Pager: [phone]

## 2016-07-05 NOTE — Consult Note (Signed)
Reason for Consult: Nosebleeds Referring Physician: Waynetta Bowen*  Stephen Bowen is an 61 y.o. male.  HPI: Persistent nosebleed from the right side since yesterday.  Past Medical History:  Diagnosis Date  . HTN (hypertension)   . Noncompliance   . PAD (peripheral artery disease) ([place])    a.  s/p left femoral to PT artery bypass with propaten on 02/16/16.    Past Surgical History:  Procedure Laterality Date  . AMPUTATION Left 06/25/2016   Procedure: AMPUTATION ABOVE KNEE;  Surgeon: Stephen Mitchell, MD;  Location: [place];  Service: Vascular;  Laterality: Left;  . BYPASS GRAFT POPLITEAL TO TIBIAL Left 06/21/2016   Procedure: BYPASS GRAFT POPLITEAL TO TIBIAL USING GORE PROPATEN GRAFT;  Surgeon: Stephen Sandy, MD;  Location: [place];  Service: Vascular;  Laterality: Left;  . EMBOLECTOMY Left 06/21/2016   Procedure: EMBOLECTOMY left lower extremity.;  Surgeon: Stephen Sandy, MD;  Location: [place];  Service: Vascular;  Laterality: Left;  . ENDARTERECTOMY FEMORAL Left 02/16/2016   Procedure: LEFT FEMORAL ENDARTERECTOMY;  Surgeon: Stephen Sandy, MD;  Location: [place];  Service: Vascular;  Laterality: Left;  . FEMORAL BYPASS  02/16/2016   LEFT FEMORAL-POSTERIOR TIBIAL ARTERY BYPASS  WITH PROPATEN 6 MM X 80 CM VASCULAR RING GRAFT (Left)  . FEMORAL-TIBIAL BYPASS GRAFT Left 02/16/2016   Procedure: LEFT FEMORAL-POSTERIOR TIBIAL ARTERY BYPASS  WITH PROPATEN 6 MM X 80 CM VASCULAR RING GRAFT;  Surgeon: Stephen Sandy, MD;  Location: [place];  Service: Vascular;  Laterality: Left;  . PATCH ANGIOPLASTY Left 02/16/2016   Procedure: PATCH ANGIOPLASTY WITH Stephen Bowen BIOLOGIC PATCH 1 CM X 6 CM;  Surgeon: Stephen Sandy, MD;  Location: [place];  Service: Vascular;  Laterality: Left;  . PERIPHERAL VASCULAR CATHETERIZATION N/A 07/07/2015   Procedure: Abdominal Aortogram w/Lower Extremity;  Surgeon: Stephen [place], MD;  Location: [place] [facility];  Service:  Cardiovascular;  Laterality: N/A;  . PERIPHERAL VASCULAR CATHETERIZATION N/A 02/15/2016   Procedure: Abdominal Aortogram w/Lower Extremity;  Surgeon: Stephen Sandy, MD;  Location: [place] [facility];  Service: Cardiovascular;  Laterality: N/A;  . PERIPHERAL VASCULAR CATHETERIZATION Bilateral 06/20/2016   Procedure: Lower Extremity Angiography;  Surgeon: Stephen Sandy, MD;  Location: [place] [facility];  Service: Cardiovascular;  Laterality: Bilateral;  limited runoff on rt leg thrombolysis lt leg bypass graft  . 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    Family History  Problem Relation Age of Onset  . Cancer Mother     deceased in 86s   . Heart attack Mother     diseased  . Heart disease Father   . Stroke Sister     age 61s  . Heart disease Brother     pacemaker in his 77s    Social History:  reports that he has been smoking Cigarettes.  He has a 20.00 pack-year smoking history. He has never used smokeless tobacco. He reports that he drinks about 8.4 oz of alcohol per week . He reports that he does not use drugs.  Allergies: No Known Allergies  Medications: Reviewed  Results for orders placed or performed during the hospital encounter of 06/19/16 (from the past 48 hour(s))  Basic metabolic panel     Status: Abnormal   Collection Time: 07/04/16  3:49 AM  Result Value Ref Range   Sodium 134 (L) 135 - 145 mmol/L   Potassium 4.3 3.5 - 5.1  mmol/L   Chloride 98 (L) 101 - 111 mmol/L   CO2 25 22 - 32 mmol/L   Glucose, Bld 109 (H) 65 - 99 mg/dL   BUN 8 6 - 20 mg/dL   Creatinine, Ser 0.93 0.61 - 1.24 mg/dL   Calcium 9.2 8.9 - 10.3 mg/dL   GFR calc non Af Amer >60 >60 mL/min   GFR calc Af Amer >60 >60 mL/min    Comment: (NOTE) The eGFR has been calculated using the CKD EPI equation. This calculation has not been validated in all clinical situations. eGFR's persistently <60 mL/min signify possible Chronic  Kidney Disease.    Anion gap 11 5 - 15  CBC     Status: Abnormal   Collection Time: 07/05/16  9:53 AM  Result Value Ref Range   WBC 16.9 (H) 4.0 - 10.5 K/uL   RBC 3.58 (L) 4.22 - 5.81 MIL/uL   Hemoglobin 10.9 (L) 13.0 - 17.0 g/dL   HCT 33.4 (L) 39.0 - 52.0 %   MCV 93.3 78.0 - 100.0 fL   MCH 30.4 26.0 - 34.0 pg   MCHC 32.6 30.0 - 36.0 g/dL   RDW 15.3 11.5 - 15.5 %   Platelets 834 (H) 150 - 400 K/uL    No results found.  FEX:[id number] except as listed in admit H&P  Blood pressure 133/80, pulse 86, temperature 97.8 F (36.6 C), temperature source Oral, resp. rate 18, height _0  (1.727 m), weight 55.5 kg (122 lb 5.7 oz), SpO2 100 %.  PHYSICAL EXAM: Overall appearance:  Chronically ill appearing, in no distress Head:  Normocephalic, atraumatic. Ears: External ears look healthy. Nose: External nose is healthy in appearance. Internal nasal exam free of any masses. There is a severe leftward septal deviation. Gauze was removed from the right nasal cavity. Oral Cavity/Pharynx:  There are no mucosal lesions or masses identified. Few remaining dentition in poor shape. Larynx/Hypopharynx: Deferred Neuro:  No identifiable neurologic deficits. Neck: No palpable neck masses.  Studies Reviewed: none  Procedures: Nasal cautery. Topical Xylocaine/Afrin was applied to the right nasal cavity and cotton pledgets. Thorough inspection of the nasal cavity revealed the bleeding site that was identified in the right anterior septum. This was cauterized with silver nitrate. He tolerated this well. There was no further bleeding.   Assessment/Plan: Severe nasal septal deviation, anterior septal bleeding site identified and cauterized. Call if there is any additional problems.  Stephen Bowen 07/05/2016, 5:31 PM

## 2016-07-05 NOTE — Progress Notes (Signed)
Pt having persistent nose bleeds in R nostril this morning and throughout the night.  Doreatha MassedSamantha Rhyne, PA notified.  Received order to apply pressure for 10-1615min and hold sq heparin, but to administer a.m. plavix.

## 2016-07-05 NOTE — Progress Notes (Signed)
Patient Name: Stephen Bowen Date of Encounter: 07/05/2016  Primary Cardiologist: Dr. 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  Denies chest pain or shortness of breath  Inpatient Medications    Scheduled Meds: . amLODipine  10 mg Oral Daily  . aspirin EC  81 mg Oral Daily  . atorvastatin  80 mg Oral q1800  . clopidogrel  75 mg Oral Daily  . folic acid  1 mg Oral Daily  . heparin  5,000 Units Subcutaneous Q8H  . isosorbide mononitrate  60 mg Oral Daily  . lisinopril  10 mg Oral Daily  . mouth rinse  15 mL Mouth Rinse BID  . metoprolol tartrate  125 mg Oral BID  . multivitamin with minerals  1 tablet Oral Daily  . pantoprazole  40 mg Oral Daily  . polyethylene glycol  17 g Oral Daily  . sodium chloride flush  3 mL Intravenous Q12H  . spironolactone  25 mg Oral Daily  . thiamine  100 mg Oral Daily   Continuous Infusions:  PRN Meds: sodium chloride, acetaminophen **OR** acetaminophen, alum & mag hydroxide-simeth, bisacodyl, bisacodyl, guaiFENesin-dextromethorphan, hydrALAZINE, HYDROmorphone (DILAUDID) injection, LORazepam, morphine injection, ondansetron, oxyCODONE-acetaminophen, phenol, sodium chloride flush, sodium phosphate   Vital Signs    Vitals:   07/04/16 1307 07/04/16 1932 07/05/16 0434 07/05/16 1014  BP: 117/73 136/81 (!) 160/80 (!) 151/103  Pulse: 78 87 87 94  Resp: 18 18 18    Temp: 97.6 F (36.4 C) 98.2 F (36.8 C) 98 F (36.7 C) 98.8 F (37.1 C)  TempSrc: Oral Oral Oral Oral  SpO2: 98% 100% 100% 100%  Weight:      Height:        Intake/Output Summary (Last 24 hours) at  07/05/16 1327 Last data filed at 07/05/16 0434  Gross per 24 hour  Intake              222 ml  Output              300 ml  Net              -78 ml   Filed Weights   06/20/16 1027  Weight: 55.5 kg (122 lb 5.7 oz)    Physical Exam    GEN: Malnourished, well developed, in no acute distress.   HEENT: Grossly normal. Epistaxis from R nare Neck: Supple, no JVD, carotid bruits, or masses. Cardiac: RRR, no murmurs, rubs, or gallops.  Extremities: L AKA.  No clubbing, cyanosis, edema.   Respiratory:  Respirations regular and unlabored, clear to auscultation bilaterally. GI: Soft, nontender, nondistended, BS + x 4. MS: no deformity or atrophy. Skin: warm and dry, no rash. Neuro:  Strength and sensation are intact. Psych: AAOx3.  Normal affect.  Labs    CBC  Recent Labs  07/05/16 0953  WBC 16.9*  HGB 10.9*  HCT 33.4*  MCV 93.3  PLT 834*   Basic Metabolic Panel  Recent Labs  07/03/16 0312 07/04/16 0349  NA 137 134*  K 3.9 4.3  CL 101  98*  CO2 27 25  GLUCOSE 113* 109*  BUN 10 8  CREATININE 1.06 0.93  CALCIUM 8.9 9.2   Liver Function Tests No results for input(s): AST, ALT, ALKPHOS, BILITOT, PROT, ALBUMIN in the last 72 hours. No results for input(s): LIPASE, AMYLASE in the last 72 hours. Cardiac Enzymes No results for input(s): CKTOTAL, CKMB, CKMBINDEX, TROPONINI in the last 72 hours. BNP Invalid input(s): POCBNP D-Dimer No results for input(s): DDIMER in the last 72 hours. Hemoglobin A1C No results for input(s): HGBA1C in the last 72 hours. Fasting Lipid Panel No results for input(s): CHOL, HDL, LDLCALC, TRIG, CHOLHDL, LDLDIRECT in the last 72 hours. Thyroid Function Tests No results for input(s): TSH, T4TOTAL, T3FREE, THYROIDAB in the last 72 hours.  Invalid input(s): FREET3  Telemetry    No events  - Personally Reviewed  ECG    N/a - Personally Reviewed  Radiology    No results found.  Cardiac Studies   Echo 06/23/16: Study  Conclusions  - Left ventricle: The cavity size was normal. Wall thickness was   increased in a pattern of moderate LVH. Systolic function was   normal. The estimated ejection fraction was in the range of 60%   to 65%. There is akinesis of the apicalanteroseptal and   inferolateral myocardium. Features are consistent with a   pseudonormal left ventricular filling pattern, with concomitant   abnormal relaxation and increased filling pressure (grade 2   diastolic dysfunction).  Impressions:  - Since last echo, apical wall motion abnormality is new.  Patient Profile     Stephen Bowen is a 75M with PAD and CLI s/p L AKA, hypertension who had transient anterior ST elevations and an acute stroke during surgical thrombectomy of the left lower extremity.   Assessment & Plan    # Acute anterior STEMI: Stephen Bowen had an aborted STEMI with ST elevations during surgery.  He has not had any symptoms of ischemia since that time and currently denies chest pain.  Troponin was elevated to >65.  His echocardiogram shows LVEF 60-65% with akinesis of the apical anteroseptal and inferolateral myocardium.  Given his lack of symptoms and preserved LV function we will plan for medical management with outpatient stress testing vs. Cath when stable. Continue aspirin, clopidogrel, metoprolol and Imdur.  He is not on a statin due to total cholesterol of 77 and LDL 37.  # Hypertension: BP remains above goal.  We will increase amlodipine to 10 mg daily. Continue metoprolol and spironolactone.  Add lisinopril 10mg  daily.   # PAD: # L AKA:  Per vascular surgery.  Continue aspirin and clopidogrel.    Signed, Chilton Si, MD  07/05/2016, 1:27 PM

## 2016-07-05 NOTE — Progress Notes (Signed)
Pt with persistent nose bleed despite rhino rocket.  Spoke with Dr. Pollyann Kennedyosen from ENT and he will be by to see the pt.  No further recommendations at this time from Dr. Pollyann Kennedyosen until he sees the pt.  Will get CBC in the am.   BP is better controlled this afternoon with systolic in the 130's.     Doreatha MassedSamantha Chance Karam, [facility] 07/05/2016 3:30 PM

## 2016-07-06 LAB — CBC
HCT: 33.7 % — ABNORMAL LOW (ref 39.0–52.0)
HEMOGLOBIN: 10.8 g/dL — AB (ref 13.0–17.0)
MCH: 30 pg (ref 26.0–34.0)
MCHC: 32 g/dL (ref 30.0–36.0)
MCV: 93.6 fL (ref 78.0–100.0)
PLATELETS: 892 10*3/uL — AB (ref 150–400)
RBC: 3.6 MIL/uL — AB (ref 4.22–5.81)
RDW: 15.5 % (ref 11.5–15.5)
WBC: 14.5 10*3/uL — ABNORMAL HIGH (ref 4.0–10.5)

## 2016-07-06 MED ORDER — HEPARIN SODIUM (PORCINE) 5000 UNIT/ML IJ SOLN
5000.0000 [IU] | Freq: Three times a day (TID) | INTRAMUSCULAR | Status: DC
Start: 2016-07-06 — End: 2016-07-06

## 2016-07-06 MED ORDER — FOLIC ACID 1 MG PO TABS: 1.0000 mg | ORAL_TABLET | Freq: Every day | ORAL | Status: DC

## 2016-07-06 MED ORDER — LISINOPRIL 10 MG PO TABS: 10.0000 mg | ORAL_TABLET | Freq: Every day | ORAL | Status: DC

## 2016-07-06 MED ORDER — ASPIRIN 81 MG PO TBEC: 81.0000 mg | DELAYED_RELEASE_TABLET | Freq: Every day | ORAL | Status: DC

## 2016-07-06 MED ORDER — SPIRONOLACTONE 25 MG PO TABS: 25.0000 mg | ORAL_TABLET | Freq: Every day | ORAL | Status: DC

## 2016-07-06 MED ORDER — ADULT MULTIVITAMIN W/MINERALS CH: 1.0000 | ORAL_TABLET | Freq: Every day | ORAL | Status: DC

## 2016-07-06 MED ORDER — AMLODIPINE BESYLATE 10 MG PO TABS: 10.0000 mg | ORAL_TABLET | Freq: Every day | ORAL | Status: DC

## 2016-07-06 MED ORDER — METOPROLOL TARTRATE 25 MG PO TABS: 125.0000 mg | ORAL_TABLET | Freq: Two times a day (BID) | ORAL | Status: DC

## 2016-07-06 MED ORDER — THIAMINE HCL 100 MG PO TABS: 100.0000 mg | ORAL_TABLET | Freq: Every day | ORAL | Status: DC

## 2016-07-06 MED ORDER — CLOPIDOGREL BISULFATE 75 MG PO TABS: 75.0000 mg | ORAL_TABLET | Freq: Every day | ORAL | Status: DC

## 2016-07-06 NOTE — Progress Notes (Addendum)
  Progress Note    07/06/2016 7:16 AM 11 Days Post-Op  Subjective:  No complaints; sleeping and awakes easily  Afebrile HR 80's NSR 110's-130's systolic 100% RA  Vitals:   07/05/16 2021 07/06/16 0533  BP: 126/77 119/76  Pulse: 86 79  Resp: 18 18  Temp: 98.1 F (36.7 C) 97.4 F (36.3 C)    Physical Exam: Lungs:  Non labored Incisions:  Left AKA stump healing nicely; left groin incision clean and dry without hematoma; ecchymosis right groin Abdomen:  Soft, NT/ND   CBC    Component Value Date/Time   WBC 14.5 (H) 07/06/2016 0502   RBC 3.60 (L) 07/06/2016 0502   HGB 10.8 (L) 07/06/2016 0502   HCT 33.7 (L) 07/06/2016 0502   PLT 892 (H) 07/06/2016 0502   MCV 93.6 07/06/2016 0502   MCH 30.0 07/06/2016 0502   MCHC 32.0 07/06/2016 0502   RDW 15.5 07/06/2016 0502   LYMPHSABS 1.1 02/13/2016 1519   MONOABS 0.6 02/13/2016 1519   EOSABS 0.0 02/13/2016 1519   BASOSABS 0.0 02/13/2016 1519    BMET    Component Value Date/Time   NA 134 (L) 07/04/2016 0349   K 4.3 07/04/2016 0349   CL 98 (L) 07/04/2016 0349   CO2 25 07/04/2016 0349   GLUCOSE 109 (H) 07/04/2016 0349   BUN 8 07/04/2016 0349   CREATININE 0.93 07/04/2016 0349   CREATININE 0.92 11/13/2013 1031   CALCIUM 9.2 07/04/2016 0349   GFRNONAA >60 07/04/2016 0349   GFRNONAA >89 11/13/2013 1031   GFRAA >60 07/04/2016 0349   GFRAA >89 11/13/2013 1031    INR    Component Value Date/Time   INR 1.07 06/25/2016 0925     Intake/Output Summary (Last 24 hours) at 07/06/16 0716 Last data filed at 07/06/16 0500  Gross per 24 hour  Intake              620 ml  Output             1050 ml  Net             -430 ml     Assessment:  61 y.o. male is s/p:  1. Thromboembolectomy of left femoral to PT bypass graft 2. Harvest of left small saphenous vein 3. Bypass from existing graft to distal PT artery with 6mm ringed ptfe with distal vein patch 4. Left lower extremity angiogram 15 Days Post-Op And   Left AKA  11  Days Post-Op  Plan: -pt doing well this am -appreciate ENT consult-no further nose bleeds since cauterization yesterday -hgb stable from yesterday -continue Plavix/ASA -pt with leukocytosis, which has been present-decreased from yesterday.  Pt continues to be afebrile; he has non labored breathing and O2 sats are 100% on RA; urine is clear (condom cath in place) -thrombocytosis-increased from a week ago and increased again from yesterday.  Abdomen is soft and non tender to palpation. -BP much better controlled today -DVT prophylaxis:  Restart SQ heparin today? -hopefully to SNF today   Doreatha MassedSamantha Rhyne, PA-C [facility] [phone] 07/06/2016 7:16 AM  I have independently interviewed patient and agree with PA assessment and plan above. Will reorder subq heparin. SNF when bed available.   Eilyn Polack C. Randie Heinzain, MD [facility] [place] Office: [phone] Pager: [phone]

## 2016-07-06 NOTE — Progress Notes (Signed)
Speech Language Pathology Treatment: Dysphagia;Cognitive-Linquistic  Patient Details Name: Stephen Bowen MRN: [id number] DOB: 05/03/1956 Today's Date: 07/06/2016 Time: [id number] SLP Time Calculation (min) (ACUTE ONLY): 29 min  Assessment / Plan / Recommendation Clinical Impression  Pt consumed thin liquids, 4oz pureed texture, and regular solids. No overt signs or symptoms of aspiration observed. Prolonged mastication continues with regular solids. Mild lingual residue clears with liquid wash. RN entered the room and stated patient's brother requested pureed meats due to prolonged mastication of ground meats with meals. Recommend downgrade to dysphagia 1 with thin liquids to reduce work of eating and improve overall PO intake. SLP will f/u for advancement. Cognitive linguistic treatment targeted orientation, following basic commands, and automatic speech. Patient is oriented to self only. He is able to verbalize location and situation with max cues, however is unable to recall later in session. SLP gave max cues for problem solving time of year; patient is unable to choose correctly from field of 2. Follows 2/5 1 step commands independently, achieves 4/5 with max verbal and visual cues. Following moderate cues for initiation, patient counts to 10 with min cues. SLP will continue to follow.   HPI HPI: Stephen ApleyJerome D Odden is a 61 y.o. male, who presents with two week history of progressively worsening left foot pain. Per chart had left femoral to PT artery bypass with propaten on 02/16/16. PMH: tobacco abuse, HT, PAD. On 12/28 s/p thromboembolectomy patient was having ST changes and cardiology was consulted. The patient on awakening from anesthesia was aphasic and IP Code Stroke was called. Patient was not administered IV t-PA. Head CT 06/21/16 showed evidence of acute infarct involving a portion of the posterior mid to inferior right cerebellum as well as much of the left occipital lobe, particularly  medially and mid portions. 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      SLP Plan  Continue with current plan of care     Recommendations  Diet recommendations: Dysphagia 1 (puree);Thin liquid Liquids provided via: Cup;Straw Medication Administration: Whole meds with liquid Supervision: Staff to assist with self feeding Compensations: Minimize environmental distractions;Slow rate;Small sips/bites Postural Changes and/or Swallow Maneuvers: Seated upright 90 degrees                Oral Care Recommendations: Oral care BID Follow up Recommendations: Skilled Nursing facility Plan: Continue with current plan of care       GO              Rondel BatonMary Beth Ameria Sanjurjo, MS CF-SLP Speech-Language Pathologist   Stephen Bowen 07/06/2016, 4:02 PM

## 2016-07-06 NOTE — Progress Notes (Signed)
Occupational Therapy Treatment Patient Details Name: Stephen Bowen MRN: [id number] DOB: 1955/10/02 Today's Date: 07/06/2016    History of present illness 61 y.o. male who presented to [facility] for management of left ischemic foot secondary to occluded left femoral to posterior tibial bypass. He underwent thromboembolectomy of the bypass graft.  During the procedure the patient was having ST changes and cardiology was consulted. Pt with MI. The patient on awakening from anesthesia was aphasic and IP Code Stroke was called. CT 12/28 acute infarct involving a portion of the posterior mid to inferior right cerebellum as well as much of the left occipital lobe, particularly medially and mid portions. 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 PMHx: HTN, PAD   OT comments  Pt able to self feed ~6 bites and bring cup to mouth for drinking with set up, supervision, and max cues for initiation, sequencing, and termination of feeding task. Pt with limited PO intake today; reports he is full and declines any further food after ~6 bites (RN aware). D/c plan remains appropriate. Will continue to follow acutely.   Follow Up Recommendations  SNF;Supervision/Assistance - 24 hour    Equipment Recommendations  Other (comment) (TBD at next venue)    Recommendations for Other Services      Precautions / Restrictions Precautions Precautions: Fall Precaution Comments: L AKA, blind, right heel wounds Restrictions Weight Bearing Restrictions: Yes LLE Weight Bearing: Non weight bearing       Mobility Bed Mobility               General bed mobility comments: Max assist +2 for repositioning in bed.  Transfers                      Balance                                   ADL Overall ADL's : Needs assistance/impaired Eating/Feeding: Set up;Supervision/ safety;Cueing for safety;Cueing for sequencing;Bed level Eating/Feeding Details (indicate cue type and reason): Pt able to self feed with cues  for initiation, sequencing, and termination of feeding task. Pt chewing food for 1-2 mintues before swallowing despite cues. Pt only taking ~6 bites then reports "I am full" and declining any further feeding. Able to hold cup in hand with set up and bring straw to mouth.                                   General ADL Comments: Max assist +2 to scoot up in bed. RN notified of lack of PO intake and need for max cueing during meals.      Vision                     Perception     Praxis      Cognition   Behavior During Therapy: [facility] for tasks assessed/performed Overall Cognitive Status: Impaired/Different from baseline Area of Impairment: Following commands;Memory;Safety/judgement;Awareness;Problem solving     Memory: Decreased short-term memory  Following Commands: Follows one step commands consistently Safety/Judgement: Decreased awareness of safety;Decreased awareness of deficits Awareness: Intellectual Problem Solving: Difficulty sequencing;Requires verbal cues General Comments: Cues for sequencing, initiation, and termination of feeding.    Extremity/Trunk Assessment               Exercises     Shoulder Instructions  General Comments      Pertinent Vitals/ Pain       Pain Assessment: No/denies pain  Home Living                                          Prior Functioning/Environment              Frequency  Min 2X/week        Progress Toward Goals  OT Goals(current goals can now be found in the care plan section)  Progress towards OT goals: Progressing toward goals  Acute Rehab OT Goals Patient Stated Goal: none stated  Plan Discharge plan remains appropriate    Co-evaluation                 End of Session     Activity Tolerance Patient tolerated treatment well   Patient Left in bed;with call bell/phone within reach;with bed alarm set   Nurse Communication Other (comment) (limited PO  intake, cues for sequencing during self feeding)        Time: [id number] OT Time Calculation (min): 17 min  Charges: OT General Charges $OT Visit: 1 Procedure OT Treatments $Self Care/Home Management : 8-22 mins  Gaye AlkenBailey A Carlin Attridge M.S., OTR/L Pager: [phone]  07/06/2016, 4:55 PM

## 2016-07-06 NOTE — Progress Notes (Signed)
Patient Name: Stephen Bowen Date of Encounter: 07/06/2016  Primary Cardiologist: Dr. 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  Denies chest pain or shortness of breath  Inpatient Medications    Scheduled Meds: . amLODipine  10 mg Oral Daily  . aspirin EC  81 mg Oral Daily  . atorvastatin  80 mg Oral q1800  . clopidogrel  75 mg Oral Daily  . folic acid  1 mg Oral Daily  . heparin  5,000 Units Subcutaneous Q8H  . isosorbide mononitrate  60 mg Oral Daily  . lisinopril  10 mg Oral Daily  . mouth rinse  15 mL Mouth Rinse BID  . metoprolol tartrate  125 mg Oral BID  . multivitamin with minerals  1 tablet Oral Daily  . pantoprazole  40 mg Oral Daily  . polyethylene glycol  17 g Oral Daily  . sodium chloride flush  3 mL Intravenous Q12H  . spironolactone  25 mg Oral Daily  . thiamine  100 mg Oral Daily   Continuous Infusions:  PRN Meds: sodium chloride, acetaminophen **OR** acetaminophen, alum & mag hydroxide-simeth, bisacodyl, bisacodyl, guaiFENesin-dextromethorphan, hydrALAZINE, HYDROmorphone (DILAUDID) injection, LORazepam, morphine injection, ondansetron, oxyCODONE-acetaminophen, phenol, sodium chloride flush, sodium phosphate   Vital Signs    Vitals:   07/05/16 1014 07/05/16 1328 07/05/16 2021 07/06/16 0533  BP: (!) 151/103 133/80 126/77 119/76  Pulse: 94 86 86 79  Resp:  18 18 18   Temp: 98.8 F (37.1 C) 97.8 F (36.6 C) 98.1 F (36.7 C) 97.4 F (36.3 C)  TempSrc: Oral Oral Oral Oral  SpO2: 100% 100% 98% 100%  Weight:    47.4 kg (104 lb 8 oz)  Height:        Intake/Output Summary  (Last 24 hours) at 07/06/16 0936 Last data filed at 07/06/16 0500  Gross per 24 hour  Intake              620 ml  Output             1050 ml  Net             -430 ml   Filed Weights   06/20/16 1027 07/06/16 0533  Weight: 55.5 kg (122 lb 5.7 oz) 47.4 kg (104 lb 8 oz)    Physical Exam    GEN: Malnourished, well developed, in no acute distress.   HEENT: Grossly normal.  Neck: Supple, no JVD, carotid bruits, or masses. Cardiac: RRR, no murmurs, rubs, or gallops.  Extremities: L AKA.  No clubbing, cyanosis, edema.   Respiratory:  Respirations regular and unlabored, clear to auscultation bilaterally. GI: Soft, nontender, nondistended, BS + x 4. MS: no deformity or atrophy. Skin: warm and dry, no rash. Neuro:  Strength and sensation are intact. Psych: AAOx3.  Normal affect.  Labs    CBC  Recent Labs  07/05/16 0953 07/06/16 0502  WBC 16.9* 14.5*  HGB 10.9* 10.8*  HCT 33.4* 33.7*  MCV 93.3 93.6  PLT 834* 892*   Basic Metabolic Panel  Recent Labs  07/04/16  0349  NA 134*  K 4.3  CL 98*  CO2 25  GLUCOSE 109*  BUN 8  CREATININE 0.93  CALCIUM 9.2   Liver Function Tests No results for input(s): AST, ALT, ALKPHOS, BILITOT, PROT, ALBUMIN in the last 72 hours. No results for input(s): LIPASE, AMYLASE in the last 72 hours. Cardiac Enzymes No results for input(s): CKTOTAL, CKMB, CKMBINDEX, TROPONINI in the last 72 hours. BNP Invalid input(s): POCBNP D-Dimer No results for input(s): DDIMER in the last 72 hours. Hemoglobin A1C No results for input(s): HGBA1C in the last 72 hours. Fasting Lipid Panel No results for input(s): CHOL, HDL, LDLCALC, TRIG, CHOLHDL, LDLDIRECT in the last 72 hours. Thyroid Function Tests No results for input(s): TSH, T4TOTAL, T3FREE, THYROIDAB in the last 72 hours.  Invalid input(s): FREET3  Telemetry    No events  - Personally Reviewed  ECG    N/a - Personally Reviewed  Radiology    No results found.  Cardiac Studies   Echo  06/23/16: Study Conclusions  - Left ventricle: The cavity size was normal. Wall thickness was   increased in a pattern of moderate LVH. Systolic function was   normal. The estimated ejection fraction was in the range of 60%   to 65%. There is akinesis of the apicalanteroseptal and   inferolateral myocardium. Features are consistent with a   pseudonormal left ventricular filling pattern, with concomitant   abnormal relaxation and increased filling pressure (grade 2   diastolic dysfunction).  Impressions:  - Since last echo, apical wall motion abnormality is new.  Patient Profile     Stephen Bowen is a 74M with PAD and CLI s/p L AKA, hypertension who had transient anterior ST elevations and an acute stroke during surgical thrombectomy of the left lower extremity.   Assessment & Plan    # Acute anterior STEMI: Stephen Bowen had an aborted STEMI with ST elevations during surgery.  He has not had any symptoms of ischemia since that time and currently denies chest pain.  Troponin was elevated to >65.  His echocardiogram shows LVEF 60-65% with akinesis of the apical anteroseptal and inferolateral myocardium.  Given his lack of symptoms and preserved LV function we will plan for medical management with outpatient stress testing vs. Cath when stable. Continue aspirin, clopidogrel, metoprolol and Imdur.  He is not on a statin due to total cholesterol of 77 and LDL 37.  # Hypertension: BP much better controlled.  Continue amlodipine, metoprolol, lisinopril and spironolactone.  # PAD: # L AKA:  Per vascular surgery.  Continue aspirin and clopidogrel.   Stable for discharge from cardiology perspective. Will arrange follow up with Dr. Sanjuana Kava within one month.   Signed, Chilton Si, MD  07/06/2016, 9:36 AM

## 2016-07-06 NOTE — Discharge Summary (Addendum)
[facility] Discharge Summary  Stephen ApleyJerome D Bowen Jul 01, 1955 60 y.o. 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 male who presents with two week history of progressively worsening left foot pain. He stated that he suddenly had a sharp pain in his left foot two weeks ago while in the kitchen. The patient is s/p left femoral to PT artery bypass with propaten on 02/16/16 with Dr. Randie Heinzain. This was done for rest pain of his left foot. He decided to come into the ED today because he could no longer tolerate the pain. He says that his pain is not any different today than it was a few days ago.   The patient stated that he never started his coumadin because he had no ride for INR follow-up. He continues to smoke one pack a day. He denies any pain prior to two weeks ago. He did have residual numbness. He denies any pain in his right foot. Dr. Randie Heinzain planned on future right leg bypass once he recovered from his first bypass. The patient never showed up to any office appointments.   His arteriogram on 02/15/16 showed occlusion of left SFA with reconstitution of distal SFA and occlusion of popliteal artery. There was reconstitution of the PT artery after takeoff of the TP trunk. He was placed on heparin bridge to coumadin post-operatively to maintain bypass patency.   His past medical history includes hypertension that is not well managed. He is not taking any blood pressure medications. He is not taking his statin. He does take aspirin daily. He denies any chest pain, chest pressure or shortness of breath. He last ate last night at dinner and had a soda this morning around 0800. He has not had anything since.   Hospital Course:  The patient was admitted to the hospital on 06/19/2016 and taken to the PV lab on  06/20/17 and underwent:  1.  US guided cannulation of right common femoral artery 2.  Aortogram 3.  Left lower extremity angiogram 4.  Selective cannulation of left leg bypass graft 5.  Placement of 50cm treatment length lytic catheter  Intra procedural findings are as follows: Common femoral artery is patent with a diseased profunda and an SFA that occludes after a very short segment. There was no evidence of bypass graft in the original arteriogram. After cannulating the graft were able to get through a likely stenosis at the distal anastomosis in an angiogram demonstrated patent PT to the level of the foot without any antegrade flow. Lysis catheter was left within the graft 50 cm treatment length.  The next day, the lytic had been stopped due to drop in the fibrinogen and the plan changed to OR for graft thrombectomy.  Dr. Randie Heinzain discussed with the pt the gravity of situation and possibility of requiring amputation in the near future.   On 06/21/17, he was taken to the OR and underwent  1.  Thromboembolectomy of left femoral to PT bypass graft 2.  Harvest of left small saphenous vein 3.  Bypass from existing graft to distal PT artery with 6mm ringed ptfe with distal vein patch 4.  Left lower extremity angiogram  Intraoperative findings include the following: The graft actually had pulsatility in it on initial inspection. We did pull some clot as well as hyperplastic material on thromboembolectomy. Distally angiogram  demonstrated patency of the PT filling of most of the foot and following bypass we had runoff through the graft to the level of the ankle. Patient was having ST changes during the procedure.  A cardiology consult was obtained.  His EKG findings were concerning for STEMI and plans were made for emergent cardiac catheterization.  He did have some confusion and therefore a head CT was obtained, which revealed an acute embolic CVA and code STEMI was cancelled and code stroke activated.   Repeat EKG with continued ST changes but not clearly in pattern of acute MI given his baseline EKG changes.  He was then managed conservatively from cardiac standpoint.    Neurology felt he was not an IV tPA candidate as he is out of the 4.5 hour time window. Not an endovascular candidate due to visible completed strokes on CTA, relative inaccessibility of the left PCA stenoses/occlusions to catheterization and high risk of bleeding.  From a neurological standpoint permissive HTN for 24 hours with SBP treated if it goes above 180 would be optimal.   An echo and carotid duplex were ordered.  His left foot was mottled and motor was not in tact and it was determined that he would need a left AKA.  On POD 1, per neuro, The patient has agitation and altered mental status which is likely multifactorial due to combination of posterior circulation infarcts as well as possible alcohol withdrawal and is acute MI. Continue IV heparin for anticoagulation for his acute MI. Patient may not hold still to undergo an MRI and hence recommend repeating CT scan of the head and 24 hours.  His carotid duplex revealed the following: Carotid Duplex (Doppler) has been completed.  Preliminary findings:  Technically difficult study due to constant patient movement. Appears to be 40-59% right ICA stenosis and 60-79% left ICA stenosis. Calcific plaque noted at bilateral ICA bulb area.  Antegrade vertebral flow.  Per cardiology, he was started on a low dose beta blocker.  He had residual speech defect.  The echocardiogram has not yet been completed.  Dr. Randie Heinz requested feedback from our service concerning timing of amputation/general anesthesia following cardiac event.  We need to determine residual LV function from echo. I would wait as long as possible before subjecting the patient to the stress of surgery.  I also feel that he is at risk for recurrent acute ischemia because of the "rapid washout" of his cardiac markers and  the near normalization of his 12-lead EKG post infarct. My clinical suspicion is that he acutely occluded the LAD and had spontaneous recanalization (or rapid collateral recruitment).   On POD 2, neuro report on CT: 1. Evolving left greater than right bilateral PCA territory infarcts with a superimposed posterior right cerebellar (AICA) infarct, with cytotoxic edema but no associated hemorrhage. 2. Superimposed more acute appearing cytotoxic edema in the posterior left temporal lobe. This could reflect a more a acute extension of the left PCA infarct, or superimposed infarct in the posterior left MCA territory. Brain MRI and intracranial MRA without contrast would best evaluate further. 3. Mild regional mass effect in the posterior left hemisphere. Basilar cisterns remain patent and there is no midline shift or ventriculomegaly. 4. 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  Will order MRI to better define vascular burden in the posterior fossa that may not be well identified on the CT scan.  With the volume of ischemia more clearly identified  on this CT scan, patient is at risk for hemorrhagic transformation of ischemic stroke.  He did have some cortical blindness, receptive and ? Expressive aphasia.    Pt's TTE revealed normal EF function. Study Conclusions  - Left ventricle: The cavity size was normal. Wall thickness was   increased in a pattern of moderate LVH. Systolic function was   normal. The estimated ejection fraction was in the range of 60%   to 65%. There is akinesis of the apicalanteroseptal and   inferolateral myocardium. Features are consistent with a   pseudonormal left ventricular filling pattern, with concomitant   abnormal relaxation and increased filling pressure (grade 2   diastolic dysfunction).  Impressions: - Since last echo, apical wall motion abnormality is new.  On POD 3,  he became highly agitated and was given some sedation.    06/25/2016 he was taken to the OR and underwent: left AKA.  He tolerated the procedure and was taken to the pacu in stable condition.  Intraoperative findings include viable muscle at the amputation site.   Cardiology started the pt on Plavix.    On 06/26/16, he did not need anticoagulation, but was continued due to his recent MI.  On 06/27/16, his foley was discontinued and his det advanced.  He was started on alcohol withdrawal protocol.  Orders placed for transfer to stepdown.  His blood pressure medications continued to be titrated.  His TSH was normal.  He did have some hypokalemia that was resolving.  His acute blood loss anemia was also improving.    Over the next few days the pt continued to be less agitated.  His blood pressure medications continued to be titrated as well.    On 07/02/16, per cardiology, had an aborted STEMI with ST elevations during surgery.  He has not had any symptoms of ischemia since that time and currently denies chest pain.  Troponin was elevated to >65.  His echocardiogram shows LVEF 60-65% with akinesis of the apical anteroseptal and inferolateral myocardium.  He remains somewhat confused and still has a Armed forces technical officer in place.  Given his lack of symptoms and preserved LV function.  We will likely plan for medical management with outpatient stress testing vs. Cath when stable. Continue aspirin, clopidogrel, metoprolol and Imdur.  He is not on a statin due to total cholesterol of 77 and LDL 37.  On 07/04/16, he was transferred to the telemetry floor.   On 07/05/16, pt did not have complaints but did have epistaxis.  Pressure was held, a rhino rocket was placed. SQ heparin was discontinued.  Despite these efforts, he continued to have nose bleeds.  An ENT consult was obtained.  Findings were Severe nasal septal deviation, anterior septal bleeding site identified and cauterized.  His hemoglobin remained stable.   On  07/06/16, his BP was much better controlled and he is continued on amlodipine, metoprolol, lisinopril and spironolactone.   Aspirin and Plavix as well.  Aspirin has been changed from 325mg  to 81mg .    On 07/10/16, a SNF bed became available and he is transferred to the facility.  His platelet count did start to trend upward.  He also had a persistent elevated WBC.  He remained afebrile.  His abdomen was soft, non tender.  His urine remained clear and his breathing non labored and O2 saturations were 100% on room air.   Per physical therapy:  Pt pleasant, following commands and moving well today despite lack of memory, orientation and vision. Pt  educated for hospital course and provided orientation cues but unable to recall within or end of session. Pt with greatly improved balance, endurance and gait and will continue to follow to maximize function.   CBC    Component Value Date/Time   WBC 14.5 (H) 07/06/2016 0502   RBC 3.60 (L) 07/06/2016 0502   HGB 10.8 (L) 07/06/2016 0502   HCT 33.7 (L) 07/06/2016 0502   PLT 892 (H) 07/06/2016 0502   MCV 93.6 07/06/2016 0502   MCH 30.0 07/06/2016 0502   MCHC 32.0 07/06/2016 0502   RDW 15.5 07/06/2016 0502   LYMPHSABS 1.1 02/13/2016 1519   MONOABS 0.6 02/13/2016 1519   EOSABS 0.0 02/13/2016 1519   BASOSABS 0.0 02/13/2016 1519    BMET    Component Value Date/Time   NA 134 (L) 07/04/2016 0349   K 4.3 07/04/2016 0349   CL 98 (L) 07/04/2016 0349   CO2 25 07/04/2016 0349   GLUCOSE 109 (H) 07/04/2016 0349   BUN 8 07/04/2016 0349   CREATININE 0.93 07/04/2016 0349   CREATININE 0.92 11/13/2013 1031   CALCIUM 9.2 07/04/2016 0349   GFRNONAA >60 07/04/2016 0349   GFRNONAA >89 11/13/2013 1031   GFRAA >60 07/04/2016 0349   GFRAA >89 11/13/2013 1031     Discharge Instructions    Call MD for:  redness, tenderness, or signs of infection (pain, swelling, bleeding, redness, odor or green/yellow discharge around incision site)    Complete by:  As directed     Call MD for:  severe or increased pain, loss or decreased feeling  in affected limb(s)    Complete by:  As directed    Call MD for:  temperature >100.5    Complete by:  As directed    Discharge instructions    Complete by:  As directed    Discharge Diet: Diet recommendations: Dysphagia 2 (fine chop);Thin liquid Liquids provided via: Cup;Straw Medication Administration: Whole meds with liquid Supervision: Staff to assist with self feeding Compensations: Minimize environmental distractions;Slow rate;Small sips/bites Postural Changes and/or Swallow Maneuvers: Seated upright 90 degrees   Other Restrictions    Complete by:  As directed    Precautions Precautions: Fall Precaution Comments: L AKA, blind, right heel wounds Restrictions Weight Bearing Restrictions: Yes LLE Weight Bearing: Non weight bearing      Discharge Diagnosis:  foot pain cold leg pvd thrombus stemi ISCHEMIC LEFT LEG  Secondary Diagnosis: Patient Active Problem List   Diagnosis Date Noted  . Pressure injury of skin 06/28/2016  . 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 Aphasia   . Hypotension 06/21/2016  . Stroke (HCC) 06/21/2016  . Acute MI, anterolateral wall, initial episode of care (HCC)   . 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 Preoperative cardiovascular examination   . PAD (peripheral artery disease) (HCC) 02/13/2016  . Leg pain 02/13/2016  . Hyperkalemia 02/13/2016  . Lactic acid increased 02/13/2016  . Essential hypertension 02/13/2016  . Seborrheic dermatitis 08/31/2015  . Actinic keratosis of multiple sites of head and neck 08/31/2015  . Tobacco use disorder 08/31/2015  . Skin ulcer of scalp (HCC) 08/31/2015  . Atherosclerosis of native arteries of extremity with intermittent claudication (HCC) 07/07/2015  . Abnormal EKG 11/13/2013  . Hypertensive urgency 11/13/2013   Past Medical History:  Diagnosis Date    . HTN (hypertension)   . Noncompliance   . PAD (peripheral artery disease) (HCC)  a.  s/p left femoral to PT artery bypass with propaten on 02/16/16.     Allergies as of 07/10/2016   No Known Allergies     Medication List    STOP taking these medications   aspirin 325 MG tablet Replaced by:  aspirin 81 MG EC tablet   enoxaparin 100 MG/ML injection Commonly known as:  LOVENOX   HYDROcodone-acetaminophen 5-325 MG tablet Commonly known as:  NORCO/VICODIN   lisinopril-hydrochlorothiazide 10-12.5 MG tablet Commonly known as:  ZESTORETIC   nicotine 21 mg/24hr patch Commonly known as:  NICODERM CQ - dosed in mg/24 hours   nicotine polacrilex 4 MG gum Commonly known as:  NICORETTE   verapamil 120 MG CR tablet Commonly known as:  CALAN-SR   warfarin 5 MG tablet Commonly known as:  COUMADIN     TAKE these medications   amLODipine 10 MG tablet Commonly known as:  NORVASC Take 1 tablet (10 mg total) by mouth daily.   aspirin 81 MG EC tablet Take 1 tablet (81 mg total) by mouth daily. Replaces:  aspirin 325 MG tablet   atorvastatin 40 MG tablet Commonly known as:  LIPITOR Take 1 tablet (40 mg total) by mouth daily at 6 PM.   bisacodyl 5 MG EC tablet Commonly known as:  DULCOLAX Take 1 tablet (5 mg total) by mouth daily as needed for moderate constipation.   clopidogrel 75 MG tablet Commonly known as:  PLAVIX Take 1 tablet (75 mg total) by mouth daily.   docusate sodium 100 MG capsule Commonly known as:  COLACE Take 1 capsule (100 mg total) by mouth daily.   folic acid 1 MG tablet Commonly known as:  FOLVITE Take 1 tablet (1 mg total) by mouth daily.   lisinopril 20 MG tablet Commonly known as:  PRINIVIL,ZESTRIL Take 1 tablet (20 mg total) by mouth daily.   metoprolol tartrate 25 MG tablet Commonly known as:  LOPRESSOR Take 5 tablets (125 mg total) by mouth 2 (two) times daily.   multivitamin with minerals Tabs tablet Take 1 tablet by mouth daily.    oxyCODONE-acetaminophen 5-325 MG tablet Commonly known as:  ROXICET Take 1 tablet by mouth every 6 (six) hours as needed for moderate pain or severe pain.   pantoprazole 40 MG tablet Commonly known as:  PROTONIX Take 1 tablet (40 mg total) by mouth daily.   selenium sulfide 1 % Lotn Commonly known as:  SELSUN Put on your face in the shower and wash off daily   spironolactone 25 MG tablet Commonly known as:  ALDACTONE Take 1 tablet (25 mg total) by mouth daily.   thiamine 100 MG tablet Take 1 tablet (100 mg total) by mouth daily.      Rx given: Percocet #30 No Refill  Disposition: SNF  Discharge Instructions: 1.  Diet as follows: Diet recommendations: Dysphagia 1 (puree) Liquids provided via: Cup;Straw Medication Administration: Whole meds with liquid Supervision: Staff to assist with self feeding Compensations: Minimize environmental distractions;Slow rate;Small sips/bites Postural Changes and/or Swallow Maneuvers: Seated upright 90 degrees 2.  Oral care BID   Physical therapy: General bed mobility comments: multimodal cues for sequence with rolling and pt continuing to attempt to elevate trunk, guarding for safety with rise to sitting, cues for scooting to EOB  Transfers Overall transfer level: Needs assistance   Transfers: Sit to/from Stand Sit to Stand: Min assist   General transfer comment: assist for hand placement on RW and onto chair with sitting x 2 trials, no physical assist  to rise  Ambulation/Gait Ambulation/Gait assistance: Min assist;+2 safety/equipment Ambulation Distance (Feet): 50 Feet Assistive device: Rolling walker (2 wheeled) Gait Pattern/deviations: Step-to pattern Gait velocity: decreased   General Gait Details: pt walked 6' then 3' after seated rest, min assist to direct RW, cues for decreased step length to stay in RW, chair to follow for safety, verbal cues for environmental setup   PT precautions as  follows: Precautions Precautions: Fall Precaution Comments: L AKA, blind, right heel wounds Restrictions Weight Bearing Restrictions: No LLE Weight Bearing: Non weight bearing   Patient's condition: is Fair  Follow up: 1. Dr. Randie Heinz in 2 weeks   Doreatha Massed, [facility] [phone] 07/10/2016 9:14 AM   - For VQI Registry use ---  Post-op:  Wound infection: No  Graft infection: No  Transfusion: Yes  If yes, 7 units given New Arrhythmia: No Ipsilateral amputation: Yes, [ ]  Minor, [ ]  BKA, [ x] AKA Discharge patency: [ ]  Primary, [ ]  Primary assisted, [ ]  Secondary, [ ]  Occluded Patency judged by: [ ]  Dopper only, [ ]  Palpable graft pulse, [ ]  Palpable distal pulse, [ ]  ABI inc. > 0.15, [ ]  Duplex Discharge ABI: R n/a, L n/a Discharge TBI: R , L  D/C Ambulatory Status: Ambulatory with Assistance  Complications: MI: Yes, [x ] Troponin only, [x ] EKG or Clinical CHF: No Resp failure:No, [ ]  Pneumonia, [ ]  Ventilator Chg in renal function: No, [ ]  Inc. Cr > 0.5, [ ]  Temp. Dialysis, [ ]  Permanent dialysis Stroke: Yes, [ ]  Minor, [ ]  Major Return to OR: Yes  Reason for return to OR: [ ]  Bleeding, [ ]  Infection, [x ] Thrombosis-amputation, [ ]  Revision  Discharge medications: Statin use:  yes ASA use:  yes Plavix use:  yes Beta blocker use: yes CCB:  yes Coumadin use: no   Maris Berger, PA-C

## 2016-07-06 NOTE — Progress Notes (Addendum)
Physical Therapy Treatment Patient Details Name: Stephen Bowen MRN: [id number] DOB: April 27, 1956 Today's Date: 07/06/2016    History of Present Illness 61 y.o. male who presented to [facility] for management of left ischemic foot secondary to occluded left femoral to posterior tibial bypass. He underwent thromboembolectomy of the bypass graft.  During the procedure the patient was having ST changes and cardiology was consulted. Pt with MI. The patient on awakening from anesthesia was aphasic and IP Code Stroke was called. CT 12/28 acute infarct involving a portion of the posterior mid to inferior right cerebellum as well as much of the left occipital lobe, particularly medially and mid portions. 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 PMHx: HTN, PAD    PT Comments    Pt pleasant, following commands and moving well today despite lack of memory, orientation and vision. Pt educated for hospital course and provided orientation cues but unable to recall within or end of session. Pt with greatly improved balance, endurance and gait and will continue to follow to maximize function.   HR 76-85   Follow Up Recommendations  SNF     Equipment Recommendations       Recommendations for Other Services       Precautions / Restrictions Precautions Precautions: Fall Precaution Comments: L AKA, blind, right heel wounds Restrictions Weight Bearing Restrictions: No LLE Weight Bearing: Non weight bearing    Mobility  Bed Mobility Overal bed mobility: Needs Assistance Bed Mobility: Supine to Sit;Rolling Rolling: Supervision   Supine to sit: Min guard     General bed mobility comments: multimodal cues for sequence with rolling and pt continuing to attempt to elevate trunk, guarding for safety with rise to sitting, cues for scooting to EOB  Transfers Overall transfer level: Needs assistance   Transfers: Sit to/from Stand Sit to Stand: Min assist         General transfer comment: assist for hand placement on RW  and onto chair with sitting x 2 trials, no physical assist to rise  Ambulation/Gait Ambulation/Gait assistance: Min assist;+2 safety/equipment Ambulation Distance (Feet): 50 Feet Assistive device: Rolling walker (2 wheeled) Gait Pattern/deviations: Step-to pattern Gait velocity: decreased   General Gait Details: pt walked 6' then 71' after seated rest, min assist to direct RW, cues for decreased step length to stay in RW, chair to follow for safety, verbal cues for environmental setup   Stairs            Wheelchair Mobility    Modified Rankin (Stroke Patients Only) Modified Rankin (Stroke Patients Only) Pre-Morbid Rankin Score: No symptoms Modified Rankin: Moderately severe disability     Balance Overall balance assessment: Needs assistance   Sitting balance-Leahy Scale: Fair       Standing balance-Leahy Scale: Poor                      Cognition Arousal/Alertness: Awake/alert Behavior During Therapy: WFL for tasks assessed/performed Overall Cognitive Status: Impaired/Different from baseline Area of Impairment: Memory;Following commands;Safety/judgement;Orientation Orientation Level: Place;Time;Situation Current Attention Level: Sustained Memory: Decreased short-term memory;Decreased recall of precautions Following Commands: Follows one step commands consistently Safety/Judgement: Decreased awareness of safety;Decreased awareness of deficits     General Comments: verbal cues for sequencing    Exercises General Exercises - Lower Extremity Long Arc Quad: AROM;Right;15 reps;Seated Hip ABduction/ADduction: AROM;Left;Seated;15 reps Hip Flexion/Marching: AROM;Right;Seated;15 reps    General Comments        Pertinent Vitals/Pain Pain Assessment: No/denies pain    Home Living  Prior Function            PT Goals (current goals can now be found in the care plan section) Progress towards PT goals: Goals met and updated  - see care plan    Frequency           PT Plan Current plan remains appropriate    Co-evaluation             End of Session Equipment Utilized During Treatment: Gait belt Activity Tolerance: Patient tolerated treatment well Patient left: in chair;with call bell/phone within reach;with chair alarm set;with family/visitor present     Time: 3584-4652 PT Time Calculation (min) (ACUTE ONLY): 26 min  Charges:  $Gait Training: 8-22 mins $Therapeutic Activity: 8-22 mins                    G Codes:      Teigan Sahli B Arnett Duddy 07-10-2016, 11:25 AM Elwyn Reach, [place]

## 2016-07-07 NOTE — Progress Notes (Addendum)
  [facility] Progress Note  Subjective  No complaints.   Objective Vitals:   07/06/16 2015 07/07/16 0502  BP: (!) 145/81 129/67  Pulse: 94 98  Resp: 20 20  Temp: 98 F (36.7 C) 97.8 F (36.6 C)   No intake or output data in the 24 hours ending 07/07/16 0841  Oriented to self. Not oriented to place. Blind. Unable to count my fingers.  Left AKA incision healing well.  Assessment/Planning: 61 y.o. male is s/p:  1. Thromboembolectomy of left femoral to PT bypass graft 2. Harvest of left small saphenous vein 3. Bypass from existing graft to distal PT artery with 6mm ringed ptfe with distal vein patch 4. Left lower extremity angiogram 12 Days Post-Op   Blood pressure well controlled this am. Appreciate cardiology following. Left AKA healing well.  DVT prophylaxis:  SQ heparin D/c to SNF once bed available.  Raymond GurneyKimberly A Trinh 07/07/2016 8:41 AM --  Laboratory CBC    Component Value Date/Time   WBC 14.5 (H) 07/06/2016 0502   HGB 10.8 (L) 07/06/2016 0502   HCT 33.7 (L) 07/06/2016 0502   PLT 892 (H) 07/06/2016 0502    BMET    Component Value Date/Time   NA 134 (L) 07/04/2016 0349   K 4.3 07/04/2016 0349   CL 98 (L) 07/04/2016 0349   CO2 25 07/04/2016 0349   GLUCOSE 109 (H) 07/04/2016 0349   BUN 8 07/04/2016 0349   CREATININE 0.93 07/04/2016 0349   CREATININE 0.92 11/13/2013 1031   CALCIUM 9.2 07/04/2016 0349   GFRNONAA >60 07/04/2016 0349   GFRNONAA >89 11/13/2013 1031   GFRAA >60 07/04/2016 0349   GFRAA >89 11/13/2013 1031    COAG Lab Results  Component Value Date   INR 1.07 06/25/2016   INR 0.97 06/19/2016   INR 0.92 06/19/2016   No results found for: PTT  Antibiotics Anti-infectives    Start     Dose/Rate Route Frequency Ordered Stop   06/25/16 0730  ceFAZolin (ANCEF) IVPB 2g/100 mL premix  Status:  Discontinued    Comments:  Send with pt to OR   2 g 200 mL/hr over 30 Minutes Intravenous To ShortStay Surgical 06/25/16  0420 06/25/16 1005   06/20/16 1730  ceFAZolin (ANCEF) IVPB 2g/100 mL premix     2 g 200 mL/hr over 30 Minutes Intravenous Every 8 hours 06/20/16 1621 06/20/16 1814       Maris BergerKimberly Trinh, PA-C [facility] Office: [phone] Pager: [phone] 07/07/2016 8:41 AM    I have independently interviewed patient and agree with PA assessment and plan above. Pending snf placement.   Kina Shiffman C. Randie Heinzain, MD [facility] [place] Office: [phone] Pager: [phone]

## 2016-07-08 NOTE — Progress Notes (Addendum)
  [facility] Progress Note  Subjective    Comfortable this am.  Objective Vitals:   07/07/16 2017 07/08/16 0501  BP: 124/81 124/62  Pulse: 90 66  Resp: 20 20  Temp: 97.5 F (36.4 C) 98.2 F (36.8 C)    Intake/Output Summary (Last 24 hours) at 07/08/16 0823 Last data filed at 07/07/16 1800  Gross per 24 hour  Intake              480 ml  Output                0 ml  Net              480 ml   Oriented to person RRR Lungs clear Abdomen soft and nontender. Left AKA healing well  Assessment/Planning: 61 y.o. male is s/p: 1. Thromboembolectomy of left femoral to PT bypass graft 2. Harvest of left small saphenous vein 3. Bypass from existing graft to distal PT artery with 6mm ringed ptfe with distal vein patch 4. Left lower extremity angiogram 13 Days Post-Op   BP well controlled. L AKA viable. To SNF when available.   Raymond GurneyKimberly A Trinh 07/08/2016 8:23 AM --  Laboratory CBC    Component Value Date/Time   WBC 14.5 (H) 07/06/2016 0502   HGB 10.8 (L) 07/06/2016 0502   HCT 33.7 (L) 07/06/2016 0502   PLT 892 (H) 07/06/2016 0502    BMET    Component Value Date/Time   NA 134 (L) 07/04/2016 0349   K 4.3 07/04/2016 0349   CL 98 (L) 07/04/2016 0349   CO2 25 07/04/2016 0349   GLUCOSE 109 (H) 07/04/2016 0349   BUN 8 07/04/2016 0349   CREATININE 0.93 07/04/2016 0349   CREATININE 0.92 11/13/2013 1031   CALCIUM 9.2 07/04/2016 0349   GFRNONAA >60 07/04/2016 0349   GFRNONAA >89 11/13/2013 1031   GFRAA >60 07/04/2016 0349   GFRAA >89 11/13/2013 1031    COAG Lab Results  Component Value Date   INR 1.07 06/25/2016   INR 0.97 06/19/2016   INR 0.92 06/19/2016   No results found for: PTT  Antibiotics Anti-infectives    Start     Dose/Rate Route Frequency Ordered Stop   06/25/16 0730  ceFAZolin (ANCEF) IVPB 2g/100 mL premix  Status:  Discontinued    Comments:  Send with pt to OR   2 g 200 mL/hr over 30 Minutes Intravenous To ShortStay  Surgical 06/25/16 0420 06/25/16 1005   06/20/16 1730  ceFAZolin (ANCEF) IVPB 2g/100 mL premix     2 g 200 mL/hr over 30 Minutes Intravenous Every 8 hours 06/20/16 1621 06/20/16 1814       Maris BergerKimberly Trinh, PA-C [facility] Office: [phone] Pager: [phone] 07/08/2016 8:23 AM   I have independently interviewed patient and agree with PA assessment and plan above.   Usha Slager C. Randie Heinzain, MD [facility] [place] Office: [phone] Pager: [phone]

## 2016-07-09 DIAGNOSIS — I63433 Cerebral infarction due to embolism of bilateral posterior cerebral arteries: Secondary | ICD-10-CM

## 2016-07-09 MED ORDER — LISINOPRIL 10 MG PO TABS
20.0000 mg | ORAL_TABLET | Freq: Every day | ORAL | Status: DC
  Filled 2016-07-09: qty 2

## 2016-07-09 NOTE — Progress Notes (Signed)
Speech Language Pathology Treatment: Dysphagia;Cognitive-Linquistic  Patient Details Name: Stephen Bowen Aslin MRN: [id number] DOB: 1956-02-11 Today's Date: 07/09/2016 Time: [id number] SLP Time Calculation (min) (ACUTE ONLY): 27 min  Assessment / Plan / Recommendation Clinical Impression  RN reports poor PO intake. Suspect visual deficits are impacting self-feeding. Provided patient with tactile cues; he is able to hold cups/bowls and use utensils simultaneously if assisted with set up. He independently consumed ~12 oz pureed texture with modified independence upon set-up, with intermittent wash of thin liquids via straw. No overt signs of aspiration noted. Upgraded trials of mechanical soft solid. Patient with prolonged mastication, stated, "I can't chew this up," and expectorated. Recommend continuing diet of dysphagia 1/thin liquids, with assistance for set-up. Cognitive linguistic treatment targeted automatic speech and simple reasoning tasks. With moderate verbal cues for initiation, patient counted independently from 3 to 21. During subsequent automatic speech tasks, patient occasionally perseverates numbers but is able to repeat days of the week, months with moderate cues. Speech is generally more appropriate; continue to note neologisms, paraphasias and word-finding deficits. SLP will continue to follow.   HPI HPI: Stephen Bowen Heuring is a 61 y.o. male, who presents with two week history of progressively worsening left foot pain. Per chart had left femoral to PT artery bypass with propaten on 02/16/16. PMH: tobacco abuse, HT, PAD. On 12/28 s/p thromboembolectomy patient was having ST changes and cardiology was consulted. The patient on awakening from anesthesia was aphasic and IP Code Stroke was called. Patient was not administered IV t-PA. Head CT 06/21/16 showed evidence of acute infarct involving a portion of the posterior mid to inferior right cerebellum as well as much of the left occipital lobe,  particularly medially and mid portions. 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      SLP Plan  Continue with current plan of care     Recommendations  Diet recommendations: Dysphagia 1 (puree) Liquids provided via: Cup;Straw Medication Administration: Whole meds with liquid Supervision: Staff to assist with self feeding Compensations: Minimize environmental distractions;Slow rate;Small sips/bites Postural Changes and/or Swallow Maneuvers: Seated upright 90 degrees                Oral Care Recommendations: Oral care BID Follow up Recommendations: Skilled Nursing facility Plan: Continue with current plan of care       GO              Rondel BatonMary Beth Shahd Occhipinti, MS CF-SLP Speech-Language Pathologist [phone]  Arlana LindauMary E Chuong Casebeer 07/09/2016, 5:12 PM

## 2016-07-09 NOTE — Progress Notes (Signed)
Pt removed 2nd IV placed.  Per verbal from day nurse, on call stated if Pt removed 2nd IV to leave Pt without IV access.  Pt oriented to self only and has been restless this shift.  Will cont to monitor Pt closely and will alert attending in AM of loss of IV site.  Pt awaiting SNF bed placement to discharge.

## 2016-07-09 NOTE — Progress Notes (Signed)
CSW reached out to admissions coordinator Shontel from [facility]. Shontel stated she can offer a bed to the patients. Shontel requested that the LOG be faxed over on 01/16 with discharge summary. CSW spoke to patients brother and he stated he is agreeable to this arrangement. CSW contacted patients PA (kim) to make her aware of bed offer. Selena BattenKim will work on patiens summery for tomorrow 01/16. CSW remains available for support and discharge needs.   Marrianne MoodAshley Ndeye Tenorio, MSW,  [employer] [phone]

## 2016-07-09 NOTE — Progress Notes (Addendum)
Patient Name: Stephen Bowen Date of Encounter: 07/09/2016  Primary Cardiologist: Dr. 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   Inpatient Medications    Scheduled Meds: . amLODipine  10 mg Oral Daily  . aspirin EC  81 mg Oral Daily  . atorvastatin  80 mg Oral q1800  . clopidogrel  75 mg Oral Daily  . folic acid  1 mg Oral Daily  . heparin  5,000 Units Subcutaneous Q8H  . isosorbide mononitrate  60 mg Oral Daily  . lisinopril  10 mg Oral Daily  . mouth rinse  15 mL Mouth Rinse BID  . metoprolol tartrate  125 mg Oral BID  . multivitamin with minerals  1 tablet Oral Daily  . pantoprazole  40 mg Oral Daily  . polyethylene glycol  17 g Oral Daily  . sodium chloride flush  3 mL Intravenous Q12H  . spironolactone  25 mg Oral Daily  . thiamine  100 mg Oral Daily   Continuous Infusions:  PRN Meds: sodium chloride, acetaminophen **OR** acetaminophen, alum & mag hydroxide-simeth, bisacodyl, bisacodyl, guaiFENesin-dextromethorphan, hydrALAZINE, HYDROmorphone (DILAUDID) injection, LORazepam, morphine injection, ondansetron, oxyCODONE-acetaminophen, phenol, sodium chloride flush, sodium phosphate   Vital Signs    Vitals:   07/08/16 1400 07/08/16 2036 07/09/16 0523 07/09/16 1057  BP: 106/69 (!) 142/72 (!) 149/92 (!) 168/92  Pulse: 78 91 81 92  Resp: 20 20 20    Temp: 98 F (36.7 C) 97.3 F (36.3 C) 97.6 F (36.4 C)   TempSrc: Oral Oral Oral   SpO2: 99% 100% 100%   Weight:      Height:        Intake/Output Summary (Last 24 hours) at  07/09/16 1205 Last data filed at 07/09/16 0900  Gross per 24 hour  Intake              180 ml  Output              500 ml  Net             -320 ml   Filed Weights   06/20/16 1027 07/06/16 0533  Weight: 122 lb 5.7 oz (55.5 kg) 104 lb 8 oz (47.4 kg)    Physical Exam    GEN: Malnourished, well developed, in no acute distress.   HEENT: Grossly normal.  Neck: Supple, no JVD, carotid bruits, or masses. Cardiac: RRR, no murmurs, rubs, or gallops.  Extremities: L AKA.  No clubbing, cyanosis, edema.   Respiratory:  Respirations regular and unlabored, clear to auscultation bilaterally. GI: Soft, nontender, nondistended, BS + x 4. MS: no deformity or atrophy. Skin: warm and dry, no rash. Neuro:  Strength and sensation are intact. Psych: AAOx3.  Normal affect.  Labs     Telemetry    No events  - Personally Reviewed  ECG    N/a - Personally Reviewed  Radiology    No results found.  Cardiac Studies   Echo 06/23/16: Study Conclusions  -  Left ventricle: The cavity size was normal. Wall thickness was   increased in a pattern of moderate LVH. Systolic function was   normal. The estimated ejection fraction was in the range of 60%   to 65%. There is akinesis of the apicalanteroseptal and   inferolateral myocardium. Features are consistent with a   pseudonormal left ventricular filling pattern, with concomitant   abnormal relaxation and increased filling pressure (grade 2   diastolic dysfunction).  Impressions:  - Since last echo, apical wall motion abnormality is new.  Patient Profile     Mr. Stephen Bowen is a 74M with PAD and CLI s/p L AKA, hypertension who had transient anterior ST elevations and an acute stroke during surgical thrombectomy of the left lower extremity.   Assessment & Plan    # Acute anterior STEMI: Mr. Stephen Bowen had an aborted STEMI with ST elevations during surgery.  He has not had any symptoms of ischemia since that time and currently denies chest pain.   Troponin was elevated to >65.  His echocardiogram shows LVEF 60-65% with akinesis of the apical anteroseptal and inferolateral myocardium.  Given his lack of symptoms and preserved LV function we will plan for medical management with outpatient stress testing vs. Cath when stable. Continue aspirin, clopidogrel, metoprolol and Imdur.  He is not on a statin due to total cholesterol of 77 and LDL 37. However I would start low dose rosuvastatin 5 mg po daily for plaque stabilization in the settings of STEMI and severe PAD.  We will follow up in the clinic and decide about stress test vs cath vs medical therapy given lack of symptoms, preserved LVEF and previous stroke at that point. He is being discharged to SNF today.   # Hypertension: uncontrolled.  Continue amlodipine, metoprolol, spironolactone, increase lisinopril to 20 mg po daily.  # PAD: # L AKA:  Per vascular surgery.  Continue aspirin and clopidogrel.   Stable for discharge from cardiology perspective. Will arrange follow up with Dr. Sanjuana Kava within one month.   Signed, Tobias Alexander, MD  07/09/2016, 12:05 PM

## 2016-07-09 NOTE — Progress Notes (Signed)
Clinical Social Worker spoke with patients brother via phone in regards to patients pending discharge. Patients brother Alfrod stated that he has applied for both Medicaid and disability on patients behalf. Oneta Racklford stated they are both pending. CSW confirmed with Quita Skyeoni Harris ([facility]acial Coordinator for [facility]) that patients medicaid is pending. CSW spoke with Admission Coordinator [facility] (tammy). Tammy stated that she wants to know if the LOG will cover PT, OT, ST and if family wants to do long term care for patients. CSW relayed the question to patients brother. Brother stated he would want to place patient in long term care because he would be unable to take care of him. CSW explained to patients brother that if he placed patient in long term facility the family would need to assist patient with income until patients disability income kicks in. Alfrod voiced he is very overwhelmed with trying to find long term care for patient. CSW will remain available for support and discharge needs for patient.  Marrianne MoodAshley Mailyn Steichen, MSW,  [employer] [phone]

## 2016-07-09 NOTE — Progress Notes (Addendum)
  Progress Note    07/09/2016 7:19 AM 14 Days Post-Op  Subjective:  No complaints; pulled IV out last night.  Afebrile HR 70's-90's NSR 140's systolic 100% RA  Vitals:   07/08/16 2036 07/09/16 0523  BP: (!) 142/72 (!) 149/92  Pulse: 91 81  Resp: 20 20  Temp: 97.3 F (36.3 C) 97.6 F (36.4 C)    Physical Exam: Lungs:  Non labored Incisions:  Left groin is clean and dry; left AKA stump is clean with staples in tact.   CBC    Component Value Date/Time   WBC 14.5 (H) 07/06/2016 0502   RBC 3.60 (L) 07/06/2016 0502   HGB 10.8 (L) 07/06/2016 0502   HCT 33.7 (L) 07/06/2016 0502   PLT 892 (H) 07/06/2016 0502   MCV 93.6 07/06/2016 0502   MCH 30.0 07/06/2016 0502   MCHC 32.0 07/06/2016 0502   RDW 15.5 07/06/2016 0502   LYMPHSABS 1.1 02/13/2016 1519   MONOABS 0.6 02/13/2016 1519   EOSABS 0.0 02/13/2016 1519   BASOSABS 0.0 02/13/2016 1519    BMET    Component Value Date/Time   NA 134 (L) 07/04/2016 0349   K 4.3 07/04/2016 0349   CL 98 (L) 07/04/2016 0349   CO2 25 07/04/2016 0349   GLUCOSE 109 (H) 07/04/2016 0349   BUN 8 07/04/2016 0349   CREATININE 0.93 07/04/2016 0349   CREATININE 0.92 11/13/2013 1031   CALCIUM 9.2 07/04/2016 0349   GFRNONAA >60 07/04/2016 0349   GFRNONAA >89 11/13/2013 1031   GFRAA >60 07/04/2016 0349   GFRAA >89 11/13/2013 1031    INR    Component Value Date/Time   INR 1.07 06/25/2016 0925     Intake/Output Summary (Last 24 hours) at 07/09/16 0719 Last data filed at 07/08/16 1700  Gross per 24 hour  Intake              120 ml  Output              500 ml  Net             -380 ml     Assessment:  61 y.o. male is s/p:  1. Thromboembolectomy of left femoral to PT bypass graft 2. Harvest of left small saphenous vein 3. Bypass from existing graft to distal PT artery with 6mm ringed ptfe with distal vein patch 4. Left lower extremity angiogram 18 Days Post-Op And  Left AKA  14 Days Post-Op  Plan: -pt doing well this  morning-calm, but not alert to place or president -hopefully to SNF today -pt remains afebrile -pt had BM last pm -DVT prophylaxis:  SQ heparin -pt has f/u appt with Dr. Randie Heinzain on 07/27/16 for check and staple removal   Doreatha MassedSamantha Rhyne, PA-C [facility] [phone] 07/09/2016 7:19 AM  I have independently interviewed patient and agree with PA assessment and plan above. Unfortunately continues to pull iv's out but is lucid. Possible snf today.  Vannesa Abair C. Randie Heinzain, MD [facility] [place] Office: [phone] Pager: [phone]

## 2016-07-10 MED ORDER — OXYCODONE-ACETAMINOPHEN 5-325 MG PO TABS: 1.0000 | ORAL_TABLET | Freq: Four times a day (QID) | ORAL | 0 refills | Status: DC | PRN

## 2016-07-10 MED ORDER — LISINOPRIL 20 MG PO TABS: 20.0000 mg | ORAL_TABLET | Freq: Every day | ORAL | 11 refills | Status: DC

## 2016-07-10 NOTE — Progress Notes (Signed)
Clinical Social Worker facilitated patient discharge including contacting patient family and facility to confirm patient discharge plans.  Clinical information faxed to facility and family agreeable with plan.  CSW [facility] .  RN Lauren to call [phone] and ask for the nurse that has section 3 for report prior to discharge. [facility] will be [facility]  Clinical Social Worker will sign off for now as social work intervention is no longer needed. Please consult us again if new need arises.  Marrianne MoodAshley Landers Prajapati, MSW, [employer] [phone]

## 2016-07-10 NOTE — Progress Notes (Signed)
Physical Therapy Treatment Patient Details Name: Stephen Bowen MRN: [id number] DOB: 1955-11-14 Today's Date: 07/10/2016    History of Present Illness 61 y.o. male who presented to [facility] for management of left ischemic foot secondary to occluded left femoral to posterior tibial bypass. He underwent thromboembolectomy of the bypass graft.  During the procedure the patient was having ST changes and cardiology was consulted. Pt with MI. The patient on awakening from anesthesia was aphasic and IP Code Stroke was called. CT 12/28 acute infarct involving a portion of the posterior mid to inferior right cerebellum as well as much of the left occipital lobe, particularly medially and mid portions. 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 PMHx: HTN, PAD    PT Comments    Pt remains pleasant, confused and unaware of medical circumstances or situation. Pt continues to progress with gait, transfers and balance. Pt in chair with nursing aware of need for assist for feeding. Will continue to follow.   HR 94  Follow Up Recommendations  SNF     Equipment Recommendations       Recommendations for Other Services       Precautions / Restrictions Precautions Precautions: Fall Precaution Comments: L AKA, blind, right heel wounds Restrictions Weight Bearing Restrictions: No LLE Weight Bearing: Non weight bearing    Mobility  Bed Mobility Overal bed mobility: Needs Assistance Bed Mobility: Supine to Sit     Supine to sit: Min assist     General bed mobility comments: cues for sequence and positioning, min HHA to elevate trunk and scoot fully to EOB  Transfers Overall transfer level: Needs assistance   Transfers: Sit to/from Stand Sit to Stand: Min assist Stand pivot transfers: Min assist       General transfer comment: assist for hand placement on RW and onto chair with sitting x 3 trials, guarding and assist for balance and safety, no physical assist to rise  Ambulation/Gait Ambulation/Gait  assistance: Min assist;+2 safety/equipment Ambulation Distance (Feet): 65 Feet Assistive device: Rolling walker (2 wheeled) Gait Pattern/deviations: Step-to pattern   Gait velocity interpretation: Below normal speed for age/gender General Gait Details: pt walked 45' then 87' after seated rest, min assist to direct and control RW with cues for position in RW and safety, chair to follow, verbal cues for environment   Stairs            Wheelchair Mobility    Modified Rankin (Stroke Patients Only) Modified Rankin (Stroke Patients Only) Pre-Morbid Rankin Score: No symptoms Modified Rankin: Moderately severe disability     Balance Overall balance assessment: Needs assistance   Sitting balance-Leahy Scale: Fair       Standing balance-Leahy Scale: Poor                      Cognition Arousal/Alertness: Awake/alert Behavior During Therapy: WFL for tasks assessed/performed Overall Cognitive Status: Impaired/Different from baseline Area of Impairment: Following commands;Memory;Safety/judgement;Awareness;Problem solving;Orientation Orientation Level: Place;Time;Situation;Disoriented to Current Attention Level: Sustained Memory: Decreased short-term memory Following Commands: Follows one step commands consistently Safety/Judgement: Decreased awareness of safety;Decreased awareness of deficits   Problem Solving: Difficulty sequencing;Requires verbal cues      Exercises General Exercises - Lower Extremity Hip ABduction/ADduction: Left;Supine;15 reps;AROM Amputee Exercises Hip Extension: AROM;Left;Sidelying;15 reps    General Comments        Pertinent Vitals/Pain Pain Score: 6  Pain Location: left leg Pain Descriptors / Indicators: Grimacing Pain Intervention(s): Limited activity within patient's tolerance;Repositioned    Home Living  Prior Function            PT Goals (current goals can now be found in the care plan section)  Progress towards PT goals: Progressing toward goals    Frequency           PT Plan Current plan remains appropriate    Co-evaluation             End of Session Equipment Utilized During Treatment: Gait belt Activity Tolerance: Patient tolerated treatment well Patient left: in chair;with call bell/phone within reach;with chair alarm set     Time: [id number] PT Time Calculation (min) (ACUTE ONLY): 30 min  Charges:  $Gait Training: 8-22 mins                    G Codes:      Montgomery Rothlisberger B Kai Railsback 07/10/2016, 10:08 AM Stephen Bowen, PT [phone]

## 2016-07-10 NOTE — Care Management Note (Signed)
Case Management Note Previous CM note initiated by Elliot CousinShavis, Alesia Ellen, RN 06/28/2016, 12:11 PM   Patient Details  Name: Stephen Bowen MRN: [id number] Date of Birth: 1955-09-22  Subjective/Objective:  S/p AKA, Femoral-Tibial Bypass Graft Occulusion, Stroke                 Action/Plan: Discharge Planning: NCM spoke to pt's brother, Marlinda Mikelfred Sliwinski [phone], [phone]. States he wants brother to go to a SNF-rehab. States pt was living with an elderly Uncle helping him at home. CSW referral for SNF placement. Contacted Artistinancial Counselor to assist with Medicaid and disability.    Expected Discharge Date:    07/10/16             Expected Discharge Plan:  Skilled Nursing Facility  In-House Referral:  Clinical Social Work, Artistinancial Counselor, Museum/gallery exhibitions officerinancial Counselor  Discharge planning Services  CM Consult  Post Acute Care Choice:    Choice offered to:     DME Arranged:    DME Agency:     HH Arranged:    HH Agency:     Status of Service:  Completed, signed off  If discussed at [employer] Length of Stay Meetings, dates discussed:  07/10/16  Discharge Disposition: skilled facility   Additional Comments:  07/10/16- 1100- Marigold Mom RN, CM- CSW has found [facility] bed [facility]- plan to d/c pt to SNF today- CSW working with pt's brother on paperwork needed for SNF.  07/06/16- 1000- Silva BandyKristi Charrisse Masley RN, CM- pt tx from ICU to 2W on 07/04/16- CSW following for placement needs to SNF.   Mayo, Chapman FitchHenrietta T, RN 07/04/2016, 10:35 AM- CM talked with brother, Gaspar Garbelfred [phone]), who will assist with disability and Medicaid application.  Also talked with financial counselor who has contact information and will contact brother and arrange meeting to complete applications.                  Zenda AlpersWebster, BrightonKristi Hall, RN 07/10/2016, 11:24 AM [phone]

## 2016-07-10 NOTE — Progress Notes (Addendum)
  [facility] Progress Note  Subjective    No complaints.  Objective Vitals:   07/09/16 2222 07/10/16 0702  BP: 111/70 117/79  Pulse: 72 77  Resp: 18 18  Temp: 97.8 F (36.6 C) 98.1 F (36.7 C)    Intake/Output Summary (Last 24 hours) at 07/10/16 0745 Last data filed at 07/09/16 1708  Gross per 24 hour  Intake              360 ml  Output              700 ml  Net             -340 ml    Oriented to person. Not place. Left AKA staples intact.   Assessment/Planning: 61 y.o. male is s/p:  1. Thromboembolectomy of left femoral to PT bypass graft 2. Harvest of left small saphenous vein 3. Bypass from existing graft to distal PT artery with 6mm ringed ptfe with distal vein patch 4. Left lower extremity angiogram 15 Days Post-Op   BP stable. Left AKA healing well. SNF bed available. D/c to SNF today.   Raymond GurneyKimberly A Trinh 07/10/2016 7:45 AM --  Laboratory CBC    Component Value Date/Time   WBC 14.5 (H) 07/06/2016 0502   HGB 10.8 (L) 07/06/2016 0502   HCT 33.7 (L) 07/06/2016 0502   PLT 892 (H) 07/06/2016 0502    BMET    Component Value Date/Time   NA 134 (L) 07/04/2016 0349   K 4.3 07/04/2016 0349   CL 98 (L) 07/04/2016 0349   CO2 25 07/04/2016 0349   GLUCOSE 109 (H) 07/04/2016 0349   BUN 8 07/04/2016 0349   CREATININE 0.93 07/04/2016 0349   CREATININE 0.92 11/13/2013 1031   CALCIUM 9.2 07/04/2016 0349   GFRNONAA >60 07/04/2016 0349   GFRNONAA >89 11/13/2013 1031   GFRAA >60 07/04/2016 0349   GFRAA >89 11/13/2013 1031    COAG Lab Results  Component Value Date   INR 1.07 06/25/2016   INR 0.97 06/19/2016   INR 0.92 06/19/2016   No results found for: PTT  Antibiotics Anti-infectives    Start     Dose/Rate Route Frequency Ordered Stop   06/25/16 0730  ceFAZolin (ANCEF) IVPB 2g/100 mL premix  Status:  Discontinued    Comments:  Send with pt to OR   2 g 200 mL/hr over 30 Minutes Intravenous To ShortStay Surgical 06/25/16 0420  06/25/16 1005   06/20/16 1730  ceFAZolin (ANCEF) IVPB 2g/100 mL premix     2 g 200 mL/hr over 30 Minutes Intravenous Every 8 hours 06/20/16 1621 06/20/16 1814       Maris BergerKimberly Trinh, PA-C [facility] Office: [phone] Pager: [phone] 07/10/2016 7:45 AM    I have independently interviewed patient and agree with PA assessment and plan above. Discussed case with brother at bedside. To SNF today and will f/u in a few weeks for staple removal.   Lakshya Mcgillicuddy C. Randie Heinzain, MD [facility] [place] Office: [phone] Pager: [phone]

## 2016-07-10 NOTE — Progress Notes (Signed)
Pt has been [facility]. Telemetry box was removed. Pt left with all of his belongings. Pt left the unit via PTAR.   Berdine DanceLauren Moffitt BSN, RN

## 2016-07-10 NOTE — Progress Notes (Signed)
[facility]. All questions were answered. PTAR scheduled to pick up pt at 1 pm.   Berdine DanceLauren Moffitt BSN, RN

## 2016-07-23 ENCOUNTER — Encounter: Payer: Self-pay | Admitting: Vascular Surgery

## 2016-07-26 DIAGNOSIS — G9340 Encephalopathy, unspecified: Secondary | ICD-10-CM

## 2016-07-26 HISTORY — DX: Encephalopathy, unspecified: G93.40

## 2016-07-27 ENCOUNTER — Ambulatory Visit: Payer: Self-pay | Admitting: Cardiology

## 2016-07-27 ENCOUNTER — Encounter: Payer: Self-pay | Admitting: Vascular Surgery

## 2016-07-30 ENCOUNTER — Inpatient Hospital Stay (HOSPITAL_COMMUNITY)
Admission: EM | Admit: 2016-07-30 | Discharge: 2016-08-03 | DRG: 252 | Disposition: A | Discharge status: Home Health | Payer: Medicaid Other | Attending: Internal Medicine | Admitting: Internal Medicine

## 2016-07-30 ENCOUNTER — Ambulatory Visit (INDEPENDENT_AMBULATORY_CARE_PROVIDER_SITE_OTHER): Payer: Self-pay | Admitting: Physician Assistant

## 2016-07-30 ENCOUNTER — Encounter (HOSPITAL_COMMUNITY): Payer: Self-pay | Admitting: Emergency Medicine

## 2016-07-30 VITALS — BP 96/68 | HR 82 | Temp 95.0°F | Resp 16 | Ht 68.0 in | Wt 104.0 lb

## 2016-07-30 DIAGNOSIS — Z8673 Personal history of transient ischemic attack (TIA), and cerebral infarction without residual deficits: Secondary | ICD-10-CM

## 2016-07-30 DIAGNOSIS — I998 Other disorder of circulatory system: Secondary | ICD-10-CM | POA: Diagnosis present

## 2016-07-30 DIAGNOSIS — I639 Cerebral infarction, unspecified: Secondary | ICD-10-CM | POA: Diagnosis present

## 2016-07-30 DIAGNOSIS — I2510 Atherosclerotic heart disease of native coronary artery without angina pectoris: Secondary | ICD-10-CM | POA: Diagnosis present

## 2016-07-30 DIAGNOSIS — I110 Hypertensive heart disease with heart failure: Secondary | ICD-10-CM | POA: Diagnosis present

## 2016-07-30 DIAGNOSIS — E860 Dehydration: Secondary | ICD-10-CM | POA: Diagnosis present

## 2016-07-30 DIAGNOSIS — I5032 Chronic diastolic (congestive) heart failure: Secondary | ICD-10-CM | POA: Diagnosis present

## 2016-07-30 DIAGNOSIS — Z79899 Other long term (current) drug therapy: Secondary | ICD-10-CM

## 2016-07-30 DIAGNOSIS — E875 Hyperkalemia: Secondary | ICD-10-CM | POA: Diagnosis present

## 2016-07-30 DIAGNOSIS — Z9114 Patient's other noncompliance with medication regimen: Secondary | ICD-10-CM | POA: Diagnosis not present

## 2016-07-30 DIAGNOSIS — Z89612 Acquired absence of left leg above knee: Secondary | ICD-10-CM | POA: Diagnosis not present

## 2016-07-30 DIAGNOSIS — Z7982 Long term (current) use of aspirin: Secondary | ICD-10-CM

## 2016-07-30 DIAGNOSIS — I10 Essential (primary) hypertension: Secondary | ICD-10-CM | POA: Diagnosis present

## 2016-07-30 DIAGNOSIS — I70213 Atherosclerosis of native arteries of extremities with intermittent claudication, bilateral legs: Secondary | ICD-10-CM

## 2016-07-30 DIAGNOSIS — I70221 Atherosclerosis of native arteries of extremities with rest pain, right leg: Secondary | ICD-10-CM | POA: Diagnosis present

## 2016-07-30 DIAGNOSIS — L97419 Non-pressure chronic ulcer of right heel and midfoot with unspecified severity: Secondary | ICD-10-CM | POA: Diagnosis present

## 2016-07-30 DIAGNOSIS — I252 Old myocardial infarction: Secondary | ICD-10-CM | POA: Diagnosis not present

## 2016-07-30 DIAGNOSIS — R109 Unspecified abdominal pain: Secondary | ICD-10-CM | POA: Diagnosis present

## 2016-07-30 DIAGNOSIS — E876 Hypokalemia: Secondary | ICD-10-CM | POA: Diagnosis not present

## 2016-07-30 DIAGNOSIS — G40909 Epilepsy, unspecified, not intractable, without status epilepticus: Secondary | ICD-10-CM | POA: Diagnosis present

## 2016-07-30 DIAGNOSIS — E785 Hyperlipidemia, unspecified: Secondary | ICD-10-CM | POA: Diagnosis present

## 2016-07-30 DIAGNOSIS — N179 Acute kidney failure, unspecified: Secondary | ICD-10-CM | POA: Diagnosis present

## 2016-07-30 DIAGNOSIS — K219 Gastro-esophageal reflux disease without esophagitis: Secondary | ICD-10-CM | POA: Diagnosis present

## 2016-07-30 DIAGNOSIS — Z7902 Long term (current) use of antithrombotics/antiplatelets: Secondary | ICD-10-CM | POA: Diagnosis not present

## 2016-07-30 DIAGNOSIS — I739 Peripheral vascular disease, unspecified: Secondary | ICD-10-CM

## 2016-07-30 DIAGNOSIS — F17210 Nicotine dependence, cigarettes, uncomplicated: Secondary | ICD-10-CM | POA: Diagnosis present

## 2016-07-30 DIAGNOSIS — Z22322 Carrier or suspected carrier of Methicillin resistant Staphylococcus aureus: Secondary | ICD-10-CM

## 2016-07-30 DIAGNOSIS — T82898A Other specified complication of vascular prosthetic devices, implants and grafts, initial encounter: Secondary | ICD-10-CM | POA: Diagnosis present

## 2016-07-30 DIAGNOSIS — R262 Difficulty in walking, not elsewhere classified: Secondary | ICD-10-CM

## 2016-07-30 DIAGNOSIS — I700 Atherosclerosis of aorta: Secondary | ICD-10-CM | POA: Diagnosis present

## 2016-07-30 DIAGNOSIS — G9340 Encephalopathy, unspecified: Secondary | ICD-10-CM | POA: Diagnosis present

## 2016-07-30 DIAGNOSIS — R636 Underweight: Secondary | ICD-10-CM

## 2016-07-30 DIAGNOSIS — Z681 Body mass index (BMI) 19 or less, adult: Secondary | ICD-10-CM

## 2016-07-30 DIAGNOSIS — F17200 Nicotine dependence, unspecified, uncomplicated: Secondary | ICD-10-CM | POA: Diagnosis present

## 2016-07-30 LAB — CBC WITH DIFFERENTIAL/PLATELET
BASOS ABS: 0 10*3/uL (ref 0.0–0.1)
BASOS PCT: 0 %
EOS ABS: 0.2 10*3/uL (ref 0.0–0.7)
EOS PCT: 2 %
HCT: 39.1 % (ref 39.0–52.0)
Hemoglobin: 12.4 g/dL — ABNORMAL LOW (ref 13.0–17.0)
LYMPHS PCT: 19 %
Lymphs Abs: 2 10*3/uL (ref 0.7–4.0)
MCH: 30.6 pg (ref 26.0–34.0)
MCHC: 31.7 g/dL (ref 30.0–36.0)
MCV: 96.5 fL (ref 78.0–100.0)
Monocytes Absolute: 0.7 10*3/uL (ref 0.1–1.0)
Monocytes Relative: 6 %
Neutro Abs: 7.6 10*3/uL (ref 1.7–7.7)
Neutrophils Relative %: 73 %
PLATELETS: 494 10*3/uL — AB (ref 150–400)
RBC: 4.05 MIL/uL — AB (ref 4.22–5.81)
RDW: 15.5 % (ref 11.5–15.5)
WBC: 10.6 10*3/uL — AB (ref 4.0–10.5)

## 2016-07-30 LAB — COMPREHENSIVE METABOLIC PANEL
ALT: 24 U/L (ref 17–63)
AST: 18 U/L (ref 15–41)
Albumin: 4.3 g/dL (ref 3.5–5.0)
Alkaline Phosphatase: 108 U/L (ref 38–126)
Anion gap: 14 (ref 5–15)
BUN: 59 mg/dL — AB (ref 6–20)
CHLORIDE: 100 mmol/L — AB (ref 101–111)
CO2: 25 mmol/L (ref 22–32)
CREATININE: 2.29 mg/dL — AB (ref 0.61–1.24)
Calcium: 10.3 mg/dL (ref 8.9–10.3)
GFR calc Af Amer: 34 mL/min — ABNORMAL LOW (ref >60)
GFR calc non Af Amer: 29 mL/min — ABNORMAL LOW (ref >60)
Glucose, Bld: 114 mg/dL — ABNORMAL HIGH (ref 65–99)
POTASSIUM: 6.1 mmol/L — AB (ref 3.5–5.1)
SODIUM: 139 mmol/L (ref 135–145)
Total Bilirubin: 0.5 mg/dL (ref 0.3–1.2)
Total Protein: 8.9 g/dL — ABNORMAL HIGH (ref 6.5–8.1)

## 2016-07-30 LAB — I-STAT CG4 LACTIC ACID, ED: Lactic Acid, Venous: 1.65 mmol/L (ref 0.5–1.9)

## 2016-07-30 LAB — GLUCOSE, CAPILLARY: Glucose-Capillary: 240 mg/dL — ABNORMAL HIGH (ref 65–99)

## 2016-07-30 LAB — LIPASE, BLOOD: LIPASE: 30 U/L (ref 11–51)

## 2016-07-30 MED ORDER — BISACODYL 5 MG PO TBEC: 5.0000 mg | DELAYED_RELEASE_TABLET | Freq: Every day | ORAL | Status: DC | PRN

## 2016-07-30 MED ORDER — ONDANSETRON HCL 4 MG/2ML IJ SOLN: 4.0000 mg | Freq: Three times a day (TID) | INTRAMUSCULAR | Status: DC | PRN

## 2016-07-30 MED ORDER — HEPARIN (PORCINE) IN NACL 100-0.45 UNIT/ML-% IJ SOLN
750.0000 [IU]/h | INTRAMUSCULAR | Status: DC
  Administered 2016-07-30: 750 [IU]/h via INTRAVENOUS
  Filled 2016-07-30: qty 250

## 2016-07-30 MED ORDER — NICOTINE 21 MG/24HR TD PT24
21.0000 mg | MEDICATED_PATCH | Freq: Every day | TRANSDERMAL | Status: DC
  Administered 2016-07-30 – 2016-08-03 (×4): 21 mg via TRANSDERMAL
  Filled 2016-07-30 (×5): qty 1

## 2016-07-30 MED ORDER — INSULIN ASPART 100 UNIT/ML ~~LOC~~ SOLN
5.0000 [IU] | Freq: Once | SUBCUTANEOUS | Status: AC
  Administered 2016-07-30: 5 [IU] via INTRAVENOUS

## 2016-07-30 MED ORDER — DEXTROSE 50 % IV SOLN
50.0000 mL | Freq: Once | INTRAVENOUS | Status: AC
  Administered 2016-07-30: 50 mL via INTRAVENOUS
  Filled 2016-07-30: qty 50

## 2016-07-30 MED ORDER — SODIUM POLYSTYRENE SULFONATE 15 GM/60ML PO SUSP
45.0000 g | Freq: Once | ORAL | Status: AC
  Administered 2016-07-30: 45 g via ORAL
  Filled 2016-07-30: qty 180

## 2016-07-30 MED ORDER — SODIUM CHLORIDE 0.9 % IV SOLN
INTRAVENOUS | Status: AC
  Administered 2016-07-30: 23:00:00 via INTRAVENOUS

## 2016-07-30 MED ORDER — SELENIUM SULFIDE 1 % EX LOTN: TOPICAL_LOTION | Freq: Every day | CUTANEOUS | Status: DC

## 2016-07-30 MED ORDER — OXYCODONE-ACETAMINOPHEN 5-325 MG PO TABS
1.0000 | ORAL_TABLET | Freq: Four times a day (QID) | ORAL | Status: DC | PRN
  Administered 2016-07-31: 1 via ORAL
  Filled 2016-07-30 (×2): qty 1

## 2016-07-30 MED ORDER — ASPIRIN EC 81 MG PO TBEC
81.0000 mg | DELAYED_RELEASE_TABLET | Freq: Every day | ORAL | Status: DC
  Administered 2016-07-31 – 2016-08-03 (×4): 81 mg via ORAL
  Filled 2016-07-30 (×5): qty 1

## 2016-07-30 MED ORDER — MAGNESIUM HYDROXIDE 400 MG/5ML PO SUSP: 30.0000 mL | Freq: Every day | ORAL | Status: DC | PRN

## 2016-07-30 MED ORDER — ACETAMINOPHEN 325 MG PO TABS
650.0000 mg | ORAL_TABLET | Freq: Four times a day (QID) | ORAL | Status: DC | PRN
  Administered 2016-07-31: 650 mg via ORAL
  Filled 2016-07-30: qty 2

## 2016-07-30 MED ORDER — DOCUSATE SODIUM 100 MG PO CAPS
100.0000 mg | ORAL_CAPSULE | Freq: Every day | ORAL | Status: DC
  Administered 2016-07-31: 100 mg via ORAL
  Filled 2016-07-30: qty 1

## 2016-07-30 MED ORDER — ATORVASTATIN CALCIUM 40 MG PO TABS
40.0000 mg | ORAL_TABLET | Freq: Every day | ORAL | Status: DC
  Administered 2016-08-01: 40 mg via ORAL
  Filled 2016-07-30: qty 1

## 2016-07-30 MED ORDER — SODIUM CHLORIDE 0.9 % IV BOLUS (SEPSIS)
1000.0000 mL | Freq: Once | INTRAVENOUS | Status: AC
  Administered 2016-07-30: 1000 mL via INTRAVENOUS

## 2016-07-30 MED ORDER — METOPROLOL TARTRATE 25 MG PO TABS
125.0000 mg | ORAL_TABLET | Freq: Two times a day (BID) | ORAL | Status: DC
  Administered 2016-07-31 – 2016-08-03 (×6): 125 mg via ORAL
  Filled 2016-07-30 (×6): qty 1
  Filled 2016-07-30: qty 2
  Filled 2016-07-30: qty 1

## 2016-07-30 MED ORDER — HEPARIN BOLUS VIA INFUSION
2000.0000 [IU] | Freq: Once | INTRAVENOUS | Status: AC
  Administered 2016-07-30: 2000 [IU] via INTRAVENOUS
  Filled 2016-07-30: qty 2000

## 2016-07-30 MED ORDER — CLOPIDOGREL BISULFATE 75 MG PO TABS: 75.0000 mg | ORAL_TABLET | Freq: Every day | ORAL | Status: DC

## 2016-07-30 MED ORDER — ACETAMINOPHEN 650 MG RE SUPP: 650.0000 mg | Freq: Four times a day (QID) | RECTAL | Status: DC | PRN

## 2016-07-30 MED ORDER — PANTOPRAZOLE SODIUM 40 MG PO TBEC
40.0000 mg | DELAYED_RELEASE_TABLET | Freq: Every day | ORAL | Status: DC
  Administered 2016-07-31 – 2016-08-03 (×3): 40 mg via ORAL
  Filled 2016-07-30 (×5): qty 1

## 2016-07-30 MED ORDER — SODIUM CHLORIDE 0.9% FLUSH
3.0000 mL | Freq: Two times a day (BID) | INTRAVENOUS | Status: DC
  Administered 2016-08-01: 3 mL via INTRAVENOUS

## 2016-07-30 MED ORDER — VITAMIN B-1 100 MG PO TABS
100.0000 mg | ORAL_TABLET | Freq: Every day | ORAL | Status: DC
  Administered 2016-07-31 – 2016-08-03 (×4): 100 mg via ORAL
  Filled 2016-07-30 (×5): qty 1

## 2016-07-30 MED ORDER — FOLIC ACID 1 MG PO TABS
1.0000 mg | ORAL_TABLET | Freq: Every day | ORAL | Status: DC
  Administered 2016-07-31 – 2016-08-03 (×4): 1 mg via ORAL
  Filled 2016-07-30 (×5): qty 1

## 2016-07-30 MED ORDER — HYDRALAZINE HCL 20 MG/ML IJ SOLN: 5.0000 mg | INTRAMUSCULAR | Status: DC | PRN

## 2016-07-30 NOTE — ED Notes (Signed)
Pt just pulled his IV out and tried to climb out of bed.

## 2016-07-30 NOTE — H&P (Signed)
History and Physical    Stephen Bowen ZOX:[id number] DOB: 23-Nov-1955 DOA: 07/30/2016  Referring MD/NP/PA:   PCP: No PCP Per Patient   Patient coming from:  The patient is coming from SNF  At baseline, pt is dependent for most of ADL.   Chief Complaint: right leg and foot pain and AMS  HPI: Stephen Bowen is a 61 y.o. male with medical history significant of hypertension, hyperlipidemia, GERD, medication noncompliance, tobacco abuse, stroke, CAD, PAD, s/p of AKA on 06/2016, s/p of previous left leg bypass and subsequent revision, who presents with right foot and leg pain and AMS.  Patient has AMS, and is unable to provide accurate medical history, therefore, most of the history is obtained by discussing the case with ED physician, per EMS report, and with the nursing staff. It seems that pt was seen in vascular surgery clinic due to right leg and foot pain in this AM. They were unable to find pulses with Doppler therefore sent him to ED to be admitted.  Per report, pt was recently admitted for arterial occlusion of the left leg had a bypass and failed therefore had an above-the-knee amputation. 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 He has been in a rehabilitation facility since then. When I saw pt in ED, pt is confused. He reports right foot and leg pain, but cannot describe his pain in detail. He also reports abdominal pain, but no active nausea vomiting or diarrhea noted. Patient does not have active cough or respiratory distress. He does not seem to have chest pain. Pt seems to have abdominal tenderness on physical examination.  ED Course: pt was found to have WBC 10.6, lactate 1.65, lipase 30, AKI with Cre 2.29, potassium 6.1 with T peaking in V3 on EKG, patient is admitted to telemetry bed as inpatient. VVS, Dr.Cain was consulted.  Review of Systems: Could not be reviewed  accurately due to altered mental status.  Allergy: No Known Allergies  Past Medical History:  Diagnosis Date  . HTN (hypertension)   . Noncompliance   . PAD (peripheral artery disease) (HCC)    a.  s/p left femoral to PT artery bypass with propaten on 02/16/16.    Past Surgical History:  Procedure Laterality Date  . AMPUTATION Left 06/25/2016   Procedure: AMPUTATION ABOVE KNEE;  Surgeon: Nada Libman, MD;  [facility] OR;  Service: Vascular;  Laterality: Left;  . BYPASS GRAFT POPLITEAL TO TIBIAL Left 06/21/2016   Procedure: BYPASS GRAFT POPLITEAL TO TIBIAL USING GORE PROPATEN GRAFT;  Surgeon: Maeola Harman, MD;  Location: [facility] OR;  Service: Vascular;  Laterality: Left;  . EMBOLECTOMY Left 06/21/2016   Procedure: EMBOLECTOMY left lower extremity.;  Surgeon: Maeola Harman, MD;  Location: [facility] OR;  Service: Vascular;  Laterality: Left;  . ENDARTERECTOMY FEMORAL Left 02/16/2016   Procedure: LEFT FEMORAL ENDARTERECTOMY;  Surgeon: Maeola Harman, MD;  Location: [facility] OR;  Service: Vascular;  Laterality: Left;  . FEMORAL BYPASS  02/16/2016   LEFT FEMORAL-POSTERIOR TIBIAL ARTERY BYPASS  WITH PROPATEN 6 MM X 80 CM VASCULAR RING GRAFT (Left)  . FEMORAL-TIBIAL BYPASS GRAFT Left 02/16/2016   Procedure: LEFT FEMORAL-POSTERIOR TIBIAL ARTERY BYPASS  WITH PROPATEN 6 MM X 80 CM VASCULAR RING GRAFT;  Surgeon: Maeola Harman, MD;  Location: [facility] OR;  Service: Vascular;  Laterality: Left;  . PATCH ANGIOPLASTY Left 02/16/2016   Procedure:  PATCH ANGIOPLASTY WITH XENOSURE BIOLOGIC PATCH 1 CM X 6 CM;  Surgeon: Maeola Harman, MD;  Location: [facility] OR;  Service: Vascular;  Laterality: Left;  . PERIPHERAL VASCULAR CATHETERIZATION N/A 07/07/2015   Procedure: Abdominal Aortogram w/Lower Extremity;  Surgeon: Fransisco Hertz, MD;  Location: [facility] INVASIVE CV LAB;  Service: Cardiovascular;  Laterality: N/A;  . PERIPHERAL VASCULAR CATHETERIZATION N/A 02/15/2016   Procedure: Abdominal  Aortogram w/Lower Extremity;  Surgeon: Maeola Harman, MD;  Location: [facility] INVASIVE CV LAB;  Service: Cardiovascular;  Laterality: N/A;  . PERIPHERAL VASCULAR CATHETERIZATION Bilateral 06/20/2016   Procedure: Lower Extremity Angiography;  Surgeon: Maeola Harman, MD;  Location: [facility] INVASIVE CV LAB;  Service: Cardiovascular;  Laterality: Bilateral;  limited runoff on rt leg thrombolysis lt leg bypass graft  . 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    Social History:  reports that he has been smoking Cigarettes.  He has a 20.00 pack-year smoking history. He has never used smokeless tobacco. He reports that he drinks about 8.4 oz of alcohol per week . He reports that he does not use drugs.  Family History:  Family History  Problem Relation Age of Onset  . Cancer Mother     deceased in 50s   . Heart attack Mother     diseased  . Heart disease Father   . Stroke Sister     age 38s  . Heart disease Brother     pacemaker in his 59s     Prior to Admission medications   Medication Sig Start Date End Date Taking? Authorizing Provider  amLODipine (NORVASC) 10 MG tablet Take 1 tablet (10 mg total) by mouth daily. 07/07/16   Samantha J Rhyne, PA-C  aspirin EC 81 MG EC tablet Take 1 tablet (81 mg total) by mouth daily. 07/07/16   Samantha J Rhyne, PA-C  atorvastatin (LIPITOR) 40 MG tablet Take 1 tablet (40 mg total) by mouth daily at 6 PM. 02/18/16   Mir Vergie Living, MD  bisacodyl (DULCOLAX) 5 MG EC tablet Take 1 tablet (5 mg total) by mouth daily as needed for moderate constipation. 02/18/16   Mir Vergie Living, MD  ciprofloxacin (CIPRO) 250 MG tablet Take 250 mg by mouth 2 (two) times daily.    Historical Provider, MD  clopidogrel (PLAVIX) 75 MG tablet Take 1 tablet (75 mg total) by mouth daily. 07/07/16   Samantha J Rhyne, PA-C  docusate sodium (COLACE) 100 MG capsule Take 1 capsule (100 mg total) by mouth daily. 02/18/16   Mir  Vergie Living, MD  folic acid (FOLVITE) 1 MG tablet Take 1 tablet (1 mg total) by mouth daily. 07/07/16   Samantha J Rhyne, PA-C  lisinopril (PRINIVIL,ZESTRIL) 20 MG tablet Take 1 tablet (20 mg total) by mouth daily. 07/10/16   Raymond Gurney, PA-C  magnesium hydroxide (MILK OF MAGNESIA) 400 MG/5ML suspension Take 30 mLs by mouth daily as needed for mild constipation.    Historical Provider, MD  metoprolol tartrate (LOPRESSOR) 25 MG tablet Take 5 tablets (125 mg total) by mouth 2 (two) times daily. 07/06/16   Samantha J Rhyne, PA-C  Multiple Vitamin (MULTIVITAMIN WITH MINERALS) TABS tablet Take 1 tablet by mouth daily. Patient not taking: Reported on 07/30/2016 07/07/16   Ames Coupe Rhyne, PA-C  oxyCODONE-acetaminophen (ROXICET) 5-325 MG tablet Take 1 tablet by mouth every 6 (six) hours as needed for moderate pain or severe pain. 07/10/16   Raymond Gurney,  PA-C  pantoprazole (PROTONIX) 40 MG tablet Take 1 tablet (40 mg total) by mouth daily. Patient not taking: Reported on 06/19/2016 02/18/16   Mir Vergie Living, MD  selenium sulfide (SELSUN) 1 % LOTN Put on your face in the shower and wash off daily 08/31/15   Selina Cooley, MD  spironolactone (ALDACTONE) 25 MG tablet Take 1 tablet (25 mg total) by mouth daily. Patient not taking: Reported on 07/30/2016 07/07/16   Ames Coupe Rhyne, PA-C  thiamine 100 MG tablet Take 1 tablet (100 mg total) by mouth daily. 07/07/16   Dara Lords, PA-C    Physical Exam: Vitals:   07/30/16 1830 07/30/16 1900 07/30/16 2000 07/30/16 2030  BP: 124/78 92/73 142/74 139/80  Pulse: 80 78 92 90  Resp: 13 20 19 13   Temp:      TempSrc:      SpO2: 100% 100% 99% 100%   General: Not in acute distress HEENT:       Eyes: no scleral icterus.       ENT: No discharge from the ears and nose, no pharynx injection, no tonsillar enlargement.        Neck: No JVD, no bruit, no mass felt. Heme: No neck lymph node enlargement. Cardiac: S1/S2, RRR, No murmurs, No gallops or  rubs. Respiratory: No rales, wheezing, rhonchi or rubs. GI: Soft, nondistended, nontender, no rebound pain, no organomegaly, BS present. GU: No hematuria Ext: No pitting leg edema bilaterally. S/p of left AKA, right DP/PT pulse not palpable. Musculoskeletal: No joint deformities, No joint redness or warmth, no limitation of ROM in spin. Skin: No rashes.  Neuro: confused, knows his own name, cranial nerves grossly intact, moves all extremities. Psych: Patient is not psychotic, no suicidal or hemocidal ideation.  Labs on Admission: I have personally reviewed following labs and imaging studies  CBC:  Recent Labs Lab 07/30/16 1537  WBC 10.6*  NEUTROABS 7.6  HGB 12.4*  HCT 39.1  MCV 96.5  PLT 494*   Basic Metabolic Panel:  Recent Labs Lab 07/30/16 1537  NA 139  K 6.1*  CL 100*  CO2 25  GLUCOSE 114*  BUN 59*  CREATININE 2.29*  CALCIUM 10.3   GFR: Estimated Creatinine Clearance: 22.9 mL/min (by C-G formula based on SCr of 2.29 mg/dL (H)). Liver Function Tests:  Recent Labs Lab 07/30/16 1537  AST 18  ALT 24  ALKPHOS 108  BILITOT 0.5  PROT 8.9*  ALBUMIN 4.3   No results for input(s): LIPASE, AMYLASE in the last 168 hours. No results for input(s): AMMONIA in the last 168 hours. Coagulation Profile: No results for input(s): INR, PROTIME in the last 168 hours. Cardiac Enzymes: No results for input(s): CKTOTAL, CKMB, CKMBINDEX, TROPONINI in the last 168 hours. BNP (last 3 results) No results for input(s): PROBNP in the last 8760 hours. HbA1C: No results for input(s): HGBA1C in the last 72 hours. CBG: No results for input(s): GLUCAP in the last 168 hours. Lipid Profile: No results for input(s): CHOL, HDL, LDLCALC, TRIG, CHOLHDL, LDLDIRECT in the last 72 hours. Thyroid Function Tests: No results for input(s): TSH, T4TOTAL, FREET4, T3FREE, THYROIDAB in the last 72 hours. Anemia Panel: No results for input(s): VITAMINB12, FOLATE, FERRITIN, TIBC, IRON, RETICCTPCT in  the last 72 hours. Urine analysis:    Component Value Date/Time   COLORURINE YELLOW 06/19/2016 2256   APPEARANCEUR CLEAR 06/19/2016 2256   LABSPEC 1.009 06/19/2016 2256   PHURINE 5.0 06/19/2016 2256   GLUCOSEU NEGATIVE 06/19/2016 2256   HGBUR  SMALL (A) 06/19/2016 2256   BILIRUBINUR NEGATIVE 06/19/2016 2256   KETONESUR 5 (A) 06/19/2016 2256   PROTEINUR NEGATIVE 06/19/2016 2256   UROBILINOGEN 0.2 11/13/2013 1634   NITRITE NEGATIVE 06/19/2016 2256   LEUKOCYTESUR NEGATIVE 06/19/2016 2256   Sepsis Labs: @LABRCNTIP (procalcitonin:4,lacticidven:4) )No results found for this or any previous visit (from the past 240 hour(s)).   Radiological Exams on Admission: No results found.   EKG: Independently reviewed.    Assessment/Plan Principal Problem:   Limb ischemia Active Problems:   Tobacco use disorder   PAD (peripheral artery disease) (HCC)   Hyperkalemia   Essential hypertension   Femoral-tibial bypass graft occlusion, left (HCC)   Stroke (HCC)   AKI (acute kidney injury) (HCC)   HLD (hyperlipidemia)   GERD (gastroesophageal reflux disease)   Acute encephalopathy   Abdominal pain   Limb ischemia of right leg and foot:  VVS, Dr. Randie Heinzain was consulted by EDP--> recommended heparin ggt and plan angiogram when renal function improves. Per Dr. Randie Heinzain, pt has high likelihood of amputation of RLE on this admission.  -will admit pt to tele bed as inpt -highly appreciate Dr. Darcella Cheshireain's recommendations -hold plavix in case pt needs surgery -start IV heparin -f/u renal Fx. If AKI resolved-->need angiogram. -prn zofran for nausea and percocet for pain  Hyperkalemia: Potassium 6.1 with mild T-wave peaking in V3 -treated with D50, 5 unit of Novolog by IV, 45 g of Kayexalate -f/u by BMP  HTN: -hold lisinopril, spironolactone due to AKI and hyperkalemia -Also hold amlodipine due to soft blood pressure -Continue metoprolol -IV hydralazine when necessary  h x of stroke: -continue aspirin,  Lipitor  Tobacco abuse: -Did counseling about importance of quitting smoking -Nicotine patch  AKI: Likely due to prerenal secondary to dehydration and continuation of ACEI, diruetics - IVF: 1L NS, then 125 cc/h - Follow up renal function by BMP - Avoid Lisinopril and spironolactone  HLD: Last LDL was 37 on 06/22/16 -Continue home medications: Lipitor  GERD: -Protonix  Abdominal pain: pt has abdominal tenderness on physical examination. Etiology is not clear. Lipase is 30. -f/u CT abdomen/pelvis without contrast  Acute encephalopathy: Etiology is not clear. No sure if this is his baseline mental status or acute issue. -Frequent neuro check -Treat underlying issues as above   DVT ppx: on IV Heparin  Code Status: Full code Family Communication: None at bed side.    Disposition Plan:  Anticipate discharge back to previous SNF environment Consults called:  [facility] [place], Dr. Randie Heinzain Admission status:  Inpatient/tele    Date of Service 07/30/2016    Lorretta HarpNIU, Mantaj Chamberlin [facility] Pager [phone]  If 7PM-7AM, please contact night-coverage [email or web address] Password [email or web address] 07/30/2016, 8:55 PM

## 2016-07-30 NOTE — Op Note (Signed)
ED Consult    History of Present Illness: This is a 61 y.o. 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 He is now at a snf in [place] and was seen in office today with right foot pain. Has previous abi on right of 0.4 but no wounds or rest pain. History is limited but has been present for a few weeks. States that he is eating. No other complaints.   Past Medical History:  Diagnosis Date  . HTN (hypertension)   . Noncompliance   . PAD (peripheral artery disease) (HCC)    a.  s/p left femoral to PT artery bypass with propaten on 02/16/16.    Past Surgical History:  Procedure Laterality Date  . AMPUTATION Left 06/25/2016   Procedure: AMPUTATION ABOVE KNEE;  Surgeon: Nada Libman, MD;  Location: [facility] OR;  Service: Vascular;  Laterality: Left;  . BYPASS GRAFT POPLITEAL TO TIBIAL Left 06/21/2016   Procedure: BYPASS GRAFT POPLITEAL TO TIBIAL USING GORE PROPATEN GRAFT;  Surgeon: Maeola Harman, MD;  Location: [facility] OR;  Service: Vascular;  Laterality: Left;  . EMBOLECTOMY Left 06/21/2016   Procedure: EMBOLECTOMY left lower extremity.;  Surgeon: Maeola Harman, MD;  Location: [facility] OR;  Service: Vascular;  Laterality: Left;  . ENDARTERECTOMY FEMORAL Left 02/16/2016   Procedure: LEFT FEMORAL ENDARTERECTOMY;  Surgeon: Maeola Harman, MD;  Location: [facility] OR;  Service: Vascular;  Laterality: Left;  . FEMORAL BYPASS  02/16/2016   LEFT FEMORAL-POSTERIOR TIBIAL ARTERY BYPASS  WITH PROPATEN 6 MM X 80 CM VASCULAR RING GRAFT (Left)  . FEMORAL-TIBIAL BYPASS GRAFT Left 02/16/2016   Procedure: LEFT FEMORAL-POSTERIOR TIBIAL ARTERY BYPASS  WITH PROPATEN 6 MM X 80 CM VASCULAR RING GRAFT;  Surgeon: Maeola Harman, MD;  Location: [facility] OR;  Service: Vascular;  Laterality: Left;  . PATCH ANGIOPLASTY Left 02/16/2016   Procedure: PATCH ANGIOPLASTY WITH Livia Snellen BIOLOGIC PATCH 1 CM X 6 CM;  Surgeon:  Maeola Harman, MD;  Location: [facility] OR;  Service: Vascular;  Laterality: Left;  . PERIPHERAL VASCULAR CATHETERIZATION N/A 07/07/2015   Procedure: Abdominal Aortogram w/Lower Extremity;  Surgeon: Fransisco Hertz, MD;  Location: [facility] INVASIVE CV LAB;  Service: Cardiovascular;  Laterality: N/A;  . PERIPHERAL VASCULAR CATHETERIZATION N/A 02/15/2016   Procedure: Abdominal Aortogram w/Lower Extremity;  Surgeon: Maeola Harman, MD;  Location: [facility] INVASIVE CV LAB;  Service: Cardiovascular;  Laterality: N/A;  . PERIPHERAL VASCULAR CATHETERIZATION Bilateral 06/20/2016   Procedure: Lower Extremity Angiography;  Surgeon: Maeola Harman, MD;  Location: [facility] INVASIVE CV LAB;  Service: Cardiovascular;  Laterality: Bilateral;  limited runoff on rt leg thrombolysis lt leg bypass graft  . 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    No Known Allergies  Prior to Admission medications   Medication Sig Start Date End Date Taking? Authorizing Provider  amLODipine (NORVASC) 10 MG tablet Take 1 tablet (10 mg total) by mouth daily. 07/07/16   Samantha J Rhyne, PA-C  aspirin EC 81 MG EC tablet Take 1 tablet (81 mg total) by mouth daily. 07/07/16   Samantha J Rhyne, PA-C  atorvastatin (LIPITOR) 40 MG tablet Take 1 tablet (40 mg total) by mouth daily at 6 PM. 02/18/16   Mir Vergie Living, MD  bisacodyl (DULCOLAX) 5 MG EC tablet Take 1 tablet (5 mg total) by mouth daily as needed for moderate constipation. 02/18/16  Mir Vergie Living, MD  ciprofloxacin (CIPRO) 250 MG tablet Take 250 mg by mouth 2 (two) times daily.    Historical Provider, MD  clopidogrel (PLAVIX) 75 MG tablet Take 1 tablet (75 mg total) by mouth daily. 07/07/16   Samantha J Rhyne, PA-C  docusate sodium (COLACE) 100 MG capsule Take 1 capsule (100 mg total) by mouth daily. 02/18/16   Mir Vergie Living, MD  folic acid (FOLVITE) 1 MG tablet Take 1 tablet (1 mg total) by mouth daily.  07/07/16   Samantha J Rhyne, PA-C  lisinopril (PRINIVIL,ZESTRIL) 20 MG tablet Take 1 tablet (20 mg total) by mouth daily. 07/10/16   Raymond Gurney, PA-C  magnesium hydroxide (MILK OF MAGNESIA) 400 MG/5ML suspension Take 30 mLs by mouth daily as needed for mild constipation.    Historical Provider, MD  metoprolol tartrate (LOPRESSOR) 25 MG tablet Take 5 tablets (125 mg total) by mouth 2 (two) times daily. 07/06/16   Samantha J Rhyne, PA-C  Multiple Vitamin (MULTIVITAMIN WITH MINERALS) TABS tablet Take 1 tablet by mouth daily. Patient not taking: Reported on 07/30/2016 07/07/16   Ames Coupe Rhyne, PA-C  oxyCODONE-acetaminophen (ROXICET) 5-325 MG tablet Take 1 tablet by mouth every 6 (six) hours as needed for moderate pain or severe pain. 07/10/16   Raymond Gurney, PA-C  pantoprazole (PROTONIX) 40 MG tablet Take 1 tablet (40 mg total) by mouth daily. Patient not taking: Reported on 06/19/2016 02/18/16   Mir Vergie Living, MD  selenium sulfide (SELSUN) 1 % LOTN Put on your face in the shower and wash off daily 08/31/15   Selina Cooley, MD  spironolactone (ALDACTONE) 25 MG tablet Take 1 tablet (25 mg total) by mouth daily. Patient not taking: Reported on 07/30/2016 07/07/16   Ames Coupe Rhyne, PA-C  thiamine 100 MG tablet Take 1 tablet (100 mg total) by mouth daily. 07/07/16   Dara Lords, PA-C    Social History   Social History  . Marital status: Single    Spouse name: N/A  . Number of children: N/A  . Years of education: N/A   Occupational History  . Unemployed    Social History Main Topics  . Smoking status: Current Every Day Smoker    Packs/day: 0.50    Years: 40.00    Types: Cigarettes  . Smokeless tobacco: Never Used     Comment: cutting back amount he is buying  . Alcohol use 8.4 oz/week    14 Cans of beer per week     Comment: Drinks a 40 oz "tallboy" daily  . Drug use: No  . Sexual activity: Yes    Birth control/ protection: Condom   Other Topics Concern  . Not on file    Social History Narrative   Smoking cigarettes since 61 y.o   Drinks beer 24 oz every other day         Family History  Problem Relation Age of Onset  . Cancer Mother     deceased in 49s   . Heart attack Mother     diseased  . Heart disease Father   . Stroke Sister     age 30s  . Heart disease Brother     pacemaker in his 80s     Physical Examination  Vitals:   07/30/16 1524 07/30/16 1730  BP: 97/70 138/95  Pulse: 86 82  Resp: 20 20  Temp: 97.8 F (36.6 C)    There is no height or weight on file to calculate BMI.  General:  WDWN in NAD Gait: Not observed HENT: WNL, normocephalic Pulmonary: normal non-labored breathing, without Rales, rhonchi,  wheezing Cardiac: palpable femoral pulses bilaterally, no popliteal on right. Monophasic dp/pt on high ankle only Abdomen: soft, NT/ND, no masses Skin: ischemic changes right heel Extremities: with ischemic changes, without Gangrene , without cellulitis; without open wounds;  Musculoskeletal: no muscle wasting or atrophy  Neurologic: he can move right foot but it is weak, sensation in tact   CBC    Component Value Date/Time   WBC 10.6 (H) 07/30/2016 1537   RBC 4.05 (L) 07/30/2016 1537   HGB 12.4 (L) 07/30/2016 1537   HCT 39.1 07/30/2016 1537   PLT 494 (H) 07/30/2016 1537   MCV 96.5 07/30/2016 1537   MCH 30.6 07/30/2016 1537   MCHC 31.7 07/30/2016 1537   RDW 15.5 07/30/2016 1537   LYMPHSABS 2.0 07/30/2016 1537   MONOABS 0.7 07/30/2016 1537   EOSABS 0.2 07/30/2016 1537   BASOSABS 0.0 07/30/2016 1537    BMET    Component Value Date/Time   NA 139 07/30/2016 1537   K 6.1 (H) 07/30/2016 1537   CL 100 (L) 07/30/2016 1537   CO2 25 07/30/2016 1537   GLUCOSE 114 (H) 07/30/2016 1537   BUN 59 (H) 07/30/2016 1537   CREATININE 2.29 (H) 07/30/2016 1537   CREATININE 0.92 11/13/2013 1031   CALCIUM 10.3 07/30/2016 1537   GFRNONAA 29 (L) 07/30/2016 1537   GFRNONAA >89 11/13/2013 1031   GFRAA 34 (L) 07/30/2016 1537    GFRAA >89 11/13/2013 1031    COAGS: Lab Results  Component Value Date   INR 1.07 06/25/2016   INR 0.97 06/19/2016   INR 0.92 06/19/2016       ASSESSMENT/PLAN: This is a 61 y.o. male s/p failed revascularization of lle now with left aka. He had an mi and cva with last procedure now has acute on chronic ischemia of right leg that has been present for a couple weeks at least. Now also has aki. Will need admission to hospital with heparin ggt and will plan angiogram when renal function improves. Discussed with patient and brother the high likelihood of amputation of RLE on this admission.  Brandon C. Randie Heinzain, MD [facility] Office: [phone] Pager: [phone]

## 2016-07-30 NOTE — ED Provider Notes (Signed)
[facility]-EMERGENCY DEPT Provider Note   CSN: [id number] Arrival date & time: 07/30/16  1437     History   Chief Complaint Chief Complaint  Patient presents with  . Foot Pain    HPI Freddie ApleyJerome D Nelis is a 61 y.o. male.  HPI 61 year old male presenting with right leg pain sent down from the vascular surgery clinic to be admitted due to vascular compromise of the right leg. 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 During one of the surgeries he had an MI during the surgery and developed a thrombus that causes a severe stroke as an patient to have a cognitive impairment and complete blindness. He has been in a rehabilitation facility since then. He was seen in the vascular clinic this morning and they were unable to find pulses with Doppler therefore sent him down here to be admitted. Patient is only oriented 1 and does not know why he is here. He has difficulty telling me where his pain is. He is unable to provide an adequate history  Past Medical History:  Diagnosis Date  . HTN (hypertension)   . Noncompliance   . PAD (peripheral artery disease) (HCC)    a.  s/p left femoral to PT artery bypass with propaten on 02/16/16.    Patient Active Problem List   Diagnosis Date Noted  . AKI (acute kidney injury) (HCC) 07/30/2016  . Limb ischemia 07/30/2016  . HLD (hyperlipidemia) 07/30/2016  . GERD (gastroesophageal reflux disease) 07/30/2016  . Acute encephalopathy 07/30/2016  . Abdominal pain 07/30/2016  . Pressure injury of skin 06/28/2016  . 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 Aphasia   . Hypotension 06/21/2016  . Stroke (HCC) 06/21/2016  . Acute MI, anterolateral wall, initial episode of care (HCC)   . 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 Preoperative cardiovascular examination   . PAD (peripheral artery disease)  (HCC) 02/13/2016  . Leg pain 02/13/2016  . Hyperkalemia 02/13/2016  . Lactic acid increased 02/13/2016  . Essential hypertension 02/13/2016  . Seborrheic dermatitis 08/31/2015  . Actinic keratosis of multiple sites of head and neck 08/31/2015  . Tobacco use disorder 08/31/2015  . Skin ulcer of scalp (HCC) 08/31/2015  . Atherosclerosis of native arteries of extremity with intermittent claudication (HCC) 07/07/2015  . Abnormal EKG 11/13/2013  . Hypertensive urgency 11/13/2013    Past Surgical History:  Procedure Laterality Date  . AMPUTATION Left 06/25/2016   Procedure: AMPUTATION ABOVE KNEE;  Surgeon: Nada LibmanVance W Brabham, MD;  [facility];  Service: Vascular;  Laterality: Left;  . BYPASS GRAFT POPLITEAL TO TIBIAL Left 06/21/2016   Procedure: BYPASS GRAFT POPLITEAL TO TIBIAL USING GORE PROPATEN GRAFT;  Surgeon: Maeola HarmanBrandon Christopher Cain, MD;  [facility];  Service: Vascular;  Laterality: Left;  . EMBOLECTOMY Left 06/21/2016   Procedure: EMBOLECTOMY left lower extremity.;  Surgeon: Maeola HarmanBrandon Christopher Cain, MD;  [facility];  Service: Vascular;  Laterality: Left;  . ENDARTERECTOMY FEMORAL Left 02/16/2016   Procedure: LEFT FEMORAL ENDARTERECTOMY;  Surgeon: Maeola HarmanBrandon Christopher Cain, MD;  [facility];  Service: Vascular;  Laterality: Left;  . FEMORAL BYPASS  02/16/2016   LEFT FEMORAL-POSTERIOR TIBIAL ARTERY BYPASS  WITH PROPATEN 6 MM X 80 CM VASCULAR RING GRAFT (Left)  . FEMORAL-TIBIAL BYPASS GRAFT Left 02/16/2016   Procedure: LEFT FEMORAL-POSTERIOR TIBIAL ARTERY BYPASS  WITH PROPATEN  6 MM X 80 CM VASCULAR RING GRAFT;  Surgeon: Maeola Harman, MD;  Location: [facility] OR;  Service: Vascular;  Laterality: Left;  . PATCH ANGIOPLASTY Left 02/16/2016   Procedure: PATCH ANGIOPLASTY WITH Livia Snellen BIOLOGIC PATCH 1 CM X 6 CM;  Surgeon: Maeola Harman, MD;  Location: [facility] OR;  Service: Vascular;  Laterality: Left;  . PERIPHERAL VASCULAR CATHETERIZATION N/A 07/07/2015   Procedure:  Abdominal Aortogram w/Lower Extremity;  Surgeon: Fransisco Hertz, MD;  Location: [facility] INVASIVE CV LAB;  Service: Cardiovascular;  Laterality: N/A;  . PERIPHERAL VASCULAR CATHETERIZATION N/A 02/15/2016   Procedure: Abdominal Aortogram w/Lower Extremity;  Surgeon: Maeola Harman, MD;  Location: [facility] INVASIVE CV LAB;  Service: Cardiovascular;  Laterality: N/A;  . PERIPHERAL VASCULAR CATHETERIZATION Bilateral 06/20/2016   Procedure: Lower Extremity Angiography;  Surgeon: Maeola Harman, MD;  Location: [facility] INVASIVE CV LAB;  Service: Cardiovascular;  Laterality: Bilateral;  limited runoff on rt leg thrombolysis lt leg bypass graft  . 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       Home Medications    Prior to Admission medications   Medication Sig Start Date End Date Taking? Authorizing Provider  amLODipine (NORVASC) 10 MG tablet Take 1 tablet (10 mg total) by mouth daily. 07/07/16   Samantha J Rhyne, PA-C  aspirin EC 81 MG EC tablet Take 1 tablet (81 mg total) by mouth daily. 07/07/16   Samantha J Rhyne, PA-C  atorvastatin (LIPITOR) 40 MG tablet Take 1 tablet (40 mg total) by mouth daily at 6 PM. 02/18/16   Mir Vergie Living, MD  bisacodyl (DULCOLAX) 5 MG EC tablet Take 1 tablet (5 mg total) by mouth daily as needed for moderate constipation. 02/18/16   Mir Vergie Living, MD  ciprofloxacin (CIPRO) 250 MG tablet Take 250 mg by mouth 2 (two) times daily.    Historical Provider, MD  clopidogrel (PLAVIX) 75 MG tablet Take 1 tablet (75 mg total) by mouth daily. 07/07/16   Samantha J Rhyne, PA-C  docusate sodium (COLACE) 100 MG capsule Take 1 capsule (100 mg total) by mouth daily. 02/18/16   Mir Vergie Living, MD  folic acid (FOLVITE) 1 MG tablet Take 1 tablet (1 mg total) by mouth daily. 07/07/16   Samantha J Rhyne, PA-C  lisinopril (PRINIVIL,ZESTRIL) 20 MG tablet Take 1 tablet (20 mg total) by mouth daily. 07/10/16   Raymond Gurney, PA-C    magnesium hydroxide (MILK OF MAGNESIA) 400 MG/5ML suspension Take 30 mLs by mouth daily as needed for mild constipation.    Historical Provider, MD  metoprolol tartrate (LOPRESSOR) 25 MG tablet Take 5 tablets (125 mg total) by mouth 2 (two) times daily. 07/06/16   Samantha J Rhyne, PA-C  Multiple Vitamin (MULTIVITAMIN WITH MINERALS) TABS tablet Take 1 tablet by mouth daily. Patient not taking: Reported on 07/30/2016 07/07/16   Ames Coupe Rhyne, PA-C  oxyCODONE-acetaminophen (ROXICET) 5-325 MG tablet Take 1 tablet by mouth every 6 (six) hours as needed for moderate pain or severe pain. 07/10/16   Raymond Gurney, PA-C  pantoprazole (PROTONIX) 40 MG tablet Take 1 tablet (40 mg total) by mouth daily. Patient not taking: Reported on 06/19/2016 02/18/16   Mir Vergie Living, MD  selenium sulfide (SELSUN) 1 % LOTN Put on your face in the shower and wash off daily 08/31/15   Selina Cooley, MD  spironolactone (ALDACTONE) 25 MG tablet Take 1 tablet (25 mg total) by mouth daily. Patient not  taking: Reported on 07/30/2016 07/07/16   Lelon Mast J Rhyne, PA-C  thiamine 100 MG tablet Take 1 tablet (100 mg total) by mouth daily. 07/07/16   Dara Lords, PA-C    Family History Family History  Problem Relation Age of Onset  . Cancer Mother     deceased in 53s   . Heart attack Mother     diseased  . Heart disease Father   . Stroke Sister     age 38s  . Heart disease Brother     pacemaker in his 71s    Social History Social History  Substance Use Topics  . Smoking status: Current Every Day Smoker    Packs/day: 0.50    Years: 40.00    Types: Cigarettes  . Smokeless tobacco: Never Used     Comment: cutting back amount he is buying  . Alcohol use 8.4 oz/week    14 Cans of beer per week     Comment: Drinks a 40 oz "tallboy" daily     Allergies   Patient has no known allergies.   Review of Systems Review of Systems  Unable to perform ROS: Mental status change     Physical Exam Updated Vital  Signs BP 129/84 (BP Location: Right Arm)   Pulse 92   Temp 97.5 F (36.4 C) (Oral)   Resp 15   Ht 5\' 8"  (1.727 m)   Wt 44.8 kg   SpO2 100%   BMI 15.01 kg/m   Physical Exam  Constitutional: He appears well-developed and well-nourished.  HENT:  Head: Normocephalic and atraumatic.  Eyes: Conjunctivae are normal.  Neck: Normal range of motion. Neck supple.  Cardiovascular: Normal rate, regular rhythm, S1 normal, S2 normal, normal heart sounds, intact distal pulses and normal pulses.   Pulmonary/Chest: Effort normal and breath sounds normal. No respiratory distress. He has no decreased breath sounds. He has no rhonchi.  Abdominal: Soft. He exhibits no distension. There is no tenderness.  Musculoskeletal: He exhibits no edema.  AKA to LLE with post-op dressing in place. R lower leg and foot are cool to touch when compared to the right and left thigh. Pulse in unable to be found with doppler. R foot is pale with severely delayed cap refill.   Neurological: He is alert. He is disoriented.  Skin: Skin is warm and dry.  Psychiatric: He has a normal mood and affect.  Nursing note and vitals reviewed.    ED Treatments / Results  Labs (all labs ordered are listed, but only abnormal results are displayed) Labs Reviewed  CBC WITH DIFFERENTIAL/PLATELET - Abnormal; Notable for the following:       Result Value   WBC 10.6 (*)    RBC 4.05 (*)    Hemoglobin 12.4 (*)    Platelets 494 (*)    All other components within normal limits  COMPREHENSIVE METABOLIC PANEL - Abnormal; Notable for the following:    Potassium 6.1 (*)    Chloride 100 (*)    Glucose, Bld 114 (*)    BUN 59 (*)    Creatinine, Ser 2.29 (*)    Total Protein 8.9 (*)    GFR calc non Af Amer 29 (*)    GFR calc Af Amer 34 (*)    All other components within normal limits  GLUCOSE, CAPILLARY - Abnormal; Notable for the following:    Glucose-Capillary 240 (*)    All other components within normal limits  MRSA PCR SCREENING    LIPASE, BLOOD  PROTIME-INR  CBC  HEPARIN LEVEL (UNFRACTIONATED)  CREATININE, URINE, RANDOM  UREA NITROGEN, URINE  BASIC METABOLIC PANEL  APTT  I-STAT CG4 LACTIC ACID, ED  I-STAT CG4 LACTIC ACID, ED    EKG  EKG Interpretation None       Radiology No results found.  Procedures Procedures (including critical care time)  Medications Ordered in ED Medications  heparin ADULT infusion 100 units/mL (25000 units/270mL sodium chloride 0.45%) (750 Units/hr Intravenous New Bag/Given 07/30/16 1906)  0.9 %  sodium chloride infusion ( Intravenous New Bag/Given 07/30/16 2323)  magnesium hydroxide (MILK OF MAGNESIA) suspension 30 mL (not administered)  aspirin EC tablet 81 mg (81 mg Oral Not Given 07/30/16 2100)  folic acid (FOLVITE) tablet 1 mg (1 mg Oral Not Given 07/30/16 2318)  metoprolol tartrate (LOPRESSOR) tablet 125 mg (125 mg Oral Not Given 07/30/16 2317)  oxyCODONE-acetaminophen (PERCOCET/ROXICET) 5-325 MG per tablet 1 tablet (not administered)  thiamine (VITAMIN B-1) tablet 100 mg (100 mg Oral Not Given 07/30/16 2100)  atorvastatin (LIPITOR) tablet 40 mg (not administered)  bisacodyl (DULCOLAX) EC tablet 5 mg (not administered)  docusate sodium (COLACE) capsule 100 mg (100 mg Oral Not Given 07/30/16 2100)  pantoprazole (PROTONIX) EC tablet 40 mg (40 mg Oral Not Given 07/30/16 2100)  ondansetron (ZOFRAN) injection 4 mg (not administered)  hydrALAZINE (APRESOLINE) injection 5 mg (not administered)  nicotine (NICODERM CQ - dosed in mg/24 hours) patch 21 mg (21 mg Transdermal Patch Applied 07/30/16 2308)  sodium chloride flush (NS) 0.9 % injection 3 mL (3 mLs Intravenous Not Given 07/30/16 2200)  acetaminophen (TYLENOL) tablet 650 mg (not administered)    Or  acetaminophen (TYLENOL) suppository 650 mg (not administered)  sodium chloride 0.9 % bolus 1,000 mL (0 mLs Intravenous Stopped 07/30/16 2033)  heparin bolus via infusion 2,000 Units (2,000 Units Intravenous Bolus from Bag 07/30/16 1907)  sodium  polystyrene (KAYEXALATE) 15 GM/60ML suspension 45 g (45 g Oral Given 07/30/16 2224)  insulin aspart (novoLOG) injection 5 Units (5 Units Intravenous Given 07/30/16 2339)  dextrose 50 % solution 50 mL (50 mLs Intravenous Given 07/30/16 2310)     Initial Impression / Assessment and Plan / ED Course  I have reviewed the triage vital signs and the nursing notes.  Pertinent labs & imaging results that were available during my care of the patient were reviewed by me and considered in my medical decision making (see chart for details).    61 year old male sent from the vascular clinic due to concern for arterial occlusion in the right lower extremity. On exam he is alert and oriented 1. He is blind. He has severe tenderness palpation in the right lower extremity distal to the knee. The right foot is pale and no pulses found with Doppler. Vascular surgery consulted for concern of ischemic limb. Labs drawn in triage notable for potassium 6.1 and a creatinine of 2.29. EKG without Peaked T waves or widened QRS.  Dr. Randie Heinz with vascular surgery has seen the patient in the emergency department and is recommending admission to medicine and starting heparin. He will follow along and speak more with the family about possibilities of an amputation. Patient discussed with Dr. Clyde Lundborg with the hospitalist service and will be admitted to their care. Care transferred to the hospital service.  Patient care discussed and supervised by my attending, Dr. Denton Lank. Azalia Bilis, MD   Final Clinical Impressions(s) / ED Diagnoses   Final diagnoses:  Peripheral vascular disease [facility])  Atherosclerosis of native artery of right lower  extremity with rest pain (HCC)  AKI (acute kidney injury) [facility])  Hyperkalemia    New Prescriptions Current Discharge Medication List       Izzabelle Bouley [place] Elsie Sakuma, MD 07/31/16 7829    Cathren Laine, MD 07/31/16 1154

## 2016-07-30 NOTE — ED Triage Notes (Addendum)
Pt presents today from vascular clinic for new wound on  Rt foot pt is status post left  Aka in dec, pt is confused and doesn't know why he is here

## 2016-07-30 NOTE — Progress Notes (Signed)
POST OPERATIVE OFFICE NOTE    CC:  F/u for surgery  HPI:  This is a 61 y.o. male who is s/p  1.  Thromboembolectomy of left femoral to PT bypass graft 2.  Harvest of left small saphenous vein 3.  Bypass from existing graft to distal PT artery with 6mm ringed ptfe with distal vein patch 4.  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  During his hospitalization, he did have a STEMI with ST elevations during surgery.  He did not have any further symptoms of ischemia.  His troponin did elevate to >65.  The echo revealed an EF of 60-65% with akinesis of the apical anteroseptal and inferolateral myocardium.  Given his lack of symptoms and preserved LV function we will plan for medical management with outpatient stress testing vs. Cath when stable. Continue aspirin, clopidogrel, metoprolol and Imdur.  His antihypertensive medications were adjusted in the hospital.  Also, upon awakening from anesthesia, he was found to be aphasic and a code stroke was called.   He presents today for follow up.  He complains of new right foot pain that started about 3 days ago.  He states it came on all of a sudden and has remained constant.  He states he has a new wound on the right heel.      No Known Allergies  Current Outpatient Prescriptions  Medication Sig Dispense Refill  . amLODipine (NORVASC) 10 MG tablet Take 1 tablet (10 mg total) by mouth daily.    Marland Kitchen. aspirin EC 81 MG EC tablet Take 1 tablet (81 mg total) by mouth daily.    Marland Kitchen. atorvastatin (LIPITOR) 40 MG tablet Take 1 tablet (40 mg total) by mouth daily at 6 PM. (Patient not taking: Reported on 06/19/2016) 30 tablet 0  . bisacodyl (DULCOLAX) 5 MG EC tablet Take 1 tablet (5 mg total) by mouth daily as needed for moderate constipation. (Patient not taking: Reported on 06/19/2016) 30 tablet 0  . clopidogrel (PLAVIX) 75 MG tablet Take 1 tablet (75 mg total) by mouth daily.    Marland Kitchen. docusate sodium (COLACE) 100 MG  capsule Take 1 capsule (100 mg total) by mouth daily. (Patient not taking: Reported on 06/19/2016) 10 capsule 0  . folic acid (FOLVITE) 1 MG tablet Take 1 tablet (1 mg total) by mouth daily.    Marland Kitchen. lisinopril (PRINIVIL,ZESTRIL) 20 MG tablet Take 1 tablet (20 mg total) by mouth daily. 30 tablet 11  . metoprolol tartrate (LOPRESSOR) 25 MG tablet Take 5 tablets (125 mg total) by mouth 2 (two) times daily.    . Multiple Vitamin (MULTIVITAMIN WITH MINERALS) TABS tablet Take 1 tablet by mouth daily.    Marland Kitchen. oxyCODONE-acetaminophen (ROXICET) 5-325 MG tablet Take 1 tablet by mouth every 6 (six) hours as needed for moderate pain or severe pain. 30 tablet 0  . pantoprazole (PROTONIX) 40 MG tablet Take 1 tablet (40 mg total) by mouth daily. (Patient not taking: Reported on 06/19/2016) 30 tablet 0  . selenium sulfide (SELSUN) 1 % LOTN Put on your face in the shower and wash off daily (Patient not taking: Reported on 06/19/2016) 1 Bottle 3  . spironolactone (ALDACTONE) 25 MG tablet Take 1 tablet (25 mg total) by mouth daily.    Marland Kitchen. thiamine 100 MG tablet Take 1 tablet (100 mg total) by mouth daily.     No current facility-administered medications for this visit.      ROS:  See HPI  Physical Exam:  Vitals:  Vitals:   07/30/16 1317  BP: 96/68  Pulse: 82  Resp: 16  Temp: (!) 95 F (35 C)    Vitals:   07/30/16 1317  Weight: 104 lb (47.2 kg)  Height: 5\' 8"  (1.727 m)     Incision:  Left AKA stump with staples in tact and some eschar present on the incision. Extremities:  Unable to obtain doppler signals right foot; easily palpable bilateral femoral pulses.  Unable to palpate right popliteal pulse.  Right heel with circumferential black ring.  Right foot is with rubor.     Assessment/Plan:  This is a 62 y.o. male who is s/p: 1.  Thromboembolectomy of left femoral to PT bypass graft 2.  Harvest of left small saphenous vein 3.  Bypass from existing graft to distal PT artery with 6mm ringed ptfe with  distal vein patch 4.  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  Unable to obtain any doppler signals in the right foot.  ABI's prior to surgery was 0.4 on the right.  He does have palpable femoral pulses bilaterally. -discussed with Dr. Randie Heinz and will send pt to the ER to be admitted by medicine and possibly started on heparin gtt. -pt is on Plavix/aspirin. -float heel to relieve pressure on the right heel. -staples not removed from left AKA stump-will remove them in the hospital. -will need f/u appointment with Dr. Sanjuana Kava as soon as possible, but since he is going to be admitted to the hospital, Cardiology should be made aware of his admission.   -given the hx of CVA during last admission, may be beneficial for neuro consult as well as he did not have any f/u scheduled with them    Doreatha Massed, PA-C [facility] [phone]  Clinic MD:  Myra Gianotti

## 2016-07-30 NOTE — Progress Notes (Signed)
ANTICOAGULATION CONSULT NOTE - Initial Consult  Pharmacy Consult for heparin Indication: VTE  No Known Allergies  Patient Measurements:   Heparin Dosing Weight: 47.2 kg  Vital Signs: Temp: 97.8 F (36.6 C) (02/05 1524) Temp Source: Oral (02/05 1524) BP: 138/95 (02/05 1730) Pulse Rate: 82 (02/05 1730)  Labs:  Recent Labs  07/30/16 1537  HGB 12.4*  HCT 39.1  PLT 494*  CREATININE 2.29*    Estimated Creatinine Clearance: 22.9 mL/min (by C-G formula based on SCr of 2.29 mg/dL (H)).   Medical History: Past Medical History:  Diagnosis Date  . HTN (hypertension)   . Noncompliance   . PAD (peripheral artery disease) (HCC)    a.  s/p left femoral to PT artery bypass with propaten on 02/16/16.    Assessment: [age] yoM s/p failed revascularization of LLE and subsequent AKA now presents with R foot pain. Pt has chronic ischemia of R leg and per vascular pt to start on heparin gtt with plans for angiogram once renal function improves. No oral anticoagulant PTA noted.  Goal of Therapy:  Heparin level 0.3-0.7 units/ml Monitor platelets by anticoagulation protocol: Yes   Plan:  -Heparin 2000 units x1 -Heparin 750 units/hr -Follow-up 8-hr heparin level -Monitor daily heparin level, CBC, S/Sx bleeding  Fredonia HighlandMichael Keyana Guevara, PharmD PGY-1 Pharmacy Resident Pager: [phone] 07/30/2016

## 2016-07-31 ENCOUNTER — Inpatient Hospital Stay (HOSPITAL_COMMUNITY): Payer: Medicaid Other

## 2016-07-31 DIAGNOSIS — I739 Peripheral vascular disease, unspecified: Secondary | ICD-10-CM

## 2016-07-31 LAB — HEPARIN LEVEL (UNFRACTIONATED)
HEPARIN UNFRACTIONATED: 0.83 [IU]/mL — AB (ref 0.30–0.70)
Heparin Unfractionated: 0.7 IU/mL (ref 0.30–0.70)
Heparin Unfractionated: 0.58 IU/mL (ref 0.30–0.70)

## 2016-07-31 LAB — COMPREHENSIVE METABOLIC PANEL
ALBUMIN: 3.4 g/dL — AB (ref 3.5–5.0)
ALT: 18 U/L (ref 17–63)
AST: 14 U/L — AB (ref 15–41)
Alkaline Phosphatase: 85 U/L (ref 38–126)
Anion gap: 9 (ref 5–15)
BILIRUBIN TOTAL: 0.7 mg/dL (ref 0.3–1.2)
BUN: 26 mg/dL — AB (ref 6–20)
CHLORIDE: 111 mmol/L (ref 101–111)
CO2: 24 mmol/L (ref 22–32)
Calcium: 9.1 mg/dL (ref 8.9–10.3)
Creatinine, Ser: 1.2 mg/dL (ref 0.61–1.24)
GFR calc Af Amer: >60 mL/min (ref >60)
GLUCOSE: 106 mg/dL — AB (ref 65–99)
POTASSIUM: 4.2 mmol/L (ref 3.5–5.1)
Sodium: 144 mmol/L (ref 135–145)
Total Protein: 7.1 g/dL (ref 6.5–8.1)

## 2016-07-31 LAB — BASIC METABOLIC PANEL
Anion gap: 12 (ref 5–15)
BUN: 49 mg/dL — ABNORMAL HIGH (ref 6–20)
CHLORIDE: 114 mmol/L — AB (ref 101–111)
CO2: 20 mmol/L — AB (ref 22–32)
CREATININE: 1.68 mg/dL — AB (ref 0.61–1.24)
Calcium: 9.4 mg/dL (ref 8.9–10.3)
GFR calc Af Amer: 49 mL/min — ABNORMAL LOW (ref >60)
GFR calc non Af Amer: 43 mL/min — ABNORMAL LOW (ref >60)
GLUCOSE: 71 mg/dL (ref 65–99)
Potassium: 5.1 mmol/L (ref 3.5–5.1)
Sodium: 146 mmol/L — ABNORMAL HIGH (ref 135–145)

## 2016-07-31 LAB — CBC
HEMATOCRIT: 33 % — AB (ref 39.0–52.0)
HEMOGLOBIN: 10.5 g/dL — AB (ref 13.0–17.0)
MCH: 30.7 pg (ref 26.0–34.0)
MCHC: 31.8 g/dL (ref 30.0–36.0)
MCV: 96.5 fL (ref 78.0–100.0)
PLATELETS: 441 10*3/uL — AB (ref 150–400)
RBC: 3.42 MIL/uL — AB (ref 4.22–5.81)
RDW: 15.7 % — ABNORMAL HIGH (ref 11.5–15.5)
WBC: 9.7 10*3/uL (ref 4.0–10.5)

## 2016-07-31 LAB — AMMONIA: AMMONIA: 57 umol/L — AB (ref 9–35)

## 2016-07-31 LAB — URINALYSIS, ROUTINE W REFLEX MICROSCOPIC
BILIRUBIN URINE: NEGATIVE
Bacteria, UA: NONE SEEN
GLUCOSE, UA: NEGATIVE mg/dL
KETONES UR: 5 mg/dL — AB
LEUKOCYTES UA: NEGATIVE
NITRITE: NEGATIVE
PH: 5 (ref 5.0–8.0)
Protein, ur: NEGATIVE mg/dL
SPECIFIC GRAVITY, URINE: 1.012 (ref 1.005–1.030)
Squamous Epithelial / LPF: NONE SEEN

## 2016-07-31 LAB — APTT: APTT: 68 s — AB (ref 24–36)

## 2016-07-31 LAB — GLUCOSE, CAPILLARY: Glucose-Capillary: 84 mg/dL (ref 65–99)

## 2016-07-31 LAB — CREATININE, URINE, RANDOM: Creatinine, Urine: 67.42 mg/dL

## 2016-07-31 LAB — MRSA PCR SCREENING: MRSA by PCR: POSITIVE — AB

## 2016-07-31 LAB — PROTIME-INR
INR: 1.13
Prothrombin Time: 14.5 seconds (ref 11.4–15.2)

## 2016-07-31 MED ORDER — HYDROCODONE-ACETAMINOPHEN 5-325 MG PO TABS
1.0000 | ORAL_TABLET | Freq: Four times a day (QID) | ORAL | Status: DC | PRN
  Administered 2016-07-31 – 2016-08-02 (×3): 1 via ORAL
  Filled 2016-07-31 (×3): qty 1

## 2016-07-31 MED ORDER — METHOCARBAMOL 500 MG PO TABS: 500.0000 mg | ORAL_TABLET | Freq: Three times a day (TID) | ORAL | Status: DC | PRN

## 2016-07-31 MED ORDER — CHLORHEXIDINE GLUCONATE CLOTH 2 % EX PADS
6.0000 | MEDICATED_PAD | Freq: Every day | CUTANEOUS | Status: DC
  Administered 2016-07-31 – 2016-08-02 (×3): 6 via TOPICAL

## 2016-07-31 MED ORDER — HEPARIN (PORCINE) IN NACL 100-0.45 UNIT/ML-% IJ SOLN
700.0000 [IU]/h | INTRAMUSCULAR | Status: DC
  Administered 2016-08-01: 650 [IU]/h via INTRAVENOUS
  Filled 2016-07-31: qty 250

## 2016-07-31 MED ORDER — LACTULOSE 10 GM/15ML PO SOLN
20.0000 g | Freq: Two times a day (BID) | ORAL | Status: DC
  Administered 2016-08-01 – 2016-08-03 (×3): 20 g via ORAL
  Filled 2016-07-31 (×4): qty 30

## 2016-07-31 MED ORDER — SENNOSIDES-DOCUSATE SODIUM 8.6-50 MG PO TABS
1.0000 | ORAL_TABLET | Freq: Two times a day (BID) | ORAL | Status: DC
  Administered 2016-07-31 – 2016-08-03 (×4): 1 via ORAL
  Filled 2016-07-31 (×5): qty 1

## 2016-07-31 MED ORDER — POLYETHYLENE GLYCOL 3350 17 G PO PACK
17.0000 g | PACK | Freq: Every day | ORAL | Status: DC
  Administered 2016-08-02 – 2016-08-03 (×2): 17 g via ORAL
  Filled 2016-07-31 (×3): qty 1

## 2016-07-31 MED ORDER — MUPIROCIN 2 % EX OINT
1.0000 "application " | TOPICAL_OINTMENT | Freq: Two times a day (BID) | CUTANEOUS | Status: DC
  Administered 2016-07-31 – 2016-08-03 (×7): 1 via NASAL
  Filled 2016-07-31 (×2): qty 22

## 2016-07-31 MED ORDER — SODIUM CHLORIDE 0.9 % IV SOLN
INTRAVENOUS | Status: DC
  Administered 2016-08-01 – 2016-08-02 (×3): via INTRAVENOUS

## 2016-07-31 NOTE — Progress Notes (Signed)
ANTICOAGULATION CONSULT NOTE - Follow Up Consult  Pharmacy Consult for Heparin Indication: right leg ischemia  No Known Allergies  Patient Measurements: Height: 5\' 8"  (172.7 cm) Weight: 98 lb 12.8 oz (44.8 kg) IBW/kg (Calculated) : 68.4  Vital Signs: Temp: 97.8 F (36.6 C) (02/06 0503) Temp Source: Oral (02/06 0503) BP: 143/85 (02/06 1126) Pulse Rate: 111 (02/06 1126)  Labs:  Recent Labs  07/30/16 1537 07/31/16 0228 07/31/16 1339  HGB 12.4* 10.5*  --   HCT 39.1 33.0*  --   PLT 494* 441*  --   APTT  --  68*  --   LABPROT  --  14.5  --   INR  --  1.13  --   HEPARINUNFRC  --  0.70 0.83*  CREATININE 2.29* 1.68*  --     Estimated Creatinine Clearance: 29.6 mL/min (by C-G formula based on SCr of 1.68 mg/dL (H)).   Medications:  Heparin @ 750 units/hr  Assessment: 60yom continues on heparin for right leg ischemia with plans for angiogram Thursday or Friday once renal function improves. Heparin level is supratherapeutic at 0.83. Noted drop in Hgb 12.4 >10.5, will watch closely. No bleeding per RN.  Goal of Therapy:  Heparin level 0.3-0.7 units/ml Monitor platelets by anticoagulation protocol: Yes   Plan:  1) Decrease heparin to 650 units/hr 2) Check 8 hour heparin level  Fredrik RiggerMarkle, Troyce Febo Sue 07/31/2016,2:33 PM

## 2016-07-31 NOTE — Progress Notes (Addendum)
[facility] Progress Note  Patient: Stephen Bowen ZOX:[id number]   PCP: No PCP Per Patient DOB: 09/30/1955   DOA: 07/30/2016   DOS: 07/31/2016   Date of Service: the patient was seen and examined on 07/31/2016   Subjective: Denies any acute complaint but remains confused. No nausea no vomiting. No acute events overnight.  Brief hospital course: Pt. with PMH of CAD medically managed, PVD, S/P AKA, S/P left leg bypass, HTN, HLD, CVA; admitted on 07/30/2016, with complaint of right foot pain, was found to have critical limb ischemia of the right foot as well as acute encephalopathy. Currently further plan is follow vascular surgery recommendation for leg ischemia and workup for acute encephalopathy.  Assessment and Plan: 1. Limb ischemia Right leg pressure ulcer. prevalon boot has been ordered. Wound care has been consulted. ABI performed. Vascular surgery on board currently on heparin. Angiogram planned Friday or Thursday depending on renal function.  2. Acute kidney injury. Hyperkalemia Etiology, unclear, was on ACE inhibitor as well as on Aldactone. Continue IV hydration. Monitor renal function. Hyperkalemia has improved. He received Kayexalate, insulin and dextrose  3. Acute encephalopathy. Next and etiology currently unclear. CT head unremarkable. No focal deficit. Check B-12, TSH ammonia level. Hold all psychotropic medications. Pain control remains an issue in the setting of confusion will use Norco and Robaxin.  4. CVA, recent STEMI managed medically, peripheral vascular disease. Chronic diastolic CHF, currently euvolemic. Currently on 81 mg aspirin, 40 mg Lipitor and IV heparin for therapeutic dose. Plavix on hold for the need for surgery.  5. Tobacco abuse. Currently wearing a nicotine patch. Monitor.  6. Abdominal pain. Currently resolved. We'll advance the diet. CT abdomen with limitations no focal abnormality. Continue stool softener.  Bowel regimen: last  BM prior to admission Diet: Cardiac diet DVT Prophylaxis: on therapeutic anticoagulation.  Advance goals of care discussion: full code  Family Communication: no family was present at bedside, at the time of interview.  Disposition:  Discharge to SNF. Expected discharge date: To be determined pending hospital course  Consultants: Vascular surgery Procedures: ABI  Antibiotics: Anti-infectives    None        Objective: Physical Exam: Vitals:   07/30/16 2200 07/30/16 2252 07/31/16 0503 07/31/16 1126  BP: 109/77 129/84 134/76 (!) 143/85  Pulse: 87 92 (!) 103 (!) 111  Resp: 13 15 18    Temp:  97.5 F (36.4 C) 97.8 F (36.6 C)   TempSrc:  Oral Oral   SpO2: 100% 100% 100%   Weight:  44.8 kg (98 lb 11.2 oz) 44.8 kg (98 lb 12.8 oz)   Height:  5\' 8"  (1.727 m)      Intake/Output Summary (Last 24 hours) at 07/31/16 1449 Last data filed at 07/30/16 2033  Gross per 24 hour  Intake             1000 ml  Output                0 ml  Net             1000 ml   Filed Weights   07/30/16 2252 07/31/16 0503  Weight: 44.8 kg (98 lb 11.2 oz) 44.8 kg (98 lb 12.8 oz)    General: Alert, Awake and Oriented to self. Appear in mild distress, affect appropriate Eyes: PERRL, Conjunctiva normal ENT: Oral Mucosa clear moist. Neck: no JVD, no Abnormal Mass Or lumps Cardiovascular: S1 and S2 Present, no Murmur, Respiratory: Bilateral Air entry equal and Decreased, no  use of accessory muscle, Clear to Auscultation, no Crackles, no wheezes Abdomen: Bowel Sound present, Soft and no tenderness Skin: no redness, no Rash, no induration Extremities: left AKA, no Pedal edema, no calf tenderness Neurologic: Grossly no focal neuro deficit. Bilaterally Equal motor strength  Data Reviewed: CBC:  Recent Labs Lab 07/30/16 1537 07/31/16 0228  WBC 10.6* 9.7  NEUTROABS 7.6  --   HGB 12.4* 10.5*  HCT 39.1 33.0*  MCV 96.5 96.5  PLT 494* 441*   Basic Metabolic Panel:  Recent Labs Lab 07/30/16 1537  07/31/16 0228  NA 139 146*  K 6.1* 5.1  CL 100* 114*  CO2 25 20*  GLUCOSE 114* 71  BUN 59* 49*  CREATININE 2.29* 1.68*  CALCIUM 10.3 9.4    Liver Function Tests:  Recent Labs Lab 07/30/16 1537  AST 18  ALT 24  ALKPHOS 108  BILITOT 0.5  PROT 8.9*  ALBUMIN 4.3    Recent Labs Lab 07/30/16 2046  LIPASE 30   No results for input(s): AMMONIA in the last 168 hours. Coagulation Profile:  Recent Labs Lab 07/31/16 0228  INR 1.13   Cardiac Enzymes: No results for input(s): CKTOTAL, CKMB, CKMBINDEX, TROPONINI in the last 168 hours. BNP (last 3 results) No results for input(s): PROBNP in the last 8760 hours.  CBG:  Recent Labs Lab 07/30/16 2338 07/31/16 0648  GLUCAP 240* 84    Studies: Ct Abdomen Pelvis Wo Contrast  Result Date: 07/31/2016 CLINICAL DATA:  Abdominal tenderness on physical exam. Altered mental status. EXAM: CT ABDOMEN AND PELVIS WITHOUT CONTRAST TECHNIQUE: Multidetector CT imaging of the abdomen and pelvis was performed following the standard protocol without IV contrast. COMPARISON:  None. FINDINGS: Lower chest: Clear lung bases. Normal heart size without pericardial or pleural effusion. Right coronary artery atherosclerosis. Hepatobiliary: Normal noncontrast appearance of the liver. Normal gallbladder, without biliary ductal dilatation. Pancreas: Degradation secondary patient arm position and susceptibility artifact from wires and leads. Grossly normal noncontrast appearance of the pancreas. Spleen: Normal in size, without focal abnormality. Adrenals/Urinary Tract: Normal adrenal glands. No renal calculi or hydronephrosis. Ureters difficult to follow. No bladder calculi. Stomach/Bowel: Normal stomach, without wall thickening. Normal colon. Normal appendix on image 46. Normal small bowel. Vascular/Lymphatic: Aortic and branch vessel atherosclerosis. No abdominopelvic adenopathy. Reproductive: Normal prostate. Other: No significant free fluid. Tiny fat  containing left inguinal hernia. Musculoskeletal: No acute osseous abnormality. IMPRESSION: 1. Multifactorial degradation, including lack of IV contrast and extensive susceptibility artifact from support apparatus. 2. Given this limitation, no acute process in the abdomen or pelvis. 3.  Coronary artery atherosclerosis. Aortic atherosclerosis. Electronically Signed   By: Jeronimo GreavesKyle  Talbot M.D.   On: 07/31/2016 07:01   Ct Head Wo Contrast  Result Date: 07/31/2016 CLINICAL DATA:  Acute encephalopathy.  No pain. EXAM: CT HEAD WITHOUT CONTRAST TECHNIQUE: Contiguous axial images were obtained from the base of the skull through the vertex without intravenous contrast. COMPARISON:  06/23/2016, 06/21/2016 FINDINGS: Brain: No evidence of acute infarction, hemorrhage, extra-axial collection, ventriculomegaly, or mass effect. Left parietal occipital encephalomalacia from prior insult. Right posterior cerebellar encephalomalacia of from prior insult. Generalized cerebral atrophy. Periventricular white matter low attenuation likely secondary to microangiopathy. Vascular: Cerebrovascular atherosclerotic calcifications are noted. Skull: Negative for fracture or focal lesion. Sinuses/Orbits: Visualized portions of the orbits are unremarkable. Visualized portions of the paranasal sinuses and mastoid air cells are unremarkable. Nasal septum is deviated towards the right. Other: None. IMPRESSION: 1. No acute intracranial pathology. 2. Chronic PCA territory infarcts. Electronically Signed  By: Elige Ko   On: 07/31/2016 12:49     Scheduled Meds: . aspirin EC  81 mg Oral Daily  . atorvastatin  40 mg Oral q1800  . Chlorhexidine Gluconate Cloth  6 each Topical Q0600  . folic acid  1 mg Oral Daily  . metoprolol tartrate  125 mg Oral BID  . mupirocin ointment  1 application Nasal BID  . nicotine  21 mg Transdermal Daily  . pantoprazole  40 mg Oral Daily  . polyethylene glycol  17 g Oral Daily  . senna-docusate  1 tablet Oral  BID  . sodium chloride flush  3 mL Intravenous Q12H  . thiamine  100 mg Oral Daily   Continuous Infusions: . sodium chloride    . heparin     PRN Meds: acetaminophen **OR** acetaminophen, bisacodyl, hydrALAZINE, magnesium hydroxide, ondansetron, oxyCODONE-acetaminophen  Time spent: 30 minutes  Author: Lynden Oxford, MD [facility] Pager: [phone] 07/31/2016 2:49 PM  If 7PM-7AM, please contact night-coverage at [email or web address], password [facility]

## 2016-07-31 NOTE — Care Management Note (Signed)
Case Management Note Donn PieriniKristi Christy Friede RN, BSN Unit 2W-Case Manager [phone]  Patient Details  Name: Stephen ApleyJerome D Bowen MRN: [id number] Date of Birth: Nov 21, 1955  Subjective/Objective:  Pt admitted with ischemic limb                   Action/Plan: [facility]- CSW has been consulted for return to SNF, CM received referral for possible Lovenox needs- CM to follow for d/c plans/needs  Expected Discharge Date:                  Expected Discharge Plan:  Skilled Nursing Facility  In-House Referral:  Clinical Social Work  Discharge planning Services  CM Consult, Medication Assistance  Post Acute Care Choice:    Choice offered to:     DME Arranged:    DME Agency:     HH Arranged:    HH Agency:     Status of Service:  In process, will continue to follow  If discussed at Long Length of Stay Meetings, dates discussed:    Additional Comments:  Darrold SpanWebster, Stephen Spinola Hall, RN 07/31/2016, 10:12 AM

## 2016-07-31 NOTE — Progress Notes (Signed)
ANTICOAGULATION CONSULT NOTE Pharmacy Consult for Heparin Indication: right leg ischemia  No Known Allergies  Patient Measurements: Height: 5\' 8"  (172.7 cm) Weight: 98 lb 12.8 oz (44.8 kg) IBW/kg (Calculated) : 68.4  Vital Signs: Temp: 98 F (36.7 C) (02/06 2106) Temp Source: Oral (02/06 2106) BP: 160/82 (02/06 2106) Pulse Rate: 89 (02/06 2106)  Labs:  Recent Labs  07/30/16 1537 07/31/16 0228 07/31/16 1339 07/31/16 1548 07/31/16 2217  HGB 12.4* 10.5*  --   --   --   HCT 39.1 33.0*  --   --   --   PLT 494* 441*  --   --   --   APTT  --  68*  --   --   --   LABPROT  --  14.5  --   --   --   INR  --  1.13  --   --   --   HEPARINUNFRC  --  0.70 0.83*  --  0.58  CREATININE 2.29* 1.68*  --  1.20  --     Estimated Creatinine Clearance: 41.5 mL/min (by C-G formula based on SCr of 1.2 mg/dL).   Assessment: 61 y.o. male with PVD and RLE ischemia for heparin Goal of Therapy:  Heparin level 0.3-0.7 units/ml Monitor platelets by anticoagulation protocol: Yes   Plan:  Continue Heparin at current rate   Dez Stauffer, Gary FleetGregory Vernon 07/31/2016,11:30 PM

## 2016-07-31 NOTE — Progress Notes (Signed)
VASCULAR LAB PRELIMINARY  ARTERIAL  ABI completed: Right ABI of 0.28 is suggestive of critical arterial occlusive disease at rest. Left dorsalis pedis, and posterior tibial pressures not obtained due to left above the knee amputation. Right posterior tibial pressure not obtained due to absence of waveform.    RIGHT    LEFT    PRESSURE WAVEFORM  PRESSURE WAVEFORM  BRACHIAL 169 Triphasic BRACHIAL 126 Triphasic  DP 47 Monophasic DP AKA     RIGHT LEFT  ABI 0.28 AKA     Elsie StainGregory J Jazell Rosenau, RVT 07/31/2016, 12:05 PM

## 2016-07-31 NOTE — Consult Note (Addendum)
WOC consult requested for right heel.  Vascular team is following for assessment and plan of care.  Pt has decreased ABI; they have ordered a Prevalon boot to offload pressure and plan for an arteriogram later this week, and progress notes indicate a high likelihood of amputation of RLE on this admission. Please refer to their team for further questions. Please re-consult if further assistance is needed.  Thank-you,  Cammie Mcgeeawn Ayen Viviano MSN, RN, CWOCN, [place]-AP, CNS [phone]

## 2016-07-31 NOTE — Progress Notes (Signed)
ANTICOAGULATION CONSULT NOTE - Follow Up Consult  Pharmacy Consult for Heparin  Indication: Right leg ischemia  No Known Allergies  Patient Measurements: Height: 5\' 8"  (172.7 cm) Weight: 98 lb 11.2 oz (44.8 kg) IBW/kg (Calculated) : 68.4  Vital Signs: Temp: 97.5 F (36.4 C) (02/05 2252) Temp Source: Oral (02/05 2252) BP: 129/84 (02/05 2252) Pulse Rate: 92 (02/05 2252)  Labs:  Recent Labs  07/30/16 1537 07/31/16 0228  HGB 12.4* 10.5*  HCT 39.1 33.0*  PLT 494* 441*  APTT  --  68*  LABPROT  --  14.5  INR  --  1.13  HEPARINUNFRC  --  0.70  CREATININE 2.29*  --     Estimated Creatinine Clearance: 21.7 mL/min (by C-G formula based on SCr of 2.29 mg/dL (H)).   Assessment: Heparin for right leg ischemia with plans for angiogram, heparin level within therapeutic range  Goal of Therapy:  Heparin level 0.3-0.7 units/ml Monitor platelets by anticoagulation protocol: Yes   Plan:  -Cont heparin 750 units/hr -1200 HL  Emma-Lee Oddo 07/31/2016,3:14 AM

## 2016-07-31 NOTE — Progress Notes (Signed)
  Progress Note    07/31/2016 7:28 AM * No surgery found *  Subjective:  No acute issues.  Vitals:   07/30/16 2252 07/31/16 0503  BP: 129/84 134/76  Pulse: 92 (!) 103  Resp: 15 18  Temp: 97.5 F (36.4 C) 97.8 F (36.6 C)    Physical Exam: Awake and alert Non labored respirations R foot is warmer, heel discoloration is stable  CBC    Component Value Date/Time   WBC 9.7 07/31/2016 0228   RBC 3.42 (L) 07/31/2016 0228   HGB 10.5 (L) 07/31/2016 0228   HCT 33.0 (L) 07/31/2016 0228   PLT 441 (H) 07/31/2016 0228   MCV 96.5 07/31/2016 0228   MCH 30.7 07/31/2016 0228   MCHC 31.8 07/31/2016 0228   RDW 15.7 (H) 07/31/2016 0228   LYMPHSABS 2.0 07/30/2016 1537   MONOABS 0.7 07/30/2016 1537   EOSABS 0.2 07/30/2016 1537   BASOSABS 0.0 07/30/2016 1537    BMET    Component Value Date/Time   NA 146 (H) 07/31/2016 0228   K 5.1 07/31/2016 0228   CL 114 (H) 07/31/2016 0228   CO2 20 (L) 07/31/2016 0228   GLUCOSE 71 07/31/2016 0228   BUN 49 (H) 07/31/2016 0228   CREATININE 1.68 (H) 07/31/2016 0228   CREATININE 0.92 11/13/2013 1031   CALCIUM 9.4 07/31/2016 0228   GFRNONAA 43 (L) 07/31/2016 0228   GFRNONAA >89 11/13/2013 1031   GFRAA 49 (L) 07/31/2016 0228   GFRAA >89 11/13/2013 1031    INR    Component Value Date/Time   INR 1.13 07/31/2016 0228     Intake/Output Summary (Last 24 hours) at 07/31/16 [id number] Last data filed at 07/30/16 2033  Gross per 24 hour  Intake             1000 ml  Output                0 ml  Net             1000 ml     Assessment:  61 y.o. male is here with right heel ulcer, aki but resolving  Plan: Check abi today Needs to have right foot protected Will plan angiogram likely Thursday or Friday   Brandon C. Randie Heinzain, MD [facility] [place] Office: [phone] Pager: [phone]  07/31/2016 7:28 AM

## 2016-08-01 ENCOUNTER — Encounter: Payer: Self-pay | Admitting: Cardiology

## 2016-08-01 DIAGNOSIS — R636 Underweight: Secondary | ICD-10-CM

## 2016-08-01 DIAGNOSIS — I10 Essential (primary) hypertension: Secondary | ICD-10-CM

## 2016-08-01 DIAGNOSIS — R109 Unspecified abdominal pain: Secondary | ICD-10-CM

## 2016-08-01 DIAGNOSIS — I2510 Atherosclerotic heart disease of native coronary artery without angina pectoris: Secondary | ICD-10-CM

## 2016-08-01 DIAGNOSIS — F17200 Nicotine dependence, unspecified, uncomplicated: Secondary | ICD-10-CM

## 2016-08-01 DIAGNOSIS — I998 Other disorder of circulatory system: Secondary | ICD-10-CM

## 2016-08-01 DIAGNOSIS — E785 Hyperlipidemia, unspecified: Secondary | ICD-10-CM

## 2016-08-01 DIAGNOSIS — G9340 Encephalopathy, unspecified: Secondary | ICD-10-CM

## 2016-08-01 DIAGNOSIS — N179 Acute kidney failure, unspecified: Secondary | ICD-10-CM

## 2016-08-01 DIAGNOSIS — I700 Atherosclerosis of aorta: Secondary | ICD-10-CM | POA: Diagnosis present

## 2016-08-01 DIAGNOSIS — I739 Peripheral vascular disease, unspecified: Secondary | ICD-10-CM

## 2016-08-01 LAB — CBC WITH DIFFERENTIAL/PLATELET
BASOS PCT: 0 %
Basophils Absolute: 0 10*3/uL (ref 0.0–0.1)
EOS ABS: 0.2 10*3/uL (ref 0.0–0.7)
EOS PCT: 3 %
HCT: 29.1 % — ABNORMAL LOW (ref 39.0–52.0)
HEMOGLOBIN: 9.1 g/dL — AB (ref 13.0–17.0)
LYMPHS ABS: 1.8 10*3/uL (ref 0.7–4.0)
Lymphocytes Relative: 26 %
MCH: 30.2 pg (ref 26.0–34.0)
MCHC: 31.3 g/dL (ref 30.0–36.0)
MCV: 96.7 fL (ref 78.0–100.0)
Monocytes Absolute: 0.5 10*3/uL (ref 0.1–1.0)
Monocytes Relative: 7 %
NEUTROS PCT: 64 %
Neutro Abs: 4.3 10*3/uL (ref 1.7–7.7)
PLATELETS: 353 10*3/uL (ref 150–400)
RBC: 3.01 MIL/uL — AB (ref 4.22–5.81)
RDW: 15.8 % — ABNORMAL HIGH (ref 11.5–15.5)
WBC: 6.8 10*3/uL (ref 4.0–10.5)

## 2016-08-01 LAB — COMPREHENSIVE METABOLIC PANEL
ALBUMIN: 3.1 g/dL — AB (ref 3.5–5.0)
ALT: 15 U/L — AB (ref 17–63)
ANION GAP: 9 (ref 5–15)
AST: 12 U/L — ABNORMAL LOW (ref 15–41)
Alkaline Phosphatase: 75 U/L (ref 38–126)
BUN: 17 mg/dL (ref 6–20)
CHLORIDE: 110 mmol/L (ref 101–111)
CO2: 25 mmol/L (ref 22–32)
Calcium: 8.8 mg/dL — ABNORMAL LOW (ref 8.9–10.3)
Creatinine, Ser: 1.08 mg/dL (ref 0.61–1.24)
GFR calc non Af Amer: >60 mL/min (ref >60)
GLUCOSE: 100 mg/dL — AB (ref 65–99)
Potassium: 4.2 mmol/L (ref 3.5–5.1)
SODIUM: 144 mmol/L (ref 135–145)
Total Bilirubin: 0.5 mg/dL (ref 0.3–1.2)
Total Protein: 6.3 g/dL — ABNORMAL LOW (ref 6.5–8.1)

## 2016-08-01 LAB — UREA NITROGEN, URINE: Urea Nitrogen, Ur: 734 mg/dL

## 2016-08-01 LAB — PROTIME-INR
INR: 1.21
PROTHROMBIN TIME: 15.4 s — AB (ref 11.4–15.2)

## 2016-08-01 LAB — TSH: TSH: 1.352 u[IU]/mL (ref 0.350–4.500)

## 2016-08-01 LAB — GLUCOSE, CAPILLARY: Glucose-Capillary: 86 mg/dL (ref 65–99)

## 2016-08-01 LAB — HEPARIN LEVEL (UNFRACTIONATED): Heparin Unfractionated: 0.49 IU/mL (ref 0.30–0.70)

## 2016-08-01 LAB — VITAMIN B12: Vitamin B-12: 383 pg/mL (ref 180–914)

## 2016-08-01 LAB — MAGNESIUM: Magnesium: 2.2 mg/dL (ref 1.7–2.4)

## 2016-08-01 MED ORDER — AMLODIPINE BESYLATE 10 MG PO TABS
10.0000 mg | ORAL_TABLET | Freq: Every day | ORAL | Status: DC
  Administered 2016-08-01 – 2016-08-02 (×2): 10 mg via ORAL
  Filled 2016-08-01 (×3): qty 1

## 2016-08-01 NOTE — Progress Notes (Signed)
  Progress Note    08/01/2016 9:34 AM * No surgery date entered *  Subjective:  No acute issues  Vitals:   07/31/16 2106 08/01/16 0453  BP: (!) 160/82 (!) 170/88  Pulse: 89 80  Resp: 18 18  Temp: 98 F (36.7 C) 97.9 F (36.6 C)    Physical Exam: Awake and alert eating breakfast Bilateral palpable femoral pulses Right foot is warmer today  CBC    Component Value Date/Time   WBC 6.8 08/01/2016 0213   RBC 3.01 (L) 08/01/2016 0213   HGB 9.1 (L) 08/01/2016 0213   HCT 29.1 (L) 08/01/2016 0213   PLT 353 08/01/2016 0213   MCV 96.7 08/01/2016 0213   MCH 30.2 08/01/2016 0213   MCHC 31.3 08/01/2016 0213   RDW 15.8 (H) 08/01/2016 0213   LYMPHSABS 1.8 08/01/2016 0213   MONOABS 0.5 08/01/2016 0213   EOSABS 0.2 08/01/2016 0213   BASOSABS 0.0 08/01/2016 0213    BMET    Component Value Date/Time   NA 144 08/01/2016 0213   K 4.2 08/01/2016 0213   CL 110 08/01/2016 0213   CO2 25 08/01/2016 0213   GLUCOSE 100 (H) 08/01/2016 0213   BUN 17 08/01/2016 0213   CREATININE 1.08 08/01/2016 0213   CREATININE 0.92 11/13/2013 1031   CALCIUM 8.8 (L) 08/01/2016 0213   GFRNONAA >60 08/01/2016 0213   GFRNONAA >89 11/13/2013 1031   GFRAA >60 08/01/2016 0213   GFRAA >89 11/13/2013 1031    INR    Component Value Date/Time   INR 1.21 08/01/2016 0213     Intake/Output Summary (Last 24 hours) at 08/01/16 0934 Last data filed at 08/01/16 [id number]  Gross per 24 hour  Intake             1218 ml  Output              750 ml  Net              468 ml     Assessment:  61 y.o. male is here with right heel ulcer, aki resolved. ABI 0.28 on right.   Plan: Continue heparin until procedure Needs to have right foot protected in boot as it currently is Will plan angiogram tomorrow given resolution of aki  Mandisa Persinger C. Randie Heinzain, MD [facility] [place] Office: [phone] Pager: [phone]  08/01/2016 9:34 AM

## 2016-08-01 NOTE — Progress Notes (Signed)
ANTICOAGULATION CONSULT NOTE - Follow Up Consult  Pharmacy Consult for Heparin Indication: right leg ischemia  No Known Allergies  Patient Measurements: Height: 5\' 8"  (172.7 cm) Weight: 95 lb 8 oz (43.3 kg) IBW/kg (Calculated) : 68.4  Vital Signs: Temp: 97.9 F (36.6 C) (02/07 0453) Temp Source: Oral (02/07 0453) BP: 170/88 (02/07 0453) Pulse Rate: 80 (02/07 0453)  Labs:  Recent Labs  07/30/16 1537  07/31/16 0228 07/31/16 1339 07/31/16 1548 07/31/16 2217 08/01/16 0213  HGB 12.4*  --  10.5*  --   --   --  9.1*  HCT 39.1  --  33.0*  --   --   --  29.1*  PLT 494*  --  441*  --   --   --  353  APTT  --   --  68*  --   --   --   --   LABPROT  --   --  14.5  --   --   --  15.4*  INR  --   --  1.13  --   --   --  1.21  HEPARINUNFRC  --   < > 0.70 0.83*  --  0.58 0.49  CREATININE 2.29*  --  1.68*  --  1.20  --  1.08  < > = values in this interval not displayed.  Estimated Creatinine Clearance: 44.5 mL/min (by C-G formula based on SCr of 1.08 mg/dL).   Medications:  Heparin @ 650 units/hr  Assessment: 60yom continues on heparin for right leg ischemia with plans for angiogram Thursday. Heparin level remains therapeutic at 0.49. Noted drop in Hgb to 9.1, will watch closely. Plt WNL. No bleeding reported.  Goal of Therapy:  Heparin level 0.3-0.7 units/ml Monitor platelets by anticoagulation protocol: Yes   Plan:  Heparin at 650 units/hr Daily heparin level/CBC Monitor for s/sx bleeding Angiogram scheduled for 2/8   Babs BertinHaley Candence Sease, PharmD, BCPS Clinical Pharmacist 08/01/2016 8:37 AM

## 2016-08-01 NOTE — Progress Notes (Signed)
Progress Note    BOW BUNTYN  ZOX:[id number] DOB: 1956-01-02  DOA: 07/30/2016 PCP: No PCP Per Patient    Brief Narrative:   Chief complaint: F/U right leg ischemia  Stephen Bowen is an 61 y.o. 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  Per report, pt was recently admitted for arterial occlusion of the left leg had a bypass and failed therefore had an above-the-knee amputation. 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 He has been in a rehabilitation facility since then.  Vascular surgery following with plans for arteriogram 08/02/16.  Assessment/Plan:   Principal problem:  Limb ischemia of right leg and foot   VVS, Dr. Randie Heinz was consulted by EDP--> recommended therapeutic heparin with angiogram scheduled for 08/02/16. Per Dr. Randie Heinz, pt has high likelihood of amputation of RLE on this admission. Continue to hold Plavix.  Active problems:  Hyperkalemia Potassium 6.1 with mild T-wave peaking in V3 noted on admission. Resolved with treatment including Kayexalate, dextrose/insulin.  HTN Nephrotoxic medications on hold including lisinopril and spironolactone. Norvasc held secondary to soft blood pressure. Blood pressure rising. Resume Norvasc. Continue metoprolol and IV hydralazine as needed.  History of stroke/coronary artery atherosclerosis/aortic atherosclerosis Continue risk factor modification including and aspirin/Lipitor.  Tobacco abuse: The patient has been counseled. Continue nicotine patch.  AKI Patient's baseline creatinine is 0.93. Noted to have a potassium of 2.29 on admission which has steadily come down with holding nephrotoxic medications and  hydration. Current creatinine now close to baseline values.  HLD Last LDL was 37 on 06/22/16. Continue Lipitor.  GERD Continue Protonix.  Abdominal pain CT scan suboptimal due to lack of IV contrast and the fact. No acute process in the abdomen or pelvis noted.  Acute encephalopathy Etiology is not clear. Ammonia level is elevated, which may be contributory. CT of the head shows chronic PCA territory infarcts.  Underweight Body mass index is 14.52 kg/m.   Family Communication/Anticipated D/C date and plan/Code Status   DVT prophylaxis: Therapeutic dose heparin. Code Status: Full Code.  Family Communication: No family at the bedside. Disposition Plan: Unclear at present. Will likely need SNF placement.   Medical Consultants:    Vascular Surgery   Procedures:    None  Anti-Infectives:    None  Subjective:   The patient reports that his leg feels stiff. Denies any shortness of breath. Says his appetite is okay and that his bowels are moving normally. Noted to be a bit confused.  Objective:    Vitals:   07/31/16 0503 07/31/16 1126 07/31/16 2106 08/01/16 0453  BP: 134/76 (!) 143/85 (!) 160/82 (!) 170/88  Pulse: (!) 103 (!) 111 89 80  Resp: 18  18 18   Temp: 97.8 F (36.6 C)  98 F (36.7 C) 97.9 F (36.6 C)  TempSrc: Oral  Oral Oral  SpO2: 100%  100% 100%  Weight: 44.8 kg (98 lb 12.8 oz)   43.3 kg (95 lb 8 oz)  Height:        Intake/Output Summary (Last 24 hours) at 08/01/16 0734 Last data filed at 08/01/16 0641  Gross per 24 hour  Intake  1218 ml  Output              750 ml  Net              468 ml   Filed Weights   07/30/16 2252 07/31/16 0503 08/01/16 0453  Weight: 44.8 kg (98 lb 11.2 oz) 44.8 kg (98 lb 12.8 oz) 43.3 kg (95 lb 8 oz)    Exam: General exam: Appears Slightly restless. Respiratory system: Clear to auscultation. Respiratory effort normal. Cardiovascular system: S1 & S2 heard, RRR. No JVD,  rubs, gallops or clicks.  No murmurs. Gastrointestinal system: Abdomen is nondistended, soft and nontender. No organomegaly or masses felt. Normal bowel sounds heard. Central nervous system: Alert and oriented to self and place. No focal neurological deficits. Extremities: Left BKA with staples as pictured below. Right foot in Prevlon boot. Skin: No rashes, lesions or ulcers. Left BKA staple line as noted. Psychiatry: Judgement and insight appear normal. Mood & affect appropriate.       Data Reviewed:   I have personally reviewed following labs and imaging studies:  Labs: Basic Metabolic Panel:  Recent Labs Lab 07/30/16 1537 07/31/16 0228 07/31/16 1548 08/01/16 0213  NA 139 146* 144 144  K 6.1* 5.1 4.2 4.2  CL 100* 114* 111 110  CO2 25 20* 24 25  GLUCOSE 114* 71 106* 100*  BUN 59* 49* 26* 17  CREATININE 2.29* 1.68* 1.20 1.08  CALCIUM 10.3 9.4 9.1 8.8*  MG  --   --   --  2.2   GFR Estimated Creatinine Clearance: 44.5 mL/min (by C-G formula based on SCr of 1.08 mg/dL). Liver Function Tests:  Recent Labs Lab 07/30/16 1537 07/31/16 1548 08/01/16 0213  AST 18 14* 12*  ALT 24 18 15*  ALKPHOS 108 85 75  BILITOT 0.5 0.7 0.5  PROT 8.9* 7.1 6.3*  ALBUMIN 4.3 3.4* 3.1*    Recent Labs Lab 07/30/16 2046  LIPASE 30    Recent Labs Lab 07/31/16 1548  AMMONIA 57*   Coagulation profile  Recent Labs Lab 07/31/16 0228 08/01/16 0213  INR 1.13 1.21    CBC:  Recent Labs Lab 07/30/16 1537 07/31/16 0228 08/01/16 0213  WBC 10.6* 9.7 6.8  NEUTROABS 7.6  --  4.3  HGB 12.4* 10.5* 9.1*  HCT 39.1 33.0* 29.1*  MCV 96.5 96.5 96.7  PLT 494* 441* 353   Cardiac Enzymes: No results for input(s): CKTOTAL, CKMB, CKMBINDEX, TROPONINI in the last 168 hours. BNP (last 3 results) No results for input(s): PROBNP in the last 8760 hours. CBG:  Recent Labs Lab 07/30/16 2338 07/31/16 0648 08/01/16 0630  GLUCAP 240* 84 86   D-Dimer: No results for input(s): DDIMER in the last 72 hours. Hgb  A1c: No results for input(s): HGBA1C in the last 72 hours. Lipid Profile: No results for input(s): CHOL, HDL, LDLCALC, TRIG, CHOLHDL, LDLDIRECT in the last 72 hours. Thyroid function studies:  Recent Labs  08/01/16 0213  TSH 1.352   Anemia work up:  Recent Labs  08/01/16 0213  VITAMINB12 383   Sepsis Labs:  Recent Labs Lab 07/30/16 1537 07/30/16 1849 07/31/16 0228 08/01/16 0213  WBC 10.6*  --  9.7 6.8  LATICACIDVEN  --  1.65  --   --     Microbiology Recent Results (from the past 240 hour(s))  MRSA PCR Screening     Status: Abnormal   Collection Time: 07/30/16 11:27 PM  Result Value Ref Range Status   MRSA by PCR POSITIVE (  A) NEGATIVE Final    Comment:        The GeneXpert MRSA Assay (FDA approved for NASAL specimens only), is one component of a comprehensive MRSA colonization surveillance program. It is not intended to diagnose MRSA infection nor to guide or monitor treatment for MRSA infections. RESULT CALLED TO, READ BACK BY AND VERIFIED WITH: LB,RN @0153  07/31/16 MKELLY,MLT     Radiology: Ct Abdomen Pelvis Wo Contrast  Result Date: 07/31/2016 CLINICAL DATA:  Abdominal tenderness on physical exam. Altered mental status. EXAM: CT ABDOMEN AND PELVIS WITHOUT CONTRAST TECHNIQUE: Multidetector CT imaging of the abdomen and pelvis was performed following the standard protocol without IV contrast. COMPARISON:  None. FINDINGS: Lower chest: Clear lung bases. Normal heart size without pericardial or pleural effusion. Right coronary artery atherosclerosis. Hepatobiliary: Normal noncontrast appearance of the liver. Normal gallbladder, without biliary ductal dilatation. Pancreas: Degradation secondary patient arm position and susceptibility artifact from wires and leads. Grossly normal noncontrast appearance of the pancreas. Spleen: Normal in size, without focal abnormality. Adrenals/Urinary Tract: Normal adrenal glands. No renal calculi or hydronephrosis. Ureters difficult  to follow. No bladder calculi. Stomach/Bowel: Normal stomach, without wall thickening. Normal colon. Normal appendix on image 46. Normal small bowel. Vascular/Lymphatic: Aortic and branch vessel atherosclerosis. No abdominopelvic adenopathy. Reproductive: Normal prostate. Other: No significant free fluid. Tiny fat containing left inguinal hernia. Musculoskeletal: No acute osseous abnormality. IMPRESSION: 1. Multifactorial degradation, including lack of IV contrast and extensive susceptibility artifact from support apparatus. 2. Given this limitation, no acute process in the abdomen or pelvis. 3.  Coronary artery atherosclerosis. Aortic atherosclerosis. Electronically Signed   By: Jeronimo GreavesKyle  Talbot M.D.   On: 07/31/2016 07:01   Ct Head Wo Contrast  Result Date: 07/31/2016 CLINICAL DATA:  Acute encephalopathy.  No pain. EXAM: CT HEAD WITHOUT CONTRAST TECHNIQUE: Contiguous axial images were obtained from the base of the skull through the vertex without intravenous contrast. COMPARISON:  06/23/2016, 06/21/2016 FINDINGS: Brain: No evidence of acute infarction, hemorrhage, extra-axial collection, ventriculomegaly, or mass effect. Left parietal occipital encephalomalacia from prior insult. Right posterior cerebellar encephalomalacia of from prior insult. Generalized cerebral atrophy. Periventricular white matter low attenuation likely secondary to microangiopathy. Vascular: Cerebrovascular atherosclerotic calcifications are noted. Skull: Negative for fracture or focal lesion. Sinuses/Orbits: Visualized portions of the orbits are unremarkable. Visualized portions of the paranasal sinuses and mastoid air cells are unremarkable. Nasal septum is deviated towards the right. Other: None. IMPRESSION: 1. No acute intracranial pathology. 2. Chronic PCA territory infarcts. Electronically Signed   By: Elige KoHetal  Patel   On: 07/31/2016 12:49    Medications:   . aspirin EC  81 mg Oral Daily  . atorvastatin  40 mg Oral q1800  .  Chlorhexidine Gluconate Cloth  6 each Topical Q0600  . folic acid  1 mg Oral Daily  . lactulose  20 g Oral BID  . metoprolol tartrate  125 mg Oral BID  . mupirocin ointment  1 application Nasal BID  . nicotine  21 mg Transdermal Daily  . pantoprazole  40 mg Oral Daily  . polyethylene glycol  17 g Oral Daily  . senna-docusate  1 tablet Oral BID  . sodium chloride flush  3 mL Intravenous Q12H  . thiamine  100 mg Oral Daily   Continuous Infusions: . sodium chloride 75 mL/hr at 08/01/16 0154  . heparin 650 Units/hr (08/01/16 0153)    Medical decision making is of high complexity and this patient is at high risk of deterioration, therefore this is a level  3 visit.  (> 4 problem points, >4 data points, high risk)    LOS: 2 days   Terrell Shimko  [facility] Pager [phone]. If unable to reach me by pager, please call my cell phone at [phone].  *Please refer to [email or web address], password [email or web address] to get updated schedule on who will round on this patient, as hospitalists switch teams weekly. If 7PM-7AM, please contact night-coverage at [email or web address], password [email or web address] for any overnight needs.  08/01/2016, 7:34 AM

## 2016-08-01 NOTE — Progress Notes (Signed)
Clinical Social Worker contacted Admission Nurse, [facility] stated that the patient would be able to come back to the facility after discharge with a new 30 day LOG. CSW will contact Director Hope Rife to start the LOG process for patient.   Marrianne MoodAshley Tynesha Free, MSW,  [employer]WA [phone]

## 2016-08-02 ENCOUNTER — Encounter (HOSPITAL_COMMUNITY)
Admission: EM | Disposition: A | Discharge status: Home Health | Payer: Self-pay | Source: Home / Self Care | Attending: Internal Medicine

## 2016-08-02 ENCOUNTER — Encounter (HOSPITAL_COMMUNITY): Payer: Self-pay | Admitting: Vascular Surgery

## 2016-08-02 DIAGNOSIS — Z22322 Carrier or suspected carrier of Methicillin resistant Staphylococcus aureus: Secondary | ICD-10-CM

## 2016-08-02 DIAGNOSIS — I998 Other disorder of circulatory system: Secondary | ICD-10-CM

## 2016-08-02 DIAGNOSIS — I63433 Cerebral infarction due to embolism of bilateral posterior cerebral arteries: Secondary | ICD-10-CM

## 2016-08-02 HISTORY — PX: PERIPHERAL VASCULAR BALLOON ANGIOPLASTY: CATH118281

## 2016-08-02 HISTORY — PX: PERIPHERAL VASCULAR INTERVENTION: CATH118257

## 2016-08-02 HISTORY — PX: ABDOMINAL AORTOGRAM W/LOWER EXTREMITY: CATH118223

## 2016-08-02 LAB — BASIC METABOLIC PANEL
Anion gap: 9 (ref 5–15)
BUN: 6 mg/dL (ref 6–20)
CHLORIDE: 107 mmol/L (ref 101–111)
CO2: 23 mmol/L (ref 22–32)
Calcium: 8.4 mg/dL — ABNORMAL LOW (ref 8.9–10.3)
Creatinine, Ser: 0.9 mg/dL (ref 0.61–1.24)
GFR calc Af Amer: >60 mL/min (ref >60)
GFR calc non Af Amer: >60 mL/min (ref >60)
GLUCOSE: 101 mg/dL — AB (ref 65–99)
POTASSIUM: 3.4 mmol/L — AB (ref 3.5–5.1)
Sodium: 139 mmol/L (ref 135–145)

## 2016-08-02 LAB — CBC
HEMATOCRIT: 27.8 % — AB (ref 39.0–52.0)
HEMOGLOBIN: 8.6 g/dL — AB (ref 13.0–17.0)
MCH: 29.8 pg (ref 26.0–34.0)
MCHC: 30.9 g/dL (ref 30.0–36.0)
MCV: 96.2 fL (ref 78.0–100.0)
Platelets: 340 10*3/uL (ref 150–400)
RBC: 2.89 MIL/uL — ABNORMAL LOW (ref 4.22–5.81)
RDW: 15.5 % (ref 11.5–15.5)
WBC: 7.1 10*3/uL (ref 4.0–10.5)

## 2016-08-02 LAB — POCT ACTIVATED CLOTTING TIME
ACTIVATED CLOTTING TIME: 230 s
ACTIVATED CLOTTING TIME: 235 s
Activated Clotting Time: 208 seconds
Activated Clotting Time: 191 seconds
Activated Clotting Time: 186 seconds

## 2016-08-02 LAB — HEPARIN LEVEL (UNFRACTIONATED): Heparin Unfractionated: 0.28 IU/mL — ABNORMAL LOW (ref 0.30–0.70)

## 2016-08-02 SURGERY — ABDOMINAL AORTOGRAM W/LOWER EXTREMITY
Anesthesia: LOCAL | Laterality: Right

## 2016-08-02 MED ORDER — OXYCODONE-ACETAMINOPHEN 5-325 MG PO TABS
1.0000 | ORAL_TABLET | ORAL | Status: DC | PRN
  Administered 2016-08-02: 1 via ORAL

## 2016-08-02 MED ORDER — CLOPIDOGREL BISULFATE 75 MG PO TABS
ORAL_TABLET | ORAL | Status: AC
  Filled 2016-08-02: qty 1

## 2016-08-02 MED ORDER — ENOXAPARIN SODIUM 30 MG/0.3ML ~~LOC~~ SOLN
30.0000 mg | SUBCUTANEOUS | Status: DC
  Administered 2016-08-02: 30 mg via SUBCUTANEOUS
  Filled 2016-08-02: qty 0.3

## 2016-08-02 MED ORDER — OXYCODONE-ACETAMINOPHEN 5-325 MG PO TABS
ORAL_TABLET | ORAL | Status: AC
  Filled 2016-08-02: qty 1

## 2016-08-02 MED ORDER — HYDRALAZINE HCL 20 MG/ML IJ SOLN
INTRAMUSCULAR | Status: DC | PRN
  Administered 2016-08-02: 10 mg via INTRAVENOUS

## 2016-08-02 MED ORDER — POTASSIUM CHLORIDE IN NACL 20-0.9 MEQ/L-% IV SOLN
INTRAVENOUS | Status: DC
  Administered 2016-08-02: 1000 mL via INTRAVENOUS
  Filled 2016-08-02: qty 1000

## 2016-08-02 MED ORDER — HEPARIN SODIUM (PORCINE) 1000 UNIT/ML IJ SOLN
INTRAMUSCULAR | Status: AC
  Filled 2016-08-02: qty 1

## 2016-08-02 MED ORDER — SODIUM CHLORIDE 0.9 % IV SOLN: 1.0000 mL/kg/h | INTRAVENOUS | Status: DC

## 2016-08-02 MED ORDER — HYDRALAZINE HCL 20 MG/ML IJ SOLN
INTRAMUSCULAR | Status: AC
  Filled 2016-08-02: qty 1

## 2016-08-02 MED ORDER — HEPARIN SODIUM (PORCINE) 1000 UNIT/ML IJ SOLN
INTRAMUSCULAR | Status: DC | PRN
  Administered 2016-08-02: 4000 [IU] via INTRAVENOUS
  Administered 2016-08-02 (×2): 2000 [IU] via INTRAVENOUS

## 2016-08-02 MED ORDER — HEPARIN (PORCINE) IN NACL 2-0.9 UNIT/ML-% IJ SOLN
INTRAMUSCULAR | Status: DC | PRN
  Administered 2016-08-02: 1000 mL via INTRA_ARTERIAL

## 2016-08-02 MED ORDER — FENTANYL CITRATE (PF) 100 MCG/2ML IJ SOLN
INTRAMUSCULAR | Status: AC
  Filled 2016-08-02: qty 2

## 2016-08-02 MED ORDER — CLOPIDOGREL BISULFATE 75 MG PO TABS
75.0000 mg | ORAL_TABLET | Freq: Every day | ORAL | Status: DC
  Administered 2016-08-02 – 2016-08-03 (×2): 75 mg via ORAL
  Filled 2016-08-02: qty 1

## 2016-08-02 MED ORDER — IODIXANOL 320 MG/ML IV SOLN
INTRAVENOUS | Status: DC | PRN
  Administered 2016-08-02: 115 mL via INTRA_ARTERIAL

## 2016-08-02 MED ORDER — LIDOCAINE HCL (PF) 1 % IJ SOLN
INTRAMUSCULAR | Status: DC | PRN
  Administered 2016-08-02: 20 mL via SUBCUTANEOUS

## 2016-08-02 MED ORDER — FENTANYL CITRATE (PF) 100 MCG/2ML IJ SOLN
INTRAMUSCULAR | Status: DC | PRN
  Administered 2016-08-02: 25 ug via INTRAVENOUS

## 2016-08-02 MED ORDER — LIDOCAINE HCL (PF) 1 % IJ SOLN
INTRAMUSCULAR | Status: AC
  Filled 2016-08-02: qty 30

## 2016-08-02 MED ORDER — HEPARIN (PORCINE) IN NACL 2-0.9 UNIT/ML-% IJ SOLN
INTRAMUSCULAR | Status: AC
  Filled 2016-08-02: qty 1000

## 2016-08-02 SURGICAL SUPPLY — 32 items
BALLN STERLING OTW 3X150X150 (BALLOONS) ×3
BALLN STERLING OTW 5X220X150 (BALLOONS) ×3
BALLN STERLING OTW 6X40X135 (BALLOONS) ×3
BALLOON STERLING OTW 3X150X150 (BALLOONS) ×2 IMPLANT
BALLOON STERLING OTW 5X220X150 (BALLOONS) ×2 IMPLANT
BALLOON STERLING OTW 6X40X135 (BALLOONS) ×2 IMPLANT
CATH MUSTANG 3X200X135 (BALLOONS) ×3 IMPLANT
CATH OMNI FLUSH 5F 65CM (CATHETERS) ×3 IMPLANT
CATH QUICKCROSS .035X135CM (MICROCATHETER) ×3 IMPLANT
CATH QUICKCROSS SUPP .018X90CM (MICROCATHETER) ×3 IMPLANT
CATH QUICKCROSS SUPP .035X90CM (MICROCATHETER) ×3 IMPLANT
COVER PRB 48X5XTLSCP FOLD TPE (BAG) ×4 IMPLANT
COVER PROBE 5X48 (BAG) ×2
DEVICE ONE SNARE 15MM (VASCULAR PRODUCTS) ×3 IMPLANT
GUIDEWIRE ANGLED .035X260CM (WIRE) ×3 IMPLANT
HEMOSTASIS PAD V PLUS (HEMOSTASIS) ×3 IMPLANT
KIT ENCORE 26 ADVANTAGE (KITS) ×3 IMPLANT
KIT PV (KITS) ×3 IMPLANT
SHEATH FLEX ANSEL ST 6FR 45CM (SHEATH) ×3 IMPLANT
SHEATH MICROPUNCTURE PEDAL 4FR (SHEATH) ×3 IMPLANT
SHEATH PINNACLE 5F 10CM (SHEATH) ×6 IMPLANT
STENT INNOVA 6X40X130 (Permanent Stent) ×3 IMPLANT
STENT VIABAHN 5X250X120 (Permanent Stent) ×3 IMPLANT
STENT VIABAHN 5X50X120 (Permanent Stent) ×3 IMPLANT
STOPCOCK MORSE 400PSI 3WAY (MISCELLANEOUS) ×15 IMPLANT
SYR MEDRAD MARK V 150ML (SYRINGE) ×3 IMPLANT
TRANSDUCER W/STOPCOCK (MISCELLANEOUS) ×3 IMPLANT
TRAY PV CATH (CUSTOM PROCEDURE TRAY) ×3 IMPLANT
WIRE BENTSON .035X145CM (WIRE) ×3 IMPLANT
WIRE G V18X300CM (WIRE) ×6 IMPLANT
WIRE MINI STICK MAX (SHEATH) ×3 IMPLANT
WIRE ROSEN-J .035X260CM (WIRE) ×3 IMPLANT

## 2016-08-02 NOTE — Progress Notes (Signed)
Right anterior tibial pulse dopplered. Left AKA, staples intact, incision well approximated

## 2016-08-02 NOTE — Progress Notes (Signed)
ANTICOAGULATION CONSULT NOTE - Follow Up Consult  Pharmacy Consult for Heparin Indication: right leg ischemia  No Known Allergies  Patient Measurements: Height: 5\' 8"  (172.7 cm) Weight: 95 lb 8 oz (43.3 kg) IBW/kg (Calculated) : 68.4  Vital Signs: Temp: 98 F (36.7 C) (02/07 2151) Temp Source: Oral (02/07 2151) BP: 132/74 (02/07 2151) Pulse Rate: 84 (02/07 2151)  Labs:  Recent Labs  07/31/16 0228  07/31/16 1548 07/31/16 2217 08/01/16 0213 08/02/16 0239  HGB 10.5*  --   --   --  9.1* 8.6*  HCT 33.0*  --   --   --  29.1* 27.8*  PLT 441*  --   --   --  353 340  APTT 68*  --   --   --   --   --   LABPROT 14.5  --   --   --  15.4*  --   INR 1.13  --   --   --  1.21  --   HEPARINUNFRC 0.70  < >  --  0.58 0.49 0.28*  CREATININE 1.68*  --  1.20  --  1.08 0.90  < > = values in this interval not displayed.  Estimated Creatinine Clearance: 53.5 mL/min (by C-G formula based on SCr of 0.9 mg/dL).   Medications:  Heparin @ 650 units/hr  Assessment: Stephen Bowen continues on heparin for right leg ischemia with plans for angiogram Thursday. Heparin level down to slightly subtherapeutic (0.28) on gtt at 650 units/hr. No issues with line or bleeding reported per RN.  Goal of Therapy:  Heparin level 0.3-0.7 units/ml Monitor platelets by anticoagulation protocol: Yes   Plan:  Increase heparin to 700 units/hr F/u 6 hr heparin level Angiogram scheduled for 2/8  Christoper Fabianaron Salah Burlison, PharmD, BCPS Clinical pharmacist, pager [phone] 08/02/2016 4:44 AM

## 2016-08-02 NOTE — Progress Notes (Signed)
  Progress Note    08/02/2016 7:05 AM * No surgery date entered *  Subjective:  No acute issues overnight  Vitals:   08/01/16 2151 08/02/16 0522  BP: 132/74 136/79  Pulse: 84 80  Resp: 18 18  Temp: 98 F (36.7 C) 97.9 F (36.6 C)    Physical Exam: Awake and alert Non labored respirations Abdomen is soft Palpable femoral pulses Monophasic AT signal  CBC    Component Value Date/Time   WBC 7.1 08/02/2016 0239   RBC 2.89 (L) 08/02/2016 0239   HGB 8.6 (L) 08/02/2016 0239   HCT 27.8 (L) 08/02/2016 0239   PLT 340 08/02/2016 0239   MCV 96.2 08/02/2016 0239   MCH 29.8 08/02/2016 0239   MCHC 30.9 08/02/2016 0239   RDW 15.5 08/02/2016 0239   LYMPHSABS 1.8 08/01/2016 0213   MONOABS 0.5 08/01/2016 0213   EOSABS 0.2 08/01/2016 0213   BASOSABS 0.0 08/01/2016 0213    BMET    Component Value Date/Time   NA 139 08/02/2016 0239   K 3.4 (L) 08/02/2016 0239   CL 107 08/02/2016 0239   CO2 23 08/02/2016 0239   GLUCOSE 101 (H) 08/02/2016 0239   BUN 6 08/02/2016 0239   CREATININE 0.90 08/02/2016 0239   CREATININE 0.92 11/13/2013 1031   CALCIUM 8.4 (L) 08/02/2016 0239   GFRNONAA >60 08/02/2016 0239   GFRNONAA >89 11/13/2013 1031   GFRAA >60 08/02/2016 0239   GFRAA >89 11/13/2013 1031    INR    Component Value Date/Time   INR 1.21 08/01/2016 0213     Intake/Output Summary (Last 24 hours) at 08/02/16 0705 Last data filed at 08/02/16 [id number]  Gross per 24 hour  Intake              603 ml  Output             1010 ml  Net             -407 ml     Assessment/Plan  61 y.o. male is here with R foot pain and heel ulceration s/p left AKA. Angiogram today with possible intervention of RLE with possible pedal access.   Zen Felling C. Randie Heinzain, MD [facility] Office: [phone] Pager: [phone]  08/02/2016 7:05 AM

## 2016-08-02 NOTE — Progress Notes (Addendum)
Site area: left groin Site Prior to Removal:  Level 0 Pressure Applied For:  30 minutes Manual:   yes Patient Status During Pull:  stable Post Pull Site:  Level 0 Post Pull Instructions Given:  yes Post Pull Pulses Present: lt AKA; rt AT dopplered Dressing Applied:  yes Bedrest begins @ 1325 Comments:

## 2016-08-02 NOTE — Op Note (Addendum)
Patient name: Stephen Bowen MRN: [id number] DOB: 08-20-1955 Sex: male  08/02/2016 Pre-operative Diagnosis: critical right lower extremity ischemia Post-operative diagnosis:  Same Surgeon:  Apolinar JunesBrandon C. Randie Heinzain, MD Procedure Performed: 1.  US guided cannulation left common femoral artery 2.  US guided cannulation of right anterior tibial artery 3.  Aortogram with right lower extremity runoff 4.  Stent of right proximal sfa with 6 x 40 innova 5.  Stent of right sfa and popliteal artery with viabahn 5 x 250mm and extended with 5 x 50mm 6.  Balloon angioplasty of right peroneal artery with 3mm balloon  Indications:  61 year old male history of a left above-the-knee amputation now has critical right lower extremity ischemia and is indicated for the above procedure.  Findings: There is a greater than 50% stenosis of the left common iliac artery that was not treated. There is a 60% stenosis of the right proximal SFA that was treated with stenting with 0% residual stenosis. The rest of the SFA was not diseased until it occludes above the adductor canal. Reconstitution is via multiple collaterals to the PT and distal anterior tibial artery. Following the above procedure we had runoff via the anterior tibial artery with 0% stenosis as well as the peroneal artery to the level of the ankle was 0% stenosis. The posterior tibial artery is reconstituted in the mid calf.   Procedure:  The patient was identified in the holding area and taken to room 8.  The patient was then placed supine on the table and prepped and draped in the usual sterile fashion.  A time out was called.  Ultrasound was used to evaluate the left common femoral artery.  It was patent .  A digital ultrasound image was acquired.  A micropuncture needle was used to access the left common femoral artery under ultrasound guidance.  An 018 wire was advanced without resistance and a micropuncture sheath was placed.  The 018 wire was removed and a  benson wire was placed.  The micropuncture sheath was exchanged for a 5 french sheath.  An omniflush catheter was advanced over the wire to the level of L-1.  An abdominal angiogram was obtained.  Next, using the omniflush catheter and a benson wire, the aortic bifurcation was crossed and the catheter was placed into theright external iliac artery and right runoff was obtained. We then turned our attention to the right anterior tibial artery identified as well as ultrasound cannulated with micropuncture needle followed by wire and sheath. A Glidewire was then placed followed by 035 catheter. This time the patient was heparinized we then exchanged and the from the left common femoral artery to a long 6 French sheath into the right SFA. Using Glidewire and quick cross catheter from above we were able to get a subintimal plane across the occlusion to the level of the popliteal. From below we used an 018 catheter and V 18 wire to get to the level of the popliteal. Unfortunately we had trouble passing a snare. Ultimately we're able to get from the above catheter and wire into the peroneal artery and confirmed intraluminal access. Satisfied with this we then performed balloon dilation with a 3 mm balloon and then deployed a long viabahn stent from the level of the knee crossing the SFA occlusion. We then able to track a little shorter viabahn stent into the below-knee popliteal artery and deployed this. These were postdilated with 3 mm balloon distally 5 mm balloon proximally. We then performed balloon angioplasty  of the peroneal artery with a 3 mm balloon. Completion runoff demonstrating above findings. Satisfied we turned our attention first the right groin took an angled shot of the proximal SFA and then deployed a 6 x 40 and over stent and postdilated with 6mm balloon. Completion demonstrated 0% residual stenosis. Satisfied the sheath was retracted into the left external iliac artery and the catheters and wires were  removed. Patient tolerated the procedure well without immediate competition.  Contrast: 115cc  Radiation: 23 minutes   Jaymin Waln C. Randie Heinz, MD [facility] Office: [phone] Pager: [phone]

## 2016-08-02 NOTE — Progress Notes (Signed)
Progress Note    Stephen Bowen  ZOX:[id number] DOB: [birth year]  DOA: 07/30/2016 PCP: No PCP Per Patient    Brief Narrative:   Chief complaint: F/U right leg ischemia  Stephen Bowen is an 61 y.o. 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  Per report, pt was recently admitted for arterial occlusion of the left leg had a bypass and failed therefore had an above-the-knee amputation. 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 He has been in a rehabilitation facility since then.  Vascular surgery following with plans for arteriogram 08/02/16.  Assessment/Plan:   Principal problem:  Limb ischemia of right leg and foot   VVS, Dr. Randie Heinz was consulted by EDP--> recommended therapeutic heparin with angiogram scheduled which was done 08/02/16. Status post stent of right proximal SFA and popliteal arteries as well as balloon angioplasty of the right peroneal artery. Per Dr. Randie Heinz, pt has high likelihood of amputation of RLE on this admission. Plavix resumed. Heparin discontinued. Await final recommendations.  Active problems:  Hyperkalemia/hypokalemia Potassium 6.1 with mild T-wave peaking in V3 noted on admission. Resolved with treatment including Kayexalate, dextrose/insulin. Now mild hypokalemia, so we'll add potassium to his IV fluids.  HTN Nephrotoxic medications on hold including lisinopril and spironolactone. Norvasc held secondary to soft blood pressure. Norvasc resumed 08/01/16. Continue metoprolol and IV hydralazine as needed.  History of stroke/coronary artery atherosclerosis/aortic atherosclerosis Continue risk factor modification including and  aspirin/Lipitor.  Tobacco abuse: The patient has been counseled. Continue nicotine patch.  AKI Patient's baseline creatinine is 0.93. Noted to have a creatinine of 2.29 on admission which has steadily come down with holding nephrotoxic medications and hydration. Current creatinine now back to baseline values.  HLD Last LDL was 37 on 06/22/16. Continue Lipitor.  GERD Continue Protonix.  Abdominal pain CT scan suboptimal due to lack of IV contrast and the fact. No acute process in the abdomen or pelvis noted.  Acute encephalopathy Etiology is not clear. Ammonia level is elevated, which may be contributory. CT of the head shows chronic PCA territory infarcts.  Underweight Body mass index is 14.52 kg/m.    MRSA carrier Decontamination therapy provided.   Family Communication/Anticipated D/C date and plan/Code Status   DVT prophylaxis: Lovenox. Code Status: Full Code.  Family Communication: No family at the bedside. Disposition Plan: Unclear at present. Will likely need SNF placement.   Medical Consultants:    Vascular Surgery   Procedures:    None  Anti-Infectives:    None  Subjective:   The patient is without any significant complaints post arteriogram. Denies shortness of breath. No current complaints of pain.  Objective:    Vitals:   08/01/16 0941 08/01/16 1500 08/01/16 2151 08/02/16 0522  BP: (!) 157/80 (!) 177/87 132/74 136/79  Pulse: (!) 109 71 84 80  Resp:  20 18 18   Temp:  97.8 F (36.6 C) 98 F (36.7 C) 97.9 F (36.6 C)  TempSrc:  Oral Oral Oral  SpO2:  98% 100% 100%  Weight:      Height:        Intake/Output Summary (Last 24 hours) at 08/02/16 0731 Last data filed at 08/02/16 8119  Gross per  24 hour  Intake              603 ml  Output             1010 ml  Net             -407 ml   Filed Weights   07/30/16 2252 07/31/16 0503 08/01/16 0453  Weight: 44.8 kg (98 lb 11.2 oz) 44.8 kg (98 lb 12.8 oz) 43.3 kg (95 lb 8 oz)     Exam: General exam: Calm, no acute distress. Respiratory system: Clear to auscultation. Respiratory effort normal. Cardiovascular system: S1 & S2 heard, RRR. No JVD,  rubs, gallops or clicks. No murmurs. Gastrointestinal system: Abdomen is nondistended, soft and nontender. No organomegaly or masses felt. Normal bowel sounds heard. Central nervous system: Alert and oriented to self and place. No focal neurological deficits. Extremities: Left BKA with staples as pictured below. Right foot in Prevlon boot. Skin: No rashes, lesions or ulcers. Left BKA staple line as noted, unchanged. Psychiatry: Judgement and insight appear normal. Mood & affect appropriate.       Data Reviewed:   I have personally reviewed following labs and imaging studies:  Labs: Basic Metabolic Panel:  Recent Labs Lab 07/30/16 1537 07/31/16 0228 07/31/16 1548 08/01/16 0213 08/02/16 0239  NA 139 146* 144 144 139  K 6.1* 5.1 4.2 4.2 3.4*  CL 100* 114* 111 110 107  CO2 25 20* 24 25 23   GLUCOSE 114* 71 106* 100* 101*  BUN 59* 49* 26* 17 6  CREATININE 2.29* 1.68* 1.20 1.08 0.90  CALCIUM 10.3 9.4 9.1 8.8* 8.4*  MG  --   --   --  2.2  --    GFR Estimated Creatinine Clearance: 53.5 mL/min (by C-G formula based on SCr of 0.9 mg/dL). Liver Function Tests:  Recent Labs Lab 07/30/16 1537 07/31/16 1548 08/01/16 0213  AST 18 14* 12*  ALT 24 18 15*  ALKPHOS 108 85 75  BILITOT 0.5 0.7 0.5  PROT 8.9* 7.1 6.3*  ALBUMIN 4.3 3.4* 3.1*    Recent Labs Lab 07/30/16 2046  LIPASE 30    Recent Labs Lab 07/31/16 1548  AMMONIA 57*   Coagulation profile  Recent Labs Lab 07/31/16 0228 08/01/16 0213  INR 1.13 1.21    CBC:  Recent Labs Lab 07/30/16 1537 07/31/16 0228 08/01/16 0213 08/02/16 0239  WBC 10.6* 9.7 6.8 7.1  NEUTROABS 7.6  --  4.3  --   HGB 12.4* 10.5* 9.1* 8.6*  HCT 39.1 33.0* 29.1* 27.8*  MCV 96.5 96.5 96.7 96.2  PLT 494* 441* 353 340   Cardiac Enzymes: No results for  input(s): CKTOTAL, CKMB, CKMBINDEX, TROPONINI in the last 168 hours. BNP (last 3 results) No results for input(s): PROBNP in the last 8760 hours. CBG:  Recent Labs Lab 07/30/16 2338 07/31/16 0648 08/01/16 0630  GLUCAP 240* 84 86   D-Dimer: No results for input(s): DDIMER in the last 72 hours. Hgb A1c: No results for input(s): HGBA1C in the last 72 hours. Lipid Profile: No results for input(s): CHOL, HDL, LDLCALC, TRIG, CHOLHDL, LDLDIRECT in the last 72 hours. Thyroid function studies:  Recent Labs  08/01/16 0213  TSH 1.352   Anemia work up:  Recent Labs  08/01/16 0213  VITAMINB12 383   Sepsis Labs:  Recent Labs Lab 07/30/16 1537 07/30/16 1849 07/31/16 0228 08/01/16 0213 08/02/16 0239  WBC 10.6*  --  9.7 6.8 7.1  LATICACIDVEN  --  1.65  --   --   --  Microbiology Recent Results (from the past 240 hour(s))  MRSA PCR Screening     Status: Abnormal   Collection Time: 07/30/16 11:27 PM  Result Value Ref Range Status   MRSA by PCR POSITIVE (A) NEGATIVE Final    Comment:        The GeneXpert MRSA Assay (FDA approved for NASAL specimens only), is one component of a comprehensive MRSA colonization surveillance program. It is not intended to diagnose MRSA infection nor to guide or monitor treatment for MRSA infections. RESULT CALLED TO, READ BACK BY AND VERIFIED WITH: LB,RN @0153  07/31/16 MKELLY,MLT     Radiology: Ct Head Wo Contrast  Result Date: 07/31/2016 CLINICAL DATA:  Acute encephalopathy.  No pain. EXAM: CT HEAD WITHOUT CONTRAST TECHNIQUE: Contiguous axial images were obtained from the base of the skull through the vertex without intravenous contrast. COMPARISON:  06/23/2016, 06/21/2016 FINDINGS: Brain: No evidence of acute infarction, hemorrhage, extra-axial collection, ventriculomegaly, or mass effect. Left parietal occipital encephalomalacia from prior insult. Right posterior cerebellar encephalomalacia of from prior insult. Generalized cerebral  atrophy. Periventricular white matter low attenuation likely secondary to microangiopathy. Vascular: Cerebrovascular atherosclerotic calcifications are noted. Skull: Negative for fracture or focal lesion. Sinuses/Orbits: Visualized portions of the orbits are unremarkable. Visualized portions of the paranasal sinuses and mastoid air cells are unremarkable. Nasal septum is deviated towards the right. Other: None. IMPRESSION: 1. No acute intracranial pathology. 2. Chronic PCA territory infarcts. Electronically Signed   By: Elige KoHetal  Patel   On: 07/31/2016 12:49    Medications:   . [MAR Hold] amLODipine  10 mg Oral Daily  . [MAR Hold] aspirin EC  81 mg Oral Daily  . [MAR Hold] atorvastatin  40 mg Oral q1800  . [MAR Hold] Chlorhexidine Gluconate Cloth  6 each Topical Q0600  . [MAR Hold] folic acid  1 mg Oral Daily  . [MAR Hold] lactulose  20 g Oral BID  . [MAR Hold] metoprolol tartrate  125 mg Oral BID  . [MAR Hold] mupirocin ointment  1 application Nasal BID  . [MAR Hold] nicotine  21 mg Transdermal Daily  . [MAR Hold] pantoprazole  40 mg Oral Daily  . [MAR Hold] polyethylene glycol  17 g Oral Daily  . [MAR Hold] senna-docusate  1 tablet Oral BID  . [MAR Hold] sodium chloride flush  3 mL Intravenous Q12H  . [MAR Hold] thiamine  100 mg Oral Daily   Continuous Infusions: . sodium chloride 75 mL/hr at 08/02/16 0458  . heparin 700 Units/hr (08/02/16 0455)    Medical decision making is of high complexity and this patient is at high risk of deterioration, therefore this is a level 3 visit.  (> 4 problem points, 2 data points, high risk)    LOS: 3 days   Kaiulani Sitton  [facility] Pager [phone]. If unable to reach me by pager, please call my cell phone at [phone].  *Please refer to [email or web address], password [email or web address] to get updated schedule on who will round on this patient, as hospitalists switch teams weekly. If 7PM-7AM, please contact night-coverage at [email or web address], password [email or web address] for any  overnight needs.  08/02/2016, 7:31 AM

## 2016-08-02 NOTE — Progress Notes (Signed)
Pt has refused dressing changes to cath sites. He became upset with staff and became very agitated when attempting to change and apply dressings. Also refused letting staff change pads with old dried blood.

## 2016-08-02 NOTE — Progress Notes (Signed)
Pt is rolling onto rt side and sitting up. Lt groin dressing saturated; no hematoma.

## 2016-08-03 LAB — BASIC METABOLIC PANEL
ANION GAP: 13 (ref 5–15)
BUN: 5 mg/dL — ABNORMAL LOW (ref 6–20)
CHLORIDE: 101 mmol/L (ref 101–111)
CO2: 23 mmol/L (ref 22–32)
Calcium: 8.7 mg/dL — ABNORMAL LOW (ref 8.9–10.3)
Creatinine, Ser: 0.87 mg/dL (ref 0.61–1.24)
GFR calc Af Amer: >60 mL/min (ref >60)
GFR calc non Af Amer: >60 mL/min (ref >60)
Glucose, Bld: 98 mg/dL (ref 65–99)
Potassium: 3.2 mmol/L — ABNORMAL LOW (ref 3.5–5.1)
Sodium: 137 mmol/L (ref 135–145)

## 2016-08-03 LAB — CBC
HEMATOCRIT: 27 % — AB (ref 39.0–52.0)
HEMOGLOBIN: 8.8 g/dL — AB (ref 13.0–17.0)
MCH: 30.9 pg (ref 26.0–34.0)
MCHC: 32.6 g/dL (ref 30.0–36.0)
MCV: 94.7 fL (ref 78.0–100.0)
Platelets: 317 10*3/uL (ref 150–400)
RBC: 2.85 MIL/uL — AB (ref 4.22–5.81)
RDW: 15.2 % (ref 11.5–15.5)
WBC: 8.9 10*3/uL (ref 4.0–10.5)

## 2016-08-03 LAB — GLUCOSE, CAPILLARY
Glucose-Capillary: 104 mg/dL — ABNORMAL HIGH (ref 65–99)
Glucose-Capillary: 112 mg/dL — ABNORMAL HIGH (ref 65–99)

## 2016-08-03 MED ORDER — NICOTINE 21 MG/24HR TD PT24: 21.0000 mg | MEDICATED_PATCH | Freq: Every day | TRANSDERMAL | 0 refills | Status: DC

## 2016-08-03 MED ORDER — POTASSIUM CHLORIDE CRYS ER 20 MEQ PO TBCR
40.0000 meq | EXTENDED_RELEASE_TABLET | Freq: Once | ORAL | Status: DC
  Filled 2016-08-03 (×2): qty 2

## 2016-08-03 MED ORDER — LACTULOSE 10 GM/15ML PO SOLN: 20.0000 g | Freq: Two times a day (BID) | ORAL | 0 refills | Status: DC

## 2016-08-03 NOTE — Care Management Note (Signed)
Case Management Note Donn PieriniKristi Cuma Polyakov RN, BSN Unit 2W-Case Manager [phone]  Patient Details  Name: Stephen ApleyJerome D Tait MRN: [id number] Date of Birth: Aug 26, 1955  Subjective/Objective:  Pt admitted with ischemic limb                   Action/Plan: PTA pt was at [facility]- CSW has been consulted for return to SNF, CM received referral for possible Lovenox needs- CM to follow for d/c plans/needs  Expected Discharge Date:  08/03/16               Expected Discharge Plan:  Skilled Nursing Facility  In-House Referral:  Clinical Social Work  Discharge planning Services  CM Consult, Medication Assistance  Post Acute Care Choice:  NA Choice offered to:     DME Arranged:    DME Agency:     HH Arranged:    HH Agency:     Status of Service:  Completed, signed off  If discussed at [employer] Meetings, dates discussed:    Discharge Disposition: skilled facility   Additional Comments:  08/03/16- 1100- Catriona Dillenbeck RN, CM- pt for d/c today- CSW following and has been able to get another LOG for [facility].   Darrold SpanWebster, Adit Riddles Hall, RN 08/03/2016, 11:02 AM

## 2016-08-03 NOTE — Evaluation (Signed)
Physical Therapy Evaluation Patient Details Name: Stephen Bowen MRN: [id number] DOB: [birth year] Today's Date: 08/03/2016   History of Present Illness  61 y.o. male with RLE ischemia s/p rigtl tibial and left femoral artery cannulation. Pt with recent D/C to SNF after admitted for management of left ischemic foot secondary to occluded left femoral to posterior tibial bypass. He underwent thromboembolectomy of the bypass graft.  During the procedure the patient was having ST changes and cardiology was consulted. Pt with MI. The patient on awakening from anesthesia was aphasic and IP Code Stroke was called. CT 12/28 acute infarct involving a portion of the posterior mid to inferior right cerebellum as well as much of the left occipital lobe, particularly medially and mid portions. 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 PMHx: HTN, PAD    Clinical Impression  Pt pleasant and familiar from last admission. Pt unable to recall if he has walked at all since D/C to SNF or what his function has been since D/C. Pt with apparent increased vision today with ability to recognize location of therapist without sound as well as covers and adjust them but could not state items in visual field. Pt with decreased strength, function, mobility, gait and balance who will benefit from acute therapy to maximize mobility, function and independence to decrease burden of care and fall risk, maximizing quality of life. Prevalon boot on RLE end of session.     Follow Up Recommendations SNF    Equipment Recommendations  Rolling walker with 5" wheels    Recommendations for Other Services       Precautions / Restrictions Precautions Precautions: Fall Precaution Comments: L AKA, blind, right heel wounds Restrictions LLE Weight Bearing: Non weight bearing      Mobility  Bed Mobility Overal bed mobility: Needs Assistance Bed Mobility: Supine to Sit;Sit to Supine     Supine to sit: Min guard Sit to supine: Min guard   General bed  mobility comments: cues for sequence and direction with increased time and pt limited by pain needing several attempts to get to EOB. Max assist x2 to scoot fully to [facility]  Transfers Overall transfer level: Needs assistance   Transfers: Sit to/from Stand Sit to Stand: Min assist         General transfer comment: assist for hand placement on RW and rail with pt able to initiate stand and clear buttock but could not achieve fully upright due to painful RLE with weightbearing  Ambulation/Gait                Stairs            Wheelchair Mobility    Modified Rankin (Stroke Patients Only)       Balance Overall balance assessment: Needs assistance   Sitting balance-Leahy Scale: Fair                                       Pertinent Vitals/Pain Pain Score: 7  Pain Location: right leg Pain Descriptors / Indicators: Throbbing;Aching Pain Intervention(s): Limited activity within patient's tolerance;Repositioned;Monitored during session    Home Living Family/patient expects to be discharged to:: Skilled nursing facility                      Prior Function Level of Independence: Needs assistance   Gait / Transfers Assistance Needed: min assist for basic transfers last admission with pt unable to state  what mobility he has performed at [facility]  ADL's / Homemaking Assistance Needed: max assist for ADLs        Hand Dominance        Extremity/Trunk Assessment   Upper Extremity Assessment Upper Extremity Assessment: Generalized weakness    Lower Extremity Assessment Lower Extremity Assessment: Generalized weakness    Cervical / Trunk Assessment Cervical / Trunk Assessment: Normal  Communication   Communication: No difficulties  Cognition Arousal/Alertness: Awake/alert Behavior During Therapy: WFL for tasks assessed/performed Overall Cognitive Status: Impaired/Different from baseline Area of Impairment: Following  commands;Memory;Safety/judgement;Awareness;Problem solving;Orientation Orientation Level: Place;Time;Situation;Disoriented to Current Attention Level: Sustained Memory: Decreased short-term memory Following Commands: Follows one step commands consistently Safety/Judgement: Decreased awareness of safety;Decreased awareness of deficits     General Comments: Cues for sequencing, initiation    General Comments      Exercises     Assessment/Plan    PT Assessment Patient needs continued PT services  PT Problem List Decreased activity tolerance;Decreased balance;Decreased strength;Decreased mobility;Decreased safety awareness;Decreased cognition;Decreased knowledge of use of DME;Impaired sensation;Pain          PT Treatment Interventions DME instruction;Therapeutic exercise;Patient/family education;Therapeutic activities;Balance training;Wheelchair mobility training;Functional mobility training;Neuromuscular re-education    PT Goals (Current goals can be found in the Care Plan section)  Acute Rehab PT Goals Patient Stated Goal: none stated PT Goal Formulation: With patient Time For Goal Achievement: 08/17/16 Potential to Achieve Goals: Fair    Frequency Min 2X/week   Barriers to discharge        Co-evaluation               End of Session Equipment Utilized During Treatment: Gait belt Activity Tolerance: Patient limited by pain Patient left: in bed;with bed alarm set;with call bell/phone within reach Nurse Communication: Mobility status         Time: 1610-9604 PT Time Calculation (min) (ACUTE ONLY): 16 min   Charges:   PT Evaluation $PT Eval Moderate Complexity: 1 Procedure     PT G Codes:        Farryn Linares B Jesusmanuel Erbes Aug 12, 2016, 12:10 PM  Delaney Meigs, PT [phone]

## 2016-08-03 NOTE — Discharge Summary (Signed)
Physician Discharge Summary  Stephen Bowen Micheletti MVH:[id number] DOB: 03-28-1956 DOA: 07/30/2016  PCP: No PCP Per Patient  Admit date: 07/30/2016 Discharge date: 08/03/2016  Admitted From: SNF Discharge disposition: SNF   Recommendations for Outpatient Follow-Up:   1. F/U with vascular surgery in 4 weeks. 2. Repeat electrolytes in 48 hours to ensure his potassium has normalized.   Discharge Diagnosis:   Principal Problem:    Limb ischemia Active Problems:    Tobacco use disorder    PAD (peripheral artery disease) (HCC)    Hyperkalemia    Essential hypertension    Femoral-tibial bypass graft occlusion, left (HCC)    Stroke (HCC)    AKI (acute kidney injury) (HCC)    HLD (hyperlipidemia)    GERD (gastroesophageal reflux disease)    Acute encephalopathy    Abdominal pain    Aortic atherosclerosis (HCC)    Coronary atherosclerosis of native coronary artery    Underweight    MRSA carrier   Discharge Condition: Improved.  Diet recommendation: Dysphagia II, thin liquids.  Wound care: Per vascular surgery.  Staples will need to be removed per their recommendations.   History of Present Illness:   Stephen Bowen Shambaugh is an 61 y.o. 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  Per report, pt was recently admitted for arterial occlusion of the left leg had a bypass and failed therefore had an above-the-knee amputation. 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 He has been in a rehabilitation facility since then.   Hospital Course by Problem:   Principal problem:  Limb ischemia of right leg and foot VVS, Dr. Randie Heinzain was consulted by EDP-->recommended  therapeutic heparin with angiogram done 08/02/16. Status post stent of right proximal SFA and popliteal arteries as well as balloon angioplasty of the right peroneal artery. Plavix resumed. Heparin discontinued 08/02/16. Cleared for discharge by vascular surgery with recommendations to follow-up in 4 weeks.  Active problems:  Hyperkalemia/hypokalemia Both treated appropriately with discharge potassium slightly low, so was given 40 mEq of oral replacement prior to discharge. Since we are resuming spironolactone at discharge, would avoid routine supplementation until his potassium level is rechecked in 48 hours.  HTN Nephrotoxic medications on hold including lisinopril and spironolactone. Norvasc held secondary to soft blood pressure. Norvasc resumed 08/01/16. Continue metoprolol and IV hydralazine as needed.  History of stroke/coronary artery atherosclerosis/aortic atherosclerosis Continue risk factor modification including and aspirin/Lipitor.  Tobacco abuse: The patient has been counseled. Continue nicotine patch.  AKI Patient's baseline creatinine is 0.93. Noted to have a creatinine of 2.29 on admission which has steadily come down with holding nephrotoxic medications and hydration. Current creatinine now back to baseline values. Okay to resume spironolactone at discharge.  HLD Last LDL was 37 on 06/22/16. Continue Lipitor.  GERD Continue Protonix.  Abdominal pain CT scan suboptimal due to lack of IV contrast and the fact. No acute process in the abdomen or pelvis noted.  Acute encephalopathy Ammonia level was elevated, which may be contributory. Continue lactulose. CT of the head shows chronic PCA territory infarcts.  Underweight Body mass index is 14.52 kg/m.    MRSA carrier Decontamination therapy provided.   Medical Consultants:    Vascular Surgery: Apolinar JunesBrandon  Addison Bailey, MD   Discharge Exam:   Vitals:   08/03/16 0612 08/03/16 0926  BP: 139/76 110/65    Pulse: 85   Resp: 16   Temp: 98.3 F (36.8 C)    Vitals:   08/02/16 2028 08/02/16 2220 08/03/16 0612 08/03/16 0926  BP: 131/86 115/67 139/76 110/65  Pulse: (!) 101 77 85   Resp: 16  16   Temp: 98.3 F (36.8 C) 98.1 F (36.7 C) 98.3 F (36.8 C)   TempSrc: Oral  Axillary   SpO2: 100% 100% 100%   Weight:      Height:       General exam: Calm, no acute distress. Respiratory system: Clear to auscultation. Respiratory effort normal. Cardiovascular system: S1 & S2 heard, RRR. No JVD,  rubs, gallops or clicks. No murmurs. Gastrointestinal system: Abdomen is nondistended, soft and nontender. No organomegaly or masses felt. Normal bowel sounds heard. Central nervous system: Alert and oriented to self and place, still a bit confused. No focal neurological deficits. Extremities: Left BKA with staples as pictured below. Right foot in Prevlon boot. Foot warm to touch with faint pedal pulse. Skin: No rashes, lesions or ulcers. Left BKA staple line as noted, unchanged. Psychiatry: Judgement and insight appear normal. Mood & affect appropriate.        The results of significant diagnostics from this hospitalization (including imaging, microbiology, ancillary and laboratory) are listed below for reference.     Procedures and Diagnostic Studies:   Ct Abdomen Pelvis Wo Contrast  Result Date: 07/31/2016 CLINICAL DATA:  Abdominal tenderness on physical exam. Altered mental status. EXAM: CT ABDOMEN AND PELVIS WITHOUT CONTRAST TECHNIQUE: Multidetector CT imaging of the abdomen and pelvis was performed following the standard protocol without IV contrast. COMPARISON:  None. FINDINGS: Lower chest: Clear lung bases. Normal heart size without pericardial or pleural effusion. Right coronary artery atherosclerosis. Hepatobiliary: Normal noncontrast appearance of the liver. Normal gallbladder, without biliary ductal dilatation. Pancreas: Degradation secondary patient arm position and susceptibility  artifact from wires and leads. Grossly normal noncontrast appearance of the pancreas. Spleen: Normal in size, without focal abnormality. Adrenals/Urinary Tract: Normal adrenal glands. No renal calculi or hydronephrosis. Ureters difficult to follow. No bladder calculi. Stomach/Bowel: Normal stomach, without wall thickening. Normal colon. Normal appendix on image 46. Normal small bowel. Vascular/Lymphatic: Aortic and branch vessel atherosclerosis. No abdominopelvic adenopathy. Reproductive: Normal prostate. Other: No significant free fluid. Tiny fat containing left inguinal hernia. Musculoskeletal: No acute osseous abnormality. IMPRESSION: 1. Multifactorial degradation, including lack of IV contrast and extensive susceptibility artifact from support apparatus. 2. Given this limitation, no acute process in the abdomen or pelvis. 3.  Coronary artery atherosclerosis. Aortic atherosclerosis. Electronically Signed   By: Jeronimo Greaves M.Bowen.   On: 07/31/2016 07:01   Ct Head Wo Contrast  Result Date: 07/31/2016 CLINICAL DATA:  Acute encephalopathy.  No pain. EXAM: CT HEAD WITHOUT CONTRAST TECHNIQUE: Contiguous axial images were obtained from the base of the skull through the vertex without intravenous contrast. COMPARISON:  06/23/2016, 06/21/2016 FINDINGS: Brain: No evidence of acute infarction, hemorrhage, extra-axial collection, ventriculomegaly, or mass effect. Left parietal occipital encephalomalacia from prior insult. Right posterior cerebellar encephalomalacia of from prior insult. Generalized cerebral atrophy. Periventricular white matter low attenuation likely secondary to microangiopathy. Vascular: Cerebrovascular atherosclerotic calcifications are noted. Skull: Negative for fracture or focal lesion. Sinuses/Orbits: Visualized portions of the orbits are unremarkable. Visualized portions of the paranasal sinuses and mastoid air cells are unremarkable. Nasal septum is deviated towards the right. Other: None.  IMPRESSION: 1. No acute intracranial pathology. 2. Chronic PCA territory infarcts. Electronically Signed   By: Elige Ko   On: 07/31/2016 12:49     Labs:   Basic Metabolic Panel:  Recent Labs Lab 07/31/16 0228 07/31/16 1548 08/01/16 0213 08/02/16 0239 08/03/16 0324  NA 146* 144 144 139 137  K 5.1 4.2 4.2 3.4* 3.2*  CL 114* 111 110 107 101  CO2 20* 24 25 23 23   GLUCOSE 71 106* 100* 101* 98  BUN 49* 26* 17 6 5*  CREATININE 1.68* 1.20 1.08 0.90 0.87  CALCIUM 9.4 9.1 8.8* 8.4* 8.7*  MG  --   --  2.2  --   --    GFR Estimated Creatinine Clearance: 55.3 mL/min (by C-G formula based on SCr of 0.87 mg/dL). Liver Function Tests:  Recent Labs Lab 07/30/16 1537 07/31/16 1548 08/01/16 0213  AST 18 14* 12*  ALT 24 18 15*  ALKPHOS 108 85 75  BILITOT 0.5 0.7 0.5  PROT 8.9* 7.1 6.3*  ALBUMIN 4.3 3.4* 3.1*    Recent Labs Lab 07/30/16 2046  LIPASE 30    Recent Labs Lab 07/31/16 1548  AMMONIA 57*   Coagulation profile  Recent Labs Lab 07/31/16 0228 08/01/16 0213  INR 1.13 1.21    CBC:  Recent Labs Lab 07/30/16 1537 07/31/16 0228 08/01/16 0213 08/02/16 0239 08/03/16 0324  WBC 10.6* 9.7 6.8 7.1 8.9  NEUTROABS 7.6  --  4.3  --   --   HGB 12.4* 10.5* 9.1* 8.6* 8.8*  HCT 39.1 33.0* 29.1* 27.8* 27.0*  MCV 96.5 96.5 96.7 96.2 94.7  PLT 494* 441* 353 340 317   Cardiac Enzymes: No results for input(s): CKTOTAL, CKMB, CKMBINDEX, TROPONINI in the last 168 hours. BNP: Invalid input(s): POCBNP CBG:  Recent Labs Lab 07/30/16 2338 07/31/16 0648 08/01/16 0630 08/03/16 0620  GLUCAP 240* 84 86 104*   Bowen-Dimer No results for input(s): DDIMER in the last 72 hours. Hgb A1c No results for input(s): HGBA1C in the last 72 hours. Lipid Profile No results for input(s): CHOL, HDL, LDLCALC, TRIG, CHOLHDL, LDLDIRECT in the last 72 hours. Thyroid function studies  Recent Labs  08/01/16 0213  TSH 1.352   Anemia work up  Recent Labs  08/01/16 0213    [id number]   Microbiology Recent Results (from the past 240 hour(s))  MRSA PCR Screening     Status: Abnormal   Collection Time: 07/30/16 11:27 PM  Result Value Ref Range Status   MRSA by PCR POSITIVE (A) NEGATIVE Final    Comment:        The GeneXpert MRSA Assay (FDA approved for NASAL specimens only), is one component of a comprehensive MRSA colonization surveillance program. It is not intended to diagnose MRSA infection nor to guide or monitor treatment for MRSA infections. RESULT CALLED TO, READ BACK BY AND VERIFIED WITH: LB,RN @0153  07/31/16 MKELLY,MLT      Discharge Instructions:   Discharge Instructions    Call MD for:  extreme fatigue    Complete by:  As directed    Call MD for:  persistant dizziness or light-headedness    Complete by:  As directed    Call MD for:  redness, tenderness, or signs of infection (pain, swelling, redness, odor or green/yellow discharge around incision site)    Complete by:  As directed    Call MD for:  severe uncontrolled pain    Complete by:  As directed    Call MD for:  temperature >  100.4    Complete by:  As directed    Diet - low sodium heart healthy    Complete by:  As directed    Increase activity slowly    Complete by:  As directed      Allergies as of 08/03/2016   No Known Allergies     Medication List    STOP taking these medications   ciprofloxacin 250 MG tablet Commonly known as:  CIPRO   lisinopril 20 MG tablet Commonly known as:  PRINIVIL,ZESTRIL   oseltamivir 75 MG capsule Commonly known as:  TAMIFLU   oxyCODONE-acetaminophen 5-325 MG tablet Commonly known as:  ROXICET     TAKE these medications   amLODipine 10 MG tablet Commonly known as:  NORVASC Take 1 tablet (10 mg total) by mouth daily.   aspirin 81 MG EC tablet Take 1 tablet (81 mg total) by mouth daily.   atorvastatin 40 MG tablet Commonly known as:  LIPITOR Take 1 tablet (40 mg total) by mouth daily at 6 PM.   bisacodyl 5 MG EC  tablet Commonly known as:  DULCOLAX Take 1 tablet (5 mg total) by mouth daily as needed for moderate constipation.   clopidogrel 75 MG tablet Commonly known as:  PLAVIX Take 1 tablet (75 mg total) by mouth daily.   docusate sodium 100 MG capsule Commonly known as:  COLACE Take 1 capsule (100 mg total) by mouth daily.   folic acid 1 MG tablet Commonly known as:  FOLVITE Take 1 tablet (1 mg total) by mouth daily.   lactulose 10 GM/15ML solution Commonly known as:  CHRONULAC Take 30 mLs (20 g total) by mouth 2 (two) times daily.   magnesium hydroxide 400 MG/5ML suspension Commonly known as:  MILK OF MAGNESIA Take 30 mLs by mouth daily.   metoprolol tartrate 25 MG tablet Commonly known as:  LOPRESSOR Take 5 tablets (125 mg total) by mouth 2 (two) times daily.   nicotine 21 mg/24hr patch Commonly known as:  NICODERM CQ - dosed in mg/24 hours Place 1 patch (21 mg total) onto the skin daily. Start taking on:  08/04/2016   selenium sulfide 1 % Lotn Commonly known as:  SELSUN Put on your face in the shower and wash off daily   spironolactone 25 MG tablet Commonly known as:  ALDACTONE Take 1 tablet (25 mg total) by mouth daily.   thiamine 100 MG tablet Take 1 tablet (100 mg total) by mouth daily.      Follow-up Information    Lemar Livings, MD Follow up in 4 week(s).   Specialties:  Vascular Surgery, Cardiology Why:  Our office will call you to arrange an appointment  Contact information: [address] [place] [address] [phone]            Time coordinating discharge: 35 minutes.  Signed:  Rafferty Postlewait  Pager [phone] [facility] 08/03/2016, 11:04 AM

## 2016-08-03 NOTE — Progress Notes (Signed)
Clinical Social Worker facilitated patient discharge including contacting patient family and facility to confirm patient discharge plans.  Clinical information faxed to facility and family agreeable with plan.  CSW arranged ambulance transport via [facility] to [facility] .  RN Dereck to call [phone] for report prior to discharge.  Clinical Social Worker will sign off for now as social work intervention is no longer needed. Please consult us again if new need arises.  Marrianne MoodAshley Meriem Lemieux, MSW, [employer] [phone]

## 2016-08-03 NOTE — Progress Notes (Signed)
Clinical Social Worker was able to attain Letter of Guarantee (LOG) on behalf of patient which will cover 30 day room/board and 2 weeks of PT/OT. Patient is able to return to [facility].  Marrianne MoodAshley Jemell Town, MSW,  [employer]WA [phone]

## 2016-08-03 NOTE — [place] FL2 (Signed)
[place] MEDICAID FL2 LEVEL OF CARE SCREENING TOOL     IDENTIFICATION  Patient Name: Stephen Bowen Birthdate: June 20, 1956 Sex: male Admission Date (Current Location): 07/30/2016  [place] [facility]   Facility and Address:  [facility] [place], [address], [place], [place] [address]      Provider Number: [id number]  Attending Physician Name and Address:  Maryruth Bunhristina P Rama, MD  Relative Name and Phone Number:  Allyson Saballford Tilmon    Current Level of Care: Hospital Recommended Level of Care: Skilled Nursing Facility Prior Approval Number:    Date Approved/Denied:   PASRR Number: [id number]  Discharge Plan:      Current Diagnoses: Patient Active Problem List   Diagnosis Date Noted  . 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 Aortic atherosclerosis (HCC) 08/01/2016  . Coronary atherosclerosis of native coronary artery 08/01/2016  . Underweight 08/01/2016  . AKI (acute kidney injury) (HCC) 07/30/2016  . Limb ischemia 07/30/2016  . HLD (hyperlipidemia) 07/30/2016  . GERD (gastroesophageal reflux disease) 07/30/2016  . Acute encephalopathy 07/30/2016  . Abdominal pain 07/30/2016  . Pressure injury of skin 06/28/2016  . 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 Aphasia   . Hypotension 06/21/2016  . Stroke (HCC) 06/21/2016  . Acute MI, anterolateral wall, initial episode of care (HCC)   . 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 Preoperative cardiovascular examination   . PAD (peripheral artery disease) (HCC) 02/13/2016  . Leg pain 02/13/2016  . Hyperkalemia 02/13/2016  . Lactic acid increased 02/13/2016  . Essential hypertension 02/13/2016  . Seborrheic dermatitis 08/31/2015  . Actinic keratosis of multiple sites of head and neck 08/31/2015  . Tobacco use disorder 08/31/2015  . Skin ulcer of scalp (HCC) 08/31/2015  . Atherosclerosis of native arteries of extremity with  intermittent claudication (HCC) 07/07/2015  . Abnormal EKG 11/13/2013  . Hypertensive urgency 11/13/2013    Orientation RESPIRATION BLADDER Height & Weight            Weight: 95 lb 8 oz (43.3 kg) Height:  5\' 8"  (172.7 cm)  BEHAVIORAL SYMPTOMS/MOOD NEUROLOGICAL BOWEL NUTRITION STATUS           AMBULATORY STATUS COMMUNICATION OF NEEDS Skin                               Personal Care Assistance Level of Assistance              Functional Limitations Info             SPECIAL CARE FACTORS FREQUENCY                       Contractures      Additional Factors Info                  Current Medications (08/03/2016):  This is the current hospital active medication list Current Facility-Administered Medications  Medication Dose Route Frequency Provider Last Rate Last Dose  . 0.9 % NaCl with KCl 20 mEq/ L  infusion   Intravenous Continuous Maryruth Bunhristina P Rama, MD 10 mL/hr at 08/02/16 1900    . acetaminophen (TYLENOL) tablet 650 mg  650 mg Oral Q6H PRN Lorretta HarpXilin Niu, MD   650 mg at 07/31/16 1502   Or  . acetaminophen (TYLENOL) suppository 650 mg  650 mg Rectal  Q6H PRN Lorretta Harp, MD      . amLODipine (NORVASC) tablet 10 mg  10 mg Oral Daily Maryruth Bun Rama, MD   10 mg at 08/02/16 1553  . aspirin EC tablet 81 mg  81 mg Oral Daily Lorretta Harp, MD   81 mg at 08/03/16 0929  . atorvastatin (LIPITOR) tablet 40 mg  40 mg Oral q1800 Lorretta Harp, MD   40 mg at 08/01/16 1640  . bisacodyl (DULCOLAX) EC tablet 5 mg  5 mg Oral Daily PRN Lorretta Harp, MD      . Chlorhexidine Gluconate Cloth 2 % PADS 6 each  6 each Topical Q0600 Lorretta Harp, MD   6 each at 08/02/16 [phone]  . clopidogrel (PLAVIX) tablet 75 mg  75 mg Oral Daily Maeola Harman, MD   75 mg at 08/03/16 0929  . enoxaparin (LOVENOX) injection 30 mg  30 mg Subcutaneous Q24H Maryruth Bun Rama, MD   30 mg at 08/02/16 1554  . folic acid (FOLVITE) tablet 1 mg  1 mg Oral Daily Lorretta Harp, MD   1 mg at 08/03/16 9604  . hydrALAZINE  (APRESOLINE) injection 5 mg  5 mg Intravenous Q2H PRN Lorretta Harp, MD      . HYDROcodone-acetaminophen (NORCO/VICODIN) 5-325 MG per tablet 1 tablet  1 tablet Oral Q6H PRN Rolly Salter, MD   1 tablet at 08/02/16 0015  . lactulose (CHRONULAC) 10 GM/15ML solution 20 g  20 g Oral BID Rolly Salter, MD   20 g at 08/03/16 0929  . magnesium hydroxide (MILK OF MAGNESIA) suspension 30 mL  30 mL Oral Daily PRN Lorretta Harp, MD      . methocarbamol (ROBAXIN) tablet 500 mg  500 mg Oral Q8H PRN Rolly Salter, MD      . metoprolol tartrate (LOPRESSOR) tablet 125 mg  125 mg Oral BID Lorretta Harp, MD   125 mg at 08/03/16 0929  . mupirocin ointment (BACTROBAN) 2 % 1 application  1 application Nasal BID Lorretta Harp, MD   1 application at 08/03/16 [phone]  . nicotine (NICODERM CQ - dosed in mg/24 hours) patch 21 mg  21 mg Transdermal Daily Lorretta Harp, MD   21 mg at 08/03/16 8119  . ondansetron (ZOFRAN) injection 4 mg  4 mg Intravenous Q8H PRN Lorretta Harp, MD      . oxyCODONE-acetaminophen (PERCOCET/ROXICET) 5-325 MG per tablet 1-2 tablet  1-2 tablet Oral Q3H PRN Maeola Harman, MD   1 tablet at 08/02/16 1337  . pantoprazole (PROTONIX) EC tablet 40 mg  40 mg Oral Daily Lorretta Harp, MD   40 mg at 08/03/16 0929  . polyethylene glycol (MIRALAX / GLYCOLAX) packet 17 g  17 g Oral Daily Rolly Salter, MD   17 g at 08/03/16 0929  . potassium chloride SA (K-DUR,KLOR-CON) CR tablet 40 mEq  40 mEq Oral Once Maryruth Bun Rama, MD      . senna-docusate (Senokot-S) tablet 1 tablet  1 tablet Oral BID Rolly Salter, MD   1 tablet at 08/03/16 [phone]  . sodium chloride flush (NS) 0.9 % injection 3 mL  3 mL Intravenous Q12H Lorretta Harp, MD   3 mL at 08/01/16 0941  . thiamine (VITAMIN B-1) tablet 100 mg  100 mg Oral Daily Lorretta Harp, MD   100 mg at 08/03/16 2956     Discharge Medications: Please see discharge summary for a list of discharge medications.  Relevant Imaging Results:  Relevant Lab Results:  Additional Information     Althea Charon, LCSW

## 2016-08-03 NOTE — Progress Notes (Addendum)
  [facility] Progress Note  Subjective  - POD #1  Right foot is hurting  Objective Vitals:   08/02/16 2220 08/03/16 0612  BP: 115/67 139/76  Pulse: 77 85  Resp:  16  Temp: 98.1 F (36.7 C) 98.3 F (36.8 C)    Intake/Output Summary (Last 24 hours) at 08/03/16 0741 Last data filed at 08/02/16 2100  Gross per 24 hour  Intake              240 ml  Output             1600 ml  Net            -1360 ml   Left groin without hematoma. Right shin without hematoma Right foot with ischemic heel Brisk right AT and PT doppler signals.  Left AKA with dried blood at incision. Otherwise healing well.   Assessment/Planning: 61 y.o. male is s/p:  1.  US guided cannulation left common femoral artery 2.  US guided cannulation of right anterior tibial artery 3.  Aortogram with right lower extremity runoff 4.  Stent of right proximal sfa with 6 x 40 innova 5.  Stent of right sfa and popliteal artery with viabahn 5 x 250mm and extended with 5 x 50mm 6.  Balloon angioplasty of right peroneal artery with 3mm balloon 1 Day Post-Op   Continue on ASA and Plavix Should have adequate bloodflow to heal right heel wound. Need to float right heel off of bed. Ok to d/c from vascular standpoint. F/u arranged.  Will have RN remove staples left AKA.    Raymond GurneyKimberly A Trinh 08/03/2016 7:41 AM --  Laboratory CBC    Component Value Date/Time   WBC 8.9 08/03/2016 0324   HGB 8.8 (L) 08/03/2016 0324   HCT 27.0 (L) 08/03/2016 0324   PLT 317 08/03/2016 0324    BMET    Component Value Date/Time   NA 137 08/03/2016 0324   K 3.2 (L) 08/03/2016 0324   CL 101 08/03/2016 0324   CO2 23 08/03/2016 0324   GLUCOSE 98 08/03/2016 0324   BUN 5 (L) 08/03/2016 0324   CREATININE 0.87 08/03/2016 0324   CREATININE 0.92 11/13/2013 1031   CALCIUM 8.7 (L) 08/03/2016 0324   GFRNONAA >60 08/03/2016 0324   GFRNONAA >89 11/13/2013 1031   GFRAA >60 08/03/2016 0324   GFRAA >89 11/13/2013 1031     COAG Lab Results  Component Value Date   INR 1.21 08/01/2016   INR 1.13 07/31/2016   INR 1.07 06/25/2016   No results found for: PTT  Antibiotics Anti-infectives    None       Maris BergerKimberly Trinh, PA-C [facility] Office: [phone] Pager: [phone] 08/03/2016 7:41 AM    I have independently interviewed patient and agree with PA assessment and plan above.   Justiss Gerbino C. Randie Heinzain, MD [facility] [place] Office: [phone] Pager: [phone]

## 2016-08-14 ENCOUNTER — Other Ambulatory Visit: Payer: Self-pay

## 2016-08-14 DIAGNOSIS — I739 Peripheral vascular disease, unspecified: Secondary | ICD-10-CM

## 2016-08-17 ENCOUNTER — Encounter: Payer: Self-pay | Admitting: Vascular Surgery

## 2016-08-20 ENCOUNTER — Telehealth: Payer: Self-pay | Admitting: General Practice

## 2016-08-20 ENCOUNTER — Encounter (HOSPITAL_COMMUNITY): Payer: Self-pay

## 2016-08-20 ENCOUNTER — Ambulatory Visit (HOSPITAL_COMMUNITY)
Admission: RE | Admit: 2016-08-20 | Discharge: 2016-08-20 | Disposition: A | Discharge status: Home / Self Care | Payer: Medicaid Other | Source: Ambulatory Visit | Attending: Vascular Surgery | Admitting: Vascular Surgery

## 2016-08-20 DIAGNOSIS — I739 Peripheral vascular disease, unspecified: Secondary | ICD-10-CM | POA: Insufficient documentation

## 2016-08-20 DIAGNOSIS — R938 Abnormal findings on diagnostic imaging of other specified body structures: Secondary | ICD-10-CM | POA: Insufficient documentation

## 2016-08-20 DIAGNOSIS — R0989 Other specified symptoms and signs involving the circulatory and respiratory systems: Secondary | ICD-10-CM | POA: Diagnosis present

## 2016-08-20 NOTE — Telephone Encounter (Signed)
CALLED, LMTCB IT IS TIME TO RENEW GCCN CARD

## 2016-08-21 ENCOUNTER — Inpatient Hospital Stay (HOSPITAL_COMMUNITY): Admission: RE | Admit: 2016-08-21 | Payer: Self-pay | Source: Ambulatory Visit

## 2016-08-24 ENCOUNTER — Encounter: Payer: Self-pay | Admitting: Vascular Surgery

## 2016-08-31 ENCOUNTER — Ambulatory Visit (INDEPENDENT_AMBULATORY_CARE_PROVIDER_SITE_OTHER): Payer: Self-pay | Admitting: Vascular Surgery

## 2016-08-31 ENCOUNTER — Encounter (HOSPITAL_COMMUNITY): Payer: Self-pay | Admitting: *Deleted

## 2016-08-31 ENCOUNTER — Encounter: Payer: Self-pay | Admitting: Vascular Surgery

## 2016-08-31 VITALS — BP 128/91 | HR 108 | Temp 97.3°F | Resp 20 | Ht 68.0 in | Wt 95.0 lb

## 2016-08-31 DIAGNOSIS — T8789 Other complications of amputation stump: Secondary | ICD-10-CM

## 2016-08-31 NOTE — Progress Notes (Signed)
     Patient ID: Stephen Bowen, male   DOB: 07-Nov-1955, 61 y.o.   MRN: [id number]  HPI 61 year old male follows up from recent right lower extremity revascularization with stenting of his right lower extremity for heel ulceration that is now healed. He does not have complaints on today's visit. He does however have breakdown of his left above-the-knee of dictation site. His speech and vision are improving. [facility].   Review of Systems No complaints    Objective:   Physical Exam Awake and alert Non labored respirations R heel ulcer nearly healed, strong right peroneal signal Left aka site with medial breakdown x 6cm     Assessment/plan     Stephen Bowen follows up for recent right lower extremity revascularization which is done very well. He unfortunately has breakdown of his left above-the-knee of dictation site. I attempted debridement at the bedside but he cannot tolerate this at all. He will need operative debridement which we'll planMonday. We'll likely place wound VAC and he'll need that set up prior to returning to his snf  Patient: This Stephen Lemen C. Randie Heinzain, MD [facility] [place] Office: [phone] Pager: [phone]

## 2016-08-31 NOTE — Progress Notes (Signed)
SDW-Pre-op call completed by Lelon MastSamantha, CMA. Lelon MastSamantha denies that pt C/O SOB and chest pain. Lelon MastSamantha denies that pt is under the care of a cardiologist. Lelon MastSamantha stated that MD advised that pt take Plavix, Aspirin and BP medications on DOS. Lelon MastSamantha made aware to have pt stop taking vitamins, fish oil, herbal medications and NSAID's. Please measure pt neck and complete the Apnea Screening and complete Anesthesia Assessment with pt on DOS. Samantha verbalized understanding of all pre-op instructions.

## 2016-09-03 ENCOUNTER — Encounter (HOSPITAL_COMMUNITY): Payer: Self-pay | Admitting: *Deleted

## 2016-09-03 ENCOUNTER — Ambulatory Visit (HOSPITAL_COMMUNITY): Payer: Medicaid Other | Admitting: Anesthesiology

## 2016-09-03 ENCOUNTER — Encounter (HOSPITAL_COMMUNITY)
Admission: RE | Disposition: A | Discharge status: Skilled Nursing Facility | Payer: Self-pay | Source: Ambulatory Visit | Attending: Vascular Surgery

## 2016-09-03 ENCOUNTER — Inpatient Hospital Stay (HOSPITAL_COMMUNITY)
Admission: RE | Admit: 2016-09-03 | Discharge: 2016-09-05 | DRG: 465 | Disposition: A | Discharge status: Skilled Nursing Facility | Payer: Medicaid Other | Source: Ambulatory Visit | Attending: Vascular Surgery | Admitting: Vascular Surgery

## 2016-09-03 DIAGNOSIS — I10 Essential (primary) hypertension: Secondary | ICD-10-CM | POA: Diagnosis present

## 2016-09-03 DIAGNOSIS — I252 Old myocardial infarction: Secondary | ICD-10-CM | POA: Diagnosis not present

## 2016-09-03 DIAGNOSIS — Z87891 Personal history of nicotine dependence: Secondary | ICD-10-CM | POA: Diagnosis not present

## 2016-09-03 DIAGNOSIS — T8789 Other complications of amputation stump: Principal | ICD-10-CM | POA: Diagnosis present

## 2016-09-03 DIAGNOSIS — I2510 Atherosclerotic heart disease of native coronary artery without angina pectoris: Secondary | ICD-10-CM | POA: Diagnosis present

## 2016-09-03 DIAGNOSIS — Z8673 Personal history of transient ischemic attack (TIA), and cerebral infarction without residual deficits: Secondary | ICD-10-CM

## 2016-09-03 DIAGNOSIS — Z7902 Long term (current) use of antithrombotics/antiplatelets: Secondary | ICD-10-CM | POA: Diagnosis not present

## 2016-09-03 DIAGNOSIS — Y835 Amputation of limb(s) as the cause of abnormal reaction of the patient, or of later complication, without mention of misadventure at the time of the procedure: Secondary | ICD-10-CM | POA: Diagnosis present

## 2016-09-03 DIAGNOSIS — Z7982 Long term (current) use of aspirin: Secondary | ICD-10-CM

## 2016-09-03 DIAGNOSIS — S81802A Unspecified open wound, left lower leg, initial encounter: Secondary | ICD-10-CM | POA: Diagnosis present

## 2016-09-03 DIAGNOSIS — I739 Peripheral vascular disease, unspecified: Secondary | ICD-10-CM | POA: Diagnosis present

## 2016-09-03 DIAGNOSIS — Z79899 Other long term (current) drug therapy: Secondary | ICD-10-CM | POA: Diagnosis not present

## 2016-09-03 DIAGNOSIS — K219 Gastro-esophageal reflux disease without esophagitis: Secondary | ICD-10-CM | POA: Diagnosis present

## 2016-09-03 DIAGNOSIS — T8189XA Other complications of procedures, not elsewhere classified, initial encounter: Secondary | ICD-10-CM

## 2016-09-03 HISTORY — PX: I & D EXTREMITY: SHX5045

## 2016-09-03 HISTORY — DX: Other complications of procedures, not elsewhere classified, initial encounter: T81.89XA

## 2016-09-03 LAB — CBC
HEMATOCRIT: 40.1 % (ref 39.0–52.0)
HEMATOCRIT: 36.8 % — AB (ref 39.0–52.0)
HEMOGLOBIN: 12.9 g/dL — AB (ref 13.0–17.0)
Hemoglobin: 11.8 g/dL — ABNORMAL LOW (ref 13.0–17.0)
MCH: 30.5 pg (ref 26.0–34.0)
MCH: 30.4 pg (ref 26.0–34.0)
MCHC: 32.2 g/dL (ref 30.0–36.0)
MCHC: 32.1 g/dL (ref 30.0–36.0)
MCV: 94.8 fL (ref 78.0–100.0)
MCV: 94.8 fL (ref 78.0–100.0)
Platelets: 448 10*3/uL — ABNORMAL HIGH (ref 150–400)
Platelets: 384 10*3/uL (ref 150–400)
RBC: 4.23 MIL/uL (ref 4.22–5.81)
RBC: 3.88 MIL/uL — ABNORMAL LOW (ref 4.22–5.81)
RDW: 14.4 % (ref 11.5–15.5)
RDW: 14.4 % (ref 11.5–15.5)
WBC: 9.1 10*3/uL (ref 4.0–10.5)
WBC: 7.3 10*3/uL (ref 4.0–10.5)

## 2016-09-03 LAB — COMPREHENSIVE METABOLIC PANEL
ALBUMIN: 3.8 g/dL (ref 3.5–5.0)
ALT: 13 U/L — ABNORMAL LOW (ref 17–63)
ANION GAP: 9 (ref 5–15)
AST: 13 U/L — AB (ref 15–41)
Alkaline Phosphatase: 70 U/L (ref 38–126)
BUN: 9 mg/dL (ref 6–20)
CHLORIDE: 98 mmol/L — AB (ref 101–111)
CO2: 27 mmol/L (ref 22–32)
Calcium: 9.1 mg/dL (ref 8.9–10.3)
Creatinine, Ser: 0.93 mg/dL (ref 0.61–1.24)
GFR calc Af Amer: >60 mL/min (ref >60)
GFR calc non Af Amer: >60 mL/min (ref >60)
GLUCOSE: 142 mg/dL — AB (ref 65–99)
Potassium: 4 mmol/L (ref 3.5–5.1)
SODIUM: 134 mmol/L — AB (ref 135–145)
Total Bilirubin: 0.5 mg/dL (ref 0.3–1.2)
Total Protein: 7.1 g/dL (ref 6.5–8.1)

## 2016-09-03 LAB — PROTIME-INR
INR: 1
Prothrombin Time: 13.2 seconds (ref 11.4–15.2)

## 2016-09-03 LAB — BASIC METABOLIC PANEL
Anion gap: 10 (ref 5–15)
BUN: 9 mg/dL (ref 6–20)
CO2: 26 mmol/L (ref 22–32)
CREATININE: 0.9 mg/dL (ref 0.61–1.24)
Calcium: 9.4 mg/dL (ref 8.9–10.3)
Chloride: 100 mmol/L — ABNORMAL LOW (ref 101–111)
GFR calc Af Amer: >60 mL/min (ref >60)
GFR calc non Af Amer: >60 mL/min (ref >60)
GLUCOSE: 98 mg/dL (ref 65–99)
POTASSIUM: 3.7 mmol/L (ref 3.5–5.1)
Sodium: 136 mmol/L (ref 135–145)

## 2016-09-03 LAB — SURGICAL PCR SCREEN
MRSA, PCR: NEGATIVE
STAPHYLOCOCCUS AUREUS: NEGATIVE

## 2016-09-03 SURGERY — IRRIGATION AND DEBRIDEMENT EXTREMITY
Anesthesia: General | Site: Leg Upper | Laterality: Left

## 2016-09-03 MED ORDER — EPHEDRINE SULFATE-NACL 50-0.9 MG/10ML-% IV SOSY
PREFILLED_SYRINGE | INTRAVENOUS | Status: DC | PRN
  Administered 2016-09-03: 10 mg via INTRAVENOUS

## 2016-09-03 MED ORDER — SPIRONOLACTONE 25 MG PO TABS
25.0000 mg | ORAL_TABLET | Freq: Every day | ORAL | Status: DC
  Administered 2016-09-03 – 2016-09-05 (×3): 25 mg via ORAL
  Filled 2016-09-03 (×3): qty 1

## 2016-09-03 MED ORDER — PHENYLEPHRINE 40 MCG/ML (10ML) SYRINGE FOR IV PUSH (FOR BLOOD PRESSURE SUPPORT)
PREFILLED_SYRINGE | INTRAVENOUS | Status: DC | PRN
  Administered 2016-09-03: 80 ug via INTRAVENOUS
  Administered 2016-09-03: 40 ug via INTRAVENOUS

## 2016-09-03 MED ORDER — 0.9 % SODIUM CHLORIDE (POUR BTL) OPTIME
TOPICAL | Status: DC | PRN
  Administered 2016-09-03: 1000 mL

## 2016-09-03 MED ORDER — OXYCODONE-ACETAMINOPHEN 5-325 MG PO TABS
1.0000 | ORAL_TABLET | ORAL | Status: DC | PRN
  Administered 2016-09-03 (×2): 1 via ORAL
  Administered 2016-09-04: 2 via ORAL
  Administered 2016-09-04: 1 via ORAL
  Administered 2016-09-05 (×2): 2 via ORAL
  Filled 2016-09-03: qty 1
  Filled 2016-09-03 (×2): qty 2
  Filled 2016-09-03: qty 1
  Filled 2016-09-03: qty 2

## 2016-09-03 MED ORDER — ONDANSETRON HCL 4 MG/2ML IJ SOLN
4.0000 mg | Freq: Four times a day (QID) | INTRAMUSCULAR | Status: DC | PRN
  Administered 2016-09-03: 4 mg via INTRAVENOUS
  Filled 2016-09-03: qty 2

## 2016-09-03 MED ORDER — PROPOFOL 10 MG/ML IV BOLUS
INTRAVENOUS | Status: DC | PRN
  Administered 2016-09-03: 170 mg via INTRAVENOUS

## 2016-09-03 MED ORDER — OXYCODONE-ACETAMINOPHEN 5-325 MG PO TABS
ORAL_TABLET | ORAL | Status: AC
  Administered 2016-09-03: 1 via ORAL
  Filled 2016-09-03: qty 1

## 2016-09-03 MED ORDER — BISACODYL 5 MG PO TBEC: 5.0000 mg | DELAYED_RELEASE_TABLET | Freq: Every day | ORAL | Status: DC | PRN

## 2016-09-03 MED ORDER — AMLODIPINE BESYLATE 10 MG PO TABS
10.0000 mg | ORAL_TABLET | Freq: Every day | ORAL | Status: DC
  Administered 2016-09-04 – 2016-09-05 (×2): 10 mg via ORAL
  Filled 2016-09-03 (×2): qty 1

## 2016-09-03 MED ORDER — MORPHINE SULFATE (PF) 2 MG/ML IV SOLN
2.0000 mg | INTRAVENOUS | Status: DC | PRN
  Administered 2016-09-03 (×2): 4 mg via INTRAVENOUS
  Administered 2016-09-04 – 2016-09-05 (×7): 2 mg via INTRAVENOUS
  Filled 2016-09-03 (×2): qty 1
  Filled 2016-09-03: qty 3
  Filled 2016-09-03: qty 2
  Filled 2016-09-03 (×5): qty 1

## 2016-09-03 MED ORDER — MIRTAZAPINE 15 MG PO TABS
7.5000 mg | ORAL_TABLET | Freq: Every day | ORAL | Status: DC
  Administered 2016-09-03 – 2016-09-04 (×2): 7.5 mg via ORAL
  Filled 2016-09-03 (×2): qty 1

## 2016-09-03 MED ORDER — DOCUSATE SODIUM 100 MG PO CAPS
100.0000 mg | ORAL_CAPSULE | Freq: Every day | ORAL | Status: DC
  Administered 2016-09-03 – 2016-09-05 (×3): 100 mg via ORAL
  Filled 2016-09-03 (×3): qty 1

## 2016-09-03 MED ORDER — ALUM & MAG HYDROXIDE-SIMETH 200-200-20 MG/5ML PO SUSP: 15.0000 mL | ORAL | Status: DC | PRN

## 2016-09-03 MED ORDER — PANTOPRAZOLE SODIUM 40 MG PO TBEC
40.0000 mg | DELAYED_RELEASE_TABLET | Freq: Every day | ORAL | Status: DC
Start: 2016-09-03 — End: 2016-09-05
  Administered 2016-09-03 – 2016-09-05 (×3): 40 mg via ORAL
  Filled 2016-09-03 (×3): qty 1

## 2016-09-03 MED ORDER — ONDANSETRON HCL 4 MG/2ML IJ SOLN
INTRAMUSCULAR | Status: DC | PRN
  Administered 2016-09-03: 4 mg via INTRAVENOUS

## 2016-09-03 MED ORDER — POTASSIUM CHLORIDE CRYS ER 20 MEQ PO TBCR
20.0000 meq | EXTENDED_RELEASE_TABLET | Freq: Once | ORAL | Status: AC
  Administered 2016-09-03: 20 meq via ORAL
  Filled 2016-09-03: qty 1

## 2016-09-03 MED ORDER — ATORVASTATIN CALCIUM 40 MG PO TABS
40.0000 mg | ORAL_TABLET | Freq: Every day | ORAL | Status: DC
  Administered 2016-09-03 – 2016-09-04 (×2): 40 mg via ORAL
  Filled 2016-09-03 (×2): qty 1

## 2016-09-03 MED ORDER — CLOPIDOGREL BISULFATE 75 MG PO TABS
75.0000 mg | ORAL_TABLET | Freq: Every day | ORAL | Status: DC
  Administered 2016-09-04 – 2016-09-05 (×2): 75 mg via ORAL
  Filled 2016-09-03 (×2): qty 1

## 2016-09-03 MED ORDER — METOPROLOL TARTRATE 25 MG PO TABS
125.0000 mg | ORAL_TABLET | Freq: Two times a day (BID) | ORAL | Status: DC
  Administered 2016-09-03 – 2016-09-05 (×5): 125 mg via ORAL
  Filled 2016-09-03 (×10): qty 1

## 2016-09-03 MED ORDER — MUPIROCIN 2 % EX OINT
1.0000 "application " | TOPICAL_OINTMENT | Freq: Once | CUTANEOUS | Status: AC
  Administered 2016-09-03: 1 via TOPICAL

## 2016-09-03 MED ORDER — MIDAZOLAM HCL 2 MG/2ML IJ SOLN
INTRAMUSCULAR | Status: AC
  Filled 2016-09-03: qty 2

## 2016-09-03 MED ORDER — ACETAMINOPHEN 325 MG PO TABS: 325.0000 mg | ORAL_TABLET | ORAL | Status: DC | PRN

## 2016-09-03 MED ORDER — GUAIFENESIN-DM 100-10 MG/5ML PO SYRP: 15.0000 mL | ORAL_SOLUTION | ORAL | Status: DC | PRN

## 2016-09-03 MED ORDER — METOPROLOL TARTRATE 25 MG PO TABS
125.0000 mg | ORAL_TABLET | Freq: Once | ORAL | Status: AC
  Administered 2016-09-03: 125 mg via ORAL
  Filled 2016-09-03: qty 1

## 2016-09-03 MED ORDER — HYDROMORPHONE HCL 1 MG/ML IJ SOLN
INTRAMUSCULAR | Status: AC
  Administered 2016-09-03: 0.25 mg via INTRAVENOUS
  Filled 2016-09-03: qty 0.5

## 2016-09-03 MED ORDER — ASPIRIN EC 81 MG PO TBEC
81.0000 mg | DELAYED_RELEASE_TABLET | Freq: Every day | ORAL | Status: DC
  Administered 2016-09-04 – 2016-09-05 (×2): 81 mg via ORAL
  Filled 2016-09-03 (×2): qty 1

## 2016-09-03 MED ORDER — LABETALOL HCL 5 MG/ML IV SOLN
INTRAVENOUS | Status: AC
  Administered 2016-09-03: 10 mg via INTRAVENOUS
  Filled 2016-09-03: qty 4

## 2016-09-03 MED ORDER — METOPROLOL TARTRATE 5 MG/5ML IV SOLN: 2.0000 mg | INTRAVENOUS | Status: DC | PRN

## 2016-09-03 MED ORDER — METOPROLOL TARTRATE 50 MG PO TABS
ORAL_TABLET | ORAL | Status: AC
  Filled 2016-09-03: qty 2

## 2016-09-03 MED ORDER — VITAMIN B-1 100 MG PO TABS
100.0000 mg | ORAL_TABLET | Freq: Every day | ORAL | Status: DC
  Administered 2016-09-03 – 2016-09-05 (×3): 100 mg via ORAL
  Filled 2016-09-03 (×3): qty 1

## 2016-09-03 MED ORDER — HYDROMORPHONE HCL 1 MG/ML IJ SOLN
0.2500 mg | INTRAMUSCULAR | Status: DC | PRN
  Administered 2016-09-03 (×2): 0.25 mg via INTRAVENOUS

## 2016-09-03 MED ORDER — FERROUS SULFATE 325 (65 FE) MG PO TABS
325.0000 mg | ORAL_TABLET | Freq: Every day | ORAL | Status: DC
  Administered 2016-09-04 – 2016-09-05 (×2): 325 mg via ORAL
  Filled 2016-09-03 (×2): qty 1

## 2016-09-03 MED ORDER — HYDRALAZINE HCL 20 MG/ML IJ SOLN: 5.0000 mg | INTRAMUSCULAR | Status: DC | PRN

## 2016-09-03 MED ORDER — PHENOL 1.4 % MT LIQD
1.0000 | OROMUCOSAL | Status: DC | PRN
Start: 2016-09-03 — End: 2016-09-05

## 2016-09-03 MED ORDER — SODIUM CHLORIDE 0.9 % IV SOLN
INTRAVENOUS | Status: DC
  Administered 2016-09-03 – 2016-09-04 (×2): via INTRAVENOUS

## 2016-09-03 MED ORDER — FENTANYL CITRATE (PF) 100 MCG/2ML IJ SOLN
INTRAMUSCULAR | Status: AC
  Filled 2016-09-03: qty 2

## 2016-09-03 MED ORDER — MUPIROCIN 2 % EX OINT
TOPICAL_OINTMENT | CUTANEOUS | Status: AC
  Administered 2016-09-03: 15:00:00
  Filled 2016-09-03: qty 22

## 2016-09-03 MED ORDER — LIDOCAINE HCL (CARDIAC) 20 MG/ML IV SOLN
INTRAVENOUS | Status: DC | PRN
  Administered 2016-09-03: 100 mg via INTRATRACHEAL

## 2016-09-03 MED ORDER — METOPROLOL TARTRATE 12.5 MG HALF TABLET
ORAL_TABLET | ORAL | Status: AC
Start: 2016-09-03 — End: 2016-09-03
  Filled 2016-09-03: qty 2

## 2016-09-03 MED ORDER — PHENYLEPHRINE HCL 10 MG/ML IJ SOLN: INTRAVENOUS | Status: DC | PRN

## 2016-09-03 MED ORDER — PROPOFOL 10 MG/ML IV BOLUS
INTRAVENOUS | Status: AC
  Filled 2016-09-03: qty 20

## 2016-09-03 MED ORDER — FENTANYL CITRATE (PF) 100 MCG/2ML IJ SOLN
INTRAMUSCULAR | Status: DC | PRN
  Administered 2016-09-03: 25 ug via INTRAVENOUS
  Administered 2016-09-03: 50 ug via INTRAVENOUS

## 2016-09-03 MED ORDER — ACETAMINOPHEN 325 MG RE SUPP: 325.0000 mg | RECTAL | Status: DC | PRN

## 2016-09-03 MED ORDER — LACTULOSE 10 GM/15ML PO SOLN
20.0000 g | Freq: Two times a day (BID) | ORAL | Status: DC
  Administered 2016-09-03 – 2016-09-05 (×3): 20 g via ORAL
  Filled 2016-09-03 (×5): qty 30

## 2016-09-03 MED ORDER — LACTATED RINGERS IV SOLN
INTRAVENOUS | Status: DC
  Administered 2016-09-03: 10:00:00 via INTRAVENOUS

## 2016-09-03 MED ORDER — LABETALOL HCL 5 MG/ML IV SOLN
10.0000 mg | INTRAVENOUS | Status: DC | PRN
  Administered 2016-09-03 (×2): 10 mg via INTRAVENOUS
  Filled 2016-09-03 (×2): qty 4

## 2016-09-03 MED ORDER — HEPARIN SODIUM (PORCINE) 5000 UNIT/ML IJ SOLN
5000.0000 [IU] | Freq: Three times a day (TID) | INTRAMUSCULAR | Status: DC
  Administered 2016-09-04 – 2016-09-05 (×3): 5000 [IU] via SUBCUTANEOUS
  Filled 2016-09-03 (×4): qty 1

## 2016-09-03 SURGICAL SUPPLY — 31 items
CANISTER SUCT 3000ML PPV (MISCELLANEOUS) ×2 IMPLANT
CANISTER WOUND CARE 500ML ATS (WOUND CARE) ×2 IMPLANT
COVER SURGICAL LIGHT HANDLE (MISCELLANEOUS) ×2 IMPLANT
DRAPE ORTHO SPLIT 77X108 STRL (DRAPES) ×2
DRAPE PROXIMA HALF (DRAPES) ×2 IMPLANT
DRAPE SURG ORHT 6 SPLT 77X108 (DRAPES) ×2 IMPLANT
DRSG VAC ATS SM SENSATRAC (GAUZE/BANDAGES/DRESSINGS) ×2 IMPLANT
ELECT REM PT RETURN 9FT ADLT (ELECTROSURGICAL) ×2
ELECTRODE REM PT RTRN 9FT ADLT (ELECTROSURGICAL) ×1 IMPLANT
GLOVE BIO SURGEON STRL SZ7.5 (GLOVE) ×2 IMPLANT
GLOVE BIOGEL PI IND STRL 6.5 (GLOVE) ×1 IMPLANT
GLOVE BIOGEL PI IND STRL 7.0 (GLOVE) ×1 IMPLANT
GLOVE BIOGEL PI IND STRL 7.5 (GLOVE) ×1 IMPLANT
GLOVE BIOGEL PI INDICATOR 6.5 (GLOVE) ×1
GLOVE BIOGEL PI INDICATOR 7.0 (GLOVE) ×1
GLOVE BIOGEL PI INDICATOR 7.5 (GLOVE) ×1
GLOVE ECLIPSE 7.0 STRL STRAW (GLOVE) ×2 IMPLANT
GLOVE SURG SS PI 6.5 STRL IVOR (GLOVE) ×2 IMPLANT
GOWN STRL REUS W/ TWL LRG LVL3 (GOWN DISPOSABLE) ×2 IMPLANT
GOWN STRL REUS W/ TWL XL LVL3 (GOWN DISPOSABLE) ×1 IMPLANT
GOWN STRL REUS W/TWL LRG LVL3 (GOWN DISPOSABLE) ×2
GOWN STRL REUS W/TWL XL LVL3 (GOWN DISPOSABLE) ×1
KIT BASIN OR (CUSTOM PROCEDURE TRAY) ×2 IMPLANT
KIT ROOM TURNOVER OR (KITS) ×2 IMPLANT
NS IRRIG 1000ML POUR BTL (IV SOLUTION) ×2 IMPLANT
PACK GENERAL/GYN (CUSTOM PROCEDURE TRAY) ×2 IMPLANT
PAD ARMBOARD 7.5X6 YLW CONV (MISCELLANEOUS) ×4 IMPLANT
SPONGE GAUZE 4X4 12PLY STER LF (GAUZE/BANDAGES/DRESSINGS) ×2 IMPLANT
TOWEL OR 17X24 6PK STRL BLUE (TOWEL DISPOSABLE) ×2 IMPLANT
TOWEL OR 17X26 10 PK STRL BLUE (TOWEL DISPOSABLE) ×2 IMPLANT
WATER STERILE IRR 1000ML POUR (IV SOLUTION) ×2 IMPLANT

## 2016-09-03 NOTE — Anesthesia Procedure Notes (Signed)
Procedure Name: LMA Insertion Date/Time: 09/03/2016 1:05 PM Performed by: Burt EkURNER, Kinslea Frances ASHLEY Pre-anesthesia Checklist: Patient identified, Emergency Drugs available, Suction available and Patient being monitored Patient Re-evaluated:Patient Re-evaluated prior to inductionOxygen Delivery Method: Circle system utilized Preoxygenation: Pre-oxygenation with 100% oxygen Intubation Type: IV induction Ventilation: Mask ventilation without difficulty LMA: LMA inserted LMA Size: 5.0 Number of attempts: 1 Placement Confirmation: positive ETCO2 and breath sounds checked- equal and bilateral Tube secured with: Tape Dental Injury: Teeth and Oropharynx as per pre-operative assessment

## 2016-09-03 NOTE — Progress Notes (Signed)
[facility] [place] [facility] [facility] to verify dose of Lopressor. States that yes he takes lopressor 125mg  twice a day. Earle in Pharmacy informed.

## 2016-09-03 NOTE — H&P (Signed)
HP   History of Present Illness: This is a 61 y.o. 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 Has his RLE revascularized now with wound of aka stump.  Past Medical History:  Diagnosis Date  . HTN (hypertension)   . Non-healing surgical wound    left AKA  . Noncompliance   . PAD (peripheral artery disease) (HCC)    a.  s/p left femoral to PT artery bypass with propaten on 02/16/16.    Past Surgical History:  Procedure Laterality Date  . ABDOMINAL AORTOGRAM W/LOWER EXTREMITY N/A 08/02/2016   Procedure: Abdominal Aortogram w/Lower Extremity;  Surgeon: Maeola Harman, MD;  Location: [facility] INVASIVE CV LAB;  Service: Cardiovascular;  Laterality: N/A;  . AMPUTATION Left 06/25/2016   Procedure: AMPUTATION ABOVE KNEE;  Surgeon: Nada Libman, MD;  Location: [facility] OR;  Service: Vascular;  Laterality: Left;  . BYPASS GRAFT POPLITEAL TO TIBIAL Left 06/21/2016   Procedure: BYPASS GRAFT POPLITEAL TO TIBIAL USING GORE PROPATEN GRAFT;  Surgeon: Maeola Harman, MD;  Location: [facility] OR;  Service: Vascular;  Laterality: Left;  . EMBOLECTOMY Left 06/21/2016   Procedure: EMBOLECTOMY left lower extremity.;  Surgeon: Maeola Harman, MD;  Location: [facility] OR;  Service: Vascular;  Laterality: Left;  . ENDARTERECTOMY FEMORAL Left 02/16/2016   Procedure: LEFT FEMORAL ENDARTERECTOMY;  Surgeon: Maeola Harman, MD;  Location: [facility] OR;  Service: Vascular;  Laterality: Left;  . FEMORAL BYPASS  02/16/2016   LEFT FEMORAL-POSTERIOR TIBIAL ARTERY BYPASS  WITH PROPATEN 6 MM X 80 CM VASCULAR RING GRAFT (Left)  . FEMORAL-TIBIAL BYPASS GRAFT Left 02/16/2016   Procedure: LEFT FEMORAL-POSTERIOR TIBIAL ARTERY BYPASS  WITH PROPATEN 6 MM X 80 CM VASCULAR RING GRAFT;  Surgeon: Maeola Harman, MD;  Location: [facility] OR;  Service: Vascular;  Laterality: Left;  . PATCH ANGIOPLASTY Left 02/16/2016   Procedure: PATCH ANGIOPLASTY WITH Livia Snellen BIOLOGIC PATCH 1 CM X 6 CM;  Surgeon: Maeola Harman, MD;  Location: [facility] OR;  Service: Vascular;  Laterality: Left;  . PERIPHERAL VASCULAR BALLOON ANGIOPLASTY Right 08/02/2016   Procedure: Peripheral Vascular Balloon Angioplasty;  Surgeon: Maeola Harman, MD;  Location: [facility] INVASIVE CV LAB;  Service: Cardiovascular;  Laterality: Right;  peroneal artery  . PERIPHERAL VASCULAR CATHETERIZATION N/A 07/07/2015   Procedure: Abdominal Aortogram w/Lower Extremity;  Surgeon: Fransisco Hertz, MD;  Location: [facility] INVASIVE CV LAB;  Service: Cardiovascular;  Laterality: N/A;  . PERIPHERAL VASCULAR CATHETERIZATION N/A 02/15/2016   Procedure: Abdominal Aortogram w/Lower Extremity;  Surgeon: Maeola Harman, MD;  Location: [facility] INVASIVE CV LAB;  Service: Cardiovascular;  Laterality: N/A;  . PERIPHERAL VASCULAR CATHETERIZATION Bilateral 06/20/2016   Procedure: Lower Extremity Angiography;  Surgeon: Maeola Harman, MD;  Location: [facility] INVASIVE CV LAB;  Service: Cardiovascular;  Laterality: Bilateral;  limited runoff on rt leg thrombolysis lt leg bypass graft  . PERIPHERAL VASCULAR INTERVENTION Right 08/02/2016   Procedure: Peripheral Vascular Intervention;  Surgeon: Maeola Harman, MD;  Location: [facility] INVASIVE CV LAB;  Service: Cardiovascular;  Laterality: Right;  Femoral , popliteal  . 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    No Known Allergies  Prior to Admission medications   Medication Sig Start Date End Date Taking? Authorizing Provider  amLODipine (NORVASC) 10 MG tablet Take 1 tablet (10 mg total) by mouth daily. 07/07/16  Yes Samantha J Rhyne, PA-C  aspirin EC 81 MG EC tablet Take 1 tablet (81  mg total) by mouth daily. 07/07/16  Yes Samantha J Rhyne, PA-C  atorvastatin (LIPITOR) 40 MG tablet Take 1 tablet (40 mg total) by mouth daily at 6 PM. 02/18/16  Yes Mir Vergie LivingMohammed Ikramullah, MD  bisacodyl (DULCOLAX) 5 MG EC tablet Take 1 tablet (5 mg total) by mouth daily as needed for  moderate constipation. 02/18/16  Yes Mir Vergie LivingMohammed Ikramullah, MD  clopidogrel (PLAVIX) 75 MG tablet Take 1 tablet (75 mg total) by mouth daily. 07/07/16  Yes Samantha J Rhyne, PA-C  docusate sodium (COLACE) 100 MG capsule Take 1 capsule (100 mg total) by mouth daily. 02/18/16  Yes Mir Vergie LivingMohammed Ikramullah, MD  ferrous sulfate 325 (65 FE) MG tablet Take 325 mg by mouth daily with breakfast.   Yes Historical Provider, MD  lactulose (CHRONULAC) 10 GM/15ML solution Take 30 mLs (20 g total) by mouth 2 (two) times daily. 08/03/16  Yes Maryruth Bunhristina P Rama, MD  metoprolol tartrate (LOPRESSOR) 25 MG tablet Take 5 tablets (125 mg total) by mouth 2 (two) times daily. 07/06/16  Yes Samantha J Rhyne, PA-C  mirtazapine (REMERON) 7.5 MG tablet Take 7.5 mg by mouth at bedtime.   Yes Historical Provider, MD  omeprazole (PRILOSEC) 20 MG capsule Take 20 mg by mouth 2 (two) times daily before a meal.   Yes Historical Provider, MD  spironolactone (ALDACTONE) 25 MG tablet Take 1 tablet (25 mg total) by mouth daily. 07/07/16  Yes Samantha J Rhyne, PA-C  thiamine 100 MG tablet Take 1 tablet (100 mg total) by mouth daily. 07/07/16  Yes Samantha J Rhyne, PA-C  folic acid (FOLVITE) 1 MG tablet Take 1 tablet (1 mg total) by mouth daily. Patient not taking: Reported on 09/03/2016 07/07/16   Samantha J Rhyne, PA-C  nicotine (NICODERM CQ - DOSED IN MG/24 HOURS) 21 mg/24hr patch Place 1 patch (21 mg total) onto the skin daily. Patient not taking: Reported on 09/03/2016 08/04/16   Maryruth Bunhristina P Rama, MD  selenium sulfide (SELSUN) 1 % LOTN Put on your face in the shower and wash off daily Patient not taking: Reported on 09/03/2016 08/31/15   Selina CooleyKyle Flores, MD    Social History   Social History  . Marital status: Single    Spouse name: N/A  . Number of children: N/A  . Years of education: N/A   Occupational History  . Unemployed    Social History Main Topics  . Smoking status: Former Smoker    Packs/day: 3.00    Years: 40.00  . Smokeless  tobacco: Never Used     Comment: cutting back amount he is buying  . Alcohol use 8.4 oz/week    14 Cans of beer per week     Comment: Drinks a 40 oz "tallboy" daily  . Drug use: No  . Sexual activity: Yes    Birth control/ protection: Condom   Other Topics Concern  . Not on file   Social History Narrative   Smoking cigarettes since 61 y.o   Drinks beer 24 oz every other day         Family History  Problem Relation Age of Onset  . Cancer Mother     deceased in 6970s   . Heart attack Mother     diseased  . Heart disease Father   . Stroke Sister     age 9150s  . Heart disease Brother     pacemaker in his 6050s     Physical Examination  Vitals:   09/03/16 0931  BP: Marland Kitchen(!)  153/89  Pulse: (!) 115  Resp: 18  Temp: 97.9 F (36.6 C)   Body mass index is 14.44 kg/m.  General:  WDWN in NAD Gait: Not observed HENT: WNL, normocephalic Pulmonary: normal non-labored breathing, without Rales, rhonchi,  wheezing Cardiac: rrr Abdomen: soft Extremities: dressing is cdi Musculoskeletal: no muscle wasting or atrophy  Neurologic: A&O X 3; Appropriate Affect ; SENSATION: normal; MOTOR FUNCTION:  moving all extremities equally. Speech is fluent/normal   CBC    Component Value Date/Time   WBC 9.1 09/03/2016 0956   RBC 4.23 09/03/2016 0956   HGB 12.9 (L) 09/03/2016 0956   HCT 40.1 09/03/2016 0956   PLT 448 (H) 09/03/2016 0956   MCV 94.8 09/03/2016 0956   MCH 30.5 09/03/2016 0956   MCHC 32.2 09/03/2016 0956   RDW 14.4 09/03/2016 0956   LYMPHSABS 1.8 08/01/2016 0213   MONOABS 0.5 08/01/2016 0213   EOSABS 0.2 08/01/2016 0213   BASOSABS 0.0 08/01/2016 0213    BMET    Component Value Date/Time   NA 136 09/03/2016 0956   K 3.7 09/03/2016 0956   CL 100 (L) 09/03/2016 0956   CO2 26 09/03/2016 0956   GLUCOSE 98 09/03/2016 0956   BUN 9 09/03/2016 0956   CREATININE 0.90 09/03/2016 0956   CREATININE 0.92 11/13/2013 1031   CALCIUM 9.4 09/03/2016 0956   GFRNONAA >60 09/03/2016  0956   GFRNONAA >89 11/13/2013 1031   GFRAA >60 09/03/2016 0956   GFRAA >89 11/13/2013 1031    COAGS: Lab Results  Component Value Date   INR 1.21 08/01/2016   INR 1.13 07/31/2016   INR 1.07 06/25/2016     ASSESSMENT/PLAN: This is a 61 y.o. male here with breakdown of his left aka site. Plan for debridement today with likely placement of wound vac.   Octavio Matheney C. Randie Heinz, MD [facility] [place] Office: [phone] Pager: [phone]

## 2016-09-03 NOTE — Op Note (Signed)
    Patient name: Stephen Bowen MRN: [id number] DOB: 04-26-1956 Sex: male  09/03/2016 Pre-operative Diagnosis: left above knee amputation site wound Post-operative diagnosis:  Same Surgeon:  Eda Paschal. Donzetta Matters, MD Procedure Performed: 1.  Debridement of left aka site to 8 x 3 x 1cm  2.  Wound vac placement  Indications:  61 year old male underwent left lower extremity bypass procedure that failed. He certainly had above-the-knee amputation in January of this year. He now has a wound that leg and is indicated for debridement.  Findings: The skin and subcutaneous tissue were debrided with scissors and electrocautery to a size of 8 x 3 x 1 cm. At completion there was healthy bleeding from all wound sites and wound VAC was placed.   Procedure:  The patient was identified in the holding area and taken to the operating room where Mac anesthesia was induced. He was prepped and draped in his left residual limb site and timeout called. Antibiotics were administered. We began by opening a skin bridge between 2 wounds where there was drainage that appeared to be fat necrosis. The tissue was then debrided with scissors and electrocautery until healthy tissue was encountered. This measured 8 x 3 x 1 cm. A wound VAC was then placed patient is laterally from anesthesia having tolerated the procedure well without immediate complication.   Brandon C. Donzetta Matters, MD [facility] [place] Office: [phone] Pager: [phone]

## 2016-09-03 NOTE — Anesthesia Preprocedure Evaluation (Addendum)
Anesthesia Evaluation  Patient identified by MRN, date of birth, ID band  Reviewed: Allergy & Precautions, NPO status , Patient's Chart, lab work & pertinent test results  Airway Mallampati: II  TM Distance: >3 FB     Dental  (+) Missing, Loose, Dental Advisory Given   Pulmonary Current Smoker, former smoker,    breath sounds clear to auscultation       Cardiovascular hypertension, + CAD, + Past MI and + Peripheral Vascular Disease   Rhythm:Regular Rate:Normal     Neuro/Psych    GI/Hepatic Neg liver ROS, GERD  ,  Endo/Other    Renal/GU Renal disease     Musculoskeletal   Abdominal   Peds  Hematology   Anesthesia Other Findings   Reproductive/Obstetrics                            Anesthesia Physical Anesthesia Plan  ASA: III  Anesthesia Plan: General   Post-op Pain Management:    Induction: Intravenous  Airway Management Planned: Oral ETT  Additional Equipment:   Intra-op Plan:   Post-operative Plan: Extubation in OR  Informed Consent: I have reviewed the patients History and Physical, chart, labs and discussed the procedure including the risks, benefits and alternatives for the proposed anesthesia with the patient or authorized representative who has indicated his/her understanding and acceptance.   Dental advisory given  Plan Discussed with: CRNA and Anesthesiologist  Anesthesia Plan Comments:         Anesthesia Quick Evaluation

## 2016-09-03 NOTE — Progress Notes (Addendum)
[facility] spoke with QuincyHolly. States Mr Jenne CampusMcQueen is confused most of the time. Asked Mr Jenne CampusMcQueen  if he could tell me his birthday, was able to tell me the year only. Confused to place, time, Situation, orient to person only. verified  with Jeanice LimHolly that Mr Jenne CampusMcQueen has not taken him Lopressor.

## 2016-09-03 NOTE — Transfer of Care (Signed)
Immediate Anesthesia Transfer of Care Note  Patient: Stephen Bowen  Procedure(s) Performed: Procedure(s): IRRIGATION AND DEBRIDEMENT EXTREMITY - LEFT AKA (Left)  Patient Location: PACU  Anesthesia Type:General  Level of Consciousness: awake, alert , oriented and patient cooperative  Airway & Oxygen Therapy: Patient Spontanous Breathing and Patient connected to face mask oxygen  Post-op Assessment: Report given to RN, Post -op Vital signs reviewed and stable and Patient moving all extremities X 4  Post vital signs: Reviewed and stable  Last Vitals:  Vitals:   09/03/16 0931  BP: (!) 153/89  Pulse: (!) 115  Resp: 18  Temp: 36.6 C    Last Pain:  Vitals:   09/03/16 0931  TempSrc: Oral      Patients Stated Pain Goal:  (unable to state pain level ) (09/03/16 1007)  Complications: No apparent anesthesia complications

## 2016-09-04 ENCOUNTER — Encounter (HOSPITAL_COMMUNITY): Payer: Self-pay | Admitting: Vascular Surgery

## 2016-09-04 LAB — HIV ANTIBODY (ROUTINE TESTING W REFLEX): HIV SCREEN 4TH GENERATION: NONREACTIVE

## 2016-09-04 NOTE — Progress Notes (Signed)
Clinical Social Worker has reached out to [facility] came from. Admissions Coordinator Shontell stated facility is able to take patient back on LOG. Facility made aware that patient will need a wound vac at facility. Patient will be discharging with a 2 week LOG covering room and board at facility.  Marrianne MoodAshley Suleima Ohlendorf, MSW,  [employer] [phone]

## 2016-09-04 NOTE — Care Management Note (Signed)
Case Management Note Donn PieriniKristi Gabbriella Presswood RN, BSN Unit 2W-Case Manager [phone]  Patient Details  Name: Stephen ApleyJerome D Bowen MRN: [id number] Date of Birth: December 08, 1955  Subjective/Objective:   Pt admitted s/p I&D of AKA stump with wound VAC placement                 Action/Plan: PTA [facility]- per CSW pt will be approved for 2 more weeks under LOG- pt will need wound VAC at SNF- per SNF they use [facility]- [facility] has contract with [facility] under [employer] and should be able to provide pt with [facility] wound VAC on return to the facility. CM will assist in getting transport [facility] VAC for return to SNF on discharge.   Expected Discharge Date:                  Expected Discharge Plan:  Skilled Nursing Facility  In-House Referral:  Clinical Social Work  Discharge planning Services  CM Consult  Post Acute Care Choice:  Durable Medical Equipment Choice offered to:     DME Arranged:  Vac DME Agency:  [facility]  HH Arranged:  NA HH Agency:     Status of Service:  Completed, signed off  If discussed at [employer] Meetings, dates discussed:    Additional Comments:  Darrold SpanWebster, Julicia Krieger Hall, RN 09/04/2016, 3:08 PM

## 2016-09-04 NOTE — Progress Notes (Addendum)
[facility] [place]  Subjective  -    Objective 124/71 72 98.7 F (37.1 C) (Oral) 18 100%  Intake/Output Summary (Last 24 hours) at 09/04/16 0732 Last data filed at 09/04/16 0400  Gross per 24 hour  Intake          1909.17 ml  Output              410 ml  Net          1499.17 ml    Left residual limb wound vac in place Left limb warm, no erythema    Assessment/Planning: POD #1 Debridement of left aka site to 8 x 3 x 1cm  Afebrile, WBC 7.3 No antibiotics needed. D/C to SNF with wound vac changes M-W-F F/U with Dr. Randie Heinzain in 2 weeks We will change the wound vac Wed. And then D/C back to SNF .   Clinton GallantCOLLINS, EMMA [facility] 09/04/2016 7:32 AM --  Laboratory Lab Results:  Recent Labs  09/03/16 0956 09/03/16 1621  WBC 9.1 7.3  HGB 12.9* 11.8*  HCT 40.1 36.8*  PLT 448* 384   BMET  Recent Labs  09/03/16 0956 09/03/16 1621  NA 136 134*  K 3.7 4.0  CL 100* 98*  CO2 26 27  GLUCOSE 98 142*  BUN 9 9  CREATININE 0.90 0.93  CALCIUM 9.4 9.1    COAG Lab Results  Component Value Date   INR 1.00 09/03/2016   INR 1.21 08/01/2016   INR 1.13 07/31/2016   No results found for: PTT  I have independently interviewed patient and agree with PA assessment and plan above.   Imane Burrough C. Randie Heinzain, MD [facility] [place] Office: [phone] Pager: [phone]

## 2016-09-05 ENCOUNTER — Telehealth: Payer: Self-pay | Admitting: Vascular Surgery

## 2016-09-05 MED ORDER — OXYCODONE-ACETAMINOPHEN 5-325 MG PO TABS: 1.0000 | ORAL_TABLET | ORAL | 0 refills | Status: DC | PRN

## 2016-09-05 NOTE — Progress Notes (Addendum)
  [facility] Progress Note  Subjective  - POD #2  Anxious about wound vac change  Objective Vitals:   09/04/16 2054 09/05/16 0357  BP: 104/61 125/74  Pulse: 81 78  Resp: 18 18  Temp: 99 F (37.2 C) 98.1 F (36.7 C)    Intake/Output Summary (Last 24 hours) at 09/05/16 0809 Last data filed at 09/04/16 2245  Gross per 24 hour  Intake              360 ml  Output             2000 ml  Net            -1640 ml   Left AKA wound is clean with mostly granulation tissue and some necrotic fat  Assessment/Planning: 61 y.o. male is s/p: debridement left AKA 2 Days Post-Op   Continue wound VAC to left AKA Will d/c to SNF today.  F/u in 2 weeks for wound check.   Raymond GurneyKimberly A Trinh 09/05/2016 8:09 AM --  Laboratory CBC    Component Value Date/Time   WBC 7.3 09/03/2016 1621   HGB 11.8 (L) 09/03/2016 1621   HCT 36.8 (L) 09/03/2016 1621   PLT 384 09/03/2016 1621    BMET    Component Value Date/Time   NA 134 (L) 09/03/2016 1621   K 4.0 09/03/2016 1621   CL 98 (L) 09/03/2016 1621   CO2 27 09/03/2016 1621   GLUCOSE 142 (H) 09/03/2016 1621   BUN 9 09/03/2016 1621   CREATININE 0.93 09/03/2016 1621   CREATININE 0.92 11/13/2013 1031   CALCIUM 9.1 09/03/2016 1621   GFRNONAA >60 09/03/2016 1621   GFRNONAA >89 11/13/2013 1031   GFRAA >60 09/03/2016 1621   GFRAA >89 11/13/2013 1031    COAG Lab Results  Component Value Date   INR 1.00 09/03/2016   INR 1.21 08/01/2016   INR 1.13 07/31/2016   No results found for: PTT  Antibiotics Anti-infectives    None       Maris BergerKimberly Trinh, PA-C [facility] Office: [phone] Pager: [phone] 09/05/2016 8:09 AM   I have independently interviewed patient and agree with PA assessment and plan above. F/u in 2 weeks for wound check.  Jerran Tappan C. Randie Heinzain, MD [facility] [place] Office: [phone] Pager: [phone]

## 2016-09-05 NOTE — Telephone Encounter (Signed)
Left message at rehab to secure transportation for patient's post op appointment which is scheduled for 09/28/16 @ 8:45am. Also mailed appointment reminder to patient.

## 2016-09-05 NOTE — NC FL2 (Signed)
[place] MEDICAID FL2 LEVEL OF CARE SCREENING TOOL     IDENTIFICATION  Patient Name: Stephen Bowen Birthdate: 1955/12/26 Sex: male Admission Date (Current Location): 09/03/2016  [facility] [place] Number:  Producer, television/film/video and Address:  [facility], [address] [place], [place] [address]      Provider Number: [id number]  Attending Physician Name and Address:  Maeola Harman*  Relative Name and Phone Number:       Current Level of Care: SNF Recommended Level of Care: Skilled Nursing Facility Prior Approval Number:    Date Approved/Denied:   PASRR Number:    Discharge Plan: Home    Current Diagnoses: Patient Active Problem List   Diagnosis Date Noted  . Wound of left leg 09/03/2016  . 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 Aortic atherosclerosis (HCC) 08/01/2016  . Coronary atherosclerosis of native coronary artery 08/01/2016  . Underweight 08/01/2016  . AKI (acute kidney injury) (HCC) 07/30/2016  . Limb ischemia 07/30/2016  . HLD (hyperlipidemia) 07/30/2016  . GERD (gastroesophageal reflux disease) 07/30/2016  . Acute encephalopathy 07/30/2016  . Abdominal pain 07/30/2016  . Pressure injury of skin 06/28/2016  . 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 Aphasia   . Hypotension 06/21/2016  . Stroke (HCC) 06/21/2016  . Acute MI, anterolateral wall, initial episode of care (HCC)   . 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 Preoperative cardiovascular examination   . PAD (peripheral artery disease) (HCC) 02/13/2016  . Leg pain 02/13/2016  . Hyperkalemia 02/13/2016  . Lactic acid increased 02/13/2016  . Essential hypertension 02/13/2016  . Seborrheic dermatitis 08/31/2015  . Actinic keratosis of multiple sites of head and neck 08/31/2015  . Tobacco use disorder 08/31/2015  . Skin ulcer of scalp (HCC) 08/31/2015  . Atherosclerosis of native arteries of  extremity with intermittent claudication (HCC) 07/07/2015  . Abnormal EKG 11/13/2013  . Hypertensive urgency 11/13/2013    Orientation RESPIRATION BLADDER Height & Weight     Self  Normal Incontinent Weight: 95 lb (43.1 kg) Height:  5\' 8"  (172.7 cm)  BEHAVIORAL SYMPTOMS/MOOD NEUROLOGICAL BOWEL NUTRITION STATUS      Incontinent  (see dc summary)  AMBULATORY STATUS COMMUNICATION OF NEEDS Skin   Extensive Assist Verbally Wound Vac (wound vac on l AKA)                       Personal Care Assistance Level of Assistance  Bathing, Feeding, Dressing Bathing Assistance: Limited assistance Feeding assistance: Limited assistance Dressing Assistance: Limited assistance     Functional Limitations Info  Sight, Hearing, Speech Sight Info: Adequate Hearing Info: Adequate Speech Info: Adequate    SPECIAL CARE FACTORS FREQUENCY  PT (By licensed PT), OT (By licensed OT)     PT Frequency: 5x week OT Frequency: 5x week            Contractures Contractures Info: Not present    Additional Factors Info  Code Status (Patient needs a wound Vac) Code Status Info: Full Code             Current Medications (09/05/2016):  This is the current hospital active medication list Current Facility-Administered Medications  Medication Dose Route Frequency Provider Last Rate Last Dose  . 0.9 %  sodium chloride infusion   Intravenous Continuous Lars Mage, PA-C   Stopped at 09/04/16 1424  . acetaminophen (TYLENOL) tablet  325-650 mg  325-650 mg Oral Q4H PRN Lars Mage, PA-C       Or  . acetaminophen (TYLENOL) suppository 325-650 mg  325-650 mg Rectal Q4H PRN Lars Mage, PA-C      . alum & mag hydroxide-simeth (MAALOX/MYLANTA) 200-200-20 MG/5ML suspension 15-30 mL  15-30 mL Oral Q2H PRN Lars Mage, PA-C      . amLODipine (NORVASC) tablet 10 mg  10 mg Oral Daily Lars Mage, PA-C   10 mg at 09/05/16 1041  . aspirin EC tablet 81 mg  81 mg Oral Daily Lars Mage, PA-C   81 mg  at 09/05/16 1043  . atorvastatin (LIPITOR) tablet 40 mg  40 mg Oral q1800 Lars Mage, PA-C   40 mg at 09/04/16 1700  . bisacodyl (DULCOLAX) EC tablet 5 mg  5 mg Oral Daily PRN Lars Mage, PA-C      . clopidogrel (PLAVIX) tablet 75 mg  75 mg Oral Daily Lars Mage, PA-C   75 mg at 09/05/16 1041  . docusate sodium (COLACE) capsule 100 mg  100 mg Oral Daily Lars Mage, PA-C   100 mg at 09/05/16 1042  . ferrous sulfate tablet 325 mg  325 mg Oral Q breakfast Lars Mage, PA-C   325 mg at 09/05/16 1041  . guaiFENesin-dextromethorphan (ROBITUSSIN DM) 100-10 MG/5ML syrup 15 mL  15 mL Oral Q4H PRN Lars Mage, PA-C      . heparin injection 5,000 Units  5,000 Units Subcutaneous Q8H Lars Mage, PA-C   5,000 Units at 09/05/16 1610  . hydrALAZINE (APRESOLINE) injection 5 mg  5 mg Intravenous Q20 Min PRN Lars Mage, PA-C      . labetalol (NORMODYNE,TRANDATE) injection 10 mg  10 mg Intravenous Q10 min PRN Lars Mage, PA-C   10 mg at 09/03/16 1554  . lactulose (CHRONULAC) 10 GM/15ML solution 20 g  20 g Oral BID Lars Mage, PA-C   20 g at 09/05/16 1041  . metoprolol (LOPRESSOR) injection 2-5 mg  2-5 mg Intravenous Q2H PRN Lars Mage, PA-C      . metoprolol tartrate (LOPRESSOR) tablet 125 mg  125 mg Oral BID Lars Mage, PA-C   125 mg at 09/05/16 1057  . mirtazapine (REMERON) tablet 7.5 mg  7.5 mg Oral QHS Lars Mage, PA-C   7.5 mg at 09/04/16 2258  . morphine 2 MG/ML injection 2-5 mg  2-5 mg Intravenous Q1H PRN Lars Mage, PA-C   2 mg at 09/05/16 1057  . ondansetron (ZOFRAN) injection 4 mg  4 mg Intravenous Q6H PRN Lars Mage, PA-C   4 mg at 09/03/16 1607  . oxyCODONE-acetaminophen (PERCOCET/ROXICET) 5-325 MG per tablet 1-2 tablet  1-2 tablet Oral Q4H PRN Lars Mage, PA-C   2 tablet at 09/05/16 9604  . pantoprazole (PROTONIX) EC tablet 40 mg  40 mg Oral Daily Lars Mage, PA-C   40 mg at 09/05/16 1043  . phenol (CHLORASEPTIC) mouth spray 1 spray  1  spray Mouth/Throat PRN Lars Mage, PA-C      . spironolactone (ALDACTONE) tablet 25 mg  25 mg Oral Daily Lars Mage, PA-C   25 mg at 09/05/16 1043  . thiamine (VITAMIN B-1) tablet 100 mg  100 mg Oral Daily Lars Mage, PA-C   100 mg at 09/05/16 1042     Discharge Medications: Please see discharge summary for a list of discharge medications.  Relevant Imaging Results:  Relevant Lab Results:   Additional Information    Althea CharonAshley C Hubert Derstine, LCSW

## 2016-09-05 NOTE — Progress Notes (Signed)
IV site and Telemetry discontinued.  CCMD notified of Tele being D/C     Report called to SNF. Patient ready for discharge.   Mini wound vac  In place.  Governor SpeckingKathryn Stepanie Graver, RN

## 2016-09-05 NOTE — Discharge Summary (Signed)
[facility] Discharge Summary  Stephen Bowen 19-Oct-1955 60 y.o. male  [id number]  Admission Date: 09/03/2016  Discharge Date: 09/05/2016  Physician: Maeola Harman*  Admission Diagnosis: NONHEALING SURGICAL WOUND  T81.89XA  HPI:   This is a 61 y.o. 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 Has his RLE revascularized now with wound of aka stump.  Hospital Course:  The patient was admitted to the hospital and taken to the operating room on 09/03/2016 and underwent: debridement of left AKA site and wound vac placement.   Findings: The skin and subcutaneous tissue were debrided with scissors and electrocautery to a size of 8 x 3 x 1 cm. At completion there was healthy bleeding from all wound sites and wound VAC was placed.  The patient tolerated the procedure well and was transported to the PACU in good condition.   The patient's wound VAC was changed on POD 2. His wound appeared clean. Will continue VAC with wound check in two weeks.   Negative pressure dressing changes every Monday, Wednesday and Friday with black foam at continuous suction 125 mmHg.   He was discharged to SNF on POD 2 in good condition.   CBC    Component Value Date/Time   WBC 7.3 09/03/2016 1621   RBC 3.88 (L) 09/03/2016 1621   HGB 11.8 (L) 09/03/2016 1621   HCT 36.8 (L) 09/03/2016 1621   PLT 384 09/03/2016 1621   MCV 94.8 09/03/2016 1621   MCH 30.4 09/03/2016 1621   MCHC 32.1 09/03/2016 1621   RDW 14.4 09/03/2016 1621   LYMPHSABS 1.8 08/01/2016 0213   MONOABS 0.5 08/01/2016 0213   EOSABS 0.2 08/01/2016 0213   BASOSABS 0.0 08/01/2016 0213    BMET    Component Value Date/Time   NA 134 (L) 09/03/2016 1621   K 4.0 09/03/2016 1621   CL 98 (L) 09/03/2016 1621   CO2 27 09/03/2016 1621   GLUCOSE 142 (H) 09/03/2016 1621   BUN 9 09/03/2016 1621   CREATININE 0.93 09/03/2016 1621   CREATININE 0.92 11/13/2013 1031   CALCIUM 9.1 09/03/2016 1621   GFRNONAA >60 09/03/2016 1621   GFRNONAA >89 11/13/2013 1031   GFRAA >60 09/03/2016 1621   GFRAA >89 11/13/2013 1031     Discharge Instructions:   The patient is discharged to SNF with extensive instructions on wound care and progressive ambulation.  They are instructed not to drive or perform any heavy lifting until returning to see the physician in his office.  Discharge Instructions    Call MD for:  redness, tenderness, or signs of infection (pain, swelling, bleeding, redness, odor or green/yellow discharge around incision site)    Complete by:  As directed    Call MD for:  severe or increased pain, loss or decreased feeling  in affected limb(s)    Complete by:  As directed    Call MD for:  temperature >100.5    Complete by:  As directed    Discharge wound care:    Complete by:  As directed    Negative pressure dressing change every Monday, Wednesday and Friday with black foam. Continuous suction 125 mmHg.   Increase activity slowly    Complete by:  As directed    Walk with assistance use walker or cane as needed   Resume previous diet    Complete by:  As directed       Discharge Diagnosis:  NONHEALING SURGICAL WOUND  T81.89XA  Secondary  Diagnosis: Patient Active Problem List   Diagnosis Date Noted  . Wound of left leg 09/03/2016  . 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 Aortic atherosclerosis (HCC) 08/01/2016  . Coronary atherosclerosis of native coronary artery 08/01/2016  . Underweight 08/01/2016  . AKI (acute kidney injury) (HCC) 07/30/2016  . Limb ischemia 07/30/2016  . HLD (hyperlipidemia) 07/30/2016  . GERD (gastroesophageal reflux disease) 07/30/2016  . Acute encephalopathy 07/30/2016  . Abdominal pain 07/30/2016  . Pressure injury of skin 06/28/2016  . 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 Aphasia   . Hypotension 06/21/2016  . Stroke (HCC) 06/21/2016  . Acute MI, anterolateral wall, initial episode of care (HCC)     . 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 Preoperative cardiovascular examination   . PAD (peripheral artery disease) (HCC) 02/13/2016  . Leg pain 02/13/2016  . Hyperkalemia 02/13/2016  . Lactic acid increased 02/13/2016  . Essential hypertension 02/13/2016  . Seborrheic dermatitis 08/31/2015  . Actinic keratosis of multiple sites of head and neck 08/31/2015  . Tobacco use disorder 08/31/2015  . Skin ulcer of scalp (HCC) 08/31/2015  . Atherosclerosis of native arteries of extremity with intermittent claudication (HCC) 07/07/2015  . Abnormal EKG 11/13/2013  . Hypertensive urgency 11/13/2013   Past Medical History:  Diagnosis Date  . HTN (hypertension)   . Non-healing surgical wound    left AKA  . Noncompliance   . PAD (peripheral artery disease) (HCC)    a.  s/p left femoral to PT artery bypass with propaten on 02/16/16.     Allergies as of 09/05/2016   No Known Allergies     Medication List    TAKE these medications   amLODipine 10 MG tablet Commonly known as:  NORVASC Take 1 tablet (10 mg total) by mouth daily.   aspirin 81 MG EC tablet Take 1 tablet (81 mg total) by mouth daily.   atorvastatin 40 MG tablet Commonly known as:  LIPITOR Take 1 tablet (40 mg total) by mouth daily at 6 PM.   bisacodyl 5 MG EC tablet Commonly known as:  DULCOLAX Take 1 tablet (5 mg total) by mouth daily as needed for moderate constipation.   clopidogrel 75 MG tablet Commonly known as:  PLAVIX Take 1 tablet (75 mg total) by mouth daily.   docusate sodium 100 MG capsule Commonly known as:  COLACE Take 1 capsule (100 mg total) by mouth daily.   ferrous sulfate 325 (65 FE) MG tablet Take 325 mg by mouth daily with breakfast.   folic acid 1 MG tablet Commonly known as:  FOLVITE Take 1 tablet (1 mg total) by mouth daily.   lactulose 10 GM/15ML solution Commonly known as:  CHRONULAC Take 30 mLs (20 g total) by mouth 2 (two) times daily.   metoprolol  tartrate 25 MG tablet Commonly known as:  LOPRESSOR Take 5 tablets (125 mg total) by mouth 2 (two) times daily.   mirtazapine 7.5 MG tablet Commonly known as:  REMERON Take 7.5 mg by mouth at bedtime.   nicotine 21 mg/24hr patch Commonly known as:  NICODERM CQ - dosed in mg/24 hours Place 1 patch (21 mg total) onto the skin daily.   omeprazole 20 MG capsule Commonly known as:  PRILOSEC Take 20 mg by mouth 2 (two) times daily before a meal.   oxyCODONE-acetaminophen 5-325 MG tablet Commonly known as:  PERCOCET/ROXICET Take 1-2 tablets by mouth every 4 (  four) hours as needed for moderate pain.   selenium sulfide 1 % Lotn Commonly known as:  SELSUN Put on your face in the shower and wash off daily   spironolactone 25 MG tablet Commonly known as:  ALDACTONE Take 1 tablet (25 mg total) by mouth daily.   thiamine 100 MG tablet Take 1 tablet (100 mg total) by mouth daily.            Durable Medical Equipment        Start     Ordered   09/03/16 1450  For home use only DME Negative pressure wound device  Once    Question Answer Comment  Frequency of dressing change 3 times per week   Length of need 3 Months   Dressing type Foam   Amount of suction 125 mm/Hg   Pressure application Continuous pressure   Supplies 10 canisters and 15 dressings per month for duration of therapy      09/03/16 1449      Percocet #30 No Refill  Disposition: SNF  Patient's condition: is Good  Follow up: 1. Dr. Randie Heinz in 2 weeks   Maris Berger, PA-C [facility] [phone] 09/05/2016  8:40 AM

## 2016-09-05 NOTE — Telephone Encounter (Signed)
-----   Message from Marilyn K McChesney, RN sent at 09/05/2016  9:21 AM EDT ----- °Regarding: 2 weeks Dr Cain ° ° °----- Message ----- °From: Kimberly A Trinh, PA-C °Sent: 09/05/2016   8:16 AM °To: [facility] Charge Pool ° °S/p debridement left AKA 09/03/16 ° °F/u with Dr. Cain in 2 weeks ° °Thanks °Kim °

## 2016-09-05 NOTE — Progress Notes (Signed)
Clinical Social Worker facilitated patient discharge including contacting patient family and facility to confirm patient discharge plans.  Clinical information faxed to facility and family agreeable with plan.  CSW [facility] .  RN to call [phone] for report prior to discharge.  Clinical Social Worker will sign off for now as social work intervention is no longer needed. Please consult us again if new need arises.  Marrianne MoodAshley Moshe Wenger, MSW, [employer]CSWA [phone]

## 2016-09-05 NOTE — Telephone Encounter (Signed)
-----   Message from Sharee PimpleMarilyn K McChesney, RN sent at 09/05/2016  9:21 AM EDT ----- Regarding: 2 weeks Dr Randie Heinzain   ----- Message ----- From: Raymond GurneyKimberly A Trinh, PA-C Sent: 09/05/2016   8:16 AM To: [facility] Charge Pool  S/p debridement left AKA 09/03/16  F/u with Dr. Randie Heinzain in 2 weeks  Thanks Selena BattenKim

## 2016-09-07 NOTE — Anesthesia Postprocedure Evaluation (Signed)
Anesthesia Post Note  Patient: Stephen Bowen  Procedure(s) Performed: Procedure(s) (LRB): IRRIGATION AND DEBRIDEMENT EXTREMITY - LEFT AKA (Left)  Patient location during evaluation: PACU Anesthesia Type: General Level of consciousness: awake Pain management: pain level controlled Respiratory status: spontaneous breathing Cardiovascular status: stable Anesthetic complications: no       Last Vitals:  Vitals:   09/05/16 1057 09/05/16 1322  BP:  120/72  Pulse: 75 77  Resp:  17  Temp:  36.5 C    Last Pain:  Vitals:   09/05/16 1322  TempSrc: Oral  PainSc:                  Kailer Heindel

## 2016-09-18 ENCOUNTER — Encounter: Payer: Self-pay | Admitting: Vascular Surgery

## 2016-09-28 ENCOUNTER — Ambulatory Visit (INDEPENDENT_AMBULATORY_CARE_PROVIDER_SITE_OTHER): Payer: Self-pay | Admitting: Vascular Surgery

## 2016-09-28 ENCOUNTER — Encounter: Payer: Self-pay | Admitting: Vascular Surgery

## 2016-09-28 VITALS — BP 126/86 | HR 91 | Temp 98.2°F | Resp 20 | Ht 68.0 in | Wt 95.0 lb

## 2016-09-28 DIAGNOSIS — T8789 Other complications of amputation stump: Secondary | ICD-10-CM

## 2016-09-28 DIAGNOSIS — I70213 Atherosclerosis of native arteries of extremities with intermittent claudication, bilateral legs: Secondary | ICD-10-CM

## 2016-09-28 NOTE — Progress Notes (Signed)
Subjective:     Patient ID: Stephen Bowen, male   DOB: 12/11/1955, 61 y.o.   MRN: [id number]  HPI Stephen Bowen follows up from recent debridement of left above-knee amputation site. He states that he has been doing well. He is conversive answer questions appropriately. He does complain of some pain in his right foot the site of some dry skin.   Review of Systems Right foot pain Left aka wound    Objective:   Physical Exam Awake and alert Answers questions appropriately Left aka with 2 x 5 cm wound with granulation tissue Right AT/peronal strong signals No right foot wounds    Assessment/plan     61 year old male follows up from recent debridement of left above-knee amputation site. This wound appears to be healing well with granulation tissue at the base. He does not need a wound VAC at this time and can continue local wound care at his facility. We will have him follow up in 3 months with repeat right lower extremity duplex and ABI to evaluate his previous retrograde cannulation work with stenting of his right SFA. Should there be issue sooner we can certainly see him prior to 3 months.    Jahquan Klugh C. Randie Heinz, MD [facility] [place] Office: [phone] Pager: [phone]

## 2016-10-01 NOTE — Addendum Note (Signed)
Addended by: Burton Apley A on: 10/01/2016 09:09 AM   Modules accepted: Orders

## 2016-11-16 ENCOUNTER — Ambulatory Visit (INDEPENDENT_AMBULATORY_CARE_PROVIDER_SITE_OTHER): Payer: Self-pay | Admitting: Vascular Surgery

## 2016-11-16 ENCOUNTER — Encounter: Payer: Self-pay | Admitting: Vascular Surgery

## 2016-11-16 VITALS — BP 106/77 | HR 83 | Temp 97.8°F | Resp 16 | Ht 68.0 in | Wt 95.0 lb

## 2016-11-16 DIAGNOSIS — I70213 Atherosclerosis of native arteries of extremities with intermittent claudication, bilateral legs: Secondary | ICD-10-CM

## 2016-11-16 NOTE — Progress Notes (Signed)
Patient ID: Stephen Bowen, male   DOB: [birth year], 61 y.o.   MRN: [id number]  Reason for Consult: Re-evaluation (Per [facility], pts toes on R foot discolored.  )   Referred by No ref. provider found  Subjective:     HPI:  Stephen Bowen is a 61 y.o. male with history of bilateral lower extremity peripheral arterial disease. Chiefly he had left lower extremity revascularization requiring bypass ultimately failed and revision was, located by stroke and myocardial infarction. He ultimately had a dictation of the left lower extremity above-knee and then had wound complications. He now presents with coolness and mottling of his right foot. His previous angiogram last August demonstrated single-vessel runoff to his foot although there was a long segment occlusion from his SFA to his anterior tibial. He also possibly a peroneal artery running off to the level of the ankle that discontinued there. He is nonambulatory continues low functioning given his previous history of stroke and is wheelchair-bound at this time.  Past Medical History:  Diagnosis Date  . HTN (hypertension)   . Non-healing surgical wound    left AKA  . Noncompliance   . PAD (peripheral artery disease) (HCC)    a.  s/p left femoral to PT artery bypass with propaten on 02/16/16.   Family History  Problem Relation Age of Onset  . Cancer Mother        deceased in 8s   . Heart attack Mother        diseased  . Heart disease Father   . Stroke Sister        age 19s  . Heart disease Brother        pacemaker in his 73s   Past Surgical History:  Procedure Laterality Date  . ABDOMINAL AORTOGRAM W/LOWER EXTREMITY N/A 08/02/2016   Procedure: Abdominal Aortogram w/Lower Extremity;  Surgeon: Maeola Harman, MD;  Location: [facility] INVASIVE CV LAB;  Service: Cardiovascular;  Laterality: N/A;  . AMPUTATION Left 06/25/2016   Procedure: AMPUTATION ABOVE KNEE;  Surgeon: Nada Libman, MD;  Location: [facility] OR;   Service: Vascular;  Laterality: Left;  . BYPASS GRAFT POPLITEAL TO TIBIAL Left 06/21/2016   Procedure: BYPASS GRAFT POPLITEAL TO TIBIAL USING GORE PROPATEN GRAFT;  Surgeon: Maeola Harman, MD;  Location: [facility] OR;  Service: Vascular;  Laterality: Left;  . EMBOLECTOMY Left 06/21/2016   Procedure: EMBOLECTOMY left lower extremity.;  Surgeon: Maeola Harman, MD;  Location: [facility] OR;  Service: Vascular;  Laterality: Left;  . ENDARTERECTOMY FEMORAL Left 02/16/2016   Procedure: LEFT FEMORAL ENDARTERECTOMY;  Surgeon: Maeola Harman, MD;  Location: [facility] OR;  Service: Vascular;  Laterality: Left;  . FEMORAL BYPASS  02/16/2016   LEFT FEMORAL-POSTERIOR TIBIAL ARTERY BYPASS  WITH PROPATEN 6 MM X 80 CM VASCULAR RING GRAFT (Left)  . FEMORAL-TIBIAL BYPASS GRAFT Left 02/16/2016   Procedure: LEFT FEMORAL-POSTERIOR TIBIAL ARTERY BYPASS  WITH PROPATEN 6 MM X 80 CM VASCULAR RING GRAFT;  Surgeon: Maeola Harman, MD;  Location: [facility] OR;  Service: Vascular;  Laterality: Left;  . I&D EXTREMITY Left 09/03/2016   Procedure: IRRIGATION AND DEBRIDEMENT EXTREMITY - LEFT AKA;  Surgeon: Maeola Harman, MD;  Location: [facility] OR;  Service: Vascular;  Laterality: Left;  . PATCH ANGIOPLASTY Left 02/16/2016   Procedure: PATCH ANGIOPLASTY WITH Livia Snellen BIOLOGIC PATCH 1 CM X 6 CM;  Surgeon: Maeola Harman, MD;  Location: [facility] OR;  Service: Vascular;  Laterality: Left;  . PERIPHERAL VASCULAR BALLOON  ANGIOPLASTY Right 08/02/2016   Procedure: Peripheral Vascular Balloon Angioplasty;  Surgeon: Maeola HarmanBrandon Christopher Sterling Ucci, MD;  [facility];  Service: Cardiovascular;  Laterality: Right;  peroneal artery  . PERIPHERAL VASCULAR CATHETERIZATION N/A 07/07/2015   Procedure: Abdominal Aortogram w/Lower Extremity;  Surgeon: Fransisco HertzBrian L Chen, MD;  [facility];  Service: Cardiovascular;  Laterality: N/A;  . PERIPHERAL VASCULAR CATHETERIZATION N/A 02/15/2016   Procedure: Abdominal  Aortogram w/Lower Extremity;  Surgeon: Maeola HarmanBrandon Christopher Anyia Gierke, MD;  [facility];  Service: Cardiovascular;  Laterality: N/A;  . PERIPHERAL VASCULAR CATHETERIZATION Bilateral 06/20/2016   Procedure: Lower Extremity Angiography;  Surgeon: Maeola HarmanBrandon Christopher Laverle Pillard, MD;  [facility];  Service: Cardiovascular;  Laterality: Bilateral;  limited runoff on rt leg thrombolysis lt leg bypass graft  . PERIPHERAL VASCULAR INTERVENTION Right 08/02/2016   Procedure: Peripheral Vascular Intervention;  Surgeon: Maeola HarmanBrandon Christopher Weylyn Ricciuti, MD;  [facility];  Service: Cardiovascular;  Laterality: Right;  Femoral , popliteal  . 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    Short Social History:  Social History  Substance Use Topics  . Smoking status: Former Smoker    Packs/day: 3.00    Years: 40.00  . Smokeless tobacco: Never Used     Comment: cutting back amount he is buying  . Alcohol use 8.4 oz/week    14 Cans of beer per week     Comment: Drinks a 40 oz "tallboy" daily    No Known Allergies  Current Outpatient Prescriptions  Medication Sig Dispense Refill  . amLODipine (NORVASC) 10 MG tablet Take 1 tablet (10 mg total) by mouth daily.    Marland Kitchen. aspirin EC 81 MG EC tablet Take 1 tablet (81 mg total) by mouth daily.    Marland Kitchen. atorvastatin (LIPITOR) 40 MG tablet Take 1 tablet (40 mg total) by mouth daily at 6 PM. 30 tablet 0  . bisacodyl (DULCOLAX) 5 MG EC tablet Take 1 tablet (5 mg total) by mouth daily as needed for moderate constipation. 30 tablet 0  . clopidogrel (PLAVIX) 75 MG tablet Take 1 tablet (75 mg total) by mouth daily.    Marland Kitchen. docusate sodium (COLACE) 100 MG capsule Take 1 capsule (100 mg total) by mouth daily. 10 capsule 0  . ferrous sulfate 325 (65 FE) MG tablet Take 325 mg by mouth daily with breakfast.    . folic acid (FOLVITE) 1 MG tablet Take 1 tablet (1 mg total) by mouth daily. (Patient not taking: Reported  on 09/03/2016)    . lactulose (CHRONULAC) 10 GM/15ML solution Take 30 mLs (20 g total) by mouth 2 (two) times daily. 240 mL 0  . metoprolol tartrate (LOPRESSOR) 25 MG tablet Take 5 tablets (125 mg total) by mouth 2 (two) times daily.    . mirtazapine (REMERON) 7.5 MG tablet Take 7.5 mg by mouth at bedtime.    . nicotine (NICODERM CQ - DOSED IN MG/24 HOURS) 21 mg/24hr patch Place 1 patch (21 mg total) onto the skin daily. (Patient not taking: Reported on 09/03/2016) 28 patch 0  . omeprazole (PRILOSEC) 20 MG capsule Take 20 mg by mouth 2 (two) times daily before a meal.    . oxyCODONE-acetaminophen (PERCOCET/ROXICET) 5-325 MG tablet Take 1-2 tablets by mouth every 4 (four) hours as needed for moderate pain. 30 tablet 0  . selenium sulfide (SELSUN) 1 % LOTN Put on your face in the shower and wash off  daily (Patient not taking: Reported on 09/03/2016) 1 Bottle 3  . spironolactone (ALDACTONE) 25 MG tablet Take 1 tablet (25 mg total) by mouth daily.    Marland Kitchen thiamine 100 MG tablet Take 1 tablet (100 mg total) by mouth daily.     No current facility-administered medications for this visit.     REVIEW OF SYSTEMS  No complaints    Objective:  Objective   Vitals:   11/16/16 1257  BP: 106/77  Pulse: 83  Resp: 16  Temp: 97.8 F (36.6 C)  TempSrc: Oral  SpO2: 97%  Weight: 95 lb (43.1 kg)  Height: 5\' 8"  (1.727 m)   Body mass index is 14.44 kg/m.  Physical Exam Awake and alert Left aka site has healed well R foot with weak dp signal Foot is mottles and toes have beginning of necrosis  Data: I reviewed the most recent angiogram of his right lower extremity last August with the above findings of sfa occlusion reconstituting his proximal anterior tibial artery.     Assessment/Plan:     61 year old male sent for evaluation of right foot mottling and early gangrene of his toes. He would need extensive right lower extremity endovascular versus open repair and given his lack of function of the  right leg as well as history of myocardial infarction and stroke with last procedure I think it is best to proceed with above-knee amputation. I discussed this with him today. Unfortunately his brother recently passed away and he rated him in decisions. We'll have his facility figure out his consent issues and get him scheduled for above-knee amputation on the right in the very near future.     Maeola Harman MD [facility]

## 2016-11-21 ENCOUNTER — Telehealth: Payer: Self-pay | Admitting: *Deleted

## 2016-11-21 NOTE — Telephone Encounter (Signed)
I have called Stephen Bowen twice on 11-19-16 and left messages for their social worker Stephen Bowen or their Business Manager to contact me about who is this patient's POA. His brother Stephen Bowen expired on 10-02-16 was his previous POA. Dr. Randie Heinzain needs to perform a right Above knee amputation asap.   I called again today and left another message with the Social worker, Stephen Bowen, Bowen, to call me back asap. I spoke to Stephen Bowen and she stated that only Mr Stephen Bowen could help me with this.

## 2016-11-21 NOTE — Telephone Encounter (Signed)
Mr Alison StallingMarley called me back to let me know that another brother, Precious ReelJerry Lehan was this patient's POA now. His phone number is [phone].  I called Precious ReelJerry Jarmon and explained the circumstances and the need for Dr. Randie Heinzain to perform a right AKA. He was upset to hear this but said that he understood all the medical reasons that necessitate the amputation. He gave me a verbal approval to proceed with the scheduling of this surgery. I told him that I would call him back asap with all the information. I also told him the a formal telephone consent for surgery would be obtained from him. Mr. Precious ReelJerry Messman voiced understanding and agreement of this surgery plan for his brother, Marijo SanesJerome Stawicki.   I will staff message Dr. Randie Heinzain and coordinate a date for this surgery with [facility] and patient's family

## 2016-11-23 ENCOUNTER — Other Ambulatory Visit: Payer: Self-pay | Admitting: *Deleted

## 2016-11-26 NOTE — Addendum Note (Signed)
Addendum  created 11/26/16 0944 by Abbegail Matuska, MD   Sign clinical note    

## 2016-12-03 ENCOUNTER — Encounter (HOSPITAL_COMMUNITY): Payer: Self-pay | Admitting: *Deleted

## 2016-12-03 NOTE — Progress Notes (Signed)
I spoke with Jeanice LimHolly, Mr Faye RamsayMc Queen's nurse and obtained updated health information.  Mr Jenne CampusMcQueen is alert, and communications approiately, is not oriented.  Patient transfers by pivoting on foot.  Precious ReelJerry Zylka is patient's brother and next of kin per [facility]ocial [facility].  There are no POA papers.  SNF and I have called Jerry's number and not gotten an answer.

## 2016-12-04 ENCOUNTER — Inpatient Hospital Stay (HOSPITAL_COMMUNITY): Payer: Medicaid Other | Admitting: Certified Registered Nurse Anesthetist

## 2016-12-04 ENCOUNTER — Inpatient Hospital Stay (HOSPITAL_COMMUNITY)
Admission: RE | Admit: 2016-12-04 | Discharge: 2016-12-06 | DRG: 241 | Disposition: A | Discharge status: Rehabilitation Facility | Payer: Medicaid Other | Source: Ambulatory Visit | Attending: Vascular Surgery | Admitting: Vascular Surgery

## 2016-12-04 ENCOUNTER — Encounter (HOSPITAL_COMMUNITY)
Admission: RE | Disposition: A | Discharge status: Rehabilitation Facility | Payer: Self-pay | Source: Ambulatory Visit | Attending: Vascular Surgery

## 2016-12-04 ENCOUNTER — Encounter (HOSPITAL_COMMUNITY): Payer: Self-pay | Admitting: Anesthesiology

## 2016-12-04 DIAGNOSIS — I2510 Atherosclerotic heart disease of native coronary artery without angina pectoris: Secondary | ICD-10-CM | POA: Diagnosis present

## 2016-12-04 DIAGNOSIS — I10 Essential (primary) hypertension: Secondary | ICD-10-CM | POA: Diagnosis present

## 2016-12-04 DIAGNOSIS — I252 Old myocardial infarction: Secondary | ICD-10-CM

## 2016-12-04 DIAGNOSIS — I96 Gangrene, not elsewhere classified: Principal | ICD-10-CM | POA: Diagnosis present

## 2016-12-04 DIAGNOSIS — Z7982 Long term (current) use of aspirin: Secondary | ICD-10-CM

## 2016-12-04 DIAGNOSIS — Z7902 Long term (current) use of antithrombotics/antiplatelets: Secondary | ICD-10-CM | POA: Diagnosis not present

## 2016-12-04 DIAGNOSIS — Z89612 Acquired absence of left leg above knee: Secondary | ICD-10-CM | POA: Diagnosis not present

## 2016-12-04 DIAGNOSIS — E785 Hyperlipidemia, unspecified: Secondary | ICD-10-CM | POA: Diagnosis present

## 2016-12-04 DIAGNOSIS — I70261 Atherosclerosis of native arteries of extremities with gangrene, right leg: Secondary | ICD-10-CM | POA: Diagnosis not present

## 2016-12-04 HISTORY — DX: Atherosclerotic heart disease of native coronary artery without angina pectoris: I25.10

## 2016-12-04 HISTORY — PX: AMPUTATION: SHX166

## 2016-12-04 HISTORY — DX: Encephalopathy, unspecified: G93.40

## 2016-12-04 HISTORY — DX: Acute myocardial infarction, unspecified: I21.9

## 2016-12-04 HISTORY — PX: ABOVE KNEE LEG AMPUTATION: SUR20

## 2016-12-04 HISTORY — DX: Hyperlipidemia, unspecified: E78.5

## 2016-12-04 LAB — CREATININE, SERUM
CREATININE: 0.8 mg/dL (ref 0.61–1.24)
GFR calc Af Amer: >60 mL/min (ref >60)

## 2016-12-04 LAB — CBC
HCT: 37.1 % — ABNORMAL LOW (ref 39.0–52.0)
HCT: 32.7 % — ABNORMAL LOW (ref 39.0–52.0)
HEMOGLOBIN: 10.3 g/dL — AB (ref 13.0–17.0)
Hemoglobin: 11.9 g/dL — ABNORMAL LOW (ref 13.0–17.0)
MCH: 28.8 pg (ref 26.0–34.0)
MCH: 28 pg (ref 26.0–34.0)
MCHC: 32.1 g/dL (ref 30.0–36.0)
MCHC: 31.5 g/dL (ref 30.0–36.0)
MCV: 89.8 fL (ref 78.0–100.0)
MCV: 88.9 fL (ref 78.0–100.0)
PLATELETS: 739 10*3/uL — AB (ref 150–400)
Platelets: 615 10*3/uL — ABNORMAL HIGH (ref 150–400)
RBC: 4.13 MIL/uL — ABNORMAL LOW (ref 4.22–5.81)
RBC: 3.68 MIL/uL — ABNORMAL LOW (ref 4.22–5.81)
RDW: 13.6 % (ref 11.5–15.5)
RDW: 13.6 % (ref 11.5–15.5)
WBC: 16 10*3/uL — AB (ref 4.0–10.5)
WBC: 12.1 10*3/uL — ABNORMAL HIGH (ref 4.0–10.5)

## 2016-12-04 LAB — COMPREHENSIVE METABOLIC PANEL
ALK PHOS: 113 U/L (ref 38–126)
ALT: 32 U/L (ref 17–63)
ANION GAP: 14 (ref 5–15)
AST: 16 U/L (ref 15–41)
Albumin: 3.3 g/dL — ABNORMAL LOW (ref 3.5–5.0)
BILIRUBIN TOTAL: 0.5 mg/dL (ref 0.3–1.2)
BUN: 15 mg/dL (ref 6–20)
CALCIUM: 9.9 mg/dL (ref 8.9–10.3)
CO2: 27 mmol/L (ref 22–32)
CREATININE: 0.97 mg/dL (ref 0.61–1.24)
Chloride: 90 mmol/L — ABNORMAL LOW (ref 101–111)
GFR calc non Af Amer: >60 mL/min (ref >60)
Glucose, Bld: 112 mg/dL — ABNORMAL HIGH (ref 65–99)
Potassium: 4.8 mmol/L (ref 3.5–5.1)
Sodium: 131 mmol/L — ABNORMAL LOW (ref 135–145)
Total Protein: 8.5 g/dL — ABNORMAL HIGH (ref 6.5–8.1)

## 2016-12-04 LAB — SURGICAL PCR SCREEN
MRSA, PCR: POSITIVE — AB
STAPHYLOCOCCUS AUREUS: POSITIVE — AB

## 2016-12-04 SURGERY — AMPUTATION, ABOVE KNEE
Anesthesia: General | Site: Leg Upper | Laterality: Right

## 2016-12-04 MED ORDER — MUPIROCIN 2 % EX OINT
TOPICAL_OINTMENT | CUTANEOUS | Status: AC
  Administered 2016-12-04: 1 via TOPICAL
  Filled 2016-12-04: qty 22

## 2016-12-04 MED ORDER — BISACODYL 10 MG RE SUPP: 10.0000 mg | Freq: Every day | RECTAL | Status: DC | PRN

## 2016-12-04 MED ORDER — SODIUM CHLORIDE 0.9 % IV SOLN: INTRAVENOUS | Status: DC

## 2016-12-04 MED ORDER — ENOXAPARIN SODIUM 40 MG/0.4ML ~~LOC~~ SOLN: 40.0000 mg | SUBCUTANEOUS | Status: DC

## 2016-12-04 MED ORDER — DEXTROSE 5 % IV SOLN
1.5000 g | INTRAVENOUS | Status: AC
  Administered 2016-12-04: 1.5 g via INTRAVENOUS
  Filled 2016-12-04: qty 1.5

## 2016-12-04 MED ORDER — BISACODYL 5 MG PO TBEC: 5.0000 mg | DELAYED_RELEASE_TABLET | Freq: Every day | ORAL | Status: DC | PRN

## 2016-12-04 MED ORDER — ASPIRIN EC 81 MG PO TBEC
81.0000 mg | DELAYED_RELEASE_TABLET | Freq: Every day | ORAL | Status: DC
  Administered 2016-12-05 – 2016-12-06 (×2): 81 mg via ORAL
  Filled 2016-12-04 (×2): qty 1

## 2016-12-04 MED ORDER — ACETAMINOPHEN 325 MG RE SUPP: 325.0000 mg | RECTAL | Status: DC | PRN

## 2016-12-04 MED ORDER — CLOPIDOGREL BISULFATE 75 MG PO TABS
75.0000 mg | ORAL_TABLET | Freq: Every day | ORAL | Status: DC
  Administered 2016-12-05 – 2016-12-06 (×2): 75 mg via ORAL
  Filled 2016-12-04 (×2): qty 1

## 2016-12-04 MED ORDER — LACTULOSE 10 GM/15ML PO SOLN
20.0000 g | Freq: Two times a day (BID) | ORAL | Status: DC
  Administered 2016-12-04 – 2016-12-06 (×3): 20 g via ORAL
  Filled 2016-12-04 (×4): qty 30

## 2016-12-04 MED ORDER — MUPIROCIN 2 % EX OINT
1.0000 "application " | TOPICAL_OINTMENT | Freq: Once | CUTANEOUS | Status: AC
  Administered 2016-12-04: 1 via TOPICAL

## 2016-12-04 MED ORDER — FENTANYL CITRATE (PF) 250 MCG/5ML IJ SOLN
INTRAMUSCULAR | Status: DC | PRN
  Administered 2016-12-04: 50 ug via INTRAVENOUS
  Administered 2016-12-04: 100 ug via INTRAVENOUS
  Administered 2016-12-04 (×2): 50 ug via INTRAVENOUS

## 2016-12-04 MED ORDER — METOPROLOL TARTRATE 25 MG PO TABS
125.0000 mg | ORAL_TABLET | Freq: Two times a day (BID) | ORAL | Status: DC
  Administered 2016-12-04 – 2016-12-06 (×4): 125 mg via ORAL
  Filled 2016-12-04 (×6): qty 1

## 2016-12-04 MED ORDER — ASPIRIN 81 MG PO TBEC: 81.0000 mg | DELAYED_RELEASE_TABLET | Freq: Every day | ORAL | Status: DC

## 2016-12-04 MED ORDER — HYDROMORPHONE HCL 1 MG/ML IJ SOLN
INTRAMUSCULAR | Status: AC
Start: 2016-12-04 — End: 2016-12-05
  Filled 2016-12-04: qty 0.5

## 2016-12-04 MED ORDER — METOPROLOL TARTRATE 5 MG/5ML IV SOLN: 2.0000 mg | INTRAVENOUS | Status: DC | PRN

## 2016-12-04 MED ORDER — LACTATED RINGERS IV SOLN
INTRAVENOUS | Status: DC
  Administered 2016-12-04: 10:00:00 via INTRAVENOUS

## 2016-12-04 MED ORDER — 0.9 % SODIUM CHLORIDE (POUR BTL) OPTIME
TOPICAL | Status: DC | PRN
  Administered 2016-12-04: 1000 mL

## 2016-12-04 MED ORDER — MORPHINE SULFATE (PF) 2 MG/ML IV SOLN
1.0000 mg | INTRAVENOUS | Status: DC | PRN
  Administered 2016-12-04 – 2016-12-06 (×4): 1 mg via INTRAVENOUS
  Filled 2016-12-04 (×4): qty 1

## 2016-12-04 MED ORDER — PROPOFOL 10 MG/ML IV BOLUS
INTRAVENOUS | Status: AC
  Filled 2016-12-04: qty 20

## 2016-12-04 MED ORDER — POLYETHYLENE GLYCOL 3350 17 G PO PACK: 17.0000 g | PACK | Freq: Every day | ORAL | Status: DC | PRN

## 2016-12-04 MED ORDER — MAGNESIUM SULFATE 2 GM/50ML IV SOLN
2.0000 g | Freq: Every day | INTRAVENOUS | Status: DC | PRN
  Filled 2016-12-04: qty 50

## 2016-12-04 MED ORDER — ONDANSETRON HCL 4 MG/2ML IJ SOLN
INTRAMUSCULAR | Status: DC | PRN
  Administered 2016-12-04: 4 mg via INTRAVENOUS

## 2016-12-04 MED ORDER — SPIRONOLACTONE 25 MG PO TABS
25.0000 mg | ORAL_TABLET | Freq: Every day | ORAL | Status: DC
Start: 2016-12-05 — End: 2016-12-06
  Administered 2016-12-05 – 2016-12-06 (×2): 25 mg via ORAL
  Filled 2016-12-04 (×2): qty 1

## 2016-12-04 MED ORDER — CHLORHEXIDINE GLUCONATE 4 % EX LIQD: 60.0000 mL | Freq: Once | CUTANEOUS | Status: DC

## 2016-12-04 MED ORDER — MIDAZOLAM HCL 2 MG/2ML IJ SOLN
INTRAMUSCULAR | Status: AC
  Filled 2016-12-04: qty 2

## 2016-12-04 MED ORDER — MIRTAZAPINE 15 MG PO TABS
7.5000 mg | ORAL_TABLET | Freq: Every day | ORAL | Status: DC
  Administered 2016-12-04 – 2016-12-05 (×2): 7.5 mg via ORAL
  Filled 2016-12-04 (×2): qty 1

## 2016-12-04 MED ORDER — LABETALOL HCL 5 MG/ML IV SOLN: 10.0000 mg | INTRAVENOUS | Status: DC | PRN

## 2016-12-04 MED ORDER — ACETAMINOPHEN 325 MG PO TABS: 325.0000 mg | ORAL_TABLET | ORAL | Status: DC | PRN

## 2016-12-04 MED ORDER — PROMETHAZINE HCL 25 MG/ML IJ SOLN: 6.2500 mg | INTRAMUSCULAR | Status: DC | PRN

## 2016-12-04 MED ORDER — POTASSIUM CHLORIDE CRYS ER 20 MEQ PO TBCR: 20.0000 meq | EXTENDED_RELEASE_TABLET | Freq: Every day | ORAL | Status: DC | PRN

## 2016-12-04 MED ORDER — NALOXONE HCL 0.4 MG/ML IJ SOLN
INTRAMUSCULAR | Status: DC | PRN
  Administered 2016-12-04: 40 ug via INTRAVENOUS

## 2016-12-04 MED ORDER — ATORVASTATIN CALCIUM 40 MG PO TABS
40.0000 mg | ORAL_TABLET | Freq: Every day | ORAL | Status: DC
  Administered 2016-12-04 – 2016-12-05 (×2): 40 mg via ORAL
  Filled 2016-12-04: qty 1

## 2016-12-04 MED ORDER — DEXTROSE 5 % IV SOLN
1.5000 g | Freq: Two times a day (BID) | INTRAVENOUS | Status: AC
  Administered 2016-12-04 – 2016-12-05 (×2): 1.5 g via INTRAVENOUS
  Filled 2016-12-04 (×2): qty 1.5

## 2016-12-04 MED ORDER — HYDROCODONE-ACETAMINOPHEN 5-325 MG PO TABS
1.0000 | ORAL_TABLET | Freq: Three times a day (TID) | ORAL | Status: DC | PRN
  Administered 2016-12-04 – 2016-12-06 (×5): 1 via ORAL
  Filled 2016-12-04 (×6): qty 1

## 2016-12-04 MED ORDER — FENTANYL CITRATE (PF) 250 MCG/5ML IJ SOLN
INTRAMUSCULAR | Status: AC
  Filled 2016-12-04: qty 5

## 2016-12-04 MED ORDER — ALUM & MAG HYDROXIDE-SIMETH 200-200-20 MG/5ML PO SUSP: 15.0000 mL | ORAL | Status: DC | PRN

## 2016-12-04 MED ORDER — ONDANSETRON HCL 4 MG/2ML IJ SOLN: 4.0000 mg | Freq: Four times a day (QID) | INTRAMUSCULAR | Status: DC | PRN

## 2016-12-04 MED ORDER — LIDOCAINE HCL (CARDIAC) 20 MG/ML IV SOLN
INTRAVENOUS | Status: DC | PRN
  Administered 2016-12-04: 100 mg via INTRATRACHEAL

## 2016-12-04 MED ORDER — PHENOL 1.4 % MT LIQD: 1.0000 | OROMUCOSAL | Status: DC | PRN

## 2016-12-04 MED ORDER — SODIUM CHLORIDE 0.9 % IV SOLN
INTRAVENOUS | Status: DC
  Administered 2016-12-04 – 2016-12-06 (×2): via INTRAVENOUS

## 2016-12-04 MED ORDER — PANTOPRAZOLE SODIUM 20 MG PO TBEC
20.0000 mg | DELAYED_RELEASE_TABLET | Freq: Every day | ORAL | Status: DC
  Administered 2016-12-05 – 2016-12-06 (×2): 20 mg via ORAL
  Filled 2016-12-04 (×2): qty 1

## 2016-12-04 MED ORDER — LIDOCAINE 2% (20 MG/ML) 5 ML SYRINGE
INTRAMUSCULAR | Status: AC
  Filled 2016-12-04: qty 5

## 2016-12-04 MED ORDER — ACETAMINOPHEN 10 MG/ML IV SOLN: 1000.0000 mg | Freq: Once | INTRAVENOUS | Status: DC | PRN

## 2016-12-04 MED ORDER — DOCUSATE SODIUM 100 MG PO CAPS
100.0000 mg | ORAL_CAPSULE | Freq: Every day | ORAL | Status: DC
  Administered 2016-12-05 – 2016-12-06 (×2): 100 mg via ORAL
  Filled 2016-12-04 (×2): qty 1

## 2016-12-04 MED ORDER — FOLIC ACID 1 MG PO TABS
1.0000 mg | ORAL_TABLET | Freq: Every day | ORAL | Status: DC
Start: 2016-12-05 — End: 2016-12-06
  Administered 2016-12-05 – 2016-12-06 (×2): 1 mg via ORAL
  Filled 2016-12-04 (×2): qty 1

## 2016-12-04 MED ORDER — HYDRALAZINE HCL 20 MG/ML IJ SOLN: 5.0000 mg | INTRAMUSCULAR | Status: DC | PRN

## 2016-12-04 MED ORDER — HYDROMORPHONE HCL 1 MG/ML IJ SOLN
0.2500 mg | INTRAMUSCULAR | Status: DC | PRN
  Administered 2016-12-04: 0.25 mg via INTRAVENOUS

## 2016-12-04 MED ORDER — MEPERIDINE HCL 25 MG/ML IJ SOLN: 6.2500 mg | INTRAMUSCULAR | Status: DC | PRN

## 2016-12-04 MED ORDER — PROPOFOL 10 MG/ML IV BOLUS
INTRAVENOUS | Status: DC | PRN
  Administered 2016-12-04: 120 mg via INTRAVENOUS

## 2016-12-04 MED ORDER — AMLODIPINE BESYLATE 10 MG PO TABS
10.0000 mg | ORAL_TABLET | Freq: Every day | ORAL | Status: DC
  Administered 2016-12-05 – 2016-12-06 (×2): 10 mg via ORAL
  Filled 2016-12-04 (×2): qty 1

## 2016-12-04 MED ORDER — GUAIFENESIN-DM 100-10 MG/5ML PO SYRP: 15.0000 mL | ORAL_SOLUTION | ORAL | Status: DC | PRN

## 2016-12-04 MED ORDER — FERROUS SULFATE 325 (65 FE) MG PO TABS
325.0000 mg | ORAL_TABLET | Freq: Every day | ORAL | Status: DC
  Administered 2016-12-05 – 2016-12-06 (×2): 325 mg via ORAL
  Filled 2016-12-04 (×2): qty 1

## 2016-12-04 SURGICAL SUPPLY — 42 items
BANDAGE ACE 4X5 VEL STRL LF (GAUZE/BANDAGES/DRESSINGS) ×2 IMPLANT
BANDAGE ACE 6X5 VEL STRL LF (GAUZE/BANDAGES/DRESSINGS) ×2 IMPLANT
BANDAGE ELASTIC 4 VELCRO ST LF (GAUZE/BANDAGES/DRESSINGS) ×2 IMPLANT
BLADE SAW GIGLI 510 (BLADE) ×2 IMPLANT
BNDG COHESIVE 6X5 TAN STRL LF (GAUZE/BANDAGES/DRESSINGS) ×2 IMPLANT
BNDG GAUZE ELAST 4 BULKY (GAUZE/BANDAGES/DRESSINGS) ×2 IMPLANT
CANISTER SUCT 3000ML PPV (MISCELLANEOUS) ×2 IMPLANT
CLIP TI MEDIUM 6 (CLIP) ×2 IMPLANT
COVER SURGICAL LIGHT HANDLE (MISCELLANEOUS) ×2 IMPLANT
DRAIN CHANNEL 19F RND (DRAIN) IMPLANT
DRAPE HALF SHEET 40X57 (DRAPES) ×2 IMPLANT
DRAPE ORTHO SPLIT 77X108 STRL (DRAPES) ×2
DRAPE SURG ORHT 6 SPLT 77X108 (DRAPES) ×2 IMPLANT
DRSG ADAPTIC 3X8 NADH LF (GAUZE/BANDAGES/DRESSINGS) ×2 IMPLANT
ELECT REM PT RETURN 9FT ADLT (ELECTROSURGICAL) ×2
ELECTRODE REM PT RTRN 9FT ADLT (ELECTROSURGICAL) ×1 IMPLANT
EVACUATOR SILICONE 100CC (DRAIN) IMPLANT
GAUZE SPONGE 4X4 12PLY STRL (GAUZE/BANDAGES/DRESSINGS) ×2 IMPLANT
GLOVE BIO SURGEON STRL SZ7.5 (GLOVE) ×2 IMPLANT
GOWN STRL REUS W/ TWL LRG LVL3 (GOWN DISPOSABLE) ×2 IMPLANT
GOWN STRL REUS W/ TWL XL LVL3 (GOWN DISPOSABLE) ×1 IMPLANT
GOWN STRL REUS W/TWL LRG LVL3 (GOWN DISPOSABLE) ×2
GOWN STRL REUS W/TWL XL LVL3 (GOWN DISPOSABLE) ×1
KIT BASIN OR (CUSTOM PROCEDURE TRAY) ×2 IMPLANT
KIT ROOM TURNOVER OR (KITS) ×2 IMPLANT
NS IRRIG 1000ML POUR BTL (IV SOLUTION) ×2 IMPLANT
PACK GENERAL/GYN (CUSTOM PROCEDURE TRAY) ×2 IMPLANT
PAD ARMBOARD 7.5X6 YLW CONV (MISCELLANEOUS) ×4 IMPLANT
SLEEVE SURGEON STRL (DRAPES) ×2 IMPLANT
STAPLER VISISTAT 35W (STAPLE) ×4 IMPLANT
STOCKINETTE IMPERVIOUS LG (DRAPES) ×2 IMPLANT
SUT ETHILON 3 0 PS 1 (SUTURE) IMPLANT
SUT SILK 0 TIES 10X30 (SUTURE) ×2 IMPLANT
SUT SILK 2 0 (SUTURE) ×1
SUT SILK 2-0 18XBRD TIE 12 (SUTURE) ×1 IMPLANT
SUT SILK 3 0 (SUTURE)
SUT SILK 3-0 18XBRD TIE 12 (SUTURE) IMPLANT
SUT VIC AB 2-0 CT1 18 (SUTURE) ×4 IMPLANT
TOWEL OR 17X24 6PK STRL BLUE (TOWEL DISPOSABLE) ×2 IMPLANT
TOWEL OR 17X26 10 PK STRL BLUE (TOWEL DISPOSABLE) ×2 IMPLANT
UNDERPAD 30X30 (UNDERPADS AND DIAPERS) ×2 IMPLANT
WATER STERILE IRR 1000ML POUR (IV SOLUTION) ×2 IMPLANT

## 2016-12-04 NOTE — Progress Notes (Signed)
I have been asked by Dr.  Randie Heinzain to evaluate the patient's right leg.  He has a necrotic fore foot and mottling up to th knee.  The leg is not salvagable.  He needs a above knee amputation.  Delaying this could lead to sepsis from his gangrene to the leg.  Stephen Bowen

## 2016-12-04 NOTE — Anesthesia Preprocedure Evaluation (Addendum)
Anesthesia Evaluation  Patient identified by MRN, date of birth, ID band Patient confused    Reviewed: Allergy & Precautions, NPO status , Patient's Chart, lab work & pertinent test results  Airway Mallampati: II  TM Distance: >3 FB     Dental  (+) Missing, Loose, Dental Advisory Given   Pulmonary Current Smoker, former smoker,    breath sounds clear to auscultation       Cardiovascular hypertension, Pt. on medications + CAD, + Past MI and + Peripheral Vascular Disease  Normal cardiovascular exam Rhythm:Regular Rate:Normal     Neuro/Psych    GI/Hepatic Neg liver ROS, GERD  ,  Endo/Other  negative endocrine ROS  Renal/GU Renal disease     Musculoskeletal   Abdominal (+) + scaphoid   Peds  Hematology   Anesthesia Other Findings ECHO COMPLETE WO IMAGE ENHANCING AGENT  Order# [id number]  Reading physician: Jake BatheSkains, Mark C, MD Ordering physician: Estrella Deedsunn, Dayna N, PA-C Study date: 06/23/16 Study Result   Result status: Final result                             *[place]*                   *[facility]                         [address]                        [place], [place] [address]                            [phone]  ------------------------------------------------------------------- Transthoracic Echocardiography  Patient:    Stephen Bowen, Stephen Bowen MR #:       [id number] Study Date: 06/23/2016 Gender:     M Age:        60 Height:     172.7 cm Weight:     55.5 kg BSA:        1.62 m^2 Pt. Status: Room:       2S08C   Debria GarretDERING     Dunn, Dayna N  REFERRING    Dunn, Dayna N  ADMITTING    Durene CalBrabham, Wells  ATTENDING    Durene CalBrabham, Wells  PERFORMING   [facility], Inpatient  SONOGRAPHER  Sheralyn Boatmanina West  cc:  ------------------------------------------------------------------- LV EF: 60% -   65%     Reproductive/Obstetrics                           Anesthesia  Physical  Anesthesia Plan  ASA: III  Anesthesia Plan: General   Post-op Pain Management:    Induction: Intravenous  PONV Risk Score and Plan: 3 and Ondansetron, Dexamethasone, Propofol, Midazolam and Treatment may vary due to age or medical condition  Airway Management Planned: LMA  Additional Equipment:   Intra-op Plan:   Post-operative Plan:   Informed Consent: I have reviewed the patients History and Physical, chart, labs and discussed the procedure including the risks, benefits and alternatives for the proposed anesthesia with the patient or authorized representative who has indicated his/her understanding and acceptance.   Dental advisory given  Plan Discussed with: CRNA, Surgeon and Anesthesiologist  Anesthesia Plan Comments:       Anesthesia Quick Evaluation

## 2016-12-04 NOTE — Anesthesia Procedure Notes (Signed)
Procedure Name: LMA Insertion Date/Time: 12/04/2016 10:29 AM Performed by: Burt EkURNER, Luella Gardenhire ASHLEY Pre-anesthesia Checklist: Patient identified, Emergency Drugs available, Suction available and Patient being monitored Patient Re-evaluated:Patient Re-evaluated prior to inductionOxygen Delivery Method: Circle system utilized Preoxygenation: Pre-oxygenation with 100% oxygen Intubation Type: IV induction Ventilation: Mask ventilation without difficulty LMA: LMA inserted LMA Size: 5.0 Number of attempts: 1 Placement Confirmation: positive ETCO2 and breath sounds checked- equal and bilateral Tube secured with: Tape Dental Injury: Teeth and Oropharynx as per pre-operative assessment

## 2016-12-04 NOTE — Progress Notes (Signed)
Patient's medications and NPO status [facility].    [facility].  Patient oriented to self only.  Dr. Randie Heinzain made aware of that patient had arrived and only oriented to self.  Nurse attempted to reach brother, Precious ReelJerry Plaugher for verbal consent but unable to reach.  Nurse then contacted Gwynn at [facility] for any additional contacts but was told that they only contact on file was brother Dorene SorrowJerry.  Gwynn stated that she also had reached out to [place] without success.  Nurse attempted to reach Dorene SorrowJerry again but unsuccessful.    Dr. Randie Heinzain stated that his office has also attempted multiple time to reach brother but has had no response.    Dr. Randie Heinzain and Carol AdaKristie Garrett aware of situation.  Dr. Myra GianottiBrabham consulted for case as well.

## 2016-12-04 NOTE — Anesthesia Postprocedure Evaluation (Signed)
Anesthesia Post Note  Patient: Freddie ApleyJerome D Mierzejewski  Procedure(s) Performed: Procedure(s) (LRB): RIGHT AMPUTATION ABOVE KNEE (Right)     Patient location during evaluation: PACU Anesthesia Type: General Level of consciousness: awake and sedated Pain management: pain level controlled Vital Signs Assessment: post-procedure vital signs reviewed and stable Respiratory status: spontaneous breathing Cardiovascular status: stable Postop Assessment: no signs of nausea or vomiting Anesthetic complications: no    Last Vitals:  Vitals:   12/04/16 1235 12/04/16 1250  BP: (!) 136/97 (!) 119/92  Pulse: (!) 103 (!) 104  Resp: 18 16  Temp:      Last Pain:  Vitals:   12/04/16 1250  TempSrc:   PainSc: Asleep   Pain Goal:    LLE Motor Response: Purposeful movement (12/04/16 1250) LLE Sensation: Other (Comment) (pt unable to state if legs feel numb/tingling or normal) (12/04/16 1250) RLE Motor Response: Purposeful movement (12/04/16 1250) RLE Sensation: Other (Comment) (pt unable to state if legs feel numb/ tingling or normal) (12/04/16 1250)      Eulla Kochanowski JR,JOHN Susann GivensFRANKLIN

## 2016-12-04 NOTE — Op Note (Signed)
    Patient name: Stephen Bowen MRN: [id number] DOB: September 12, 1955 Sex: male  12/04/2016 Pre-operative Diagnosis: gangrene of right foot and leg Post-operative diagnosis:  Same Surgeon:  Luanna SalkBrandon C. 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 We were unable to obtain consent from his next of kin, he had 2 physician consent and we proceeded with right above-the-knee amputation to prevent complications from his gangrenous changes.  Findings: The muscle was healthy and viable as was the bone and surrounding soft tissue. The subfascial femoral artery had a covered stent within it that was removed and there was strong antegrade bleeding through the SFA. At completion the fascial closure was without tension.    Procedure:  The patient was identified in the holding area and taken to the operating room where he was placed supine on the operating table and general anesthesia was induced. He was sterilely prepped and draped in the the right lower extremity and timeout called. We began by tracing our fishmouth incision and then used 10 blade for circumferential incision. Electrocautery was then use to transect through the muscle down to the level of the bone and the periosteum was removed. The bowel was then divided using Gigli saw. A amputation knife was then used to create a posterior flap. The stent was then removed from the SFA that was exposed SFA was trimmed back and suture ligated with the vein. Hemostasis was obtained of the wound the fascia was reapproximated with 2-0 Vicryl suture and the skin with staples. At completion are correct and dry dressing was placed. Patient tolerated procedure well without immediate complication.    Brandon C. Randie Heinzain,  MD [facility] [place] Office: [phone] Pager: [phone]

## 2016-12-04 NOTE — H&P (Signed)
   History and Physical Update  The patient was interviewed and re-examined.  The patient's previous History and Physical has been reviewed. Unfortunately family has been unreachable. Since his office visit the right leg is now mottled to the level of the knee and the entire forefoot is frankly gangrenous. Delaying this procedure would put this patient at further puts him at risk for complications including sepsis and death. He was also examined by Dr. Myra GianottiBrabham and we are in agreement. Plan to proceed with right above the knee amputation.   Brandon C. Randie Heinzain, MD [facility] [place] Office: [phone] Pager: [phone]   12/04/2016, 10:09 AM

## 2016-12-04 NOTE — Progress Notes (Signed)
Pt arrived on unit. Pt responds to voice. VSS. Call bell and phone within reach. Will continue to monitor. Bed alarm on.

## 2016-12-04 NOTE — Transfer of Care (Signed)
Immediate Anesthesia Transfer of Care Note  Patient: Stephen Bowen Grade  Procedure(s) Performed: Procedure(s): RIGHT AMPUTATION ABOVE KNEE (Right)  Patient Location: PACU  Anesthesia Type:General  Level of Consciousness: drowsy and patient cooperative  Airway & Oxygen Therapy: Patient Spontanous Breathing and Patient connected to nasal cannula oxygen  Post-op Assessment: Report given to RN, Post -op Vital signs reviewed and stable and Patient moving all extremities X 4  Post vital signs: Reviewed and stable  Last Vitals:  Vitals:   12/04/16 0938 12/04/16 1134  BP: (!) 161/95 (!) 166/102  Pulse: 96 95  Resp: 16 15  Temp: 36.5 C 36.5 C    Last Pain:  Vitals:   12/04/16 0938  TempSrc: Oral         Complications: No apparent anesthesia complications   PACU given report that Narcan 40mcg was given. Dr. Arby BarretteHatchett also made aware.

## 2016-12-05 ENCOUNTER — Encounter (HOSPITAL_COMMUNITY): Payer: Self-pay | Admitting: Vascular Surgery

## 2016-12-05 LAB — BASIC METABOLIC PANEL
ANION GAP: 13 (ref 5–15)
BUN: 11 mg/dL (ref 6–20)
CO2: 25 mmol/L (ref 22–32)
Calcium: 8.6 mg/dL — ABNORMAL LOW (ref 8.9–10.3)
Chloride: 89 mmol/L — ABNORMAL LOW (ref 101–111)
Creatinine, Ser: 0.73 mg/dL (ref 0.61–1.24)
GFR calc Af Amer: >60 mL/min (ref >60)
GLUCOSE: 119 mg/dL — AB (ref 65–99)
POTASSIUM: 4.5 mmol/L (ref 3.5–5.1)
Sodium: 127 mmol/L — ABNORMAL LOW (ref 135–145)

## 2016-12-05 LAB — CBC
HCT: 30.2 % — ABNORMAL LOW (ref 39.0–52.0)
HEMOGLOBIN: 9.6 g/dL — AB (ref 13.0–17.0)
MCH: 28.3 pg (ref 26.0–34.0)
MCHC: 31.8 g/dL (ref 30.0–36.0)
MCV: 89.1 fL (ref 78.0–100.0)
Platelets: 641 10*3/uL — ABNORMAL HIGH (ref 150–400)
RBC: 3.39 MIL/uL — ABNORMAL LOW (ref 4.22–5.81)
RDW: 13.7 % (ref 11.5–15.5)
WBC: 16.3 10*3/uL — ABNORMAL HIGH (ref 4.0–10.5)

## 2016-12-05 MED ORDER — ENOXAPARIN SODIUM 30 MG/0.3ML ~~LOC~~ SOLN
30.0000 mg | SUBCUTANEOUS | Status: DC
  Administered 2016-12-06: 30 mg via SUBCUTANEOUS
  Filled 2016-12-05: qty 0.3

## 2016-12-05 NOTE — Progress Notes (Signed)
VASCULAR SURGERY:  I was called earlier this morning about the patient's dressing on his right AKA. The nurse was concerned that there was some oozing and would prefer that I changed the dressing. The dressing was changed. There was no significant bleeding.  Stephen Ferrarihristopher Kalaya Infantino, MD, FACS Beeper [phone] Office: [phone]

## 2016-12-05 NOTE — Progress Notes (Signed)
PT Cancellation Note  Patient Details Name: Stephen ApleyJerome D Ringer MRN: [id number] DOB: 13-Jan-1956   Cancelled Treatment:    Reason Eval/Treat Not Completed: PT screened, no needs identified, will sign off. Attempted evaluation, patient unable to engage and refusing movement. OT therapist [facility] and found that patient is extensively dependent at baseline. At this time, no acute PT needs, will sign off.     Fabio AsaDevon J Perina Salvaggio 12/05/2016, 10:04 AM Charlotte Crumbevon Ayahna Solazzo, PT DPT  [phone]

## 2016-12-05 NOTE — Progress Notes (Signed)
  Progress Note    12/05/2016 8:20 AM 1 Day Post-Op  Subjective:  No complaints  Vitals:   12/04/16 2228 12/05/16 0444  BP: 127/77 135/72  Pulse: 86 83  Resp: 18 18  Temp: 97.9 F (36.6 C) 98 F (36.7 C)    Physical Exam: Awake and alert Right aka dressing cdi  CBC    Component Value Date/Time   WBC 16.3 (H) 12/05/2016 0246   RBC 3.39 (L) 12/05/2016 0246   HGB 9.6 (L) 12/05/2016 0246   HCT 30.2 (L) 12/05/2016 0246   PLT 641 (H) 12/05/2016 0246   MCV 89.1 12/05/2016 0246   MCH 28.3 12/05/2016 0246   MCHC 31.8 12/05/2016 0246   RDW 13.7 12/05/2016 0246   LYMPHSABS 1.8 08/01/2016 0213   MONOABS 0.5 08/01/2016 0213   EOSABS 0.2 08/01/2016 0213   BASOSABS 0.0 08/01/2016 0213    BMET    Component Value Date/Time   NA 127 (L) 12/05/2016 0246   K 4.5 12/05/2016 0246   CL 89 (L) 12/05/2016 0246   CO2 25 12/05/2016 0246   GLUCOSE 119 (H) 12/05/2016 0246   BUN 11 12/05/2016 0246   CREATININE 0.73 12/05/2016 0246   CREATININE 0.92 11/13/2013 1031   CALCIUM 8.6 (L) 12/05/2016 0246   GFRNONAA >60 12/05/2016 0246   GFRNONAA >89 11/13/2013 1031   GFRAA >60 12/05/2016 0246   GFRAA >89 11/13/2013 1031    INR    Component Value Date/Time   INR 1.00 09/03/2016 1621     Intake/Output Summary (Last 24 hours) at 12/05/16 0820 Last data filed at 12/04/16 1800  Gross per 24 hour  Intake           944.17 ml  Output               20 ml  Net           924.17 ml     Assessment:  61 y.o. male is s/p right aka for dry gangrene  Plan: Social work Clinical biochemistAsa/plavix/statin Dressing down tomorrow lovenox ppx   MorrisvilleBrandon C. Randie Heinzain, MD [facility] [place] Office: [phone] Pager: [phone]  12/05/2016 8:20 AM

## 2016-12-05 NOTE — Progress Notes (Signed)
OT Cancellation Note  Patient Details Name: Stephen Bowen MRN: [id number] DOB: 05-07-1956   Cancelled Treatment:    Reason Eval/Treat Not Completed: OT screened, no needs identified, will sign off. [facility]. Per SNF staff, pt is primarily bed bound, able to self feed and requires extensive to total assist for ADL. No acute OT needs, signing off.  Evern BioMayberry, Ridley Schewe Lynn 12/05/2016, 10:05 AM  [phone]

## 2016-12-06 LAB — CBC
HEMATOCRIT: 29.3 % — AB (ref 39.0–52.0)
HEMOGLOBIN: 9.2 g/dL — AB (ref 13.0–17.0)
MCH: 27.9 pg (ref 26.0–34.0)
MCHC: 31.4 g/dL (ref 30.0–36.0)
MCV: 88.8 fL (ref 78.0–100.0)
PLATELETS: 586 10*3/uL — AB (ref 150–400)
RBC: 3.3 MIL/uL — AB (ref 4.22–5.81)
RDW: 13.7 % (ref 11.5–15.5)
WBC: 11.3 10*3/uL — AB (ref 4.0–10.5)

## 2016-12-06 LAB — BASIC METABOLIC PANEL
Anion gap: 9 (ref 5–15)
BUN: 6 mg/dL (ref 6–20)
CHLORIDE: 97 mmol/L — AB (ref 101–111)
CO2: 28 mmol/L (ref 22–32)
Calcium: 8.8 mg/dL — ABNORMAL LOW (ref 8.9–10.3)
Creatinine, Ser: 0.71 mg/dL (ref 0.61–1.24)
GFR calc non Af Amer: >60 mL/min (ref >60)
Glucose, Bld: 111 mg/dL — ABNORMAL HIGH (ref 65–99)
POTASSIUM: 4.4 mmol/L (ref 3.5–5.1)
SODIUM: 134 mmol/L — AB (ref 135–145)

## 2016-12-06 MED ORDER — HYDROCODONE-ACETAMINOPHEN 5-325 MG PO TABS: 1.0000 | ORAL_TABLET | Freq: Three times a day (TID) | ORAL | 0 refills | Status: DC | PRN

## 2016-12-06 NOTE — Progress Notes (Signed)
Clinical Social Worker facilitated patient discharge including contacting patient family and facility to confirm patient discharge plans.  Clinical information faxed to facility and family agreeable with plan.  CSW arranged ambulance transport via [facility] to [facility].  RN to call [phone] (ask for RN that has station # and rm#[id number]) report prior to discharge.  Clinical Social Worker will sign off for now as social work intervention is no longer needed. Please consult us again if new need arises.  Marrianne MoodAshley Windi Toro, MSW, [employer] [phone]

## 2016-12-06 NOTE — NC FL2 (Signed)
[place] MEDICAID FL2 LEVEL OF CARE SCREENING TOOL     IDENTIFICATION  Patient Name: Stephen Bowen Birthdate: [birth year] Sex: male Admission Date (Current Location): 12/04/2016  [facility] and [place] Number:  Producer, television/film/video and Address:  [facility], [address], [place], [place] [address]      Provider Number: [id number]  Attending Physician Name and Address:  Juventino Slovak*  Relative Name and Phone Number:  Tryone Kille [phone]    Current Level of Care: Hospital Recommended Level of Care: Skilled Nursing Facility (returning to SNF) Prior Approval Number:    Date Approved/Denied:   PASRR Number: [id number]  Discharge Plan: SNF    Current Diagnoses: Patient Active Problem List   Diagnosis Date Noted  . Gangrene of right foot (HCC) 12/04/2016  . Wound of left leg 09/03/2016  . 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 Aortic atherosclerosis (HCC) 08/01/2016  . Coronary atherosclerosis of native coronary artery 08/01/2016  . Underweight 08/01/2016  . AKI (acute kidney injury) (HCC) 07/30/2016  . Limb ischemia 07/30/2016  . HLD (hyperlipidemia) 07/30/2016  . GERD (gastroesophageal reflux disease) 07/30/2016  . Acute encephalopathy 07/30/2016  . Abdominal pain 07/30/2016  . Pressure injury of skin 06/28/2016  . 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 Aphasia   . Hypotension 06/21/2016  . Stroke (HCC) 06/21/2016  . Acute MI, anterolateral wall, initial episode of care (HCC)   . 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 Preoperative cardiovascular examination   . PAD (peripheral artery disease) (HCC) 02/13/2016  . Leg pain 02/13/2016  . Hyperkalemia 02/13/2016  . Lactic acid increased 02/13/2016  . Essential hypertension 02/13/2016  . Seborrheic dermatitis 08/31/2015  . Actinic keratosis of multiple sites of head and neck 08/31/2015  . Tobacco use  disorder 08/31/2015  . Skin ulcer of scalp (HCC) 08/31/2015  . Atherosclerosis of native arteries of extremity with intermittent claudication (HCC) 07/07/2015  . Abnormal EKG 11/13/2013  . Hypertensive urgency 11/13/2013    Orientation RESPIRATION BLADDER Height & Weight     Self, Place  Normal Continent Weight: 84 lb 8 oz (38.3 kg) Height:  5\' 8"  (172.7 cm) (prior to amputation)  BEHAVIORAL SYMPTOMS/MOOD NEUROLOGICAL BOWEL NUTRITION STATUS      Continent Diet  AMBULATORY STATUS COMMUNICATION OF NEEDS Skin   Extensive Assist Verbally Normal                       Personal Care Assistance Level of Assistance  Bathing, Feeding, Dressing Bathing Assistance: Maximum assistance Feeding assistance: Independent Dressing Assistance: Maximum assistance     Functional Limitations Info  Sight, Hearing, Speech Sight Info: Adequate Hearing Info: Adequate Speech Info: Adequate    SPECIAL CARE FACTORS FREQUENCY  PT (By licensed PT), OT (By licensed OT)                    Contractures Contractures Info: Not present    Additional Factors Info  Code Status Code Status Info: Full Code             Current Medications (12/06/2016):  This is the current hospital active medication list Current Facility-Administered Medications  Medication Dose Route Frequency Provider Last Rate Last Dose  . acetaminophen (TYLENOL) tablet 325-650 mg  325-650 mg Oral Q4H PRN Rhyne, Samantha J, PA-C       Or  . acetaminophen (TYLENOL) suppository  325-650 mg  325-650 mg Rectal Q4H PRN Rhyne, Samantha J, PA-C      . alum & mag hydroxide-simeth (MAALOX/MYLANTA) 200-200-20 MG/5ML suspension 15-30 mL  15-30 mL Oral Q2H PRN Rhyne, Samantha J, PA-C      . amLODipine (NORVASC) tablet 10 mg  10 mg Oral Daily Rhyne, Samantha J, PA-C   10 mg at 12/06/16 1118  . aspirin EC tablet 81 mg  81 mg Oral Daily Maeola Harman, MD   81 mg at 12/06/16 1118  . atorvastatin (LIPITOR) tablet 40 mg  40 mg  Oral q1800 RhyneAmes Coupe, PA-C   40 mg at 12/05/16 1814  . bisacodyl (DULCOLAX) EC tablet 5 mg  5 mg Oral Daily PRN Rhyne, Samantha J, PA-C      . bisacodyl (DULCOLAX) suppository 10 mg  10 mg Rectal Daily PRN Rhyne, Samantha J, PA-C      . clopidogrel (PLAVIX) tablet 75 mg  75 mg Oral Daily Rhyne, Samantha J, PA-C   75 mg at 12/06/16 1119  . docusate sodium (COLACE) capsule 100 mg  100 mg Oral Daily Rhyne, Samantha J, PA-C   100 mg at 12/06/16 1118  . enoxaparin (LOVENOX) injection 30 mg  30 mg Subcutaneous Q24H Maeola Harman, MD   30 mg at 12/06/16 1610  . ferrous sulfate tablet 325 mg  325 mg Oral Q breakfast Dara Lords, PA-C   325 mg at 12/06/16 9604  . folic acid (FOLVITE) tablet 1 mg  1 mg Oral Daily Rhyne, Samantha J, PA-C   1 mg at 12/06/16 1119  . guaiFENesin-dextromethorphan (ROBITUSSIN DM) 100-10 MG/5ML syrup 15 mL  15 mL Oral Q4H PRN Rhyne, Samantha J, PA-C      . hydrALAZINE (APRESOLINE) injection 5 mg  5 mg Intravenous Q20 Min PRN Rhyne, Samantha J, PA-C      . HYDROcodone-acetaminophen (NORCO/VICODIN) 5-325 MG per tablet 1 tablet  1 tablet Oral Q8H PRN Dara Lords, PA-C   1 tablet at 12/06/16 5409  . labetalol (NORMODYNE,TRANDATE) injection 10 mg  10 mg Intravenous Q10 min PRN Rhyne, Samantha J, PA-C      . lactulose (CHRONULAC) 10 GM/15ML solution 20 g  20 g Oral BID Rhyne, Samantha J, PA-C   20 g at 12/06/16 1115  . magnesium sulfate IVPB 2 g 50 mL  2 g Intravenous Daily PRN Rhyne, Samantha J, PA-C      . metoprolol tartrate (LOPRESSOR) injection 2-5 mg  2-5 mg Intravenous Q2H PRN Rhyne, Samantha J, PA-C      . metoprolol tartrate (LOPRESSOR) tablet 125 mg  125 mg Oral BID Dara Lords, PA-C   125 mg at 12/06/16 1119  . mirtazapine (REMERON) tablet 7.5 mg  7.5 mg Oral QHS Rhyne, Samantha J, PA-C   7.5 mg at 12/05/16 2237  . morphine 2 MG/ML injection 1 mg  1 mg Intravenous Q2H PRN Rhyne, Samantha J, PA-C   1 mg at 12/06/16 1127  . ondansetron  (ZOFRAN) injection 4 mg  4 mg Intravenous Q6H PRN Rhyne, Samantha J, PA-C      . pantoprazole (PROTONIX) EC tablet 20 mg  20 mg Oral Daily Rhyne, Samantha J, PA-C   20 mg at 12/06/16 1118  . phenol (CHLORASEPTIC) mouth spray 1 spray  1 spray Mouth/Throat PRN Rhyne, Samantha J, PA-C      . polyethylene glycol (MIRALAX / GLYCOLAX) packet 17 g  17 g Oral Daily PRN Rhyne, Ames Coupe, PA-C      .  potassium chloride SA (K-DUR,KLOR-CON) CR tablet 20-40 mEq  20-40 mEq Oral Daily PRN Rhyne, Samantha J, PA-C      . spironolactone (ALDACTONE) tablet 25 mg  25 mg Oral Daily Rhyne, Samantha J, PA-C   25 mg at 12/06/16 1118     Discharge Medications: Please see discharge summary for a list of discharge medications.  Relevant Imaging Results:  Relevant Lab Results:   Additional Information SS#[id number]  Althea CharonAshley C Rylee Nuzum, LCSW

## 2016-12-06 NOTE — Clinical Social Work Placement (Signed)
   CLINICAL SOCIAL WORK PLACEMENT  NOTE  Date:  12/06/2016  Patient Details  Name: Stephen Bowen MRN: [id number] Date of Birth: 05-Oct-1955  Clinical Social Work is seeking post-discharge placement for this patient at the Skilled  Nursing Facility level of care (*CSW will initial, date and re-position this form in  chart as items are completed):  No   Patient/family provided with [facility] Clinical Social Work Department's list of facilities offering this level of care within the geographic area requested by the patient (or if unable, by the patient's family).  No   Patient/family informed of their freedom to choose among providers that offer the needed level of care, that participate in Medicare, Medicaid or managed care program needed by the patient, have an available bed and are willing to accept the patient.  No   Patient/family informed of [facility]'s ownership interest in [facility] and [facility], as well as of the fact that they are under no obligation to receive care at these facilities.  PASRR submitted to EDS on       PASRR number received on       Existing PASRR number confirmed on 12/06/16     FL2 transmitted to all facilities in geographic area requested by pt/family on       FL2 transmitted to all facilities within larger geographic area on       Patient informed that his/her managed care company has contracts with or will negotiate with certain facilities, including the following:        No   Patient/family informed of bed offers received.  [facility] bed at [facility]     Physician recommends and patient chooses bed at      Patient to be [facility] to [facility] on 12/06/16.  Patient to be transferred to facility by [facility]     Patient family notified on 12/06/16 of transfer.  Name of family member notified:  attempted to call jerry Conrad     PHYSICIAN       Additional Comment:     _______________________________________________ Althea CharonAshley C Jeramyah Goodpasture, LCSW 12/06/2016, 1:52 PM

## 2016-12-06 NOTE — Progress Notes (Signed)
Clinical Social Worker was consulted to assist patient in returning to his SNF facility. CSW reached out to the coordinator of [facility] and they stated they are able to take patient back. CSW will arrange transport to [facility].  Marrianne MoodAshley Jumana Paccione, MSW,  [employer]CSWA [phone]

## 2016-12-06 NOTE — Discharge Summary (Signed)
[facility] Discharge Summary   Patient ID:  Stephen Bowen MRN: [id number] DOB/AGE: [birth year] 61 y.o.  Admit date: 12/04/2016 Discharge date: 12/06/2016 Date of Surgery: 12/04/2016 Surgeon: Surgeon(s): Maeola Harmanain, Brandon Christopher, MD  Admission Diagnosis: nonviable tissue   Discharge Diagnoses:  nonviable tissue   Secondary Diagnoses: Past Medical History:  Diagnosis Date  . Coronary artery disease   . Encephalopathy acute 07/2016  . HTN (hypertension)   . Hyperlipemia   . Myocardial infarction (HCC)   . Non-healing surgical wound    left AKA  . Noncompliance   . PAD (peripheral artery disease) (HCC)    a.  s/p left femoral to PT artery bypass with propaten on 02/16/16.    Procedure(s): RIGHT AMPUTATION ABOVE KNEE  Discharged Condition: stable  HPI: Stephen ApleyJerome D Carillo is a 61 y.o. male with history of bilateral lower extremity peripheral arterial disease. Chiefly he had left lower extremity revascularization requiring bypass ultimately failed and revision was, located by stroke and myocardial infarction. He ultimately had a dictation of the left lower extremity above-knee and then had wound complications. He now presents with coolness and mottling of his right foot. 12/04/2016 He has a necrotic fore foot and mottling up to th knee.  The leg is not salvagable.  He needs a above knee amputation.  Delaying this could lead to sepsis from his gangrene to the leg.   Hospital Course:  Stephen ApleyJerome D Rhinesmith is a 61 y.o. male is S/P Right Procedure(s): RIGHT AMPUTATION ABOVE KNEE Right aka site with staples, minimal blood staining on dressing Left aka site has healed previous debridement site, bone is palpable on skin.  Continue aspirin and Plavix daily.     Significant Diagnostic Studies: CBC Lab Results  Component Value Date   WBC 11.3 (H) 12/06/2016   HGB 9.2 (L) 12/06/2016   HCT 29.3 (L) 12/06/2016   MCV 88.8 12/06/2016   PLT 586 (H) 12/06/2016     BMET    Component Value Date/Time   NA 134 (L) 12/06/2016 0339   K 4.4 12/06/2016 0339   CL 97 (L) 12/06/2016 0339   CO2 28 12/06/2016 0339   GLUCOSE 111 (H) 12/06/2016 0339   BUN 6 12/06/2016 0339   CREATININE 0.71 12/06/2016 0339   CREATININE 0.92 11/13/2013 1031   CALCIUM 8.8 (L) 12/06/2016 0339   GFRNONAA >60 12/06/2016 0339   GFRNONAA >89 11/13/2013 1031   GFRAA >60 12/06/2016 0339   GFRAA >89 11/13/2013 1031   COAG Lab Results  Component Value Date   INR 1.00 09/03/2016   INR 1.21 08/01/2016   INR 1.13 07/31/2016     Disposition:  Discharge to :Skilled nursing facility Discharge Instructions    Activity as tolerated - No restrictions    Complete by:  As directed    Call MD for:  redness, tenderness, or signs of infection (pain, swelling, bleeding, redness, odor or green/yellow discharge around incision site)    Complete by:  As directed    Call MD for:  severe or increased pain, loss or decreased feeling  in affected limb(s)    Complete by:  As directed    Call MD for:  temperature >100.5    Complete by:  As directed    Discharge instructions    Complete by:  As directed    Stump care dressing change dressing daily   Resume previous diet    Complete by:  As directed      Allergies as of 12/06/2016  Reactions   No Known Allergies       Medication List    TAKE these medications   amLODipine 10 MG tablet Commonly known as:  NORVASC Take 1 tablet (10 mg total) by mouth daily.   aspirin 81 MG EC tablet Take 1 tablet (81 mg total) by mouth daily.   atorvastatin 40 MG tablet Commonly known as:  LIPITOR Take 1 tablet (40 mg total) by mouth daily at 6 PM.   bisacodyl 5 MG EC tablet Commonly known as:  DULCOLAX Take 1 tablet (5 mg total) by mouth daily as needed for moderate constipation.   clopidogrel 75 MG tablet Commonly known as:  PLAVIX Take 1 tablet (75 mg total) by mouth daily.   docusate sodium 100 MG capsule Commonly known as:   COLACE Take 1 capsule (100 mg total) by mouth daily.   ferrous sulfate 325 (65 FE) MG tablet Take 325 mg by mouth daily with breakfast.   folic acid 1 MG tablet Commonly known as:  FOLVITE Take 1 tablet (1 mg total) by mouth daily.   HYDROcodone-acetaminophen 5-325 MG tablet Commonly known as:  NORCO/VICODIN Take 1 tablet by mouth every 8 (eight) hours as needed for moderate pain. What changed:  Another medication with the same name was added. Make sure you understand how and when to take each.   HYDROcodone-acetaminophen 5-325 MG tablet Commonly known as:  NORCO/VICODIN Take 1 tablet by mouth every 8 (eight) hours as needed for moderate pain. What changed:  You were already taking a medication with the same name, and this prescription was added. Make sure you understand how and when to take each.   lactulose 10 GM/15ML solution Commonly known as:  CHRONULAC Take 30 mLs (20 g total) by mouth 2 (two) times daily.   metoprolol tartrate 25 MG tablet Commonly known as:  LOPRESSOR Take 5 tablets (125 mg total) by mouth 2 (two) times daily.   mirtazapine 7.5 MG tablet Commonly known as:  REMERON Take 7.5 mg by mouth at bedtime.   pantoprazole 20 MG tablet Commonly known as:  PROTONIX Take 20 mg by mouth daily.   SELENIUM SULFIDE EX Apply 1 application topically daily. Selenium Sulfide lotion 2.5%. Apply to face topically daily   spironolactone 25 MG tablet Commonly known as:  ALDACTONE Take 1 tablet (25 mg total) by mouth daily.      Verbal and written Discharge instructions given to the patient. Wound care per Discharge AVS   Signed: Clinton Gallant [facility] 12/06/2016, 7:52 AM

## 2016-12-06 NOTE — Progress Notes (Signed)
  Progress Note    12/06/2016 7:38 AM 2 Days Post-Op  Subjective:  No complaints this morning  Vitals:   12/05/16 2040 12/06/16 0523  BP: 127/82 139/77  Pulse: 100 85  Resp: 18 17  Temp: 98 F (36.7 C) 98.1 F (36.7 C)    Physical Exam: Awake and alert  Right aka site with staples, minimal blood staining on dressing Left aka site has healed previous debridement site, bone is palpable on skin CBC    Component Value Date/Time   WBC 11.3 (H) 12/06/2016 0339   RBC 3.30 (L) 12/06/2016 0339   HGB 9.2 (L) 12/06/2016 0339   HCT 29.3 (L) 12/06/2016 0339   PLT 586 (H) 12/06/2016 0339   MCV 88.8 12/06/2016 0339   MCH 27.9 12/06/2016 0339   MCHC 31.4 12/06/2016 0339   RDW 13.7 12/06/2016 0339   LYMPHSABS 1.8 08/01/2016 0213   MONOABS 0.5 08/01/2016 0213   EOSABS 0.2 08/01/2016 0213   BASOSABS 0.0 08/01/2016 0213    BMET    Component Value Date/Time   NA 134 (L) 12/06/2016 0339   K 4.4 12/06/2016 0339   CL 97 (L) 12/06/2016 0339   CO2 28 12/06/2016 0339   GLUCOSE 111 (H) 12/06/2016 0339   BUN 6 12/06/2016 0339   CREATININE 0.71 12/06/2016 0339   CREATININE 0.92 11/13/2013 1031   CALCIUM 8.8 (L) 12/06/2016 0339   GFRNONAA >60 12/06/2016 0339   GFRNONAA >89 11/13/2013 1031   GFRAA >60 12/06/2016 0339   GFRAA >89 11/13/2013 1031    INR    Component Value Date/Time   INR 1.00 09/03/2016 1621     Intake/Output Summary (Last 24 hours) at 12/06/16 0738 Last data filed at 12/06/16 [id number]  Gross per 24 hour  Intake              100 ml  Output             2175 ml  Net            -2075 ml     Assessment:  61 y.o. male is s/p right aka 2 Days Post-Op  Plan: dispo planning to snf Asa/plavix Need to protect left aka site as well lovenox   Rionna Feltes C. Randie Heinzain, MD [facility] [place] Office: [phone] Pager: [phone]  12/06/2016 7:38 AM

## 2016-12-06 NOTE — Progress Notes (Signed)
12/06/2016 2:30 PM Pt D/C [facility]. Kathryne HitchAllen, Cythina Mickelsen C

## 2016-12-06 NOTE — Plan of Care (Signed)
Problem: Safety: Goal: Ability to remain free from injury will improve Outcome: Progressing Patient verbalized understanding of calling for assistance when needed.  Call bell within reach, no attempts to get out of bed attempted.  Patient progressing towards goal.

## 2016-12-06 NOTE — Care Management Note (Signed)
Case Management Note Donn PieriniKristi Lindi Abram RN, BSN Unit 2W-Case Manager [phone]  Patient Details  Name: Stephen ApleyJerome D Flahive MRN: [id number] Date of Birth: [birth year]  Subjective/Objective:  Pt admitted with non healing wound- s/p right AKS                  Action/Plan: PTA [facility]- CSW following for anticipated [facility]-   Expected Discharge Date:  12/06/16               Expected Discharge Plan:  Skilled Nursing Facility  In-House Referral:  Clinical Social Work  Discharge planning Services  CM Consult  Post Acute Care Choice:  NA Choice offered to:  NA  DME Arranged:    DME Agency:     HH Arranged:    HH Agency:     Status of Service:  Completed, signed off  If discussed at [employer] Length of Stay Meetings, dates discussed:    Discharge Disposition: skilled facility   Additional Comments:  Darrold SpanWebster, Shawnte Demarest Hall, RN 12/06/2016, 11:47 AM

## 2016-12-24 ENCOUNTER — Encounter: Payer: Self-pay | Admitting: Family

## 2017-01-04 ENCOUNTER — Inpatient Hospital Stay (HOSPITAL_COMMUNITY): Admission: RE | Admit: 2017-01-04 | Payer: Medicaid Other | Source: Ambulatory Visit

## 2017-01-04 ENCOUNTER — Ambulatory Visit: Payer: Medicaid Other | Admitting: Family

## 2017-09-11 ENCOUNTER — Telehealth: Payer: Self-pay | Admitting: Vascular Surgery

## 2017-09-11 NOTE — Telephone Encounter (Signed)
[facility] [phone] called our office on 09/05/17  to schedule an appt for [facility] regarding an exposed bone on the L AKA we had preformed on  12/04/16.  Sched him an appt with Dr. Randie Heinzain on 09/13/17.  Pt's POA Stephen Bowen passed away on 10/02/16 as noted in 11/21/17 telephone encounter. At the time the social worker Stephen Bowen informed us that the pt's other brother Stephen Bowen was the current POA. This is also documented in the 11/21/17 telephone encounter.  It has been documented that Stephen Bowen is unreliable in returning calls.  Received an updated phone number for Stephen Bowen [phone]. Left him a message 09/11/17 morning for him to call us back regarding his brother and POA documentation.  We do not have any POA documentation on file.  Latasha from medical records at [facility] called back and told us that Stephen Bowen does not have a POA and is responsible for himself.  Our clinical team has stated that the pt is not competent, and needs a POA or to have the pt be a ward of the state or to have [facility] have guardianship. They have stated that they explained this [facility].  I called [facility] back and asked for the social worker. Stephen Bowen no longer works at [facility]. Spoke to the new social worker Stephen Bowen and she stated that Stephen Bowen is his own responsible party and that Stephen Bowen is not the POA, just the emergency contact.  Left a second message for Stephen ReelJerry Homesley to call our office back at 1:29 pm.

## 2017-09-13 ENCOUNTER — Ambulatory Visit (INDEPENDENT_AMBULATORY_CARE_PROVIDER_SITE_OTHER): Payer: Medicaid Other | Admitting: Vascular Surgery

## 2017-09-13 ENCOUNTER — Other Ambulatory Visit: Payer: Self-pay

## 2017-09-13 ENCOUNTER — Encounter: Payer: Self-pay | Admitting: Vascular Surgery

## 2017-09-13 VITALS — BP 125/87 | HR 84 | Temp 98.0°F | Resp 20 | Ht 68.0 in | Wt 84.5 lb

## 2017-09-13 DIAGNOSIS — T8789 Other complications of amputation stump: Secondary | ICD-10-CM

## 2017-09-13 NOTE — Progress Notes (Signed)
Patient ID: Stephen Bowen, male   DOB: 1956-03-18, 62 y.o.   MRN: [id number]  Reason for Consult: Follow-up (eval exposed bone L AKA)   Referred by No ref. provider found  Subjective:     HPI:  Stephen Bowen is a 62 y.o. male well-known to me with bilateral lower extremity above-knee amputations.  Most recently he had a right side done this is now healed well.  He previously had a left side done and this needed wound VAC for secondary healing.  He now presents with concern for exposed bone.  He denies any pain.  He remains in a nursing home and [facility].  His brother is along with him today states that he is the one that signed consents.  Past Medical History:  Diagnosis Date  . Coronary artery disease   . Encephalopathy acute 07/2016  . HTN (hypertension)   . Hyperlipemia   . Myocardial infarction (HCC)   . Non-healing surgical wound    left AKA  . Noncompliance   . PAD (peripheral artery disease) (HCC)    a.  s/p left femoral to PT artery bypass with propaten on 02/16/16.   Family History  Problem Relation Age of Onset  . Cancer Mother        deceased in 70s   . Heart attack Mother        diseased  . Heart disease Father   . Stroke Sister        age 54s  . Heart disease Brother        pacemaker in his 52s   Past Surgical History:  Procedure Laterality Date  . ABDOMINAL AORTOGRAM W/LOWER EXTREMITY N/A 08/02/2016   Procedure: Abdominal Aortogram w/Lower Extremity;  Surgeon: Maeola Harman, MD;  Location: [facility] INVASIVE CV LAB;  Service: Cardiovascular;  Laterality: N/A;  . ABOVE KNEE LEG AMPUTATION Right 12/04/2016  . AMPUTATION Left 06/25/2016   Procedure: AMPUTATION ABOVE KNEE;  Surgeon: Nada Libman, MD;  Location: [facility] OR;  Service: Vascular;  Laterality: Left;  . AMPUTATION Right 12/04/2016   Procedure: RIGHT AMPUTATION ABOVE KNEE;  Surgeon: Maeola Harman, MD;  Location: [facility] OR;  Service: Vascular;  Laterality: Right;  . BYPASS GRAFT POPLITEAL TO  TIBIAL Left 06/21/2016   Procedure: BYPASS GRAFT POPLITEAL TO TIBIAL USING GORE PROPATEN GRAFT;  Surgeon: Maeola Harman, MD;  Location: [facility] OR;  Service: Vascular;  Laterality: Left;  . EMBOLECTOMY Left 06/21/2016   Procedure: EMBOLECTOMY left lower extremity.;  Surgeon: Maeola Harman, MD;  Location: [facility] OR;  Service: Vascular;  Laterality: Left;  . ENDARTERECTOMY FEMORAL Left 02/16/2016   Procedure: LEFT FEMORAL ENDARTERECTOMY;  Surgeon: Maeola Harman, MD;  Location: [facility] OR;  Service: Vascular;  Laterality: Left;  . FEMORAL BYPASS  02/16/2016   LEFT FEMORAL-POSTERIOR TIBIAL ARTERY BYPASS  WITH PROPATEN 6 MM X 80 CM VASCULAR RING GRAFT (Left)  . FEMORAL-TIBIAL BYPASS GRAFT Left 02/16/2016   Procedure: LEFT FEMORAL-POSTERIOR TIBIAL ARTERY BYPASS  WITH PROPATEN 6 MM X 80 CM VASCULAR RING GRAFT;  Surgeon: Maeola Harman, MD;  Location: [facility] OR;  Service: Vascular;  Laterality: Left;  . FRACTURE SURGERY    . I&D EXTREMITY Left 09/03/2016   Procedure: IRRIGATION AND DEBRIDEMENT EXTREMITY - LEFT AKA;  Surgeon: Maeola Harman, MD;  Location: [facility] OR;  Service: Vascular;  Laterality: Left;  . PATCH ANGIOPLASTY Left 02/16/2016   Procedure: PATCH ANGIOPLASTY WITH XENOSURE BIOLOGIC PATCH 1 CM X 6 CM;  Surgeon: Apolinar Junes  Addison Bailey, MD;  Location: [facility] OR;  Service: Vascular;  Laterality: Left;  . PERIPHERAL VASCULAR BALLOON ANGIOPLASTY Right 08/02/2016   Procedure: Peripheral Vascular Balloon Angioplasty;  Surgeon: Maeola Harman, MD;  Location: [facility] INVASIVE CV LAB;  Service: Cardiovascular;  Laterality: Right;  peroneal artery  . PERIPHERAL VASCULAR CATHETERIZATION N/A 07/07/2015   Procedure: Abdominal Aortogram w/Lower Extremity;  Surgeon: Fransisco Hertz, MD;  Location: [facility] INVASIVE CV LAB;  Service: Cardiovascular;  Laterality: N/A;  . PERIPHERAL VASCULAR CATHETERIZATION N/A 02/15/2016   Procedure: Abdominal Aortogram w/Lower Extremity;  Surgeon: Maeola Harman, MD;  Location: [facility] INVASIVE CV LAB;  Service: Cardiovascular;  Laterality: N/A;  . PERIPHERAL VASCULAR CATHETERIZATION Bilateral 06/20/2016   Procedure: Lower Extremity Angiography;  Surgeon: Maeola Harman, MD;  Location: [facility] INVASIVE CV LAB;  Service: Cardiovascular;  Laterality: Bilateral;  limited runoff on rt leg thrombolysis lt leg bypass graft  . PERIPHERAL VASCULAR INTERVENTION Right 08/02/2016   Procedure: Peripheral Vascular Intervention;  Surgeon: Maeola Harman, MD;  Location: [facility] INVASIVE CV LAB;  Service: Cardiovascular;  Laterality: Right;  Femoral , popliteal  . 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    Short Social History:  Social History   Tobacco Use  . Smoking status: Former Smoker    Packs/day: 3.00    Years: 40.00    Pack years: 120.00  . Smokeless tobacco: Never Used  . Tobacco comment: cutting back amount he is buying  Substance Use Topics  . Alcohol use: Yes    Alcohol/week: 8.4 oz    Types: 14 Cans of beer per week    Comment: Drinks a 40 oz "tallboy" daily   [id number]  I quitdrinking    Allergies  Allergen Reactions  . No Known Allergies     Current Outpatient Medications  Medication Sig Dispense Refill  . amLODipine (NORVASC) 10 MG tablet Take 1 tablet (10 mg total) by mouth daily.    Marland Kitchen aspirin EC 81 MG EC tablet Take 1 tablet (81 mg total) by mouth daily.    Marland Kitchen atorvastatin (LIPITOR) 40 MG tablet Take 1 tablet (40 mg total) by mouth daily at 6 PM. 30 tablet 0  . bisacodyl (DULCOLAX) 5 MG EC tablet Take 1 tablet (5 mg total) by mouth daily as needed for moderate constipation. 30 tablet 0  . clopidogrel (PLAVIX) 75 MG tablet Take 1 tablet (75 mg total) by mouth daily.    Marland Kitchen docusate sodium (COLACE) 100 MG capsule Take 1 capsule (100 mg total) by mouth daily. 10 capsule 0  . ferrous sulfate 325 (65 FE) MG tablet Take 325 mg by mouth daily with breakfast.    . folic acid (FOLVITE)  1 MG tablet Take 1 tablet (1 mg total) by mouth daily.    Marland Kitchen HYDROcodone-acetaminophen (NORCO/VICODIN) 5-325 MG tablet Take 1 tablet by mouth every 8 (eight) hours as needed for moderate pain.    Marland Kitchen HYDROcodone-acetaminophen (NORCO/VICODIN) 5-325 MG tablet Take 1 tablet by mouth every 8 (eight) hours as needed for moderate pain. 30 tablet 0  . lactulose (CHRONULAC) 10 GM/15ML solution Take 30 mLs (20 g total) by mouth 2 (two) times daily. 240 mL 0  . metoprolol tartrate (LOPRESSOR) 25 MG tablet Take 5 tablets (125 mg total) by mouth 2 (two) times daily.    . mirtazapine (REMERON) 7.5 MG tablet Take 7.5 mg by mouth at bedtime.    . pantoprazole (PROTONIX) 20 MG tablet  Take 20 mg by mouth daily.    . SELENIUM SULFIDE EX Apply 1 application topically daily. Selenium Sulfide lotion 2.5%. Apply to face topically daily    . spironolactone (ALDACTONE) 25 MG tablet Take 1 tablet (25 mg total) by mouth daily.     No current facility-administered medications for this visit.     Review of Systems  Constitutional:  Constitutional negative. Eyes: Eyes negative.  Respiratory: Respiratory negative.  Cardiovascular: Cardiovascular negative.  GI: Gastrointestinal negative.  Musculoskeletal: Musculoskeletal negative.  Skin: Skin negative.  Neurological: Neurological negative. Hematologic: Hematologic/lymphatic negative.  Psychiatric: Psychiatric negative.        Objective:  Objective   Vitals:   09/13/17 0902  BP: 125/87  Pulse: 84  Resp: 20  Temp: 98 F (36.7 C)  TempSrc: Oral  SpO2: 95%  Weight: 84 lb 8 oz (38.3 kg)  Height: 5\' 8"  (1.727 m)   Body mass index is 12.85 kg/m.  Physical Exam  Constitutional: He appears well-developed.  HENT:  Head: Normocephalic.  Eyes: Pupils are equal, round, and reactive to light.  Neck: Normal range of motion.  Cardiovascular: Normal rate.  Pulses:      Radial pulses are 2+ on the right side, and 2+ on the left side.  Pulmonary/Chest: Effort  normal.  Musculoskeletal:  Bone appears to be exposed left aka  Neurological: He is alert.  Skin: Skin is warm and dry.  Psychiatric: He has a normal mood and affect. His behavior is normal. Judgment and thought content normal.    Data: No new studies     Assessment/Plan:     62 year old male previous bilateral above-knee amputations now presents with what appears to be exposed bone through his left amputation site.  This had healed by secondary intention with palpable wound VAC and I am unsure if he has had any trauma or not but he does not recall any in the nursing home staff cannot tell me either.  Either way we need to revise his left leg residual limb remove the bone back several centimeters.  I have spoken with the brother he demonstrates good understanding.  Will need to figure out who in fact signed consent and then we can get him set up for surgery in the near future.     Maeola HarmanBrandon Christopher Manasa Spease MD [facility]

## 2017-09-27 ENCOUNTER — Other Ambulatory Visit: Payer: Self-pay | Admitting: *Deleted

## 2017-10-04 NOTE — Progress Notes (Addendum)
spoke with Joyce GrossKay at Dr Darcella Cheshireain's office states to continue taking Plavix

## 2017-10-07 NOTE — Anesthesia Preprocedure Evaluation (Addendum)
Anesthesia Evaluation  Patient identified by MRN, date of birth, ID band Patient confused    Reviewed: Allergy & Precautions, NPO status , Patient's Chart, lab work & pertinent test results, reviewed documented beta blocker date and time   Airway Mallampati: II  TM Distance: >3 FB     Dental  (+) Poor Dentition, Missing, Dental Advisory Given   Pulmonary Current Smoker, former smoker,    Pulmonary exam normal breath sounds clear to auscultation       Cardiovascular hypertension, Pt. on medications and Pt. on home beta blockers + CAD, + Past MI and + Peripheral Vascular Disease  Normal cardiovascular exam     Neuro/Psych CVA    GI/Hepatic Neg liver ROS, GERD  ,  Endo/Other  negative endocrine ROS  Renal/GU Renal disease     Musculoskeletal   Abdominal (+) scaphoid   Peds  Hematology   Anesthesia Other Findings ECHO COMPLETE WO IMAGE ENHANCING AGENT  Order# [id number]  Reading physician: Jake BatheSkains, Mark C, MD Ordering physician: Estrella Deedsunn, Dayna N, PA-C Study date: 06/23/16 Study Result   Result status: Final result                             *[place]*                   *[facility]*                         [address]                        [place], [place] [address]                            [phone]  ------------------------------------------------------------------- Transthoracic Echocardiography  Patient:    Stephen Bowen, Jayant D MR #:       [id number] Study Date: 06/23/2016 Gender:     M Age:        62 Height:     172.7 cm Weight:     55.5 kg BSA:        1.62 m^2 Pt. Status: Room:       2S08C   Debria GarretDERING     Dunn, Dayna N  REFERRING    Dunn, Dayna N  ADMITTING    Durene CalBrabham, Wells  ATTENDING    Durene CalBrabham, Wells  PERFORMING   [facility], Inpatient  SONOGRAPHER  [place]al[place]  cc:  ------------------------------------------------------------------- LV EF: 60% -   65%     Reproductive/Obstetrics                            Anesthesia Physical  Anesthesia Plan  ASA: III  Anesthesia Plan: General   Post-op Pain Management:    Induction: Intravenous  PONV Risk Score and Plan: 3 and Ondansetron, Dexamethasone and Diphenhydramine  Airway Management Planned: LMA  Additional Equipment:   Intra-op Plan:   Post-operative Plan: Extubation in OR  Informed Consent: I have reviewed the patients History and Physical, chart, labs and discussed the procedure including the risks, benefits and alternatives for the proposed anesthesia with the patient or authorized representative who has indicated his/her understanding and acceptance.     Plan Discussed with: CRNA, Anesthesiologist and Surgeon  Anesthesia Plan Comments:        Anesthesia Quick  Evaluation

## 2017-10-07 NOTE — Pre-Procedure Instructions (Addendum)
    Freddie ApleyJerome D Wherley  10/07/2017    Your procedure is scheduled on Tuesday, April 16.  [facility] at [facility]:30 A.M.   Call this number if you have problems or questions the morning of surgery:  [phone]    Special instructions:  Please send current medication record with the medication that were administered documention.   Remember:  Do not eat food or drink liquids after midnight .   Take these medicines the morning of surgery with A SIP OF WATER :  amLODipine (NORVASC) metoprolol tartrate (LOPRESSOR)  Aspirin Plavix  If needed:   oxyCODONE-acetaminophen (PERCOCET/ROXICET)- if patient can tolerate on an empty stomach   Do not wear jewelry, make-up or nail polish.  Do not wear lotions, powders, or perfumes, or deodorant.  Do not shave 48 hours prior to surgery.  Men may shave face and neck.  Do not bring valuables to the hospital.  [facility] is not responsible for any belongings or valuables.

## 2017-10-08 ENCOUNTER — Encounter (HOSPITAL_COMMUNITY)
Admission: RE | Disposition: A | Discharge status: Skilled Nursing Facility | Payer: Self-pay | Source: Ambulatory Visit | Attending: Vascular Surgery

## 2017-10-08 ENCOUNTER — Inpatient Hospital Stay (HOSPITAL_COMMUNITY): Payer: Medicaid Other | Admitting: Anesthesiology

## 2017-10-08 ENCOUNTER — Inpatient Hospital Stay (HOSPITAL_COMMUNITY)
Admission: RE | Admit: 2017-10-08 | Discharge: 2017-10-10 | DRG: 476 | Disposition: A | Discharge status: Skilled Nursing Facility | Payer: Medicaid Other | Source: Ambulatory Visit | Attending: Vascular Surgery | Admitting: Vascular Surgery

## 2017-10-08 ENCOUNTER — Encounter (HOSPITAL_COMMUNITY): Payer: Self-pay | Admitting: *Deleted

## 2017-10-08 ENCOUNTER — Other Ambulatory Visit: Payer: Self-pay

## 2017-10-08 ENCOUNTER — Telehealth: Payer: Self-pay | Admitting: Vascular Surgery

## 2017-10-08 DIAGNOSIS — Z79899 Other long term (current) drug therapy: Secondary | ICD-10-CM | POA: Diagnosis not present

## 2017-10-08 DIAGNOSIS — I739 Peripheral vascular disease, unspecified: Secondary | ICD-10-CM | POA: Diagnosis present

## 2017-10-08 DIAGNOSIS — Z720 Tobacco use: Secondary | ICD-10-CM

## 2017-10-08 DIAGNOSIS — I10 Essential (primary) hypertension: Secondary | ICD-10-CM | POA: Diagnosis present

## 2017-10-08 DIAGNOSIS — T8789 Other complications of amputation stump: Secondary | ICD-10-CM | POA: Diagnosis present

## 2017-10-08 DIAGNOSIS — Z9119 Patient's noncompliance with other medical treatment and regimen: Secondary | ICD-10-CM

## 2017-10-08 DIAGNOSIS — Z89612 Acquired absence of left leg above knee: Secondary | ICD-10-CM

## 2017-10-08 DIAGNOSIS — T8781 Dehiscence of amputation stump: Secondary | ICD-10-CM | POA: Diagnosis not present

## 2017-10-08 DIAGNOSIS — I2510 Atherosclerotic heart disease of native coronary artery without angina pectoris: Secondary | ICD-10-CM | POA: Diagnosis present

## 2017-10-08 DIAGNOSIS — Z95820 Peripheral vascular angioplasty status with implants and grafts: Secondary | ICD-10-CM

## 2017-10-08 DIAGNOSIS — K219 Gastro-esophageal reflux disease without esophagitis: Secondary | ICD-10-CM | POA: Diagnosis present

## 2017-10-08 DIAGNOSIS — Z89611 Acquired absence of right leg above knee: Secondary | ICD-10-CM | POA: Diagnosis not present

## 2017-10-08 DIAGNOSIS — R402414 Glasgow coma scale score 13-15, 24 hours or more after hospital admission: Secondary | ICD-10-CM | POA: Diagnosis not present

## 2017-10-08 DIAGNOSIS — Z86718 Personal history of other venous thrombosis and embolism: Secondary | ICD-10-CM

## 2017-10-08 DIAGNOSIS — Z7902 Long term (current) use of antithrombotics/antiplatelets: Secondary | ICD-10-CM | POA: Diagnosis not present

## 2017-10-08 DIAGNOSIS — E785 Hyperlipidemia, unspecified: Secondary | ICD-10-CM | POA: Diagnosis present

## 2017-10-08 DIAGNOSIS — Y838 Other surgical procedures as the cause of abnormal reaction of the patient, or of later complication, without mention of misadventure at the time of the procedure: Secondary | ICD-10-CM | POA: Diagnosis present

## 2017-10-08 DIAGNOSIS — Z7982 Long term (current) use of aspirin: Secondary | ICD-10-CM

## 2017-10-08 DIAGNOSIS — I252 Old myocardial infarction: Secondary | ICD-10-CM

## 2017-10-08 HISTORY — PX: AMPUTATION: SHX166

## 2017-10-08 LAB — COMPREHENSIVE METABOLIC PANEL
ALBUMIN: 4 g/dL (ref 3.5–5.0)
ALT: 15 U/L — AB (ref 17–63)
AST: 13 U/L — AB (ref 15–41)
Alkaline Phosphatase: 79 U/L (ref 38–126)
Anion gap: 11 (ref 5–15)
BUN: 14 mg/dL (ref 6–20)
CHLORIDE: 100 mmol/L — AB (ref 101–111)
CO2: 26 mmol/L (ref 22–32)
Calcium: 9.4 mg/dL (ref 8.9–10.3)
Creatinine, Ser: 0.81 mg/dL (ref 0.61–1.24)
GFR calc Af Amer: >60 mL/min (ref >60)
GFR calc non Af Amer: >60 mL/min (ref >60)
GLUCOSE: 96 mg/dL (ref 65–99)
Potassium: 4.3 mmol/L (ref 3.5–5.1)
SODIUM: 137 mmol/L (ref 135–145)
Total Bilirubin: 0.4 mg/dL (ref 0.3–1.2)
Total Protein: 7.5 g/dL (ref 6.5–8.1)

## 2017-10-08 LAB — SURGICAL PCR SCREEN
MRSA, PCR: NEGATIVE
Staphylococcus aureus: NEGATIVE

## 2017-10-08 LAB — CBC
HCT: 39.4 % (ref 39.0–52.0)
Hemoglobin: 13 g/dL (ref 13.0–17.0)
MCH: 31.3 pg (ref 26.0–34.0)
MCHC: 33 g/dL (ref 30.0–36.0)
MCV: 94.9 fL (ref 78.0–100.0)
PLATELETS: 408 10*3/uL — AB (ref 150–400)
RBC: 4.15 MIL/uL — AB (ref 4.22–5.81)
RDW: 13.1 % (ref 11.5–15.5)
WBC: 9.9 10*3/uL (ref 4.0–10.5)

## 2017-10-08 SURGERY — AMPUTATION, ABOVE KNEE
Anesthesia: General | Site: Leg Upper | Laterality: Left

## 2017-10-08 MED ORDER — MUPIROCIN 2 % EX OINT
1.0000 "application " | TOPICAL_OINTMENT | Freq: Once | CUTANEOUS | Status: AC
  Administered 2017-10-08: 1 via TOPICAL
  Filled 2017-10-08: qty 22

## 2017-10-08 MED ORDER — HYDROMORPHONE HCL 2 MG/ML IJ SOLN
INTRAMUSCULAR | Status: AC
  Filled 2017-10-08: qty 1

## 2017-10-08 MED ORDER — PROPOFOL 10 MG/ML IV BOLUS
INTRAVENOUS | Status: AC
  Filled 2017-10-08: qty 20

## 2017-10-08 MED ORDER — LIDOCAINE 2% (20 MG/ML) 5 ML SYRINGE
INTRAMUSCULAR | Status: DC | PRN
  Administered 2017-10-08: 100 mg via INTRAVENOUS

## 2017-10-08 MED ORDER — ONDANSETRON HCL 4 MG/2ML IJ SOLN: 4.0000 mg | Freq: Four times a day (QID) | INTRAMUSCULAR | Status: DC | PRN

## 2017-10-08 MED ORDER — GUAIFENESIN-DM 100-10 MG/5ML PO SYRP: 15.0000 mL | ORAL_SOLUTION | ORAL | Status: DC | PRN

## 2017-10-08 MED ORDER — CEFAZOLIN SODIUM-DEXTROSE 2-4 GM/100ML-% IV SOLN
2.0000 g | Freq: Three times a day (TID) | INTRAVENOUS | Status: AC
  Administered 2017-10-08 – 2017-10-09 (×2): 2 g via INTRAVENOUS
  Filled 2017-10-08 (×2): qty 100

## 2017-10-08 MED ORDER — MIDAZOLAM HCL 2 MG/2ML IJ SOLN
INTRAMUSCULAR | Status: AC
  Filled 2017-10-08: qty 2

## 2017-10-08 MED ORDER — CEFAZOLIN SODIUM-DEXTROSE 2-4 GM/100ML-% IV SOLN
2.0000 g | INTRAVENOUS | Status: AC
  Administered 2017-10-08: 2 g via INTRAVENOUS
  Filled 2017-10-08: qty 100

## 2017-10-08 MED ORDER — MAGNESIUM SULFATE 2 GM/50ML IV SOLN: 2.0000 g | Freq: Every day | INTRAVENOUS | Status: DC | PRN

## 2017-10-08 MED ORDER — CHLORHEXIDINE GLUCONATE 4 % EX LIQD: 60.0000 mL | Freq: Once | CUTANEOUS | Status: DC

## 2017-10-08 MED ORDER — FENTANYL CITRATE (PF) 250 MCG/5ML IJ SOLN
INTRAMUSCULAR | Status: DC | PRN
  Administered 2017-10-08: 50 ug via INTRAVENOUS
  Administered 2017-10-08: 25 ug via INTRAVENOUS
  Administered 2017-10-08: 50 ug via INTRAVENOUS
  Administered 2017-10-08 (×3): 25 ug via INTRAVENOUS

## 2017-10-08 MED ORDER — PROPOFOL 10 MG/ML IV BOLUS
INTRAVENOUS | Status: DC | PRN
  Administered 2017-10-08: 100 mg via INTRAVENOUS

## 2017-10-08 MED ORDER — METOPROLOL TARTRATE 5 MG/5ML IV SOLN
2.0000 mg | INTRAVENOUS | Status: DC | PRN
  Administered 2017-10-08: 2 mg via INTRAVENOUS
  Filled 2017-10-08: qty 5

## 2017-10-08 MED ORDER — HYDRALAZINE HCL 20 MG/ML IJ SOLN: 5.0000 mg | INTRAMUSCULAR | Status: DC | PRN

## 2017-10-08 MED ORDER — 0.9 % SODIUM CHLORIDE (POUR BTL) OPTIME
TOPICAL | Status: DC | PRN
  Administered 2017-10-08: 1000 mL

## 2017-10-08 MED ORDER — MIDAZOLAM HCL 2 MG/2ML IJ SOLN
INTRAMUSCULAR | Status: DC | PRN
  Administered 2017-10-08: 1 mg via INTRAVENOUS

## 2017-10-08 MED ORDER — HYDROMORPHONE HCL 2 MG/ML IJ SOLN
0.5000 mg | Freq: Once | INTRAMUSCULAR | Status: AC
  Administered 2017-10-08: 0.5 mg via INTRAVENOUS

## 2017-10-08 MED ORDER — FLUCONAZOLE 150 MG PO TABS: 150.0000 mg | ORAL_TABLET | ORAL | Status: DC

## 2017-10-08 MED ORDER — ACETAMINOPHEN 10 MG/ML IV SOLN
INTRAVENOUS | Status: AC
  Filled 2017-10-08: qty 100

## 2017-10-08 MED ORDER — ATORVASTATIN CALCIUM 40 MG PO TABS
40.0000 mg | ORAL_TABLET | Freq: Every day | ORAL | Status: DC
  Administered 2017-10-09 – 2017-10-10 (×2): 40 mg via ORAL
  Filled 2017-10-08 (×3): qty 1

## 2017-10-08 MED ORDER — HYDROMORPHONE HCL 2 MG/ML IJ SOLN
0.2500 mg | INTRAMUSCULAR | Status: DC | PRN
  Administered 2017-10-08 (×4): 0.5 mg via INTRAVENOUS

## 2017-10-08 MED ORDER — SENNOSIDES-DOCUSATE SODIUM 8.6-50 MG PO TABS
2.0000 | ORAL_TABLET | Freq: Every day | ORAL | Status: DC
  Administered 2017-10-08 – 2017-10-09 (×2): 2 via ORAL
  Filled 2017-10-08 (×2): qty 2

## 2017-10-08 MED ORDER — PHENOL 1.4 % MT LIQD
1.0000 | OROMUCOSAL | Status: DC | PRN
Start: 2017-10-08 — End: 2017-10-11

## 2017-10-08 MED ORDER — POLYETHYLENE GLYCOL 3350 17 G PO PACK
17.0000 g | PACK | Freq: Every day | ORAL | Status: DC
  Administered 2017-10-09 – 2017-10-10 (×2): 17 g via ORAL
  Filled 2017-10-08 (×2): qty 1

## 2017-10-08 MED ORDER — MORPHINE SULFATE (PF) 2 MG/ML IV SOLN
2.0000 mg | INTRAVENOUS | Status: DC | PRN
  Administered 2017-10-09 – 2017-10-10 (×3): 2 mg via INTRAVENOUS
  Filled 2017-10-08 (×3): qty 1

## 2017-10-08 MED ORDER — SPIRONOLACTONE 25 MG PO TABS
25.0000 mg | ORAL_TABLET | Freq: Every day | ORAL | Status: DC
  Administered 2017-10-08 – 2017-10-10 (×3): 25 mg via ORAL
  Filled 2017-10-08 (×3): qty 1

## 2017-10-08 MED ORDER — LIDOCAINE 4 % EX PTCH: 1.0000 | MEDICATED_PATCH | Freq: Every day | CUTANEOUS | Status: DC

## 2017-10-08 MED ORDER — POTASSIUM CHLORIDE CRYS ER 20 MEQ PO TBCR: 20.0000 meq | EXTENDED_RELEASE_TABLET | Freq: Every day | ORAL | Status: DC | PRN

## 2017-10-08 MED ORDER — SODIUM CHLORIDE 0.9 % IV SOLN
INTRAVENOUS | Status: DC
  Administered 2017-10-08: 30 mL/h via INTRAVENOUS
  Administered 2017-10-09: 19:00:00 via INTRAVENOUS

## 2017-10-08 MED ORDER — AMLODIPINE BESYLATE 10 MG PO TABS
10.0000 mg | ORAL_TABLET | Freq: Every day | ORAL | Status: DC
  Administered 2017-10-08 – 2017-10-10 (×3): 10 mg via ORAL
  Filled 2017-10-08 (×3): qty 1

## 2017-10-08 MED ORDER — FENTANYL CITRATE (PF) 250 MCG/5ML IJ SOLN
INTRAMUSCULAR | Status: AC
  Filled 2017-10-08: qty 5

## 2017-10-08 MED ORDER — VANCOMYCIN HCL IN DEXTROSE 1-5 GM/200ML-% IV SOLN
1000.0000 mg | INTRAVENOUS | Status: AC
  Administered 2017-10-08: 1000 mg via INTRAVENOUS
  Filled 2017-10-08: qty 200

## 2017-10-08 MED ORDER — PHENYLEPHRINE HCL 10 MG/ML IJ SOLN
INTRAMUSCULAR | Status: DC | PRN
  Administered 2017-10-08: 120 ug via INTRAVENOUS

## 2017-10-08 MED ORDER — DOCUSATE SODIUM 100 MG PO CAPS: 100.0000 mg | ORAL_CAPSULE | Freq: Every day | ORAL | Status: DC

## 2017-10-08 MED ORDER — CLOPIDOGREL BISULFATE 75 MG PO TABS
75.0000 mg | ORAL_TABLET | Freq: Every day | ORAL | Status: DC
  Administered 2017-10-09 – 2017-10-10 (×2): 75 mg via ORAL
  Filled 2017-10-08 (×2): qty 1

## 2017-10-08 MED ORDER — OXYCODONE-ACETAMINOPHEN 5-325 MG PO TABS
1.0000 | ORAL_TABLET | Freq: Four times a day (QID) | ORAL | Status: DC | PRN
  Administered 2017-10-08 – 2017-10-09 (×5): 1 via ORAL
  Filled 2017-10-08 (×6): qty 1

## 2017-10-08 MED ORDER — ONDANSETRON HCL 4 MG/2ML IJ SOLN
INTRAMUSCULAR | Status: DC | PRN
  Administered 2017-10-08: 4 mg via INTRAVENOUS

## 2017-10-08 MED ORDER — ACETAMINOPHEN 325 MG PO TABS: 325.0000 mg | ORAL_TABLET | ORAL | Status: DC | PRN

## 2017-10-08 MED ORDER — ACETAMINOPHEN 10 MG/ML IV SOLN
INTRAVENOUS | Status: DC | PRN
  Administered 2017-10-08: 1000 mg via INTRAVENOUS

## 2017-10-08 MED ORDER — SODIUM CHLORIDE 0.9 % IV SOLN: INTRAVENOUS | Status: DC

## 2017-10-08 MED ORDER — PROMETHAZINE HCL 25 MG/ML IJ SOLN: 6.2500 mg | INTRAMUSCULAR | Status: DC | PRN

## 2017-10-08 MED ORDER — ACETAMINOPHEN 325 MG RE SUPP
325.0000 mg | RECTAL | Status: DC | PRN
Start: 2017-10-08 — End: 2017-10-11

## 2017-10-08 MED ORDER — ALUM & MAG HYDROXIDE-SIMETH 200-200-20 MG/5ML PO SUSP: 15.0000 mL | ORAL | Status: DC | PRN

## 2017-10-08 MED ORDER — VITAMIN D (ERGOCALCIFEROL) 1.25 MG (50000 UNIT) PO CAPS: 50000.0000 [IU] | ORAL_CAPSULE | ORAL | Status: DC

## 2017-10-08 MED ORDER — LABETALOL HCL 5 MG/ML IV SOLN: 10.0000 mg | INTRAVENOUS | Status: DC | PRN

## 2017-10-08 MED ORDER — LACTATED RINGERS IV SOLN
INTRAVENOUS | Status: DC
  Administered 2017-10-08 (×2): via INTRAVENOUS

## 2017-10-08 MED ORDER — PANTOPRAZOLE SODIUM 20 MG PO TBEC: 20.0000 mg | DELAYED_RELEASE_TABLET | Freq: Every day | ORAL | Status: DC | PRN

## 2017-10-08 MED ORDER — DOCUSATE SODIUM 100 MG PO CAPS
100.0000 mg | ORAL_CAPSULE | Freq: Every day | ORAL | Status: DC
  Administered 2017-10-09 – 2017-10-10 (×2): 100 mg via ORAL
  Filled 2017-10-08 (×2): qty 1

## 2017-10-08 MED ORDER — METOPROLOL TARTRATE 25 MG PO TABS
25.0000 mg | ORAL_TABLET | Freq: Two times a day (BID) | ORAL | Status: DC
  Administered 2017-10-08 – 2017-10-10 (×4): 25 mg via ORAL
  Filled 2017-10-08 (×4): qty 1

## 2017-10-08 MED ORDER — ENOXAPARIN SODIUM 30 MG/0.3ML ~~LOC~~ SOLN
30.0000 mg | SUBCUTANEOUS | Status: DC
  Administered 2017-10-09 – 2017-10-10 (×2): 30 mg via SUBCUTANEOUS
  Filled 2017-10-08 (×2): qty 0.3

## 2017-10-08 SURGICAL SUPPLY — 52 items
BANDAGE ACE 4X5 VEL STRL LF (GAUZE/BANDAGES/DRESSINGS) ×2 IMPLANT
BANDAGE ACE 6X5 VEL STRL LF (GAUZE/BANDAGES/DRESSINGS) ×2 IMPLANT
BANDAGE ELASTIC 6 VELCRO ST LF (GAUZE/BANDAGES/DRESSINGS) ×2 IMPLANT
BLADE SAW GIGLI 510 (BLADE) ×2 IMPLANT
BLADE SAW RECIP 87.9 MT (BLADE) ×2 IMPLANT
BNDG COHESIVE 6X5 TAN STRL LF (GAUZE/BANDAGES/DRESSINGS) ×2 IMPLANT
BNDG GAUZE ELAST 4 BULKY (GAUZE/BANDAGES/DRESSINGS) ×2 IMPLANT
CANISTER SUCT 3000ML PPV (MISCELLANEOUS) ×2 IMPLANT
CLIP VESOCCLUDE MED 6/CT (CLIP) ×2 IMPLANT
COVER SURGICAL LIGHT HANDLE (MISCELLANEOUS) ×2 IMPLANT
DRAIN CHANNEL 19F RND (DRAIN) IMPLANT
DRAPE HALF SHEET 40X57 (DRAPES) ×2 IMPLANT
DRAPE ORTHO SPLIT 77X108 STRL (DRAPES) ×2
DRAPE SURG ORHT 6 SPLT 77X108 (DRAPES) ×2 IMPLANT
DRSG ADAPTIC 3X8 NADH LF (GAUZE/BANDAGES/DRESSINGS) ×2 IMPLANT
DRSG EMULSION OIL 3X3 NADH (GAUZE/BANDAGES/DRESSINGS) ×2 IMPLANT
ELECT CAUTERY BLADE 6.4 (BLADE) ×2 IMPLANT
ELECT REM PT RETURN 9FT ADLT (ELECTROSURGICAL) ×2
ELECTRODE REM PT RTRN 9FT ADLT (ELECTROSURGICAL) ×1 IMPLANT
EVACUATOR SILICONE 100CC (DRAIN) IMPLANT
GAUZE SPONGE 4X4 12PLY STRL (GAUZE/BANDAGES/DRESSINGS) ×2 IMPLANT
GAUZE SPONGE 4X4 12PLY STRL LF (GAUZE/BANDAGES/DRESSINGS) ×2 IMPLANT
GLOVE BIO SURGEON STRL SZ 6.5 (GLOVE) ×8 IMPLANT
GLOVE BIO SURGEON STRL SZ7.5 (GLOVE) ×2 IMPLANT
GLOVE BIOGEL PI IND STRL 6.5 (GLOVE) ×2 IMPLANT
GLOVE BIOGEL PI IND STRL 7.5 (GLOVE) ×3 IMPLANT
GLOVE BIOGEL PI INDICATOR 6.5 (GLOVE) ×2
GLOVE BIOGEL PI INDICATOR 7.5 (GLOVE) ×3
GLOVE SKINSENSE NS SZ7.0 (GLOVE) ×1
GLOVE SKINSENSE STRL SZ7.0 (GLOVE) ×1 IMPLANT
GLOVE SURG SS PI 7.0 STRL IVOR (GLOVE) ×4 IMPLANT
GOWN STRL REUS W/ TWL LRG LVL3 (GOWN DISPOSABLE) ×3 IMPLANT
GOWN STRL REUS W/ TWL XL LVL3 (GOWN DISPOSABLE) ×1 IMPLANT
GOWN STRL REUS W/TWL LRG LVL3 (GOWN DISPOSABLE) ×3
GOWN STRL REUS W/TWL XL LVL3 (GOWN DISPOSABLE) ×1
KIT BASIN OR (CUSTOM PROCEDURE TRAY) ×2 IMPLANT
KIT TURNOVER KIT B (KITS) ×2 IMPLANT
NS IRRIG 1000ML POUR BTL (IV SOLUTION) ×2 IMPLANT
PACK GENERAL/GYN (CUSTOM PROCEDURE TRAY) ×2 IMPLANT
PAD ARMBOARD 7.5X6 YLW CONV (MISCELLANEOUS) ×4 IMPLANT
STAPLER VISISTAT 35W (STAPLE) ×2 IMPLANT
STOCKINETTE IMPERVIOUS LG (DRAPES) IMPLANT
SUT ETHILON 3 0 PS 1 (SUTURE) IMPLANT
SUT SILK 0 TIES 10X30 (SUTURE) ×4 IMPLANT
SUT SILK 2 0 (SUTURE) ×1
SUT SILK 2-0 18XBRD TIE 12 (SUTURE) ×1 IMPLANT
SUT SILK 3 0 (SUTURE)
SUT SILK 3-0 18XBRD TIE 12 (SUTURE) IMPLANT
SUT VIC AB 2-0 CT1 18 (SUTURE) ×2 IMPLANT
TOWEL GREEN STERILE (TOWEL DISPOSABLE) ×4 IMPLANT
UNDERPAD 30X30 (UNDERPADS AND DIAPERS) ×2 IMPLANT
WATER STERILE IRR 1000ML POUR (IV SOLUTION) ×2 IMPLANT

## 2017-10-08 NOTE — Progress Notes (Addendum)
Telephone consent obtained from pt brother Dorene SorrowJerry. Consent on chart  Unable to obtain pt weight. Bilat amp

## 2017-10-08 NOTE — Op Note (Signed)
    Patient name: Stephen ApleyJerome D Bowen MRN: [id number] DOB: [birth year] Sex: male  10/08/2017 Pre-operative Diagnosis: Left above-knee amputation with bone exposure Post-operative diagnosis:  Same Surgeon:  Stephen JunesBrandon C. Randie Heinzain, MD Assistant: Doreatha MassedSamantha Rhyne PA Procedure Performed: Revision left above-knee amputation  Indications: 62 year old male who is undergone a left above-the-knee amputation that was complicated by wound infection requiring healing by secondary intention with help of wound VAC.  He now has bone exposure that is dry and not infected and is indicated for revision of this amputation site.  Findings: There was no gross infection.  There is an occluded PTFE graft that was transected.  I completion all tissue was healthy and the flaps approximated easily.   Procedure:  The patient was identified in the holding area and taken to the operating room where he was placed supine on the operative table and general anesthesia was induced.  He was sterilely prepped and draped in the left leg in the usual fashion given antibiotics and timeout called.  We began with a new fishmouth incision approximately 10 cm above the existing above-knee amputation site.  We dissected with cautery down to the level of the bone and transected this with oscillating saw.  This the bone was initially slightly too long and so we then used a Gigli saw to take another 2 inches back.  There was a PTFE graft in the wound and this was pulled on tension ligated and divided.  Hemostasis was obtained and the wound was irrigated.  Flaps were then approximated with 2-0 Vicryl suture.  He was then allowed away from anesthesia having tolerated procedure without immediate comp occasion.  All counts were correct at completion.  Next  EBL 50 cc.   Claudie Rathbone C. Randie Heinzain, MD [facility] [place] Office: [phone] Pager: [phone]

## 2017-10-08 NOTE — Evaluation (Signed)
Physical Therapy Evaluation Patient Details Name: Stephen ApleyJerome D Dibble MRN: [id number] DOB: Sep 01, 1955 Today's Date: 10/08/2017   History of Present Illness  62 year old male previous bilateral above-knee amputations now presents with what appears to be exposed bone through his left amputation site.  This had healed by secondary intention with palpable wound VAC, presented to Vascular specialist with exposed dry bone. S/p L AKA revision 10/08/17. PMH includes, CAD, MI, PAD, and HTN.   Clinical Impression  Pt very poor historian. Per notes, pt is resident of SNF. Pt reports he was able to transfer to w/c with assistance. Pt attempted to come to sitting EoB however was limited by increase in pain and HR. It is possible that pt is at his baseline level of function. PT recommending discharge back to his SNF. PT will attempt working on transfers during next session if pt is less painful.     Follow Up Recommendations SNF    Equipment Recommendations  None recommended by PT    Recommendations for Other Services       Precautions / Restrictions Precautions Precautions: Fall Restrictions Weight Bearing Restrictions: Yes LLE Weight Bearing: Non weight bearing      Mobility  Bed Mobility Overal bed mobility: Needs Assistance Bed Mobility: Rolling;Supine to Sit Rolling: Min guard   Supine to sit: Max assist;HOB elevated     General bed mobility comments: attempted to sit EoB, unable to bring trunk fully to upright due to increased L LE pain, request to lay back down   Transfers                 General transfer comment: not attempted            Pertinent Vitals/Pain Pain Assessment: Faces Faces Pain Scale: Hurts whole lot Pain Location: L residual limb Pain Descriptors / Indicators: Pressure Pain Intervention(s): Limited activity within patient's tolerance;Monitored during session;Patient requesting pain meds-RN notified    Home Living Family/patient expects to be  discharged to:: Skilled nursing facility                      Prior Function Level of Independence: Needs assistance   Gait / Transfers Assistance Needed: [facility]  ADL's / Homemaking Assistance Needed: requires assist for bathing, grooming, dressing           Extremity/Trunk Assessment   Upper Extremity Assessment Upper Extremity Assessment: Defer to OT evaluation    Lower Extremity Assessment Lower Extremity Assessment: RLE deficits/detail;LLE deficits/detail RLE Deficits / Details: R AKA, hip ROM WFL, strength grossly 4/5 LLE Deficits / Details: L AKA revision, able to flex/extend with pain, unable to AB/AD pain too great       Communication   Communication: No difficulties  Cognition Arousal/Alertness: Awake/alert Behavior During Therapy: Flat affect Overall Cognitive Status: History of cognitive impairments - at baseline                                 General Comments: not oriented to time or situation, knows he is in hospital not sure why      General Comments General comments (skin integrity, edema, etc.): resting HR 120 bpm, with attempt to sit HR rose to 150 bpm        Assessment/Plan    PT Assessment Patient needs continued PT services  PT Problem List Decreased strength;Decreased activity tolerance;Decreased mobility;Decreased cognition;Decreased knowledge of use of DME;Pain;Cardiopulmonary status limiting  activity       PT Treatment Interventions DME instruction;Functional mobility training;Therapeutic activities;Therapeutic exercise;Cognitive remediation;Patient/family education    PT Goals (Current goals can be found in the Care Plan section)  Acute Rehab PT Goals Patient Stated Goal: none stated PT Goal Formulation: Patient unable to participate in goal setting Time For Goal Achievement: 10/15/17 Potential to Achieve Goals: Poor    Frequency Min 2X/week    AM-PAC PT "6 Clicks" Daily Activity  Outcome  Measure Difficulty turning over in bed (including adjusting bedclothes, sheets and blankets)?: Unable Difficulty moving from lying on back to sitting on the side of the bed? : Unable Difficulty sitting down on and standing up from a chair with arms (e.g., wheelchair, bedside commode, etc,.)?: Unable Help needed moving to and from a bed to chair (including a wheelchair)?: Total Help needed walking in hospital room?: Total Help needed climbing 3-5 steps with a railing? : Total 6 Click Score: 6    End of Session   Activity Tolerance: Patient limited by pain Patient left: in bed;with call bell/phone within reach;with bed alarm set Nurse Communication: Mobility status;Other (comment)(state of confusion) PT Visit Diagnosis: Pain;Other abnormalities of gait and mobility (R26.89);Muscle weakness (generalized) (M62.81) Pain - Right/Left: Left Pain - part of body: Leg    Time: 9604-5409 PT Time Calculation (min) (ACUTE ONLY): 24 min   Charges:   PT Evaluation $PT Eval Moderate Complexity: 1 Mod PT Treatments $Therapeutic Activity: 8-22 mins   PT G Codes:        Bhavin Monjaraz B. Beverely Risen PT, DPT Acute Rehabilitation  [phone] Pager [phone]    Elon Alas Fleet 10/08/2017, 4:50 PM

## 2017-10-08 NOTE — H&P (Signed)
   History and Physical Update  The patient was interviewed and re-examined.  The patient's previous History and Physical has been reviewed and is unchanged from office visit on 3.22. Plan for revision of left aka. Brother has been contacted to give consent.   Brandon C. Randie Heinzain, MD [facility] [place] Office: [phone] Pager: [phone]   10/08/2017, 6:55 AM

## 2017-10-08 NOTE — Telephone Encounter (Signed)
-----   Message from Sharee PimpleMarilyn K McChesney, RN sent at 10/08/2017 10:31 AM EDT ----- Regarding: 4 weeks for staples, had revision of AKA   ----- Message ----- From: Dara Lordshyne, Samantha J, PA-C Sent: 10/08/2017   9:57 AM To: Vvs Charge Pool  S/p left AKA revision 10/08/17.  F/u with Dr. Randie Heinzain in 4 weeks.  Thanks

## 2017-10-08 NOTE — Anesthesia Postprocedure Evaluation (Signed)
Anesthesia Post Note  Patient: Stephen Bowen  Procedure(s) Performed: LEFT ABOVE KNEE AMPUTATION REVISION (Left Leg Upper)     Patient location during evaluation: PACU Anesthesia Type: General Level of consciousness: sedated Pain management: pain level controlled Vital Signs Assessment: post-procedure vital signs reviewed and stable Respiratory status: spontaneous breathing and respiratory function stable Cardiovascular status: stable Postop Assessment: no apparent nausea or vomiting Anesthetic complications: no    Last Vitals:  Vitals:   10/08/17 1115 10/08/17 1116  BP: 120/79   Pulse: 81 79  Resp: (!) 9 11  Temp:    SpO2: 99% 99%    Last Pain:  Vitals:   10/08/17 1120  TempSrc:   PainSc: 9                  Joleene Burnham DANIEL

## 2017-10-08 NOTE — Progress Notes (Signed)
Attempted to reach pt brother message left for him to call us back. [facility] called and med rec updated

## 2017-10-08 NOTE — Progress Notes (Signed)
Pt received from PACU. L AKA revision site with clean, dry ACE. Femoral pulse intact. Pt is alert to self and that he is in the hospital - not alert to time, thinks it is August. Dinner order placed. VSS. Telemetry applied, CCMD notified.   Leonidas Rombergaitlin S Bumbledare, RN

## 2017-10-08 NOTE — Anesthesia Procedure Notes (Signed)
Procedure Name: LMA Insertion Date/Time: 10/08/2017 8:56 AM Performed by: Dairl PonderJiang, Nimrod Wendt, CRNA Pre-anesthesia Checklist: Patient identified, Emergency Drugs available, Suction available, Patient being monitored and Timeout performed Patient Re-evaluated:Patient Re-evaluated prior to induction Oxygen Delivery Method: Circle system utilized Preoxygenation: Pre-oxygenation with 100% oxygen Induction Type: IV induction LMA Size: 4.0 Number of attempts: 1 Placement Confirmation: positive ETCO2 and breath sounds checked- equal and bilateral Tube secured with: Tape Dental Injury: Teeth and Oropharynx as per pre-operative assessment

## 2017-10-08 NOTE — Transfer of Care (Signed)
Immediate Anesthesia Transfer of Care Note  Patient: Stephen ApleyJerome D Lueras  Procedure(s) Performed: LEFT ABOVE KNEE AMPUTATION REVISION (Left Leg Upper)  Patient Location: PACU  Anesthesia Type:General  Level of Consciousness: awake  Airway & Oxygen Therapy: Patient Spontanous Breathing and Patient connected to nasal cannula oxygen  Post-op Assessment: Report given to RN and Post -op Vital signs reviewed and stable  Post vital signs: Reviewed and stable  Last Vitals:  Vitals Value Taken Time  BP 118/80 10/08/2017 10:00 AM  Temp    Pulse 64 10/08/2017 10:00 AM  Resp 10 10/08/2017 10:00 AM  SpO2 100 % 10/08/2017 10:00 AM  Vitals shown include unvalidated device data.  Last Pain:  Vitals:   10/08/17 0644  TempSrc: Oral  PainSc:       Patients Stated Pain Goal: (unable to answer ) (10/08/17 0631)  Complications: No apparent anesthesia complications

## 2017-10-08 NOTE — Telephone Encounter (Signed)
Sched appt 11/08/17 at 12:45. [facility] and [facility]'s brother to inform them of appt.

## 2017-10-09 ENCOUNTER — Encounter (HOSPITAL_COMMUNITY): Payer: Self-pay | Admitting: Vascular Surgery

## 2017-10-09 LAB — CBC
HEMATOCRIT: 36.9 % — AB (ref 39.0–52.0)
HEMOGLOBIN: 11.9 g/dL — AB (ref 13.0–17.0)
MCH: 30.5 pg (ref 26.0–34.0)
MCHC: 32.2 g/dL (ref 30.0–36.0)
MCV: 94.6 fL (ref 78.0–100.0)
Platelets: 400 10*3/uL (ref 150–400)
RBC: 3.9 MIL/uL — ABNORMAL LOW (ref 4.22–5.81)
RDW: 13.5 % (ref 11.5–15.5)
WBC: 9.5 10*3/uL (ref 4.0–10.5)

## 2017-10-09 LAB — BASIC METABOLIC PANEL
ANION GAP: 12 (ref 5–15)
BUN: 8 mg/dL (ref 6–20)
CHLORIDE: 99 mmol/L — AB (ref 101–111)
CO2: 25 mmol/L (ref 22–32)
Calcium: 9.4 mg/dL (ref 8.9–10.3)
Creatinine, Ser: 0.8 mg/dL (ref 0.61–1.24)
GFR calc Af Amer: >60 mL/min (ref >60)
GLUCOSE: 108 mg/dL — AB (ref 65–99)
Potassium: 4.1 mmol/L (ref 3.5–5.1)
Sodium: 136 mmol/L (ref 135–145)

## 2017-10-09 MED ORDER — LIDOCAINE 5 % EX PTCH: 1.0000 | MEDICATED_PATCH | CUTANEOUS | Status: DC

## 2017-10-09 NOTE — Progress Notes (Signed)
Patient [name] 130's Lopressor 2 mg I.V. Given.

## 2017-10-09 NOTE — Progress Notes (Signed)
H.R. back down S.T. 102.

## 2017-10-09 NOTE — Care Management Note (Signed)
Case Management Note Donn PieriniKristi Ladarious Kresse RN, BSN Unit 4E-Case Manager [phone]  Patient Details  Name: Stephen ApleyJerome D Lookingbill MRN: [id number] Date of Birth: 09/04/1955  Subjective/Objective:  Pt admitted s/p revision of left AKA                 Action/Plan: P[facility] (was notified by Elmarie Shileyiffany with [facility] of pre[facility] referral [facility])- transition plan is to return to SNF - CSW to follow for transition needs to [facility] once medically stable.   Expected Discharge Date:                  Expected Discharge Plan:     In-House Referral:  Clinical Social Work  Discharge planning Services  CM Consult  Post Acute Care Choice:  NA Choice offered to:  NA  DME Arranged:    DME Agency:     HH Arranged:    HH Agency:     Status of Service:  Completed, signed off  If discussed at [employer] Length of Stay Meetings, dates discussed:    Discharge Disposition: skilled facility   Additional Comments:  Darrold SpanWebster, Felipa Laroche Hall, RN 10/09/2017, 9:53 AM

## 2017-10-09 NOTE — Progress Notes (Addendum)
  Progress Note    10/09/2017 9:10 AM 1 Day Post-Op  Subjective:  Answers yes when asked if he is having pain.  Tim 99.3 now afebrile  Vitals:   10/09/17 0424 10/09/17 0739  BP: 117/88 135/89  Pulse: 100 92  Resp: 20 20  Temp: 99.3 F (37.4 C) 98.3 F (36.8 C)  SpO2: 97% 96%    Physical Exam: Incisions:  Bandage is clean and dry without drainage   CBC    Component Value Date/Time   WBC 9.5 10/09/2017 0233   RBC 3.90 (L) 10/09/2017 0233   HGB 11.9 (L) 10/09/2017 0233   HCT 36.9 (L) 10/09/2017 0233   PLT 400 10/09/2017 0233   MCV 94.6 10/09/2017 0233   MCH 30.5 10/09/2017 0233   MCHC 32.2 10/09/2017 0233   RDW 13.5 10/09/2017 0233   LYMPHSABS 1.8 08/01/2016 0213   MONOABS 0.5 08/01/2016 0213   EOSABS 0.2 08/01/2016 0213   BASOSABS 0.0 08/01/2016 0213    BMET    Component Value Date/Time   NA 136 10/09/2017 0233   K 4.1 10/09/2017 0233   CL 99 (L) 10/09/2017 0233   CO2 25 10/09/2017 0233   GLUCOSE 108 (H) 10/09/2017 0233   BUN 8 10/09/2017 0233   CREATININE 0.80 10/09/2017 0233   CREATININE 0.92 11/13/2013 1031   CALCIUM 9.4 10/09/2017 0233   GFRNONAA >60 10/09/2017 0233   GFRNONAA >89 11/13/2013 1031   GFRAA >60 10/09/2017 0233   GFRAA >89 11/13/2013 1031    INR    Component Value Date/Time   INR 1.00 09/03/2016 1621     Intake/Output Summary (Last 24 hours) at 10/09/2017 0910 Last data filed at 10/09/2017 0555 Gross per 24 hour  Intake 1530 ml  Output 1550 ml  Net -20 ml     Assessment/Plan:  62 y.o. male is s/p revision of left above knee amputation  1 Day Post-Op  -pt's bandage is clean and dry; will change dressing tomorrow and plan dc back to SNF tomorrow -RN will give Morphine 1mg  this am   Doreatha MassedSamantha Rhyne, PA-C [facility] [phone] 10/09/2017 9:10 AM    I have independently interviewed and examined patient and agree with PA assessment and plan above.   Zeev Deakins C. Randie Heinzain, MD [facility] [place] Office: [phone] Pager: [phone]

## 2017-10-09 NOTE — Evaluation (Addendum)
Occupational Therapy Evaluation Patient Details Name: Stephen ApleyJerome D Bowen MRN: [id number] DOB: October 02, 1955 Today's Date: 10/09/2017    History of Present Illness 62 year old male previous bilateral above-knee amputations now presents with what appears to be exposed bone through his left amputation site.  This had healed by secondary intention with palpable wound VAC, presented to Vascular specialist with exposed dry bone. S/p L AKA revision 10/08/17. PMH includes, CAD, MI, PAD, and HTN.    Clinical Impression   This 62 y/o male presents with the above. PLOF obtained through chart review as pt poor historian. Pt was previously residing in SNF setting, receiving assist for transfer to w/c and assist for ADL completion. Pt currently requiring min-modA for bed mobility; attempted A/P transfer to w/c this session however due to increased pain/fatigue pt initiating return to supine prior to transfer completion. Pt currently requires MinA for UB ADLs, Max-total assist for LB ADLs; pt demonstrating decreased activity tolerance, pain, and cognitive impairments, requiring multimodal cues, increased time and repetition to follow one step commands. Pt will benefit from continued acute OT services and recommend SNF level services after discharge to maximize his safety and independence with ADLs and mobility.     Follow Up Recommendations  SNF;Supervision/Assistance - 24 hour    Equipment Recommendations  Other (comment)(TBD in next venue )           Precautions / Restrictions Precautions Precautions: Fall Restrictions Weight Bearing Restrictions: Yes LLE Weight Bearing: Non weight bearing      Mobility Bed Mobility Overal bed mobility: Needs Assistance Bed Mobility: Supine to Sit;Sit to Supine Rolling: Min assist   Supine to sit: Mod assist     General bed mobility comments: pt completing long sitting in bed with MinA to bring trunk upright into sitting; modA for trunk and hip positioning when  returning to supine; verbal cues for technique using UEs to assist with tranfsers  Transfers Overall transfer level: Needs assistance   Transfers: Anterior-Posterior Transfer       Anterior-Posterior transfers: Mod assist   General transfer comment: pt initiating pivoting body in preparation for A/P transfer to w/c, demonstrates good use of UEs to assist; prior to transtition to chair pt experiencing increased pain/fatigue and initiating return to supine; not able to redirect due to cognitive deficits    Balance Overall balance assessment: Needs assistance Sitting-balance support: Bilateral upper extremity supported;Single extremity supported Sitting balance-Leahy Scale: Fair Sitting balance - Comments: able to maintain long sitting in bed with minA-minguard                                    ADL either performed or assessed with clinical judgement   ADL Overall ADL's : Needs assistance/impaired Eating/Feeding: Set up;Sitting   Grooming: Wash/dry face;Oral care;Set up;Minimal assistance;Sitting;Min guard Grooming Details (indicate cue type and reason): MinSteadying assist while long sitting in bed; progressed to MinGuard for static sitting balance Upper Body Bathing: Minimal assistance;Sitting   Lower Body Bathing: Moderate assistance;Bed level;Sitting/lateral leans   Upper Body Dressing : Minimal assistance;Sitting   Lower Body Dressing: Maximal assistance;Bed level;Sitting/lateral leans       Toileting- Clothing Manipulation and Hygiene: Total assistance;Bed level         General ADL Comments: pt requires encouragement and increased time to participate in session due to pain/fatigue; increased assist for ADL completion partly due to cognitive impairments; pt completing long sitting and sitting up in bed approx  8 min, initially with MinA, progressed to minguard; attempted A/P transfer to w/c, pt initiating turning body to begin transfer but with onset of  increased pain/fatigue during attempt and initiating return to supine; educated on importance of continued mobility/OOB and pt agreeable to OOB to chair later today or tomorrow                         Pertinent Vitals/Pain Pain Assessment: Faces Faces Pain Scale: Hurts even more Pain Location: L residual limb Pain Descriptors / Indicators: Pressure;Grimacing Pain Intervention(s): Monitored during session;Repositioned;Premedicated before session          Extremity/Trunk Assessment Upper Extremity Assessment Upper Extremity Assessment: Overall WFL for tasks assessed   Lower Extremity Assessment Lower Extremity Assessment: Defer to PT evaluation       Communication Communication Communication: No difficulties   Cognition Arousal/Alertness: Awake/alert Behavior During Therapy: Flat affect Overall Cognitive Status: History of cognitive impairments - at baseline                                 General Comments: pt with notable confusion during session, fluctuating answers regarding PLOF, follows 1 step commands intermittently with repetition and increased time/multimodal cues    General Comments  highest HR 120 bpm during session                Home Living Family/patient expects to be discharged to:: Skilled nursing facility                                        Prior Functioning/Environment Level of Independence: Needs assistance  Gait / Transfers Assistance Needed: assist for transfer to [facility] ADL's / Homemaking Assistance Needed: requires assist for bathing, grooming, dressing   Comments: information taken from chart review as pt not able to provide PLOF         OT Problem List: Decreased strength;Impaired balance (sitting and/or standing);Decreased cognition;Pain;Decreased range of motion;Decreased activity tolerance;Decreased knowledge of use of DME or AE      OT Treatment/Interventions: Self-care/ADL training;DME and/or AE  instruction;Therapeutic activities;Balance training;Therapeutic exercise;Energy conservation;Patient/family education    OT Goals(Current goals can be found in the care plan section) Acute Rehab OT Goals Patient Stated Goal: none stated OT Goal Formulation: Patient unable to participate in goal setting Time For Goal Achievement: 10/23/17 Potential to Achieve Goals: Fair  OT Frequency: Min 2X/week                             AM-PAC PT "6 Clicks" Daily Activity     Outcome Measure Help from another person eating meals?: A Little Help from another person taking care of personal grooming?: A Little Help from another person toileting, which includes using toliet, bedpan, or urinal?: Total Help from another person bathing (including washing, rinsing, drying)?: A Lot Help from another person to put on and taking off regular upper body clothing?: A Lot Help from another person to put on and taking off regular lower body clothing?: Total 6 Click Score: 12   End of Session Nurse Communication: Mobility status  Activity Tolerance: Patient tolerated treatment well;Patient limited by pain;Patient limited by fatigue Patient left: in bed;with call bell/phone within reach  OT Visit Diagnosis: Muscle weakness (generalized) (M62.81);Other abnormalities of gait and  mobility (R26.89);Pain Pain - Right/Left: Left Pain - part of body: Leg                Time: 1100-1136 OT Time Calculation (min): 36 min Charges:  OT General Charges $OT Visit: 1 Visit OT Evaluation $OT Eval Moderate Complexity: 1 Mod OT Treatments $Self Care/Home Management : 8-22 mins G-Codes:     Marcy Siren, OT Pager [phone] 10/09/2017   Orlando Penner 10/09/2017, 2:17 PM

## 2017-10-09 NOTE — Clinical Social Work Note (Signed)
Clinical Social Work Assessment  Patient Details  Name: Stephen Bowen MRN: [id number] Date of Birth: [birth year]  Date of referral:  10/09/17               Reason for consult:  Facility Placement                Permission sought to share information with:  Family Supports Permission granted to share information::  Yes, Verbal Permission Granted  Name::     Stephen Bowen  Agency::  [facility]  Relationship::  brother  Contact Information:  [phone]  Housing/Transportation Living arrangements for the past 2 months:  [place] of Information:  Facility Patient Interpreter Needed:  None Criminal Activity/Legal Involvement Pertinent to Current Situation/Hospitalization:  No - Comment as needed Significant Relationships:  Siblings, [employer] Lives with:  Facility Resident Do you feel safe going back to the place where you live?  Yes Need for family participation in patient care:  Yes (Comment)  Care giving concerns:  No family at bedside. CSW spoke contacted patients brother Stephen Bowen and left a voicemail for him to contact CSW back. Patient is from [facility] and is there long term term Bowen.  Social Worker assessment / plan:  CSW met patient at bedside to introduce her role in his care. Patient is cognitive x2-3 and very pleasant. Patient does not remember the name of the facility he came from. CSW has had patient in the past and is aware of his diagnoses. Patients friend came into the room and patient and friend were joking around for a couple of minutes before friend left. CSW spoke to [facility] admission coordinator [place] and she stated they will be ale to take patient back once medically cleared by medical team.   Employment status:  Disabled (Comment on whether or not currently receiving Disability) Insurance information:  Medicaid [place] PT Recommendations:  Not assessed at this time Information / Referral to community  resources:  [place]  Patient/Family's Response to care:  CSW awaiting call back from patients brother to make sure family has no questions in regards to patients care and discharge  Patient/Family's Understanding of and Emotional Response to Diagnosis, Current Treatment, and Prognosis: Patient very pleasant and is aware of the surgery he received. Patient does have hard time remembering or knowing where he is at times  Emotional Assessment Appearance:  Appears stated age Attitude/Demeanor/Rapport:  Engaged Affect (typically observed):  Accepting Orientation:  Oriented to Self, Oriented to Situation Alcohol / Substance use:  Not Applicable Psych involvement (Current and /or in the community):  No (Comment)  Discharge Needs  Concerns to be addressed:  No discharge needs identified Readmission within the last 30 days:  No Current discharge risk:  None Barriers to Discharge:  No Barriers Identified   Wende Neighbors, LCSW 10/09/2017, 10:57 AM

## 2017-10-10 LAB — CBC
HCT: 33.1 % — ABNORMAL LOW (ref 39.0–52.0)
Hemoglobin: 10.7 g/dL — ABNORMAL LOW (ref 13.0–17.0)
MCH: 30.6 pg (ref 26.0–34.0)
MCHC: 32.3 g/dL (ref 30.0–36.0)
MCV: 94.6 fL (ref 78.0–100.0)
Platelets: 375 10*3/uL (ref 150–400)
RBC: 3.5 MIL/uL — ABNORMAL LOW (ref 4.22–5.81)
RDW: 13.6 % (ref 11.5–15.5)
WBC: 8.4 10*3/uL (ref 4.0–10.5)

## 2017-10-10 LAB — BASIC METABOLIC PANEL
ANION GAP: 8 (ref 5–15)
BUN: 8 mg/dL (ref 6–20)
CALCIUM: 8.7 mg/dL — AB (ref 8.9–10.3)
CO2: 26 mmol/L (ref 22–32)
CREATININE: 0.75 mg/dL (ref 0.61–1.24)
Chloride: 103 mmol/L (ref 101–111)
GFR calc Af Amer: >60 mL/min (ref >60)
GLUCOSE: 116 mg/dL — AB (ref 65–99)
Potassium: 3.9 mmol/L (ref 3.5–5.1)
Sodium: 137 mmol/L (ref 135–145)

## 2017-10-10 MED ORDER — METOPROLOL TARTRATE 25 MG PO TABS: 25.0000 mg | ORAL_TABLET | Freq: Two times a day (BID) | ORAL | Status: DC

## 2017-10-10 MED ORDER — OXYCODONE-ACETAMINOPHEN 5-325 MG PO TABS: 1.0000 | ORAL_TABLET | Freq: Four times a day (QID) | ORAL | 0 refills | Status: DC

## 2017-10-10 NOTE — Progress Notes (Signed)
Orthopedic Tech Progress Note Patient Details:  Stephen Bowen Dec 20, 1955 [id number] Brace completed by bio-tech. Patient ID: Stephen ApleyJerome D Bowen, male   DOB: Dec 20, 1955, 62 y.o.   MRN: [id number]   [name], [name] 10/10/2017, 4:01 PM

## 2017-10-10 NOTE — Progress Notes (Signed)
PT Cancellation Note  Patient Details Name: Stephen ApleyJerome D Bowen MRN: [id number] DOB: 07/16/1955   Cancelled Treatment:    Reason Eval/Treat Not Completed: Other (comment)(Patient leaving). Transport present in room to take patient to SNF.   [name], PT, DPT Acute Rehabilitation Services  Pager: [phone]    [name] 10/10/2017, 3:05 PM

## 2017-10-10 NOTE — Plan of Care (Signed)
  Problem: Nutrition: Goal: Adequate nutrition will be maintained Outcome: Progressing   Problem: Pain Managment: Goal: General experience of comfort will improve Outcome: Progressing   

## 2017-10-10 NOTE — Progress Notes (Addendum)
Clinical Social Worker facilitated patient discharge including contacting patient family and facility to confirm patient discharge plans.  Clinical information faxed to facility and family agreeable with plan.  CSW arranged [facility] .  RN to call [phone] (pt will go in [phone]) for report prior to discharge.  Clinical Social Worker will sign off for now as social work intervention is no longer needed. Please consult us again if new need arises.  Marrianne MoodAshley Roshawn Ayala, MSW, [employer]CSWA [phone]

## 2017-10-10 NOTE — Discharge Summary (Addendum)
Discharge Summary    Stephen Bowen Dec 20, 1955 61 y.o. male  [id number]  Admission Date: 10/08/2017  Discharge Date: 10/10/17  Physician: Juventino Slovak*  Admission Diagnosis: above knee amputation deterioration   HPI:   This is a 62 y.o. male well-known to me with bilateral lower extremity above-knee amputations.  Most recently he had a right side done this is now healed well.  He previously had a left side done and this needed wound VAC for secondary healing.  He now presents with concern for exposed bone.  He denies any pain.  He remains in a nursing home and [facility].  His brother is along with him today states that he is the one that signed consents.  Hospital Course:  The patient was admitted to the hospital and taken to the operating room on 10/08/2017 and underwent: Revision of left AKA    Findings: There was no gross infection.  There is an occluded PTFE graft that was transected.  I completion all tissue was healthy and the flaps approximated easily.  The pt tolerated the procedure well and was transported to the PACU in good condition.   His dressing was removed on POD 2 and his incision was clean with staples in tact with mild bloody ooze from the mid portion of the incision.    The remainder of the hospital course consisted of increasing intake of solids without difficulty.  CBC    Component Value Date/Time   WBC 8.4 10/10/2017 0235   RBC 3.50 (L) 10/10/2017 0235   HGB 10.7 (L) 10/10/2017 0235   HCT 33.1 (L) 10/10/2017 0235   PLT 375 10/10/2017 0235   MCV 94.6 10/10/2017 0235   MCH 30.6 10/10/2017 0235   MCHC 32.3 10/10/2017 0235   RDW 13.6 10/10/2017 0235   LYMPHSABS 1.8 08/01/2016 0213   MONOABS 0.5 08/01/2016 0213   EOSABS 0.2 08/01/2016 0213   BASOSABS 0.0 08/01/2016 0213    BMET    Component Value Date/Time   NA 137 10/10/2017 0235   K 3.9 10/10/2017 0235   CL 103 10/10/2017 0235   CO2 26 10/10/2017 0235   GLUCOSE 116 (H)  10/10/2017 0235   BUN 8 10/10/2017 0235   CREATININE 0.75 10/10/2017 0235   CREATININE 0.92 11/13/2013 1031   CALCIUM 8.7 (L) 10/10/2017 0235   GFRNONAA >60 10/10/2017 0235   GFRNONAA >89 11/13/2013 1031   GFRAA >60 10/10/2017 0235   GFRAA >89 11/13/2013 1031      Discharge Instructions    Discharge patient   Complete by:  As directed    Discharge disposition:  03-Skilled Nursing Facility   Discharge patient date:  10/10/2017      Discharge Diagnosis:  above knee amputation deterioration  Secondary Diagnosis: Patient Active Problem List   Diagnosis Date Noted  . Non-healing wound of amputation stump (HCC) 10/08/2017  . Gangrene of right foot (HCC) 12/04/2016  . Wound of left leg 09/03/2016  . 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 Aortic atherosclerosis (HCC) 08/01/2016  . Coronary atherosclerosis of native coronary artery 08/01/2016  . Underweight 08/01/2016  . AKI (acute kidney injury) (HCC) 07/30/2016  . Limb ischemia 07/30/2016  . HLD (hyperlipidemia) 07/30/2016  . GERD (gastroesophageal reflux disease) 07/30/2016  . Acute encephalopathy 07/30/2016  . Abdominal pain 07/30/2016  . Pressure injury of skin 06/28/2016  . 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 Aphasia   . Hypotension 06/21/2016  .  Stroke (HCC) 06/21/2016  . Acute MI, anterolateral wall, initial episode of care (HCC)   . 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 Preoperative cardiovascular examination   . PAD (peripheral artery disease) (HCC) 02/13/2016  . Leg pain 02/13/2016  . Hyperkalemia 02/13/2016  . Lactic acid increased 02/13/2016  . Essential hypertension 02/13/2016  . Seborrheic dermatitis 08/31/2015  . Actinic keratosis of multiple sites of head and neck 08/31/2015  . Tobacco use disorder 08/31/2015  . Skin ulcer of scalp (HCC) 08/31/2015  . Atherosclerosis of native arteries of extremity with intermittent claudication (HCC)  07/07/2015  . Abnormal EKG 11/13/2013  . Hypertensive urgency 11/13/2013   Past Medical History:  Diagnosis Date  . Coronary artery disease   . Encephalopathy acute 07/2016  . HTN (hypertension)   . Hyperlipemia   . Myocardial infarction (HCC)   . Non-healing surgical wound    left AKA  . Noncompliance   . PAD (peripheral artery disease) (HCC)    a.  s/p left femoral to PT artery bypass with propaten on 02/16/16.     Allergies as of 10/10/2017   No Known Allergies     Medication List    TAKE these medications   amLODipine 10 MG tablet Commonly known as:  NORVASC Take 1 tablet (10 mg total) by mouth daily.   ASPERCREME LIDOCAINE 4 % Ptch Generic drug:  Lidocaine Apply 1 patch topically daily. Apply to left shoulder   aspirin 81 MG EC tablet Take 1 tablet (81 mg total) by mouth daily.   atorvastatin 40 MG tablet Commonly known as:  LIPITOR Take 1 tablet (40 mg total) by mouth daily at 6 PM.   bisacodyl 5 MG EC tablet Commonly known as:  DULCOLAX Take 1 tablet (5 mg total) by mouth daily as needed for moderate constipation.   clopidogrel 75 MG tablet Commonly known as:  PLAVIX Take 1 tablet (75 mg total) by mouth daily.   docusate sodium 100 MG capsule Commonly known as:  COLACE Take 1 capsule (100 mg total) by mouth daily.   ergocalciferol 50000 units capsule Commonly known as:  VITAMIN D2 Take 50,000 Units by mouth every Friday.   fluconazole 150 MG tablet Commonly known as:  DIFLUCAN Take 150 mg by mouth every Monday.   folic acid 1 MG tablet Commonly known as:  FOLVITE Take 1 tablet (1 mg total) by mouth daily.   HYDROcodone-acetaminophen 5-325 MG tablet Commonly known as:  NORCO/VICODIN Take 1 tablet by mouth every 8 (eight) hours as needed for moderate pain.   lactulose 10 GM/15ML solution Commonly known as:  CHRONULAC Take 30 mLs (20 g total) by mouth 2 (two) times daily.   metoprolol tartrate 25 MG tablet Commonly known as:  LOPRESSOR Take  1 tablet (25 mg total) by mouth 2 (two) times daily.   oxyCODONE-acetaminophen 5-325 MG tablet Commonly known as:  PERCOCET/ROXICET Take 1 tablet by mouth 4 (four) times daily.   pantoprazole 20 MG tablet Commonly known as:  PROTONIX Take 20 mg by mouth daily as needed for heartburn or indigestion.   polyethylene glycol packet Commonly known as:  MIRALAX / GLYCOLAX Take 17 g by mouth daily.   SENNA-PLUS 8.6-50 MG tablet Generic drug:  senna-docusate Take 2 tablets by mouth at bedtime.   spironolactone 25 MG tablet Commonly known as:  ALDACTONE Take 1 tablet (25 mg total) by mouth daily.   TAB-A-VITE PO Take 1 tablet by mouth daily.  Prescriptions given: Roxicet #30 No Refill  Instructions: 1.  Shower daily starting 10/11/17  Disposition: SNF  Patient's condition: is Limited  Follow up: 1. Dr. Randie Heinz in 4 weeks   Doreatha Massed, PA-C [facility] [phone] 10/10/2017  7:48 AM

## 2017-10-10 NOTE — Progress Notes (Signed)
Orthopedic Tech Progress Note Patient Details:  Stephen Bowen 03-22-1956 [id number]  Patient ID: Stephen ApleyJerome D Bowen, male   DOB: 03-22-1956, 62 y.o.   MRN: [id number]   Nikki DomCrawford, Aaminah Forrester 10/10/2017, 9:26 AM Called in bio-tech brace order; spoke with Wylene MenLacey

## 2017-10-10 NOTE — Progress Notes (Addendum)
  Progress Note    10/10/2017 7:40 AM 2 Days Post-Op  Subjective:  Says he has some pain  Tm 99.2 now afebrile VSS 96% RA  Vitals:   10/09/17 2130 10/10/17 0505  BP: 126/85 135/90  Pulse:  100  Resp:  16  Temp:  98.5 F (36.9 C)  SpO2:  96%    Physical Exam: Incisions:  Clean with mild bloody drainage from mid incision; able to lift stump a little   CBC    Component Value Date/Time   WBC 8.4 10/10/2017 0235   RBC 3.50 (L) 10/10/2017 0235   HGB 10.7 (L) 10/10/2017 0235   HCT 33.1 (L) 10/10/2017 0235   PLT 375 10/10/2017 0235   MCV 94.6 10/10/2017 0235   MCH 30.6 10/10/2017 0235   MCHC 32.3 10/10/2017 0235   RDW 13.6 10/10/2017 0235   LYMPHSABS 1.8 08/01/2016 0213   MONOABS 0.5 08/01/2016 0213   EOSABS 0.2 08/01/2016 0213   BASOSABS 0.0 08/01/2016 0213    BMET    Component Value Date/Time   NA 137 10/10/2017 0235   K 3.9 10/10/2017 0235   CL 103 10/10/2017 0235   CO2 26 10/10/2017 0235   GLUCOSE 116 (H) 10/10/2017 0235   BUN 8 10/10/2017 0235   CREATININE 0.75 10/10/2017 0235   CREATININE 0.92 11/13/2013 1031   CALCIUM 8.7 (L) 10/10/2017 0235   GFRNONAA >60 10/10/2017 0235   GFRNONAA >89 11/13/2013 1031   GFRAA >60 10/10/2017 0235   GFRAA >89 11/13/2013 1031    INR    Component Value Date/Time   INR 1.00 09/03/2016 1621     Intake/Output Summary (Last 24 hours) at 10/10/2017 0740 Last data filed at 10/10/2017 0507 Gross per 24 hour  Intake 432.5 ml  Output 900 ml  Net -467.5 ml     Assessment/Plan:  62 y.o. male is s/p left above knee amputation  2 Days Post-Op  -pt doing well and incision has minimal bloody drainage. -will plan for dc back to facility today.   Doreatha MassedSamantha Rhyne, PA-C [facility] [phone] 10/10/2017 7:40 AM    I have independently interviewed and examined patient and agree with PA assessment and plan above.   Brandon C. Randie Heinzain, MD [facility] [place] Office:  [phone] Pager: [phone]

## 2017-10-10 NOTE — Clinical Social Work Placement (Signed)
   CLINICAL SOCIAL WORK PLACEMENT  NOTE  Date:  10/10/2017  Patient Details  Name: Stephen Bowen MRN: [id number] Date of Birth: [birth year]  Clinical Social Work is seeking post-discharge placement for this patient at the Skilled  Nursing Facility level of care (*CSW will initial, date and re-position this form in  chart as items are completed):  Yes   Patient/family provided with [facility] Clinical Social Work Department's list of facilities offering this level of care within the geographic area requested by the patient (or if unable, by the patient's family).  Yes   Patient/family informed of their freedom to choose among providers that offer the needed level of care, that participate in Medicare, Medicaid or managed care program needed by the patient, have an available bed and are willing to accept the patient.  Yes   Patient/family informed of [facility]'s ownership interest in [facility] and [facility], as well as of the fact that they are under no obligation to receive care at these facilities.  PASRR submitted to EDS on       PASRR number received on       Existing PASRR number confirmed on       FL2 transmitted to all facilities in geographic area requested by pt/family on       FL2 transmitted to all facilities within larger geographic area on       Patient informed that his/her managed care company has contracts with or will negotiate with certain facilities, including the following:            Patient/family informed of bed offers received.  [facility]     Physician recommends and patient chooses bed at      Patient to be [facility] on 10/10/17.  Patient to be transferred to facility by facility will pick up patient      Patient family notified on 10/10/17 of transfer.  Name of family member notified:  spoke with jerry (brother)     PHYSICIAN       Additional Comment:     _______________________________________________ Althea CharonAshley C Saiya Crist, LCSW 10/10/2017, 10:32 AM

## 2017-11-08 ENCOUNTER — Encounter: Payer: Self-pay | Admitting: Vascular Surgery

## 2017-11-08 ENCOUNTER — Inpatient Hospital Stay (HOSPITAL_COMMUNITY)
Admission: EM | Admit: 2017-11-08 | Discharge: 2017-11-12 | DRG: 864 | Disposition: A | Discharge status: Skilled Nursing Facility | Payer: Medicaid Other | Source: Skilled Nursing Facility | Attending: Internal Medicine | Admitting: Internal Medicine

## 2017-11-08 ENCOUNTER — Other Ambulatory Visit: Payer: Self-pay

## 2017-11-08 ENCOUNTER — Emergency Department (HOSPITAL_COMMUNITY): Payer: Medicaid Other

## 2017-11-08 ENCOUNTER — Ambulatory Visit (INDEPENDENT_AMBULATORY_CARE_PROVIDER_SITE_OTHER): Payer: Medicaid Other | Admitting: Vascular Surgery

## 2017-11-08 ENCOUNTER — Encounter (HOSPITAL_COMMUNITY): Payer: Self-pay | Admitting: Emergency Medicine

## 2017-11-08 VITALS — BP 113/73 | HR 89 | Temp 97.4°F | Resp 18

## 2017-11-08 DIAGNOSIS — I739 Peripheral vascular disease, unspecified: Secondary | ICD-10-CM | POA: Diagnosis present

## 2017-11-08 DIAGNOSIS — I2510 Atherosclerotic heart disease of native coronary artery without angina pectoris: Secondary | ICD-10-CM | POA: Diagnosis present

## 2017-11-08 DIAGNOSIS — D649 Anemia, unspecified: Secondary | ICD-10-CM | POA: Diagnosis present

## 2017-11-08 DIAGNOSIS — I10 Essential (primary) hypertension: Secondary | ICD-10-CM | POA: Diagnosis present

## 2017-11-08 DIAGNOSIS — E785 Hyperlipidemia, unspecified: Secondary | ICD-10-CM | POA: Diagnosis present

## 2017-11-08 DIAGNOSIS — R000 Tachycardia, unspecified: Secondary | ICD-10-CM | POA: Diagnosis present

## 2017-11-08 DIAGNOSIS — R52 Pain, unspecified: Secondary | ICD-10-CM

## 2017-11-08 DIAGNOSIS — Z8249 Family history of ischemic heart disease and other diseases of the circulatory system: Secondary | ICD-10-CM

## 2017-11-08 DIAGNOSIS — Y835 Amputation of limb(s) as the cause of abnormal reaction of the patient, or of later complication, without mention of misadventure at the time of the procedure: Secondary | ICD-10-CM | POA: Diagnosis present

## 2017-11-08 DIAGNOSIS — I252 Old myocardial infarction: Secondary | ICD-10-CM

## 2017-11-08 DIAGNOSIS — Z681 Body mass index (BMI) 19 or less, adult: Secondary | ICD-10-CM

## 2017-11-08 DIAGNOSIS — Z89612 Acquired absence of left leg above knee: Secondary | ICD-10-CM

## 2017-11-08 DIAGNOSIS — D509 Iron deficiency anemia, unspecified: Secondary | ICD-10-CM | POA: Diagnosis present

## 2017-11-08 DIAGNOSIS — T8130XA Disruption of wound, unspecified, initial encounter: Secondary | ICD-10-CM | POA: Diagnosis present

## 2017-11-08 DIAGNOSIS — Z89611 Acquired absence of right leg above knee: Secondary | ICD-10-CM

## 2017-11-08 DIAGNOSIS — G9341 Metabolic encephalopathy: Secondary | ICD-10-CM | POA: Diagnosis present

## 2017-11-08 DIAGNOSIS — Z823 Family history of stroke: Secondary | ICD-10-CM

## 2017-11-08 DIAGNOSIS — R509 Fever, unspecified: Principal | ICD-10-CM | POA: Diagnosis present

## 2017-11-08 DIAGNOSIS — Y92129 Unspecified place in nursing home as the place of occurrence of the external cause: Secondary | ICD-10-CM

## 2017-11-08 DIAGNOSIS — Z8673 Personal history of transient ischemic attack (TIA), and cerebral infarction without residual deficits: Secondary | ICD-10-CM

## 2017-11-08 DIAGNOSIS — A419 Sepsis, unspecified organism: Secondary | ICD-10-CM | POA: Diagnosis present

## 2017-11-08 DIAGNOSIS — K219 Gastro-esophageal reflux disease without esophagitis: Secondary | ICD-10-CM | POA: Diagnosis present

## 2017-11-08 DIAGNOSIS — S81802A Unspecified open wound, left lower leg, initial encounter: Secondary | ICD-10-CM | POA: Diagnosis present

## 2017-11-08 DIAGNOSIS — Z87891 Personal history of nicotine dependence: Secondary | ICD-10-CM

## 2017-11-08 DIAGNOSIS — I639 Cerebral infarction, unspecified: Secondary | ICD-10-CM | POA: Diagnosis present

## 2017-11-08 DIAGNOSIS — E440 Moderate protein-calorie malnutrition: Secondary | ICD-10-CM

## 2017-11-08 DIAGNOSIS — E43 Unspecified severe protein-calorie malnutrition: Secondary | ICD-10-CM | POA: Diagnosis present

## 2017-11-08 DIAGNOSIS — D72829 Elevated white blood cell count, unspecified: Secondary | ICD-10-CM | POA: Diagnosis present

## 2017-11-08 DIAGNOSIS — F329 Major depressive disorder, single episode, unspecified: Secondary | ICD-10-CM | POA: Diagnosis present

## 2017-11-08 DIAGNOSIS — T8789 Other complications of amputation stump: Secondary | ICD-10-CM

## 2017-11-08 LAB — COMPREHENSIVE METABOLIC PANEL
ALBUMIN: 2.7 g/dL — AB (ref 3.5–5.0)
ALK PHOS: 92 U/L (ref 38–126)
ALT: 25 U/L (ref 17–63)
ANION GAP: 12 (ref 5–15)
AST: 20 U/L (ref 15–41)
BUN: 12 mg/dL (ref 6–20)
CALCIUM: 9.3 mg/dL (ref 8.9–10.3)
CO2: 26 mmol/L (ref 22–32)
CREATININE: 1.1 mg/dL (ref 0.61–1.24)
Chloride: 95 mmol/L — ABNORMAL LOW (ref 101–111)
GFR calc Af Amer: >60 mL/min (ref >60)
GFR calc non Af Amer: >60 mL/min (ref >60)
GLUCOSE: 101 mg/dL — AB (ref 65–99)
Potassium: 4.7 mmol/L (ref 3.5–5.1)
SODIUM: 133 mmol/L — AB (ref 135–145)
Total Bilirubin: 0.8 mg/dL (ref 0.3–1.2)
Total Protein: 7.1 g/dL (ref 6.5–8.1)

## 2017-11-08 LAB — CBC WITH DIFFERENTIAL/PLATELET
ABS IMMATURE GRANULOCYTES: 0.5 10*3/uL — AB (ref 0.0–0.1)
BASOS PCT: 0 %
Basophils Absolute: 0 10*3/uL (ref 0.0–0.1)
Eosinophils Absolute: 0.3 10*3/uL (ref 0.0–0.7)
Eosinophils Relative: 1 %
HEMATOCRIT: 33.1 % — AB (ref 39.0–52.0)
Hemoglobin: 10.6 g/dL — ABNORMAL LOW (ref 13.0–17.0)
Immature Granulocytes: 2 %
LYMPHS ABS: 1.2 10*3/uL (ref 0.7–4.0)
Lymphocytes Relative: 5 %
MCH: 30.2 pg (ref 26.0–34.0)
MCHC: 32 g/dL (ref 30.0–36.0)
MCV: 94.3 fL (ref 78.0–100.0)
MONO ABS: 1.4 10*3/uL — AB (ref 0.1–1.0)
MONOS PCT: 6 %
NEUTROS ABS: 19.3 10*3/uL — AB (ref 1.7–7.7)
Neutrophils Relative %: 86 %
PLATELETS: 719 10*3/uL — AB (ref 150–400)
RBC: 3.51 MIL/uL — ABNORMAL LOW (ref 4.22–5.81)
RDW: 13.8 % (ref 11.5–15.5)
WBC: 22.7 10*3/uL — ABNORMAL HIGH (ref 4.0–10.5)

## 2017-11-08 LAB — I-STAT CG4 LACTIC ACID, ED: Lactic Acid, Venous: 1.38 mmol/L (ref 0.5–1.9)

## 2017-11-08 MED ORDER — VANCOMYCIN HCL IN DEXTROSE 1-5 GM/200ML-% IV SOLN: 1000.0000 mg | Freq: Once | INTRAVENOUS | Status: DC

## 2017-11-08 MED ORDER — VANCOMYCIN HCL IN DEXTROSE 750-5 MG/150ML-% IV SOLN
750.0000 mg | Freq: Once | INTRAVENOUS | Status: AC
  Administered 2017-11-08: 750 mg via INTRAVENOUS
  Filled 2017-11-08: qty 150

## 2017-11-08 MED ORDER — SODIUM CHLORIDE 0.9 % IV BOLUS
1000.0000 mL | Freq: Once | INTRAVENOUS | Status: AC
  Administered 2017-11-08: 1000 mL via INTRAVENOUS

## 2017-11-08 MED ORDER — PIPERACILLIN-TAZOBACTAM 3.375 G IVPB 30 MIN
3.3750 g | Freq: Once | INTRAVENOUS | Status: AC
  Administered 2017-11-08: 3.375 g via INTRAVENOUS
  Filled 2017-11-08: qty 50

## 2017-11-08 MED ORDER — ACETAMINOPHEN 325 MG PO TABS
650.0000 mg | ORAL_TABLET | Freq: Once | ORAL | Status: AC
Start: 2017-11-08 — End: 2017-11-08
  Administered 2017-11-08: 650 mg via ORAL
  Filled 2017-11-08: qty 2

## 2017-11-08 NOTE — ED Provider Notes (Signed)
[facility] EMERGENCY DEPARTMENT Provider Note   CSN: [id number] Arrival date & time: 11/08/17  2200     History   Chief Complaint Chief Complaint  Patient presents with  . Post-op Problem    HPI BRAIDAN RICCIARDI is a 62 y.o. male with a past medical history of CAD, hypertension, bilateral AKA, who presents to ED for evaluation of drainage from left AKA, fever.  Patient is a poor historian so history is obtained from EMS and medical record.  It appears that patient had a revision of his left AKA done on October 10, 2017.  He was discharged home to a skilled nursing facility after this.  However, according to the patient he was sent home 3 days ago.  Again, unsure if this is true as patient is poor historian.  Per medical record, he was seen and evaluated by Dr. Randie Heinz of vascular surgery today and stitches were removed.  He did have some opening of his wound noted which was packed in the office.  He was then given instructions on wound care and dressing changes.  He is febrile and tachycardic on arrival here.  Denies any other complaints.  HPI  Past Medical History:  Diagnosis Date  . Coronary artery disease   . Encephalopathy acute 07/2016  . HTN (hypertension)   . Hyperlipemia   . Myocardial infarction (HCC)   . Non-healing surgical wound    left AKA  . Noncompliance   . PAD (peripheral artery disease) (HCC)    a.  s/p left femoral to PT artery bypass with propaten on 02/16/16.    Patient Active Problem List   Diagnosis Date Noted  . Non-healing wound of amputation stump (HCC) 10/08/2017  . Gangrene of right foot (HCC) 12/04/2016  . Wound of left leg 09/03/2016  . 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 Aortic atherosclerosis (HCC) 08/01/2016  . Coronary atherosclerosis of native coronary artery 08/01/2016  . Underweight 08/01/2016  . AKI (acute kidney injury) (HCC) 07/30/2016  . Limb ischemia 07/30/2016  . HLD (hyperlipidemia) 07/30/2016  . GERD (gastroesophageal  reflux disease) 07/30/2016  . Acute encephalopathy 07/30/2016  . Abdominal pain 07/30/2016  . Pressure injury of skin 06/28/2016  . 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 Aphasia   . Hypotension 06/21/2016  . Stroke (HCC) 06/21/2016  . Acute MI, anterolateral wall, initial episode of care (HCC)   . 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 Preoperative cardiovascular examination   . PAD (peripheral artery disease) (HCC) 02/13/2016  . Leg pain 02/13/2016  . Hyperkalemia 02/13/2016  . Lactic acid increased 02/13/2016  . Essential hypertension 02/13/2016  . Seborrheic dermatitis 08/31/2015  . Actinic keratosis of multiple sites of head and neck 08/31/2015  . Tobacco use disorder 08/31/2015  . Skin ulcer of scalp (HCC) 08/31/2015  . Atherosclerosis of native arteries of extremity with intermittent claudication (HCC) 07/07/2015  . Abnormal EKG 11/13/2013  . Hypertensive urgency 11/13/2013    Past Surgical History:  Procedure Laterality Date  . ABDOMINAL AORTOGRAM W/LOWER EXTREMITY N/A 08/02/2016   Procedure: Abdominal Aortogram w/Lower Extremity;  Surgeon: Maeola Harman, MD;  Location: [facility] INVASIVE CV LAB;  Service: Cardiovascular;  Laterality: N/A;  . ABOVE KNEE LEG AMPUTATION Right 12/04/2016  . AMPUTATION Left 06/25/2016   Procedure: AMPUTATION ABOVE KNEE;  Surgeon: Nada Libman, MD;  Location: [facility] OR;  Service: Vascular;  Laterality: Left;  .  AMPUTATION Right 12/04/2016   Procedure: RIGHT AMPUTATION ABOVE KNEE;  Surgeon: Maeola Harman, MD;  Location: [facility] OR;  Service: Vascular;  Laterality: Right;  . AMPUTATION Left 10/08/2017   Procedure: LEFT ABOVE KNEE AMPUTATION REVISION;  Surgeon: Maeola Harman, MD;  Location: [facility] OR;  Service: Vascular;  Laterality: Left;  . BYPASS GRAFT POPLITEAL TO TIBIAL Left 06/21/2016   Procedure: BYPASS GRAFT POPLITEAL TO TIBIAL USING GORE PROPATEN  GRAFT;  Surgeon: Maeola Harman, MD;  Location: [facility] OR;  Service: Vascular;  Laterality: Left;  . EMBOLECTOMY Left 06/21/2016   Procedure: EMBOLECTOMY left lower extremity.;  Surgeon: Maeola Harman, MD;  Location: [facility] OR;  Service: Vascular;  Laterality: Left;  . ENDARTERECTOMY FEMORAL Left 02/16/2016   Procedure: LEFT FEMORAL ENDARTERECTOMY;  Surgeon: Maeola Harman, MD;  Location: [facility] OR;  Service: Vascular;  Laterality: Left;  . FEMORAL BYPASS  02/16/2016   LEFT FEMORAL-POSTERIOR TIBIAL ARTERY BYPASS  WITH PROPATEN 6 MM X 80 CM VASCULAR RING GRAFT (Left)  . FEMORAL-TIBIAL BYPASS GRAFT Left 02/16/2016   Procedure: LEFT FEMORAL-POSTERIOR TIBIAL ARTERY BYPASS  WITH PROPATEN 6 MM X 80 CM VASCULAR RING GRAFT;  Surgeon: Maeola Harman, MD;  Location: [facility] OR;  Service: Vascular;  Laterality: Left;  . FRACTURE SURGERY    . I&D EXTREMITY Left 09/03/2016   Procedure: IRRIGATION AND DEBRIDEMENT EXTREMITY - LEFT AKA;  Surgeon: Maeola Harman, MD;  Location: [facility] OR;  Service: Vascular;  Laterality: Left;  . PATCH ANGIOPLASTY Left 02/16/2016   Procedure: PATCH ANGIOPLASTY WITH Livia Snellen BIOLOGIC PATCH 1 CM X 6 CM;  Surgeon: Maeola Harman, MD;  Location: [facility] OR;  Service: Vascular;  Laterality: Left;  . PERIPHERAL VASCULAR BALLOON ANGIOPLASTY Right 08/02/2016   Procedure: Peripheral Vascular Balloon Angioplasty;  Surgeon: Maeola Harman, MD;  Location: [facility] INVASIVE CV LAB;  Service: Cardiovascular;  Laterality: Right;  peroneal artery  . PERIPHERAL VASCULAR CATHETERIZATION N/A 07/07/2015   Procedure: Abdominal Aortogram w/Lower Extremity;  Surgeon: Fransisco Hertz, MD;  Location: [facility] INVASIVE CV LAB;  Service: Cardiovascular;  Laterality: N/A;  . PERIPHERAL VASCULAR CATHETERIZATION N/A 02/15/2016   Procedure: Abdominal Aortogram w/Lower Extremity;  Surgeon: Maeola Harman, MD;  Location: [facility] INVASIVE CV LAB;  Service: Cardiovascular;  Laterality:  N/A;  . PERIPHERAL VASCULAR CATHETERIZATION Bilateral 06/20/2016   Procedure: Lower Extremity Angiography;  Surgeon: Maeola Harman, MD;  Location: [facility] INVASIVE CV LAB;  Service: Cardiovascular;  Laterality: Bilateral;  limited runoff on rt leg thrombolysis lt leg bypass graft  . PERIPHERAL VASCULAR INTERVENTION Right 08/02/2016   Procedure: Peripheral Vascular Intervention;  Surgeon: Maeola Harman, MD;  Location: [facility] INVASIVE CV LAB;  Service: Cardiovascular;  Laterality: Right;  Femoral , popliteal  . 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        Home Medications    Prior to Admission medications   Medication Sig Start Date End Date Taking? Authorizing Provider  amLODipine (NORVASC) 10 MG tablet Take 1 tablet (10 mg total) by mouth daily. 07/07/16  Yes Rhyne, Ames Coupe, PA-C  aspirin EC 81 MG EC tablet Take 1 tablet (81 mg total) by mouth daily. 07/07/16  Yes Rhyne, Ames Coupe, PA-C  atorvastatin (LIPITOR) 40 MG tablet Take 1 tablet (40 mg total) by mouth daily at 6 PM. 02/18/16  Yes Kirby Crigler, Mir Mohammed, MD  cholecalciferol (VITAMIN D) 1000 units tablet Take 1,000 Units by mouth daily.   Yes [provider]  ciprofloxacin (CIPRO) 500 MG tablet Take 500 mg by mouth 2 (two) times daily.   Yes [provider]  clindamycin (CLEOCIN) 150 MG capsule Take 150 mg by mouth 4 (four) times daily.   Yes [provider]  clindamycin (CLEOCIN) 300 MG capsule Take 300 mg by mouth 4 (four) times daily.   Yes [provider]  clopidogrel (PLAVIX) 75 MG tablet Take 1 tablet (75 mg total) by mouth daily. 07/07/16  Yes Rhyne, Samantha J, PA-C  docusate sodium (COLACE) 100 MG capsule Take 1 capsule (100 mg total) by mouth daily. 02/18/16  Yes Kirby Crigler, Mir Mohammed, MD  folic acid (FOLVITE) 1 MG tablet Take 1 tablet (1 mg total) by mouth daily. 07/07/16  Yes Rhyne, Samantha J, PA-C  Lidocaine (ASPERCREME  LIDOCAINE) 4 % PTCH Apply 1 patch topically daily. Apply to left shoulder   Yes [provider]  metoprolol tartrate (LOPRESSOR) 25 MG tablet Take 1 tablet (25 mg total) by mouth 2 (two) times daily. Patient taking differently: Take 25 mg by mouth 2 (two) times daily. Hold for SBP<100, pulse <60 10/10/17  Yes Rhyne, Samantha J, PA-C  mirtazapine (REMERON) 15 MG tablet Take 15 mg by mouth at bedtime.   Yes [provider]  Multiple Vitamin (TAB-A-VITE PO) Take 1 tablet by mouth daily.   Yes [provider]  oxyCODONE-acetaminophen (PERCOCET/ROXICET) 5-325 MG tablet Take 1 tablet by mouth 4 (four) times daily. 10/10/17  Yes Rhyne, Samantha J, PA-C  pantoprazole (PROTONIX) 20 MG tablet Take 20 mg by mouth daily as needed for heartburn or indigestion.    Yes [provider]  polyethylene glycol (MIRALAX / GLYCOLAX) packet Take 17 g by mouth daily.   Yes [provider]  PROTEIN PO Take 30 mLs by mouth 3 (three) times daily.   Yes [provider]  senna-docusate (SENNA-PLUS) 8.6-50 MG tablet Take 2 tablets by mouth at bedtime.   Yes [provider]  spironolactone (ALDACTONE) 25 MG tablet Take 1 tablet (25 mg total) by mouth daily. 07/07/16  Yes Rhyne, Samantha J, PA-C  UNABLE TO FIND Take 60 mLs by mouth 3 (three) times daily. Med Name: Medpass 2.0   Yes [provider]  bisacodyl (DULCOLAX) 5 MG EC tablet Take 1 tablet (5 mg total) by mouth daily as needed for moderate constipation. Patient not taking: Reported on 10/01/2017 02/18/16   Colon Branch, MD  HYDROcodone-acetaminophen (NORCO/VICODIN) 5-325 MG tablet Take 1 tablet by mouth every 8 (eight) hours as needed for moderate pain. Patient not taking: Reported on 10/01/2017 12/06/16   Lars Mage, PA-C  lactulose (CHRONULAC) 10 GM/15ML solution Take 30 mLs (20 g total) by mouth 2 (two) times daily. Patient not taking: Reported on 10/01/2017 08/03/16   Rama, Maryruth Bun, MD     Family History Family History  Problem Relation Age of Onset  . Cancer Mother        deceased in 71s   . Heart attack Mother        diseased  . Heart disease Father   . Stroke Sister        age 89s  . Heart disease Brother        pacemaker in his 40s    Social History Social History   Tobacco Use  . Smoking status: Former Smoker    Packs/day: 3.00    Years: 40.00    Pack years: 120.00  . Smokeless tobacco: Never Used  . Tobacco  comment: cutting back amount he is buying  Substance Use Topics  . Alcohol use: Not Currently    Alcohol/week: 8.4 oz    Types: 14 Cans of beer per week    Comment: Drinks a 40 oz "tallboy" daily   [id number]  I quitdrinking  . Drug use: No     Allergies   Patient has no known allergies.   Review of Systems Review of Systems  Constitutional: Positive for fever. Negative for appetite change and chills.  HENT: Negative for ear pain, rhinorrhea, sneezing and sore throat.   Eyes: Negative for photophobia and visual disturbance.  Respiratory: Negative for cough, chest tightness, shortness of breath and wheezing.   Cardiovascular: Negative for chest pain and palpitations.  Gastrointestinal: Negative for abdominal pain, blood in stool, constipation, diarrhea, nausea and vomiting.  Genitourinary: Negative for dysuria, hematuria and urgency.  Musculoskeletal: Negative for myalgias.  Skin: Positive for wound. Negative for rash.  Neurological: Negative for dizziness, weakness and light-headedness.     Physical Exam Updated Vital Signs BP 111/79   Pulse (!) 118   Temp (!) 103 F (39.4 C) (Rectal)   Resp 19   Ht  (1.676 m)   Wt 38.3 kg (84 lb 7 oz)   SpO2 97%   BMI 13.63 kg/m   Physical Exam  Constitutional: He appears well-developed and well-nourished. No distress.  HENT:  Head: Normocephalic and atraumatic.  Nose: Nose normal.  Eyes: Conjunctivae and EOM are normal. Left eye exhibits no discharge. No scleral icterus.  Neck:  Normal range of motion. Neck supple.  Cardiovascular: Regular rhythm, normal heart sounds and intact distal pulses. Tachycardia present. Exam reveals no gallop and no friction rub.  No murmur heard. Pulmonary/Chest: Effort normal and breath sounds normal. No respiratory distress.  Abdominal: Soft. Bowel sounds are normal. He exhibits no distension. There is no tenderness. There is no guarding.  Musculoskeletal: Normal range of motion. He exhibits no edema.  Bilateral AKA. Wound of L AKA as noted.  Neurological: He is alert. He exhibits normal muscle tone. Coordination normal.  Skin: Skin is warm and dry. No rash noted.  Psychiatric: He has a normal mood and affect.  Nursing note and vitals reviewed.      ED Treatments / Results  Labs (all labs ordered are listed, but only abnormal results are displayed) Labs Reviewed  COMPREHENSIVE METABOLIC PANEL - Abnormal; Notable for the following components:      Result Value   Sodium 133 (*)    Chloride 95 (*)    Glucose, Bld 101 (*)    Albumin 2.7 (*)    All other components within normal limits  CBC WITH DIFFERENTIAL/PLATELET - Abnormal; Notable for the following components:   WBC 22.7 (*)    RBC 3.51 (*)    Hemoglobin 10.6 (*)    HCT 33.1 (*)    Platelets 719 (*)    Neutro Abs 19.3 (*)    Monocytes Absolute 1.4 (*)    Abs Immature Granulocytes 0.5 (*)    All other components within normal limits  CULTURE, BLOOD (ROUTINE X 2)  CULTURE, BLOOD (ROUTINE X 2)  URINE CULTURE  URINALYSIS, ROUTINE W REFLEX MICROSCOPIC  I-STAT CG4 LACTIC ACID, ED  I-STAT CG4 LACTIC ACID, ED    EKG EKG Interpretation  Date/Time:  Friday Nov 08 2017 22:58:21 EDT Ventricular Rate:  144 PR Interval:    QRS Duration: 73 QT Interval:  281 QTC Calculation: 435 R Axis:   41 Text  Interpretation:  Sinus tachycardia SINCE LAST TRACING HEART RATE HAS INCREASED Confirmed by Doug Sou [phone]) on 11/08/2017 11:51:07 PM   Radiology Dg Chest 1  View  Result Date: 11/08/2017 CLINICAL DATA:  Pain and fever EXAM: CHEST  1 VIEW COMPARISON:  10/18/2012 FINDINGS: The heart size and mediastinal contours are within normal limits. The lungs are mildly hyperinflated without acute pulmonary consolidation or CHF. Remote left-sided rib fractures. IMPRESSION: No active disease. Electronically Signed   By: Tollie Eth M.D.   On: 11/08/2017 23:28    Procedures Procedures (including critical care time)  CRITICAL CARE Performed by: Dietrich Pates   Total critical care time: 45 minutes  Critical care time was exclusive of separately billable procedures and treating other patients.  Critical care was necessary to treat or prevent imminent or life-threatening deterioration.  Critical care was time spent personally by me on the following activities: development of treatment plan with patient and/or surrogate as well as nursing, discussions with consultants, evaluation of patient's response to treatment, examination of patient, obtaining history from patient or surrogate, ordering and performing treatments and interventions, ordering and review of laboratory studies, ordering and review of radiographic studies, pulse oximetry and re-evaluation of patient's condition.   Medications Ordered in ED Medications  sodium chloride 0.9 % bolus 1,000 mL (1,000 mLs Intravenous New Bag/Given 11/08/17 2357)  piperacillin-tazobactam (ZOSYN) IVPB 3.375 g (3.375 g Intravenous New Bag/Given 11/08/17 2345)  sodium chloride 0.9 % bolus 1,000 mL (0 mLs Intravenous Stopped 11/08/17 2344)  vancomycin (VANCOCIN) IVPB 750 mg/150 ml premix (750 mg Intravenous New Bag/Given 11/08/17 2347)  acetaminophen (TYLENOL) tablet 650 mg (650 mg Oral Given 11/08/17 2349)     Initial Impression / Assessment and Plan / ED Course  I have reviewed the triage vital signs and the nursing notes.  Pertinent labs & imaging results that were available during my care of the patient were reviewed by  me and considered in my medical decision making (see chart for details).  Clinical Course as of Nov 09 48  Fri Nov 08, 2017  2249 Rectal temperature 103F. Tachycardic at 143.   [HK]  2348 Spoke to Dr. Arbie Cookey of vascular surgery who recommends admission to hospitalist.  He does not believe that the wound is the source of the infection based on the appearance.  He will consult in the morning.   [HK]    Clinical Course User Index [HK] Dietrich Pates, PA-C    Patient presents to ED for evaluation of pain and bleeding at the site of his AKA stump on the left side.  He reports chills.  Denies any other complaints.  Patient was tachycardic to 140s and febrile to 103 rectally.  Unclear of source of infection at this time.  Physical exam findings of AKA stump as indicated in the image.  No erythema or warmth noted.  Surrounding ecchymosis is noted.  Wound is open and draining blood.  Spoke to vascular surgery who will consult on the patient but does not believe that the site is the cause of the infection.  Chest x-ray unremarkable.  Leukocytosis at 22.  Patient will need to be admitted for sepsis with unknown source at this time.  Urinalysis still pending. Patient discussed with and seen by my attending, Dr. Ethelda Chick.  Portions of this note were generated with Scientist, clinical (histocompatibility and immunogenetics). Dictation errors may occur despite best attempts at proofreading.   Final Clinical Impressions(s) / ED Diagnoses   Final diagnoses:  Pain    ED  Discharge Orders    None       Dietrich Pates, PA-C 11/09/17 5284    Doug Sou, MD 11/09/17 [phone]

## 2017-11-08 NOTE — ED Triage Notes (Signed)
Per Joya San from [facility], pt experiencing bleeding from left BKA. Surgery performed in April. Had an appointment to have the remaining stiches removed yesterday. PTAR reports chills and feverish. Temperature 100 orally on arrival.

## 2017-11-08 NOTE — Progress Notes (Signed)
  Subjective:     Patient ID: Stephen Bowen, male   DOB: 01-09-1956, 62 y.o.   MRN: [id number]  HPI 62 year old male status post left above-knee amputation revision.  He follows up today without complaints.   Review of Systems No complaints today    Objective:   Physical Exam Awake and alert Nonlabored respirations Left AKA site healing well medially and centrally but on left lateral aspect there is dry eschar with some drainage.      Assessment/plan     I open the left lateral aspect 3 x 2 cm and packed this.  He will need at least daily 62 year old male status post revision left AKA. Staples were removed today.  Wet-to-dry dressing changes and we will have him follow-up in 3 to 4 weeks for wound check.  Jailin Moomaw C. Randie Heinz, MD [facility] [place] Office: [phone] Pager: [phone]

## 2017-11-08 NOTE — ED Provider Notes (Addendum)
he complains of pain and bleeding at the distal most aspect of AKA stump, left side.  He also complains of chills.  He denies cough denies abdominal pain denies nausea vomiting denies headache.  No other associated symptoms.  Code sepsis called based on sirs criteria of pulse, temperature.  Source of infection unclear.  On exam alert nontoxic HEENT exam no facial asymmetry oropharynx normal neck supple trachea midline lungs clear to auscultation heart tachycardic regular rhythm abdomen nondistended nontender.  Genitalia normal male left lower extremity with AKA stump.  With fresh surgical wound which is clean appearing.   wound is open and draining venous blood from posterior most aspect.  Right lower extremity with well-healed AKA.  Both upper extremities without redness pain or tenderness neurovascular intact skin warm dry, no rash   Doug Sou, MD 11/08/17 9604    Doug Sou, MD 11/09/17 [phone]

## 2017-11-09 ENCOUNTER — Inpatient Hospital Stay (HOSPITAL_COMMUNITY): Payer: Medicaid Other

## 2017-11-09 DIAGNOSIS — E440 Moderate protein-calorie malnutrition: Secondary | ICD-10-CM | POA: Diagnosis not present

## 2017-11-09 DIAGNOSIS — E43 Unspecified severe protein-calorie malnutrition: Secondary | ICD-10-CM | POA: Diagnosis present

## 2017-11-09 DIAGNOSIS — Y92129 Unspecified place in nursing home as the place of occurrence of the external cause: Secondary | ICD-10-CM | POA: Diagnosis not present

## 2017-11-09 DIAGNOSIS — I252 Old myocardial infarction: Secondary | ICD-10-CM | POA: Diagnosis not present

## 2017-11-09 DIAGNOSIS — Z681 Body mass index (BMI) 19 or less, adult: Secondary | ICD-10-CM | POA: Diagnosis not present

## 2017-11-09 DIAGNOSIS — E785 Hyperlipidemia, unspecified: Secondary | ICD-10-CM | POA: Diagnosis present

## 2017-11-09 DIAGNOSIS — D649 Anemia, unspecified: Secondary | ICD-10-CM | POA: Diagnosis present

## 2017-11-09 DIAGNOSIS — I63433 Cerebral infarction due to embolism of bilateral posterior cerebral arteries: Secondary | ICD-10-CM | POA: Diagnosis not present

## 2017-11-09 DIAGNOSIS — Z8673 Personal history of transient ischemic attack (TIA), and cerebral infarction without residual deficits: Secondary | ICD-10-CM | POA: Diagnosis not present

## 2017-11-09 DIAGNOSIS — R000 Tachycardia, unspecified: Secondary | ICD-10-CM | POA: Diagnosis present

## 2017-11-09 DIAGNOSIS — R509 Fever, unspecified: Secondary | ICD-10-CM | POA: Diagnosis present

## 2017-11-09 DIAGNOSIS — Z823 Family history of stroke: Secondary | ICD-10-CM | POA: Diagnosis not present

## 2017-11-09 DIAGNOSIS — Z87891 Personal history of nicotine dependence: Secondary | ICD-10-CM | POA: Diagnosis not present

## 2017-11-09 DIAGNOSIS — A419 Sepsis, unspecified organism: Secondary | ICD-10-CM | POA: Diagnosis not present

## 2017-11-09 DIAGNOSIS — F329 Major depressive disorder, single episode, unspecified: Secondary | ICD-10-CM | POA: Diagnosis present

## 2017-11-09 DIAGNOSIS — G9341 Metabolic encephalopathy: Secondary | ICD-10-CM | POA: Diagnosis present

## 2017-11-09 DIAGNOSIS — I10 Essential (primary) hypertension: Secondary | ICD-10-CM | POA: Diagnosis present

## 2017-11-09 DIAGNOSIS — D509 Iron deficiency anemia, unspecified: Secondary | ICD-10-CM | POA: Diagnosis present

## 2017-11-09 DIAGNOSIS — Y835 Amputation of limb(s) as the cause of abnormal reaction of the patient, or of later complication, without mention of misadventure at the time of the procedure: Secondary | ICD-10-CM | POA: Diagnosis present

## 2017-11-09 DIAGNOSIS — S81802A Unspecified open wound, left lower leg, initial encounter: Secondary | ICD-10-CM | POA: Diagnosis present

## 2017-11-09 DIAGNOSIS — Z89611 Acquired absence of right leg above knee: Secondary | ICD-10-CM | POA: Diagnosis not present

## 2017-11-09 DIAGNOSIS — K219 Gastro-esophageal reflux disease without esophagitis: Secondary | ICD-10-CM | POA: Diagnosis present

## 2017-11-09 DIAGNOSIS — I739 Peripheral vascular disease, unspecified: Secondary | ICD-10-CM | POA: Diagnosis present

## 2017-11-09 DIAGNOSIS — Z8249 Family history of ischemic heart disease and other diseases of the circulatory system: Secondary | ICD-10-CM | POA: Diagnosis not present

## 2017-11-09 DIAGNOSIS — I2510 Atherosclerotic heart disease of native coronary artery without angina pectoris: Secondary | ICD-10-CM | POA: Diagnosis present

## 2017-11-09 DIAGNOSIS — R52 Pain, unspecified: Secondary | ICD-10-CM | POA: Diagnosis present

## 2017-11-09 DIAGNOSIS — Z89612 Acquired absence of left leg above knee: Secondary | ICD-10-CM | POA: Diagnosis not present

## 2017-11-09 DIAGNOSIS — T8130XA Disruption of wound, unspecified, initial encounter: Secondary | ICD-10-CM | POA: Diagnosis present

## 2017-11-09 DIAGNOSIS — D72829 Elevated white blood cell count, unspecified: Secondary | ICD-10-CM | POA: Diagnosis present

## 2017-11-09 LAB — URINALYSIS, ROUTINE W REFLEX MICROSCOPIC
BACTERIA UA: NONE SEEN
Bilirubin Urine: NEGATIVE
GLUCOSE, UA: NEGATIVE mg/dL
KETONES UR: 20 mg/dL — AB
Leukocytes, UA: NEGATIVE
NITRITE: NEGATIVE
PROTEIN: NEGATIVE mg/dL
Specific Gravity, Urine: 1.016 (ref 1.005–1.030)
pH: 5 (ref 5.0–8.0)

## 2017-11-09 LAB — BASIC METABOLIC PANEL
ANION GAP: 11 (ref 5–15)
BUN: 8 mg/dL (ref 6–20)
CHLORIDE: 102 mmol/L (ref 101–111)
CO2: 24 mmol/L (ref 22–32)
Calcium: 8.1 mg/dL — ABNORMAL LOW (ref 8.9–10.3)
Creatinine, Ser: 0.8 mg/dL (ref 0.61–1.24)
GFR calc non Af Amer: >60 mL/min (ref >60)
Glucose, Bld: 109 mg/dL — ABNORMAL HIGH (ref 65–99)
Potassium: 4.3 mmol/L (ref 3.5–5.1)
Sodium: 137 mmol/L (ref 135–145)

## 2017-11-09 LAB — CBC
HCT: 23.4 % — ABNORMAL LOW (ref 39.0–52.0)
Hemoglobin: 7.5 g/dL — ABNORMAL LOW (ref 13.0–17.0)
MCH: 30.5 pg (ref 26.0–34.0)
MCHC: 32.1 g/dL (ref 30.0–36.0)
MCV: 95.1 fL (ref 78.0–100.0)
Platelets: 614 10*3/uL — ABNORMAL HIGH (ref 150–400)
RBC: 2.46 MIL/uL — AB (ref 4.22–5.81)
RDW: 13.7 % (ref 11.5–15.5)
WBC: 23.5 10*3/uL — ABNORMAL HIGH (ref 4.0–10.5)

## 2017-11-09 LAB — CBG MONITORING, ED: GLUCOSE-CAPILLARY: 91 mg/dL (ref 65–99)

## 2017-11-09 LAB — TROPONIN I
Troponin I: <0.03 ng/mL (ref <0.03)
Troponin I: <0.03 ng/mL (ref <0.03)

## 2017-11-09 LAB — LACTIC ACID, PLASMA: LACTIC ACID, VENOUS: 0.7 mmol/L (ref 0.5–1.9)

## 2017-11-09 LAB — AMMONIA: Ammonia: 22 umol/L (ref 9–35)

## 2017-11-09 LAB — PROCALCITONIN: Procalcitonin: 0.4 ng/mL

## 2017-11-09 LAB — PROTIME-INR
INR: 1.15
PROTHROMBIN TIME: 14.6 s (ref 11.4–15.2)

## 2017-11-09 LAB — MRSA PCR SCREENING: MRSA BY PCR: NEGATIVE

## 2017-11-09 LAB — SEDIMENTATION RATE: SED RATE: 134 mm/h — AB (ref 0–16)

## 2017-11-09 LAB — BRAIN NATRIURETIC PEPTIDE: B Natriuretic Peptide: 48.8 pg/mL (ref 0.0–100.0)

## 2017-11-09 LAB — C-REACTIVE PROTEIN: CRP: 23.9 mg/dL — ABNORMAL HIGH (ref <1.0)

## 2017-11-09 LAB — I-STAT CG4 LACTIC ACID, ED: LACTIC ACID, VENOUS: 0.69 mmol/L (ref 0.5–1.9)

## 2017-11-09 MED ORDER — POLYETHYLENE GLYCOL 3350 17 G PO PACK
17.0000 g | PACK | Freq: Every day | ORAL | Status: DC | PRN
Start: 2017-11-09 — End: 2017-11-13

## 2017-11-09 MED ORDER — VITAMIN D 1000 UNITS PO TABS
1000.0000 [IU] | ORAL_TABLET | Freq: Every day | ORAL | Status: DC
  Administered 2017-11-09 – 2017-11-12 (×4): 1000 [IU] via ORAL
  Filled 2017-11-09 (×4): qty 1

## 2017-11-09 MED ORDER — ASPIRIN EC 81 MG PO TBEC
81.0000 mg | DELAYED_RELEASE_TABLET | Freq: Every day | ORAL | Status: DC
  Administered 2017-11-09 – 2017-11-12 (×4): 81 mg via ORAL
  Filled 2017-11-09 (×4): qty 1

## 2017-11-09 MED ORDER — MIRTAZAPINE 15 MG PO TABS
15.0000 mg | ORAL_TABLET | Freq: Every day | ORAL | Status: DC
  Administered 2017-11-09 – 2017-11-11 (×4): 15 mg via ORAL
  Filled 2017-11-09 (×5): qty 1

## 2017-11-09 MED ORDER — OXYCODONE-ACETAMINOPHEN 5-325 MG PO TABS
1.0000 | ORAL_TABLET | Freq: Four times a day (QID) | ORAL | Status: DC
  Administered 2017-11-09: 1 via ORAL
  Filled 2017-11-09: qty 1

## 2017-11-09 MED ORDER — PRO-STAT SUGAR FREE PO LIQD
30.0000 mL | Freq: Two times a day (BID) | ORAL | Status: DC
  Administered 2017-11-09 – 2017-11-11 (×5): 30 mL via ORAL
  Filled 2017-11-09 (×9): qty 30

## 2017-11-09 MED ORDER — OXYCODONE-ACETAMINOPHEN 5-325 MG PO TABS
1.0000 | ORAL_TABLET | Freq: Four times a day (QID) | ORAL | Status: DC | PRN
  Administered 2017-11-11 – 2017-11-12 (×2): 1 via ORAL
  Filled 2017-11-09 (×2): qty 1

## 2017-11-09 MED ORDER — FOLIC ACID 1 MG PO TABS
1.0000 mg | ORAL_TABLET | Freq: Every day | ORAL | Status: DC
  Administered 2017-11-09 – 2017-11-12 (×4): 1 mg via ORAL
  Filled 2017-11-09 (×4): qty 1

## 2017-11-09 MED ORDER — PIPERACILLIN-TAZOBACTAM 3.375 G IVPB
3.3750 g | Freq: Three times a day (TID) | INTRAVENOUS | Status: DC
Start: 2017-11-09 — End: 2017-11-11
  Administered 2017-11-09 – 2017-11-11 (×7): 3.375 g via INTRAVENOUS
  Filled 2017-11-09 (×10): qty 50

## 2017-11-09 MED ORDER — LIDOCAINE 5 % EX PTCH
1.0000 | MEDICATED_PATCH | Freq: Every day | CUTANEOUS | Status: DC
  Administered 2017-11-10 – 2017-11-12 (×2): 1 via TRANSDERMAL
  Filled 2017-11-09 (×4): qty 1

## 2017-11-09 MED ORDER — ZOLPIDEM TARTRATE 5 MG PO TABS: 5.0000 mg | ORAL_TABLET | Freq: Every evening | ORAL | Status: DC | PRN

## 2017-11-09 MED ORDER — ATORVASTATIN CALCIUM 40 MG PO TABS
40.0000 mg | ORAL_TABLET | Freq: Every day | ORAL | Status: DC
  Administered 2017-11-09 – 2017-11-12 (×4): 40 mg via ORAL
  Filled 2017-11-09 (×5): qty 1

## 2017-11-09 MED ORDER — VANCOMYCIN HCL IN DEXTROSE 750-5 MG/150ML-% IV SOLN
750.0000 mg | INTRAVENOUS | Status: DC
  Administered 2017-11-10 – 2017-11-11 (×2): 750 mg via INTRAVENOUS
  Filled 2017-11-09 (×6): qty 150

## 2017-11-09 MED ORDER — METOPROLOL TARTRATE 25 MG PO TABS
25.0000 mg | ORAL_TABLET | Freq: Two times a day (BID) | ORAL | Status: DC
  Administered 2017-11-09 – 2017-11-12 (×8): 25 mg via ORAL
  Filled 2017-11-09 (×8): qty 1

## 2017-11-09 MED ORDER — ONDANSETRON HCL 4 MG/2ML IJ SOLN: 4.0000 mg | Freq: Three times a day (TID) | INTRAMUSCULAR | Status: DC | PRN

## 2017-11-09 MED ORDER — PANTOPRAZOLE SODIUM 20 MG PO TBEC: 20.0000 mg | DELAYED_RELEASE_TABLET | Freq: Every day | ORAL | Status: DC | PRN

## 2017-11-09 MED ORDER — HYDRALAZINE HCL 20 MG/ML IJ SOLN: 5.0000 mg | INTRAMUSCULAR | Status: DC | PRN

## 2017-11-09 MED ORDER — ADULT MULTIVITAMIN W/MINERALS CH
1.0000 | ORAL_TABLET | Freq: Every day | ORAL | Status: DC
  Administered 2017-11-09 – 2017-11-12 (×4): 1 via ORAL
  Filled 2017-11-09 (×4): qty 1

## 2017-11-09 MED ORDER — ACETAMINOPHEN 325 MG PO TABS
650.0000 mg | ORAL_TABLET | Freq: Four times a day (QID) | ORAL | Status: DC | PRN
  Administered 2017-11-11 – 2017-11-12 (×2): 650 mg via ORAL
  Filled 2017-11-09 (×2): qty 2

## 2017-11-09 MED ORDER — DOCUSATE SODIUM 100 MG PO CAPS
100.0000 mg | ORAL_CAPSULE | Freq: Every day | ORAL | Status: DC
  Administered 2017-11-09 – 2017-11-11 (×3): 100 mg via ORAL
  Filled 2017-11-09 (×4): qty 1

## 2017-11-09 MED ORDER — LACTULOSE 10 GM/15ML PO SOLN: 20.0000 g | Freq: Two times a day (BID) | ORAL | Status: DC

## 2017-11-09 NOTE — Progress Notes (Signed)
Pharmacy Antibiotic Note  Stephen Bowen is a 62 y.o. male admitted on 11/08/2017 with sepsis.  Pharmacy has been consulted for vancomycin and zosyn dosing. Initial doses ordered in the ED  Plan: Continue zosyn 3.375 gm IV q8 hours Continue vancomycin 750 mg IV q24 hours F/u renal function, cultures and clinical course  Height:  (167.6 cm) Weight: 84 lb 7 oz (38.3 kg) IBW/kg (Calculated) : 63.8  Temp (24hrs), Avg:100.1 F (37.8 C), Min:97.4 F (36.3 C), Max:103 F (39.4 C)  Recent Labs  Lab 11/08/17 2245 11/08/17 2259  WBC 22.7*  --   CREATININE 1.10  --   LATICACIDVEN  --  1.38    Estimated Creatinine Clearance: 38.2 mL/min (by C-G formula based on SCr of 1.1 mg/dL).    No Known Allergies  Thank you for allowing pharmacy to be a part of this patient's care.  Talbert Cage Poteet 11/09/2017 1:46 AM

## 2017-11-09 NOTE — ED Notes (Signed)
Provider at bedside

## 2017-11-09 NOTE — H&P (Signed)
History and Physical    Stephen Bowen NFA:[id number] DOB: 07-23-1955 DOA: 11/08/2017  Referring MD/NP/PA:   PCP: Shelbie Ammons, MD   Patient coming from:  The patient is coming from SNF (possibly).  At baseline, pt is dependent for most of ADL.        Chief Complaint: fever, surgical wound problem and AMS  HPI: Stephen Bowen is a 62 y.o. male with medical history significant of hypertension, hyperlipidemia, GERD, depression, CAD, PVD, s/p of bilateral AKA, dCHF, who  presents with surgical wound problem and AMS.  Patient has AMS, and is a poor historian and is unable to provide accurate medical history, therefore, most of the history is obtained by discussing the case with ED physician, per EMS report, and with the nursing staff.   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 Per EDP, "he was seen and evaluated by Dr. Randie Heinz of vascular surgery today and stitches were removed.  He did have some opening of his wound noted which was packed in the office". Today he obviously developed fever with temperature 103 in ED. When I saw pt in ED, he is confused, not oriented x 3. He has wound dehiscence in the left leg stump, with some bleeding, but no pus.  Patient answers some questions, not sure if his answer is reliable or not.  He denied chest pain, shortness breath, abdominal pain.  No active cough, nausea vomiting or diarrhea noted.  He moves all extremities.  ED Course: pt was found to have WBC 22.7, lactic acid of 1.37, creatinine 1.10, negative urinalysis, temperature 103, tachycardia, no tachypnea, oxygen saturation 97% on room air, negative chest x-ray, blood pressure normal.  Patient is admitted to telemetry bed as inpatient.  # CT-head is negative for acute intracranial abnormalities, but showed chronic infarct at the left occipital lobe, reflecting remote infarct.  Review of Systems: Cannot be reviewed due to altered mental status.  Allergy: No Known  Allergies  Past Medical History:  Diagnosis Date  . Coronary artery disease   . Encephalopathy acute 07/2016  . HTN (hypertension)   . Hyperlipemia   . Myocardial infarction (HCC)   . Non-healing surgical wound    left AKA  . Noncompliance   . PAD (peripheral artery disease) (HCC)    a.  s/p left femoral to PT artery bypass with propaten on 02/16/16.    Past Surgical History:  Procedure Laterality Date  . ABDOMINAL AORTOGRAM W/LOWER EXTREMITY N/A 08/02/2016   Procedure: Abdominal Aortogram w/Lower Extremity;  Surgeon: Maeola Harman, MD;  Location: [facility] INVASIVE CV LAB;  Service: Cardiovascular;  Laterality: N/A;  . ABOVE KNEE LEG AMPUTATION Right 12/04/2016  . AMPUTATION Left 06/25/2016   Procedure: AMPUTATION ABOVE KNEE;  Surgeon: Nada Libman, MD;  Location: [facility] OR;  Service: Vascular;  Laterality: Left;  . AMPUTATION Right 12/04/2016   Procedure: RIGHT AMPUTATION ABOVE KNEE;  Surgeon: Maeola Harman, MD;  Location: [facility] OR;  Service: Vascular;  Laterality: Right;  . AMPUTATION Left 10/08/2017   Procedure: LEFT ABOVE KNEE AMPUTATION REVISION;  Surgeon: Maeola Harman, MD;  Location: [facility] OR;  Service: Vascular;  Laterality: Left;  . BYPASS GRAFT POPLITEAL TO TIBIAL Left 06/21/2016   Procedure: BYPASS GRAFT POPLITEAL TO TIBIAL USING GORE PROPATEN GRAFT;  Surgeon: Maeola Harman, MD;  Location: [facility] OR;  Service: Vascular;  Laterality: Left;  . EMBOLECTOMY Left 06/21/2016   Procedure: EMBOLECTOMY left lower extremity.;  Surgeon: Maeola Harman, MD;  Location: [facility] OR;  Service: Vascular;  Laterality: Left;  . ENDARTERECTOMY FEMORAL Left 02/16/2016   Procedure: LEFT FEMORAL ENDARTERECTOMY;  Surgeon: Maeola Harman, MD;  Location: [facility] OR;  Service: Vascular;  Laterality: Left;  . FEMORAL BYPASS  02/16/2016   LEFT FEMORAL-POSTERIOR TIBIAL ARTERY BYPASS  WITH PROPATEN 6 MM X 80 CM VASCULAR RING GRAFT (Left)  . FEMORAL-TIBIAL BYPASS GRAFT  Left 02/16/2016   Procedure: LEFT FEMORAL-POSTERIOR TIBIAL ARTERY BYPASS  WITH PROPATEN 6 MM X 80 CM VASCULAR RING GRAFT;  Surgeon: Maeola Harman, MD;  Location: [facility] OR;  Service: Vascular;  Laterality: Left;  . FRACTURE SURGERY    . I&D EXTREMITY Left 09/03/2016   Procedure: IRRIGATION AND DEBRIDEMENT EXTREMITY - LEFT AKA;  Surgeon: Maeola Harman, MD;  Location: [facility] OR;  Service: Vascular;  Laterality: Left;  . PATCH ANGIOPLASTY Left 02/16/2016   Procedure: PATCH ANGIOPLASTY WITH Livia Snellen BIOLOGIC PATCH 1 CM X 6 CM;  Surgeon: Maeola Harman, MD;  Location: [facility] OR;  Service: Vascular;  Laterality: Left;  . PERIPHERAL VASCULAR BALLOON ANGIOPLASTY Right 08/02/2016   Procedure: Peripheral Vascular Balloon Angioplasty;  Surgeon: Maeola Harman, MD;  Location: [facility] INVASIVE CV LAB;  Service: Cardiovascular;  Laterality: Right;  peroneal artery  . PERIPHERAL VASCULAR CATHETERIZATION N/A 07/07/2015   Procedure: Abdominal Aortogram w/Lower Extremity;  Surgeon: Fransisco Hertz, MD;  Location: [facility] INVASIVE CV LAB;  Service: Cardiovascular;  Laterality: N/A;  . PERIPHERAL VASCULAR CATHETERIZATION N/A 02/15/2016   Procedure: Abdominal Aortogram w/Lower Extremity;  Surgeon: Maeola Harman, MD;  Location: [facility] INVASIVE CV LAB;  Service: Cardiovascular;  Laterality: N/A;  . PERIPHERAL VASCULAR CATHETERIZATION Bilateral 06/20/2016   Procedure: Lower Extremity Angiography;  Surgeon: Maeola Harman, MD;  Location: [facility] INVASIVE CV LAB;  Service: Cardiovascular;  Laterality: Bilateral;  limited runoff on rt leg thrombolysis lt leg bypass graft  . PERIPHERAL VASCULAR INTERVENTION Right 08/02/2016   Procedure: Peripheral Vascular Intervention;  Surgeon: Maeola Harman, MD;  Location: [facility] INVASIVE CV LAB;  Service: Cardiovascular;  Laterality: Right;  Femoral , popliteal  . 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    Social  History:  reports that he has quit smoking. He has a 120.00 pack-year smoking history. He has never used smokeless tobacco. He reports that he drank about 8.4 oz of alcohol per week. He reports that he does not use drugs.  Family History:  Family History  Problem Relation Age of Onset  . Cancer Mother        deceased in 49s   . Heart attack Mother        diseased  . Heart disease Father   . Stroke Sister        age 66s  . Heart disease Brother        pacemaker in his 76s     Prior to Admission medications   Medication Sig Start Date End Date Taking? Authorizing Provider  amLODipine (NORVASC) 10 MG tablet Take 1 tablet (10 mg total) by mouth daily. 07/07/16  Yes Rhyne, Ames Coupe, PA-C  aspirin EC 81 MG EC tablet Take 1 tablet (81 mg total) by mouth daily. 07/07/16  Yes Rhyne, Ames Coupe, PA-C  atorvastatin (LIPITOR) 40 MG tablet Take 1 tablet (40 mg total) by mouth daily at 6 PM. 02/18/16  Yes Kirby Crigler, Mir Mohammed, MD  cholecalciferol (VITAMIN D) 1000 units tablet Take 1,000 Units  by mouth daily.   Yes [provider]  ciprofloxacin (CIPRO) 500 MG tablet Take 500 mg by mouth 2 (two) times daily.   Yes [provider]  clindamycin (CLEOCIN) 150 MG capsule Take 150 mg by mouth 4 (four) times daily.   Yes [provider]  clindamycin (CLEOCIN) 300 MG capsule Take 300 mg by mouth 4 (four) times daily.   Yes [provider]  clopidogrel (PLAVIX) 75 MG tablet Take 1 tablet (75 mg total) by mouth daily. 07/07/16  Yes Rhyne, Samantha J, PA-C  docusate sodium (COLACE) 100 MG capsule Take 1 capsule (100 mg total) by mouth daily. 02/18/16  Yes Kirby Crigler, Mir Mohammed, MD  folic acid (FOLVITE) 1 MG tablet Take 1 tablet (1 mg total) by mouth daily. 07/07/16  Yes Rhyne, Samantha J, PA-C  Lidocaine (ASPERCREME LIDOCAINE) 4 % PTCH Apply 1 patch topically daily. Apply to left shoulder   Yes [provider]  metoprolol tartrate (LOPRESSOR) 25 MG tablet Take 1  tablet (25 mg total) by mouth 2 (two) times daily. Patient taking differently: Take 25 mg by mouth 2 (two) times daily. Hold for SBP<100, pulse <60 10/10/17  Yes Rhyne, Samantha J, PA-C  mirtazapine (REMERON) 15 MG tablet Take 15 mg by mouth at bedtime.   Yes [provider]  Multiple Vitamin (TAB-A-VITE PO) Take 1 tablet by mouth daily.   Yes [provider]  oxyCODONE-acetaminophen (PERCOCET/ROXICET) 5-325 MG tablet Take 1 tablet by mouth 4 (four) times daily. 10/10/17  Yes Rhyne, Samantha J, PA-C  pantoprazole (PROTONIX) 20 MG tablet Take 20 mg by mouth daily as needed for heartburn or indigestion.    Yes [provider]  polyethylene glycol (MIRALAX / GLYCOLAX) packet Take 17 g by mouth daily.   Yes [provider]  PROTEIN PO Take 30 mLs by mouth 3 (three) times daily.   Yes [provider]  senna-docusate (SENNA-PLUS) 8.6-50 MG tablet Take 2 tablets by mouth at bedtime.   Yes [provider]  spironolactone (ALDACTONE) 25 MG tablet Take 1 tablet (25 mg total) by mouth daily. 07/07/16  Yes Rhyne, Samantha J, PA-C  UNABLE TO FIND Take 60 mLs by mouth 3 (three) times daily. Med Name: Medpass 2.0   Yes [provider]  bisacodyl (DULCOLAX) 5 MG EC tablet Take 1 tablet (5 mg total) by mouth daily as needed for moderate constipation. Patient not taking: Reported on 10/01/2017 02/18/16   Colon Branch, MD  HYDROcodone-acetaminophen (NORCO/VICODIN) 5-325 MG tablet Take 1 tablet by mouth every 8 (eight) hours as needed for moderate pain. Patient not taking: Reported on 10/01/2017 12/06/16   Lars Mage, PA-C  lactulose (CHRONULAC) 10 GM/15ML solution Take 30 mLs (20 g total) by mouth 2 (two) times daily. Patient not taking: Reported on 10/01/2017 08/03/16   Rama, Maryruth Bun, MD    Physical Exam: Vitals:   11/09/17 0300 11/09/17 0315 11/09/17 0400 11/09/17 0530  BP:      Pulse: (!) 107 95 95 97  Resp: Temp:        TempSrc:      SpO2: 96% 97% 98% 98%  Weight:      Height:       General: Not in acute distress HEENT:       Eyes: PERRL, EOMI, no scleral icterus.       ENT: No discharge from the ears and nose, no pharynx injection, no tonsillar enlargement.  Neck: No JVD, no bruit, no mass felt. Heme: No neck lymph node enlargement. Cardiac: S1/S2, RRR, No murmurs, No gallops or rubs. Respiratory: No rales, wheezing, rhonchi or rubs. GI: Soft, nondistended, nontender, no rebound pain, no organomegaly, BS present. GU: No hematuria Ext: No pitting leg edema bilaterally. S/p of bilateral AKA. Musculoskeletal: No joint deformities, No joint redness or warmth, no limitation of ROM in spin. Skin: has wound dehiscence in the left leg stump, with some bleeding, but no pus. Has mild surrounding erythema. Neuro: confused, not oriented X3, cranial nerves II-XII grossly intact, moves all extremities. Neck is supple. Psych: Patient is not psychotic, no suicidal or hemocidal ideation.  Labs on Admission: I have personally reviewed following labs and imaging studies  CBC: Recent Labs  Lab 11/08/17 2245 11/09/17 0302  WBC 22.7* 23.5*  NEUTROABS 19.3*  --   HGB 10.6* 7.5*  HCT 33.1* 23.4*  MCV 94.3 95.1  PLT 719* 614*   Basic Metabolic Panel: Recent Labs  Lab 11/08/17 2245 11/09/17 0302  NA 133* 137  K 4.7 4.3  CL 95* 102  CO2 26 24  GLUCOSE 101* 109*  BUN 12 8  CREATININE 1.10 0.80  CALCIUM 9.3 8.1*   GFR: Estimated Creatinine Clearance: 52.5 mL/min (by C-G formula based on SCr of 0.8 mg/dL). Liver Function Tests: Recent Labs  Lab 11/08/17 2245  AST 20  ALT 25  ALKPHOS 92  BILITOT 0.8  PROT 7.1  ALBUMIN 2.7*   No results for input(s): LIPASE, AMYLASE in the last 168 hours. Recent Labs  Lab 11/09/17 0302  AMMONIA 22   Coagulation Profile: Recent Labs  Lab 11/09/17 0302  INR 1.15   Cardiac Enzymes: No results for input(s): CKTOTAL, CKMB, CKMBINDEX, TROPONINI in the  last 168 hours. BNP (last 3 results) No results for input(s): PROBNP in the last 8760 hours. HbA1C: No results for input(s): HGBA1C in the last 72 hours. CBG: No results for input(s): GLUCAP in the last 168 hours. Lipid Profile: No results for input(s): CHOL, HDL, LDLCALC, TRIG, CHOLHDL, LDLDIRECT in the last 72 hours. Thyroid Function Tests: No results for input(s): TSH, T4TOTAL, FREET4, T3FREE, THYROIDAB in the last 72 hours. Anemia Panel: No results for input(s): VITAMINB12, FOLATE, FERRITIN, TIBC, IRON, RETICCTPCT in the last 72 hours. Urine analysis:    Component Value Date/Time   COLORURINE YELLOW 11/08/2017 0031   APPEARANCEUR CLEAR 11/08/2017 0031   LABSPEC 1.016 11/08/2017 0031   PHURINE 5.0 11/08/2017 0031   GLUCOSEU NEGATIVE 11/08/2017 0031   HGBUR SMALL (A) 11/08/2017 0031   BILIRUBINUR NEGATIVE 11/08/2017 0031   KETONESUR 20 (A) 11/08/2017 0031   PROTEINUR NEGATIVE 11/08/2017 0031   UROBILINOGEN 0.2 11/13/2013 1634   NITRITE NEGATIVE 11/08/2017 0031   LEUKOCYTESUR NEGATIVE 11/08/2017 0031   Sepsis Labs: (procalcitonin:4,lacticidven:4) )No results found for this or any previous visit (from the past 240 hour(s)).   Radiological Exams on Admission: Dg Chest 1 View  Result Date: 11/08/2017 CLINICAL DATA:  Pain and fever EXAM: CHEST  1 VIEW COMPARISON:  10/18/2012 FINDINGS: The heart size and mediastinal contours are within normal limits. The lungs are mildly hyperinflated without acute pulmonary consolidation or CHF. Remote left-sided rib fractures. IMPRESSION: No active disease. Electronically Signed   By: Tollie Eth M.D.   On: 11/08/2017 23:28   Ct Head Wo Contrast  Result Date: 11/09/2017 CLINICAL DATA:  Acute onset of altered level of consciousness. EXAM: CT HEAD WITHOUT CONTRAST TECHNIQUE: Contiguous axial images were obtained from the base of  the skull through the vertex without intravenous contrast. COMPARISON:  CT of the head performed 07/31/2016  FINDINGS: Brain: No evidence of acute infarction, hemorrhage, hydrocephalus, extra-axial collection or mass lesion / mass effect. Prominence of the ventricles and sulci reflects moderate cortical volume loss. Mild cerebellar atrophy is noted. A chronic infarct is noted at the left occipital lobe, reflecting remote infarct. The brainstem and fourth ventricle are within normal limits. The basal ganglia are unremarkable in appearance. The cerebral hemispheres demonstrate grossly normal gray-white differentiation. No mass effect or midline shift is seen. Vascular: No hyperdense vessel or unexpected calcification. Skull: There is no evidence of fracture; visualized osseous structures are unremarkable in appearance. Sinuses/Orbits: The orbits are within normal limits. There is mild opacification of the left maxillary sinus. The remaining paranasal sinuses and mastoid air cells are well-aerated. Other: No significant soft tissue abnormalities are seen. IMPRESSION: 1. No acute intracranial pathology seen on CT. 2. Moderate cortical volume loss noted. 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 3. Mild opacification of the left maxillary sinus. Electronically Signed   By: Roanna Raider M.D.   On: 11/09/2017 02:58     EKG: Independently reviewed.  Sinus rhythm, tachycardia, QTC 435, no ischemic change.   Assessment/Plan Principal Problem:   Sepsis (HCC) Active Problems:   Essential hypertension   Stroke (HCC)   HLD (hyperlipidemia)   GERD (gastroesophageal reflux disease)   Coronary atherosclerosis of native coronary artery   Acute metabolic encephalopathy   Leg wound, left   Sepsis [facility]): Patient is septic with leukocytosis, fever, tachycardia. lactic acid is normal.  Hemodynamically stable.  Etiology is not clear.  Source of infection is not clear.  Urinalysis negative and chest x-ray negative.  No skin infection noted. Patient has wound dehiscence and some bleeding in left  leg stump but no obvious pus draining, patient may have deeper wound infection but is not very sure,  Vascular surgeon, Dr. Arbie Cookey was consulted by EDP.  -Will admit to tele bed as inpt -Vancomycin and Zosyn was started in the ED, will continue -f/u Bx and Ux -will get Procalcitonin and trend lactic acid levels per sepsis protocol. -IVF: 2L of NS bolus in ED  Essential hypertension: -Hold amlodipine and spironolactone since patient is at risk of developing hypotension. -Continue metoprolol -IV hydralazine as needed  Hx Stroke [facility]): -Aspirin, Lipitor Hold Plavix due to bleeding from the wound  HLD: -Lipitor  GERD: -Protonix  Coronary atherosclerosis of native coronary artery: No chest pain -Continue aspirin, Lipitor and metoprolol  Acute metabolic encephalopathy: Etiology is not clear. 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 Pt had history of elevated ammonia level in the past.  Liver function normal today. -Frequent neuro check -Check ammonia level -Continue lactulose  Leg wound, left: Patient has wound dehiscence and some bleeding in left leg stump.  Vascular surgeon, Dr. Betti Cruz was consulted. -Wound care consult -f/u VVS recommendations  DVT ppx: none (patient has leg wound bleeding, cannot use heparin or Lovenox.  Patient is s/p of bilateral AKA, cannot use SCD). Code Status: Full code Family Communication: None at bed side.  Disposition Plan:  Anticipate discharge back to SNF Consults called:  none Admission status:  Inpatient/tele      Date of Service 11/09/2017    Lorretta Harp [facility] Pager [phone]  If 7PM-7AM, please contact night-coverage [email or web address] Password [facility] 11/09/2017, 5:55 AM

## 2017-11-09 NOTE — Progress Notes (Signed)
New Admission Note:   Arrival Method: stretcher Mental Orientation: alert and oriented Telemetry: box 7 Assessment: completed Skin: assessed by 2 RNs IV:  R antecubital NSL Pain: Denies Tubes: n/a Safety Measures: bed alarm, high risk protocol Admission: completed 55M Orientation: completed Family: No one at bedside. Patient verified his brother, Dorene Sorrow, is point of contact Personal Belongings: clothing  Orders have been reviewed and implemented. Will continue to monitor the patient. Call light has been placed within reach and bed alarm has been activated.   Hortencia Conradi, RN MSN CNE [id number] Phone number: [phone]

## 2017-11-09 NOTE — ED Notes (Signed)
Delay in lab draw,  Pt not in room 

## 2017-11-09 NOTE — ED Notes (Addendum)
This RN performed a full linen change as well as changed this pts dressing.  Wound is actively draining a small amount of blood.  No odor present.

## 2017-11-09 NOTE — Progress Notes (Signed)
Patient ID: Stephen Bowen, male   DOB: September 18, 1955, 62 y.o.   MRN: [id number] Patient status post revision of his left above-knee amputation by Dr. Randie Heinz on 10/08/2017.  He had had bone exposed from a prior above-knee amputation.  He also has a right above-knee amputation.  He was seen in our office yesterday for staple removal.  At that time he was found to have complete healing of the entire amputation excite except there was some eschar and wound separation on the lateral most aspect of the left above-knee amputation incision.  This was debrided and packed in the office.  He presented to the emergency room last night with a 103 fever.  His right amputation is healed.  On the left he does have a 3 cm area over the lateral aspect that is open.  There is some tenderness on probing this but no evidence of collection of pus.  The remaining portion of the amputation site has no erythema and there is no tenderness over his femur.  No obvious infection in his above-knee amputation.  Obvious concern given his fever.  Agree with admission for antibiotics and search for other source for fever.  Will continue to follow.

## 2017-11-09 NOTE — ED Notes (Signed)
Saline gauze and Kurlex dressing applied to L AKA.

## 2017-11-09 NOTE — Progress Notes (Signed)
Patient ID: HERSON PRICHARD, male   DOB: 1956-02-09, 62 y.o.   MRN: [id number]                                                                PROGRESS NOTE                                                                                                                                                                                                             Patient Demographics:    Stephen Bowen, is a 62 y.o. male, DOB - 1956/05/16, OXB:[id number]  Admit date - 11/08/2017   Admitting Physician Ivor Costa, MD  Outpatient Primary MD for the patient is [name], MD  LOS - 0  Outpatient Specialists:     Chief Complaint  Patient presents with  . Post-op Problem       Brief Narrative     62 y.o. male with medical history significant of hypertension, hyperlipidemia, GERD, depression, CAD, PVD, s/p of bilateral AKA, dCHF, who  presents with surgical wound problem and AMS.  Patient has AMS, and is a poor historian and is unable to provide accurate medical history, therefore, most of the history is obtained by discussing the case with ED physician, per EMS report, and with the nursing staff.   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 Per EDP, "he was seen and evaluated by Dr. [name] Resides vascular surgery today and stitches were removed. He did have some opening of his wound noted which was packed in the office". Today he obviously developed fever with temperature 103 in ED. When I saw pt in ED, he is confused, not oriented x 3. He has wound dehiscence in the left leg stump, with some bleeding, but no pus.  Patient answers some questions, not sure if his answer is reliable or not.  He denied chest pain, shortness breath, abdominal pain.  No active cough, nausea vomiting or diarrhea noted.  He moves all extremities.  ED Course: pt was found to have WBC 22.7, lactic acid of 1.37, creatinine 1.10, negative urinalysis, temperature 103, tachycardia, no tachypnea,  oxygen saturation 97% on room air, negative chest x-ray, blood pressure normal.  Patient is admitted to telemetry bed as inpatient.  # CT-head is negative  for acute intracranial abnormalities, but showed chronic infarct at the left occipital lobe, reflecting remote infarct.    Subjective:    Stephen Bowen today has been afebrile.  Pt denies cough, cp, palp, sob, n/v, diarrhea, brbpr, black stool.  Pt seen by vascular surgery who doesn't think that imaging of the wound on the left BKA is necessary at this time.   No headache,  No abdominal pain - No new weakness tingling or numbness   Assessment  & Plan :    Principal Problem:   Sepsis ([place]) Active Problems:   Essential hypertension   Stroke ([place])   HLD (hyperlipidemia)   GERD (gastroesophageal reflux disease)   Coronary atherosclerosis of native coronary artery   Acute metabolic encephalopathy   Leg wound, left     Sepsis [facility]): Patient is septic with leukocytosis, fever, tachycardia. lactic acid is normal.  Hemodynamically stable.  Etiology is not clear.  Source of infection is not clear.  Urinalysis negative and chest x-ray negative.  No skin infection noted. Patient has wound dehiscence and some bleeding in left leg stump but no obvious pus draining, patient may have deeper wound infection but is not very sure,   Vascular surgeon, Dr. [name] was consulted by EDP appreciat input Procalcitonin negative Blood culture x2 pending Cont Vancomycin and Zosyn was started in the ED  Tachycardia Tele Trop I q6h x3 Check cardiac echo, (Fever, ESR 134)  Essential hypertension: Hold amlodipine and spironolactone since patient is at risk of developing hypotension. Continue metoprolol IV hydralazine prn  Hx Stroke [facility]): Aspirin, Lipitor Hold Plavix due to bleeding from the wound  GERD: Cont Protonix  Anemia Likely due to blood loss from stump If Hgb <7.5 in the am, then please order transfusion Check myeloma panel,  ferritin, iron, tibc, b12, folate in am  Coronary atherosclerosis of native coronary artery: No chest pain Continue aspirin, Lipitor and metoprolol  Acute metabolic encephalopathy: Etiology is not clear. 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 Pt had history of elevated ammonia level in the past.  Liver function normal today. Frequent neuro check Continue lactulose  Leg wound, left: Patient has wound dehiscence and some bleeding in left leg stump.  Vascular surgeon, Dr. [name] was consulted. Wound care consult f/u [facility] recommendations  DVT ppx: none (patient has leg wound bleeding, cannot use heparin or Lovenox.  Patient is s/p of bilateral AKA, cannot use SCD). Code Status: Full code Family Communication: None at bed side.  Disposition Plan:  Anticipate discharge back to SNF Consults called:  none Admission status:  Inpatient/tele        Lab Results  Component Value Date   PLT 614 (H) 11/09/2017    Antibiotics  :  Vanco, zosy 5/17=>  Anti-infectives (From admission, onward)   Start     Dose/Rate Route Frequency Ordered Stop   11/09/17 2200  vancomycin (VANCOCIN) IVPB 750 mg/150 ml premix     750 mg 150 mL/hr over 60 Minutes Intravenous Every 24 hours 11/09/17 0155     11/09/17 0800  piperacillin-tazobactam (ZOSYN) IVPB 3.375 g     3.375 g 12.5 mL/hr over 240 Minutes Intravenous Every 8 hours 11/09/17 0155     11/08/17 2300  vancomycin (VANCOCIN) IVPB 750 mg/150 ml premix     750 mg 150 mL/hr over 60 Minutes Intravenous  Once 11/08/17 2251 11/09/17 0058   11/08/17 2245  piperacillin-tazobactam (ZOSYN) IVPB 3.375 g  3.375 g 100 mL/hr over 30 Minutes Intravenous  Once 11/08/17 2244 11/09/17 0058   11/08/17 2245  vancomycin (VANCOCIN) IVPB 1000 mg/200 mL premix  Status:  Discontinued     1,000 mg 200 mL/hr over 60 Minutes Intravenous  Once 11/08/17 2244 11/08/17 2251          Objective:   Vitals:   11/09/17 0400 11/09/17 0530 11/09/17 0630 11/09/17 0645  BP:   96/66 96/69  Pulse: 95 97 (!) 105 (!) 106  Resp: 20 18 (!) 21 19  Temp:      TempSrc:      SpO2: 98% 98% 99% 97%  Weight:      Height:        Wt Readings from Last 3 Encounters:  11/08/17 38.3 kg (84 lb 7 oz)  10/08/17 38.3 kg (84 lb 7 oz)  09/13/17 38.3 kg (84 lb 8 oz)     Intake/Output Summary (Last 24 hours) at 11/09/2017 0851 Last data filed at 11/09/2017 0058 Gross per 24 hour  Intake 2250 ml  Output -  Net 2250 ml     Physical Exam  Awake Alert, Oriented X 3, No new F.N deficits, Normal affect [place].AT,PERRAL Supple Neck,No JVD, No cervical lymphadenopathy appriciated.  Symmetrical Chest wall movement, Good air movement bilaterally, CTAB Tachy s1, s2,  +ve B.Sounds, Abd Soft, No tenderness, No organomegaly appriciated, No rebound - guarding or rigidity. No Cyanosis, Clubbing or edema, No new Rash or bruise  Bilateral BKA Bleeding from open area about 1x2cm oval on the lateral aspect of tip of left stump, no puss    Data Review:    CBC Recent Labs  Lab 11/08/17 2245 11/09/17 0302  WBC 22.7* 23.5*  HGB 10.6* 7.5*  HCT 33.1* 23.4*  PLT 719* 614*  MCV 94.3 95.1  MCH 30.2 30.5  MCHC 32.0 32.1  RDW 13.8 13.7  LYMPHSABS 1.2  --   MONOABS 1.4*  --   EOSABS 0.3  --   BASOSABS 0.0  --     Chemistries  Recent Labs  Lab 11/08/17 2245 11/09/17 0302  NA 133* 137  K 4.7 4.3  CL 95* 102  CO2 26 24  GLUCOSE 101* 109*  BUN 12 8  CREATININE 1.10 0.80  CALCIUM 9.3 8.1*  AST 20  --   ALT 25  --   ALKPHOS 92  --   BILITOT 0.8  --    ------------------------------------------------------------------------------------------------------------------ No results for input(s): CHOL, HDL, LDLCALC, TRIG, CHOLHDL, LDLDIRECT in the last 72 hours.  Lab Results  Component Value Date   HGBA1C 5.5 02/14/2016    ------------------------------------------------------------------------------------------------------------------ No results for input(s): TSH, T4TOTAL, T3FREE, THYROIDAB in the last 72 hours.  Invalid input(s): FREET3 ------------------------------------------------------------------------------------------------------------------ No results for input(s): VITAMINB12, FOLATE, FERRITIN, TIBC, IRON, RETICCTPCT in the last 72 hours.  Coagulation profile Recent Labs  Lab 11/09/17 0302  INR 1.15    No results for input(s): DDIMER in the last 72 hours.  Cardiac Enzymes No results for input(s): CKMB, TROPONINI, MYOGLOBIN in the last 168 hours.  Invalid input(s): CK ------------------------------------------------------------------------------------------------------------------    Component Value Date/Time   BNP 48.8 11/09/2017 0302    Inpatient Medications  Scheduled Meds: . aspirin EC  81 mg Oral Daily  . atorvastatin  40 mg Oral q1800  . cholecalciferol  1,000 Units Oral Daily  . docusate sodium  100 mg Oral Daily  . folic acid  1 mg Oral Daily  . lidocaine  1 patch Transdermal Daily  .  metoprolol tartrate  25 mg Oral BID  . mirtazapine  15 mg Oral QHS  . multivitamin with minerals  1 tablet Oral Daily  . oxyCODONE-acetaminophen  1 tablet Oral QID   Continuous Infusions: . piperacillin-tazobactam (ZOSYN)  IV    . vancomycin     PRN Meds:.acetaminophen, hydrALAZINE, ondansetron (ZOFRAN) IV, pantoprazole, polyethylene glycol, zolpidem  Micro Results No results found for this or any previous visit (from the past 240 hour(s)).  Radiology Reports Dg Chest 1 View  Result Date: 11/08/2017 CLINICAL DATA:  Pain and fever EXAM: CHEST  1 VIEW COMPARISON:  10/18/2012 FINDINGS: The heart size and mediastinal contours are within normal limits. The lungs are mildly hyperinflated without acute pulmonary consolidation or CHF. Remote left-sided rib fractures. IMPRESSION: No active  disease. Electronically Signed   By: Ashley Royalty M.D.   On: 11/08/2017 23:28   Ct Head Wo Contrast  Result Date: 11/09/2017 CLINICAL DATA:  Acute onset of altered level of consciousness. EXAM: CT HEAD WITHOUT CONTRAST TECHNIQUE: Contiguous axial images were obtained from the base of the skull through the vertex without intravenous contrast. COMPARISON:  CT of the head performed 07/31/2016 FINDINGS: Brain: No evidence of acute infarction, hemorrhage, hydrocephalus, extra-axial collection or mass lesion / mass effect. Prominence of the ventricles and sulci reflects moderate cortical volume loss. Mild cerebellar atrophy is noted. A chronic infarct is noted at the left occipital lobe, reflecting remote infarct. The brainstem and fourth ventricle are within normal limits. The basal ganglia are unremarkable in appearance. The cerebral hemispheres demonstrate grossly normal gray-white differentiation. No mass effect or midline shift is seen. Vascular: No hyperdense vessel or unexpected calcification. Skull: There is no evidence of fracture; visualized osseous structures are unremarkable in appearance. Sinuses/Orbits: The orbits are within normal limits. There is mild opacification of the left maxillary sinus. The remaining paranasal sinuses and mastoid air cells are well-aerated. Other: No significant soft tissue abnormalities are seen. IMPRESSION: 1. No acute intracranial pathology seen on CT. 2. Moderate cortical volume loss noted. 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 3. Mild opacification of the left maxillary sinus. Electronically Signed   By: Garald Balding M.D.   On: 11/09/2017 02:58    Time Spent in minutes  30   Jani Gravel M.D on 11/09/2017 at 8:51 AM  Between 7am to 7pm - Pager - [phone]  After 7pm go to [email or web address] - password [facility]  [facility] -  Office  [phone]

## 2017-11-09 NOTE — ED Notes (Signed)
Patient transported to CT 

## 2017-11-10 ENCOUNTER — Inpatient Hospital Stay (HOSPITAL_COMMUNITY): Payer: Medicaid Other

## 2017-11-10 DIAGNOSIS — I2510 Atherosclerotic heart disease of native coronary artery without angina pectoris: Secondary | ICD-10-CM

## 2017-11-10 LAB — COMPREHENSIVE METABOLIC PANEL
ALBUMIN: 2.3 g/dL — AB (ref 3.5–5.0)
ALK PHOS: 79 U/L (ref 38–126)
ALT: 18 U/L (ref 17–63)
ANION GAP: 14 (ref 5–15)
AST: 13 U/L — ABNORMAL LOW (ref 15–41)
BILIRUBIN TOTAL: 0.7 mg/dL (ref 0.3–1.2)
BUN: 14 mg/dL (ref 6–20)
CALCIUM: 8.4 mg/dL — AB (ref 8.9–10.3)
CO2: 24 mmol/L (ref 22–32)
Chloride: 99 mmol/L — ABNORMAL LOW (ref 101–111)
Creatinine, Ser: 0.87 mg/dL (ref 0.61–1.24)
GFR calc Af Amer: >60 mL/min (ref >60)
GFR calc non Af Amer: >60 mL/min (ref >60)
GLUCOSE: 89 mg/dL (ref 65–99)
Potassium: 4 mmol/L (ref 3.5–5.1)
Sodium: 137 mmol/L (ref 135–145)
TOTAL PROTEIN: 6.2 g/dL — AB (ref 6.5–8.1)

## 2017-11-10 LAB — IRON AND TIBC
Iron: 24 ug/dL — ABNORMAL LOW (ref 45–182)
Saturation Ratios: 18 % (ref 17.9–39.5)
TIBC: 132 ug/dL — AB (ref 250–450)
UIBC: 108 ug/dL

## 2017-11-10 LAB — URINE CULTURE: CULTURE: NO GROWTH

## 2017-11-10 LAB — TROPONIN I

## 2017-11-10 LAB — CBC
HEMATOCRIT: 23 % — AB (ref 39.0–52.0)
Hemoglobin: 7.2 g/dL — ABNORMAL LOW (ref 13.0–17.0)
MCH: 30.3 pg (ref 26.0–34.0)
MCHC: 31.3 g/dL (ref 30.0–36.0)
MCV: 96.6 fL (ref 78.0–100.0)
Platelets: 666 10*3/uL — ABNORMAL HIGH (ref 150–400)
RBC: 2.38 MIL/uL — ABNORMAL LOW (ref 4.22–5.81)
RDW: 13.9 % (ref 11.5–15.5)
WBC: 19.1 10*3/uL — ABNORMAL HIGH (ref 4.0–10.5)

## 2017-11-10 LAB — ECHOCARDIOGRAM COMPLETE
Height: 66 in
Weight: 1647.28 oz

## 2017-11-10 LAB — GLUCOSE, CAPILLARY: GLUCOSE-CAPILLARY: 98 mg/dL (ref 65–99)

## 2017-11-10 LAB — PREPARE RBC (CROSSMATCH)

## 2017-11-10 LAB — FERRITIN: FERRITIN: 252 ng/mL (ref 24–336)

## 2017-11-10 LAB — VITAMIN B12: Vitamin B-12: 1193 pg/mL — ABNORMAL HIGH (ref 180–914)

## 2017-11-10 MED ORDER — SODIUM CHLORIDE 0.9 % IV SOLN
Freq: Once | INTRAVENOUS | Status: AC
  Administered 2017-11-10: 17:00:00 via INTRAVENOUS

## 2017-11-10 MED ORDER — ACETAMINOPHEN 325 MG PO TABS
650.0000 mg | ORAL_TABLET | Freq: Once | ORAL | Status: AC
  Administered 2017-11-10: 650 mg via ORAL
  Filled 2017-11-10: qty 2

## 2017-11-10 MED ORDER — DIPHENHYDRAMINE HCL 50 MG/ML IJ SOLN
25.0000 mg | Freq: Once | INTRAMUSCULAR | Status: AC
  Administered 2017-11-10: 25 mg via INTRAVENOUS
  Filled 2017-11-10: qty 1

## 2017-11-10 NOTE — Consult Note (Signed)
WOC Nurse wound consult note Reason for Consult: Left stump incision dehiscence POA: Yes Measurement: Patient refused a complete evaluation.  Stated his doctor just told him it looks better. Wound bed: Unable to visualize secondary to patient refusal Drainage (amount, consistency, odor) Patient agreed to allow me to lift the edge of the dressing to at least visualize the existing dressing.  It had serosanginous drainage on the gauze packing. Periwound: Intact. Dressing procedure/placement/frequency: Twice daily saline packing order is already in the computer.  Please follow these orders provided by Dr. Arbie Cookey. Monitor the wound area(s) for worsening of condition such as: Signs/symptoms of infection,  Increase in size,  Development of or worsening of odor, Development of pain, or increased pain at the affected locations.  Notify the medical team if any of these develop.  Thank you for the consult.  Discussed plan of care with the patient and bedside nurse.  WOC nurse will not follow at this time.  Please re-consult the WOC team if needed.  Helmut Muster, RN, MSN, CWOCN, CNS-BC, pager [phone]

## 2017-11-10 NOTE — Progress Notes (Signed)
Patient refusing dressing change at this time. Education provided. Will continue to encourage and monitor.

## 2017-11-10 NOTE — Progress Notes (Signed)
Patient ID: Stephen Bowen, male   DOB: 1956/03/01, 62 y.o.   MRN: [id number] Comfortable.  Has been afebrile for over 24 hours.  On Zosyn No tenderness in his left above-knee amputation.  The lateral area shows no evidence of surrounding erythema Stable overall.  Continue antibiotics.  Question source of infection.  We will follow with you.

## 2017-11-10 NOTE — Progress Notes (Addendum)
Patient ID: Stephen Bowen, male   DOB: 1955/10/10, 62 y.o.   MRN: [id number]                                                                PROGRESS NOTE                                                                                                                                                                                                             Patient Demographics:    Stephen Bowen, is a 62 y.o. male, DOB - 1956-05-13, CBJ:[id number]  Admit date - 11/08/2017   Admitting Physician Ivor Costa, MD  Outpatient Primary MD for the patient is [name], MD  LOS - 1  Outpatient Specialists:     Chief Complaint  Patient presents with  . Post-op Problem       Brief Narrative   62 y.o.malewith medical history significant ofhypertension, hyperlipidemia, GERD, depression, CAD, PVD,s/p ofbilateral AKA, dCHF, whopresents with surgical wound problem and AMS.  Patient has AMS,and is apoor historian and is unable to provide accurate medical history, therefore, most of the history is obtained by discussing the case with ED physician, per EMS report, and with the nursing staff.   It seems thatpatient had a revision of his left AKA done on October 10, 2017.Per EDP,"he was seen and evaluated by Dr. [name] Resides vascular surgery today and stitches were removed. He did have some opening of his wound noted which was packed in the office".Todayhe obviously developed feverwithtemperature 103 in ED. When I saw pt in ED, he is confused, not oriented x 3. He haswound dehiscence in the left leg stump, with some bleeding, but no pus.Patient answers some questions, not sure if hisanswerisreliable or not. He denied chest pain, shortness breath, abdominal pain. No active cough, nausea vomiting or diarrhea noted. He moves all extremities.  ED Course:pt was found to haveWBC 22.7, lactic acid of 1.37, creatinine 1.10, negative urinalysis, temperature 103, tachycardia, no tachypnea,  oxygen saturation 97% on room air, negative chest x-ray, blood pressure normal. Patient is admitted to telemetry bed as inpatient.  # CT-headis negative for acute intracranial abnormalities, but showedchronic infarct at the left occipital lobe, reflecting remote infarct.       Subjective:    Dave Mergen today has been afebrile  overnite.  Pt resting comfortable.  Source of leukocytosis unclear.   ESR was high on admission.  Appears to be improving with IV abx.   No headache, No chest pain, No abdominal pain - No Nausea, No new weakness tingling or numbness, No Cough - SOB.    Assessment  & Plan :    Principal Problem:   Sepsis ([place]) Active Problems:   Essential hypertension   Stroke ([place])   HLD (hyperlipidemia)   GERD (gastroesophageal reflux disease)   Coronary atherosclerosis of native coronary artery   Acute metabolic encephalopathy   Leg wound, left   Anemia   Tachycardia     Sepsis (HCC):Patient is septic with leukocytosis, fever, tachycardia.lactic acid is normal. Hemodynamically stable. Etiology is not clear. Source of infection is not clear. Urinalysis negative and chest x-ray negative. No skin infection noted. Patient has wound dehiscence and some bleeding in left leg stump but no obvious pus draining,patient may have deeper wound infection but is not very sure, Vascular surgeon, Dr. [name] was consulted by EDP appreciat input Procalcitonin negative Blood culture x2  (5/17) ngtd Wound culture (5/18) negative Cont Vancomycin and Zosyn was started in the ED Cont abx   Tachycardia Tele Trop I q6h x3 Check cardiac echo, (Fever, ESR 134), no vegetation mentioned. EF 65-70%,   Essential hypertension: Hold amlodipine and spironolactone since patient is at risk of developing hypotension. Continue metoprolol IV hydralazine prn  HxStroke ([name]): Aspirin, Lipitor Hold Plavix due to bleeding from the wound  GERD: Cont  Protonix  Anemia Likely due to blood loss from stump Transfuse with 2 units prbc  Myeloma panel, pending Cbc in am  Coronary atherosclerosis of native coronary artery: No chest pain Continue aspirin, Lipitor and metoprolol  Acute metabolic encephalopathy:Etiology is not clear.CT-headis negative for acute intracranial abnormalities, but showedchronic infarct at the left occipital lobe, reflecting remote infarct.Pt hadhistory of elevated ammonia level in the past.Liver function normal today. Frequent neuro check Continue lactulose Appears to be improving  Leg wound, left:Patient has wound dehiscence and some bleeding in left leg stump. Vascular surgeon, Dr. [name] was consulted. Wound care consult f/u VVSrecommendations  DVT WTU:UEKC (patient haslegwound bleeding, cannot use heparin or Lovenox. Patient is s/p ofbilateral AKA,cannot use SCD). Code Status:Full code Family Communication: None at bed side.  Disposition Plan: Anticipate discharge back to SNF Consults called:none Admission status: Inpatient/tele   Antibiotics  :  Vanco, zosy 5/17=>       Lab Results  Component Value Date   PLT 666 (H) 11/10/2017      Anti-infectives (From admission, onward)   Start     Dose/Rate Route Frequency Ordered Stop   11/09/17 2200  vancomycin (VANCOCIN) IVPB 750 mg/150 ml premix     750 mg 150 mL/hr over 60 Minutes Intravenous Every 24 hours 11/09/17 0155     11/09/17 0800  piperacillin-tazobactam (ZOSYN) IVPB 3.375 g     3.375 g 12.5 mL/hr over 240 Minutes Intravenous Every 8 hours 11/09/17 0155     11/08/17 2300  vancomycin (VANCOCIN) IVPB 750 mg/150 ml premix     750 mg 150 mL/hr over 60 Minutes Intravenous  Once 11/08/17 2251 11/09/17 0058   11/08/17 2245  piperacillin-tazobactam (ZOSYN) IVPB 3.375 g     3.375 g 100 mL/hr over 30 Minutes Intravenous  Once 11/08/17 2244 11/09/17 0058   11/08/17 2245  vancomycin (VANCOCIN) IVPB 1000 mg/200 mL  premix  Status:  Discontinued     1,000 mg 200 mL/hr over 60  Minutes Intravenous  Once 11/08/17 2244 11/08/17 2251        Objective:   Vitals:   11/09/17 1600 11/09/17 1822 11/09/17 2042 11/10/17 0943  BP: 99/69 109/73 101/72 112/81  Pulse: 94 92 (!) 109 (!) 105  Resp: '18 18 16 17  ' Temp:  98.2 F (36.8 C) 99 F (37.2 C) 98.8 F (37.1 C)  TempSrc:  Oral Oral Oral  SpO2: 96% 96% 99% 97%  Weight:   46.7 kg (102 lb 15.3 oz)   Height:        Wt Readings from Last 3 Encounters:  11/09/17 46.7 kg (102 lb 15.3 oz)  10/08/17 38.3 kg (84 lb 7 oz)  09/13/17 38.3 kg (84 lb 8 oz)     Intake/Output Summary (Last 24 hours) at 11/10/2017 1025 Last data filed at 11/10/2017 0945 Gross per 24 hour  Intake 290 ml  Output 300 ml  Net -10 ml     Physical Exam  Awake Alert, Oriented X 3, No new F.N deficits, Normal affect [place].AT,PERRAL Supple Neck,No JVD, No cervical lymphadenopathy appriciated.  Symmetrical Chest wall movement, Good air movement bilaterally, CTAB RRR,No Gallops,Rubs or new Murmurs, No Parasternal Heave +ve B.Sounds, Abd Soft, No tenderness, No organomegaly appriciated, No rebound - guarding or rigidity. No Cyanosis, Clubbing or edema, No new Rash or bruise   Bilateral BKA Trace bleeding from open area about 1x2cm oval on the lateral aspect of tip of left stump, no puss       Data Review:    CBC Recent Labs  Lab 11/08/17 2245 11/09/17 0302 11/10/17 0443  WBC 22.7* 23.5* 19.1*  HGB 10.6* 7.5* 7.2*  HCT 33.1* 23.4* 23.0*  PLT 719* 614* 666*  MCV 94.3 95.1 96.6  MCH 30.2 30.5 30.3  MCHC 32.0 32.1 31.3  RDW 13.8 13.7 13.9  LYMPHSABS 1.2  --   --   MONOABS 1.4*  --   --   EOSABS 0.3  --   --   BASOSABS 0.0  --   --     Chemistries  Recent Labs  Lab 11/08/17 2245 11/09/17 0302 11/10/17 0443  NA 133* 137 137  K 4.7 4.3 4.0  CL 95* 102 99*  CO2 '26 24 24  ' GLUCOSE 101* 109* 89  BUN '12 8 14  ' CREATININE 1.10 0.80 0.87  CALCIUM 9.3 8.1* 8.4*  AST  20  --  13*  ALT 25  --  18  ALKPHOS 92  --  79  BILITOT 0.8  --  0.7   ------------------------------------------------------------------------------------------------------------------ No results for input(s): CHOL, HDL, LDLCALC, TRIG, CHOLHDL, LDLDIRECT in the last 72 hours.  Lab Results  Component Value Date   HGBA1C 5.5 02/14/2016   ------------------------------------------------------------------------------------------------------------------ No results for input(s): TSH, T4TOTAL, T3FREE, THYROIDAB in the last 72 hours.  Invalid input(s): FREET3 ------------------------------------------------------------------------------------------------------------------ Recent Labs    11/10/17 0443  VITAMINB12 1,193*  FERRITIN 252  TIBC 132*  IRON 24*    Coagulation profile Recent Labs  Lab 11/09/17 0302  INR 1.15    No results for input(s): DDIMER in the last 72 hours.  Cardiac Enzymes Recent Labs  Lab 11/09/17 0946 11/09/17 1726 11/10/17 0029  TROPONINI <0.03 <0.03 <0.03   ------------------------------------------------------------------------------------------------------------------    Component Value Date/Time   BNP 48.8 11/09/2017 0302    Inpatient Medications  Scheduled Meds: . aspirin EC  81 mg Oral Daily  . atorvastatin  40 mg Oral q1800  . cholecalciferol  1,000 Units Oral Daily  . docusate sodium  100 mg Oral Daily  . feeding supplement (PRO-STAT SUGAR FREE 64)  30 mL Oral BID  . folic acid  1 mg Oral Daily  . lidocaine  1 patch Transdermal Daily  . metoprolol tartrate  25 mg Oral BID  . mirtazapine  15 mg Oral QHS  . multivitamin with minerals  1 tablet Oral Daily   Continuous Infusions: . piperacillin-tazobactam (ZOSYN)  IV Stopped (11/10/17 0941)  . vancomycin Stopped (11/10/17 0152)   PRN Meds:.acetaminophen, hydrALAZINE, ondansetron (ZOFRAN) IV, oxyCODONE-acetaminophen, pantoprazole, polyethylene glycol, zolpidem  Micro Results Recent  Results (from the past 240 hour(s))  Blood Culture (routine x 2)     Status: None (Preliminary result)   Collection Time: 11/08/17 10:45 PM  Result Value Ref Range Status   Specimen Description BLOOD RIGHT ANTECUBITAL  Final   Special Requests   Final    BOTTLES DRAWN AEROBIC AND ANAEROBIC Blood Culture adequate volume   Culture   Final    NO GROWTH < 12 HOURS Performed at [facility], [place] [address] [place], [place] [address]    Report Status PENDING  Incomplete  Blood Culture (routine x 2)     Status: None (Preliminary result)   Collection Time: 11/08/17 11:00 PM  Result Value Ref Range Status   Specimen Description BLOOD RIGHT HAND  Final   Special Requests   Final    BOTTLES DRAWN AEROBIC ONLY Blood Culture results may not be optimal due to an inadequate volume of blood received in culture bottles   Culture   Final    NO GROWTH < 12 HOURS Performed at [facility], [place] [address], [place], [place] [address]    Report Status PENDING  Incomplete  Aerobic Culture (superficial specimen)     Status: None (Preliminary result)   Collection Time: 11/09/17 10:42 AM  Result Value Ref Range Status   Specimen Description WOUND LEFT LEG  Final   Special Requests NONE  Final   Gram Stain   Final    RARE WBC PRESENT,BOTH PMN AND MONONUCLEAR NO ORGANISMS SEEN Performed at [facility], [place] [address], [place], [place] [address]    Culture PENDING  Incomplete   Report Status PENDING  Incomplete  MRSA PCR Screening     Status: None   Collection Time: 11/09/17  5:52 PM  Result Value Ref Range Status   MRSA by PCR NEGATIVE NEGATIVE Final    Comment:        The GeneXpert MRSA Assay (FDA approved for NASAL specimens only), is one component of a comprehensive MRSA colonization surveillance program. It is not intended to diagnose MRSA infection nor to guide or monitor treatment for MRSA infections. Performed at [facility], [place] [address], [place], [place]  [address]     Radiology Reports Dg Chest 1 View  Result Date: 11/08/2017 CLINICAL DATA:  Pain and fever EXAM: CHEST  1 VIEW COMPARISON:  10/18/2012 FINDINGS: The heart size and mediastinal contours are within normal limits. The lungs are mildly hyperinflated without acute pulmonary consolidation or CHF. Remote left-sided rib fractures. IMPRESSION: No active disease. Electronically Signed   By: Ashley Royalty M.D.   On: 11/08/2017 23:28   Ct Head Wo Contrast  Result Date: 11/09/2017 CLINICAL DATA:  Acute onset of altered level of consciousness. EXAM: CT HEAD WITHOUT CONTRAST TECHNIQUE: Contiguous axial images were obtained from the base of the skull through the vertex without intravenous contrast. COMPARISON:  CT of the head performed  07/31/2016 FINDINGS: Brain: No evidence of acute infarction, hemorrhage, hydrocephalus, extra-axial collection or mass lesion / mass effect. Prominence of the ventricles and sulci reflects moderate cortical volume loss. Mild cerebellar atrophy is noted. A chronic infarct is noted at the left occipital lobe, reflecting remote infarct. The brainstem and fourth ventricle are within normal limits. The basal ganglia are unremarkable in appearance. The cerebral hemispheres demonstrate grossly normal gray-white differentiation. No mass effect or midline shift is seen. Vascular: No hyperdense vessel or unexpected calcification. Skull: There is no evidence of fracture; visualized osseous structures are unremarkable in appearance. Sinuses/Orbits: The orbits are within normal limits. There is mild opacification of the left maxillary sinus. The remaining paranasal sinuses and mastoid air cells are well-aerated. Other: No significant soft tissue abnormalities are seen. IMPRESSION: 1. No acute intracranial pathology seen on CT. 2. Moderate cortical volume loss noted. 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 3. Mild opacification of the left maxillary sinus.  Electronically Signed   By: Garald Balding M.D.   On: 11/09/2017 02:58    Time Spent in minutes  30   Jani Gravel M.D on 11/10/2017 at 10:25 AM  Between 7am to 7pm - Pager - [phone]    After 7pm go to [email or web address] - password [facility]  [facility] -  Office  [phone]

## 2017-11-10 NOTE — Progress Notes (Signed)
  Echocardiogram 2D Echocardiogram has been performed.  Leta Jungling M 11/10/2017, 10:51 AM

## 2017-11-11 DIAGNOSIS — A419 Sepsis, unspecified organism: Secondary | ICD-10-CM

## 2017-11-11 DIAGNOSIS — K219 Gastro-esophageal reflux disease without esophagitis: Secondary | ICD-10-CM

## 2017-11-11 DIAGNOSIS — D649 Anemia, unspecified: Secondary | ICD-10-CM

## 2017-11-11 DIAGNOSIS — I10 Essential (primary) hypertension: Secondary | ICD-10-CM

## 2017-11-11 DIAGNOSIS — E440 Moderate protein-calorie malnutrition: Secondary | ICD-10-CM

## 2017-11-11 LAB — GLUCOSE, CAPILLARY: Glucose-Capillary: 82 mg/dL (ref 65–99)

## 2017-11-11 LAB — AEROBIC CULTURE  (SUPERFICIAL SPECIMEN)

## 2017-11-11 LAB — TYPE AND SCREEN
ABO/RH(D): A POS
Antibody Screen: NEGATIVE
UNIT DIVISION: 0
UNIT DIVISION: 0

## 2017-11-11 LAB — COMPREHENSIVE METABOLIC PANEL
ALBUMIN: 2.3 g/dL — AB (ref 3.5–5.0)
ALT: 19 U/L (ref 17–63)
AST: 15 U/L (ref 15–41)
Alkaline Phosphatase: 68 U/L (ref 38–126)
Anion gap: 14 (ref 5–15)
BUN: 8 mg/dL (ref 6–20)
CHLORIDE: 103 mmol/L (ref 101–111)
CO2: 23 mmol/L (ref 22–32)
Calcium: 8.4 mg/dL — ABNORMAL LOW (ref 8.9–10.3)
Creatinine, Ser: 0.71 mg/dL (ref 0.61–1.24)
GFR calc Af Amer: >60 mL/min (ref >60)
GFR calc non Af Amer: >60 mL/min (ref >60)
Glucose, Bld: 89 mg/dL (ref 65–99)
POTASSIUM: 3.4 mmol/L — AB (ref 3.5–5.1)
Sodium: 140 mmol/L (ref 135–145)
Total Bilirubin: 0.7 mg/dL (ref 0.3–1.2)
Total Protein: 6.4 g/dL — ABNORMAL LOW (ref 6.5–8.1)

## 2017-11-11 LAB — CBC
HEMATOCRIT: 35.6 % — AB (ref 39.0–52.0)
Hemoglobin: 11.5 g/dL — ABNORMAL LOW (ref 13.0–17.0)
MCH: 29 pg (ref 26.0–34.0)
MCHC: 32.3 g/dL (ref 30.0–36.0)
MCV: 89.7 fL (ref 78.0–100.0)
Platelets: 588 10*3/uL — ABNORMAL HIGH (ref 150–400)
RBC: 3.97 MIL/uL — ABNORMAL LOW (ref 4.22–5.81)
RDW: 15.9 % — AB (ref 11.5–15.5)
WBC: 15.3 10*3/uL — ABNORMAL HIGH (ref 4.0–10.5)

## 2017-11-11 LAB — BPAM RBC
BLOOD PRODUCT EXPIRATION DATE: 201906052359
Blood Product Expiration Date: 201906052359
ISSUE DATE / TIME: 201905191642
ISSUE DATE / TIME: 201905192039
Unit Type and Rh: 6200
Unit Type and Rh: 6200

## 2017-11-11 LAB — AEROBIC CULTURE W GRAM STAIN (SUPERFICIAL SPECIMEN)

## 2017-11-11 LAB — FOLATE RBC
Folate, Hemolysate: 470.9 ng/mL
Folate, RBC: 2131 ng/mL (ref >498)
HEMATOCRIT: 22.1 % — AB (ref 37.5–51.0)

## 2017-11-11 LAB — HIV ANTIBODY (ROUTINE TESTING W REFLEX): HIV Screen 4th Generation wRfx: NONREACTIVE

## 2017-11-11 MED ORDER — ENSURE ENLIVE PO LIQD
237.0000 mL | Freq: Two times a day (BID) | ORAL | Status: DC
  Administered 2017-11-11: 237 mL via ORAL

## 2017-11-11 NOTE — Care Management Note (Addendum)
Case Management Note  Patient Details  Name: Stephen Bowen MRN: [id number] Date of Birth: 1955-08-27  Subjective/Objective: History of  hypertension, hyperlipidemia, GERD, depression, CAD, PVD,s/p ofbilateral AKA, dCHF.  Admitted for Sepsis. PCP noted.  Action/Plan: Prior to admission patient resided at [facility].  Once patient is medically stable for discharge and bed available plan to discharge to SNF, per CSW "Stephen Bowen" arrangements.   Expected Discharge Date:   To Be determined               Expected Discharge Plan:  Skilled Nursing Facility  In-House Referral:  Clinical Social Work, Nutrition  Discharge planning Services  CM Consult  Status of Service:  In process, will continue to follow  If discussed at Long Length of Stay Meetings, dates discussed:    Additional Comments:  Yancey Flemings, RN 11/11/2017, 4:23 PM

## 2017-11-11 NOTE — Progress Notes (Signed)
Initial Nutrition Assessment  DOCUMENTATION CODES:   Non-severe (moderate) malnutrition in context of chronic illness  INTERVENTION:   -Pro-Stat BID  -Ensure Enlive po BID, each supplement provides 350 kcal and 20 grams of protein  -Continue MVI daily   NUTRITION DIAGNOSIS:   Moderate Malnutrition related to chronic illness as evidenced by mild fat depletion, mild muscle depletion.  GOAL:   Patient will meet greater than or equal to 90% of their needs   MONITOR:   PO intake, Supplement acceptance, Labs, Weight trends, Skin  REASON FOR ASSESSMENT:   Malnutrition Screening Tool    ASSESSMENT:   62 yo male admitted with sepsis with wound dehiscence of left leg stump, acute metabolic encephalopathy. Pt with revision of left AKA on 10/10/2017. Pt with hx of bilateral AKA, PAD, CAD, HTN, HLD, MI  Recorded po intake 0-50% of meals. Pt reports appetite is fair. Reports he eats only 1 meal per day at home, no nutritional supplements.   Pt is unsure if he has lost weight recently or not. Unsure of his UBW. Noted weighs have been in the 84 to 95 pound range for the past year. Weight of 84 pounds on 5/17, re-weighed at 102 pounds on 5/18. Need to obtain new weight.    Labs: potassium 3.4 Meds: vitamin D, folic acid, MVI, remeron  NUTRITION - FOCUSED PHYSICAL EXAM:    Most Recent Value  Orbital Region  Mild depletion  Upper Arm Region  No depletion  Thoracic and Lumbar Region  Mild depletion  Buccal Region  Mild depletion  Temple Region  Mild depletion  Clavicle Bone Region  Mild depletion  Clavicle and Acromion Bone Region  Mild depletion  Scapular Bone Region  Mild depletion  Dorsal Hand  Moderate depletion  Patellar Region  Unable to assess [bilateral AKA]  Posterior Calf Region  Unable to assess [bilateral AKA]  Edema (RD Assessment)  None       Diet Order:   Diet Order           Diet Heart Room service appropriate? Yes; Fluid consistency: Thin  Diet effective  now          EDUCATION NEEDS:   Education needs have been addressed  Skin:  Skin Assessment: Skin Integrity Issues: Skin Integrity Issues:: Other (Comment) Other: wound dehiscence of left AKA revision  Last BM:  5/20  Height:   Ht Readings from Last 1 Encounters:  11/08/17  (1.676 m)    Weight:   Wt Readings from Last 1 Encounters:  11/09/17 102 lb 15.3 oz (46.7 kg)    Ideal Body Weight:  56.8 kg with amputations   BMI:  Body mass index is 16.62 kg/m.  Estimated Nutritional Needs:   Kcal:  1500-1700 kcals  Protein:  75-85 g  Fluid:  >/= 1.5 L   Romelle Starcher MS, RD, LDN, CNSC [phone] Pager  [phone] Weekend/On-Call Pager

## 2017-11-11 NOTE — Progress Notes (Signed)
PROGRESS NOTE    Stephen Bowen  WUJ:[id number] DOB: 1955-12-17 DOA: 11/08/2017 PCP: Shelbie Ammons, MD    Brief Narrative:  62 year old male who presented with fever and altered mental status. He does have a significant past medical history for hypertension, dyslipidemia, GERD, depression, coronary artery disease, peripheral vascular disease status post bilateral AKA, and heart failure with reserved LV function.  On the day of admission patient had a revision of his left AKA done April 18 219, sutures were removed, and his wound was packed.  Subsequently he developed fever and altered mentation.  On his initial physical examination his heart rate was 107, respiratory rate 13, oxygen saturation is 98%.  His lungs were clear to auscultation bilaterally, heart S1-S2 present rhythmic, the abdomen soft nontender, noted bilateral above-the-knee amputations.  Left stump with bleeding, no pus, mild surrounding erythema.  Patient was confused and disoriented.  Sodium 136, potassium 4.1, chloride 109, bicarb 25, glucose 100, BUN 8, creatinine 0.80 white count 9.5, hemoglobin 11.9, hematocrit 36.9, platelets 400, urinalysis specific gravity 1.016, negative protein.  Head CT with no acute intracranial pathology, moderate cortical volume loss, chronic infarct at the left occipital lobe.  Chest x-ray with hyperinflation, no infiltrates.  EKG with sinus tachycardia 144 bpm.  Normal axis normal intervals.  Patient was admitted to the hospital working diagnosis of sepsis to rule out wound infection.  Assessment & Plan:   Principal Problem:   Sepsis (HCC) Active Problems:   Essential hypertension   Stroke (HCC)   HLD (hyperlipidemia)   GERD (gastroesophageal reflux disease)   Coronary atherosclerosis of native coronary artery   Acute metabolic encephalopathy   Leg wound, left   Anemia   Tachycardia   1.  Suspected sepsis. Patient has remained afebrile, wbc down to 15,3, blood cultures with no growth,  wound culture with MRSA. Will discuss with vascular surgery if antibiotic can be stopped. For now will continue with broad spectrum IV vancomycin and IV zosyn.   2.  Hypertension. Continue blood pressure control with metoprolol.   3.  Metabolic encephalopathy. Patient today alert, able to respond to questions appropriately. Will continue supportive medical therapy and neuro checks per unit protocol.   4.  Left wound stump infection. It is unclear if true infection, will continue local wound care.   5.  Chronic anemia. Stable with no signs of exacerbation.   6.  History of CVA. Chronic and stable. Continue asa and statin therapy.   7. Severe calorie protein malnutrition. Will continue nutritional supplements.   8. Depression. Continue mirtazapine.   DVT prophylaxis: enoxaparin   Code Status: full Family Communication: no family at the bedside  Disposition Plan: snf in 24 to 48 hours.    Consultants:   Vascular surgery  Procedures:     Antimicrobials:   IV vancomycin  IV zosyn    Subjective: Patient feeling pressure on his left stump, no nausea or vomiting, no dyspnea or chest pain.   Objective: Vitals:   11/10/17 2100 11/10/17 2356 11/11/17 0506 11/11/17 0937  BP: 133/83 140/84 140/82 112/80  Pulse: 84 75 73 96  Resp: Temp: 98.2 F (36.8 C) (!) 97.5 F (36.4 C) 97.6 F (36.4 C) 98.2 F (36.8 C)  TempSrc: Oral Oral Oral Oral  SpO2:   99% 97%  Weight:      Height:        Intake/Output Summary (Last 24 hours) at 11/11/2017 1247 Last data filed at 11/11/2017 9562 Gross  per 24 hour  Intake 1740 ml  Output 1300 ml  Net 440 ml   Filed Weights   11/08/17 2211 11/09/17 2042  Weight: 38.3 kg (84 lb 7 oz) 46.7 kg (102 lb 15.3 oz)    Examination:   General: Not in pain or dyspnea, deconditioned Neurology: Awake and alert, non focal  E ENT: mild pallor, no icterus, oral mucosa moist Cardiovascular: No JVD. S1-S2 present, rhythmic, no gallops,  rubs, or murmurs. Bilateral AKA Pulmonary: vesicular breath sounds bilaterally, adequate air movement, no wheezing, rhonchi or rales. Gastrointestinal. Abdomen with no organomegaly, non tender, no rebound or guarding Skin. Left stump with dressing in place.  Musculoskeletal: bilateral AKA     Data Reviewed: I have personally reviewed following labs and imaging studies  CBC: Recent Labs  Lab 11/08/17 2245 11/09/17 0302 11/10/17 0443 11/11/17 0713  WBC 22.7* 23.5* 19.1* 15.3*  NEUTROABS 19.3*  --   --   --   HGB 10.6* 7.5* 7.2* 11.5*  HCT 33.1* 23.4* 23.0* 35.6*  MCV 94.3 95.1 96.6 89.7  PLT 719* 614* 666* 588*   Basic Metabolic Panel: Recent Labs  Lab 11/08/17 2245 11/09/17 0302 11/10/17 0443 11/11/17 0521  NA 133* 137 137 140  K 4.7 4.3 4.0 3.4*  CL 95* 102 99* 103  CO2 GLUCOSE 101* 109* 89 89  BUN CREATININE 1.10 0.80 0.87 0.71  CALCIUM 9.3 8.1* 8.4* 8.4*   GFR: Estimated Creatinine Clearance: 64.1 mL/min (by C-G formula based on SCr of 0.71 mg/dL). Liver Function Tests: Recent Labs  Lab 11/08/17 2245 11/10/17 0443 11/11/17 0521  AST 20 13* 15  ALT ALKPHOS 92 79 68  BILITOT 0.8 0.7 0.7  PROT 7.1 6.2* 6.4*  ALBUMIN 2.7* 2.3* 2.3*   No results for input(s): LIPASE, AMYLASE in the last 168 hours. Recent Labs  Lab 11/09/17 0302  AMMONIA 22   Coagulation Profile: Recent Labs  Lab 11/09/17 0302  INR 1.15   Cardiac Enzymes: Recent Labs  Lab 11/09/17 0946 11/09/17 1726 11/10/17 0029  TROPONINI <0.03 <0.03 <0.03   BNP (last 3 results) No results for input(s): PROBNP in the last 8760 hours. HbA1C: No results for input(s): HGBA1C in the last 72 hours. CBG: Recent Labs  Lab 11/09/17 1040 11/10/17 0749 11/11/17 0742  GLUCAP 91 98 82   Lipid Profile: No results for input(s): CHOL, HDL, LDLCALC, TRIG, CHOLHDL, LDLDIRECT in the last 72 hours. Thyroid Function Tests: No results for input(s): TSH, T4TOTAL,  FREET4, T3FREE, THYROIDAB in the last 72 hours. Anemia Panel: Recent Labs    11/10/17 0443  VITAMINB12 1,193*  FERRITIN 252  TIBC 132*  IRON 24*      Radiology Studies: I have reviewed all of the imaging during this hospital visit personally     Scheduled Meds: . aspirin EC  81 mg Oral Daily  . atorvastatin  40 mg Oral q1800  . cholecalciferol  1,000 Units Oral Daily  . docusate sodium  100 mg Oral Daily  . feeding supplement (PRO-STAT SUGAR FREE 64)  30 mL Oral BID  . folic acid  1 mg Oral Daily  . lidocaine  1 patch Transdermal Daily  . metoprolol tartrate  25 mg Oral BID  . mirtazapine  15 mg Oral QHS  . multivitamin with minerals  1 tablet Oral Daily   Continuous Infusions: . piperacillin-tazobactam (ZOSYN)  IV Stopped (11/11/17 1221)  . vancomycin Stopped (11/11/17  0148)     LOS: 2 days        Mauricio Annett Gula, MD [facility] Pager [phone]

## 2017-11-11 NOTE — Progress Notes (Addendum)
      Says his left leg is sore  Pt continues to be afebrile.   Dressing change done:  Pt tender to lifting leg    No evidence of infection. Continue wet to dry dressing changes bid   Doreatha Massed, [facility] 11/11/2017 8:05 AM  Addendum  Patient's wound appears noninfected and we can continue with the dry dressings.   Sallie Staron C. Randie Heinz, MD [facility] [place] Office: [phone] Pager: [phone]

## 2017-11-12 DIAGNOSIS — R52 Pain, unspecified: Secondary | ICD-10-CM

## 2017-11-12 DIAGNOSIS — I63433 Cerebral infarction due to embolism of bilateral posterior cerebral arteries: Secondary | ICD-10-CM

## 2017-11-12 LAB — CBC WITH DIFFERENTIAL/PLATELET
Abs Immature Granulocytes: 0.7 10*3/uL — ABNORMAL HIGH (ref 0.0–0.1)
Basophils Absolute: 0.1 10*3/uL (ref 0.0–0.1)
Basophils Relative: 1 %
EOS PCT: 4 %
Eosinophils Absolute: 0.6 10*3/uL (ref 0.0–0.7)
HEMATOCRIT: 33.7 % — AB (ref 39.0–52.0)
HEMOGLOBIN: 10.9 g/dL — AB (ref 13.0–17.0)
Immature Granulocytes: 5 %
LYMPHS ABS: 1.2 10*3/uL (ref 0.7–4.0)
LYMPHS PCT: 8 %
MCH: 29.4 pg (ref 26.0–34.0)
MCHC: 32.3 g/dL (ref 30.0–36.0)
MCV: 90.8 fL (ref 78.0–100.0)
MONO ABS: 1.1 10*3/uL — AB (ref 0.1–1.0)
MONOS PCT: 8 %
Neutro Abs: 11.5 10*3/uL — ABNORMAL HIGH (ref 1.7–7.7)
Neutrophils Relative %: 76 %
Platelets: 637 10*3/uL — ABNORMAL HIGH (ref 150–400)
RBC: 3.71 MIL/uL — AB (ref 4.22–5.81)
RDW: 16.2 % — ABNORMAL HIGH (ref 11.5–15.5)
WBC: 15.1 10*3/uL — ABNORMAL HIGH (ref 4.0–10.5)

## 2017-11-12 LAB — BASIC METABOLIC PANEL
Anion gap: 9 (ref 5–15)
BUN: 9 mg/dL (ref 6–20)
CHLORIDE: 103 mmol/L (ref 101–111)
CO2: 27 mmol/L (ref 22–32)
CREATININE: 0.64 mg/dL (ref 0.61–1.24)
Calcium: 8.5 mg/dL — ABNORMAL LOW (ref 8.9–10.3)
GFR calc non Af Amer: >60 mL/min (ref >60)
Glucose, Bld: 100 mg/dL — ABNORMAL HIGH (ref 65–99)
Potassium: 3.3 mmol/L — ABNORMAL LOW (ref 3.5–5.1)
Sodium: 139 mmol/L (ref 135–145)

## 2017-11-12 LAB — GLUCOSE, CAPILLARY: Glucose-Capillary: 96 mg/dL (ref 65–99)

## 2017-11-12 MED ORDER — ENSURE ENLIVE PO LIQD: 237.0000 mL | Freq: Two times a day (BID) | ORAL | 0 refills | Status: AC

## 2017-11-12 MED ORDER — OXYCODONE-ACETAMINOPHEN 5-325 MG PO TABS: 1.0000 | ORAL_TABLET | Freq: Four times a day (QID) | ORAL | 0 refills | Status: DC | PRN

## 2017-11-12 MED ORDER — ACETAMINOPHEN 325 MG PO TABS: 650.0000 mg | ORAL_TABLET | Freq: Four times a day (QID) | ORAL | 0 refills | Status: DC | PRN

## 2017-11-12 MED ORDER — COLLAGENASE 250 UNIT/GM EX OINT
TOPICAL_OINTMENT | Freq: Every day | CUTANEOUS | Status: DC
  Filled 2017-11-12: qty 30

## 2017-11-12 MED ORDER — COLLAGENASE 250 UNIT/GM EX OINT: TOPICAL_OINTMENT | Freq: Every day | CUTANEOUS | 0 refills | Status: DC

## 2017-11-12 MED ORDER — PRO-STAT SUGAR FREE PO LIQD: 30.0000 mL | Freq: Two times a day (BID) | ORAL | 0 refills | Status: AC

## 2017-11-12 NOTE — Discharge Summary (Signed)
Physician Discharge Summary  Stephen Bowen UJW:[id number] DOB: 1956-02-15 DOA: 11/08/2017  PCP: Shelbie Ammons, MD  Admit date: 11/08/2017 Discharge date: 11/12/2017  Admitted From: snf Disposition:  snf  Recommendations for Outpatient Follow-up and new medication changes:  1. Follow up with Dr. Ardelle Park in 1- weeks 2. No clinical signs of wound infection, antibiotics have been held.  Patient will need a close follow-up on his wound, with low threshold to resume antibiotic therapy 3. Continue local wound care with Santyl wet to dry dressings of the left leg.  4. Follow up with vascular surgery as outpatient.   Home Health: na Equipment/Devices: na   Discharge Condition: stable  CODE STATUS: full  Diet recommendation: Heart healthy   Brief/Interim Summary: 62 year old male who presented with fever and altered mental status. He does have a significant past medical history for hypertension, dyslipidemia, GERD, depression, coronary artery disease, peripheral vascular disease status post bilateral AKA, and heart failure with preserved LV function.  On the day of admission patient had a revision of his left AKA done April 18 219, sutures were removed, and his wound was packed.  Subsequently he developed fever and altered mentation.  On his initial physical examination his heart rate was 107, respiratory rate 13, oxygen saturation is 98%.  His lungs were clear to auscultation bilaterally, heart S1-S2 present rhythmic, the abdomen soft nontender, noted bilateral above-the-knee amputations.  Left stump with bleeding, no pus, mild surrounding erythema.  Patient was confused and disoriented.  Sodium 136, potassium 4.1, chloride 109, bicarb 25, glucose 100, BUN 8, creatinine 0.80 white count 9.5, hemoglobin 11.9, hematocrit 36.9, platelets 400, urinalysis specific gravity 1.016, negative protein.  Head CT with no acute intracranial pathology, moderate cortical volume loss, chronic infarct at the left  occipital lobe.  Chest x-ray with hyperinflation, no infiltrates.  EKG with sinus tachycardia 144 bpm.  Normal axis normal intervals.  Patient was admitted to the hospital working diagnosis of suspected sepsis to rule out wound infection.  1.  Febrile syndrome, ruled out for sepsis.  Patient was admitted to the medical ward, he was placed on a remote telemetry monitor, he received IV fluids and broad-spectrum IV antibiotic therapy with IV vancomycin and IV Zosyn.  Patient remained afebrile, his blood cultures remained with no growth, his white cell count peaked at 23.5, discharge is down to 15.1. The wound was reviewed by vascular surgery finding no signs of local infection.  The antibiotics have been held over the last 24 hours, his white cell count has remained stable at 15.1 and he continued to be afebrile.  Patient will need to have a close follow-up of his surgical wound as an outpatient.  Culture from the wound grew rare MRSA, Gram stain with no organisms seen, taken in account the clinical picture likely might be contamination/colonization.  Patient will be discharged without antibiotic therapy.   2.  Metabolic encephalopathy.  Patient received supportive medical therapy with improvement of his symptoms.  No further confusion or agitation.  3.  Hypertension.  Continue blood pressure control with metoprolol.  4.  Anemia of iron deficiency.  Patient had hemoglobin and hematocrit monitoring, his hemoglobin reached a nadir of 7.2, received 2 units packed red blood cells with hemoglobin stabilizing at 10.9.  Iron studies showed iron 24, transferrin iron-binding capacity 132, transferrin saturation 18% with a ferritin of 252.   5.  Severe calorie protein malnutrition.  Patient has been placed on nutritional supplements.  6.  History of CVA.  Stable, continue aspirin and statin therapy  7.  Depression.  Continue mirtazapine.  Discharge Diagnoses:  Principal Problem:   Sepsis (HCC) Active  Problems:   Essential hypertension   Stroke (HCC)   HLD (hyperlipidemia)   GERD (gastroesophageal reflux disease)   Coronary atherosclerosis of native coronary artery   Acute metabolic encephalopathy   Leg wound, left   Anemia   Tachycardia   Malnutrition of moderate degree    Discharge Instructions   Allergies as of 11/12/2017   No Known Allergies     Medication List    STOP taking these medications   bisacodyl 5 MG EC tablet Commonly known as:  DULCOLAX   ciprofloxacin 500 MG tablet Commonly known as:  CIPRO   clindamycin 150 MG capsule Commonly known as:  CLEOCIN   clindamycin 300 MG capsule Commonly known as:  CLEOCIN   HYDROcodone-acetaminophen 5-325 MG tablet Commonly known as:  NORCO/VICODIN   lactulose 10 GM/15ML solution Commonly known as:  CHRONULAC     TAKE these medications   acetaminophen 325 MG tablet Commonly known as:  TYLENOL Take 2 tablets (650 mg total) by mouth every 6 (six) hours as needed for mild pain, fever or headache.   amLODipine 10 MG tablet Commonly known as:  NORVASC Take 1 tablet (10 mg total) by mouth daily.   ASPERCREME LIDOCAINE 4 % Ptch Generic drug:  Lidocaine Apply 1 patch topically daily. Apply to left shoulder   aspirin 81 MG EC tablet Take 1 tablet (81 mg total) by mouth daily.   atorvastatin 40 MG tablet Commonly known as:  LIPITOR Take 1 tablet (40 mg total) by mouth daily at 6 PM.   cholecalciferol 1000 units tablet Commonly known as:  VITAMIN D Take 1,000 Units by mouth daily.   clopidogrel 75 MG tablet Commonly known as:  PLAVIX Take 1 tablet (75 mg total) by mouth daily.   collagenase ointment Commonly known as:  SANTYL Apply topically daily.   docusate sodium 100 MG capsule Commonly known as:  COLACE Take 1 capsule (100 mg total) by mouth daily.   feeding supplement (ENSURE ENLIVE) Liqd Take 237 mLs by mouth 2 (two) times daily between meals.   feeding supplement (PRO-STAT SUGAR FREE 64)  Liqd Take 30 mLs by mouth 2 (two) times daily.   folic acid 1 MG tablet Commonly known as:  FOLVITE Take 1 tablet (1 mg total) by mouth daily.   metoprolol tartrate 25 MG tablet Commonly known as:  LOPRESSOR Take 1 tablet (25 mg total) by mouth 2 (two) times daily. What changed:  additional instructions   mirtazapine 15 MG tablet Commonly known as:  REMERON Take 15 mg by mouth at bedtime.   oxyCODONE-acetaminophen 5-325 MG tablet Commonly known as:  PERCOCET/ROXICET Take 1 tablet by mouth every 6 (six) hours as needed for severe pain. What changed:    when to take this  reasons to take this   pantoprazole 20 MG tablet Commonly known as:  PROTONIX Take 20 mg by mouth daily as needed for heartburn or indigestion.   polyethylene glycol packet Commonly known as:  MIRALAX / GLYCOLAX Take 17 g by mouth daily.   PROTEIN PO Take 30 mLs by mouth 3 (three) times daily.   SENNA-PLUS 8.6-50 MG tablet Generic drug:  senna-docusate Take 2 tablets by mouth at bedtime.   spironolactone 25 MG tablet Commonly known as:  ALDACTONE Take 1 tablet (25 mg total) by mouth daily.   TAB-A-VITE PO Take 1 tablet  by mouth daily.   UNABLE TO FIND Take 60 mLs by mouth 3 (three) times daily. Med Name: Medpass 2.0       No Known Allergies  Consultations:  Vascular surgery    Procedures/Studies: Dg Chest 1 View  Result Date: 11/08/2017 CLINICAL DATA:  Pain and fever EXAM: CHEST  1 VIEW COMPARISON:  10/18/2012 FINDINGS: The heart size and mediastinal contours are within normal limits. The lungs are mildly hyperinflated without acute pulmonary consolidation or CHF. Remote left-sided rib fractures. IMPRESSION: No active disease. Electronically Signed   By: Tollie Eth M.D.   On: 11/08/2017 23:28   Ct Head Wo Contrast  Result Date: 11/09/2017 CLINICAL DATA:  Acute onset of altered level of consciousness. EXAM: CT HEAD WITHOUT CONTRAST TECHNIQUE: Contiguous axial images were obtained from  the base of the skull through the vertex without intravenous contrast. COMPARISON:  CT of the head performed 07/31/2016 FINDINGS: Brain: No evidence of acute infarction, hemorrhage, hydrocephalus, extra-axial collection or mass lesion / mass effect. Prominence of the ventricles and sulci reflects moderate cortical volume loss. Mild cerebellar atrophy is noted. A chronic infarct is noted at the left occipital lobe, reflecting remote infarct. The brainstem and fourth ventricle are within normal limits. The basal ganglia are unremarkable in appearance. The cerebral hemispheres demonstrate grossly normal gray-white differentiation. No mass effect or midline shift is seen. Vascular: No hyperdense vessel or unexpected calcification. Skull: There is no evidence of fracture; visualized osseous structures are unremarkable in appearance. Sinuses/Orbits: The orbits are within normal limits. There is mild opacification of the left maxillary sinus. The remaining paranasal sinuses and mastoid air cells are well-aerated. Other: No significant soft tissue abnormalities are seen. IMPRESSION: 1. No acute intracranial pathology seen on CT. 2. Moderate cortical volume loss noted. 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 3. Mild opacification of the left maxillary sinus. Electronically Signed   By: Roanna Raider M.D.   On: 11/09/2017 02:58       Subjective: Patient is feeling better, pain has improved at the left stump, no dyspnea, no chest pain, no nausea or vomiting.   Discharge Exam: Vitals:   11/12/17 0439 11/12/17 0753  BP: 138/76 115/77  Pulse: 80 70  Resp: 16 18  Temp: 98.5 F (36.9 C) 97.8 F (36.6 C)  SpO2: 95% 95%   Vitals:   11/11/17 0937 11/11/17 2007 11/12/17 0439 11/12/17 0753  BP: 112/80 117/75 138/76 115/77  Pulse: 96 87 80 70  Resp: Temp: 98.2 F (36.8 C) 98.8 F (37.1 C) 98.5 F (36.9 C) 97.8 F (36.6 C)  TempSrc: Oral Oral Oral Oral  SpO2: 97%  97% 95% 95%  Weight:  45 kg (99 lb 3.3 oz)    Height:        General: Not in pain or dyspnea, deconditioned Neurology: Awake and alert, non focal  E ENT: mild pallor, no icterus, oral mucosa moist Cardiovascular: No JVD. S1-S2 present, rhythmic, no gallops, rubs, or murmurs. No lower extremity edema. Pulmonary: vesicular breath sounds bilaterally, adequate air movement, no wheezing, rhonchi or rales. Gastrointestinal. Abdomen with no organomegaly, non tender, no rebound or guarding Skin. Left stump wound with dressing in placed, wound packed, no purulence.  Musculoskeletal: bilateral AKA.     Wound appearance on the day of discharge.    The results of significant diagnostics from this hospitalization (including imaging, microbiology, ancillary and laboratory) are listed below for reference.     Microbiology: Recent  Results (from the past 240 hour(s))  Blood Culture (routine x 2)     Status: None (Preliminary result)   Collection Time: 11/08/17 10:45 PM  Result Value Ref Range Status   Specimen Description BLOOD RIGHT ANTECUBITAL  Final   Special Requests   Final    BOTTLES DRAWN AEROBIC AND ANAEROBIC Blood Culture adequate volume   Culture   Final    NO GROWTH 3 DAYS Performed at [facility], [address] [place], [place] [address]    Report Status PENDING  Incomplete  Blood Culture (routine x 2)     Status: None (Preliminary result)   Collection Time: 11/08/17 11:00 PM  Result Value Ref Range Status   Specimen Description BLOOD RIGHT HAND  Final   Special Requests   Final    BOTTLES DRAWN AEROBIC ONLY Blood Culture results may not be optimal due to an inadequate volume of blood received in culture bottles   Culture   Final    NO GROWTH 3 DAYS Performed at [facility], [address] [place], [place] [address]    Report Status PENDING  Incomplete  Urine culture     Status: None   Collection Time: 11/09/17 12:52 AM  Result Value Ref Range Status    Specimen Description URINE, RANDOM  Final   Special Requests NONE  Final   Culture   Final    NO GROWTH Performed at [facility], [address], [place], [place] [address]    Report Status 11/10/2017 FINAL  Final  Aerobic Culture (superficial specimen)     Status: None   Collection Time: 11/09/17 10:42 AM  Result Value Ref Range Status   Specimen Description WOUND LEFT LEG  Final   Special Requests NONE  Final   Gram Stain   Final    RARE WBC PRESENT,BOTH PMN AND MONONUCLEAR NO ORGANISMS SEEN Performed at [facility], [address] [place], [place] [address]    Culture RARE METHICILLIN RESISTANT STAPHYLOCOCCUS AUREUS  Final   Report Status 11/11/2017 FINAL  Final   Organism ID, Bacteria METHICILLIN RESISTANT STAPHYLOCOCCUS AUREUS  Final      Susceptibility   Methicillin resistant staphylococcus aureus - MIC*    CIPROFLOXACIN >=8 RESISTANT Resistant     ERYTHROMYCIN >=8 RESISTANT Resistant     GENTAMICIN <=0.5 SENSITIVE Sensitive     OXACILLIN >=4 RESISTANT Resistant     TETRACYCLINE <=1 SENSITIVE Sensitive     VANCOMYCIN <=0.5 SENSITIVE Sensitive     TRIMETH/SULFA <=10 SENSITIVE Sensitive     CLINDAMYCIN >=8 RESISTANT Resistant     RIFAMPIN <=0.5 SENSITIVE Sensitive     Inducible Clindamycin NEGATIVE Sensitive     * RARE METHICILLIN RESISTANT STAPHYLOCOCCUS AUREUS  MRSA PCR Screening     Status: None   Collection Time: 11/09/17  5:52 PM  Result Value Ref Range Status   MRSA by PCR NEGATIVE NEGATIVE Final    Comment:        The GeneXpert MRSA Assay (FDA approved for NASAL specimens only), is one component of a comprehensive MRSA colonization surveillance program. It is not intended to diagnose MRSA infection nor to guide or monitor treatment for MRSA infections. Performed at [facility], [address] [place], [place] [address]      Labs: BNP (last 3 results) Recent Labs    11/09/17 0302  BNP 48.8   Basic Metabolic Panel: Recent  Labs  Lab 11/08/17 2245 11/09/17 0302  11/10/17 0443 11/11/17 0521 11/12/17 0538  NA 133* 137 137 140 139  K 4.7 4.3 4.0 3.4* 3.3*  CL 95* 102 99* 103 103  CO2 26 24 24 23 27   GLUCOSE 101* 109* 89 89 100*  BUN 12 8 14 8 9   CREATININE 1.10 0.80 0.87 0.71 0.64  CALCIUM 9.3 8.1* 8.4* 8.4* 8.5*   Liver Function Tests: Recent Labs  Lab 11/08/17 2245 11/10/17 0443 11/11/17 0521  AST 20 13* 15  ALT 25 18 19   ALKPHOS 92 79 68  BILITOT 0.8 0.7 0.7  PROT 7.1 6.2* 6.4*  ALBUMIN 2.7* 2.3* 2.3*   No results for input(s): LIPASE, AMYLASE in the last 168 hours. Recent Labs  Lab 11/09/17 0302  AMMONIA 22   CBC: Recent Labs  Lab 11/08/17 2245 11/09/17 0302 11/10/17 0443 11/11/17 0713 11/12/17 0538  WBC 22.7* 23.5* 19.1* 15.3* 15.1*  NEUTROABS 19.3*  --   --   --  11.5*  HGB 10.6* 7.5* 7.2* 11.5* 10.9*  HCT 33.1* 23.4* 23.0*  22.1* 35.6* 33.7*  MCV 94.3 95.1 96.6 89.7 90.8  PLT 719* 614* 666* 588* 637*   Cardiac Enzymes: Recent Labs  Lab 11/09/17 0946 11/09/17 1726 11/10/17 0029  TROPONINI <0.03 <0.03 <0.03   BNP: Invalid input(s): POCBNP CBG: Recent Labs  Lab 11/09/17 1040 11/10/17 0749 11/11/17 0742 11/12/17 0751  GLUCAP 91 98 82 96   D-Dimer No results for input(s): DDIMER in the last 72 hours. Hgb A1c No results for input(s): HGBA1C in the last 72 hours. Lipid Profile No results for input(s): CHOL, HDL, LDLCALC, TRIG, CHOLHDL, LDLDIRECT in the last 72 hours. Thyroid function studies No results for input(s): TSH, T4TOTAL, T3FREE, THYROIDAB in the last 72 hours.  Invalid input(s): FREET3 Anemia work up Recent Labs    11/10/17 0443  VITAMINB12 1,193*  FERRITIN 252  TIBC 132*  IRON 24*   Urinalysis    Component Value Date/Time   COLORURINE YELLOW 11/08/2017 0031   APPEARANCEUR CLEAR 11/08/2017 0031   LABSPEC 1.016 11/08/2017 0031   PHURINE 5.0 11/08/2017 0031   GLUCOSEU NEGATIVE 11/08/2017 0031   HGBUR SMALL (A) 11/08/2017 0031    BILIRUBINUR NEGATIVE 11/08/2017 0031   KETONESUR 20 (A) 11/08/2017 0031   PROTEINUR NEGATIVE 11/08/2017 0031   UROBILINOGEN 0.2 11/13/2013 1634   NITRITE NEGATIVE 11/08/2017 0031   LEUKOCYTESUR NEGATIVE 11/08/2017 0031   Sepsis Labs Invalid input(s): PROCALCITONIN,  WBC,  LACTICIDVEN Microbiology Recent Results (from the past 240 hour(s))  Blood Culture (routine x 2)     Status: None (Preliminary result)   Collection Time: 11/08/17 10:45 PM  Result Value Ref Range Status   Specimen Description BLOOD RIGHT ANTECUBITAL  Final   Special Requests   Final    BOTTLES DRAWN AEROBIC AND ANAEROBIC Blood Culture adequate volume   Culture   Final    NO GROWTH 3 DAYS Performed at [facility], [address], [place], [place] [address]    Report Status PENDING  Incomplete  Blood Culture (routine x 2)     Status: None (Preliminary result)   Collection Time: 11/08/17 11:00 PM  Result Value Ref Range Status   Specimen Description BLOOD RIGHT HAND  Final   Special Requests   Final    BOTTLES DRAWN AEROBIC ONLY Blood Culture results may not be optimal due to an inadequate volume of blood received in culture bottles   Culture   Final    NO GROWTH 3 DAYS Performed at [facility], [address], [place], [place] [address]    Report Status PENDING  Incomplete  Urine culture     Status: None   Collection Time: 11/09/17 12:52 AM  Result Value Ref Range Status   Specimen Description URINE, RANDOM  Final   Special Requests NONE  Final   Culture   Final    NO GROWTH Performed at [facility], [address], [place], [place] [address]    Report Status 11/10/2017 FINAL  Final  Aerobic Culture (superficial specimen)     Status: None   Collection Time: 11/09/17 10:42 AM  Result Value Ref Range Status   Specimen Description WOUND LEFT LEG  Final   Special Requests NONE  Final   Gram Stain   Final    RARE WBC PRESENT,BOTH PMN AND MONONUCLEAR NO ORGANISMS SEEN Performed at  [facility], [address], [place], [place] [address]    Culture RARE METHICILLIN RESISTANT STAPHYLOCOCCUS AUREUS  Final   Report Status 11/11/2017 FINAL  Final   Organism ID, Bacteria METHICILLIN RESISTANT STAPHYLOCOCCUS AUREUS  Final      Susceptibility   Methicillin resistant staphylococcus aureus - MIC*    CIPROFLOXACIN >=8 RESISTANT Resistant     ERYTHROMYCIN >=8 RESISTANT Resistant     GENTAMICIN <=0.5 SENSITIVE Sensitive     OXACILLIN >=4 RESISTANT Resistant     TETRACYCLINE <=1 SENSITIVE Sensitive     VANCOMYCIN <=0.5 SENSITIVE Sensitive     TRIMETH/SULFA <=10 SENSITIVE Sensitive     CLINDAMYCIN >=8 RESISTANT Resistant     RIFAMPIN <=0.5 SENSITIVE Sensitive     Inducible Clindamycin NEGATIVE Sensitive     * RARE METHICILLIN RESISTANT STAPHYLOCOCCUS AUREUS  MRSA PCR Screening     Status: None   Collection Time: 11/09/17  5:52 PM  Result Value Ref Range Status   MRSA by PCR NEGATIVE NEGATIVE Final    Comment:        The GeneXpert MRSA Assay (FDA approved for NASAL specimens only), is one component of a comprehensive MRSA colonization surveillance program. It is not intended to diagnose MRSA infection nor to guide or monitor treatment for MRSA infections. Performed at [facility], [address] [place], [place] [address]      Time coordinating discharge: 45 minutes  SIGNED:   Coralie Keens, MD  [facility] 11/12/2017, 12:05 PM Pager [phone]  If 7PM-7AM, please contact night-coverage [email or web address] Password [email or web address]

## 2017-11-12 NOTE — Progress Notes (Signed)
ID called and stated wound cultures has resulted MRSA.  Pt contact precautions initiated.

## 2017-11-12 NOTE — Progress Notes (Signed)
Patient discharged to [facility] and rehab,patient transported by [facility]. Belongings sent with patient.Epifania Gore, Drinda Butts, RN

## 2017-11-12 NOTE — Progress Notes (Signed)
  Progress Note    11/12/2017 10:16 AM * No surgery found *  Subjective: No acute issues currently eating breakfast  Vitals:   11/12/17 0439 11/12/17 0753  BP: 138/76 115/77  Pulse: 80 70  Resp: 16 18  Temp: 98.5 F (36.9 C) 97.8 F (36.6 C)  SpO2: 95% 95%    Physical Exam he is awake and alert Nonlabored respirations Abdomen is soft Left lateral above-knee amputation site with some tissue necrosis debrided at bedside to bleeding tissue and packed with wet-to-dry   CBC    Component Value Date/Time   WBC 15.1 (H) 11/12/2017 0538   RBC 3.71 (L) 11/12/2017 0538   HGB 10.9 (L) 11/12/2017 0538   HCT 33.7 (L) 11/12/2017 0538   HCT 22.1 (L) 11/10/2017 0443   PLT 637 (H) 11/12/2017 0538   MCV 90.8 11/12/2017 0538   MCH 29.4 11/12/2017 0538   MCHC 32.3 11/12/2017 0538   RDW 16.2 (H) 11/12/2017 0538   LYMPHSABS 1.2 11/12/2017 0538   MONOABS 1.1 (H) 11/12/2017 0538   EOSABS 0.6 11/12/2017 0538   BASOSABS 0.1 11/12/2017 0538    BMET    Component Value Date/Time   NA 139 11/12/2017 0538   K 3.3 (L) 11/12/2017 0538   CL 103 11/12/2017 0538   CO2 27 11/12/2017 0538   GLUCOSE 100 (H) 11/12/2017 0538   BUN 9 11/12/2017 0538   CREATININE 0.64 11/12/2017 0538   CREATININE 0.92 11/13/2013 1031   CALCIUM 8.5 (L) 11/12/2017 0538   GFRNONAA >60 11/12/2017 0538   GFRNONAA >89 11/13/2013 1031   GFRAA >60 11/12/2017 0538   GFRAA >89 11/13/2013 1031    INR    Component Value Date/Time   INR 1.15 11/09/2017 0302     Intake/Output Summary (Last 24 hours) at 11/12/2017 1016 Last data filed at 11/12/2017 1610 Gross per 24 hour  Intake 350 ml  Output 350 ml  Net 0 ml     Assessment:  62 y.o. male is here with fever and leukocytosis and wound on his left lateral above-knee amputation site.  Does not appear infected.  Plan: We will order Santyl for wet-to-dry dressings of left leg.   Carmilla Granville C. Randie Heinz, MD [facility] [place] Office:  [phone] Pager: [phone]  11/12/2017 10:16 AM

## 2017-11-12 NOTE — Clinical Social Work Note (Signed)
Clinical Social Work Assessment  Patient Details  Name: Stephen Bowen MRN: [id number] Date of Birth: 08-05-1955  Date of referral:  11/12/17               Reason for consult:  Discharge Planning, Facility Placement                Permission sought to share information with:  Family Supports Permission granted to share information::  Yes, Verbal Permission Granted  Name::     Rumeal Cullipher  Agency::     Relationship::  Brother  Contact Information:  [phone]  Housing/Transportation Living arrangements for the past 2 months:  Skilled Nursing Facility([facility]) Source of Information:  Patient, Facility Patient Interpreter Needed:  None Criminal Activity/Legal Involvement Pertinent to Current Situation/Hospitalization:  No - Comment as needed Significant Relationships:  Siblings Lives with:  Facility Resident([facility]) Do you feel safe going back to the place where you live?  Yes Need for family participation in patient care:  Yes (Comment)  Care giving concerns: Patient expressed no concerns regarding care at [facility].    Social Worker assessment / plan:  CSW talked with patient at bedside regarding his discharge disposition. Mr. Golaszewski was sitting up in bed and was awake and alert. Patient confirmed that he came from [facility] and plans to return there. When asked, patient could not remember how long he has been at facility. When asked who to contact regarding his discharge, he initially said his cousin, but could not give CSW name or number. When brother mentioned, patient agreed that CSW could contact him.  CSW contacted [facility] and confirmed that patient is LTC at their facility and has been there over a year. CSW also advised that patient can return.  Employment status:  Disabled (Comment on whether or not currently receiving Disability) Insurance information:  Medicaid In [place] PT Recommendations:   Not assessed at this time Information / Referral to community resources:  Other (Comment Required)(None needed or requested as patient from a skilled facility and will d/c back to facility)  Patient/Family's Response to care: Mr. Goya did not express any concerns regarding his care during hospitalization.  Patient/Family's Understanding of and Emotional Response to Diagnosis, Current Treatment, and Prognosis:  Patient talked briefly about his amputations but did not discuss why he is currently at hospital.  Emotional Assessment Appearance:  Appears older than stated age Attitude/Demeanor/Rapport:  Engaged Affect (typically observed):  Pleasant, Appropriate Orientation:  Oriented to Self, Oriented to Situation, Oriented to Place Alcohol / Substance use:  Tobacco Use, Alcohol Use, Illicit Drugs(Patient reported that he quit smoking and has a history of consuming beer. Patient reported that he does not use illicit drugs) Psych involvement (Current and /or in the community):  No (Comment)  Discharge Needs  Concerns to be addressed:  Discharge Planning Concerns Readmission within the last 30 days:  No Current discharge risk:  None Barriers to Discharge:  No Barriers Identified   Cristobal Goldmann, LCSW 11/12/2017, 4:40 PM

## 2017-11-12 NOTE — Clinical Social Work Note (Signed)
Patient medically stable for discharge and is returning to [facility] where he is a LTC resident. Facility admissions staff person contacted and Morrie Sheldon informed of patient's readiness for discharge. Discharge clinicals transmitted to facility. Brother, Dereke Neumann [phone]) contacted and informed of discharge. CSW signing off as patient discharging back to SNF.   Genelle Bal, MSW, LCSW Licensed Clinical Social Worker Clinical Social Work Department [employer] [phone]

## 2017-11-12 NOTE — Progress Notes (Signed)
Called report to [place] at [facility].

## 2017-11-12 NOTE — NC FL2 (Signed)
[place] MEDICAID FL2 LEVEL OF CARE SCREENING TOOL     IDENTIFICATION  Patient Name: Stephen Bowen Birthdate: [birth year] Sex: male Admission Date (Current Location): 11/08/2017  [facility] and [place] Number:  [facility] [id number] N Facility and Address:  [facility], [address], [place], [place] [address]      Provider Number: [id number]  Attending Physician Name and Address:  Coralie Keens,*  Relative Name and Phone Number:  Ashtin Rosner - brother - [phone]    Current Level of Care: Hospital Recommended Level of Care: Skilled Nursing Facility Prior Approval Number:    Date Approved/Denied:   PASRR Number:    Discharge Plan: SNF([facility])    Current Diagnoses: Patient Active Problem List   Diagnosis Date Noted  . Malnutrition of moderate degree 11/11/2017  . Sepsis (HCC) 11/09/2017  . Acute metabolic encephalopathy 11/09/2017  . Leg wound, left 11/09/2017  . Anemia 11/09/2017  . Tachycardia 11/09/2017  . Non-healing wound of amputation stump (HCC) 10/08/2017  . Gangrene of right foot (HCC) 12/04/2016  . Wound of left leg 09/03/2016  . 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 Aortic atherosclerosis (HCC) 08/01/2016  . Coronary atherosclerosis of native coronary artery 08/01/2016  . Underweight 08/01/2016  . AKI (acute kidney injury) (HCC) 07/30/2016  . Limb ischemia 07/30/2016  . HLD (hyperlipidemia) 07/30/2016  . GERD (gastroesophageal reflux disease) 07/30/2016  . Acute encephalopathy 07/30/2016  . Abdominal pain 07/30/2016  . Pressure injury of skin 06/28/2016  . 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 Aphasia   . Hypotension 06/21/2016  . Stroke (HCC) 06/21/2016  . Acute MI, anterolateral wall, initial episode of care (HCC)   . 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 Preoperative cardiovascular examination   . PAD (peripheral artery  disease) (HCC) 02/13/2016  . Leg pain 02/13/2016  . Hyperkalemia 02/13/2016  . Lactic acid increased 02/13/2016  . Essential hypertension 02/13/2016  . Seborrheic dermatitis 08/31/2015  . Actinic keratosis of multiple sites of head and neck 08/31/2015  . Tobacco use disorder 08/31/2015  . Skin ulcer of scalp (HCC) 08/31/2015  . Atherosclerosis of native arteries of extremity with intermittent claudication (HCC) 07/07/2015  . Abnormal EKG 11/13/2013  . Hypertensive urgency 11/13/2013    Orientation RESPIRATION BLADDER Height & Weight     Self, Time, Situation, Place  Normal Continent Weight: 99 lb 3.3 oz (45 kg) Height:   (167.6 cm)  BEHAVIORAL SYMPTOMS/MOOD NEUROLOGICAL BOWEL NUTRITION STATUS      Incontinent Diet(Low sodium - Heart healthy)  AMBULATORY STATUS COMMUNICATION OF NEEDS Skin   Total Care(Patient has bilateral AKA's) Verbally Other (Comment)(Incision to anterior distal left thigh)                       Personal Care Assistance Level of Assistance  Bathing, Feeding, Dressing, Total care Bathing Assistance: Limited assistance Feeding assistance: Independent Dressing Assistance: Limited assistance     Functional Limitations Info  Sight, Hearing, Speech Sight Info: Impaired Hearing Info: Adequate Speech Info: Adequate    SPECIAL CARE FACTORS FREQUENCY                       Contractures Contractures Info: Not present    Additional Factors Info  Code Status, Allergies, Isolation Precautions Code Status Info: Full Allergies Info: No known allergies     Isolation Precautions Info:  HX MRSA     Current Medications (11/12/2017):  This is the current hospital active medication list Current Facility-Administered Medications  Medication Dose Route Frequency Provider Last Rate Last Dose  . acetaminophen (TYLENOL) tablet 650 mg  650 mg Oral Q6H PRN Lorretta Harp, MD   650 mg at 11/11/17 0001  . aspirin EC tablet 81 mg  81 mg Oral Daily Lorretta Harp, MD    81 mg at 11/12/17 0953  . atorvastatin (LIPITOR) tablet 40 mg  40 mg Oral q1800 Lorretta Harp, MD   40 mg at 11/11/17 1838  . cholecalciferol (VITAMIN D) tablet 1,000 Units  1,000 Units Oral Daily Lorretta Harp, MD   1,000 Units at 11/12/17 [phone]  . collagenase (SANTYL) ointment   Topical Daily Maeola Harman, MD      . docusate sodium (COLACE) capsule 100 mg  100 mg Oral Daily Lorretta Harp, MD   100 mg at 11/11/17 1143  . feeding supplement (ENSURE ENLIVE) (ENSURE ENLIVE) liquid 237 mL  237 mL Oral BID BM Arrien, York Ram, MD   237 mL at 11/11/17 1548  . feeding supplement (PRO-STAT SUGAR FREE 64) liquid 30 mL  30 mL Oral BID Pearson Grippe, MD   30 mL at 11/11/17 2137  . folic acid (FOLVITE) tablet 1 mg  1 mg Oral Daily Lorretta Harp, MD   1 mg at 11/12/17 0953  . hydrALAZINE (APRESOLINE) injection 5 mg  5 mg Intravenous Q2H PRN Lorretta Harp, MD      . lidocaine (LIDODERM) 5 % 1 patch  1 patch Transdermal Daily Lorretta Harp, MD   1 patch at 11/12/17 [phone]  . metoprolol tartrate (LOPRESSOR) tablet 25 mg  25 mg Oral BID Lorretta Harp, MD   25 mg at 11/12/17 0953  . mirtazapine (REMERON) tablet 15 mg  15 mg Oral QHS Lorretta Harp, MD   15 mg at 11/11/17 2137  . multivitamin with minerals tablet 1 tablet  1 tablet Oral Daily Lorretta Harp, MD   1 tablet at 11/12/17 [phone]  . ondansetron (ZOFRAN) injection 4 mg  4 mg Intravenous Q8H PRN Lorretta Harp, MD      . oxyCODONE-acetaminophen (PERCOCET/ROXICET) 5-325 MG per tablet 1 tablet  1 tablet Oral Q6H PRN Pearson Grippe, MD   1 tablet at 11/12/17 0448  . pantoprazole (PROTONIX) EC tablet 20 mg  20 mg Oral Daily PRN Lorretta Harp, MD      . polyethylene glycol (MIRALAX / GLYCOLAX) packet 17 g  17 g Oral Daily PRN Lorretta Harp, MD      . zolpidem (AMBIEN) tablet 5 mg  5 mg Oral QHS PRN Lorretta Harp, MD         Discharge Medications: Please see discharge summary for a list of discharge medications.  Relevant Imaging Results:  Relevant Lab Results:   Additional  Information ss#[id number].  Cristobal Goldmann, LCSW

## 2017-11-13 LAB — CULTURE, BLOOD (ROUTINE X 2)
CULTURE: NO GROWTH
Culture: NO GROWTH
Special Requests: ADEQUATE

## 2017-11-14 LAB — MULTIPLE MYELOMA PANEL, SERUM
ALBUMIN SERPL ELPH-MCNC: 2.4 g/dL — AB (ref 2.9–4.4)
ALBUMIN/GLOB SERPL: 0.8 (ref 0.7–1.7)
ALPHA 1: 0.4 g/dL (ref 0.0–0.4)
ALPHA2 GLOB SERPL ELPH-MCNC: 1.5 g/dL — AB (ref 0.4–1.0)
B-Globulin SerPl Elph-Mcnc: 0.9 g/dL (ref 0.7–1.3)
GLOBULIN, TOTAL: 3.3 g/dL (ref 2.2–3.9)
Gamma Glob SerPl Elph-Mcnc: 0.5 g/dL (ref 0.4–1.8)
IGG (IMMUNOGLOBIN G), SERUM: 555 mg/dL — AB (ref 700–1600)
IGM (IMMUNOGLOBULIN M), SRM: 47 mg/dL (ref 20–172)
IgA: 273 mg/dL (ref 61–437)
Total Protein ELP: 5.7 g/dL — ABNORMAL LOW (ref 6.0–8.5)

## 2017-12-03 ENCOUNTER — Other Ambulatory Visit: Payer: Self-pay

## 2017-12-03 ENCOUNTER — Ambulatory Visit (INDEPENDENT_AMBULATORY_CARE_PROVIDER_SITE_OTHER): Payer: Medicaid Other | Admitting: Family

## 2017-12-03 ENCOUNTER — Encounter: Payer: Self-pay | Admitting: Family

## 2017-12-03 VITALS — BP 110/81 | HR 85 | Temp 97.4°F | Resp 16

## 2017-12-03 DIAGNOSIS — T8789 Other complications of amputation stump: Secondary | ICD-10-CM

## 2017-12-03 DIAGNOSIS — Z89612 Acquired absence of left leg above knee: Secondary | ICD-10-CM

## 2017-12-03 DIAGNOSIS — I739 Peripheral vascular disease, unspecified: Secondary | ICD-10-CM

## 2017-12-03 DIAGNOSIS — Z89611 Acquired absence of right leg above knee: Secondary | ICD-10-CM

## 2017-12-03 NOTE — Progress Notes (Addendum)
Postoperative Visit   History of Present Illness  Stephen Bowen is a 62 y.o. male who is s/p revision left above-knee amputation on 10-08-17 by Dr. 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 Findings: There was no gross infection.  There is an occluded PTFE graft that was transected.  At completion all tissue was healthy and the flaps approximated easily.  Pt has a previous right AKA.   Dr. Randie Heinz saw pt on 11-08-17. At that time he open the left lateral aspect 3 x 2 cm and packed this.  He will need at least daily wet-to-dry dressing changes.  Staples were removed that day. Dr. Randie Heinz advised follow-up in 3 to 4 weeks for wound check.  Pt returns today for wound check.  He is a resident of [facility] rehab center.  He denies pain, denies fever or chills.   Left AKA has an open wound that is draining, no erythema. See photos below.   Pt was admitted to [facility] through the ED on 11-08-17 for altered mental status, sepsis. Pt is oriented to person and place only.  He converses and answers questions appropriately.    For VQI Use Only  PRE-ADM LIVING: Nursing home  AMB STATUS: Wheelchair   Past Medical History:  Diagnosis Date  . Coronary artery disease   . Encephalopathy acute 07/2016  . HTN (hypertension)   . Hyperlipemia   . Myocardial infarction (HCC)   . Non-healing surgical wound    left AKA  . Noncompliance   . PAD (peripheral artery disease) (HCC)    a.  s/p left femoral to PT artery bypass with propaten on 02/16/16.    Past Surgical History:  Procedure Laterality Date  . ABDOMINAL AORTOGRAM W/LOWER EXTREMITY N/A 08/02/2016   Procedure: Abdominal Aortogram w/Lower Extremity;  Surgeon: Maeola Harman, MD;  Location: [facility] INVASIVE CV LAB;  Service: Cardiovascular;  Laterality: N/A;  . ABOVE KNEE LEG AMPUTATION Right 12/04/2016  . AMPUTATION Left 06/25/2016   Procedure: AMPUTATION ABOVE KNEE;  Surgeon: Nada Libman, MD;  Location: [facility] OR;  Service:  Vascular;  Laterality: Left;  . AMPUTATION Right 12/04/2016   Procedure: RIGHT AMPUTATION ABOVE KNEE;  Surgeon: Maeola Harman, MD;  Location: [facility] OR;  Service: Vascular;  Laterality: Right;  . AMPUTATION Left 10/08/2017   Procedure: LEFT ABOVE KNEE AMPUTATION REVISION;  Surgeon: Maeola Harman, MD;  Location: [facility] OR;  Service: Vascular;  Laterality: Left;  . BYPASS GRAFT POPLITEAL TO TIBIAL Left 06/21/2016   Procedure: BYPASS GRAFT POPLITEAL TO TIBIAL USING GORE PROPATEN GRAFT;  Surgeon: Maeola Harman, MD;  Location: [facility] OR;  Service: Vascular;  Laterality: Left;  . EMBOLECTOMY Left 06/21/2016   Procedure: EMBOLECTOMY left lower extremity.;  Surgeon: Maeola Harman, MD;  Location: [facility] OR;  Service: Vascular;  Laterality: Left;  . ENDARTERECTOMY FEMORAL Left 02/16/2016   Procedure: LEFT FEMORAL ENDARTERECTOMY;  Surgeon: Maeola Harman, MD;  Location: [facility] OR;  Service: Vascular;  Laterality: Left;  . FEMORAL BYPASS  02/16/2016   LEFT FEMORAL-POSTERIOR TIBIAL ARTERY BYPASS  WITH PROPATEN 6 MM X 80 CM VASCULAR RING GRAFT (Left)  . FEMORAL-TIBIAL BYPASS GRAFT Left 02/16/2016   Procedure: LEFT FEMORAL-POSTERIOR TIBIAL ARTERY BYPASS  WITH PROPATEN 6 MM X 80 CM VASCULAR RING GRAFT;  Surgeon: Maeola Harman, MD;  Location: [facility] OR;  Service: Vascular;  Laterality: Left;  . FRACTURE SURGERY    . I&D EXTREMITY Left 09/03/2016   Procedure: IRRIGATION AND DEBRIDEMENT EXTREMITY -  LEFT AKA;  Surgeon: Maeola Harman, MD;  Location: [facility] OR;  Service: Vascular;  Laterality: Left;  . PATCH ANGIOPLASTY Left 02/16/2016   Procedure: PATCH ANGIOPLASTY WITH Livia Snellen BIOLOGIC PATCH 1 CM X 6 CM;  Surgeon: Maeola Harman, MD;  Location: [facility] OR;  Service: Vascular;  Laterality: Left;  . PERIPHERAL VASCULAR BALLOON ANGIOPLASTY Right 08/02/2016   Procedure: Peripheral Vascular Balloon Angioplasty;  Surgeon: Maeola Harman, MD;  Location: [facility] INVASIVE  CV LAB;  Service: Cardiovascular;  Laterality: Right;  peroneal artery  . PERIPHERAL VASCULAR CATHETERIZATION N/A 07/07/2015   Procedure: Abdominal Aortogram w/Lower Extremity;  Surgeon: Fransisco Hertz, MD;  Location: [facility] INVASIVE CV LAB;  Service: Cardiovascular;  Laterality: N/A;  . PERIPHERAL VASCULAR CATHETERIZATION N/A 02/15/2016   Procedure: Abdominal Aortogram w/Lower Extremity;  Surgeon: Maeola Harman, MD;  Location: [facility] INVASIVE CV LAB;  Service: Cardiovascular;  Laterality: N/A;  . PERIPHERAL VASCULAR CATHETERIZATION Bilateral 06/20/2016   Procedure: Lower Extremity Angiography;  Surgeon: Maeola Harman, MD;  Location: [facility] INVASIVE CV LAB;  Service: Cardiovascular;  Laterality: Bilateral;  limited runoff on rt leg thrombolysis lt leg bypass graft  . PERIPHERAL VASCULAR INTERVENTION Right 08/02/2016   Procedure: Peripheral Vascular Intervention;  Surgeon: Maeola Harman, MD;  Location: [facility] INVASIVE CV LAB;  Service: Cardiovascular;  Laterality: Right;  Femoral , popliteal  . 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    Social History   Socioeconomic History  . Marital status: Single    Spouse name: Not on file  . Number of children: Not on file  . Years of education: Not on file  . Highest education level: Not on file  Occupational History  . Occupation: Unemployed  Social Needs  . Financial resource strain: Not on file  . Food insecurity:    Worry: Not on file    Inability: Not on file  . Transportation needs:    Medical: Not on file    Non-medical: Not on file  Tobacco Use  . Smoking status: Former Smoker    Packs/day: 3.00    Years: 40.00    Pack years: 120.00  . Smokeless tobacco: Never Used  . Tobacco comment: cutting back amount he is buying  Substance and Sexual Activity  . Alcohol use: Not Currently    Alcohol/week: 8.4 oz    Types: 14 Cans of beer per week    Comment: Drinks a 40 oz "tallboy"  daily   [id number]  I quitdrinking  . Drug use: No  . Sexual activity: Yes    Birth control/protection: Condom  Lifestyle  . Physical activity:    Days per week: Not on file    Minutes per session: Not on file  . Stress: Not on file  Relationships  . Social connections:    Talks on phone: Not on file    Gets together: Not on file    Attends religious service: Not on file    Active member of club or organization: Not on file    Attends meetings of clubs or organizations: Not on file    Relationship status: Not on file  . Intimate partner violence:    Fear of current or ex partner: Not on file    Emotionally abused: Not on file    Physically abused: Not on file    Forced sexual activity: Not on file  Other Topics Concern  . Not on file  Social History Narrative  Smoking cigarettes since 62 y.o   Drinks beer 24 oz every other day     No Known Allergies  Current Outpatient Medications on File Prior to Visit  Medication Sig Dispense Refill  . acetaminophen (TYLENOL) 325 MG tablet Take 2 tablets (650 mg total) by mouth every 6 (six) hours as needed for mild pain, fever or headache. 20 tablet 0  . Amino Acids-Protein Hydrolys (FEEDING SUPPLEMENT, PRO-STAT SUGAR FREE 64,) LIQD Take 30 mLs by mouth 2 (two) times daily. 1800 mL 0  . amLODipine (NORVASC) 10 MG tablet Take 1 tablet (10 mg total) by mouth daily.    Marland Kitchen aspirin EC 81 MG EC tablet Take 1 tablet (81 mg total) by mouth daily.    Marland Kitchen atorvastatin (LIPITOR) 40 MG tablet Take 1 tablet (40 mg total) by mouth daily at 6 PM. 30 tablet 0  . cholecalciferol (VITAMIN D) 1000 units tablet Take 1,000 Units by mouth daily.    . clopidogrel (PLAVIX) 75 MG tablet Take 1 tablet (75 mg total) by mouth daily.    . collagenase (SANTYL) ointment Apply topically daily. 15 g 0  . docusate sodium (COLACE) 100 MG capsule Take 1 capsule (100 mg total) by mouth daily. 10 capsule 0  . feeding supplement, ENSURE ENLIVE, (ENSURE ENLIVE) LIQD Take 237 mLs by  mouth 2 (two) times daily between meals. 14220 mL 0  . folic acid (FOLVITE) 1 MG tablet Take 1 tablet (1 mg total) by mouth daily.    . Lidocaine (ASPERCREME LIDOCAINE) 4 % PTCH Apply 1 patch topically daily. Apply to left shoulder    . metoprolol tartrate (LOPRESSOR) 25 MG tablet Take 1 tablet (25 mg total) by mouth 2 (two) times daily. (Patient taking differently: Take 25 mg by mouth 2 (two) times daily. Hold for SBP<100, pulse <60)    . mirtazapine (REMERON) 15 MG tablet Take 15 mg by mouth at bedtime.    . Multiple Vitamin (TAB-A-VITE PO) Take 1 tablet by mouth daily.    Marland Kitchen oxyCODONE-acetaminophen (PERCOCET/ROXICET) 5-325 MG tablet Take 1 tablet by mouth every 6 (six) hours as needed for severe pain. 10 tablet 0  . pantoprazole (PROTONIX) 20 MG tablet Take 20 mg by mouth daily as needed for heartburn or indigestion.     . polyethylene glycol (MIRALAX / GLYCOLAX) packet Take 17 g by mouth daily.    Marland Kitchen PROTEIN PO Take 30 mLs by mouth 3 (three) times daily.    Marland Kitchen senna-docusate (SENNA-PLUS) 8.6-50 MG tablet Take 2 tablets by mouth at bedtime.    Marland Kitchen spironolactone (ALDACTONE) 25 MG tablet Take 1 tablet (25 mg total) by mouth daily.    Marland Kitchen UNABLE TO FIND Take 60 mLs by mouth 3 (three) times daily. Med Name: Medpass 2.0     No current facility-administered medications on file prior to visit.      Physical Examination  Vitals:   12/03/17 1009  BP: 110/81  Pulse: 85  Resp: 16  Temp: (!) 97.4 F (36.3 C)  TempSrc: Oral  SpO2: 99%   There is no height or weight on file to calculate BMI.    Left AKA   Left AKA   Right femoral pulse is 2+ palpable, left is faintly palpable, tender to palpation of left groin, no erythema or wounds in left groin.  Left AKA wound tracks at least 6 cm proximally and anteriorly with a sterile cotton swab, may track further but too tender for pt to tolerate any further probing . Thick  pink/beige tinged liquid in moderate amount is draining, no odor.    Medical  Decision Making  Freddie ApleyJerome D Nessler is a 62 y.o. male who is s/p revision left above-knee amputation on 10-08-17 by Dr. 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  Pt has a previous right AKA.   I spoke with Dr. Arbie CookeyEarly re pt HPI and physical exam.  No antibx necessary at this time. Pt has no constitutional signs or symptoms of infection, no local signs of infection at the left AKA open wound.  Left AKA wound packed with dry 4x4 gauze, gauze dressing on top of this.  If wound continues to drain, then dry gauze packing bid at the nursing facility.  If wound stops draining, then daily damp to dry NS packing of the tracking.  Return in 2 weeks to see Dr. Randie Heinzain for any further determination how to address this wound.    Charisse MarchSuzanne Falen Lehrmann, RN, MSN, FNP-C [facility] [place] Office: [phone]  12/03/2017, 10:32 AM  Clinic MD: Early

## 2017-12-25 ENCOUNTER — Ambulatory Visit (INDEPENDENT_AMBULATORY_CARE_PROVIDER_SITE_OTHER): Payer: Self-pay | Admitting: Vascular Surgery

## 2017-12-25 ENCOUNTER — Encounter: Payer: Self-pay | Admitting: Vascular Surgery

## 2017-12-25 ENCOUNTER — Other Ambulatory Visit: Payer: Self-pay

## 2017-12-25 VITALS — BP 109/77 | HR 93 | Temp 98.3°F | Resp 18 | Ht 66.0 in | Wt 99.0 lb

## 2017-12-25 DIAGNOSIS — T8789 Other complications of amputation stump: Secondary | ICD-10-CM

## 2017-12-25 NOTE — Progress Notes (Signed)
  Subjective:     Patient ID: Stephen Bowen, male   DOB: 04-Oct-1955, 62 y.o.   MRN: [id number]  HPI 62 year old male follows up for wound check after left above-knee amputation.  States that he is getting wound care daily.  He has not had any fevers.  There is no erythema.   Review of Systems No complaints today.    Objective:   Physical Exam He is awake and alert at baseline mental status Left AKA site has 2 areas of punctate ulceration with granulation tissue laterally and in the center of the wound.    Assessment/plan     62 year old male follows up for wound check.  Wounds appear to be granulating well repacked today.  Continue packing at his nursing home.  He can follow-up in 4 to 6 weeks for repeat wound check at which point he can become a PRN follow-up.  Ryen Heitmeyer C. Randie Heinzain, MD [facility] [place] Office: [phone] Pager: [phone]

## 2018-01-09 ENCOUNTER — Observation Stay (HOSPITAL_COMMUNITY): Payer: Medicaid Other | Admitting: Anesthesiology

## 2018-01-09 ENCOUNTER — Encounter (HOSPITAL_COMMUNITY): Payer: Self-pay | Admitting: *Deleted

## 2018-01-09 ENCOUNTER — Encounter (HOSPITAL_COMMUNITY)
Admission: EM | Disposition: A | Discharge status: Skilled Nursing Facility | Payer: Self-pay | Source: Other Acute Inpatient Hospital | Attending: Internal Medicine

## 2018-01-09 ENCOUNTER — Inpatient Hospital Stay (HOSPITAL_COMMUNITY)
Admission: EM | Admit: 2018-01-09 | Discharge: 2018-01-14 | DRG: 252 | Disposition: A | Discharge status: Skilled Nursing Facility | Payer: Medicaid Other | Source: Other Acute Inpatient Hospital | Attending: Internal Medicine | Admitting: Internal Medicine

## 2018-01-09 DIAGNOSIS — R402240 Coma scale, best verbal response, confused conversation, unspecified time: Secondary | ICD-10-CM | POA: Diagnosis present

## 2018-01-09 DIAGNOSIS — Z9119 Patient's noncompliance with other medical treatment and regimen: Secondary | ICD-10-CM

## 2018-01-09 DIAGNOSIS — L7622 Postprocedural hemorrhage and hematoma of skin and subcutaneous tissue following other procedure: Secondary | ICD-10-CM | POA: Diagnosis not present

## 2018-01-09 DIAGNOSIS — B9562 Methicillin resistant Staphylococcus aureus infection as the cause of diseases classified elsewhere: Secondary | ICD-10-CM | POA: Diagnosis not present

## 2018-01-09 DIAGNOSIS — Z7982 Long term (current) use of aspirin: Secondary | ICD-10-CM

## 2018-01-09 DIAGNOSIS — Z89611 Acquired absence of right leg above knee: Secondary | ICD-10-CM

## 2018-01-09 DIAGNOSIS — Z95820 Peripheral vascular angioplasty status with implants and grafts: Secondary | ICD-10-CM

## 2018-01-09 DIAGNOSIS — T8789 Other complications of amputation stump: Secondary | ICD-10-CM | POA: Diagnosis present

## 2018-01-09 DIAGNOSIS — R58 Hemorrhage, not elsewhere classified: Secondary | ICD-10-CM | POA: Diagnosis not present

## 2018-01-09 DIAGNOSIS — I252 Old myocardial infarction: Secondary | ICD-10-CM

## 2018-01-09 DIAGNOSIS — Z87891 Personal history of nicotine dependence: Secondary | ICD-10-CM

## 2018-01-09 DIAGNOSIS — I70209 Unspecified atherosclerosis of native arteries of extremities, unspecified extremity: Secondary | ICD-10-CM | POA: Diagnosis present

## 2018-01-09 DIAGNOSIS — N179 Acute kidney failure, unspecified: Secondary | ICD-10-CM | POA: Diagnosis not present

## 2018-01-09 DIAGNOSIS — R578 Other shock: Secondary | ICD-10-CM | POA: Diagnosis not present

## 2018-01-09 DIAGNOSIS — E785 Hyperlipidemia, unspecified: Secondary | ICD-10-CM | POA: Diagnosis present

## 2018-01-09 DIAGNOSIS — Z681 Body mass index (BMI) 19 or less, adult: Secondary | ICD-10-CM | POA: Diagnosis not present

## 2018-01-09 DIAGNOSIS — Z89612 Acquired absence of left leg above knee: Secondary | ICD-10-CM | POA: Diagnosis not present

## 2018-01-09 DIAGNOSIS — E860 Dehydration: Secondary | ICD-10-CM | POA: Diagnosis not present

## 2018-01-09 DIAGNOSIS — Z7902 Long term (current) use of antithrombotics/antiplatelets: Secondary | ICD-10-CM

## 2018-01-09 DIAGNOSIS — E440 Moderate protein-calorie malnutrition: Secondary | ICD-10-CM | POA: Diagnosis present

## 2018-01-09 DIAGNOSIS — Y835 Amputation of limb(s) as the cause of abnormal reaction of the patient, or of later complication, without mention of misadventure at the time of the procedure: Secondary | ICD-10-CM | POA: Diagnosis not present

## 2018-01-09 DIAGNOSIS — I2510 Atherosclerotic heart disease of native coronary artery without angina pectoris: Secondary | ICD-10-CM | POA: Diagnosis not present

## 2018-01-09 DIAGNOSIS — R739 Hyperglycemia, unspecified: Secondary | ICD-10-CM | POA: Diagnosis not present

## 2018-01-09 DIAGNOSIS — R402140 Coma scale, eyes open, spontaneous, unspecified time: Secondary | ICD-10-CM | POA: Diagnosis present

## 2018-01-09 DIAGNOSIS — T8189XA Other complications of procedures, not elsewhere classified, initial encounter: Secondary | ICD-10-CM | POA: Diagnosis not present

## 2018-01-09 DIAGNOSIS — I10 Essential (primary) hypertension: Secondary | ICD-10-CM | POA: Diagnosis not present

## 2018-01-09 DIAGNOSIS — I9789 Other postprocedural complications and disorders of the circulatory system, not elsewhere classified: Secondary | ICD-10-CM | POA: Diagnosis not present

## 2018-01-09 DIAGNOSIS — Z86718 Personal history of other venous thrombosis and embolism: Secondary | ICD-10-CM

## 2018-01-09 DIAGNOSIS — R402360 Coma scale, best motor response, obeys commands, unspecified time: Secondary | ICD-10-CM | POA: Diagnosis present

## 2018-01-09 DIAGNOSIS — T827XXA Infection and inflammatory reaction due to other cardiac and vascular devices, implants and grafts, initial encounter: Principal | ICD-10-CM | POA: Diagnosis present

## 2018-01-09 DIAGNOSIS — T148XXA Other injury of unspecified body region, initial encounter: Secondary | ICD-10-CM | POA: Diagnosis present

## 2018-01-09 DIAGNOSIS — L089 Local infection of the skin and subcutaneous tissue, unspecified: Secondary | ICD-10-CM | POA: Diagnosis present

## 2018-01-09 DIAGNOSIS — Z79899 Other long term (current) drug therapy: Secondary | ICD-10-CM

## 2018-01-09 HISTORY — PX: PATCH ANGIOPLASTY: SHX6230

## 2018-01-09 HISTORY — PX: I & D EXTREMITY: SHX5045

## 2018-01-09 HISTORY — PX: VEIN HARVEST: SHX6363

## 2018-01-09 LAB — CBC WITH DIFFERENTIAL/PLATELET
Abs Immature Granulocytes: 0.8 10*3/uL — ABNORMAL HIGH (ref 0.0–0.1)
BASOS ABS: 0.1 10*3/uL (ref 0.0–0.1)
BASOS PCT: 0 %
EOS ABS: 0 10*3/uL (ref 0.0–0.7)
EOS PCT: 0 %
HEMATOCRIT: 38.4 % — AB (ref 39.0–52.0)
Hemoglobin: 12.8 g/dL — ABNORMAL LOW (ref 13.0–17.0)
IMMATURE GRANULOCYTES: 3 %
LYMPHS ABS: 1 10*3/uL (ref 0.7–4.0)
Lymphocytes Relative: 4 %
MCH: 28.4 pg (ref 26.0–34.0)
MCHC: 33.3 g/dL (ref 30.0–36.0)
MCV: 85.1 fL (ref 78.0–100.0)
Monocytes Absolute: 1.3 10*3/uL — ABNORMAL HIGH (ref 0.1–1.0)
Monocytes Relative: 5 %
NEUTROS PCT: 88 %
Neutro Abs: 21.2 10*3/uL — ABNORMAL HIGH (ref 1.7–7.7)
PLATELETS: 631 10*3/uL — AB (ref 150–400)
RBC: 4.51 MIL/uL (ref 4.22–5.81)
RDW: 16.8 % — ABNORMAL HIGH (ref 11.5–15.5)
WBC: 24.3 10*3/uL — AB (ref 4.0–10.5)

## 2018-01-09 LAB — PROTIME-INR
INR: 1.07
PROTHROMBIN TIME: 13.8 s (ref 11.4–15.2)

## 2018-01-09 LAB — PREPARE RBC (CROSSMATCH)

## 2018-01-09 LAB — POCT I-STAT 7, (LYTES, BLD GAS, ICA,H+H)
Acid-base deficit: 1 mmol/L (ref 0.0–2.0)
Bicarbonate: 22.1 mmol/L (ref 20.0–28.0)
CALCIUM ION: 1.05 mmol/L — AB (ref 1.15–1.40)
HEMATOCRIT: 32 % — AB (ref 39.0–52.0)
Hemoglobin: 10.9 g/dL — ABNORMAL LOW (ref 13.0–17.0)
O2 Saturation: 100 %
PCO2 ART: 32.8 mmHg (ref 32.0–48.0)
Potassium: 4.8 mmol/L (ref 3.5–5.1)
Sodium: 133 mmol/L — ABNORMAL LOW (ref 135–145)
TCO2: 23 mmol/L (ref 22–32)
pH, Arterial: 7.439 (ref 7.350–7.450)
pO2, Arterial: 355 mmHg — ABNORMAL HIGH (ref 83.0–108.0)

## 2018-01-09 LAB — BASIC METABOLIC PANEL
Anion gap: 17 — ABNORMAL HIGH (ref 5–15)
BUN: 32 mg/dL — ABNORMAL HIGH (ref 8–23)
CO2: 18 mmol/L — ABNORMAL LOW (ref 22–32)
CREATININE: 1.12 mg/dL (ref 0.61–1.24)
Calcium: 8.6 mg/dL — ABNORMAL LOW (ref 8.9–10.3)
Chloride: 97 mmol/L — ABNORMAL LOW (ref 98–111)
Glucose, Bld: 112 mg/dL — ABNORMAL HIGH (ref 70–99)
Potassium: 5.6 mmol/L — ABNORMAL HIGH (ref 3.5–5.1)
SODIUM: 132 mmol/L — AB (ref 135–145)

## 2018-01-09 LAB — PREALBUMIN: Prealbumin: 16.8 mg/dL — ABNORMAL LOW (ref 18–38)

## 2018-01-09 LAB — GLUCOSE, CAPILLARY: Glucose-Capillary: 119 mg/dL — ABNORMAL HIGH (ref 70–99)

## 2018-01-09 LAB — C-REACTIVE PROTEIN: CRP: 19.8 mg/dL — ABNORMAL HIGH (ref <1.0)

## 2018-01-09 LAB — HEMOGLOBIN A1C
HEMOGLOBIN A1C: 6 % — AB (ref 4.8–5.6)
MEAN PLASMA GLUCOSE: 125.5 mg/dL

## 2018-01-09 LAB — MRSA PCR SCREENING: MRSA BY PCR: POSITIVE — AB

## 2018-01-09 LAB — SEDIMENTATION RATE: SED RATE: 54 mm/h — AB (ref 0–16)

## 2018-01-09 SURGERY — IRRIGATION AND DEBRIDEMENT EXTREMITY
Anesthesia: General | Site: Groin | Laterality: Left

## 2018-01-09 MED ORDER — OXYCODONE HCL 5 MG/5ML PO SOLN: 5.0000 mg | Freq: Once | ORAL | Status: DC | PRN

## 2018-01-09 MED ORDER — HEPARIN SODIUM (PORCINE) 1000 UNIT/ML IJ SOLN
INTRAMUSCULAR | Status: DC | PRN
  Administered 2018-01-09: 4000 [IU] via INTRAVENOUS

## 2018-01-09 MED ORDER — AMLODIPINE BESYLATE 10 MG PO TABS
10.0000 mg | ORAL_TABLET | Freq: Every day | ORAL | Status: DC
  Administered 2018-01-10: 10 mg via ORAL
  Filled 2018-01-09: qty 1

## 2018-01-09 MED ORDER — MIRTAZAPINE 15 MG PO TABS
15.0000 mg | ORAL_TABLET | Freq: Every day | ORAL | Status: DC
  Administered 2018-01-09 – 2018-01-14 (×6): 15 mg via ORAL
  Filled 2018-01-09 (×7): qty 1

## 2018-01-09 MED ORDER — SODIUM CHLORIDE 0.9% FLUSH
3.0000 mL | Freq: Two times a day (BID) | INTRAVENOUS | Status: DC
  Administered 2018-01-10 – 2018-01-14 (×4): 3 mL via INTRAVENOUS

## 2018-01-09 MED ORDER — SODIUM CHLORIDE 0.9 % IV SOLN: 10.0000 mL/h | Freq: Once | INTRAVENOUS | Status: DC

## 2018-01-09 MED ORDER — FENTANYL CITRATE (PF) 100 MCG/2ML IJ SOLN
INTRAMUSCULAR | Status: AC
  Administered 2018-01-09: 18:00:00
  Filled 2018-01-09: qty 2

## 2018-01-09 MED ORDER — SODIUM CHLORIDE 0.9 % IV SOLN
INTRAVENOUS | Status: DC | PRN
  Administered 2018-01-09: 35 ug/min via INTRAVENOUS

## 2018-01-09 MED ORDER — METOPROLOL TARTRATE 25 MG PO TABS
25.0000 mg | ORAL_TABLET | Freq: Two times a day (BID) | ORAL | Status: DC
  Administered 2018-01-09 – 2018-01-10 (×3): 25 mg via ORAL
  Filled 2018-01-09 (×2): qty 1
  Filled 2018-01-09: qty 2
  Filled 2018-01-09: qty 1

## 2018-01-09 MED ORDER — HYDROMORPHONE HCL 1 MG/ML IJ SOLN: 0.2500 mg | INTRAMUSCULAR | Status: DC | PRN

## 2018-01-09 MED ORDER — MIDAZOLAM HCL 5 MG/5ML IJ SOLN
INTRAMUSCULAR | Status: DC | PRN
  Administered 2018-01-09: 0.5 mg via INTRAVENOUS

## 2018-01-09 MED ORDER — DEXAMETHASONE SODIUM PHOSPHATE 10 MG/ML IJ SOLN
INTRAMUSCULAR | Status: DC | PRN
  Administered 2018-01-09: 10 mg via INTRAVENOUS

## 2018-01-09 MED ORDER — SODIUM CHLORIDE 0.9 % IV SOLN
INTRAVENOUS | Status: AC
  Filled 2018-01-09: qty 1.2

## 2018-01-09 MED ORDER — ATORVASTATIN CALCIUM 40 MG PO TABS
40.0000 mg | ORAL_TABLET | Freq: Every day | ORAL | Status: DC
  Administered 2018-01-10 – 2018-01-14 (×5): 40 mg via ORAL
  Filled 2018-01-09 (×6): qty 1

## 2018-01-09 MED ORDER — SODIUM CHLORIDE 0.9 % IV SOLN
INTRAVENOUS | Status: DC | PRN
  Administered 2018-01-09: 15:00:00

## 2018-01-09 MED ORDER — PROPOFOL 10 MG/ML IV BOLUS
INTRAVENOUS | Status: AC
Start: 2018-01-09 — End: ?
  Filled 2018-01-09: qty 20

## 2018-01-09 MED ORDER — ONDANSETRON HCL 4 MG/2ML IJ SOLN: 4.0000 mg | Freq: Once | INTRAMUSCULAR | Status: DC | PRN

## 2018-01-09 MED ORDER — ROCURONIUM BROMIDE 10 MG/ML (PF) SYRINGE
PREFILLED_SYRINGE | INTRAVENOUS | Status: DC | PRN
  Administered 2018-01-09: 50 mg via INTRAVENOUS
  Administered 2018-01-09: 20 mg via INTRAVENOUS

## 2018-01-09 MED ORDER — SENNOSIDES-DOCUSATE SODIUM 8.6-50 MG PO TABS
2.0000 | ORAL_TABLET | Freq: Every day | ORAL | Status: DC
Start: 2018-01-09 — End: 2018-01-15
  Administered 2018-01-09 – 2018-01-14 (×6): 2 via ORAL
  Filled 2018-01-09 (×7): qty 2

## 2018-01-09 MED ORDER — LACTATED RINGERS IV SOLN
INTRAVENOUS | Status: DC
  Administered 2018-01-09: 10:00:00 via INTRAVENOUS

## 2018-01-09 MED ORDER — FENTANYL CITRATE (PF) 250 MCG/5ML IJ SOLN
INTRAMUSCULAR | Status: DC | PRN
  Administered 2018-01-09: 100 ug via INTRAVENOUS
  Administered 2018-01-09: 50 ug via INTRAVENOUS
  Administered 2018-01-09: 100 ug via INTRAVENOUS

## 2018-01-09 MED ORDER — ONDANSETRON HCL 4 MG/2ML IJ SOLN
4.0000 mg | Freq: Four times a day (QID) | INTRAMUSCULAR | Status: DC | PRN
  Administered 2018-01-09: 4 mg via INTRAVENOUS

## 2018-01-09 MED ORDER — LACTATED RINGERS IV SOLN
INTRAVENOUS | Status: DC
  Administered 2018-01-09: 12:00:00 via INTRAVENOUS

## 2018-01-09 MED ORDER — SUGAMMADEX SODIUM 200 MG/2ML IV SOLN
INTRAVENOUS | Status: DC | PRN
  Administered 2018-01-09: 100 mg via INTRAVENOUS

## 2018-01-09 MED ORDER — CEFAZOLIN SODIUM-DEXTROSE 2-4 GM/100ML-% IV SOLN
INTRAVENOUS | Status: AC
  Filled 2018-01-09: qty 100

## 2018-01-09 MED ORDER — PANTOPRAZOLE SODIUM 20 MG PO TBEC
20.0000 mg | DELAYED_RELEASE_TABLET | Freq: Every day | ORAL | Status: DC | PRN
Start: 2018-01-09 — End: 2018-01-15
  Filled 2018-01-09: qty 1

## 2018-01-09 MED ORDER — CEFAZOLIN SODIUM-DEXTROSE 1-4 GM/50ML-% IV SOLN
1.0000 g | INTRAVENOUS | Status: DC
  Filled 2018-01-09: qty 50

## 2018-01-09 MED ORDER — FOLIC ACID 1 MG PO TABS
1.0000 mg | ORAL_TABLET | Freq: Every day | ORAL | Status: DC
  Administered 2018-01-10 – 2018-01-14 (×5): 1 mg via ORAL
  Filled 2018-01-09 (×5): qty 1

## 2018-01-09 MED ORDER — OXYCODONE HCL 5 MG PO TABS: 5.0000 mg | ORAL_TABLET | Freq: Once | ORAL | Status: DC | PRN

## 2018-01-09 MED ORDER — POLYETHYLENE GLYCOL 3350 17 G PO PACK
17.0000 g | PACK | Freq: Every day | ORAL | Status: DC
  Administered 2018-01-10 – 2018-01-14 (×5): 17 g via ORAL
  Filled 2018-01-09 (×5): qty 1

## 2018-01-09 MED ORDER — ACETAMINOPHEN 325 MG PO TABS: 650.0000 mg | ORAL_TABLET | Freq: Four times a day (QID) | ORAL | Status: DC | PRN

## 2018-01-09 MED ORDER — DOCUSATE SODIUM 100 MG PO CAPS
100.0000 mg | ORAL_CAPSULE | Freq: Two times a day (BID) | ORAL | Status: DC
  Administered 2018-01-09 – 2018-01-14 (×10): 100 mg via ORAL
  Filled 2018-01-09 (×12): qty 1

## 2018-01-09 MED ORDER — PROPOFOL 10 MG/ML IV BOLUS
INTRAVENOUS | Status: DC | PRN
  Administered 2018-01-09: 50 mg via INTRAVENOUS
  Administered 2018-01-09: 100 mg via INTRAVENOUS

## 2018-01-09 MED ORDER — LIDOCAINE 4 % EX PTCH: 1.0000 | MEDICATED_PATCH | Freq: Every day | CUTANEOUS | Status: DC

## 2018-01-09 MED ORDER — MIDAZOLAM HCL 2 MG/2ML IJ SOLN
INTRAMUSCULAR | Status: AC
  Filled 2018-01-09: qty 2

## 2018-01-09 MED ORDER — OXYCODONE-ACETAMINOPHEN 5-325 MG PO TABS
1.0000 | ORAL_TABLET | Freq: Four times a day (QID) | ORAL | Status: DC | PRN
  Administered 2018-01-09 – 2018-01-14 (×3): 1 via ORAL
  Filled 2018-01-09 (×3): qty 1

## 2018-01-09 MED ORDER — CEFAZOLIN SODIUM-DEXTROSE 2-3 GM-%(50ML) IV SOLR
INTRAVENOUS | Status: DC | PRN
  Administered 2018-01-09: 2 g via INTRAVENOUS

## 2018-01-09 MED ORDER — ONDANSETRON HCL 4 MG PO TABS: 4.0000 mg | ORAL_TABLET | Freq: Four times a day (QID) | ORAL | Status: DC | PRN

## 2018-01-09 MED ORDER — SODIUM CHLORIDE 0.9 % IV SOLN
INTRAVENOUS | Status: DC
  Administered 2018-01-09: 23:00:00 via INTRAVENOUS
  Administered 2018-01-10: 75 mL via INTRAVENOUS
  Administered 2018-01-11: 01:00:00 via INTRAVENOUS

## 2018-01-09 MED ORDER — 0.9 % SODIUM CHLORIDE (POUR BTL) OPTIME
TOPICAL | Status: DC | PRN
  Administered 2018-01-09: 1000 mL

## 2018-01-09 MED ORDER — ASPIRIN 81 MG PO CHEW
81.0000 mg | CHEWABLE_TABLET | Freq: Every day | ORAL | Status: DC
  Administered 2018-01-10 – 2018-01-14 (×5): 81 mg via ORAL
  Filled 2018-01-09 (×5): qty 1

## 2018-01-09 MED ORDER — PROTAMINE SULFATE 10 MG/ML IV SOLN
INTRAVENOUS | Status: DC | PRN
  Administered 2018-01-09: 40 mg via INTRAVENOUS
  Administered 2018-01-09: 10 mg via INTRAVENOUS

## 2018-01-09 MED ORDER — METOPROLOL TARTRATE 5 MG/5ML IV SOLN
INTRAVENOUS | Status: DC | PRN
  Administered 2018-01-09: 2 mg via INTRAVENOUS

## 2018-01-09 MED ORDER — ACETAMINOPHEN 650 MG RE SUPP: 650.0000 mg | Freq: Four times a day (QID) | RECTAL | Status: DC | PRN

## 2018-01-09 MED ORDER — PROMETHAZINE HCL 25 MG/ML IJ SOLN: 6.2500 mg | INTRAMUSCULAR | Status: DC | PRN

## 2018-01-09 MED ORDER — FENTANYL CITRATE (PF) 100 MCG/2ML IJ SOLN
25.0000 ug | INTRAMUSCULAR | Status: DC | PRN
  Administered 2018-01-09: 50 ug via INTRAVENOUS

## 2018-01-09 MED ORDER — FENTANYL CITRATE (PF) 250 MCG/5ML IJ SOLN
INTRAMUSCULAR | Status: AC
  Filled 2018-01-09: qty 5

## 2018-01-09 SURGICAL SUPPLY — 54 items
BANDAGE ACE 4X5 VEL STRL LF (GAUZE/BANDAGES/DRESSINGS) IMPLANT
BANDAGE ACE 6X5 VEL STRL LF (GAUZE/BANDAGES/DRESSINGS) IMPLANT
BLADE SURG 11 STRL SS (BLADE) ×3 IMPLANT
BNDG GAUZE ELAST 4 BULKY (GAUZE/BANDAGES/DRESSINGS) IMPLANT
CANISTER SUCT 3000ML PPV (MISCELLANEOUS) ×3 IMPLANT
CLIP VESOCCLUDE MED 6/CT (CLIP) ×3 IMPLANT
CLIP VESOCCLUDE SM WIDE 6/CT (CLIP) ×6 IMPLANT
COVER SURGICAL LIGHT HANDLE (MISCELLANEOUS) ×3 IMPLANT
DRAPE EXTREMITY T 121X128X90 (DRAPE) IMPLANT
DRAPE HALF SHEET 40X57 (DRAPES) IMPLANT
DRAPE INCISE IOBAN 66X45 STRL (DRAPES) IMPLANT
DRAPE ORTHO SPLIT 77X108 STRL (DRAPES)
DRAPE SURG ORHT 6 SPLT 77X108 (DRAPES) IMPLANT
DRSG ADAPTIC 3X8 NADH LF (GAUZE/BANDAGES/DRESSINGS) IMPLANT
DRSG VAC ATS MED SENSATRAC (GAUZE/BANDAGES/DRESSINGS) ×3 IMPLANT
ELECT REM PT RETURN 9FT ADLT (ELECTROSURGICAL) ×3
ELECTRODE REM PT RTRN 9FT ADLT (ELECTROSURGICAL) ×2 IMPLANT
GAUZE SPONGE 4X4 12PLY STRL LF (GAUZE/BANDAGES/DRESSINGS) ×3 IMPLANT
GLOVE BIO SURGEON STRL SZ 6.5 (GLOVE) ×6 IMPLANT
GLOVE BIO SURGEON STRL SZ7.5 (GLOVE) ×3 IMPLANT
GLOVE BIOGEL PI IND STRL 7.0 (GLOVE) ×4 IMPLANT
GLOVE BIOGEL PI INDICATOR 7.0 (GLOVE) ×2
GLOVE ECLIPSE 6.5 STRL STRAW (GLOVE) ×3 IMPLANT
GOWN STRL REUS W/ TWL LRG LVL3 (GOWN DISPOSABLE) ×4 IMPLANT
GOWN STRL REUS W/ TWL XL LVL3 (GOWN DISPOSABLE) ×2 IMPLANT
GOWN STRL REUS W/TWL LRG LVL3 (GOWN DISPOSABLE) ×2
GOWN STRL REUS W/TWL XL LVL3 (GOWN DISPOSABLE) ×1
INSERT FOGARTY SM (MISCELLANEOUS) ×3 IMPLANT
KIT BASIN OR (CUSTOM PROCEDURE TRAY) ×3 IMPLANT
KIT TURNOVER KIT B (KITS) ×3 IMPLANT
LOOP VESSEL MAXI BLUE (MISCELLANEOUS) ×3 IMPLANT
LOOP VESSEL MINI RED (MISCELLANEOUS) ×3 IMPLANT
NS IRRIG 1000ML POUR BTL (IV SOLUTION) ×3 IMPLANT
PACK GENERAL/GYN (CUSTOM PROCEDURE TRAY) IMPLANT
PAD ABD 8X10 STRL (GAUZE/BANDAGES/DRESSINGS) ×3 IMPLANT
PAD ARMBOARD 7.5X6 YLW CONV (MISCELLANEOUS) ×6 IMPLANT
SPONGE LAP 18X18 X RAY DECT (DISPOSABLE) ×3 IMPLANT
SUT ETHILON 3 0 PS 1 (SUTURE) IMPLANT
SUT PROLENE 5 0 C 1 24 (SUTURE) ×12 IMPLANT
SUT PROLENE 6 0 BV (SUTURE) ×3 IMPLANT
SUT SILK 2 0 (SUTURE) ×1
SUT SILK 2-0 18XBRD TIE 12 (SUTURE) ×2 IMPLANT
SUT VIC AB 2-0 CT1 27 (SUTURE) ×2
SUT VIC AB 2-0 CT1 TAPERPNT 27 (SUTURE) ×4 IMPLANT
SUT VIC AB 3-0 SH 27 (SUTURE) ×1
SUT VIC AB 3-0 SH 27X BRD (SUTURE) ×2 IMPLANT
SUT VICRYL 4-0 PS2 18IN ABS (SUTURE) IMPLANT
SYR BULB IRRIGATION 50ML (SYRINGE) ×3 IMPLANT
SYRINGE 20CC LL (MISCELLANEOUS) ×3 IMPLANT
TAPE CLOTH SURG 4X10 WHT LF (GAUZE/BANDAGES/DRESSINGS) ×3 IMPLANT
TOWEL GREEN STERILE (TOWEL DISPOSABLE) ×6 IMPLANT
TOWEL GREEN STERILE FF (TOWEL DISPOSABLE) ×3 IMPLANT
WATER STERILE IRR 1000ML POUR (IV SOLUTION) ×3 IMPLANT
WND VAC CANISTER 500ML (MISCELLANEOUS) ×3 IMPLANT

## 2018-01-09 NOTE — Plan of Care (Addendum)
[facility] transfer. [name] is a 62 year old male with past medical history significant for HTN, HLD, CAD, PVD, s/p b/l amputation, and chronic left inguinal wound; who presented from SNF with complaints of bleeding from left inguinal wound.  Patient was initially noted to be pale and diaphoretic with HR 120-130s with blood pressure as low as 75/45.  Bleeding was able to be controlled and Surgicel was placed.  Central line access was obtained.   labs revealed hemoglobin 8, platelets 800, Cr 1.3, and INR 1.7.  Patient was typed and screened to be transfused 2 units of packed red blood cells.  Systolic blood pressures are noted to be in the 90s after initial therapies, but heart rate still in the 120s.  Chest x-ray was otherwise noted to be clear.  Reason for transfer includes possible need of specialty services.  Accepted to a stepdown bed.

## 2018-01-09 NOTE — Anesthesia Procedure Notes (Signed)
Arterial Line Insertion Start/End7/18/2019 3:05 PM, 01/09/2018 3:10 PM Performed by: Kipp BroodJoslin, Emilyanne Mcgough, MD, anesthesiologist  Preanesthetic checklist: patient identified and site marked Right, brachial was placed Hand hygiene performed  and maximum sterile barriers used   Attempts: 1 Procedure performed without using ultrasound guided technique. Following insertion, Biopatch and dressing applied. Patient tolerated the procedure well with no immediate complications.

## 2018-01-09 NOTE — Progress Notes (Addendum)
[facility]. VS stable. Pt resting comfortably in the room. [facility]. [facility] paged.

## 2018-01-09 NOTE — Anesthesia Procedure Notes (Signed)
Procedure Name: Intubation Date/Time: 01/09/2018 2:56 PM Performed by: Marena ChancyBeckner, Markella Dao S, CRNA Pre-anesthesia Checklist: Patient identified, Emergency Drugs available, Suction available, Patient being monitored and Timeout performed Patient Re-evaluated:Patient Re-evaluated prior to induction Oxygen Delivery Method: Circle system utilized Preoxygenation: Pre-oxygenation with 100% oxygen Induction Type: IV induction Ventilation: Mask ventilation without difficulty Laryngoscope Size: Miller and 2 Grade View: Grade I Tube size: 7.5 mm Number of attempts: 1 Airway Equipment and Method: Stylet Placement Confirmation: ETT inserted through vocal cords under direct vision,  positive ETCO2 and breath sounds checked- equal and bilateral Secured at: 23 cm Tube secured with: Tape Dental Injury: Teeth and Oropharynx as per pre-operative assessment  Comments: Intubated by Huel CoteMarianna Furr, SRNA

## 2018-01-09 NOTE — Anesthesia Preprocedure Evaluation (Signed)
Anesthesia Evaluation  Patient identified by MRN, date of birth, ID band Patient confused    Reviewed: Allergy & Precautions, NPO status , Patient's Chart, lab work & pertinent test results  Airway Mallampati: II  TM Distance: >3 FB Neck ROM: Full    Dental  (+) Poor Dentition, Partial Lower, Partial Upper   Pulmonary former smoker,    breath sounds clear to auscultation       Cardiovascular hypertension,  Rhythm:Regular Rate:Normal     Neuro/Psych    GI/Hepatic   Endo/Other    Renal/GU      Musculoskeletal   Abdominal   Peds  Hematology   Anesthesia Other Findings   Reproductive/Obstetrics                             Anesthesia Physical Anesthesia Plan  ASA: IV and emergent  Anesthesia Plan: General   Post-op Pain Management:    Induction: Intravenous  PONV Risk Score and Plan: Ondansetron and Midazolam  Airway Management Planned: Oral ETT  Additional Equipment: Arterial line and CVP  Intra-op Plan:   Post-operative Plan: Extubation in OR  Informed Consent: I have reviewed the patients History and Physical, chart, labs and discussed the procedure including the risks, benefits and alternatives for the proposed anesthesia with the patient or authorized representative who has indicated his/her understanding and acceptance.   Dental advisory given  Plan Discussed with: CRNA and Anesthesiologist  Anesthesia Plan Comments:         Anesthesia Quick Evaluation

## 2018-01-09 NOTE — H&P (Signed)
History and Physical    Stephen Bowen:[id number] DOB: August 06, 1955 DOA: 01/09/2018  PCP: Shelbie Ammons, MD Consultants:  None Patient coming from: SNF; NOK: Uncle  Chief Complaint:  Transfer for hemorrhagic shock  HPI: Stephen Bowen is a 62 y.o. male with medical history significant of HTN; HLD; CAD; PVD s/p B amputation; and chronic left inguinal wound presenting from SNF with persistent bleeding from left inguinal wound.  The patient provided little history other than reporting bleeding from his left inguinal region.  I then spoke with the vascular surgery clinic, who knows him well.  The nurse reported that the patient has a poor social situation and that eventually 2 physicians had to sign off that the patient needed amputation.  He has had ongoing poor wound healing and he is continuing to have bleeding from his left AKA stump.  He also has a poorly healing chronic left inguinal wound.  While at SNF, he was found to be aggressively bleeding from this inguinal wound and was sent to the ER at [facility].  They were concerned about hemorrhagic shock and transfused 2 units of PRBC.  Due to concerns about possible need for vascular surgery assistance, he was transferred to [facility] overnight.  He had no specific concerns this AM.    ED Course:  Accepted in transfer from [facility] by Dr. Katrinka Blazing: Stephen Bowen is a 62 year old male with past medical history significant for HTN, HLD, CAD, PVD, s/p b/l amputation, and chronic left inguinal wound; who presented from SNF with complaints of bleeding from left inguinal wound. Patient was initially noted to be pale and diaphoretic with HR 120-130s with blood pressure as low as 75/45. Bleeding was able to be controlled and Surgicel was placed. Central line access was obtained. labs revealed hemoglobin 8, platelets 800, Cr 1.3, and INR 1.7. Patient was typed and screened to be transfused 2 units of packed red blood cells. Systolic blood pressures are noted to be  in the 90s after initial therapies, but heart rate still in the 120s. Chest x-ray was otherwise noted to be clear. Reason for transfer includes possible need of specialty services. Accepted to a stepdown bed.   Review of Systems: As per HPI; otherwise review of systems reviewed and negative.   Ambulatory Status:  S/p B AKA  Past Medical History:  Diagnosis Date  . Coronary artery disease   . Encephalopathy acute 07/2016  . HTN (hypertension)   . Hyperlipemia   . Myocardial infarction (HCC)   . Non-healing surgical wound    left AKA  . Noncompliance   . PAD (peripheral artery disease) (HCC)    a.  s/p left femoral to PT artery bypass with propaten on 02/16/16.    Past Surgical History:  Procedure Laterality Date  . ABDOMINAL AORTOGRAM W/LOWER EXTREMITY N/A 08/02/2016   Procedure: Abdominal Aortogram w/Lower Extremity;  Surgeon: Maeola Harman, MD;  Location: [facility] INVASIVE CV LAB;  Service: Cardiovascular;  Laterality: N/A;  . ABOVE KNEE LEG AMPUTATION Right 12/04/2016  . AMPUTATION Left 06/25/2016   Procedure: AMPUTATION ABOVE KNEE;  Surgeon: Nada Libman, MD;  Location: [facility] OR;  Service: Vascular;  Laterality: Left;  . AMPUTATION Right 12/04/2016   Procedure: RIGHT AMPUTATION ABOVE KNEE;  Surgeon: Maeola Harman, MD;  Location: [facility] OR;  Service: Vascular;  Laterality: Right;  . AMPUTATION Left 10/08/2017   Procedure: LEFT ABOVE KNEE AMPUTATION REVISION;  Surgeon: Maeola Harman, MD;  Location: [facility] OR;  Service: Vascular;  Laterality:  Left;  . BYPASS GRAFT POPLITEAL TO TIBIAL Left 06/21/2016   Procedure: BYPASS GRAFT POPLITEAL TO TIBIAL USING GORE PROPATEN GRAFT;  Surgeon: Maeola HarmanBrandon Christopher Cain, MD;  [facility];  Service: Vascular;  Laterality: Left;  . EMBOLECTOMY Left 06/21/2016   Procedure: EMBOLECTOMY left lower extremity.;  Surgeon: Maeola HarmanBrandon Christopher Cain, MD;  [facility];  Service: Vascular;  Laterality: Left;  . ENDARTERECTOMY FEMORAL  Left 02/16/2016   Procedure: LEFT FEMORAL ENDARTERECTOMY;  Surgeon: Maeola HarmanBrandon Christopher Cain, MD;  [facility];  Service: Vascular;  Laterality: Left;  . FEMORAL BYPASS  02/16/2016   LEFT FEMORAL-POSTERIOR TIBIAL ARTERY BYPASS  WITH PROPATEN 6 MM X 80 CM VASCULAR RING GRAFT (Left)  . FEMORAL-TIBIAL BYPASS GRAFT Left 02/16/2016   Procedure: LEFT FEMORAL-POSTERIOR TIBIAL ARTERY BYPASS  WITH PROPATEN 6 MM X 80 CM VASCULAR RING GRAFT;  Surgeon: Maeola HarmanBrandon Christopher Cain, MD;  [facility];  Service: Vascular;  Laterality: Left;  . FRACTURE SURGERY    . I&D EXTREMITY Left 09/03/2016   Procedure: IRRIGATION AND DEBRIDEMENT EXTREMITY - LEFT AKA;  Surgeon: Maeola HarmanBrandon Christopher Cain, MD;  [facility];  Service: Vascular;  Laterality: Left;  . PATCH ANGIOPLASTY Left 02/16/2016   Procedure: PATCH ANGIOPLASTY WITH Livia SnellenXENOSURE BIOLOGIC PATCH 1 CM X 6 CM;  Surgeon: Maeola HarmanBrandon Christopher Cain, MD;  [facility];  Service: Vascular;  Laterality: Left;  . PERIPHERAL VASCULAR BALLOON ANGIOPLASTY Right 08/02/2016   Procedure: Peripheral Vascular Balloon Angioplasty;  Surgeon: Maeola HarmanBrandon Christopher Cain, MD;  [facility];  Service: Cardiovascular;  Laterality: Right;  peroneal artery  . PERIPHERAL VASCULAR CATHETERIZATION N/A 07/07/2015   Procedure: Abdominal Aortogram w/Lower Extremity;  Surgeon: Fransisco HertzBrian L Chen, MD;  [facility];  Service: Cardiovascular;  Laterality: N/A;  . PERIPHERAL VASCULAR CATHETERIZATION N/A 02/15/2016   Procedure: Abdominal Aortogram w/Lower Extremity;  Surgeon: Maeola HarmanBrandon Christopher Cain, MD;  [facility];  Service: Cardiovascular;  Laterality: N/A;  . PERIPHERAL VASCULAR CATHETERIZATION Bilateral 06/20/2016   Procedure: Lower Extremity Angiography;  Surgeon: Maeola HarmanBrandon Christopher Cain, MD;  [facility];  Service: Cardiovascular;  Laterality: Bilateral;  limited runoff on rt leg thrombolysis lt leg bypass graft  . PERIPHERAL  VASCULAR INTERVENTION Right 08/02/2016   Procedure: Peripheral Vascular Intervention;  Surgeon: Maeola HarmanBrandon Christopher Cain, MD;  [facility];  Service: Cardiovascular;  Laterality: Right;  Femoral , popliteal  . 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    Social History   Socioeconomic History  . Marital status: Single    Spouse name: Not on file  . Number of children: Not on file  . Years of education: Not on file  . Highest education level: Not on file  Occupational History  . Occupation: Unemployed  Social Needs  . Financial resource strain: Not on file  . Food insecurity:    Worry: Not on file    Inability: Not on file  . Transportation needs:    Medical: Not on file    Non-medical: Not on file  Tobacco Use  . Smoking status: Former Smoker    Packs/day: 3.00    Years: 40.00    Pack years: 120.00  . Smokeless tobacco: Never Used  . Tobacco comment: cutting back amount he is buying  Substance and Sexual Activity  . Alcohol use: Not Currently    Alcohol/week: 8.4 oz    Types: 14 Cans of beer per week  Comment: Drinks a 40 oz "tallboy" daily   [id number]  I quitdrinking  . Drug use: No  . Sexual activity: Yes    Birth control/protection: Condom  Lifestyle  . Physical activity:    Days per week: Not on file    Minutes per session: Not on file  . Stress: Not on file  Relationships  . Social connections:    Talks on phone: Not on file    Gets together: Not on file    Attends religious service: Not on file    Active member of club or organization: Not on file    Attends meetings of clubs or organizations: Not on file    Relationship status: Not on file  . Intimate partner violence:    Fear of current or ex partner: Not on file    Emotionally abused: Not on file    Physically abused: Not on file    Forced sexual activity: Not on file  Other Topics Concern  . Not on file  Social History Narrative   Smoking  cigarettes since 62 y.o   Drinks beer 24 oz every other day     No Known Allergies  Family History  Problem Relation Age of Onset  . Cancer Mother        deceased in 3s   . Heart attack Mother        diseased  . Heart disease Father   . Stroke Sister        age 46s  . Heart disease Brother        pacemaker in his 9s    Prior to Admission medications   Medication Sig Start Date End Date Taking? Authorizing Provider  acetaminophen (TYLENOL) 325 MG tablet Take 2 tablets (650 mg total) by mouth every 6 (six) hours as needed for mild pain, fever or headache. 11/12/17   Arrien, York Ram, MD  amLODipine (NORVASC) 10 MG tablet Take 1 tablet (10 mg total) by mouth daily. 07/07/16   Rhyne, Ames Coupe, PA-C  aspirin EC 81 MG EC tablet Take 1 tablet (81 mg total) by mouth daily. 07/07/16   Rhyne, Ames Coupe, PA-C  atorvastatin (LIPITOR) 40 MG tablet Take 1 tablet (40 mg total) by mouth daily at 6 PM. 02/18/16   Kirby Crigler, Mir Shirline Frees, MD  cholecalciferol (VITAMIN D) 1000 units tablet Take 1,000 Units by mouth daily.    [provider]  clopidogrel (PLAVIX) 75 MG tablet Take 1 tablet (75 mg total) by mouth daily. 07/07/16   Rhyne, Ames Coupe, PA-C  collagenase (SANTYL) ointment Apply topically daily. 11/12/17   Arrien, York Ram, MD  docusate sodium (COLACE) 100 MG capsule Take 1 capsule (100 mg total) by mouth daily. 02/18/16   Kirby Crigler, Mir Shirline Frees, MD  folic acid (FOLVITE) 1 MG tablet Take 1 tablet (1 mg total) by mouth daily. 07/07/16   Rhyne, Ames Coupe, PA-C  Lidocaine (ASPERCREME LIDOCAINE) 4 % PTCH Apply 1 patch topically daily. Apply to left shoulder    [provider]  metoprolol tartrate (LOPRESSOR) 25 MG tablet Take 1 tablet (25 mg total) by mouth 2 (two) times daily. Patient taking differently: Take 25 mg by mouth 2 (two) times daily. Hold for SBP<100, pulse <60 10/10/17   Rhyne, Samantha J, PA-C  mirtazapine (REMERON) 15 MG tablet Take 15 mg by mouth at  bedtime.    [provider]  Multiple Vitamin (TAB-A-VITE PO) Take 1 tablet by mouth daily.    [provider]  oxyCODONE-acetaminophen (  PERCOCET/ROXICET) 5-325 MG tablet Take 1 tablet by mouth every 6 (six) hours as needed for severe pain. 11/12/17   Arrien, York Ram, MD  pantoprazole (PROTONIX) 20 MG tablet Take 20 mg by mouth daily as needed for heartburn or indigestion.     [provider]  polyethylene glycol (MIRALAX / GLYCOLAX) packet Take 17 g by mouth daily.    [provider]  PROTEIN PO Take 30 mLs by mouth 3 (three) times daily.    [provider]  senna-docusate (SENNA-PLUS) 8.6-50 MG tablet Take 2 tablets by mouth at bedtime.    [provider]  spironolactone (ALDACTONE) 25 MG tablet Take 1 tablet (25 mg total) by mouth daily. 07/07/16   Rhyne, Samantha J, PA-C  UNABLE TO FIND Take 60 mLs by mouth 3 (three) times daily. Med Name: Medpass 2.0    [provider]    Physical Exam: Vitals:   01/09/18 0700 01/09/18 0737 01/09/18 0800  BP: (!) 124/101  111/82  Pulse: (!) 121  (!) 107  Resp: 17  15  Temp:  97.9 F (36.6 C)   TempSrc:  Oral   SpO2: 99%  100%     General:  Appears calm and comfortable and is NAD Eyes:   EOMI, normal lids, iris ENT:  grossly normal hearing, lips & tongue, mmm; he is very soft spoken Neck:  no LAD, masses or thyromegaly; no carotid bruits Cardiovascular:  RRR, no m/r/g. No LE edema.  Respiratory:   CTA bilaterally with no wheezes/rales/rhonchi.  Normal respiratory effort. Abdomen:  soft, NT, ND, NABS Skin: small left inguinal wound with surgicel in place, hemostatic; his left AKA stump has a deep 1-2 cm ulceration with clots and intermittent bleeding Musculoskeletal: s/p B AKA Psychiatric: flat mood and affect, speech fluent and appropriate but quiet, AOx1-2   Radiological Exams on Admission: No results found.  EKG: not done   Labs on Admission: I have personally  reviewed the available labs and imaging studies at the time of the admission.  Pertinent labs from [place] [facility]:   WBC 25.1; 15.1 on 5/21 Hgb 8.1; 10.9 on 5/21 Plt 813; 637 on 5/21 Na++ 131 BUN 32/Creatinine 1.3 -> 1.12; 9/0.64/>60 on 5/21 Glucose 266 CRP today 19.8 A1c 6.0   Assessment/Plan Principal Problem:   Hemorrhage from wound Active Problems:   Essential hypertension   HLD (hyperlipidemia)   Non-healing wound of amputation stump (HCC)   Acute renal failure (ARF) (HCC)   Hemorrhage from groin wound -Patient presented with RH from SNF with concern for exsanguination from chronic left groin wound -Hemorrhage was abated with Surgicel -This wound is hemostatic at this time and Surgicel is still in place -There was apparent concern for hemorrhagic shock and the patient was transfused 2 units PRBC prior to arrival -Since arrival at [facility], he appears to be hemodynamically stable -He was initially placed in observation in SDU, but will transfer to telemetry at this time -Continue ASA, but will hold Plavix for now  Non-healing wound from amputation stump -While his groin wound appears to no longer be bleeding, his left AKA stump has an ulceration that is continuing to bleed intermittently -He is well known to the vascular surgery service, specifically to Dr. Randie Heinz, and I have placed a consult for him to see the patient today -If there is nothing further that needs to be done, the patient may be appropriate to return to his facility tomorrow  ARF -Baseline creatinine is 0.6-0.7.   -Today's creatinine is  1.12.  -Likely due to prerenal failure secondary to dehydration in the setting of excessive blood loss. -Hold diuretics (Aldactone) for now -IVF at 75 cc/hour -Follow up renal function by BMP -Avoid ACEI and NSAIDs  HTN -Continue home Norvasc and Lorpressor  HLD -Continue Lipitor 40 mg  Hyperglycemia -A1c is 6, indicating appropriate control without medication -Will not  order accuchecks and SSI at this time    DVT prophylaxis: None Code Status:  Full  Family Communication: None present  Disposition Plan:  Back to SNF once clinically improved Consults called: Vascular surgery; Nutrition, SW Admission status: It is my clinical opinion that referral for OBSERVATION is reasonable and necessary in this patient based on the above information provided. The aforementioned taken together are felt to place the patient at high risk for further clinical deterioration. However it is anticipated that the patient may be medically stable for discharge from the hospital within 24 to 48 hours.    Jonah Blue MD [facility]  If note is complete, please contact covering daytime or nighttime physician. [email or web address] Password [email or web address]  01/09/2018, 11:08 AM

## 2018-01-09 NOTE — Consult Note (Addendum)
WOC Nurse wound consult note Reason for Consult: Consult requested for left groin and left stump wounds.  Pt has been followed recently by Dr Randie Heinzain of the vascular team on 7/3, according to the EMR.. Pt began to have excessive bleeding from both wounds at an outside facility. Full thickness chronic wounds to left stump and left groin. Both locations with old clotted blood, no new bleeding at present. Measurement: Left stump .5X.5X.5cm, removed loose clotted blood, revealing red moist wound bed. No odor, small amt pink drainage.  Left groin with previous Surgicel removed, 1X1.2cm area of clotted blood in place; unable to visualize the wound bed or depth.  Left the hematoma in place and undisturbed to avoid having bleeding re-occur.  Small amt pink drainage, no odor or fluctuance. Periwound: Intact skin surrounding the wounds. Dressing procedure/placement/frequency: Moist gauze 2X2 applied to locations and covered with gauze and tape.   If bleeding re-occurs, please consult vascular team for further recommendations. Please re-consult if further assistance is needed.  Thank-you,  Cammie Mcgeeawn Shoshanna Mcquitty MSN, RN, CWOCN, [place]CN-AP, CNS [phone]

## 2018-01-09 NOTE — Progress Notes (Signed)
CSW acknowledges consult for accessing medications for pt once ready for discharge. For further assistance with this matter please consult RNCM. There are no further CSW needs at this time. CSW will sign off. If new need arises please reconsult.   Claude MangesKierra S. Yailyn Strack, MSW, LCSW-A Emergency Department Clinical Social Worker [phone]

## 2018-01-09 NOTE — Progress Notes (Signed)
Initial Nutrition Assessment  DOCUMENTATION CODES:   Non-severe (moderate) malnutrition in context of chronic illness  INTERVENTION:    When diet advanced after surgery today, add Ensure Enlive po BID, each supplement provides 350 kcal and 20 grams of protein  Multivitamin daily  NUTRITION DIAGNOSIS:   Moderate Malnutrition related to chronic illness(PAD, CAD, noncompliance) as evidenced by mild fat depletion, mild muscle depletion, moderate muscle depletion.  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  Patient not very communicative during RD visit this morning. He reports poor intake recently.  Discussed patient in ICU rounds and with RN today. Plans for exploratory surgery to groin wound today. Left leg amputation (from 2 weeks ago) site is leaking.    Labs reviewed. Sodium 132 (L), potassium 5.6 (H), BUN 32 (H), Prealbumin 16.8 (L) CBG's: 119 this morning Medications reviewed and include Colace, folic acid, Remeron, Miralax, Senokot-S.  NUTRITION - FOCUSED PHYSICAL EXAM:    Most Recent Value  Orbital Region  Mild depletion  Upper Arm Region  Mild depletion  Thoracic and Lumbar Region  Unable to assess  Buccal Region  Mild depletion  Temple Region  Moderate depletion  Clavicle Bone Region  Mild depletion  Clavicle and Acromion Bone Region  Mild depletion  Scapular Bone Region  Unable to assess  Dorsal Hand  Moderate depletion  Patellar Region  Unable to assess  Anterior Thigh Region  Unable to assess  Posterior Calf Region  Unable to assess  Edema (RD Assessment)  Unable to  assess  Hair  Reviewed  Eyes  Reviewed  Mouth  Reviewed  Skin  Reviewed (appears very pale)  Nails  Reviewed       Diet Order:   Diet Order           Diet NPO time specified Except for: Sips with Meds  Diet effective now          EDUCATION NEEDS:   No education needs have been identified at this time  Skin:  Skin Assessment: Skin Integrity Issues: Skin Integrity Issues:: Other (Comment), Incisions Incisions: L AKA site leaking fluid Other: L thigh/groin wound -- bleeding  Last BM:  unknown  Height:   Ht Readings from Last 1 Encounters:  12/25/17 5\' 6"  (1.676 m)    Weight:   Wt Readings from Last 1 Encounters:  12/25/17 99 lb (44.9 kg)    Ideal Body Weight:  54.2 kg  BMI:  19 (adjusted for bilateral AKA)  Estimated Nutritional Needs:   Kcal:  1600-1800  Protein:  80-100 gm  Fluid:  >/=1.6 L    Joaquin CourtsKimberly Harris, RD, LDN, CNSC Pager [phone] After Hours Pager [phone]

## 2018-01-09 NOTE — Transfer of Care (Signed)
Immediate Anesthesia Transfer of Care Note  Patient: Stephen Bowen  Procedure(s) Performed: IRRIGATION AND DEBRIDEMENT GROIN (Left ) PATCH ANGIOPLASTY OF LEFT COMMON FEMORAL AND PROFUNDA  USING SAPHENOUS VEIN (Left Groin) LEFT SAPHENOUS VEIN HARVEST (Left Groin)  Patient Location: PACU  Anesthesia Type:General  Level of Consciousness: awake, alert  and oriented  Airway & Oxygen Therapy: Patient Spontanous Breathing and Patient connected to nasal cannula oxygen  Post-op Assessment: Report given to RN and Post -op Vital signs reviewed and stable  Post vital signs: Reviewed and stable  Last Vitals:  Vitals Value Taken Time  BP 119/90 01/09/2018  5:05 PM  Temp 36.3 C 01/09/2018  5:03 PM  Pulse 108 01/09/2018  5:16 PM  Resp 13 01/09/2018  5:16 PM  SpO2 100 % 01/09/2018  5:16 PM  Vitals shown include unvalidated device data.  Last Pain:  Vitals:   01/09/18 1126  TempSrc: Oral         Complications: No apparent anesthesia complications

## 2018-01-09 NOTE — Clinical Social Work Note (Signed)
Clinical Social Work Assessment  Patient Details  Name: Stephen ApleyJerome D Bowen MRN: [id number] Date of Birth: [birth year]  Date of referral:  01/09/18               Reason for consult:  Facility Placement                Permission sought to share information with:  Family Supports Permission granted to share information::  Yes, Release of Information Signed  Name::     Stephen Bowen   Agency::  family  Relationship::  brother  Contact Information:  Stephen Bowen [phone]  Housing/Transportation Living arrangements for the past 2 months:  Skilled Nursing Facility( [facility] ) Source of Information:  Siblings(brother) Patient Interpreter Needed:  None Criminal Activity/Legal Involvement Pertinent to Current Situation/Hospitalization:  No - Comment as needed Significant Relationships:  Siblings Lives with:  Facility Resident Do you feel safe going back to the place where you live?  Yes Need for family participation in patient care:  Yes (Comment)  Care giving concerns:  CSW consulted for possible nursing home placement. CSW aware that pt is in surgery today therefore CSW spoke with pt's brother Stephen SorrowJerry for this assessment. CSW was informed that at this time family is concerned with pt being so far away. Brother expressed that he wants to see pt more and is only able to see pt once a month as brother nor sister drives.    Social Worker assessment / plan:  CSW spoke with pt's brother Stephen SorrowJerry for thsi assessment. CS[facility] was [facility] [address] Brother reports that family is wanting [place] to be placed closer to the [place] as they are having trouble seeing pt. CSW was informed that supports for pt included brother and sister, no other supports have been identified at this time.   During this assessment pt's brother was calm and answered questions as asked.   Employment status:  Other Theatre manager(Comment) Insurance information:  [place]  Recommendations:  Not assessed at this time Information / Referral to community resources:  Skilled Nursing Facility(spoke with brother abou tpossible placement needs at the time of discharge. )  Patient/Family's Response to care:  [name] appeared to be understanding and agreeable to plan of care at this time.   Patient/Family's Understanding of and Emotional Response to Diagnosis, Current Treatment, and Prognosis:  No further questions or concerns have been presented to CSW at this time. Emotional response appeared to be calm and understanding of pt's needs at this time.   Emotional Assessment Appearance:  Appears stated age Attitude/Demeanor/Rapport:  Unable to Assess(pt in surgery. ) Affect (typically observed):  Unable to Assess Orientation:  (unable to assess. ) Alcohol / Substance use:  Not Applicable Psych involvement (Current and /or in the community):  No (Comment)  Discharge Needs  Concerns to be addressed:  Care Coordination Readmission within the last 30 days:  No Current discharge risk:  Dependent with Mobility Barriers to Discharge:  Continued Medical Work up   Sempra EnergyKierra S Yazir Koerber, LCSWA 01/09/2018, 12:17 PM

## 2018-01-09 NOTE — Op Note (Signed)
Patient name: Stephen ApleyJerome D Borkowski MRN: [id number] DOB: Nov 26, 1955 Sex: male  01/09/2018 Pre-operative Diagnosis: Bleeding left groin Post-operative diagnosis:  Same Surgeon:  Apolinar JunesBrandon C. Randie Heinzain, MD Assistant: Doreatha MassedSamantha Rhyne, PA Procedure Performed: 1.  Exploration left groin groin wound 2.  Harvest left greater saphenous vein 3.  Patch angioplasty of left common femoral and profunda arteries  Indications: 62 year old male has history of multiple bilateral lower extremity interventions.  He also has bilateral above-knee amputations.  He was admitted with bleeding from his left groin with a known PTFE bypass there.  He is now indicated for groin exploration with possible repair  Findings: The PTFE bypass in the left groin was grossly infected and was cultured.  There is a communication to the above-knee amputation site from the groin wound.  There was flow into the profunda femoris artery.  After removing the graft we placed a saphenous vein patch from the common femoral down onto the profunda femoris artery.  At completion we placed a wound VAC in the groin and we packed the distal wound.  This wound is tenuous to heal at best.  If it does not heal he will need consideration of muscle flap   Procedure:  The patient was identified in the holding area and taken to the operating room where is placed supine on the operative table and general anesthesia was induced.  He was sterilely prepped and draped left and right legs given antibiotics and timeout was called.  We began with extending the opening in the wound cephalad and inferiorly as well.  We dissected down onto the inguinal ligament attempted to get control proximally.  I then dissected distally was able to identify the occluded SFA as well as the occluded graft in the profunda femoris artery.  We then were dissecting around unfortunately made an injury to the graft itself.  We are able to hold pressure dissected higher up in the external iliac  artery and get a clamp on that which controlled the  bleeding.  I also placed the graft on the profunda.  I then trimmed the graft away entirely.  Given that the SFA was occluded I actually endarterectomized this and harvested for possible patch transected just distal to the common femoral artery.  Then identified a saphenous vein in the wound and harvested this for approximately 5 cm.  I tied off with 2-0 silk proximally and distally.  I spatulated it opened it longitudinally.  I then gave the patient 5000 units of heparin retrograde bladder profundal antegrade bled our external iliac artery.  Then sewed patch in place around the common femoral artery on to the profunda femoris artery with 5-0 Prolene suture taking very large bites.  I completion there were some areas of bleeding we released our clamps required some repair sutures including one with the pledget.  I had also sent cultures of the wound and I thoroughly irrigated this wound with greater than a liter of saline.  I did somewhat open the wound distally on the above-knee amputation site as well.  We then were able to reapproximate some tissue over the artery with interrupted 2-0 Vicryl sutures.  A wound VAC was fashioned and the skin opening.  I packed the distal wound with a Kerlix.  He was allowed away from anesthesia having tolerated procedure well without immediate comp occasion.  All counts were correct at completion.  Next  EBL 250 cc.   Jonnell Hentges C. Randie Heinzain, MD [facility] [place] Office: [phone]  Pager: [phone]

## 2018-01-09 NOTE — Consult Note (Signed)
Hospital Consult    Reason for Consult: Bleeding left groin  referring Physician:  Dr. Ophelia Charter . MRN #:  [id number]  History of Present Illness: This is a 62 y.o. male   History of a left femoral to popliteal artery bypass with graft.  He separately has bilateral above-knee amputations.  During his last amputation the graft was excised.  He now presents with bleeding from the left groin and Surgicel has been placed for control.  We are consulted to evaluate bleeding left groin.  He has been transfused.  Bleeding..  Procedure patient.  Placed  Past Medical History:  Diagnosis Date  . Coronary artery disease   . Encephalopathy acute 07/2016  . HTN (hypertension)   . Hyperlipemia   . Myocardial infarction (HCC)   . Non-healing surgical wound    left AKA  . Noncompliance   . PAD (peripheral artery disease) (HCC)    a.  s/p left femoral to PT artery bypass with propaten on 02/16/16.    Past Surgical History:  Procedure Laterality Date  . ABDOMINAL AORTOGRAM W/LOWER EXTREMITY N/A 08/02/2016   Procedure: Abdominal Aortogram w/Lower Extremity;  Surgeon: Maeola Harman, MD;  Location: [facility] INVASIVE CV LAB;  Service: Cardiovascular;  Laterality: N/A;  . ABOVE KNEE LEG AMPUTATION Right 12/04/2016  . AMPUTATION Left 06/25/2016   Procedure: AMPUTATION ABOVE KNEE;  Surgeon: Nada Libman, MD;  Location: [facility] OR;  Service: Vascular;  Laterality: Left;  . AMPUTATION Right 12/04/2016   Procedure: RIGHT AMPUTATION ABOVE KNEE;  Surgeon: Maeola Harman, MD;  Location: [facility] OR;  Service: Vascular;  Laterality: Right;  . AMPUTATION Left 10/08/2017   Procedure: LEFT ABOVE KNEE AMPUTATION REVISION;  Surgeon: Maeola Harman, MD;  Location: [facility] OR;  Service: Vascular;  Laterality: Left;  . BYPASS GRAFT POPLITEAL TO TIBIAL Left 06/21/2016   Procedure: BYPASS GRAFT POPLITEAL TO TIBIAL USING GORE PROPATEN GRAFT;  Surgeon: Maeola Harman, MD;  Location: [facility] OR;  Service: Vascular;   Laterality: Left;  . EMBOLECTOMY Left 06/21/2016   Procedure: EMBOLECTOMY left lower extremity.;  Surgeon: Maeola Harman, MD;  Location: [facility] OR;  Service: Vascular;  Laterality: Left;  . ENDARTERECTOMY FEMORAL Left 02/16/2016   Procedure: LEFT FEMORAL ENDARTERECTOMY;  Surgeon: Maeola Harman, MD;  Location: [facility] OR;  Service: Vascular;  Laterality: Left;  . FEMORAL BYPASS  02/16/2016   LEFT FEMORAL-POSTERIOR TIBIAL ARTERY BYPASS  WITH PROPATEN 6 MM X 80 CM VASCULAR RING GRAFT (Left)  . FEMORAL-TIBIAL BYPASS GRAFT Left 02/16/2016   Procedure: LEFT FEMORAL-POSTERIOR TIBIAL ARTERY BYPASS  WITH PROPATEN 6 MM X 80 CM VASCULAR RING GRAFT;  Surgeon: Maeola Harman, MD;  Location: [facility] OR;  Service: Vascular;  Laterality: Left;  . FRACTURE SURGERY    . I&D EXTREMITY Left 09/03/2016   Procedure: IRRIGATION AND DEBRIDEMENT EXTREMITY - LEFT AKA;  Surgeon: Maeola Harman, MD;  Location: [facility] OR;  Service: Vascular;  Laterality: Left;  . PATCH ANGIOPLASTY Left 02/16/2016   Procedure: PATCH ANGIOPLASTY WITH Livia Snellen BIOLOGIC PATCH 1 CM X 6 CM;  Surgeon: Maeola Harman, MD;  Location: [facility] OR;  Service: Vascular;  Laterality: Left;  . PERIPHERAL VASCULAR BALLOON ANGIOPLASTY Right 08/02/2016   Procedure: Peripheral Vascular Balloon Angioplasty;  Surgeon: Maeola Harman, MD;  Location: [facility] INVASIVE CV LAB;  Service: Cardiovascular;  Laterality: Right;  peroneal artery  . PERIPHERAL VASCULAR CATHETERIZATION N/A 07/07/2015   Procedure: Abdominal Aortogram w/Lower Extremity;  Surgeon: Fransisco Hertz, MD;  Location: [facility] INVASIVE  CV LAB;  Service: Cardiovascular;  Laterality: N/A;  . PERIPHERAL VASCULAR CATHETERIZATION N/A 02/15/2016   Procedure: Abdominal Aortogram w/Lower Extremity;  Surgeon: Maeola HarmanBrandon Christopher Cain, MD;  [facility];  Service: Cardiovascular;  Laterality: N/A;  . PERIPHERAL VASCULAR CATHETERIZATION Bilateral 06/20/2016   Procedure: Lower  Extremity Angiography;  Surgeon: Maeola HarmanBrandon Christopher Cain, MD;  [facility];  Service: Cardiovascular;  Laterality: Bilateral;  limited runoff on rt leg thrombolysis lt leg bypass graft  . PERIPHERAL VASCULAR INTERVENTION Right 08/02/2016   Procedure: Peripheral Vascular Intervention;  Surgeon: Maeola HarmanBrandon Christopher Cain, MD;  [facility];  Service: Cardiovascular;  Laterality: Right;  Femoral , popliteal  . 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    No Known Allergies  Prior to Admission medications   Medication Sig Start Date End Date Taking? Authorizing Provider  acetaminophen (TYLENOL) 325 MG tablet Take 2 tablets (650 mg total) by mouth every 6 (six) hours as needed for mild pain, fever or headache. 11/12/17  Yes Arrien, York RamMauricio Daniel, MD  amLODipine (NORVASC) 10 MG tablet Take 1 tablet (10 mg total) by mouth daily. 07/07/16  Yes Rhyne, Ames CoupeSamantha J, PA-C  aspirin EC 81 MG EC tablet Take 1 tablet (81 mg total) by mouth daily. 07/07/16  Yes Rhyne, Ames CoupeSamantha J, PA-C  atorvastatin (LIPITOR) 40 MG tablet Take 1 tablet (40 mg total) by mouth daily at 6 PM. 02/18/16  Yes Kirby CriglerIkramullah, Mir Mohammed, MD  clopidogrel (PLAVIX) 75 MG tablet Take 1 tablet (75 mg total) by mouth daily. 07/07/16  Yes Rhyne, Samantha J, PA-C  docusate sodium (COLACE) 100 MG capsule Take 1 capsule (100 mg total) by mouth daily. 02/18/16  Yes Kirby CriglerIkramullah, Mir Mohammed, MD  folic acid (FOLVITE) 1 MG tablet Take 1 tablet (1 mg total) by mouth daily. 07/07/16  Yes Rhyne, Samantha J, PA-C  Lactobacillus (ACIDOPHILUS PO) Take 1 capsule by mouth every 12 (twelve) hours. x30 days   Yes [provider]  Lidocaine (ASPERCREME LIDOCAINE) 4 % PTCH Apply 1 patch topically daily. Apply to left shoulder   Yes [provider]  metoprolol tartrate (LOPRESSOR) 25 MG tablet Take 1 tablet (25 mg total) by mouth 2 (two) times daily. Patient taking differently: Take  25 mg by mouth 2 (two) times daily. Hold for SBP<100, pulse <60 10/10/17  Yes Rhyne, Samantha J, PA-C  mirtazapine (REMERON) 15 MG tablet Take 15 mg by mouth at bedtime.   Yes [provider]  Multiple Vitamin (TAB-A-VITE PO) Take 1 tablet by mouth daily.   Yes [provider]  oxyCODONE-acetaminophen (PERCOCET/ROXICET) 5-325 MG tablet Take 1 tablet by mouth every 6 (six) hours as needed for severe pain. 11/12/17  Yes Arrien, York RamMauricio Daniel, MD  pantoprazole (PROTONIX) 20 MG tablet Take 20 mg by mouth daily as needed for heartburn or indigestion.    Yes [provider]  polyethylene glycol (MIRALAX / GLYCOLAX) packet Take 17 g by mouth daily.   Yes [provider]  PROTEIN PO Take 30 mLs by mouth 2 (two) times daily. PROSTAT   Yes [provider]  senna-docusate (SENNA-PLUS) 8.6-50 MG tablet Take 2 tablets by mouth at bedtime.   Yes [provider]  spironolactone (ALDACTONE) 25 MG tablet Take 1 tablet (25 mg total) by mouth daily. 07/07/16  Yes Rhyne, Samantha J, PA-C  UNABLE TO FIND Take 60 mLs by mouth 3 (three) times daily. Med Name: Medpass 2.0  Yes [provider]  Vitamin D, Ergocalciferol, (DRISDOL) 50000 units CAPS capsule Take 50,000 Units by mouth every 7 (seven) days.   Yes [provider]  collagenase (SANTYL) ointment Apply topically daily. Patient not taking: Reported on 01/09/2018 11/12/17   Arrien, York Ram, MD    Social History   Socioeconomic History  . Marital status: Single    Spouse name: Not on file  . Number of children: Not on file  . Years of education: Not on file  . Highest education level: Not on file  Occupational History  . Occupation: Unemployed  Social Needs  . Financial resource strain: Not on file  . Food insecurity:    Worry: Not on file    Inability: Not on file  . Transportation needs:    Medical: Not on file    Non-medical: Not on file  Tobacco Use  . Smoking status:  Former Smoker    Packs/day: 3.00    Years: 40.00    Pack years: 120.00  . Smokeless tobacco: Never Used  . Tobacco comment: cutting back amount he is buying  Substance and Sexual Activity  . Alcohol use: Not Currently    Alcohol/week: 8.4 oz    Types: 14 Cans of beer per week    Comment: Drinks a 40 oz "tallboy" daily   [id number]  I quitdrinking  . Drug use: No  . Sexual activity: Yes    Birth control/protection: Condom  Lifestyle  . Physical activity:    Days per week: Not on file    Minutes per session: Not on file  . Stress: Not on file  Relationships  . Social connections:    Talks on phone: Not on file    Gets together: Not on file    Attends religious service: Not on file    Active member of club or organization: Not on file    Attends meetings of clubs or organizations: Not on file    Relationship status: Not on file  . Intimate partner violence:    Fear of current or ex partner: Not on file    Emotionally abused: Not on file    Physically abused: Not on file    Forced sexual activity: Not on file  Other Topics Concern  . Not on file  Social History Narrative   Smoking cigarettes since 62 y.o   Drinks beer 24 oz every other day     Family History  Problem Relation Age of Onset  . Cancer Mother        deceased in 2s   . Heart attack Mother        diseased  . Heart disease Father   . Stroke Sister        age 57s  . Heart disease Brother        pacemaker in his 53s    ROS: [x]  Positive   [ ]  Negative   [ ]  All sytems reviewed and are negative  Cardiovascular: []  chest pain/pressure []  palpitations []  SOB lying flat []  DOE []  pain in legs while walking []  pain in legs at rest []  pain in legs at night []  non-healing ulcers []  hx of DVT []  swelling in legs  Pulmonary: []  productive cough []  asthma/wheezing []  home O2  Neurologic: []  weakness in []  arms []  legs []  numbness in []  arms []  legs []  hx of CVA []  mini stroke [] difficulty speaking or  slurred speech []  temporary loss of vision in one eye []   dizziness  Hematologic: []  hx of cancer []  bleeding problems []  problems with blood clotting easily  Endocrine:   []  diabetes []  thyroid disease  GI []  vomiting blood []  blood in stool  GU: []  CKD/renal failure []  HD--[]  M/W/F or []  T/T/S []  burning with urination []  blood in urine  Psychiatric: []  anxiety []  depression  Musculoskeletal: []  arthritis []  joint pain  Integumentary: []  rashes []  ulcers  Constitutional: []  fever []  chills   Physical Examination  Vitals:   01/09/18 1300 01/09/18 1425  BP: 108/84 113/78  Pulse: (!) 107 (!) 101  Resp: 14   Temp:    SpO2: 98%    Body mass index is 15.98 kg/m.  General: Awake and alert HENT: WNL, normocephalic Pulmonary: normal non-labored breathing Cardiac: rrr Abdomen: soft, NT/ND, no masses Extremities: Bilateral above-knee amputations There is evidence of bleeding from a 2 cm wound in his left groin Surgicel was packed   CBC    Component Value Date/Time   WBC 24.3 (H) 01/09/2018 1019   RBC 4.51 01/09/2018 1019   HGB 12.8 (L) 01/09/2018 1019   HCT 38.4 (L) 01/09/2018 1019   HCT 22.1 (L) 11/10/2017 0443   PLT 631 (H) 01/09/2018 1019   MCV 85.1 01/09/2018 1019   MCH 28.4 01/09/2018 1019   MCHC 33.3 01/09/2018 1019   RDW 16.8 (H) 01/09/2018 1019   LYMPHSABS 1.0 01/09/2018 1019   MONOABS 1.3 (H) 01/09/2018 1019   EOSABS 0.0 01/09/2018 1019   BASOSABS 0.1 01/09/2018 1019    BMET    Component Value Date/Time   NA 132 (L) 01/09/2018 1019   K 5.6 (H) 01/09/2018 1019   CL 97 (L) 01/09/2018 1019   CO2 18 (L) 01/09/2018 1019   GLUCOSE 112 (H) 01/09/2018 1019   BUN 32 (H) 01/09/2018 1019   CREATININE 1.12 01/09/2018 1019   CREATININE 0.92 11/13/2013 1031   CALCIUM 8.6 (L) 01/09/2018 1019   GFRNONAA >60 01/09/2018 1019   GFRNONAA >89 11/13/2013 1031   GFRAA >60 01/09/2018 1019   GFRAA >89 11/13/2013 1031    COAGS: Lab Results    Component Value Date   INR 1.07 01/09/2018   INR 1.15 11/09/2017   INR 1.00 09/03/2016      ASSESSMENT/PLAN: This is a 62 y.o. male with previous bilateral above-knee amputations.  He now has bleeding from a left groin wound where he has a previous bypass surgery.  We will plan to take him to the operating room today to explore this wound likely excised the graft possible place patch angioplasty.  Consent has been obtained from his younger brother.  Brandon C. Randie Heinz, MD [facility] [place] Office: [phone] Pager: [phone]

## 2018-01-09 NOTE — Progress Notes (Signed)
Consent for surgery obtained from brother/legal guardian via phone. Double verified by Onalee Huaavid, RN.

## 2018-01-09 NOTE — Anesthesia Postprocedure Evaluation (Signed)
Anesthesia Post Note  Patient: Freddie ApleyJerome D Penick  Procedure(s) Performed: IRRIGATION AND DEBRIDEMENT GROIN (Left ) PATCH ANGIOPLASTY OF LEFT COMMON FEMORAL AND PROFUNDA  USING SAPHENOUS VEIN (Left Groin) LEFT SAPHENOUS VEIN HARVEST (Left Groin)     Patient location during evaluation: PACU Anesthesia Type: General Level of consciousness: confused Pain management: pain level controlled Vital Signs Assessment: post-procedure vital signs reviewed and stable Respiratory status: spontaneous breathing, nonlabored ventilation, respiratory function stable and patient connected to nasal cannula oxygen Cardiovascular status: blood pressure returned to baseline and stable Postop Assessment: no apparent nausea or vomiting Anesthetic complications: no    Last Vitals:  Vitals:   01/09/18 1800 01/09/18 2011  BP:  99/77  Pulse: (!) 112 (!) 120  Resp: 12 20  Temp:    SpO2: 100% 100%    Last Pain:  Vitals:   01/09/18 1126  TempSrc: Oral                 Temitayo Covalt COKER

## 2018-01-10 ENCOUNTER — Encounter (HOSPITAL_COMMUNITY): Payer: Self-pay | Admitting: Vascular Surgery

## 2018-01-10 DIAGNOSIS — T827XXA Infection and inflammatory reaction due to other cardiac and vascular devices, implants and grafts, initial encounter: Secondary | ICD-10-CM | POA: Diagnosis present

## 2018-01-10 DIAGNOSIS — Z9119 Patient's noncompliance with other medical treatment and regimen: Secondary | ICD-10-CM | POA: Diagnosis not present

## 2018-01-10 DIAGNOSIS — Y835 Amputation of limb(s) as the cause of abnormal reaction of the patient, or of later complication, without mention of misadventure at the time of the procedure: Secondary | ICD-10-CM | POA: Diagnosis present

## 2018-01-10 DIAGNOSIS — L089 Local infection of the skin and subcutaneous tissue, unspecified: Secondary | ICD-10-CM | POA: Diagnosis present

## 2018-01-10 DIAGNOSIS — L7622 Postprocedural hemorrhage and hematoma of skin and subcutaneous tissue following other procedure: Secondary | ICD-10-CM | POA: Diagnosis present

## 2018-01-10 DIAGNOSIS — R578 Other shock: Secondary | ICD-10-CM | POA: Diagnosis present

## 2018-01-10 DIAGNOSIS — Z7982 Long term (current) use of aspirin: Secondary | ICD-10-CM | POA: Diagnosis not present

## 2018-01-10 DIAGNOSIS — I739 Peripheral vascular disease, unspecified: Secondary | ICD-10-CM | POA: Diagnosis not present

## 2018-01-10 DIAGNOSIS — T8789 Other complications of amputation stump: Secondary | ICD-10-CM

## 2018-01-10 DIAGNOSIS — B9562 Methicillin resistant Staphylococcus aureus infection as the cause of diseases classified elsewhere: Secondary | ICD-10-CM | POA: Diagnosis present

## 2018-01-10 DIAGNOSIS — T148XXA Other injury of unspecified body region, initial encounter: Secondary | ICD-10-CM | POA: Diagnosis not present

## 2018-01-10 DIAGNOSIS — E785 Hyperlipidemia, unspecified: Secondary | ICD-10-CM | POA: Diagnosis present

## 2018-01-10 DIAGNOSIS — Z86718 Personal history of other venous thrombosis and embolism: Secondary | ICD-10-CM | POA: Diagnosis not present

## 2018-01-10 DIAGNOSIS — R58 Hemorrhage, not elsewhere classified: Secondary | ICD-10-CM | POA: Diagnosis not present

## 2018-01-10 DIAGNOSIS — R402360 Coma scale, best motor response, obeys commands, unspecified time: Secondary | ICD-10-CM | POA: Diagnosis present

## 2018-01-10 DIAGNOSIS — T8744 Infection of amputation stump, left lower extremity: Secondary | ICD-10-CM | POA: Diagnosis not present

## 2018-01-10 DIAGNOSIS — Z978 Presence of other specified devices: Secondary | ICD-10-CM | POA: Diagnosis not present

## 2018-01-10 DIAGNOSIS — E440 Moderate protein-calorie malnutrition: Secondary | ICD-10-CM | POA: Diagnosis present

## 2018-01-10 DIAGNOSIS — R739 Hyperglycemia, unspecified: Secondary | ICD-10-CM | POA: Diagnosis present

## 2018-01-10 DIAGNOSIS — R402140 Coma scale, eyes open, spontaneous, unspecified time: Secondary | ICD-10-CM | POA: Diagnosis present

## 2018-01-10 DIAGNOSIS — Z87891 Personal history of nicotine dependence: Secondary | ICD-10-CM | POA: Diagnosis not present

## 2018-01-10 DIAGNOSIS — E860 Dehydration: Secondary | ICD-10-CM | POA: Diagnosis present

## 2018-01-10 DIAGNOSIS — Z681 Body mass index (BMI) 19 or less, adult: Secondary | ICD-10-CM | POA: Diagnosis not present

## 2018-01-10 DIAGNOSIS — N179 Acute kidney failure, unspecified: Secondary | ICD-10-CM | POA: Diagnosis present

## 2018-01-10 DIAGNOSIS — I10 Essential (primary) hypertension: Secondary | ICD-10-CM | POA: Diagnosis present

## 2018-01-10 DIAGNOSIS — T8189XA Other complications of procedures, not elsewhere classified, initial encounter: Secondary | ICD-10-CM | POA: Diagnosis present

## 2018-01-10 DIAGNOSIS — T8579XA Infection and inflammatory reaction due to other internal prosthetic devices, implants and grafts, initial encounter: Secondary | ICD-10-CM | POA: Diagnosis not present

## 2018-01-10 DIAGNOSIS — Z89612 Acquired absence of left leg above knee: Secondary | ICD-10-CM | POA: Diagnosis not present

## 2018-01-10 DIAGNOSIS — E782 Mixed hyperlipidemia: Secondary | ICD-10-CM

## 2018-01-10 DIAGNOSIS — R402240 Coma scale, best verbal response, confused conversation, unspecified time: Secondary | ICD-10-CM | POA: Diagnosis present

## 2018-01-10 DIAGNOSIS — Z95820 Peripheral vascular angioplasty status with implants and grafts: Secondary | ICD-10-CM | POA: Diagnosis not present

## 2018-01-10 DIAGNOSIS — Z89611 Acquired absence of right leg above knee: Secondary | ICD-10-CM | POA: Diagnosis not present

## 2018-01-10 DIAGNOSIS — I2510 Atherosclerotic heart disease of native coronary artery without angina pectoris: Secondary | ICD-10-CM | POA: Diagnosis present

## 2018-01-10 DIAGNOSIS — I70209 Unspecified atherosclerosis of native arteries of extremities, unspecified extremity: Secondary | ICD-10-CM | POA: Diagnosis present

## 2018-01-10 LAB — CBC
HCT: 25.7 % — ABNORMAL LOW (ref 39.0–52.0)
Hemoglobin: 8.4 g/dL — ABNORMAL LOW (ref 13.0–17.0)
MCH: 29.1 pg (ref 26.0–34.0)
MCHC: 32.7 g/dL (ref 30.0–36.0)
MCV: 88.9 fL (ref 78.0–100.0)
PLATELETS: 454 10*3/uL — AB (ref 150–400)
RBC: 2.89 MIL/uL — ABNORMAL LOW (ref 4.22–5.81)
RDW: 15.9 % — AB (ref 11.5–15.5)
WBC: 19.4 10*3/uL — AB (ref 4.0–10.5)

## 2018-01-10 LAB — BASIC METABOLIC PANEL
Anion gap: 13 (ref 5–15)
BUN: 34 mg/dL — AB (ref 8–23)
CALCIUM: 7.7 mg/dL — AB (ref 8.9–10.3)
CO2: 20 mmol/L — ABNORMAL LOW (ref 22–32)
CREATININE: 1.58 mg/dL — AB (ref 0.61–1.24)
Chloride: 100 mmol/L (ref 98–111)
GFR, EST AFRICAN AMERICAN: 53 mL/min — AB (ref >60)
GFR, EST NON AFRICAN AMERICAN: 46 mL/min — AB (ref >60)
Glucose, Bld: 142 mg/dL — ABNORMAL HIGH (ref 70–99)
Potassium: 5.1 mmol/L (ref 3.5–5.1)
SODIUM: 133 mmol/L — AB (ref 135–145)

## 2018-01-10 LAB — HIV ANTIBODY (ROUTINE TESTING W REFLEX): HIV SCREEN 4TH GENERATION: NONREACTIVE

## 2018-01-10 MED ORDER — VANCOMYCIN HCL IN DEXTROSE 750-5 MG/150ML-% IV SOLN
750.0000 mg | INTRAVENOUS | Status: DC
  Administered 2018-01-10: 750 mg via INTRAVENOUS
  Filled 2018-01-10 (×2): qty 150

## 2018-01-10 MED ORDER — SODIUM CHLORIDE 0.9% FLUSH
10.0000 mL | INTRAVENOUS | Status: DC | PRN
  Administered 2018-01-10 – 2018-01-11 (×2): 10 mL
  Administered 2018-01-11: 30 mL
  Filled 2018-01-10 (×3): qty 40

## 2018-01-10 NOTE — Progress Notes (Signed)
  Progress Note    01/10/2018 8:20 AM 1 Day Post-Op  Subjective: No acute issues  Vitals:   01/10/18 0557 01/10/18 0812  BP: 105/83 110/88  Pulse: (!) 105 (!) 112  Resp: 17 18  Temp: 98.4 F (36.9 C) 98.8 F (37.1 C)  SpO2: 100% 100%    Physical Exam: Left groin wound VAC to suction Wound on AK site packed there is no evidence of bleeding   CBC    Component Value Date/Time   WBC 19.4 (H) 01/10/2018 0613   RBC 2.89 (L) 01/10/2018 0613   HGB 8.4 (L) 01/10/2018 0613   HCT 25.7 (L) 01/10/2018 0613   HCT 22.1 (L) 11/10/2017 0443   PLT 454 (H) 01/10/2018 0613   MCV 88.9 01/10/2018 0613   MCH 29.1 01/10/2018 0613   MCHC 32.7 01/10/2018 0613   RDW 15.9 (H) 01/10/2018 0613   LYMPHSABS 1.0 01/09/2018 1019   MONOABS 1.3 (H) 01/09/2018 1019   EOSABS 0.0 01/09/2018 1019   BASOSABS 0.1 01/09/2018 1019    BMET    Component Value Date/Time   NA 133 (L) 01/10/2018 0613   K 5.1 01/10/2018 0613   CL 100 01/10/2018 0613   CO2 20 (L) 01/10/2018 0613   GLUCOSE 142 (H) 01/10/2018 0613   BUN 34 (H) 01/10/2018 0613   CREATININE 1.58 (H) 01/10/2018 0613   CREATININE 0.92 11/13/2013 1031   CALCIUM 7.7 (L) 01/10/2018 0613   GFRNONAA 46 (L) 01/10/2018 0613   GFRNONAA >89 11/13/2013 1031   GFRAA 53 (L) 01/10/2018 0613   GFRAA >89 11/13/2013 1031    INR    Component Value Date/Time   INR 1.07 01/09/2018 1149     Intake/Output Summary (Last 24 hours) at 01/10/2018 0820 Last data filed at 01/10/2018 0200 Gross per 24 hour  Intake 1615 ml  Output 550 ml  Net 1065 ml     Assessment:  62 y.o. male is s/p excision of left femoral graft that was previously thrombosed and had become infected and a patch angioplasty of his left common femoral artery was performed with saphenous vein.  Plan: Continue antibiotics and follow-up cultures preliminary positive for MRSA Plan to change wound VAC on Monday Wet-to-dry dressings on right AKA stump site   Burna Atlas C. Randie Heinzain, MD [facility] [place] Office: [phone] Pager: [phone]  01/10/2018 8:20 AM

## 2018-01-10 NOTE — Progress Notes (Addendum)
[facility]                                                                              Patient Demographics  Stephen Bowen, is a 62 y.o. male, DOB - Jun 18, 1956, ZOX:[id number]  Admit date - 01/09/2018   Admitting Physician [name], MD  Outpatient Primary MD for the patient is [name], Imran P, MD  Outpatient specialists:   LOS - 0  days   Medical records reviewed and are as summarized below:    No chief complaint on file.      Brief summary   Patient is a 62 year old male with history of HTN; HLD; CAD; PVD s/p BL amputation; and chronic left inguinal wound, left femoropopliteal bypass with graft presenting from SNF with persistent bleeding from left inguinal wound.   Patient has had ongoing poor wound healing and continuing to have bleeding from his left AKA stump, poorly healing chronic left inguinal wound.  At skilled nursing facility he was found to be aggressively bleeding from his inguinal wound and was [facility].  Due to the concern for hemorrhagic shock, needing vascular surgery consult, patient was transfused 2 units of packed RBCs , transferred to [facility] for further work-up.   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 -Vascular surgery consult was obtained, seen by Dr. Randie Heinzain, patient underwent exploration of the left groin wound with angioplasty, wound VAC placed, postop day #1 -Monitor H&H, hemoglobin 8.4, received 2 units packed RBCs yesterday - Transfuse as needed for hemoglobin less than 8. -Vascular surgery following.  Continue aspirin, hold Plavix for now until cleared by vascular surgery -Wound cultures growing GPC, started on IV vancomycin  Active Problems:   Essential hypertension -BP soft but stable,  continue metoprolol but hold amlodipine    HLD (hyperlipidemia) - Continue Lipitor  Acute kidney injury Likely due to #1, continue IV fluid hydration, hold Aldactone Transfuse for hemoglobin less than 8   Code Status: Full CODE STATUS DVT Prophylaxis: Bilateral amputation, cannot use SCDs, bleeding from the groin wound, hence cannot use prophylactic anticoagulation Family Communication: Discussed in detail with the patient, all imaging results, lab results explained to the patient    Disposition Plan: When cleared by vascular surgery  Time Spent in minutes   35 minutes  Procedures:   1. Exploration left groin groin wound 2.  Harvest left greater saphenous vein 3.  Patch angioplasty of left common femoral and profunda arteries    Consultants:   Vascular surgery  Antimicrobials:      Medications  Scheduled Meds: . amLODipine  10 mg Oral Daily  . aspirin  81 mg Oral Daily  . atorvastatin  40 mg Oral q1800  . docusate sodium  100 mg Oral BID  . folic acid  1 mg Oral Daily  . metoprolol tartrate  25 mg Oral BID  . mirtazapine  15 mg Oral QHS  .  polyethylene glycol  17 g Oral Daily  . senna-docusate  2 tablet Oral QHS  . sodium chloride flush  3 mL Intravenous Q12H   Continuous Infusions: . sodium chloride    . sodium chloride 75 mL (01/10/18 1010)  . vancomycin 750 mg (01/10/18 1007)   PRN Meds:.acetaminophen **OR** acetaminophen, ondansetron **OR** ondansetron (ZOFRAN) IV, oxyCODONE-acetaminophen, pantoprazole, sodium chloride flush   Antibiotics   Anti-infectives (From admission, onward)   Start     Dose/Rate Route Frequency Ordered Stop   01/10/18 0900  vancomycin (VANCOCIN) IVPB 750 mg/150 ml premix     750 mg 150 mL/hr over 60 Minutes Intravenous Every 24 hours 01/10/18 0842     01/09/18 1030  ceFAZolin (ANCEF) IVPB 1 g/50 mL premix  Status:  Discontinued    Note to Pharmacy:  Send with pt to OR   1 g 100 mL/hr over 30 Minutes Intravenous On call  01/09/18 1027 01/10/18 0855        Subjective:   [name] was seen and examined today.  Somewhat confused, difficult obtaining review of system from the patient, postop day #1.  No acute issues overnight.  No fevers chills, nausea vomiting or abdominal pain.  No chest pain or shortness of breath.  Objective:   Vitals:   01/09/18 1800 01/09/18 2011 01/10/18 0557 01/10/18 0812  BP:  99/77 105/83 110/88  Pulse: (!) 112 (!) 120 (!) 105 (!) 112  Resp: 12 20 17 18   Temp:  98.2 F (36.8 C) 98.4 F (36.9 C) 98.8 F (37.1 C)  TempSrc:   Oral Oral  SpO2: 100% 100% 100% 100%  Weight:  44.9 kg (99 lb)    Height:        Intake/Output Summary (Last 24 hours) at 01/10/2018 1017 Last data filed at 01/10/2018 0900 Gross per 24 hour  Intake 1615 ml  Output 550 ml  Net 1065 ml     Wt Readings from Last 3 Encounters:  01/09/18 44.9 kg (99 lb)  12/25/17 44.9 kg (99 lb)  11/11/17 45 kg (99 lb 3.3 oz)     Exam  General: Somewhat confused, alert and oriented to self, NAD  Eyes:   HEENT:  Atraumatic, normocephalic,   Cardiovascular: S1 S2 auscultated, Regular rate and rhythm.  Respiratory: Clear to auscultation bilaterally, no wheezing, rales or rhonchi  Gastrointestinal: Soft, nontender, nondistended, + bowel sounds  Ext: bilateral AKA with dressing on left stump and wound VAC  Neuro: difficult to assess, confused  Musculoskeletal: No digital cyanosis, clubbing  Skin: No rashes  Psych: somewhat confused   Data Reviewed:  I have personally reviewed following labs and imaging studies  Micro Results Recent Results (from the past 240 hour(s))  Blood Cultures x 2 sites     Status: None (Preliminary result)   Collection Time: 01/09/18  9:32 AM  Result Value Ref Range Status   Specimen Description BLOOD LEFT HAND  Final   Special Requests   Final    BOTTLES DRAWN AEROBIC ONLY Blood Culture adequate volume   Culture   Final    NO GROWTH < 24 HOURS Performed at  [facility], [address] [place], [place] [address]    Report Status PENDING  Incomplete  Blood Cultures x 2 sites     Status: None (Preliminary result)   Collection Time: 01/09/18 10:10 AM  Result Value Ref Range Status   Specimen Description BLOOD LEFT ARM  Final   Special Requests   Final  BOTTLES DRAWN AEROBIC ONLY Blood Culture adequate volume   Culture   Final    NO GROWTH < 24 HOURS Performed at [facility], [address] [place], [place] [address]    Report Status PENDING  Incomplete  MRSA PCR Screening     Status: Abnormal   Collection Time: 01/09/18  1:54 PM  Result Value Ref Range Status   MRSA by PCR POSITIVE (A) NEGATIVE Final    Comment:        The GeneXpert MRSA Assay (FDA approved for NASAL specimens only), is one component of a comprehensive MRSA colonization surveillance program. It is not intended to diagnose MRSA infection nor to guide or monitor treatment for MRSA infections. RESULT CALLED TO, READ BACK BY AND VERIFIED WITH: Donnita Falls RN 16:30 01/09/18 ([email or web address]) Performed at [facility], [address], [place], [place] [address]   Aerobic/Anaerobic Culture (surgical/deep wound)     Status: None (Preliminary result)   Collection Time: 01/09/18  3:59 PM  Result Value Ref Range Status   Specimen Description WOUND LEFT GROIN  Final   Special Requests PATIENT ON FOLLOWING ANCEF  Final   Gram Stain   Final    FEW WBC PRESENT,BOTH PMN AND MONONUCLEAR RARE GRAM POSITIVE COCCI Performed at [facility], [address], [place], [place] [address]    Culture PENDING  Incomplete   Report Status PENDING  Incomplete    Radiology Reports No results found.  Lab Data:  CBC: Recent Labs  Lab 01/09/18 1019 01/09/18 1650 01/10/18 0613  WBC 24.3*  --  19.4*  NEUTROABS 21.2*  --   --   HGB 12.8* 10.9* 8.4*  HCT 38.4* 32.0* 25.7*  MCV 85.1  --  88.9  PLT 631*  --  454*   Basic Metabolic Panel: Recent Labs  Lab  01/09/18 1019 01/09/18 1650 01/10/18 0613  NA 132* 133* 133*  K 5.6* 4.8 5.1  CL 97*  --  100  CO2 18*  --  20*  GLUCOSE 112*  --  142*  BUN 32*  --  34*  CREATININE 1.12  --  1.58*  CALCIUM 8.6*  --  7.7*   GFR: Estimated Creatinine Clearance: 31.2 mL/min (A) (by C-G formula based on SCr of 1.58 mg/dL (H)). Liver Function Tests: No results for input(s): AST, ALT, ALKPHOS, BILITOT, PROT, ALBUMIN in the last 168 hours. No results for input(s): LIPASE, AMYLASE in the last 168 hours. No results for input(s): AMMONIA in the last 168 hours. Coagulation Profile: Recent Labs  Lab 01/09/18 1149  INR 1.07   Cardiac Enzymes: No results for input(s): CKTOTAL, CKMB, CKMBINDEX, TROPONINI in the last 168 hours. BNP (last 3 results) No results for input(s): PROBNP in the last 8760 hours. HbA1C: Recent Labs    01/09/18 1019  HGBA1C 6.0*   CBG: Recent Labs  Lab 01/09/18 0735  GLUCAP 119*   Lipid Profile: No results for input(s): CHOL, HDL, LDLCALC, TRIG, CHOLHDL, LDLDIRECT in the last 72 hours. Thyroid Function Tests: No results for input(s): TSH, T4TOTAL, FREET4, T3FREE, THYROIDAB in the last 72 hours. Anemia Panel: No results for input(s): VITAMINB12, FOLATE, FERRITIN, TIBC, IRON, RETICCTPCT in the last 72 hours. Urine analysis:    Component Value Date/Time   COLORURINE YELLOW 11/08/2017 0031   APPEARANCEUR CLEAR 11/08/2017 0031   LABSPEC 1.016 11/08/2017 0031   PHURINE 5.0 11/08/2017 0031   GLUCOSEU NEGATIVE 11/08/2017 0031   HGBUR SMALL (A) 11/08/2017 0031  BILIRUBINUR NEGATIVE 11/08/2017 0031   KETONESUR 20 (A) 11/08/2017 0031   PROTEINUR NEGATIVE 11/08/2017 0031   UROBILINOGEN 0.2 11/13/2013 1634   NITRITE NEGATIVE 11/08/2017 0031   LEUKOCYTESUR NEGATIVE 11/08/2017 0031     Janean Eischen M.D. [facility] 01/10/2018, 10:17 AM  Pager: [phone] Between 7am to 7pm - call Pager - [phone]  After 7pm go to [email or web address] - password [email or web address]  Call night  coverage person covering after 7pm

## 2018-01-10 NOTE — Progress Notes (Signed)
Pharmacy Antibiotic Note  Stephen Bowen is a 62 y.o. male admitted on 01/09/2018 with bleeding from left inguinal wound.  Pharmacy has been consulted for vancomycin dosing for infected left femoral bypass s/p excision on 7/18. He had rare MRSA in left leg wound back in May. AKI noted, SCr 1.58 (baseline 0.7-0.8). Norm CrCl ~50 ml/min; patient is bilateral LE amputee.   Plan: Vancomycin 750 mg IV every 24 hours.  Goal trough 10-15 mcg/mL.  Monitor renal function, clinical progress, and C/S Trough at steady-state and as clinically indicated  Height: 5\' 6"  (167.6 cm) Weight: 99 lb (44.9 kg) IBW/kg (Calculated) : 63.8  Temp (24hrs), Avg:98.2 F (36.8 C), Min:97.3 F (36.3 C), Max:98.8 F (37.1 C)  Recent Labs  Lab 01/09/18 1019 01/10/18 0613  WBC 24.3* 19.4*  CREATININE 1.12 1.58*    Estimated Creatinine Clearance: 31.2 mL/min (A) (by C-G formula based on SCr of 1.58 mg/dL (H)).    No Known Allergies  Antimicrobials this admission: Cefazolin 2 g pre-op on 7/18 vancomycin 7/19 >>   Dose adjustments this admission:   Microbiology results: 7/18 left groin wound: rare GPC 7/18 MRSA PCR positive 7/18 BCx: sent  Thank you for allowing pharmacy to be a part of this patient's care.  Loura BackJennifer Ciales, PharmD, BCPS Clinical Pharmacist Clinical phone for 01/10/2018 until 3p is [phone] Please check AMION for all Pharmacist numbers by unit 01/10/2018 8:28 AM

## 2018-01-10 NOTE — Clinical Social Work Note (Signed)
CSW met with patient and his brother Stephen Bowen at the bedside regarding Stephen Bowen discharge disposition. CSW confirmed with patient that he is in agreement with being in [place] if possible. CSW explained to both the SNF search process and that his being able to come to [place] will depend on LTC Medicaid availability at a skilled facility. Brother expressed understanding.   CSW reviewed attending MD and Vascular MD's progress notes and per vascular surgery, the patient's wound vac will be changed on Monday. CSW will continue to monitor patient's progress and facilitate discharge either back to [facility] or a facility in [place] when medically stable.  Stephen Bowen, MSW, LCSW Licensed Clinical Social Worker [place] [phone]

## 2018-01-10 NOTE — NC FL2 (Signed)
MEDICAID FL2 LEVEL OF CARE SCREENING TOOL     IDENTIFICATION  Patient Name: Stephen Bowen Birthdate: [birth year] Sex: male Admission Date (Current Location): 01/09/2018  [facility] [place] Number:  Stephen Bowen [id number] N Facility and Address:  [facility], [address] [place], [place] [address]      Provider Number: [id number]  Attending Physician Name and Address:  Cathren Harsh, MD  Relative Name and Phone Number:  Stephen Bowen - brother, [phone]    Current Level of Care: SNF Recommended Level of Care: Skilled Nursing Facility(Patient from [facility], where he is a LTC resident) Prior Approval Number:    Date Approved/Denied:   PASRR Number: [id number](Eff. 08/03/16)  Discharge Plan: SNF    Current Diagnoses: Patient Active Problem List   Diagnosis Date Noted  . Wound infection 01/10/2018  . Hemorrhage from wound 01/09/2018  . Acute renal failure (ARF) (HCC) 01/09/2018  . Malnutrition of moderate degree 11/11/2017  . Sepsis (HCC) 11/09/2017  . Acute metabolic encephalopathy 11/09/2017  . Anemia 11/09/2017  . Tachycardia 11/09/2017  . Non-healing wound of amputation stump (HCC) 10/08/2017  . 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 Aortic atherosclerosis (HCC) 08/01/2016  . Coronary atherosclerosis of native coronary artery 08/01/2016  . Underweight 08/01/2016  . AKI (acute kidney injury) (HCC) 07/30/2016  . HLD (hyperlipidemia) 07/30/2016  . GERD (gastroesophageal reflux disease) 07/30/2016  . Acute encephalopathy 07/30/2016  . Abdominal pain 07/30/2016  . Pressure injury of skin 06/28/2016  . 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 Aphasia   . Hypotension 06/21/2016  . Stroke (HCC) 06/21/2016  . Acute MI, anterolateral wall, initial episode of care (HCC)   . Preoperative cardiovascular examination   . PAD (peripheral artery disease) (HCC) 02/13/2016  .  Hyperkalemia 02/13/2016  . Lactic acid increased 02/13/2016  . Essential hypertension 02/13/2016  . Seborrheic dermatitis 08/31/2015  . Actinic keratosis of multiple sites of head and neck 08/31/2015  . Tobacco use disorder 08/31/2015  . Skin ulcer of scalp (HCC) 08/31/2015  . Abnormal EKG 11/13/2013  . Hypertensive urgency 11/13/2013    Orientation RESPIRATION BLADDER Height & Weight     Self, Place  Normal Continent Weight: 99 lb (44.9 kg) Height:  5\' 6"  (167.6 cm)  BEHAVIORAL SYMPTOMS/MOOD NEUROLOGICAL BOWEL NUTRITION STATUS      Continent Diet(Heart Healthy)  AMBULATORY STATUS COMMUNICATION OF NEEDS Skin   Extensive Assist(Bilateral AKA's) Verbally Other (Comment)(Full thickness chronic woulnd to left stump with wet-to-dry dressing; Full thickness wound to left groin with wound vac (continuous 125 mmHg))                       Personal Care Assistance Level of Assistance  Bathing, Feeding, Dressing Bathing Assistance: Limited assistance Feeding assistance: Independent Dressing Assistance: Limited assistance     Functional Limitations Info  Sight, Hearing, Speech Sight Info: Adequate Hearing Info: Adequate Speech Info: Adequate    SPECIAL CARE FACTORS FREQUENCY                       Contractures Contractures Info: Not present    Additional Factors Info  Code Status, Allergies, Isolation Precautions Code Status Info: Full Allergies Info: No known allergies     Isolation Precautions Info: MRSA PCR positive       Current Medications (01/10/2018):  This is the current hospital active medication list  Current Facility-Administered Medications  Medication Dose Route Frequency Provider Last Rate Last Dose  . 0.9 %  sodium chloride infusion  10 mL/hr Intravenous Once Rhyne, Samantha J, PA-C      . 0.9 %  sodium chloride infusion   Intravenous Continuous Rhyne, Samantha J, PA-C 75 mL/hr at 01/10/18 1010 75 mL at 01/10/18 1010  . acetaminophen (TYLENOL)  tablet 650 mg  650 mg Oral Q6H PRN Rhyne, Samantha J, PA-C       Or  . acetaminophen (TYLENOL) suppository 650 mg  650 mg Rectal Q6H PRN Rhyne, Samantha J, PA-C      . aspirin chewable tablet 81 mg  81 mg Oral Daily Rhyne, Samantha J, PA-C   81 mg at 01/10/18 0843  . atorvastatin (LIPITOR) tablet 40 mg  40 mg Oral q1800 Rhyne, Samantha J, PA-C      . docusate sodium (COLACE) capsule 100 mg  100 mg Oral BID Rhyne, Samantha J, PA-C   100 mg at 01/10/18 0843  . folic acid (FOLVITE) tablet 1 mg  1 mg Oral Daily Rhyne, Samantha J, PA-C   1 mg at 01/10/18 0843  . metoprolol tartrate (LOPRESSOR) tablet 25 mg  25 mg Oral BID Dara LordsRhyne, Samantha J, PA-C   25 mg at 01/10/18 0844  . mirtazapine (REMERON) tablet 15 mg  15 mg Oral QHS Rhyne, Samantha J, PA-C   15 mg at 01/09/18 2135  . ondansetron (ZOFRAN) tablet 4 mg  4 mg Oral Q6H PRN Rhyne, Samantha J, PA-C       Or  . ondansetron (ZOFRAN) injection 4 mg  4 mg Intravenous Q6H PRN Rhyne, Samantha J, PA-C   4 mg at 01/09/18 1642  . oxyCODONE-acetaminophen (PERCOCET/ROXICET) 5-325 MG per tablet 1 tablet  1 tablet Oral Q6H PRN Dara LordsRhyne, Samantha J, PA-C   1 tablet at 01/09/18 1832  . pantoprazole (PROTONIX) EC tablet 20 mg  20 mg Oral Daily PRN Rhyne, Samantha J, PA-C      . polyethylene glycol (MIRALAX / GLYCOLAX) packet 17 g  17 g Oral Daily Rhyne, Samantha J, PA-C   17 g at 01/10/18 0842  . senna-docusate (Senokot-S) tablet 2 tablet  2 tablet Oral QHS Rhyne, Ames CoupeSamantha J, PA-C   2 tablet at 01/09/18 2135  . sodium chloride flush (NS) 0.9 % injection 10-40 mL  10-40 mL Intracatheter PRN Jonah BlueYates, Jennifer, MD      . sodium chloride flush (NS) 0.9 % injection 3 mL  3 mL Intravenous Q12H Rhyne, Samantha J, PA-C   3 mL at 01/10/18 0844  . vancomycin (VANCOCIN) IVPB 750 mg/150 ml premix  750 mg Intravenous Q24H Ilda BassetDurham, Jennifer D, [place] 150 mL/hr at 01/10/18 1007 750 mg at 01/10/18 1007     Discharge Medications: Please see discharge summary for a list of discharge  medications.  Relevant Imaging Results:  Relevant Lab Results:   Additional Information ss# [id number]  Stephen GoldmannCrawford, Yousuf Ager Bradley, LCSW

## 2018-01-11 DIAGNOSIS — L089 Local infection of the skin and subcutaneous tissue, unspecified: Secondary | ICD-10-CM

## 2018-01-11 DIAGNOSIS — T148XXA Other injury of unspecified body region, initial encounter: Secondary | ICD-10-CM

## 2018-01-11 DIAGNOSIS — N179 Acute kidney failure, unspecified: Secondary | ICD-10-CM

## 2018-01-11 LAB — BASIC METABOLIC PANEL
Anion gap: 4 — ABNORMAL LOW (ref 5–15)
BUN: 11 mg/dL (ref 8–23)
CALCIUM: 7.3 mg/dL — AB (ref 8.9–10.3)
CO2: 25 mmol/L (ref 22–32)
CREATININE: 0.68 mg/dL (ref 0.61–1.24)
Chloride: 107 mmol/L (ref 98–111)
GFR calc Af Amer: >60 mL/min (ref >60)
GFR calc non Af Amer: >60 mL/min (ref >60)
GLUCOSE: 103 mg/dL — AB (ref 70–99)
POTASSIUM: 3.9 mmol/L (ref 3.5–5.1)
SODIUM: 136 mmol/L (ref 135–145)

## 2018-01-11 LAB — CBC
HCT: 17.1 % — ABNORMAL LOW (ref 39.0–52.0)
Hemoglobin: 5.5 g/dL — CL (ref 13.0–17.0)
MCH: 29.4 pg (ref 26.0–34.0)
MCHC: 32.2 g/dL (ref 30.0–36.0)
MCV: 91.4 fL (ref 78.0–100.0)
PLATELETS: 308 10*3/uL (ref 150–400)
RBC: 1.87 MIL/uL — AB (ref 4.22–5.81)
RDW: 15.7 % — AB (ref 11.5–15.5)
WBC: 10.5 10*3/uL (ref 4.0–10.5)

## 2018-01-11 LAB — PREPARE RBC (CROSSMATCH)

## 2018-01-11 MED ORDER — METOPROLOL TARTRATE 12.5 MG HALF TABLET
12.5000 mg | ORAL_TABLET | Freq: Two times a day (BID) | ORAL | Status: DC
  Administered 2018-01-12 – 2018-01-14 (×5): 12.5 mg via ORAL
  Filled 2018-01-11 (×7): qty 1

## 2018-01-11 MED ORDER — SODIUM CHLORIDE 0.9% IV SOLUTION
Freq: Once | INTRAVENOUS | Status: AC
  Administered 2018-01-11: 12:00:00 via INTRAVENOUS

## 2018-01-11 MED ORDER — VANCOMYCIN HCL IN DEXTROSE 750-5 MG/150ML-% IV SOLN
750.0000 mg | Freq: Once | INTRAVENOUS | Status: AC
  Administered 2018-01-11: 750 mg via INTRAVENOUS
  Filled 2018-01-11: qty 150

## 2018-01-11 MED ORDER — VANCOMYCIN HCL 500 MG IV SOLR
500.0000 mg | Freq: Two times a day (BID) | INTRAVENOUS | Status: DC
  Filled 2018-01-11: qty 500

## 2018-01-11 MED ORDER — VANCOMYCIN HCL 500 MG IV SOLR
500.0000 mg | Freq: Two times a day (BID) | INTRAVENOUS | Status: DC
  Administered 2018-01-12 – 2018-01-13 (×3): 500 mg via INTRAVENOUS
  Filled 2018-01-11 (×3): qty 500

## 2018-01-11 MED ORDER — SODIUM CHLORIDE 0.9 % IV SOLN
INTRAVENOUS | Status: DC
  Administered 2018-01-11: 18:00:00 via INTRAVENOUS

## 2018-01-11 NOTE — Progress Notes (Addendum)
   Daily Progress Note   Assessment/Planning:   POD #2 s/p VPA L CFA and PFA for infected fem-pop graft   VAC adherent: VAC chg with Dr. Randie Heinzain on Monday  L AKA wound clean: continue wet-to-dry dressing BID  WBC decreasing appropriately.  Cont Vanco  Dr. Randie Heinzain to see patient on Monday   Subjective  - 2 Days Post-Op   No complaints, pain tolerable   Objective   Vitals:   01/10/18 0812 01/10/18 1719 01/10/18 2155 01/11/18 0600  BP: 110/88 111/73 111/74 113/67  Pulse: (!) 112 (!) 101 99 96  Resp: 18 16 18 18   Temp: 98.8 F (37.1 C) 98.8 F (37.1 C) 98.3 F (36.8 C) 98 F (36.7 C)  TempSrc: Oral Oral Oral Oral  SpO2: 100% 100% 100% 100%  Weight:   99 lb 6.8 oz (45.1 kg)   Height:         Intake/Output Summary (Last 24 hours) at 01/11/2018 0820 Last data filed at 01/11/2018 0748 Gross per 24 hour  Intake 876.08 ml  Output 1650 ml  Net -773.92 ml   VASC: L AKA wound clean, no bleeding, no drainage, no smell, L groin VAC adherent with serosang drainage in cannister   Laboratory   CBC CBC Latest Ref Rng & Units 01/11/2018 01/10/2018 01/09/2018  WBC 4.0 - 10.5 K/uL 10.5 19.4(H) -  Hemoglobin 13.0 - 17.0 g/dL 1.6(XW5.5(LL) 9.6(E8.4(L) 10.9(L)  Hematocrit 39.0 - 52.0 % 17.1(L) 25.7(L) 32.0(L)  Platelets 150 - 400 K/uL 308 454(H) -    BMET    Component Value Date/Time   NA 136 01/11/2018 0719   K 3.9 01/11/2018 0719   CL 107 01/11/2018 0719   CO2 25 01/11/2018 0719   GLUCOSE 103 (H) 01/11/2018 0719   BUN 11 01/11/2018 0719   CREATININE 0.68 01/11/2018 0719   CREATININE 0.92 11/13/2013 1031   CALCIUM 7.3 (L) 01/11/2018 0719   GFRNONAA >60 01/11/2018 0719   GFRNONAA >89 11/13/2013 1031   GFRAA >60 01/11/2018 0719   GFRAA >89 11/13/2013 1031     Leonides SakeBrian Chen, MD, FACS [facility] Office: [phone] Pager: [phone]  01/11/2018, 8:20 AM

## 2018-01-11 NOTE — Progress Notes (Signed)
[facility]                                                                              Patient Demographics  Stephen Bowen, is a 62 y.o. male, DOB - [birth year], ZOX:[id number]  Admit date - 01/09/2018   Admitting Physician Clydie Braun, MD  Outpatient Primary MD for the patient is Shelbie Ammons, MD  Outpatient specialists:   LOS - 1  days   Medical records reviewed and are as summarized below:    No chief complaint on file.      Brief summary   Patient is a 62 year old male with history of HTN; HLD; CAD; PVD s/p BL amputation; and chronic left inguinal wound, left femoropopliteal bypass with graft presenting from SNF with persistent bleeding from left inguinal wound.   Patient has had ongoing poor wound healing and continuing to have bleeding from his left AKA stump, poorly healing chronic left inguinal wound.  At skilled nursing facility he was found to be aggressively bleeding from his inguinal wound and was sent to the ER at [facility].  Due to the concern for hemorrhagic shock, needing vascular surgery consult, patient was transfused 2 units of packed RBCs , transferred to [facility] for further work-up.   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 -Vascular surgery consult was obtained, seen by Dr. Randie Heinz, patient underwent exploration of the left groin wound with angioplasty, wound VAC placed, postop day #2 -Patient received 2 units packed RBCs on 7/18  -Vascular surgery following.  Continue aspirin, hold Plavix for now until cleared by vascular surgery -Wound cultures growing MRSA, sensitive to vancomycin, will continue vanc -Hemoglobin down to 5.5 today, will transfuse 3 more units of packed RBCs  Active Problems:   Essential hypertension -BP  soft, decrease metoprolol to 12.5 mg twice daily    HLD (hyperlipidemia) - Continue Lipitor  Acute kidney injury Likely due to #1, Creatinine function improved, 0.6, hold IV fluids due to transfusion  Code Status: Full CODE STATUS DVT Prophylaxis: Bilateral amputation, cannot use SCDs, bleeding from the groin wound, hence cannot use prophylactic anticoagulation Family Communication: Discussed in detail with the patient, all imaging results, lab results explained to the patient    Disposition Plan: When cleared by vascular surgery  Time Spent in minutes   35 minutes  Procedures:   1. Exploration left groin groin wound 2.  Harvest left greater saphenous vein 3.  Patch angioplasty of left common femoral and profunda arteries    Consultants:   Vascular surgery  Antimicrobials:      Medications  Scheduled Meds: . sodium chloride   Intravenous Once  . aspirin  81 mg Oral Daily  . atorvastatin  40 mg Oral q1800  . docusate sodium  100 mg Oral BID  . folic acid  1 mg Oral Daily  . metoprolol tartrate  25 mg Oral BID  . mirtazapine  15 mg Oral QHS  .  polyethylene glycol  17 g Oral Daily  . senna-docusate  2 tablet Oral QHS  . sodium chloride flush  3 mL Intravenous Q12H   Continuous Infusions: . vancomycin    . vancomycin     PRN Meds:.acetaminophen **OR** acetaminophen, ondansetron **OR** ondansetron (ZOFRAN) IV, oxyCODONE-acetaminophen, pantoprazole, sodium chloride flush   Antibiotics   Anti-infectives (From admission, onward)   Start     Dose/Rate Route Frequency Ordered Stop   01/11/18 2200  vancomycin (VANCOCIN) 500 mg in sodium chloride 0.9 % 100 mL IVPB     500 mg 100 mL/hr over 60 Minutes Intravenous Every 12 hours 01/11/18 0929     01/11/18 1000  vancomycin (VANCOCIN) IVPB 750 mg/150 ml premix     750 mg 150 mL/hr over 60 Minutes Intravenous  Once 01/11/18 0929     01/10/18 0900  vancomycin (VANCOCIN) IVPB 750 mg/150 ml premix  Status:  Discontinued       750 mg 150 mL/hr over 60 Minutes Intravenous Every 24 hours 01/10/18 0842 01/11/18 0929   01/09/18 1030  ceFAZolin (ANCEF) IVPB 1 g/50 mL premix  Status:  Discontinued    Note to Pharmacy:  Send with pt to OR   1 g 100 mL/hr over 30 Minutes Intravenous On call 01/09/18 1027 01/10/18 0855        Subjective:   Stephen Bowen was seen and examined today.  Denies any specific complaints however not reliable historian.    No acute issues overnight.  No fevers chills, nausea vomiting or abdominal pain.  No chest pain or shortness of breath.  Objective:   Vitals:   01/11/18 0600 01/11/18 1009 01/11/18 1159 01/11/18 1230  BP: 113/67 102/66 109/64 94/68  Pulse: 96 100 100 96  Resp: 18 18 12 12   Temp: 98 F (36.7 C) 98.2 F (36.8 C) 98.2 F (36.8 C) 98.2 F (36.8 C)  TempSrc: Oral Oral Oral Oral  SpO2: 100% 100% 100% 100%  Weight:      Height:        Intake/Output Summary (Last 24 hours) at 01/11/2018 1249 Last data filed at 01/11/2018 1230 Gross per 24 hour  Intake 1209.41 ml  Output 1875 ml  Net -665.59 ml     Wt Readings from Last 3 Encounters:  01/10/18 45.1 kg (99 lb 6.8 oz)  12/25/17 44.9 kg (99 lb)  11/11/17 45 kg (99 lb 3.3 oz)     Exam   General: Alert and oriented x2  Eyes:   HEENT:   Cardiovascular: S1 S2 auscultated, Regular rate and rhythm. No pedal edema b/l  Respiratory: Clear to auscultation bilaterally, no wheezing, rales or rhonchi  Gastrointestinal: Soft, nontender, nondistended, + bowel sounds  Ext: bilateral AKA with dressing and wound VAC on the left lower extremity  Neuro: bilateral AKA  Musculoskeletal: No digital cyanosis, clubbing  Skin: No rashes  Psych: much more alert than yesterday however still confused  Data Reviewed:  I have personally reviewed following labs and imaging studies  Micro Results Recent Results (from the past 240 hour(s))  Blood Cultures x 2 sites     Status: None (Preliminary result)   Collection  Time: 01/09/18  9:32 AM  Result Value Ref Range Status   Specimen Description BLOOD LEFT HAND  Final   Special Requests   Final    BOTTLES DRAWN AEROBIC ONLY Blood Culture adequate volume   Culture   Final    NO GROWTH 1 DAY Performed at [facility], [address] [place], [place] [address]    Report Status PENDING  Incomplete  Blood Cultures x 2 sites     Status: None (Preliminary result)   Collection Time: 01/09/18 10:10 AM  Result Value Ref Range Status   Specimen Description BLOOD LEFT ARM  Final   Special Requests   Final    BOTTLES DRAWN AEROBIC ONLY Blood Culture adequate volume   Culture   Final    NO GROWTH 1 DAY Performed at [facility], [address] [place], [place] [address]    Report Status PENDING  Incomplete  MRSA PCR Screening     Status: Abnormal   Collection Time: 01/09/18  1:54 PM  Result Value Ref Range Status   MRSA by PCR POSITIVE (A) NEGATIVE Final    Comment:        The GeneXpert MRSA Assay (FDA approved for NASAL specimens only), is one component of a comprehensive MRSA colonization surveillance program. It is not intended to diagnose MRSA infection nor to guide or monitor treatment for MRSA infections. RESULT CALLED TO, READ BACK BY AND VERIFIED WITH: Donnita FallsH. Bowman RN 16:30 01/09/18 ([email or web address]) Performed at [facility], [address] [place], [place] [address]   Aerobic/Anaerobic Culture (surgical/deep wound)     Status: None (Preliminary result)   Collection Time: 01/09/18  3:59 PM  Result Value Ref Range Status   Specimen Description WOUND LEFT GROIN  Final   Special Requests PATIENT ON FOLLOWING ANCEF  Final   Gram Stain   Final    FEW WBC PRESENT,BOTH PMN AND MONONUCLEAR RARE GRAM POSITIVE COCCI Performed at [facility], [address] [place], [place] [address]    Culture   Final    MODERATE METHICILLIN RESISTANT STAPHYLOCOCCUS AUREUS NO ANAEROBES ISOLATED; CULTURE IN PROGRESS FOR 5 DAYS    Report Status  PENDING  Incomplete   Organism ID, Bacteria METHICILLIN RESISTANT STAPHYLOCOCCUS AUREUS  Final      Susceptibility   Methicillin resistant staphylococcus aureus - MIC*    CIPROFLOXACIN >=8 RESISTANT Resistant     ERYTHROMYCIN RESISTANT Resistant     GENTAMICIN <=0.5 SENSITIVE Sensitive     OXACILLIN >=4 RESISTANT Resistant     TETRACYCLINE >=16 RESISTANT Resistant     VANCOMYCIN 1 SENSITIVE Sensitive     TRIMETH/SULFA <=10 SENSITIVE Sensitive     CLINDAMYCIN RESISTANT Resistant     RIFAMPIN <=0.5 SENSITIVE Sensitive     Inducible Clindamycin POSITIVE Resistant     * MODERATE METHICILLIN RESISTANT STAPHYLOCOCCUS AUREUS    Radiology Reports No results found.  Lab Data:  CBC: Recent Labs  Lab 01/09/18 1019 01/09/18 1650 01/10/18 0613 01/11/18 0719  WBC 24.3*  --  19.4* 10.5  NEUTROABS 21.2*  --   --   --   HGB 12.8* 10.9* 8.4* 5.5*  HCT 38.4* 32.0* 25.7* 17.1*  MCV 85.1  --  88.9 91.4  PLT 631*  --  454* 308   Basic Metabolic Panel: Recent Labs  Lab 01/09/18 1019 01/09/18 1650 01/10/18 0613 01/11/18 0719  NA 132* 133* 133* 136  K 5.6* 4.8 5.1 3.9  CL 97*  --  100 107  CO2 18*  --  20* 25  GLUCOSE 112*  --  142* 103*  BUN 32*  --  34* 11  CREATININE 1.12  --  1.58* 0.68  CALCIUM 8.6*  --  7.7* 7.3*   GFR: Estimated Creatinine Clearance: 61.9 mL/min (by C-G formula based on SCr of  0.68 mg/dL). Liver Function Tests: No results for input(s): AST, ALT, ALKPHOS, BILITOT, PROT, ALBUMIN in the last 168 hours. No results for input(s): LIPASE, AMYLASE in the last 168 hours. No results for input(s): AMMONIA in the last 168 hours. Coagulation Profile: Recent Labs  Lab 01/09/18 1149  INR 1.07   Cardiac Enzymes: No results for input(s): CKTOTAL, CKMB, CKMBINDEX, TROPONINI in the last 168 hours. BNP (last 3 results) No results for input(s): PROBNP in the last 8760 hours. HbA1C: Recent Labs    01/09/18 1019  HGBA1C 6.0*   CBG: Recent Labs  Lab 01/09/18 0735    GLUCAP 119*   Lipid Profile: No results for input(s): CHOL, HDL, LDLCALC, TRIG, CHOLHDL, LDLDIRECT in the last 72 hours. Thyroid Function Tests: No results for input(s): TSH, T4TOTAL, FREET4, T3FREE, THYROIDAB in the last 72 hours. Anemia Panel: No results for input(s): VITAMINB12, FOLATE, FERRITIN, TIBC, IRON, RETICCTPCT in the last 72 hours. Urine analysis:    Component Value Date/Time   COLORURINE YELLOW 11/08/2017 0031   APPEARANCEUR CLEAR 11/08/2017 0031   LABSPEC 1.016 11/08/2017 0031   PHURINE 5.0 11/08/2017 0031   GLUCOSEU NEGATIVE 11/08/2017 0031   HGBUR SMALL (A) 11/08/2017 0031   BILIRUBINUR NEGATIVE 11/08/2017 0031   KETONESUR 20 (A) 11/08/2017 0031   PROTEINUR NEGATIVE 11/08/2017 0031   UROBILINOGEN 0.2 11/13/2013 1634   NITRITE NEGATIVE 11/08/2017 0031   LEUKOCYTESUR NEGATIVE 11/08/2017 0031     Mickelle Goupil M.D. [facility] 01/11/2018, 12:49 PM  Pager: [phone] Between 7am to 7pm - call Pager - [phone]  After 7pm go to [email or web address] - password [email or web address]  Call night coverage person covering after 7pm

## 2018-01-11 NOTE — Progress Notes (Signed)
Pharmacy Antibiotic Note  Stephen Bowen is a 62 y.o. male admitted on 01/09/2018 with bleeding from left inguinal wound.  Pharmacy has been consulted for vancomycin dosing for infected left femoral bypass s/p excision on 7/18.  On abx for infected left femoral bypass, afeb, WBC down to wnl.  Plan: Increase vancomycin to 500mg  IV Q12h Monitor clinical picture, renal function, VT prn F/U staph susceptibilities, abx deescalation / LOT  Height: 5\' 6"  (167.6 cm) Weight: 99 lb 6.8 oz (45.1 kg) IBW/kg (Calculated) : 63.8  Temp (24hrs), Avg:98.4 F (36.9 C), Min:98 F (36.7 C), Max:98.8 F (37.1 C)  Recent Labs  Lab 01/09/18 1019 01/10/18 0613 01/11/18 0719  WBC 24.3* 19.4* 10.5  CREATININE 1.12 1.58* 0.68    Estimated Creatinine Clearance: 61.9 mL/min (by C-G formula based on SCr of 0.68 mg/dL).    No Known Allergies  Antimicrobials this admission: Cefazolin 2 g pre-op on 7/18 vancomycin 7/19 >>   Dose adjustments this admission:   Microbiology results: 7/18 left groin wound: staph aureus (pending) 7/18 MRSA PCR positive 7/18 BCx: sent  Thank you for allowing pharmacy to be a part of this patient's care.  Enzo BiNathan Harjit Douds, PharmD, BCPS Clinical Pharmacist Phone number [phone] 01/11/2018 9:30 AM

## 2018-01-11 NOTE — Progress Notes (Signed)
CRITICAL VALUE ALERT  Critical Value:  5.5 hemoglobin  Date & Time Notied:  [phone]  Provider Notified: Dr. Isidoro Donningai  Orders Received/Actions taken: Will continue to monitor patient condition and implement MD orders. Bess KindsGWALTNEY, Xavious Sharrar B, RN

## 2018-01-12 DIAGNOSIS — I10 Essential (primary) hypertension: Secondary | ICD-10-CM

## 2018-01-12 LAB — TYPE AND SCREEN
ABO/RH(D): A POS
ANTIBODY SCREEN: NEGATIVE
UNIT DIVISION: 0
Unit division: 0
Unit division: 0
Unit division: 0

## 2018-01-12 LAB — CBC
HCT: 36.5 % — ABNORMAL LOW (ref 39.0–52.0)
Hemoglobin: 12.3 g/dL — ABNORMAL LOW (ref 13.0–17.0)
MCH: 29.8 pg (ref 26.0–34.0)
MCHC: 33.7 g/dL (ref 30.0–36.0)
MCV: 88.4 fL (ref 78.0–100.0)
Platelets: 257 10*3/uL (ref 150–400)
RBC: 4.13 MIL/uL — ABNORMAL LOW (ref 4.22–5.81)
RDW: 14.4 % (ref 11.5–15.5)
WBC: 11.3 10*3/uL — ABNORMAL HIGH (ref 4.0–10.5)

## 2018-01-12 LAB — BPAM RBC
BLOOD PRODUCT EXPIRATION DATE: 201908062359
Blood Product Expiration Date: 201908062359
Blood Product Expiration Date: 201908062359
Blood Product Expiration Date: 201908062359
ISSUE DATE / TIME: 201907181509
ISSUE DATE / TIME: 201907201211
ISSUE DATE / TIME: 201907201532
ISSUE DATE / TIME: 201907201839
UNIT TYPE AND RH: 6200
UNIT TYPE AND RH: 6200
Unit Type and Rh: 6200
Unit Type and Rh: 6200

## 2018-01-12 LAB — BASIC METABOLIC PANEL
Anion gap: 8 (ref 5–15)
BUN: 8 mg/dL (ref 8–23)
CALCIUM: 7.9 mg/dL — AB (ref 8.9–10.3)
CHLORIDE: 105 mmol/L (ref 98–111)
CO2: 24 mmol/L (ref 22–32)
CREATININE: 0.59 mg/dL — AB (ref 0.61–1.24)
GFR calc Af Amer: >60 mL/min (ref >60)
GFR calc non Af Amer: >60 mL/min (ref >60)
Glucose, Bld: 85 mg/dL (ref 70–99)
Potassium: 3.8 mmol/L (ref 3.5–5.1)
Sodium: 137 mmol/L (ref 135–145)

## 2018-01-12 LAB — HEMOGLOBIN AND HEMATOCRIT, BLOOD
HCT: 38.5 % — ABNORMAL LOW (ref 39.0–52.0)
HEMOGLOBIN: 12.9 g/dL — AB (ref 13.0–17.0)

## 2018-01-12 NOTE — Progress Notes (Signed)
[facility]                                                                              Patient Demographics  Stephen Bowen, is a 62 y.o. male, DOB - [birth year], ZOX:[id number]  Admit date - 01/09/2018   Admitting Physician Clydie Braun, MD  Outpatient Primary MD for the patient is Shelbie Ammons, MD  Outpatient specialists:   LOS - 2  days   Medical records reviewed and are as summarized below:    No chief complaint on file.      Brief summary   Patient is a 63 year old male with history of HTN; HLD; CAD; PVD s/p BL amputation; and chronic left inguinal wound, left femoropopliteal bypass with graft presenting from SNF with persistent bleeding from left inguinal wound.   Patient has had ongoing poor wound healing and continuing to have bleeding from his left AKA stump, poorly healing chronic left inguinal wound.  At skilled nursing facility he was found to be aggressively bleeding from his inguinal wound and was sent to the ER at [facility].  Due to the concern for hemorrhagic shock, needing vascular surgery consult, patient was transfused 2 units of packed RBCs , transferred to [facility] for further work-up.   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 -Vascular surgery consult was obtained, seen by Dr. Randie Heinz, patient underwent exploration of the left groin wound with angioplasty, wound VAC placed, postop day #3 -Patient received 2 units packed RBCs on 7/18.  Hemoglobin was 5.5 on 7/20, received 3 units of packed RBCs, hemoglobin 12.3 today -Vascular surgery following.  Continue aspirin, hold Plavix for now until cleared by vascular surgery -Wound cultures growing MRSA, continue vancomycin.  Blood cultures negative to date  Active Problems:   Essential  hypertension -BP now stable, continue metoprolol 12.5 mg twice a day     HLD (hyperlipidemia) - Continue Lipitor  Acute kidney injury Likely due to #1, Creatinine function improved, 0.5   Code Status: Full CODE STATUS DVT Prophylaxis: Bilateral amputation, cannot use SCDs, bleeding from the groin wound, hence cannot use prophylactic anticoagulation Family Communication: Discussed in detail with the patient, all imaging results, lab results explained to the patient    Disposition Plan: When cleared by vascular surgery, will need skilled nursing facility  Time Spent in minutes   35 minutes  Procedures:   1. Exploration left groin groin wound 2.  Harvest left greater saphenous vein 3.  Patch angioplasty of left common femoral and profunda arteries    Consultants:   Vascular surgery  Antimicrobials:      Medications  Scheduled Meds: . aspirin  81 mg Oral Daily  . atorvastatin  40 mg Oral q1800  . docusate sodium  100 mg Oral BID  . folic acid  1 mg Oral Daily  . metoprolol tartrate  12.5 mg Oral BID  . mirtazapine  15 mg Oral QHS  . polyethylene  glycol  17 g Oral Daily  . senna-docusate  2 tablet Oral QHS  . sodium chloride flush  3 mL Intravenous Q12H   Continuous Infusions: . sodium chloride 10 mL/hr at 01/11/18 1748  . vancomycin 500 mg (01/12/18 0514)   PRN Meds:.acetaminophen **OR** acetaminophen, ondansetron **OR** ondansetron (ZOFRAN) IV, oxyCODONE-acetaminophen, pantoprazole, sodium chloride flush   Antibiotics   Anti-infectives (From admission, onward)   Start     Dose/Rate Route Frequency Ordered Stop   01/12/18 0600  vancomycin (VANCOCIN) 500 mg in sodium chloride 0.9 % 100 mL IVPB     500 mg 100 mL/hr over 60 Minutes Intravenous Every 12 hours 01/11/18 1746     01/11/18 2200  vancomycin (VANCOCIN) 500 mg in sodium chloride 0.9 % 100 mL IVPB  Status:  Discontinued     500 mg 100 mL/hr over 60 Minutes Intravenous Every 12 hours 01/11/18 0929  01/11/18 1746   01/11/18 1000  vancomycin (VANCOCIN) IVPB 750 mg/150 ml premix     750 mg 150 mL/hr over 60 Minutes Intravenous  Once 01/11/18 0929 01/12/18 0700   01/10/18 0900  vancomycin (VANCOCIN) IVPB 750 mg/150 ml premix  Status:  Discontinued     750 mg 150 mL/hr over 60 Minutes Intravenous Every 24 hours 01/10/18 0842 01/11/18 0929   01/09/18 1030  ceFAZolin (ANCEF) IVPB 1 g/50 mL premix  Status:  Discontinued    Note to Pharmacy:  Send with pt to OR   1 g 100 mL/hr over 30 Minutes Intravenous On call 01/09/18 1027 01/10/18 0855        Subjective:   Stephen Bowen was seen and examined today.  No complaints today.  No fevers or chills.  No acute issues overnight.  Denies any nausea vomiting or abdominal pain.  No chest pain or shortness of breath.  Objective:   Vitals:   01/11/18 1824 01/11/18 1856 01/11/18 2215 01/12/18 0446  BP: 109/67 99/62 134/81 129/77  Pulse: (!) 104 94 82 75  Resp: 12 14 18 12   Temp: 98.2 F (36.8 C) 98.4 F (36.9 C) 97.9 F (36.6 C) 98.7 F (37.1 C)  TempSrc: Oral Oral Oral Oral  SpO2: 100% 99% 98% 98%  Weight:      Height:        Intake/Output Summary (Last 24 hours) at 01/12/2018 1152 Last data filed at 01/12/2018 0900 Gross per 24 hour  Intake 1295.33 ml  Output 2575 ml  Net -1279.67 ml     Wt Readings from Last 3 Encounters:  01/10/18 45.1 kg (99 lb 6.8 oz)  12/25/17 44.9 kg (99 lb)  11/11/17 45 kg (99 lb 3.3 oz)     Exam  Physical Exam General: Alert and awake, NAD Eyes: HEENT:   Cardiovascular: S1 S2 auscultated, RRR Respiratory: Clear to auscultation bilaterally, no wheezing, rales or rhonchi Gastrointestinal: Soft, nontender, nondistended, + bowel sounds Ext: bilateral AKA, wound VAC Neuro: no new deficits Musculoskeletal: No digital cyanosis, clubbing Skin: No rashes  Psych: alert and awake  Data Reviewed:  I have personally reviewed following labs and imaging studies  Micro Results Recent Results (from  the past 240 hour(s))  Blood Cultures x 2 sites     Status: None (Preliminary result)   Collection Time: 01/09/18  9:32 AM  Result Value Ref Range Status   Specimen Description BLOOD LEFT HAND  Final   Special Requests   Final    BOTTLES DRAWN AEROBIC ONLY Blood Culture adequate volume   Culture   Final  NO GROWTH 2 DAYS Performed at [facility], [address] [place], [place] [address]    Report Status PENDING  Incomplete  Blood Cultures x 2 sites     Status: None (Preliminary result)   Collection Time: 01/09/18 10:10 AM  Result Value Ref Range Status   Specimen Description BLOOD LEFT ARM  Final   Special Requests   Final    BOTTLES DRAWN AEROBIC ONLY Blood Culture adequate volume   Culture   Final    NO GROWTH 2 DAYS Performed at [facility], [address] [place], [place] [address]    Report Status PENDING  Incomplete  MRSA PCR Screening     Status: Abnormal   Collection Time: 01/09/18  1:54 PM  Result Value Ref Range Status   MRSA by PCR POSITIVE (A) NEGATIVE Final    Comment:        The GeneXpert MRSA Assay (FDA approved for NASAL specimens only), is one component of a comprehensive MRSA colonization surveillance program. It is not intended to diagnose MRSA infection nor to guide or monitor treatment for MRSA infections. RESULT CALLED TO, READ BACK BY AND VERIFIED WITH: Donnita FallsH. Bowman RN 16:30 01/09/18 ([email or web address]) Performed at [facility], [address] [place], [place] [address]   Aerobic/Anaerobic Culture (surgical/deep wound)     Status: None (Preliminary result)   Collection Time: 01/09/18  3:59 PM  Result Value Ref Range Status   Specimen Description WOUND LEFT GROIN  Final   Special Requests PATIENT ON FOLLOWING ANCEF  Final   Gram Stain   Final    FEW WBC PRESENT,BOTH PMN AND MONONUCLEAR RARE GRAM POSITIVE COCCI Performed at [facility], [address] [place], [place] [address]    Culture   Final    MODERATE METHICILLIN  RESISTANT STAPHYLOCOCCUS AUREUS NO ANAEROBES ISOLATED; CULTURE IN PROGRESS FOR 5 DAYS    Report Status PENDING  Incomplete   Organism ID, Bacteria METHICILLIN RESISTANT STAPHYLOCOCCUS AUREUS  Final      Susceptibility   Methicillin resistant staphylococcus aureus - MIC*    CIPROFLOXACIN >=8 RESISTANT Resistant     ERYTHROMYCIN RESISTANT Resistant     GENTAMICIN <=0.5 SENSITIVE Sensitive     OXACILLIN >=4 RESISTANT Resistant     TETRACYCLINE >=16 RESISTANT Resistant     VANCOMYCIN 1 SENSITIVE Sensitive     TRIMETH/SULFA <=10 SENSITIVE Sensitive     CLINDAMYCIN RESISTANT Resistant     RIFAMPIN <=0.5 SENSITIVE Sensitive     Inducible Clindamycin POSITIVE Resistant     * MODERATE METHICILLIN RESISTANT STAPHYLOCOCCUS AUREUS    Radiology Reports No results found.  Lab Data:  CBC: Recent Labs  Lab 01/09/18 1019 01/09/18 1650 01/10/18 0613 01/11/18 0719 01/12/18 0051 01/12/18 0406  WBC 24.3*  --  19.4* 10.5  --  11.3*  NEUTROABS 21.2*  --   --   --   --   --   HGB 12.8* 10.9* 8.4* 5.5* 12.9* 12.3*  HCT 38.4* 32.0* 25.7* 17.1* 38.5* 36.5*  MCV 85.1  --  88.9 91.4  --  88.4  PLT 631*  --  454* 308  --  257   Basic Metabolic Panel: Recent Labs  Lab 01/09/18 1019 01/09/18 1650 01/10/18 0613 01/11/18 0719 01/12/18 0406  NA 132* 133* 133* 136 137  K 5.6* 4.8 5.1 3.9 3.8  CL 97*  --  100 107 105  CO2 18*  --  20* 25 24  GLUCOSE 112*  --  142* 103* 85  BUN 32*  --  34* 11 8  CREATININE 1.12  --  1.58* 0.68 0.59*  CALCIUM 8.6*  --  7.7* 7.3* 7.9*   GFR: Estimated Creatinine Clearance: 61.9 mL/min (A) (by C-G formula based on SCr of 0.59 mg/dL (L)). Liver Function Tests: No results for input(s): AST, ALT, ALKPHOS, BILITOT, PROT, ALBUMIN in the last 168 hours. No results for input(s): LIPASE, AMYLASE in the last 168 hours. No results for input(s): AMMONIA in the last 168 hours. Coagulation Profile: Recent Labs  Lab 01/09/18 1149  INR 1.07   Cardiac Enzymes: No  results for input(s): CKTOTAL, CKMB, CKMBINDEX, TROPONINI in the last 168 hours. BNP (last 3 results) No results for input(s): PROBNP in the last 8760 hours. HbA1C: No results for input(s): HGBA1C in the last 72 hours. CBG: Recent Labs  Lab 01/09/18 0735  GLUCAP 119*   Lipid Profile: No results for input(s): CHOL, HDL, LDLCALC, TRIG, CHOLHDL, LDLDIRECT in the last 72 hours. Thyroid Function Tests: No results for input(s): TSH, T4TOTAL, FREET4, T3FREE, THYROIDAB in the last 72 hours. Anemia Panel: No results for input(s): VITAMINB12, FOLATE, FERRITIN, TIBC, IRON, RETICCTPCT in the last 72 hours. Urine analysis:    Component Value Date/Time   COLORURINE YELLOW 11/08/2017 0031   APPEARANCEUR CLEAR 11/08/2017 0031   LABSPEC 1.016 11/08/2017 0031   PHURINE 5.0 11/08/2017 0031   GLUCOSEU NEGATIVE 11/08/2017 0031   HGBUR SMALL (A) 11/08/2017 0031   BILIRUBINUR NEGATIVE 11/08/2017 0031   KETONESUR 20 (A) 11/08/2017 0031   PROTEINUR NEGATIVE 11/08/2017 0031   UROBILINOGEN 0.2 11/13/2013 1634   NITRITE NEGATIVE 11/08/2017 0031   LEUKOCYTESUR NEGATIVE 11/08/2017 0031     Idelia Caudell M.D. [facility] 01/12/2018, 11:52 AM  Pager: [phone] Between 7am to 7pm - call Pager - [phone]  After 7pm go to [email or web address] - password [email or web address]  Call night coverage person covering after 7pm

## 2018-01-13 DIAGNOSIS — T8744 Infection of amputation stump, left lower extremity: Secondary | ICD-10-CM

## 2018-01-13 DIAGNOSIS — Z89612 Acquired absence of left leg above knee: Secondary | ICD-10-CM

## 2018-01-13 DIAGNOSIS — Z978 Presence of other specified devices: Secondary | ICD-10-CM

## 2018-01-13 DIAGNOSIS — Z89611 Acquired absence of right leg above knee: Secondary | ICD-10-CM

## 2018-01-13 DIAGNOSIS — T8579XA Infection and inflammatory reaction due to other internal prosthetic devices, implants and grafts, initial encounter: Secondary | ICD-10-CM

## 2018-01-13 DIAGNOSIS — I739 Peripheral vascular disease, unspecified: Secondary | ICD-10-CM

## 2018-01-13 DIAGNOSIS — Z87891 Personal history of nicotine dependence: Secondary | ICD-10-CM

## 2018-01-13 DIAGNOSIS — B9562 Methicillin resistant Staphylococcus aureus infection as the cause of diseases classified elsewhere: Secondary | ICD-10-CM

## 2018-01-13 LAB — CBC
HCT: 39.7 % (ref 39.0–52.0)
Hemoglobin: 13 g/dL (ref 13.0–17.0)
MCH: 29.8 pg (ref 26.0–34.0)
MCHC: 32.7 g/dL (ref 30.0–36.0)
MCV: 91.1 fL (ref 78.0–100.0)
PLATELETS: 300 10*3/uL (ref 150–400)
RBC: 4.36 MIL/uL (ref 4.22–5.81)
RDW: 15.6 % — ABNORMAL HIGH (ref 11.5–15.5)
WBC: 13.5 10*3/uL — ABNORMAL HIGH (ref 4.0–10.5)

## 2018-01-13 LAB — BASIC METABOLIC PANEL
Anion gap: 8 (ref 5–15)
BUN: 7 mg/dL — AB (ref 8–23)
CHLORIDE: 103 mmol/L (ref 98–111)
CO2: 26 mmol/L (ref 22–32)
Calcium: 8.3 mg/dL — ABNORMAL LOW (ref 8.9–10.3)
Creatinine, Ser: 0.65 mg/dL (ref 0.61–1.24)
GFR calc Af Amer: >60 mL/min (ref >60)
GFR calc non Af Amer: >60 mL/min (ref >60)
Glucose, Bld: 97 mg/dL (ref 70–99)
Potassium: 3.9 mmol/L (ref 3.5–5.1)
Sodium: 137 mmol/L (ref 135–145)

## 2018-01-13 MED ORDER — RIFAMPIN 300 MG PO CAPS
300.0000 mg | ORAL_CAPSULE | Freq: Two times a day (BID) | ORAL | Status: DC
  Administered 2018-01-13 – 2018-01-14 (×4): 300 mg via ORAL
  Filled 2018-01-13 (×4): qty 1

## 2018-01-13 MED ORDER — ENSURE ENLIVE PO LIQD
237.0000 mL | Freq: Two times a day (BID) | ORAL | Status: DC
  Administered 2018-01-13 – 2018-01-14 (×2): 237 mL via ORAL

## 2018-01-13 MED ORDER — SULFAMETHOXAZOLE-TRIMETHOPRIM 800-160 MG PO TABS
1.0000 | ORAL_TABLET | Freq: Two times a day (BID) | ORAL | Status: DC
  Administered 2018-01-13 – 2018-01-14 (×4): 1 via ORAL
  Filled 2018-01-13 (×4): qty 1

## 2018-01-13 MED ORDER — ADULT MULTIVITAMIN W/MINERALS CH
1.0000 | ORAL_TABLET | Freq: Every day | ORAL | Status: DC
  Administered 2018-01-13 – 2018-01-14 (×2): 1 via ORAL
  Filled 2018-01-13 (×2): qty 1

## 2018-01-13 NOTE — Progress Notes (Signed)
Nutrition Follow-up  DOCUMENTATION CODES:   Non-severe (moderate) malnutrition in context of chronic illness  INTERVENTION:   Ensure Enlive po BID, each supplement provides 350 kcal and 20 grams of protein  Add MVI daily  NUTRITION DIAGNOSIS:   Moderate Malnutrition related to chronic illness(PAD, CAD, noncompliance) as evidenced by mild fat depletion, mild muscle depletion, moderate muscle depletion.  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  7/18 Exploration of left groin wound with angioplasty, wound VAC placement  Pt ate 50% at lunch today, recorded po intake 25% of all meal yesterday  Weight up slightly but relatively stable  Labs: reviewed Meds: remeron  Diet Order:   Diet Order           Diet Heart Room service appropriate? Yes; Fluid consistency: Thin  Diet effective now          EDUCATION NEEDS:   No education needs have been identified at this time  Skin:  Skin Assessment: Skin Integrity Issues: Skin Integrity Issues:: Other (Comment), Incisions Incisions: L AKA site leaking fluid Other: L thigh/groin wound -- bleeding  Last BM:  7/21  Height:   Ht Readings from Last 1 Encounters:  01/09/18 5' 6" (1.676 m)    Weight:   Wt Readings from Last 1 Encounters:  01/12/18 100 lb 15.5 oz (45.8 kg)    Ideal Body Weight:  54.2 kg  BMI:  Body mass index is 16.3 kg/m.  Estimated Nutritional Needs:   Kcal:  1600-1800  Protein:  80-100 gm  Fluid:  >/=1.6 L   Cate  MS, RD, LDN, CNSC [phone] Pager   [phone] Weekend/On-Call Pager  

## 2018-01-13 NOTE — Consult Note (Addendum)
WOC Nurse wound consult note Reason for Consult: Vascular team is following for assessment and plan of care and [facility] to left groin. Wound type: Full thickness post-op wound Measurement: 8X1X.3cm Wound bed: red wound bed with mod amt bloody drainage; no active bleeding Periwound: Intact skin surrounding Dressing procedure/placement/frequency: Pt declined pain meds prior to the procedure, but then had mod amt discomfort with dressing change and was medicated after the procedure was completed.  Applied Mepitel contact layer to decrease discomfort with subsequent dressing changes; then barrier ring around wound edges to attempt to maintain a seal, then one piece black foam to 125mm cont suction.  WOC will plan to change Vac dressing Q M/W/F while in the hospital. Pt will need Vac paperwork completed by the vascular team for approval of insurance and care manager will need to begin process for obtaining a negative pressure machine and supplies after discharge. Cammie Mcgeeawn Maris Abascal MSN, RN, CWOCN, [place]CN-AP, CNS [phone]

## 2018-01-13 NOTE — Consult Note (Signed)
[facility]       Reason for Consult: graft infection    Referring Physician: Dr. Isidoro Donningai  Principal Problem:   Hemorrhage from wound Active Problems:   Essential hypertension   HLD (hyperlipidemia)   Non-healing wound of amputation stump (HCC)   Acute renal failure (ARF) (HCC)   Wound infection   . aspirin  81 mg Oral Daily  . atorvastatin  40 mg Oral q1800  . docusate sodium  100 mg Oral BID  . folic acid  1 mg Oral Daily  . metoprolol tartrate  12.5 mg Oral BID  . mirtazapine  15 mg Oral QHS  . polyethylene glycol  17 g Oral Daily  . senna-docusate  2 tablet Oral QHS  . sodium chloride flush  3 mL Intravenous Q12H    Recommendations: Bactrim and rifampin for 14 more days through August 4  Assessment: He has a graft infection s/p debridement by vascular surgery and culture with MRSA.    We are available for follow up if needed.  Will otherwise not follow up. Thanks  Antibiotics: Vancomycin day 4  HPI: Stephen Bowen is a 62 y.o. 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  Went to the OR by Dr. Randie Heinzain and found a grossly infected PTFE bypass and culture as above.  It also communicated with the AKA site and the graft was removed and a saphenous vein patch was placed and wound VAC now in place.  He has been on vancomycin.  No associated rash, diarrhea.  Significant pain of his stump and around the VAC.     Review of Systems:  Constitutional: negative for fevers, chills and malaise Gastrointestinal: negative for nausea and diarrhea Integument/breast: negative for rash All other systems reviewed and are negative    Past Medical History:  Diagnosis Date  . Coronary artery disease   . Encephalopathy acute 07/2016  . HTN (hypertension)   . Hyperlipemia   . Myocardial infarction (HCC)   . Non-healing surgical wound    left AKA  . Noncompliance   . PAD (peripheral artery disease) (HCC)     a.  s/p left femoral to PT artery bypass with propaten on 02/16/16.    Social History   Tobacco Use  . Smoking status: Former Smoker    Packs/day: 3.00    Years: 40.00    Pack years: 120.00  . Smokeless tobacco: Never Used  . Tobacco comment: cutting back amount he is buying  Substance Use Topics  . Alcohol use: Not Currently    Alcohol/week: 8.4 oz    Types: 14 Cans of beer per week    Comment: Drinks a 40 oz "tallboy" daily   [id number]  I quitdrinking  . Drug use: No    Family History  Problem Relation Age of Onset  . Cancer Mother        deceased in 6970s   . Heart attack Mother        diseased  . Heart disease Father   . Stroke Sister        age 7850s  . Heart disease Brother        pacemaker in his 5050s    No Known Allergies  Physical Exam: Constitutional: in no apparent distress and alert  Vitals:   01/13/18 0545 01/13/18 0900  BP: 138/87 132/80  Pulse: 85 87  Resp: 16 18  Temp: 98.8 F (37.1 C)  97.9 F (36.6 C)  SpO2: 97% 98%   EYES: anicteric ENMT: no thrush Cardiovascular: Cor RRR Respiratory: CTA B; normal respiratory effort GI: Bowel sounds are normal, liver is not enlarged, spleen is not enlarged Musculoskeletal: bilateral AKA; left stump with VAC in place, no surrounding erythema, significant pain with touch Skin: negatives: no rash Hematologic: no cervical lad  Lab Results  Component Value Date   WBC 13.5 (H) 01/13/2018   HGB 13.0 01/13/2018   HCT 39.7 01/13/2018   MCV 91.1 01/13/2018   PLT 300 01/13/2018    Lab Results  Component Value Date   CREATININE 0.65 01/13/2018   BUN 7 (L) 01/13/2018   NA 137 01/13/2018   K 3.9 01/13/2018   CL 103 01/13/2018   CO2 26 01/13/2018    Lab Results  Component Value Date   ALT 19 11/11/2017   AST 15 11/11/2017   ALKPHOS 68 11/11/2017     Microbiology: Recent Results (from the past 240 hour(s))  Blood Cultures x 2 sites     Status: None (Preliminary result)   Collection Time: 01/09/18  9:32  AM  Result Value Ref Range Status   Specimen Description BLOOD LEFT HAND  Final   Special Requests   Final    BOTTLES DRAWN AEROBIC ONLY Blood Culture adequate volume   Culture   Final    NO GROWTH 3 DAYS Performed at [facility], [address], [place], [place] [address]    Report Status PENDING  Incomplete  Blood Cultures x 2 sites     Status: None (Preliminary result)   Collection Time: 01/09/18 10:10 AM  Result Value Ref Range Status   Specimen Description BLOOD LEFT ARM  Final   Special Requests   Final    BOTTLES DRAWN AEROBIC ONLY Blood Culture adequate volume   Culture   Final    NO GROWTH 3 DAYS Performed at [facility], [address], [place], [place] [address]    Report Status PENDING  Incomplete  MRSA PCR Screening     Status: Abnormal   Collection Time: 01/09/18  1:54 PM  Result Value Ref Range Status   MRSA by PCR POSITIVE (A) NEGATIVE Final    Comment:        The GeneXpert MRSA Assay (FDA approved for NASAL specimens only), is one component of a comprehensive MRSA colonization surveillance program. It is not intended to diagnose MRSA infection nor to guide or monitor treatment for MRSA infections. RESULT CALLED TO, READ BACK BY AND VERIFIED WITH: Donnita Falls RN 16:30 01/09/18 ([email or web address]) Performed at [facility], [address] [place], [place] [address]   Aerobic/Anaerobic Culture (surgical/deep wound)     Status: None (Preliminary result)   Collection Time: 01/09/18  3:59 PM  Result Value Ref Range Status   Specimen Description WOUND LEFT GROIN  Final   Special Requests PATIENT ON FOLLOWING ANCEF  Final   Gram Stain   Final    FEW WBC PRESENT,BOTH PMN AND MONONUCLEAR RARE GRAM POSITIVE COCCI Performed at [facility], [address], [place], [place] [address]    Culture   Final    MODERATE METHICILLIN RESISTANT STAPHYLOCOCCUS AUREUS NO ANAEROBES ISOLATED; CULTURE IN PROGRESS FOR 5 DAYS    Report Status PENDING  Incomplete    Organism ID, Bacteria METHICILLIN RESISTANT STAPHYLOCOCCUS AUREUS  Final      Susceptibility   Methicillin resistant staphylococcus aureus - MIC*    CIPROFLOXACIN >=8  RESISTANT Resistant     ERYTHROMYCIN RESISTANT Resistant     GENTAMICIN <=0.5 SENSITIVE Sensitive     OXACILLIN >=4 RESISTANT Resistant     TETRACYCLINE >=16 RESISTANT Resistant     VANCOMYCIN 1 SENSITIVE Sensitive     TRIMETH/SULFA <=10 SENSITIVE Sensitive     CLINDAMYCIN RESISTANT Resistant     RIFAMPIN <=0.5 SENSITIVE Sensitive     Inducible Clindamycin POSITIVE Resistant     * MODERATE METHICILLIN RESISTANT STAPHYLOCOCCUS AUREUS    Gardiner Barefoot, MD [facility] [email or web address] [phone] pager  [phone] cell 01/13/2018, 10:35 AM

## 2018-01-13 NOTE — Progress Notes (Addendum)
[facility]                                                                              Patient Demographics  Stephen Bowen, is a 62 y.o. male, DOB - 06-10-1956, ZOX:[id number]  Admit date - 01/09/2018   Admitting Physician Clydie Braun, MD  Outpatient Primary MD for the patient is Shelbie Ammons, MD  Outpatient specialists:   LOS - 3  days   Medical records reviewed and are as summarized below:    No chief complaint on file.      Brief summary   Patient is a 62 year old male with history of HTN; HLD; CAD; PVD s/p BL amputation; and chronic left inguinal wound, left femoropopliteal bypass with graft presenting from SNF with persistent bleeding from left inguinal wound.   Patient has had ongoing poor wound healing and continuing to have bleeding from his left AKA stump, poorly healing chronic left inguinal wound.  At skilled nursing facility he was found to be aggressively bleeding from his inguinal wound and was sent to the ER at [facility].  Due to the concern for hemorrhagic shock, needing vascular surgery consult, patient was transfused 2 units of packed RBCs , transferred to [facility] for further work-up.   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 -Vascular surgery consult was obtained, seen by Dr. Randie Heinz, patient underwent exploration of the left groin wound with angioplasty, wound VAC placed, postop day #4 -Patient received 2 units packed RBCs on 7/18.  Hemoglobin was 5.5 on 7/20, received 3 units of packed RBCs, H&H stable 13.0 -Pending vascular surgery follow-up today for further course of management, disposition - Wound cultures growing MRSA. -ID consult obtained, recommended Bactrim and rifampin for 14 more days to August 4, vancomycin  discontinued  Active Problems:   Essential hypertension -BP stable, continue metoprolol 12.5 mg twice a day.  12.5 mg twice a day     HLD (hyperlipidemia) - Continue Lipitor  Acute kidney injury Likely due to #1, Creatinine function improved, 0.5   Code Status: Full CODE STATUS DVT Prophylaxis: Bilateral amputation, cannot use SCDs, bleeding from the groin wound, hence cannot use prophylactic anticoagulation Family Communication: Discussed in detail with the patient, all imaging results, lab results explained to the patient    Disposition Plan: When cleared by vascular surgery, will need skilled nursing facility  Time Spent in minutes   35 minutes  Procedures:   1. Exploration left groin groin wound 2.  Harvest left greater saphenous vein 3.  Patch angioplasty of left common femoral and profunda arteries    Consultants:   Vascular surgery  Antimicrobials:      Medications  Scheduled Meds: . aspirin  81 mg Oral Daily  . atorvastatin  40 mg Oral q1800  . docusate sodium  100 mg Oral BID  . folic acid  1 mg Oral Daily  . metoprolol tartrate  12.5 mg Oral BID  .  mirtazapine  15 mg Oral QHS  . polyethylene glycol  17 g Oral Daily  . rifampin  300 mg Oral Q12H  . senna-docusate  2 tablet Oral QHS  . sodium chloride flush  3 mL Intravenous Q12H  . sulfamethoxazole-trimethoprim  1 tablet Oral Q12H   Continuous Infusions: . sodium chloride 10 mL/hr at 01/11/18 1748   PRN Meds:.acetaminophen **OR** acetaminophen, ondansetron **OR** ondansetron (ZOFRAN) IV, oxyCODONE-acetaminophen, pantoprazole, sodium chloride flush   Antibiotics   Anti-infectives (From admission, onward)   Start     Dose/Rate Route Frequency Ordered Stop   01/13/18 1100  sulfamethoxazole-trimethoprim (BACTRIM DS,SEPTRA DS) 800-160 MG per tablet 1 tablet     1 tablet Oral Every 12 hours 01/13/18 1055 01/27/18 0959   01/13/18 1100  rifampin (RIFADIN) capsule 300 mg     300 mg Oral Every 12 hours  01/13/18 1055 01/27/18 0959   01/12/18 0600  vancomycin (VANCOCIN) 500 mg in sodium chloride 0.9 % 100 mL IVPB  Status:  Discontinued     500 mg 100 mL/hr over 60 Minutes Intravenous Every 12 hours 01/11/18 1746 01/13/18 1055   01/11/18 2200  vancomycin (VANCOCIN) 500 mg in sodium chloride 0.9 % 100 mL IVPB  Status:  Discontinued     500 mg 100 mL/hr over 60 Minutes Intravenous Every 12 hours 01/11/18 0929 01/11/18 1746   01/11/18 1000  vancomycin (VANCOCIN) IVPB 750 mg/150 ml premix     750 mg 150 mL/hr over 60 Minutes Intravenous  Once 01/11/18 0929 01/12/18 0700   01/10/18 0900  vancomycin (VANCOCIN) IVPB 750 mg/150 ml premix  Status:  Discontinued     750 mg 150 mL/hr over 60 Minutes Intravenous Every 24 hours 01/10/18 0842 01/11/18 0929   01/09/18 1030  ceFAZolin (ANCEF) IVPB 1 g/50 mL premix  Status:  Discontinued    Note to Pharmacy:  Send with pt to OR   1 g 100 mL/hr over 30 Minutes Intravenous On call 01/09/18 1027 01/10/18 0855        Subjective:   Darragh Nay was seen and examined today.  Patient has no complaints, no fevers or chills.  No abdominal pain, nausea vomiting chest pain or shortness of breath.  Objective:   Vitals:   01/12/18 1848 01/12/18 2120 01/13/18 0545 01/13/18 0900  BP: 119/74 115/84 138/87 132/80  Pulse: 80 88 85 87  Resp: 14 18 16 18   Temp: 98.2 F (36.8 C) 98.5 F (36.9 C) 98.8 F (37.1 C) 97.9 F (36.6 C)  TempSrc: Oral Oral Oral Oral  SpO2: 100% 98% 97% 98%  Weight:  45.8 kg (100 lb 15.5 oz)    Height:        Intake/Output Summary (Last 24 hours) at 01/13/2018 1212 Last data filed at 01/13/2018 0631 Gross per 24 hour  Intake 718.09 ml  Output 596 ml  Net 122.09 ml     Wt Readings from Last 3 Encounters:  01/12/18 45.8 kg (100 lb 15.5 oz)  12/25/17 44.9 kg (99 lb)  11/11/17 45 kg (99 lb 3.3 oz)     Exam    General: Alert and oriented, NAD  Eyes:   HEENT:    Cardiovascular: S1 S2 auscultated, Regular rate and  rhythm.  Respiratory: Clear to auscultation bilaterally, no wheezing, rales or rhonchi  Gastrointestinal: Soft, nontender, nondistended, + bowel sounds  Ext: bilateral AKA with wound VAC  Neuro:   Musculoskeletal: No digital cyanosis, clubbing  Skin: Dressing intact on the left stump and wound VAC  Psych: Normal affect and demeanor, alert and oriented     Data Reviewed:  I have personally reviewed following labs and imaging studies  Micro Results Recent Results (from the past 240 hour(s))  Blood Cultures x 2 sites     Status: None (Preliminary result)   Collection Time: 01/09/18  9:32 AM  Result Value Ref Range Status   Specimen Description BLOOD LEFT HAND  Final   Special Requests   Final    BOTTLES DRAWN AEROBIC ONLY Blood Culture adequate volume   Culture   Final    NO GROWTH 3 DAYS Performed at [facility], [address] [place], [place] [address]    Report Status PENDING  Incomplete  Blood Cultures x 2 sites     Status: None (Preliminary result)   Collection Time: 01/09/18 10:10 AM  Result Value Ref Range Status   Specimen Description BLOOD LEFT ARM  Final   Special Requests   Final    BOTTLES DRAWN AEROBIC ONLY Blood Culture adequate volume   Culture   Final    NO GROWTH 3 DAYS Performed at [facility], [address] [place], [place] [address]    Report Status PENDING  Incomplete  MRSA PCR Screening     Status: Abnormal   Collection Time: 01/09/18  1:54 PM  Result Value Ref Range Status   MRSA by PCR POSITIVE (A) NEGATIVE Final    Comment:        The GeneXpert MRSA Assay (FDA approved for NASAL specimens only), is one component of a comprehensive MRSA colonization surveillance program. It is not intended to diagnose MRSA infection nor to guide or monitor treatment for MRSA infections. RESULT CALLED TO, READ BACK BY AND VERIFIED WITH: Donnita FallsH. Bowman RN 16:30 01/09/18 ([email or web address]) Performed at [facility], [address]  [place], [place] [address]   Aerobic/Anaerobic Culture (surgical/deep wound)     Status: None (Preliminary result)   Collection Time: 01/09/18  3:59 PM  Result Value Ref Range Status   Specimen Description WOUND LEFT GROIN  Final   Special Requests PATIENT ON FOLLOWING ANCEF  Final   Gram Stain   Final    FEW WBC PRESENT,BOTH PMN AND MONONUCLEAR RARE GRAM POSITIVE COCCI Performed at [facility], [address] [place], [place] [address]    Culture   Final    MODERATE METHICILLIN RESISTANT STAPHYLOCOCCUS AUREUS NO ANAEROBES ISOLATED; CULTURE IN PROGRESS FOR 5 DAYS    Report Status PENDING  Incomplete   Organism ID, Bacteria METHICILLIN RESISTANT STAPHYLOCOCCUS AUREUS  Final      Susceptibility   Methicillin resistant staphylococcus aureus - MIC*    CIPROFLOXACIN >=8 RESISTANT Resistant     ERYTHROMYCIN RESISTANT Resistant     GENTAMICIN <=0.5 SENSITIVE Sensitive     OXACILLIN >=4 RESISTANT Resistant     TETRACYCLINE >=16 RESISTANT Resistant     VANCOMYCIN 1 SENSITIVE Sensitive     TRIMETH/SULFA <=10 SENSITIVE Sensitive     CLINDAMYCIN RESISTANT Resistant     RIFAMPIN <=0.5 SENSITIVE Sensitive     Inducible Clindamycin POSITIVE Resistant     * MODERATE METHICILLIN RESISTANT STAPHYLOCOCCUS AUREUS    Radiology Reports No results found.  Lab Data:  CBC: Recent Labs  Lab 01/09/18 1019  01/10/18 0613 01/11/18 0719 01/12/18 0051 01/12/18 0406 01/13/18 0354  WBC 24.3*  --  19.4* 10.5  --  11.3* 13.5*  NEUTROABS 21.2*  --   --   --   --   --   --  HGB 12.8*   < > 8.4* 5.5* 12.9* 12.3* 13.0  HCT 38.4*   < > 25.7* 17.1* 38.5* 36.5* 39.7  MCV 85.1  --  88.9 91.4  --  88.4 91.1  PLT 631*  --  454* 308  --  257 300   < > = values in this interval not displayed.   Basic Metabolic Panel: Recent Labs  Lab 01/09/18 1019 01/09/18 1650 01/10/18 0613 01/11/18 0719 01/12/18 0406 01/13/18 0354  NA 132* 133* 133* 136 137 137  K 5.6* 4.8 5.1 3.9 3.8 3.9  CL 97*  --  100 107  105 103  CO2 18*  --  20* 25 24 26   GLUCOSE 112*  --  142* 103* 85 97  BUN 32*  --  34* 11 8 7*  CREATININE 1.12  --  1.58* 0.68 0.59* 0.65  CALCIUM 8.6*  --  7.7* 7.3* 7.9* 8.3*   GFR: Estimated Creatinine Clearance: 62.8 mL/min (by C-G formula based on SCr of 0.65 mg/dL). Liver Function Tests: No results for input(s): AST, ALT, ALKPHOS, BILITOT, PROT, ALBUMIN in the last 168 hours. No results for input(s): LIPASE, AMYLASE in the last 168 hours. No results for input(s): AMMONIA in the last 168 hours. Coagulation Profile: Recent Labs  Lab 01/09/18 1149  INR 1.07   Cardiac Enzymes: No results for input(s): CKTOTAL, CKMB, CKMBINDEX, TROPONINI in the last 168 hours. BNP (last 3 results) No results for input(s): PROBNP in the last 8760 hours. HbA1C: No results for input(s): HGBA1C in the last 72 hours. CBG: Recent Labs  Lab 01/09/18 0735  GLUCAP 119*   Lipid Profile: No results for input(s): CHOL, HDL, LDLCALC, TRIG, CHOLHDL, LDLDIRECT in the last 72 hours. Thyroid Function Tests: No results for input(s): TSH, T4TOTAL, FREET4, T3FREE, THYROIDAB in the last 72 hours. Anemia Panel: No results for input(s): VITAMINB12, FOLATE, FERRITIN, TIBC, IRON, RETICCTPCT in the last 72 hours. Urine analysis:    Component Value Date/Time   COLORURINE YELLOW 11/08/2017 0031   APPEARANCEUR CLEAR 11/08/2017 0031   LABSPEC 1.016 11/08/2017 0031   PHURINE 5.0 11/08/2017 0031   GLUCOSEU NEGATIVE 11/08/2017 0031   HGBUR SMALL (A) 11/08/2017 0031   BILIRUBINUR NEGATIVE 11/08/2017 0031   KETONESUR 20 (A) 11/08/2017 0031   PROTEINUR NEGATIVE 11/08/2017 0031   UROBILINOGEN 0.2 11/13/2013 1634   NITRITE NEGATIVE 11/08/2017 0031   LEUKOCYTESUR NEGATIVE 11/08/2017 0031     Crist Kruszka M.D. [facility] 01/13/2018, 12:12 PM  Pager: [phone] Between 7am to 7pm - call Pager - [phone]  After 7pm go to [email or web address] - password [email or web address]  Call night coverage person covering after  7pm

## 2018-01-13 NOTE — Progress Notes (Signed)
  Progress Note    01/13/2018 1:15 PM 4 Days Post-Op  Subjective: No acute issues  Vitals:   01/13/18 0545 01/13/18 0900  BP: 138/87 132/80  Pulse: 85 87  Resp: 16 18  Temp: 98.8 F (37.1 C) 97.9 F (36.6 C)  SpO2: 97% 98%    Physical Exam: Awake and alert Left groin wound with some old blood draining, wound is intact deep skin superficially is open.  There is no evidence of infection in the left groin Left AKA wound repacked base is clean  CBC    Component Value Date/Time   WBC 13.5 (H) 01/13/2018 0354   RBC 4.36 01/13/2018 0354   HGB 13.0 01/13/2018 0354   HCT 39.7 01/13/2018 0354   HCT 22.1 (L) 11/10/2017 0443   PLT 300 01/13/2018 0354   MCV 91.1 01/13/2018 0354   MCH 29.8 01/13/2018 0354   MCHC 32.7 01/13/2018 0354   RDW 15.6 (H) 01/13/2018 0354   LYMPHSABS 1.0 01/09/2018 1019   MONOABS 1.3 (H) 01/09/2018 1019   EOSABS 0.0 01/09/2018 1019   BASOSABS 0.1 01/09/2018 1019    BMET    Component Value Date/Time   NA 137 01/13/2018 0354   K 3.9 01/13/2018 0354   CL 103 01/13/2018 0354   CO2 26 01/13/2018 0354   GLUCOSE 97 01/13/2018 0354   BUN 7 (L) 01/13/2018 0354   CREATININE 0.65 01/13/2018 0354   CREATININE 0.92 11/13/2013 1031   CALCIUM 8.3 (L) 01/13/2018 0354   GFRNONAA >60 01/13/2018 0354   GFRNONAA >89 11/13/2013 1031   GFRAA >60 01/13/2018 0354   GFRAA >89 11/13/2013 1031    INR    Component Value Date/Time   INR 1.07 01/09/2018 1149     Intake/Output Summary (Last 24 hours) at 01/13/2018 1315 Last data filed at 01/13/2018 0631 Gross per 24 hour  Intake 478.09 ml  Output 596 ml  Net -117.91 ml     Assessment/plan:  62 y.o. male is s/p patch angioplasty of his left common femoral and profunda femoris arteries.  Under the wound VAC there was some old appearing blood but I do not think he is actively bleeding and his hematocrit is stable.  We will plan to replace the wound VAC in the left groin and he will continue continued packing of  his left AKA site.  He is on antibiotics at the direction of ID.    Wayburn Shaler C. Randie Heinzain, MD [facility] [place] Office: [phone] Pager: [phone]  01/13/2018 1:15 PM

## 2018-01-14 LAB — CULTURE, BLOOD (ROUTINE X 2)
CULTURE: NO GROWTH
Culture: NO GROWTH
Special Requests: ADEQUATE
Special Requests: ADEQUATE

## 2018-01-14 LAB — BASIC METABOLIC PANEL
Anion gap: 8 (ref 5–15)
BUN: 7 mg/dL — AB (ref 8–23)
CHLORIDE: 102 mmol/L (ref 98–111)
CO2: 26 mmol/L (ref 22–32)
Calcium: 8.5 mg/dL — ABNORMAL LOW (ref 8.9–10.3)
Creatinine, Ser: 0.62 mg/dL (ref 0.61–1.24)
Glucose, Bld: 87 mg/dL (ref 70–99)
POTASSIUM: 4.1 mmol/L (ref 3.5–5.1)
SODIUM: 136 mmol/L (ref 135–145)

## 2018-01-14 LAB — AEROBIC/ANAEROBIC CULTURE W GRAM STAIN (SURGICAL/DEEP WOUND)

## 2018-01-14 LAB — CBC
HEMATOCRIT: 39.9 % (ref 39.0–52.0)
Hemoglobin: 12.9 g/dL — ABNORMAL LOW (ref 13.0–17.0)
MCH: 29.7 pg (ref 26.0–34.0)
MCHC: 32.3 g/dL (ref 30.0–36.0)
MCV: 91.9 fL (ref 78.0–100.0)
PLATELETS: 334 10*3/uL (ref 150–400)
RBC: 4.34 MIL/uL (ref 4.22–5.81)
RDW: 15 % (ref 11.5–15.5)
WBC: 11.6 10*3/uL — AB (ref 4.0–10.5)

## 2018-01-14 LAB — AEROBIC/ANAEROBIC CULTURE (SURGICAL/DEEP WOUND)

## 2018-01-14 MED ORDER — CHLORHEXIDINE GLUCONATE CLOTH 2 % EX PADS
6.0000 | MEDICATED_PAD | Freq: Every day | CUTANEOUS | Status: DC
  Administered 2018-01-14: 6 via TOPICAL

## 2018-01-14 MED ORDER — LIDOCAINE 4 % EX PTCH: 1.0000 | MEDICATED_PATCH | Freq: Every day | CUTANEOUS | 0 refills | Status: DC

## 2018-01-14 MED ORDER — RIFAMPIN 300 MG PO CAPS: 300.0000 mg | ORAL_CAPSULE | Freq: Two times a day (BID) | ORAL | Status: AC

## 2018-01-14 MED ORDER — OXYCODONE-ACETAMINOPHEN 5-325 MG PO TABS: 1.0000 | ORAL_TABLET | Freq: Four times a day (QID) | ORAL | 0 refills | Status: DC | PRN

## 2018-01-14 MED ORDER — METOPROLOL TARTRATE 25 MG PO TABS: 12.5000 mg | ORAL_TABLET | Freq: Two times a day (BID) | ORAL | Status: DC

## 2018-01-14 MED ORDER — CLOPIDOGREL BISULFATE 75 MG PO TABS: 75.0000 mg | ORAL_TABLET | Freq: Every day | ORAL | Status: DC

## 2018-01-14 MED ORDER — SULFAMETHOXAZOLE-TRIMETHOPRIM 800-160 MG PO TABS: 1.0000 | ORAL_TABLET | Freq: Two times a day (BID) | ORAL | Status: AC

## 2018-01-14 MED ORDER — OXYCODONE-ACETAMINOPHEN 5-325 MG PO TABS: 1.0000 | ORAL_TABLET | Freq: Four times a day (QID) | ORAL | Status: DC | PRN

## 2018-01-14 NOTE — Clinical Social Work Note (Signed)
Patient medically stable for discharge back [facility] today. Admissions staff person Jomarie LongsJoseph notified and discharge clinicals transmitted to facility. Patient informed of discharge and contact made with brother Precious ReelJerry Raborn, by phone to determine if patient returning to [facility] or going to another facility, and he and his sister have decided for patient to return [facility]. Brother aware that patient is discharging today.  Mr. Roseanne RenoStewart will be transported back to [facility] by non-emergency transport.  CSW signing off as no other SW intervention services needed.  Genelle BalVanessa Jerae Izard, MSW, LCSW Licensed Clinical Social Worker Clinical Social Work Department [facility] [phone]

## 2018-01-14 NOTE — Consult Note (Signed)
WOC follow-up:  Called by bedside nurse since Vac was alarming "blockage." Track pad had a blood clot underneath when it was removed and instected.  Cannister also clogged with small amt clotted blood.  Replaced track pad and cannister and suction was resumed. 30 min later, called again for the same problem.  Small amt clotted blood in the tubing, trackpad, and cannister.  Vac will not be able to maintain adequate suction when this continues to occur.  Called Dr Randie Heinzain of the vascular team to discuss. Vac was ordered to be discontinued and applied moist gauze dressing.  Wound is beefy red with no active bleeding noted; refer to consult yesterday for measurements.  Dressing change orders provided for staff nurses to perform daily and pt was medicated for pain. Please refer to vascular team for further questions regarding plan of care. Please re-consult if further assistance is needed.  Thank-you,  Cammie Mcgeeawn Montanna Mcbain MSN, RN, CWOCN, [place]AP, CNS [phone]

## 2018-01-14 NOTE — Discharge Summary (Signed)
Physician Discharge Summary   Patient ID: Stephen Bowen MRN: [id number] DOB/AGE: 10-15-1955 62 y.o.  Admit date: 01/09/2018 Discharge date: 01/14/2018  Primary Care Physician:  Shelbie Ammons, MD   Recommendations for Outpatient Follow-up:  1. Follow up with PCP in 1-2 weeks 2. Apply moist gauze packing to left groin daily and cover with dry gauze and tape 3. Please hold Plavix until follow-up with vascular surgery    Home Health: None  Equipment/Devices: None  Discharge Condition: stable CODE STATUS: FULL  Diet recommendation: Heart healthy diet   Discharge Diagnoses:    . 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 Acute renal failure (ARF) (HCC) . Essential hypertension . HLD (hyperlipidemia) . Wound cultures with MRSA  Consults:  Vascular surgery  wound care Infectious disease    Allergies:  No Known Allergies   DISCHARGE MEDICATIONS: Allergies as of 01/14/2018   No Known Allergies     Medication List    STOP taking these medications   amLODipine 10 MG tablet Commonly known as:  NORVASC   spironolactone 25 MG tablet Commonly known as:  ALDACTONE   UNABLE TO FIND     TAKE these medications   acetaminophen 325 MG tablet Commonly known as:  TYLENOL Take 2 tablets (650 mg total) by mouth every 6 (six) hours as needed for mild pain, fever or headache.   ACIDOPHILUS PO Take 1 capsule by mouth every 12 (twelve) hours. x30 days   aspirin 81 MG EC tablet Take 1 tablet (81 mg total) by mouth daily.   atorvastatin 40 MG tablet Commonly known as:  LIPITOR Take 1 tablet (40 mg total) by mouth daily at 6 PM.   clopidogrel 75 MG tablet Commonly known as:  PLAVIX Take 1 tablet (75 mg total) by mouth daily. HOLD until follow-up appt with vascular surgery What changed:  additional instructions   docusate sodium 100 MG capsule Commonly known as:  COLACE Take 1 capsule  (100 mg total) by mouth daily.   folic acid 1 MG tablet Commonly known as:  FOLVITE Take 1 tablet (1 mg total) by mouth daily.   Lidocaine 4 % Ptch Commonly known as:  ASPERCREME LIDOCAINE Apply 1 patch topically daily. Apply to left shoulder   metoprolol tartrate 25 MG tablet Commonly known as:  LOPRESSOR Take 0.5 tablets (12.5 mg total) by mouth 2 (two) times daily. What changed:  how much to take   mirtazapine 15 MG tablet Commonly known as:  REMERON Take 15 mg by mouth at bedtime.   oxyCODONE-acetaminophen 5-325 MG tablet Commonly known as:  PERCOCET/ROXICET Take 1-2 tablets by mouth every 6 (six) hours as needed for severe pain. What changed:  how much to take   pantoprazole 20 MG tablet Commonly known as:  PROTONIX Take 20 mg by mouth daily as needed for heartburn or indigestion.   polyethylene glycol packet Commonly known as:  MIRALAX / GLYCOLAX Take 17 g by mouth daily.   PROTEIN PO Take 30 mLs by mouth 2 (two) times daily. PROSTAT   rifampin 300 MG capsule Commonly known as:  RIFADIN Take 1 capsule (300 mg total) by mouth every 12 (twelve) hours for 12 days. Stop date 01/26/18   SENNA-PLUS 8.6-50 MG tablet Generic drug:  senna-docusate Take 2 tablets by mouth at bedtime.   sulfamethoxazole-trimethoprim 800-160 MG tablet Commonly known as:  BACTRIM DS,SEPTRA DS Take 1 tablet by mouth every 12 (twelve)  hours for 12 days. Stop date 01/26/18   TAB-A-VITE PO Take 1 tablet by mouth daily.   Vitamin D (Ergocalciferol) 50000 units Caps capsule Commonly known as:  DRISDOL Take 50,000 Units by mouth every 7 (seven) days.            Discharge Care Instructions  (From admission, onward)        Start     Ordered   01/14/18 0000  Discharge wound care:    Comments:  Wound care: Apply moist gauze packing to left groin daily and cover with dry gauze and tape   01/14/18 1000       Brief H and P: For complete details please refer to admission H and P, but  in brief Patient is a 62 year old male with history of HTN; HLD; CAD; PVD s/p BL amputation; and chronic left inguinal wound, left femoropopliteal bypass with graft presenting from SNF with persistent bleeding from left inguinal wound.  Patient has had ongoing poor wound healing and continuing to have bleeding from his left AKA stump, poorly healing chronic left inguinal wound.  At skilled nursing facility he was found to be aggressively bleeding from his inguinal wound and [facility]  Due to the concern for hemorrhagic shock, needing vascular surgery consult, patient was transfused 2 units of packed RBCs, [facility]up.  Hospital Course:   Hemorrhage from wound in the setting of previous bilateral AKA, chronic nonhealing wounds on the left groin and left stump -There was concern for hemorrhagic shock at the time of transfer, currently improved. -Vascular surgery consult was obtained, seen by Dr. Randie Heinzain, patient underwent exploration of the left groin wound with angioplasty, wound VAC placed, postop day # 5 -Patient received 2 units packed RBCs on 7/18.  Hemoglobin was 5.5 on 7/20, received 3 units of packed RBCs. - Hemoglobin stable 12.9 at the time of discharge.  - Wound cultures growing MRSA. -ID consult obtained, seen by Dr Luciana Axeomer, recommended Bactrim and rifampin till August 4, vancomycin discontinued -Patient was seen by vascular surgery prior to discharge and was cleared.  Recommended wound VAC removal and place wet-to-dry dressings in the left groin -Patient was seen by wound care nurse, applied moist gauze dressing and as recommended by vascular surgery    Essential hypertension -BP stable, continue metoprolol 12.5 mg twice a day  -Amlodipine, Aldactone were held as BP was somewhat soft     HLD (hyperlipidemia) - Continue Lipitor  Acute kidney injury Creatinine 1.58 at the time of admission, likely due to #1, Improved with  transfusion and gentle hydration, creatinine 0.62 at the time of discharge.   Day of Discharge S: No acute complaints except pain during the dressing changes  BP 118/75 (BP Location: Right Arm)   Pulse 78   Temp 98 F (36.7 C) (Oral)   Resp 15   Ht 5\' 6"  (1.676 m)   Wt 49.7 kg (109 lb 9.1 oz)   SpO2 97%   BMI 17.68 kg/m   Physical Exam: General: Alert and awake oriented x3 not in any acute distress. HEENT: anicteric sclera, pupils reactive to light and accommodation CVS: S1-S2 clear no murmur rubs or gallops Chest: clear to auscultation bilaterally, no wheezing rales or rhonchi Abdomen: soft nontender, nondistended, normal bowel sounds Extremities: Bilateral AKA, wound VAC removed, dressing intact Neuro: no new deficits   The results of significant diagnostics from this hospitalization (including imaging, microbiology, ancillary and laboratory) are listed  below for reference.      Procedures/Studies:   No results found.    LAB RESULTS: Basic Metabolic Panel: Recent Labs  Lab 01/13/18 0354 01/14/18 0436  NA 137 136  K 3.9 4.1  CL 103 102  CO2 26 26  GLUCOSE 97 87  BUN 7* 7*  CREATININE 0.65 0.62  CALCIUM 8.3* 8.5*   Liver Function Tests: No results for input(s): AST, ALT, ALKPHOS, BILITOT, PROT, ALBUMIN in the last 168 hours. No results for input(s): LIPASE, AMYLASE in the last 168 hours. No results for input(s): AMMONIA in the last 168 hours. CBC: Recent Labs  Lab 01/09/18 1019  01/13/18 0354 01/14/18 0436  WBC 24.3*   < > 13.5* 11.6*  NEUTROABS 21.2*  --   --   --   HGB 12.8*   < > 13.0 12.9*  HCT 38.4*   < > 39.7 39.9  MCV 85.1   < > 91.1 91.9  PLT 631*   < > 300 334   < > = values in this interval not displayed.   Cardiac Enzymes: No results for input(s): CKTOTAL, CKMB, CKMBINDEX, TROPONINI in the last 168 hours. BNP: Invalid input(s): POCBNP CBG: Recent Labs  Lab 01/09/18 0735  GLUCAP 119*      Disposition and  Follow-up: Discharge Instructions    Diet - low sodium heart healthy   Complete by:  As directed    Discharge wound care:   Complete by:  As directed    Wound care: Apply moist gauze packing to left groin daily and cover with dry gauze and tape   Increase activity slowly   Complete by:  As directed        DISPOSITION: Skilled nursing facility   DISCHARGE FOLLOW-UP Follow-up Information    Maeola Harman, MD Follow up on 02/07/2018.   Specialties:  Vascular Surgery, Cardiology Why:  at 3:15PM  Contact information: [address] [place] [address] [phone]        Shelbie Ammons, MD. Schedule an appointment as soon as possible for a visit in 2 week(s).   Specialty:  Internal Medicine Contact information: [address] [place] [address] [phone]            Time coordinating discharge:  40 minutes  Signed:   Thad Ranger M.D. [facility] 01/14/2018, 11:59 AM Pager: [phone]

## 2018-01-14 NOTE — Progress Notes (Addendum)
  Progress Note    01/14/2018 7:43 AM 5 Days Post-Op  Subjective:  No new issues  Vitals:   01/13/18 2038 01/14/18 0615  BP: 117/82 126/87  Pulse: 81 81  Resp:  16  Temp: 98.2 F (36.8 C) 98.3 F (36.8 C)  SpO2: 98% 98%    Physical Exam: Awake and alert Left groin vac is to suction Left aka wound appears to have granulation, there is evidence of persistent purulence on the dressing-repacked with wet to dry gauze  CBC    Component Value Date/Time   WBC 11.6 (H) 01/14/2018 0436   RBC 4.34 01/14/2018 0436   HGB 12.9 (L) 01/14/2018 0436   HCT 39.9 01/14/2018 0436   HCT 22.1 (L) 11/10/2017 0443   PLT 334 01/14/2018 0436   MCV 91.9 01/14/2018 0436   MCH 29.7 01/14/2018 0436   MCHC 32.3 01/14/2018 0436   RDW 15.0 01/14/2018 0436   LYMPHSABS 1.0 01/09/2018 1019   MONOABS 1.3 (H) 01/09/2018 1019   EOSABS 0.0 01/09/2018 1019   BASOSABS 0.1 01/09/2018 1019    BMET    Component Value Date/Time   NA 136 01/14/2018 0436   K 4.1 01/14/2018 0436   CL 102 01/14/2018 0436   CO2 26 01/14/2018 0436   GLUCOSE 87 01/14/2018 0436   BUN 7 (L) 01/14/2018 0436   CREATININE 0.62 01/14/2018 0436   CREATININE 0.92 11/13/2013 1031   CALCIUM 8.5 (L) 01/14/2018 0436   GFRNONAA >60 01/14/2018 0436   GFRNONAA >89 11/13/2013 1031   GFRAA >60 01/14/2018 0436   GFRAA >89 11/13/2013 1031    INR    Component Value Date/Time   INR 1.07 01/09/2018 1149     Intake/Output Summary (Last 24 hours) at 01/14/2018 0743 Last data filed at 01/14/2018 [id number] Gross per 24 hour  Intake 240 ml  Output 1250 ml  Net -1010 ml     Assessment:  62 y.o. male is s/p left common femoral artery patch and excision of infected graft.  Plan: Continue wound vac to suction left groin and wtd on left aka site abx per ID Can be discharged from vascular standpoint and will evaluate in office in 2-3 weeks for wound healing  Treson Laura C. Randie Heinzain, MD [facility] Office:  [phone] Pager: [phone]  01/14/2018 7:43 AM   Addendum: Wound VAC malfunctioning will place wet-to-dry in left groin.  Lemar LivingsBrandon Tyteanna Ost, MD

## 2018-01-14 NOTE — Progress Notes (Signed)
Patient is discharging [facility], report given to Rosey Batheresa (nurse), and answered all her questions, waiting for the non-emergency transport to pick him up, oncoming nurse made aware of the situation.

## 2018-01-14 NOTE — Progress Notes (Signed)
Patient refusing to look at the wound site.

## 2018-02-07 ENCOUNTER — Ambulatory Visit: Payer: Medicaid Other | Admitting: Family

## 2018-02-07 ENCOUNTER — Encounter: Payer: Self-pay | Admitting: Family

## 2018-03-10 IMAGING — CT CT ANGIO NECK
2 of 8 series · 7 of 34 positions shown · IV contrast (isovue)
Comparison: Head CT without contrast 1328 hours today.

CLINICAL DATA: 60-year-old male with altered mental status status
post left lower extremity vascular surgery today. Possible new onset
blindness. Noncontrast head CT at 1328 hours today suggesting acute
left PCA infarct. Initial encounter.

EXAM:
CT ANGIOGRAPHY HEAD AND NECK
TECHNIQUE: Multidetector CT imaging of the head and neck was performed using
the standard protocol during bolus administration of intravenous
contrast. Multiplanar CT image reconstructions and MIPs were
obtained to evaluate the vascular anatomy. Carotid stenosis
measurements (when applicable) are obtained utilizing NASCET
criteria, using the distal internal carotid diameter as the
denominator.
CONTRAST:  50 mL Isovue 370

[Series 7: cta neck axial · axial · 0.46mm/px · z∈[+60,+313]mm · 6 of 353 slices shown]
[im 51/353  soft-tissue]
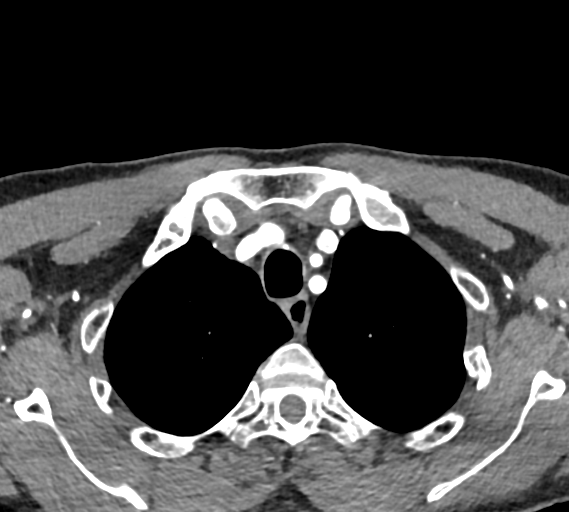
[im 101/353  bone]
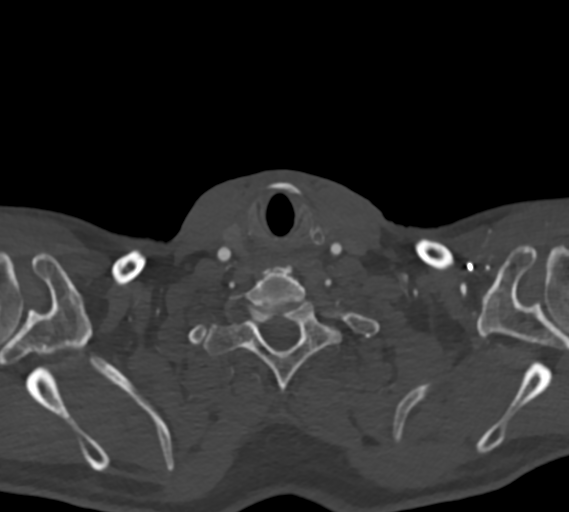
[im 151/353  soft-tissue]
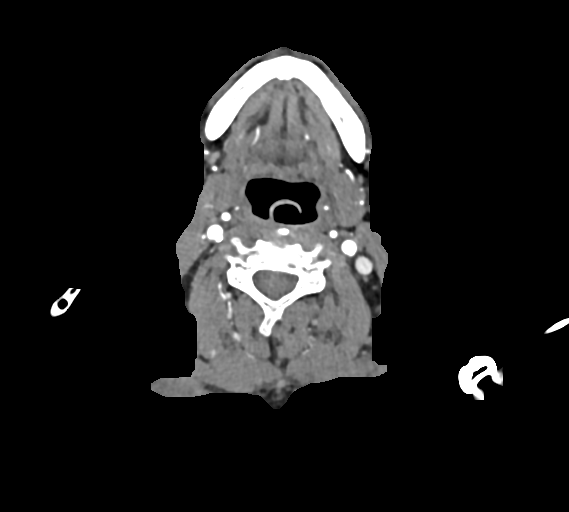
[im 202/353  bone]
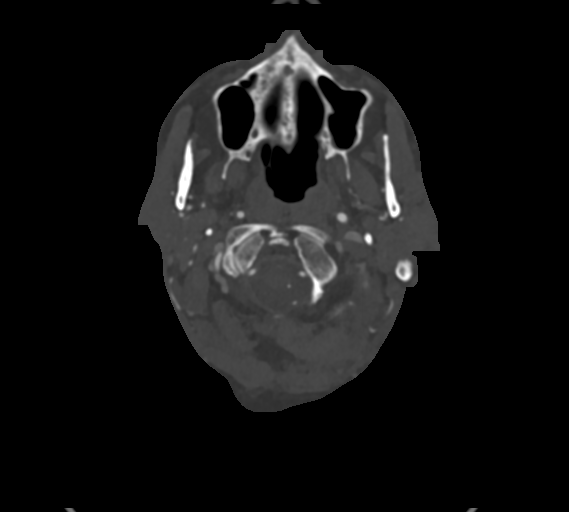
[im 252/353  soft-tissue]
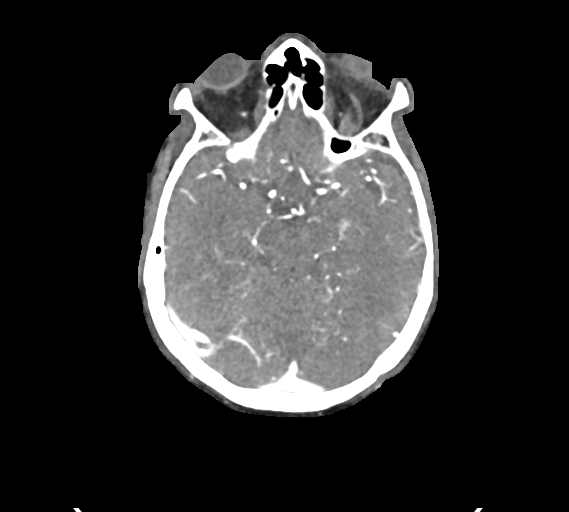
[im 302/353  bone]
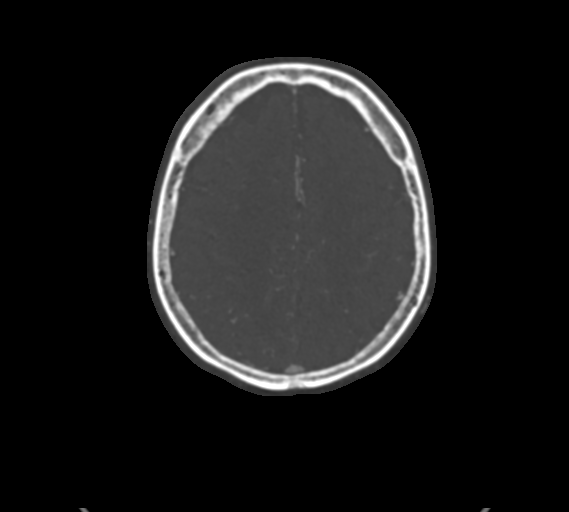

[Series 9: cta neck sagittal · sagittal · 0.48mm/px · 1 of 167 slices shown]
[im 51/167  soft-tissue]
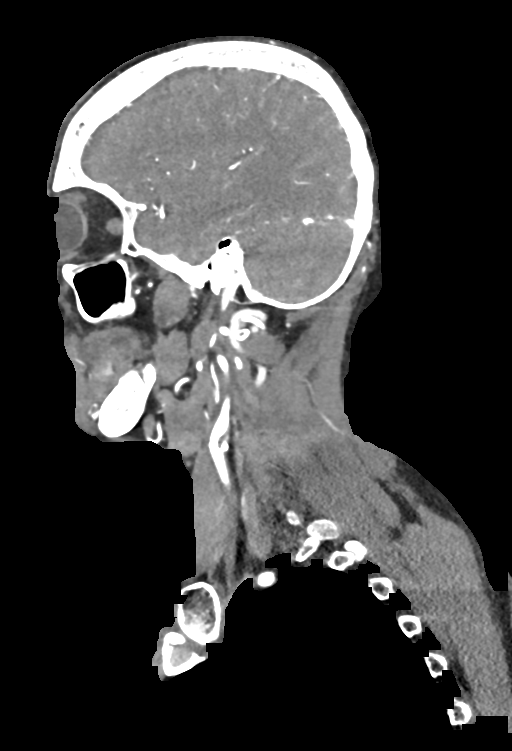

[7 of 34 positions shown; findings below may reference images not displayed]

FINDINGS: CTA NECK

Skeleton: Poor dentition. Chronic left lateral rib fractures. No
acute osseous abnormality identified.

Visualized paranasal sinuses and mastoids are stable and well
pneumatized.

Upper chest: Negative lung apices except for the possibility of
emphysema. No superior mediastinal lymphadenopathy.

Other neck: Negative thyroid, larynx, pharynx, parapharyngeal
spaces, retropharyngeal space, sublingual space, submandibular
glands and parotid glands. Visualized orbit soft tissues are within
normal limits. Visualized scalp soft tissues are within normal
limits. No cervical lymphadenopathy.

Aortic arch: 3 vessel arch configuration. Little arch
atherosclerosis, however there is great vessel origin
atherosclerosis as detailed below.

Right carotid system: No brachiocephalic artery or right CCA origin
stenosis despite some calcified plaque. Negative right CCA proximal
to the bifurcation. At the right carotid bifurcation there is bulky
calcified and low-density plaque involving the right ICA origin and
bulb. However, stenosis is less than 50 % with respect to the distal
vessel. Negative cervical right ICA otherwise.

Left carotid system: Soft plaque with High-grade stenosis at the
left CCA origin approaching a radiographic string sign, numerically
estimated at 75 % with respect to the distal vessel. See series 5,
image 174. The left CCA remains patent. At the left carotid
bifurcation there is bulky soft and calcified plaque however,
stenosis is less than 50 % with respect to the distal vessel.
Negative cervical left ICA otherwise.

Vertebral arteries:New line no proximal right subclavian artery
stenosis. Right vertebral artery origin is normal. There is
calcified right V4 segment plaque which is not hemodynamically
significant. The right vertebral artery is patent to the skullbase
without stenosis.

No proximal left subclavian artery stenosis despite soft and
calcified plaque. The left vertebral artery origin is normal. There
is compression of the proximal left V2 segment in the transverse
foramen related to lower cervical spine degeneration (series 5,
image 127), but otherwise no left vertebral artery stenosis in the
neck. Intermittent left V2 segment tortuosity.

CTA HEAD

Posterior circulation: Both distal vertebral arteries appear
somewhat diminutive but are patent without stenosis. Patent
vertebrobasilar junction. Both PICA origins and AICA origins appear
to be patent. Patent but diminutive basilar artery. Mild distal
basilar stenosis. SCA and right PCA origins are patent. There is a
fetal type left PCA origin, but the left P1 segment appears likely
occluded. There is superimposed distal left P2 short-segment
occlusion. See series 10, image 21. Despite these findings there is
distal left PCA enhancement. There is associated paucity of cerebral
enhancement in the left PCA territory (series 5, image 48).

There is mild right PCA branch irregularity.

Anterior circulation: Both ICA siphons are patent. Both supraclinoid
ICAs are diminutive but patent. There is superimposed mild stenosis
of the left supraclinoid ICA. There is left greater than right
anterior genu calcified plaque. The posterior communicating artery
origins are normal. The carotid termini, MCA and ACA origins are
patent. The anterior communicating artery is diminutive or absent.
The bilateral PCA branches are patent. Left MCA M1 segment,
bifurcation, and left MCA branches are patent without irregularity.
Right MCA M1 segment, bifurcation, and right MCA branches are patent
without irregularity.

Venous sinuses: Patent.

Anatomic variants: Possible fetal type left PCA origin.

Review of the MIP images confirms the above findings
IMPRESSION: 1. Positive for segmental occlusion of the Left PCA (P1 and distal
P2 segments), but there is preserved distal PCA branch enhancement.
Suspect associated acute left PCA infarct.
Preliminary report of these findings discussed by telephone with Dr.
NIN WICKLUND on 06/21/2016 at [DATE] . At 7633 hours.
2. No other circle of Willis branch or large vessel occlusion, but
there is High-grade stenosis at the Left CCA origin (numerically
estimated at 75% but approaching a Radiographic-String-Sign) related
to combined calcified and low-density atherosclerotic plaque.
3. Generalized diminutive appearance of the intracranial circulation
felt related to chronic vascular disease. Superimposed mild stenoses
at the left ICA siphon, a distal basilar artery, and right PCA.
4. Soft and calcified atherosclerosis at both carotid bifurcations
but not hemodynamically significant.

## 2018-05-28 ENCOUNTER — Encounter: Payer: Self-pay | Admitting: Gastroenterology

## 2018-05-28 ENCOUNTER — Ambulatory Visit (INDEPENDENT_AMBULATORY_CARE_PROVIDER_SITE_OTHER): Payer: Medicaid Other | Admitting: Gastroenterology

## 2018-05-28 VITALS — BP 136/90 | HR 76

## 2018-05-28 DIAGNOSIS — A0472 Enterocolitis due to Clostridium difficile, not specified as recurrent: Secondary | ICD-10-CM

## 2018-05-28 NOTE — Patient Instructions (Signed)
Discontinue Xifaxian  Start taking Florastor Probiotic 1 capsule daily for 4 weeks  Continue oral Vancomycin to complete course   Follow up as needed   If you are age 62 or older, your body mass index should be between 23-30. Your There is no height or weight on file to calculate BMI. If this is out of the aforementioned range listed, please consider follow up with your Primary Care Provider.  If you are age 62 or younger, your body mass index should be between 19-25. Your There is no height or weight on file to calculate BMI. If this is out of the aformentioned range listed, please consider follow up with your Primary Care Provider.    Thank you for choosing [facility]  Philbert RiserKavitha Nandigam,MD

## 2018-05-28 NOTE — Progress Notes (Signed)
Stephen Bowen    [id number]    08-19-1955  Primary Care Physician:Haque, Myrene Galas, MD  Referring Physician: Shelbie Ammons, MD [address] [place], [place] [address]  Chief complaint: C. difficile infection HPI: 62 year old male with multiple comorbidities including peripheral vascular disease, CVA, bilateral lower extremity amputation is here from [facility] for evaluation of recurrent C. difficile infection. Patient denies any diarrhea.  He had diarrhea about 4 weeks ago.  He is currently on vancomycin 125 mg 4 times daily for 2-week course, to be completed on May 30, 2018. Denies any nausea, vomiting, decreased appetite, abdominal pain, bloating, dark stool or blood in stool. Patient did not have any notes or information to explain the reason for referral, called rehab facility and were informed that this is a recurrent episode of C. difficile infection.  No information available what he was treated with for the first episode of C. Difficile.    Outpatient Encounter Medications as of 05/28/2018  Medication Sig  . aspirin EC 81 MG EC tablet Take 1 tablet (81 mg total) by mouth daily.  Marland Kitchen atorvastatin (LIPITOR) 40 MG tablet Take 1 tablet (40 mg total) by mouth daily at 6 PM.  . docusate sodium (COLACE) 100 MG capsule Take 1 capsule (100 mg total) by mouth daily.  . folic acid (FOLVITE) 1 MG tablet Take 1 tablet (1 mg total) by mouth daily.  . Lidocaine (ASPERCREME LIDOCAINE) 4 % PTCH Apply 1 patch topically daily. Apply to left shoulder  . metoprolol tartrate (LOPRESSOR) 25 MG tablet Take 0.5 tablets (12.5 mg total) by mouth 2 (two) times daily.  . mirtazapine (REMERON) 15 MG tablet Take 15 mg by mouth at bedtime.  . Multiple Vitamin (TAB-A-VITE PO) Take 1 tablet by mouth daily.  Marland Kitchen oxyCODONE-acetaminophen (PERCOCET/ROXICET) 5-325 MG tablet Take 1-2 tablets by mouth every 6 (six) hours as needed for severe pain.  . rifaximin (XIFAXAN) 200 MG tablet Take 200 mg  by mouth 3 (three) times daily.  . vancomycin (VANCOCIN) 125 MG capsule Take 125 mg by mouth 4 (four) times daily.  . Vitamin D, Ergocalciferol, (DRISDOL) 50000 units CAPS capsule Take 50,000 Units by mouth every 7 (seven) days.  Marland Kitchen acetaminophen (TYLENOL) 325 MG tablet Take 2 tablets (650 mg total) by mouth every 6 (six) hours as needed for mild pain, fever or headache. (Patient not taking: Reported on 05/28/2018)  . clopidogrel (PLAVIX) 75 MG tablet Take 1 tablet (75 mg total) by mouth daily. HOLD until follow-up appt with vascular surgery (Patient not taking: Reported on 05/28/2018)  . Lactobacillus (ACIDOPHILUS PO) Take 1 capsule by mouth every 12 (twelve) hours. x30 days  . pantoprazole (PROTONIX) 20 MG tablet Take 20 mg by mouth daily as needed for heartburn or indigestion.   . polyethylene glycol (MIRALAX / GLYCOLAX) packet Take 17 g by mouth daily.  Marland Kitchen PROTEIN PO Take 30 mLs by mouth 2 (two) times daily. PROSTAT  . senna-docusate (SENNA-PLUS) 8.6-50 MG tablet Take 2 tablets by mouth at bedtime.   No facility-administered encounter medications on file as of 05/28/2018.     Allergies as of 05/28/2018  . (No Known Allergies)    Past Medical History:  Diagnosis Date  . Coronary artery disease   . Encephalopathy acute 07/2016  . HTN (hypertension)   . Hyperlipemia   . Myocardial infarction (HCC)   . Non-healing surgical wound    left AKA  . Noncompliance   .  PAD (peripheral artery disease) (HCC)    a.  s/p left femoral to PT artery bypass with propaten on 02/16/16.    Past Surgical History:  Procedure Laterality Date  . ABDOMINAL AORTOGRAM W/LOWER EXTREMITY N/A 08/02/2016   Procedure: Abdominal Aortogram w/Lower Extremity;  Surgeon: Maeola HarmanBrandon Christopher Cain, MD;  [facility];  Service: Cardiovascular;  Laterality: N/A;  . ABOVE KNEE LEG AMPUTATION Right 12/04/2016  . AMPUTATION Left 06/25/2016   Procedure: AMPUTATION ABOVE KNEE;  Surgeon: Nada LibmanVance W Brabham, MD;   [facility];  Service: Vascular;  Laterality: Left;  . AMPUTATION Right 12/04/2016   Procedure: RIGHT AMPUTATION ABOVE KNEE;  Surgeon: Maeola Harmanain, Brandon Christopher, MD;  [facility];  Service: Vascular;  Laterality: Right;  . AMPUTATION Left 10/08/2017   Procedure: LEFT ABOVE KNEE AMPUTATION REVISION;  Surgeon: Maeola Harmanain, Brandon Christopher, MD;  [facility];  Service: Vascular;  Laterality: Left;  . BYPASS GRAFT POPLITEAL TO TIBIAL Left 06/21/2016   Procedure: BYPASS GRAFT POPLITEAL TO TIBIAL USING GORE PROPATEN GRAFT;  Surgeon: Maeola HarmanBrandon Christopher Cain, MD;  [facility];  Service: Vascular;  Laterality: Left;  . EMBOLECTOMY Left 06/21/2016   Procedure: EMBOLECTOMY left lower extremity.;  Surgeon: Maeola HarmanBrandon Christopher Cain, MD;  [facility];  Service: Vascular;  Laterality: Left;  . ENDARTERECTOMY FEMORAL Left 02/16/2016   Procedure: LEFT FEMORAL ENDARTERECTOMY;  Surgeon: Maeola HarmanBrandon Christopher Cain, MD;  [facility];  Service: Vascular;  Laterality: Left;  . FEMORAL BYPASS  02/16/2016   LEFT FEMORAL-POSTERIOR TIBIAL ARTERY BYPASS  WITH PROPATEN 6 MM X 80 CM VASCULAR RING GRAFT (Left)  . FEMORAL-TIBIAL BYPASS GRAFT Left 02/16/2016   Procedure: LEFT FEMORAL-POSTERIOR TIBIAL ARTERY BYPASS  WITH PROPATEN 6 MM X 80 CM VASCULAR RING GRAFT;  Surgeon: Maeola HarmanBrandon Christopher Cain, MD;  [facility];  Service: Vascular;  Laterality: Left;  . FRACTURE SURGERY    . I&D EXTREMITY Left 09/03/2016   Procedure: IRRIGATION AND DEBRIDEMENT EXTREMITY - LEFT AKA;  Surgeon: Maeola HarmanBrandon Christopher Cain, MD;  [facility];  Service: Vascular;  Laterality: Left;  . I&D EXTREMITY Left 01/09/2018   Procedure: IRRIGATION AND DEBRIDEMENT GROIN;  Surgeon: Maeola Harmanain, Brandon Christopher, MD;  [facility];  Service: Vascular;  Laterality: Left;  . PATCH ANGIOPLASTY Left 02/16/2016   Procedure: PATCH ANGIOPLASTY WITH Livia SnellenXENOSURE BIOLOGIC PATCH 1 CM X 6 CM;  Surgeon: Maeola HarmanBrandon Christopher Cain, MD;  [facility];  Service:  Vascular;  Laterality: Left;  . PATCH ANGIOPLASTY Left 01/09/2018   Procedure: PATCH ANGIOPLASTY OF LEFT COMMON FEMORAL AND PROFUNDA  USING SAPHENOUS VEIN;  Surgeon: Maeola Harmanain, Brandon Christopher, MD;  [facility];  Service: Vascular;  Laterality: Left;  . PERIPHERAL VASCULAR BALLOON ANGIOPLASTY Right 08/02/2016   Procedure: Peripheral Vascular Balloon Angioplasty;  Surgeon: Maeola HarmanBrandon Christopher Cain, MD;  [facility];  Service: Cardiovascular;  Laterality: Right;  peroneal artery  . PERIPHERAL VASCULAR CATHETERIZATION N/A 07/07/2015   Procedure: Abdominal Aortogram w/Lower Extremity;  Surgeon: Fransisco HertzBrian L Chen, MD;  [facility];  Service: Cardiovascular;  Laterality: N/A;  . PERIPHERAL VASCULAR CATHETERIZATION N/A 02/15/2016   Procedure: Abdominal Aortogram w/Lower Extremity;  Surgeon: Maeola HarmanBrandon Christopher Cain, MD;  [facility];  Service: Cardiovascular;  Laterality: N/A;  . PERIPHERAL VASCULAR CATHETERIZATION Bilateral 06/20/2016   Procedure: Lower Extremity Angiography;  Surgeon: Maeola HarmanBrandon Christopher Cain, MD;  [facility];  Service: Cardiovascular;  Laterality: Bilateral;  limited runoff on rt leg thrombolysis lt leg bypass graft  .  PERIPHERAL VASCULAR INTERVENTION Right 08/02/2016   Procedure: Peripheral Vascular Intervention;  Surgeon: Maeola Harman, MD;  [facility] CV LAB;  Service: Cardiovascular;  Laterality: Right;  Femoral , popliteal  . 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  Marland Kitchen VEIN HARVEST Left 01/09/2018   Procedure: LEFT SAPHENOUS VEIN HARVEST;  Surgeon: Maeola Harman, MD;  [facility];  Service: Vascular;  Laterality: Left;    Family History  Problem Relation Age of Onset  . Cancer Mother        deceased in 27s   . Heart attack Mother        diseased  . Heart disease Father   . Stroke Sister        age 38s  . Heart disease Brother         pacemaker in his 35s    Social History   Socioeconomic History  . Marital status: Single    Spouse name: Not on file  . Number of children: Not on file  . Years of education: Not on file  . Highest education level: Not on file  Occupational History  . Occupation: Unemployed  Social Needs  . Financial resource strain: Not on file  . Food insecurity:    Worry: Not on file    Inability: Not on file  . Transportation needs:    Medical: Not on file    Non-medical: Not on file  Tobacco Use  . Smoking status: Former Smoker    Packs/day: 3.00    Years: 40.00    Pack years: 120.00  . Smokeless tobacco: Never Used  . Tobacco comment: cutting back amount he is buying  Substance and Sexual Activity  . Alcohol use: Not Currently    Alcohol/week: 14.0 standard drinks    Types: 14 Cans of beer per week    Comment: Drinks a 40 oz "tallboy" daily   [id number]  I quitdrinking  . Drug use: No  . Sexual activity: Yes    Birth control/protection: Condom  Lifestyle  . Physical activity:    Days per week: Not on file    Minutes per session: Not on file  . Stress: Not on file  Relationships  . Social connections:    Talks on phone: Not on file    Gets together: Not on file    Attends religious service: Not on file    Active member of club or organization: Not on file    Attends meetings of clubs or organizations: Not on file    Relationship status: Not on file  . Intimate partner violence:    Fear of current or ex partner: Not on file    Emotionally abused: Not on file    Physically abused: Not on file    Forced sexual activity: Not on file  Other Topics Concern  . Not on file  Social History Narrative   Smoking cigarettes since 62 y.o   Drinks beer 24 oz every other day       Review of systems: Review of Systems  Constitutional: Negative for fever and chills.  HENT: Negative.   Eyes: Negative for blurred vision.  Respiratory: Negative for cough, shortness of breath and  wheezing.   Cardiovascular: Negative for chest pain and palpitations.  Gastrointestinal: as per HPI Genitourinary: Negative for dysuria, urgency, frequency and hematuria.  Musculoskeletal: Positive for myalgias, back pain and joint pain.  Positive for phantom leg pain Skin:  Negative for itching and rash.  Neurological: Negative for dizziness, tremors, focal weakness, seizures and loss of consciousness.  Endo/Heme/Allergies: Positive for seasonal allergies.  Psychiatric/Behavioral: Negative for depression, suicidal ideas and hallucinations.  All other systems reviewed and are negative.   Physical Exam: Vitals:   05/28/18 0937  BP: 136/90  Pulse: 76   There is no height or weight on file to calculate BMI. Gen:      No acute distress, bilateral above-knee amputation in wheelchair HEENT:  EOMI, sclera anicteric Neck:     No masses; no thyromegaly Lungs:    Clear to auscultation bilaterally; normal respiratory effort CV:         Regular rate and rhythm; no murmurs Abd:      + bowel sounds; soft, non-tender; no palpable masses, no distension Skin:      Warm and dry Neuro: alert and oriented x 3 Psych: normal mood and affect  Data Reviewed:  Reviewed labs, radiology imaging, old records and pertinent past GI work up   Assessment and Plan/Recommendations: 62 year old male with history of CVA, peripheral vascular disease, status post bilateral amputation here for management of C. difficile infection. He is currently on oral vancomycin 125 mg 4 times daily, 14 days course to be completed later this week.  He is doing well and denies any persistent diarrhea or symptoms to suggest complicated C. difficile infection Stop rifaximin no clear data to indicate that it prevents recurrent C. difficile, infact can lead to recurrent C. difficile infection Start Florastor (Saccharomyces boulardii )  after completion of vancomycin for 2 to 4 weeks Return as needed   K. Scherry Ran ,  MD [phone]    CC: Shelbie Ammons, MD

## 2018-05-30 ENCOUNTER — Encounter: Payer: Self-pay | Admitting: Gastroenterology

## 2019-07-31 ENCOUNTER — Ambulatory Visit: Payer: Medicaid Other | Admitting: Vascular Surgery

## 2019-09-04 ENCOUNTER — Other Ambulatory Visit: Payer: Self-pay

## 2019-09-04 ENCOUNTER — Encounter: Payer: Self-pay | Admitting: Vascular Surgery

## 2019-09-04 ENCOUNTER — Ambulatory Visit (INDEPENDENT_AMBULATORY_CARE_PROVIDER_SITE_OTHER): Payer: Medicare Other | Admitting: Vascular Surgery

## 2019-09-04 VITALS — BP 158/96 | Temp 98.2°F | Resp 18

## 2019-09-04 DIAGNOSIS — T8789 Other complications of amputation stump: Secondary | ICD-10-CM

## 2019-09-04 DIAGNOSIS — I739 Peripheral vascular disease, unspecified: Secondary | ICD-10-CM

## 2019-09-04 NOTE — Progress Notes (Signed)
Patient ID: MACK ALVIDREZ, male   DOB: 1955/08/11, 64 y.o.   MRN: [id number]  Reason for Consult: Follow-up   Referred by Bonnita Nasuti, MD  Subjective:     HPI:  ROEL DOUTHAT is a 64 y.o. male with previous history of bilateral above-knee amputations.  Most recently he underwent graft excision in his left groin.  Patient has previous history of stroke his brother who is now deceased was his history of provider in the past.  He is not accompanied by anyone today.  He cannot give me any recent history other than he still has a groin wound on the left.  Past Medical History:  Diagnosis Date  . Coronary artery disease   . Encephalopathy acute 07/2016  . HTN (hypertension)   . Hyperlipemia   . Myocardial infarction ([place])   . Non-healing surgical wound    left AKA  . Noncompliance   . PAD (peripheral artery disease) ([place])    a.  s/p left femoral to PT artery bypass with propaten on 02/16/16.   Family History  Problem Relation Age of Onset  . Cancer Mother        deceased in 16s   . Heart attack Mother        diseased  . Heart disease Father   . Stroke Sister        age 5s  . Heart disease Brother        pacemaker in his 59s   Past Surgical History:  Procedure Laterality Date  . ABDOMINAL AORTOGRAM W/LOWER EXTREMITY N/A 08/02/2016   Procedure: Abdominal Aortogram w/Lower Extremity;  Surgeon: Waynetta Sandy, MD;  Location: [place] CV LAB;  Service: Cardiovascular;  Laterality: N/A;  . ABOVE KNEE LEG AMPUTATION Right 12/04/2016  . AMPUTATION Left 06/25/2016   Procedure: AMPUTATION ABOVE KNEE;  Surgeon: Serafina Mitchell, MD;  Location: [place];  Service: Vascular;  Laterality: Left;  . AMPUTATION Right 12/04/2016   Procedure: RIGHT AMPUTATION ABOVE KNEE;  Surgeon: Waynetta Sandy, MD;  Location: [place];  Service: Vascular;  Laterality: Right;  . AMPUTATION Left 10/08/2017   Procedure: LEFT ABOVE KNEE AMPUTATION REVISION;  Surgeon: Waynetta Sandy, MD;  Location: [place];  Service: Vascular;  Laterality: Left;  . BYPASS GRAFT POPLITEAL TO TIBIAL Left 06/21/2016   Procedure: BYPASS GRAFT POPLITEAL TO TIBIAL USING GORE PROPATEN GRAFT;  Surgeon: Waynetta Sandy, MD;  Location: [place];  Service: Vascular;  Laterality: Left;  . EMBOLECTOMY Left 06/21/2016   Procedure: EMBOLECTOMY left lower extremity.;  Surgeon: Waynetta Sandy, MD;  Location: [place];  Service: Vascular;  Laterality: Left;  . ENDARTERECTOMY FEMORAL Left 02/16/2016   Procedure: LEFT FEMORAL ENDARTERECTOMY;  Surgeon: Waynetta Sandy, MD;  Location: [place];  Service: Vascular;  Laterality: Left;  . FEMORAL BYPASS  02/16/2016   LEFT FEMORAL-POSTERIOR TIBIAL ARTERY BYPASS  WITH PROPATEN 6 MM X 80 CM VASCULAR RING GRAFT (Left)  . FEMORAL-TIBIAL BYPASS GRAFT Left 02/16/2016   Procedure: LEFT FEMORAL-POSTERIOR TIBIAL ARTERY BYPASS  WITH PROPATEN 6 MM X 80 CM VASCULAR RING GRAFT;  Surgeon: Waynetta Sandy, MD;  Location: [place];  Service: Vascular;  Laterality: Left;  . FRACTURE SURGERY    . I & D EXTREMITY Left 09/03/2016   Procedure: IRRIGATION AND DEBRIDEMENT EXTREMITY - LEFT AKA;  Surgeon: Waynetta Sandy, MD;  Location: [place];  Service: Vascular;  Laterality: Left;  . I & D EXTREMITY Left 01/09/2018  Procedure: IRRIGATION AND DEBRIDEMENT GROIN;  Surgeon: Maeola Harman, MD;  Location: [facility];  Service: Vascular;  Laterality: Left;  . PATCH ANGIOPLASTY Left 02/16/2016   Procedure: PATCH ANGIOPLASTY WITH Livia Snellen BIOLOGIC PATCH 1 CM X 6 CM;  Surgeon: Maeola Harman, MD;  Location[facility];  Service: Vascular;  Laterality: Left;  . PATCH ANGIOPLASTY Left 01/09/2018   Procedure: PATCH ANGIOPLASTY OF LEFT COMMON FEMORAL AND PROFUNDA  USING SAPHENOUS VEIN;  Surgeon: Maeola Harman, MD;  [facility];  Service: Vascular;  Laterality: Left;  . PERIPHERAL VASCULAR BALLOON ANGIOPLASTY Right 08/02/2016   Procedure:  Peripheral Vascular Balloon Angioplasty;  Surgeon: Maeola Harman, MD;  [facility] CV LAB;  Service: Cardiovascular;  Laterality: Right;  peroneal artery  . PERIPHERAL VASCULAR CATHETERIZATION N/A 07/07/2015   Procedure: Abdominal Aortogram w/Lower Extremity;  Surgeon: Fransisco Hertz, MD;  Location: [facility] INV[facility] CV LAB;  Service: Cardiovascular;  Laterality: N/A;  . PERIPHERAL VASCULAR CATHETERIZATION N/A 02/15/2016   Procedure: Abdominal Aortogram w/Lower Extremity;  Surgeon: Maeola Harman, MD;  Location: [facility] INVASIVE CV LAB;  Service: Cardiovascular;  Laterality: N/A;  . PERIPHERAL VASCULAR CATHETERIZATION Bilateral 06/20/2016   Procedure: Lower Extremity Angiography;  Surgeon: Maeola Harman, MD;  Location[facility] INVASIVE CV LAB;  Service: Cardiovascular;  Laterality: Bilateral;  limited runoff on rt leg thrombolysis lt leg bypass graft  . PERIPHERAL VASCULAR INTERVENTION Right 08/02/2016   Procedure: Peripheral Vascular Intervention;  Surgeon: Maeola Harman, MD;  Location: [facility] INVASIVE CV LAB;  Service: Cardiovascular;  Laterality: Right;  Femoral , popliteal  . 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  Marland Kitchen VEIN HARVEST Left 01/09/2018   Procedure: LEFT SAPHENOUS VEIN HARVEST;  Surgeon: Maeola Harman, MD;  Location: [facility] OR;  Service: Vascular;  Laterality: Left;    Short Social History:  Social History   Tobacco Use  . Smoking status: Former Smoker    Packs/day: 3.00    Years: 40.00    Pack years: 120.00  . Smokeless tobacco: Never Used  . Tobacco comment: cutting back amount he is buying  Substance Use Topics  . Alcohol use: Not Currently    Alcohol/week: 14.0 standard drinks    Types: 14 Cans of beer per week    Comment: Drinks a 40 oz "tallboy" daily   [id number]  I quitdrinking    No Known Allergies  Current Outpatient Medications  Medication Sig Dispense Refill  . aspirin EC 81 MG EC  tablet Take 1 tablet (81 mg total) by mouth daily.    Marland Kitchen atorvastatin (LIPITOR) 40 MG tablet Take 1 tablet (40 mg total) by mouth daily at 6 PM. 30 tablet 0  . docusate sodium (COLACE) 100 MG capsule Take 1 capsule (100 mg total) by mouth daily. 10 capsule 0  . Multiple Vitamin (TAB-A-VITE PO) Take 1 tablet by mouth daily.    . Vitamin D, Ergocalciferol, (DRISDOL) 50000 units CAPS capsule Take 50,000 Units by mouth every 7 (seven) days.    . folic acid (FOLVITE) 1 MG tablet Take 1 tablet (1 mg total) by mouth daily. (Patient not taking: Reported on 09/04/2019)    . Lidocaine (ASPERCREME LIDOCAINE) 4 % PTCH Apply 1 patch topically daily. Apply to left shoulder (Patient not taking: Reported on 09/04/2019) 5 patch 0  . metoprolol tartrate (LOPRESSOR) 25 MG tablet Take 0.5 tablets (12.5 mg total) by mouth 2 (two) times daily. (Patient not taking: Reported  on 09/04/2019)    . mirtazapine (REMERON) 15 MG tablet Take 15 mg by mouth at bedtime.    Marland Kitchen oxyCODONE-acetaminophen (PERCOCET/ROXICET) 5-325 MG tablet Take 1-2 tablets by mouth every 6 (six) hours as needed for severe pain. (Patient not taking: Reported on 09/04/2019) 10 tablet 0   No current facility-administered medications for this visit.    REVIEW OF SYSTEMS  Patient unable to give detailed review of systems    Objective:  Objective   Vitals:   09/04/19 1229  BP: (!) 158/96  Resp: 18  Temp: 98.2 F (36.8 C)  SpO2: 94%   There is no height or weight on file to calculate BMI.  Physical Exam Constitutional:      Appearance: He is obese.  Abdominal:     General: Abdomen is flat.     Palpations: Abdomen is soft.  Musculoskeletal:     Cervical back: Normal range of motion and neck supple.     Comments: Bilateral above-knee amputations Left groin wound has heaped up granulation silver nitrate was applied  Neurological:     General: No focal deficit present.     Mental Status: He is alert.  Psychiatric:        Mood and Affect: Mood  normal.        Behavior: Behavior normal.        Thought Content: Thought content normal.        Judgment: Judgment normal.          Assessment/Plan:     64 year old male most recently underwent graft excision of the left groin.  Silver nitrate was applied.  He only needs dry dressings in the groin daily.  Last operation was in July 2019.  Wound in the left groin should heal with wound care.  He can follow-up on an as-needed basis.     Maeola Harman MD [facility]

## 2019-11-09 DIAGNOSIS — R4182 Altered mental status, unspecified: Secondary | ICD-10-CM | POA: Diagnosis not present

## 2019-11-09 DIAGNOSIS — R509 Fever, unspecified: Secondary | ICD-10-CM | POA: Diagnosis not present

## 2019-11-10 DIAGNOSIS — R4182 Altered mental status, unspecified: Secondary | ICD-10-CM | POA: Diagnosis not present

## 2019-11-10 DIAGNOSIS — R509 Fever, unspecified: Secondary | ICD-10-CM | POA: Diagnosis not present

## 2019-11-11 DIAGNOSIS — R4182 Altered mental status, unspecified: Secondary | ICD-10-CM | POA: Diagnosis not present

## 2019-11-11 DIAGNOSIS — R509 Fever, unspecified: Secondary | ICD-10-CM | POA: Diagnosis not present

## 2019-11-12 DIAGNOSIS — R4182 Altered mental status, unspecified: Secondary | ICD-10-CM | POA: Diagnosis not present

## 2019-11-12 DIAGNOSIS — R509 Fever, unspecified: Secondary | ICD-10-CM | POA: Diagnosis not present

## 2020-12-08 ENCOUNTER — Telehealth: Payer: Self-pay

## 2020-12-08 NOTE — Telephone Encounter (Signed)
SNF calls today to schedule an appt for patient. He has a chronic wound in his left groin that has been present since 2019. Per [facility] note, at the last visit in March 2021, groin should have healed with wound care - but is still open. Per the nurse "the skin has bubbled over a small tunnel and if you peel the skin back you'd see a pin sized hole." They have been using calcium alginate dressing and serosanguineous drainage present - no odor. Says an "ultrasound showed chronic surgical matter, which may be the stent causing the wound not to heal." Patient is not having fever or chills. Placed on schedule for evaluation tomorrow.

## 2020-12-09 ENCOUNTER — Other Ambulatory Visit: Payer: Self-pay

## 2020-12-09 ENCOUNTER — Encounter: Payer: Self-pay | Admitting: Vascular Surgery

## 2020-12-09 ENCOUNTER — Ambulatory Visit (INDEPENDENT_AMBULATORY_CARE_PROVIDER_SITE_OTHER): Payer: Medicare Other | Admitting: Vascular Surgery

## 2020-12-09 VITALS — BP 126/85 | HR 100 | Temp 98.1°F | Resp 20

## 2020-12-09 DIAGNOSIS — T8789 Other complications of amputation stump: Secondary | ICD-10-CM

## 2020-12-09 NOTE — Progress Notes (Signed)
Patient ID: Stephen Bowen, male   DOB: Sep 27, 1955, 65 y.o.   MRN: [id number]  Reason for Consult: Follow-up   Referred by Galvin Proffer, MD  Subjective:     HPI:  Stephen Bowen is a 65 y.o. male previous bilateral above-knee amputations.  He has had a wound in his left groin for many years we had previously excised the graft there placed a vein patch angioplasty on his common femoral artery.  He is now sent back for persistent drainage from the groin.  Patient does have limited ability to give history he is from the nursing home there is no representative.  Past Medical History:  Diagnosis Date   Coronary artery disease    Encephalopathy acute 07/2016   HTN (hypertension)    Hyperlipemia    Myocardial infarction [facility])    Non-healing surgical wound    left AKA   Noncompliance    PAD (peripheral artery disease) (HCC)    a.  s/p left femoral to PT artery bypass with propaten on 02/16/16.   Family History  Problem Relation Age of Onset   Cancer Mother        deceased in 66s    Heart attack Mother        diseased   Heart disease Father    Stroke Sister        age 38s   Heart disease Brother        pacemaker in his 71s   Past Surgical History:  Procedure Laterality Date   ABDOMINAL AORTOGRAM W/LOWER EXTREMITY N/A 08/02/2016   Procedure: Abdominal Aortogram w/Lower Extremity;  Surgeon: Maeola Harman, MD;  Location: [facility] INVASIVE CV LAB;  Service: Cardiovascular;  Laterality: N/A;   ABOVE KNEE LEG AMPUTATION Right 12/04/2016   AMPUTATION Left 06/25/2016   Procedure: AMPUTATION ABOVE KNEE;  Surgeon: Nada Libman, MD;  Location: [facility] OR;  Service: Vascular;  Laterality: Left;   AMPUTATION Right 12/04/2016   Procedure: RIGHT AMPUTATION ABOVE KNEE;  Surgeon: Maeola Harman, MD;  Location: [facility] OR;  Service: Vascular;  Laterality: Right;   AMPUTATION Left 10/08/2017   Procedure: LEFT ABOVE KNEE AMPUTATION REVISION;  Surgeon: Maeola Harman, MD;   Location: [facility] OR;  Service: Vascular;  Laterality: Left;   BYPASS GRAFT POPLITEAL TO TIBIAL Left 06/21/2016   Procedure: BYPASS GRAFT POPLITEAL TO TIBIAL USING GORE PROPATEN GRAFT;  Surgeon: Maeola Harman, MD;  Location: [facility] OR;  Service: Vascular;  Laterality: Left;   EMBOLECTOMY Left 06/21/2016   Procedure: EMBOLECTOMY left lower extremity.;  Surgeon: Maeola Harman, MD;  Location: [facility] OR;  Service: Vascular;  Laterality: Left;   ENDARTERECTOMY FEMORAL Left 02/16/2016   Procedure: LEFT FEMORAL ENDARTERECTOMY;  Surgeon: Maeola Harman, MD;  Location: [facility] OR;  Service: Vascular;  Laterality: Left;   FEMORAL BYPASS  02/16/2016   LEFT FEMORAL-POSTERIOR TIBIAL ARTERY BYPASS  WITH PROPATEN 6 MM X 80 CM VASCULAR RING GRAFT (Left)   FEMORAL-TIBIAL BYPASS GRAFT Left 02/16/2016   Procedure: LEFT FEMORAL-POSTERIOR TIBIAL ARTERY BYPASS  WITH PROPATEN 6 MM X 80 CM VASCULAR RING GRAFT;  Surgeon: Maeola Harman, MD;  Location: [facility] OR;  Service: Vascular;  Laterality: Left;   FRACTURE SURGERY     I & D EXTREMITY Left 09/03/2016   Procedure: IRRIGATION AND DEBRIDEMENT EXTREMITY - LEFT AKA;  Surgeon: Maeola Harman, MD;  Location: [facility] OR;  Service: Vascular;  Laterality: Left;   I & D EXTREMITY Left 01/09/2018   Procedure:  IRRIGATION AND DEBRIDEMENT GROIN;  Surgeon: Maeola Harman, MD;  Location: [facility] OR;  Service: Vascular;  Laterality: Left;   PATCH ANGIOPLASTY Left 02/16/2016   Procedure: PATCH ANGIOPLASTY WITH Livia Snellen BIOLOGIC PATCH 1 CM X 6 CM;  Surgeon: Maeola Harman, MD;  Location: [facility] OR;  Service: Vascular;  Laterality: Left;   PATCH ANGIOPLASTY Left 01/09/2018   Procedure: PATCH ANGIOPLASTY OF LEFT COMMON FEMORAL AND PROFUNDA  USING SAPHENOUS VEIN;  Surgeon: Maeola Harman, MD;  Location: [facility] OR;  Service: Vascular;  Laterality: Left;   PERIPHERAL VASCULAR BALLOON ANGIOPLASTY Right 08/02/2016   Procedure: Peripheral Vascular Balloon  Angioplasty;  Surgeon: Maeola Harman, MD;  Location: [facility] INVASIVE CV LAB;  Service: Cardiovascular;  Laterality: Right;  peroneal artery   PERIPHERAL VASCULAR CATHETERIZATION N/A 07/07/2015   Procedure: Abdominal Aortogram w/Lower Extremity;  Surgeon: Fransisco Hertz, MD;  Location: [facility] INVASIVE CV LAB;  Service: Cardiovascular;  Laterality: N/A;   PERIPHERAL VASCULAR CATHETERIZATION N/A 02/15/2016   Procedure: Abdominal Aortogram w/Lower Extremity;  Surgeon: Maeola Harman, MD;  Location: [facility] INVASIVE CV LAB;  Service: Cardiovascular;  Laterality: N/A;   PERIPHERAL VASCULAR CATHETERIZATION Bilateral 06/20/2016   Procedure: Lower Extremity Angiography;  Surgeon: Maeola Harman, MD;  Location: [facility] INVASIVE CV LAB;  Service: Cardiovascular;  Laterality: Bilateral;  limited runoff on rt leg thrombolysis lt leg bypass graft   PERIPHERAL VASCULAR INTERVENTION Right 08/02/2016   Procedure: Peripheral Vascular Intervention;  Surgeon: Maeola Harman, MD;  Location: [facility] INVASIVE CV LAB;  Service: Cardiovascular;  Laterality: Right;  Femoral , popliteal   TIBIA FRACTURE SURGERY Left 2000   per patient, he has "rod" inside his left leg.   VEIN HARVEST Left 01/09/2018   Procedure: LEFT SAPHENOUS VEIN HARVEST;  Surgeon: Maeola Harman, MD;  Location: [facility] OR;  Service: Vascular;  Laterality: Left;    Short Social History:  Social History   Tobacco Use   Smoking status: Former    Packs/day: 3.00    Years: 40.00    Pack years: 120.00    Types: Cigarettes   Smokeless tobacco: Never   Tobacco comments:    cutting back amount he is buying  Substance Use Topics   Alcohol use: Not Currently    Alcohol/week: 14.0 standard drinks    Types: 14 Cans of beer per week    Comment: Drinks a 40 oz "tallboy" daily   [id number]  I quitdrinking    No Known Allergies  Current Outpatient Medications  Medication Sig Dispense Refill   acetaminophen (TYLENOL) 325 MG tablet Take 650  mg by mouth every 6 (six) hours as needed.     aspirin EC 81 MG EC tablet Take 1 tablet (81 mg total) by mouth daily.     atorvastatin (LIPITOR) 40 MG tablet Take 1 tablet (40 mg total) by mouth daily at 6 PM. 30 tablet 0   docusate sodium (COLACE) 100 MG capsule Take 1 capsule (100 mg total) by mouth daily. 10 capsule 0   gabapentin (NEURONTIN) 100 MG capsule      metoprolol tartrate (LOPRESSOR) 25 MG tablet Take 0.5 tablets (12.5 mg total) by mouth 2 (two) times daily.     Vitamin D, Ergocalciferol, (DRISDOL) 50000 units CAPS capsule Take 50,000 Units by mouth every 7 (seven) days.     folic acid (FOLVITE) 1 MG tablet Take 1 tablet (1 mg total) by mouth daily. (Patient not taking: Reported on 09/04/2019)     Lidocaine (ASPERCREME LIDOCAINE) 4 %  PTCH Apply 1 patch topically daily. Apply to left shoulder (Patient not taking: Reported on 09/04/2019) 5 patch 0   mirtazapine (REMERON) 15 MG tablet Take 15 mg by mouth at bedtime. (Patient not taking: Reported on 12/09/2020)     Multiple Vitamin (TAB-A-VITE PO) Take 1 tablet by mouth daily. (Patient not taking: Reported on 12/09/2020)     oxyCODONE-acetaminophen (PERCOCET/ROXICET) 5-325 MG tablet Take 1-2 tablets by mouth every 6 (six) hours as needed for severe pain. (Patient not taking: No sig reported) 10 tablet 0   No current facility-administered medications for this visit.    Review of Systems  Constitutional:  Constitutional negative. HENT: HENT negative.  Eyes: Eyes negative.  Respiratory: Respiratory negative.  Cardiovascular: Cardiovascular negative.  GI: Gastrointestinal negative.  Musculoskeletal: Musculoskeletal negative.  Skin: Positive for wound.  Neurological: Neurological negative. Hematologic: Hematologic/lymphatic negative.  Psychiatric: Psychiatric negative.       Objective:  Objective   Vitals:   12/09/20 1417  BP: 126/85  Pulse: 100  Resp: 20  Temp: 98.1 F (36.7 C)  SpO2: 92%   There is no height or weight on  file to calculate BMI.  Physical Exam Constitutional:      Appearance: Normal appearance.  HENT:     Head: Normocephalic.     Nose:     Comments: Wearing a mask Eyes:     Pupils: Pupils are equal, round, and reactive to light.  Pulmonary:     Effort: Pulmonary effort is normal.  Abdominal:     General: Abdomen is flat.  Musculoskeletal:        General: Normal range of motion.  Skin:    Comments: Left groin wound with heaped up granulation  Neurological:     General: No focal deficit present.     Mental Status: He is alert.  Psychiatric:        Mood and Affect: Mood normal.        Behavior: Behavior normal.        Thought Content: Thought content normal.        Judgment: Judgment normal.   No studies     Assessment/Plan:     65 year old male with persistent left groin wound.  There is an area of heaped granulation that was cauterized with silver nitrate today.  I will have him back in a couple weeks for further evaluation.  If this continues to persist we will possibly need to take him to the operating room to debride it given that he has ongoing drainage there although there is no underlying graft.  I discussed this with him today I am unsure how much he understands but was also recorded on the nursing home notes.     Maeola Harman MD [facility]

## 2020-12-30 ENCOUNTER — Other Ambulatory Visit: Payer: Self-pay

## 2020-12-30 ENCOUNTER — Encounter: Payer: Self-pay | Admitting: Physician Assistant

## 2020-12-30 ENCOUNTER — Ambulatory Visit (INDEPENDENT_AMBULATORY_CARE_PROVIDER_SITE_OTHER): Payer: Medicare Other | Admitting: Physician Assistant

## 2020-12-30 VITALS — BP 138/82 | HR 83 | Temp 98.3°F | Resp 18

## 2020-12-30 DIAGNOSIS — T8789 Other complications of amputation stump: Secondary | ICD-10-CM | POA: Diagnosis not present

## 2020-12-30 NOTE — Progress Notes (Signed)
Office Note     CC:  follow up Requesting Provider:  Galvin Proffer, MD  HPI: Stephen Bowen is a 65 y.o. (May 07, 1956) male who presents for follow up wound check. He has history of bilateral above knee amputations and has had a left groin wound now for several years. This developed following excision of graft with vein patch angioplasty of his common femoral artery in 2019 By Dr. Randie Heinz. He was recently see on 6/17 by Dr. Randie Heinz due to drainage from this wound. He was found to have hypertrophic granulation tissue. This was cauterized with silver nitrate. He presents today for follow up   He reports not pain. He is not really able to tell me if there has been drainage. He just says they change the bandage daily. Patient does have limited ability to give history and he is from the nursing home and no one has accompanied him to his appointment  Past Medical History:  Diagnosis Date   Coronary artery disease    Encephalopathy acute 07/2016   HTN (hypertension)    Hyperlipemia    Myocardial infarction [facility])    Non-healing surgical wound    left AKA   Noncompliance    PAD (peripheral artery disease) (HCC)    a.  s/p left femoral to PT artery bypass with propaten on 02/16/16.    Past Surgical History:  Procedure Laterality Date   ABDOMINAL AORTOGRAM W/LOWER EXTREMITY N/A 08/02/2016   Procedure: Abdominal Aortogram w/Lower Extremity;  Surgeon: Maeola Harman, MD;  Location: [facility] INVASIVE CV LAB;  Service: Cardiovascular;  Laterality: N/A;   ABOVE KNEE LEG AMPUTATION Right 12/04/2016   AMPUTATION Left 06/25/2016   Procedure: AMPUTATION ABOVE KNEE;  Surgeon: Nada Libman, MD;  Location: [facility] OR;  Service: Vascular;  Laterality: Left;   AMPUTATION Right 12/04/2016   Procedure: RIGHT AMPUTATION ABOVE KNEE;  Surgeon: Maeola Harman, MD;  Location: [facility] OR;  Service: Vascular;  Laterality: Right;   AMPUTATION Left 10/08/2017   Procedure: LEFT ABOVE KNEE AMPUTATION REVISION;  Surgeon:  Maeola Harman, MD;  Location: [facility] OR;  Service: Vascular;  Laterality: Left;   BYPASS GRAFT POPLITEAL TO TIBIAL Left 06/21/2016   Procedure: BYPASS GRAFT POPLITEAL TO TIBIAL USING GORE PROPATEN GRAFT;  Surgeon: Maeola Harman, MD;  Location: [facility] OR;  Service: Vascular;  Laterality: Left;   EMBOLECTOMY Left 06/21/2016   Procedure: EMBOLECTOMY left lower extremity.;  Surgeon: Maeola Harman, MD;  Location: [facility] OR;  Service: Vascular;  Laterality: Left;   ENDARTERECTOMY FEMORAL Left 02/16/2016   Procedure: LEFT FEMORAL ENDARTERECTOMY;  Surgeon: Maeola Harman, MD;  Location: [facility] OR;  Service: Vascular;  Laterality: Left;   FEMORAL BYPASS  02/16/2016   LEFT FEMORAL-POSTERIOR TIBIAL ARTERY BYPASS  WITH PROPATEN 6 MM X 80 CM VASCULAR RING GRAFT (Left)   FEMORAL-TIBIAL BYPASS GRAFT Left 02/16/2016   Procedure: LEFT FEMORAL-POSTERIOR TIBIAL ARTERY BYPASS  WITH PROPATEN 6 MM X 80 CM VASCULAR RING GRAFT;  Surgeon: Maeola Harman, MD;  Location: [facility] OR;  Service: Vascular;  Laterality: Left;   FRACTURE SURGERY     I & D EXTREMITY Left 09/03/2016   Procedure: IRRIGATION AND DEBRIDEMENT EXTREMITY - LEFT AKA;  Surgeon: Maeola Harman, MD;  Location: [facility] OR;  Service: Vascular;  Laterality: Left;   I & D EXTREMITY Left 01/09/2018   Procedure: IRRIGATION AND DEBRIDEMENT GROIN;  Surgeon: Maeola Harman, MD;  Location: [facility] OR;  Service: Vascular;  Laterality: Left;   PATCH  ANGIOPLASTY Left 02/16/2016   Procedure: PATCH ANGIOPLASTY WITH Livia Snellen BIOLOGIC PATCH 1 CM X 6 CM;  Surgeon: Maeola Harman, MD;  Location: [facility] OR;  Service: Vascular;  Laterality: Left;   PATCH ANGIOPLASTY Left 01/09/2018   Procedure: PATCH ANGIOPLASTY OF LEFT COMMON FEMORAL AND PROFUNDA  USING SAPHENOUS VEIN;  Surgeon: Maeola Harman, MD;  Location: [facility] OR;  Service: Vascular;  Laterality: Left;   PERIPHERAL VASCULAR BALLOON ANGIOPLASTY Right 08/02/2016    Procedure: Peripheral Vascular Balloon Angioplasty;  Surgeon: Maeola Harman, MD;  Location: [facility] INVASIVE CV LAB;  Service: Cardiovascular;  Laterality: Right;  peroneal artery   PERIPHERAL VASCULAR CATHETERIZATION N/A 07/07/2015   Procedure: Abdominal Aortogram w/Lower Extremity;  Surgeon: Fransisco Hertz, MD;  Location: [facility] INVASIVE CV LAB;  Service: Cardiovascular;  Laterality: N/A;   PERIPHERAL VASCULAR CATHETERIZATION N/A 02/15/2016   Procedure: Abdominal Aortogram w/Lower Extremity;  Surgeon: Maeola Harman, MD;  Location: [facility] INVASIVE CV LAB;  Service: Cardiovascular;  Laterality: N/A;   PERIPHERAL VASCULAR CATHETERIZATION Bilateral 06/20/2016   Procedure: Lower Extremity Angiography;  Surgeon: Maeola Harman, MD;  Location: [facility] INVASIVE CV LAB;  Service: Cardiovascular;  Laterality: Bilateral;  limited runoff on rt leg thrombolysis lt leg bypass graft   PERIPHERAL VASCULAR INTERVENTION Right 08/02/2016   Procedure: Peripheral Vascular Intervention;  Surgeon: Maeola Harman, MD;  Location: [facility] INVASIVE CV LAB;  Service: Cardiovascular;  Laterality: Right;  Femoral , popliteal   TIBIA FRACTURE SURGERY Left 2000   per patient, he has "rod" inside his left leg.   VEIN HARVEST Left 01/09/2018   Procedure: LEFT SAPHENOUS VEIN HARVEST;  Surgeon: Maeola Harman, MD;  Location: [facility] OR;  Service: Vascular;  Laterality: Left;    Social History   Socioeconomic History   Marital status: Single    Spouse name: Not on file   Number of children: Not on file   Years of education: Not on file   Highest education level: Not on file  Occupational History   Occupation: Unemployed  Tobacco Use   Smoking status: Former    Packs/day: 3.00    Years: 40.00    Pack years: 120.00    Types: Cigarettes   Smokeless tobacco: Never   Tobacco comments:    cutting back amount he is buying  Vaping Use   Vaping Use: Never used  Substance and Sexual Activity   Alcohol  use: Not Currently    Alcohol/week: 14.0 standard drinks    Types: 14 Cans of beer per week    Comment: Drinks a 40 oz "tallboy" daily   [id number]  I quitdrinking   Drug use: No   Sexual activity: Yes    Birth control/protection: Condom  Other Topics Concern   Not on file  Social History Narrative   Smoking cigarettes since 65 y.o   Drinks beer 24 oz every other day    Social Determinants of Health   Financial Resource Strain: Not on file  Food Insecurity: Not on file  Transportation Needs: Not on file  Physical Activity: Not on file  Stress: Not on file  Social Connections: Not on file  Intimate Partner Violence: Not on file    Family History  Problem Relation Age of Onset   Cancer Mother        deceased in 10s    Heart attack Mother        diseased   Heart disease Father    Stroke Sister  age 44s   Heart disease Brother        pacemaker in his 62s    Current Outpatient Medications  Medication Sig Dispense Refill   acetaminophen (TYLENOL) 325 MG tablet Take 650 mg by mouth every 6 (six) hours as needed.     aspirin EC 81 MG EC tablet Take 1 tablet (81 mg total) by mouth daily.     atorvastatin (LIPITOR) 40 MG tablet Take 1 tablet (40 mg total) by mouth daily at 6 PM. 30 tablet 0   docusate sodium (COLACE) 100 MG capsule Take 1 capsule (100 mg total) by mouth daily. 10 capsule 0   gabapentin (NEURONTIN) 100 MG capsule      metoprolol tartrate (LOPRESSOR) 25 MG tablet Take 0.5 tablets (12.5 mg total) by mouth 2 (two) times daily.     folic acid (FOLVITE) 1 MG tablet Take 1 tablet (1 mg total) by mouth daily. (Patient not taking: No sig reported)     Lidocaine (ASPERCREME LIDOCAINE) 4 % PTCH Apply 1 patch topically daily. Apply to left shoulder (Patient not taking: No sig reported) 5 patch 0   mirtazapine (REMERON) 15 MG tablet Take 15 mg by mouth at bedtime. (Patient not taking: No sig reported)     Multiple Vitamin (TAB-A-VITE PO) Take 1 tablet by mouth daily.  (Patient not taking: No sig reported)     oxyCODONE-acetaminophen (PERCOCET/ROXICET) 5-325 MG tablet Take 1-2 tablets by mouth every 6 (six) hours as needed for severe pain. (Patient not taking: No sig reported) 10 tablet 0   Vitamin D, Ergocalciferol, (DRISDOL) 50000 units CAPS capsule Take 50,000 Units by mouth every 7 (seven) days.     No current facility-administered medications for this visit.    No Known Allergies   REVIEW OF SYSTEMS:  [X]  denotes positive finding, [ ]  denotes negative finding Cardiac  Comments:  Chest pain or chest pressure:    Shortness of breath upon exertion:    Short of breath when lying flat:    Irregular heart rhythm:        Vascular    Pain in calf, thigh, or hip brought on by ambulation:    Pain in feet at night that wakes you up from your sleep:     Blood clot in your veins:    Leg swelling:         Pulmonary    Oxygen at home:    Productive cough:     Wheezing:         Neurologic    Sudden weakness in arms or legs:     Sudden numbness in arms or legs:     Sudden onset of difficulty speaking or slurred speech:    Temporary loss of vision in one eye:     Problems with dizziness:         Gastrointestinal    Blood in stool:     Vomited blood:         Genitourinary    Burning when urinating:     Blood in urine:        Psychiatric    Major depression:         Hematologic    Bleeding problems:    Problems with blood clotting too easily:        Skin    Rashes or ulcers:        Constitutional    Fever or chills:      PHYSICAL EXAMINATION:  Vitals:   12/30/20  0953  BP: 138/82  Pulse: 83  Resp: 18  Temp: 98.3 F (36.8 C)  TempSrc: Temporal  SpO2: 95%    General:  WDWN in NAD; vital signs documented above Gait: Not observed, in wheel chair HENT: WNL, normocephalic Pulmonary: normal non-labored breathing  Cardiac: regular HR Abdomen: obese Extremities: bilateral aka well healed, left groin with small open wound with  exuberant granulation tissue.Scant purulent drainage present. No erythema or signs of infection. Silver nitrate applied. 2x2 gauze and tape applied   Musculoskeletal: no muscle wasting or atrophy  Neurologic: A&O X 3;  No focal weakness or paresthesias are detected Psychiatric:  The pt has Normal affect.  ASSESSMENT/PLAN:: 65 y.o. male here for follow up for wound check in left groin. Non healing left groin surgical wound. Some scant drainage present. No signs of infection surrounding. Silver nitrate applied again to exuberant granulation tissue. Will have him follow up in another 3-4 weeks to check wound. If continued issues with drainage may need explored in OR   Graceann Congressorrina Ahnika Hannibal, PA-C [facility] [phone]  On call  MD:   Dr. Lenell AntuHawken

## 2021-01-27 ENCOUNTER — Other Ambulatory Visit: Payer: Self-pay

## 2021-01-27 ENCOUNTER — Ambulatory Visit (INDEPENDENT_AMBULATORY_CARE_PROVIDER_SITE_OTHER): Payer: Medicare Other | Admitting: Physician Assistant

## 2021-01-27 ENCOUNTER — Encounter: Payer: Self-pay | Admitting: Physician Assistant

## 2021-01-27 VITALS — BP 157/90 | HR 88 | Temp 98.0°F | Resp 20

## 2021-01-27 DIAGNOSIS — T8789 Other complications of amputation stump: Secondary | ICD-10-CM | POA: Diagnosis not present

## 2021-01-27 DIAGNOSIS — I739 Peripheral vascular disease, unspecified: Secondary | ICD-10-CM

## 2021-01-27 MED ORDER — SULFAMETHOXAZOLE-TRIMETHOPRIM 800-160 MG PO TABS: 1.0000 | ORAL_TABLET | Freq: Two times a day (BID) | ORAL | 0 refills | Status: DC

## 2021-01-27 NOTE — H&P (View-Only) (Signed)
Office Note     CC:  follow up Requesting Provider:  Galvin ProfferHague, Imran P, MD  HPI: Stephen Bowen is a 65 y.o. (29-May-1956) male who presents for follow up wound check. We have been following a left groin wound for several hears now. He has history of bilateral above knee amputations and has had a left groin wound now for several years. This developed following excision of graft with vein patch angioplasty of his common femoral artery in 2019 By Dr. Randie Heinzain. At his last visit with myself on 12/30/20 He was found to have hypertrophic granulation tissue. This was cauterized with silver nitrate.   He presents today for follow up. He reports no pain. He does say that there has been increased drainage and redness but he is unable to tell me how long this has been like that. He just says the nurses change the bandage daily. Patient does have limited ability to give history and he is from the nursing home and no one has accompanied him to his appointment  Past Medical History:  Diagnosis Date   Coronary artery disease    Encephalopathy acute 07/2016   HTN (hypertension)    Hyperlipemia    Myocardial infarction [facility](HCC)    Non-healing surgical wound    left AKA   Noncompliance    PAD (peripheral artery disease) (HCC)    a.  s/p left femoral to PT artery bypass with propaten on 02/16/16.    Past Surgical History:  Procedure Laterality Date   ABDOMINAL AORTOGRAM W/LOWER EXTREMITY N/A 08/02/2016   Procedure: Abdominal Aortogram w/Lower Extremity;  Surgeon: Maeola HarmanBrandon Christopher Cain, MD;  [facility];  Service: Cardiovascular;  Laterality: N/A;   ABOVE KNEE LEG AMPUTATION Right 12/04/2016   AMPUTATION Left 06/25/2016   Procedure: AMPUTATION ABOVE KNEE;  Surgeon: Nada LibmanVance W Brabham, MD;  [facility];  Service: Vascular;  Laterality: Left;   AMPUTATION Right 12/04/2016   Procedure: RIGHT AMPUTATION ABOVE KNEE;  Surgeon: Maeola Harmanain, Brandon Christopher, MD;  [facility];  Service: Vascular;   Laterality: Right;   AMPUTATION Left 10/08/2017   Procedure: LEFT ABOVE KNEE AMPUTATION REVISION;  Surgeon: Maeola Harmanain, Brandon Christopher, MD;  [facility];  Service: Vascular;  Laterality: Left;   BYPASS GRAFT POPLITEAL TO TIBIAL Left 06/21/2016   Procedure: BYPASS GRAFT POPLITEAL TO TIBIAL USING GORE PROPATEN GRAFT;  Surgeon: Maeola HarmanBrandon Christopher Cain, MD;  [facility];  Service: Vascular;  Laterality: Left;   EMBOLECTOMY Left 06/21/2016   Procedure: EMBOLECTOMY left lower extremity.;  Surgeon: Maeola HarmanBrandon Christopher Cain, MD;  [facility];  Service: Vascular;  Laterality: Left;   ENDARTERECTOMY FEMORAL Left 02/16/2016   Procedure: LEFT FEMORAL ENDARTERECTOMY;  Surgeon: Maeola HarmanBrandon Christopher Cain, MD;  [facility];  Service: Vascular;  Laterality: Left;   FEMORAL BYPASS  02/16/2016   LEFT FEMORAL-POSTERIOR TIBIAL ARTERY BYPASS  WITH PROPATEN 6 MM X 80 CM VASCULAR RING GRAFT (Left)   FEMORAL-TIBIAL BYPASS GRAFT Left 02/16/2016   Procedure: LEFT FEMORAL-POSTERIOR TIBIAL ARTERY BYPASS  WITH PROPATEN 6 MM X 80 CM VASCULAR RING GRAFT;  Surgeon: Maeola HarmanBrandon Christopher Cain, MD;  [facility];  Service: Vascular;  Laterality: Left;   FRACTURE SURGERY     I & D EXTREMITY Left 09/03/2016   Procedure: IRRIGATION AND DEBRIDEMENT EXTREMITY - LEFT AKA;  Surgeon: Maeola HarmanBrandon Christopher Cain, MD;  [facility];  Service: Vascular;  Laterality: Left;   I & D EXTREMITY Left 01/09/2018   Procedure: IRRIGATION AND DEBRIDEMENT GROIN;  Surgeon: Maeola Harman, MD;  Location: [facility] OR;  Service: Vascular;  Laterality: Left;   PATCH ANGIOPLASTY Left 02/16/2016   Procedure: PATCH ANGIOPLASTY WITH Livia Snellen BIOLOGIC PATCH 1 CM X 6 CM;  Surgeon: Maeola Harman, MD;  Location: [facility] OR;  Service: Vascular;  Laterality: Left;   PATCH ANGIOPLASTY Left 01/09/2018   Procedure: PATCH ANGIOPLASTY OF LEFT COMMON FEMORAL AND PROFUNDA  USING SAPHENOUS VEIN;  Surgeon: Maeola Harman, MD;  Location: [facility] OR;   Service: Vascular;  Laterality: Left;   PERIPHERAL VASCULAR BALLOON ANGIOPLASTY Right 08/02/2016   Procedure: Peripheral Vascular Balloon Angioplasty;  Surgeon: Maeola Harman, MD;  Location: [facility] INVASIVE CV LAB;  Service: Cardiovascular;  Laterality: Right;  peroneal artery   PERIPHERAL VASCULAR CATHETERIZATION N/A 07/07/2015   Procedure: Abdominal Aortogram w/Lower Extremity;  Surgeon: Fransisco Hertz, MD;  Location: [facility] INVASIVE CV LAB;  Service: Cardiovascular;  Laterality: N/A;   PERIPHERAL VASCULAR CATHETERIZATION N/A 02/15/2016   Procedure: Abdominal Aortogram w/Lower Extremity;  Surgeon: Maeola Harman, MD;  Location: [facility] INVASIVE CV LAB;  Service: Cardiovascular;  Laterality: N/A;   PERIPHERAL VASCULAR CATHETERIZATION Bilateral 06/20/2016   Procedure: Lower Extremity Angiography;  Surgeon: Maeola Harman, MD;  Location: [facility] INVASIVE CV LAB;  Service: Cardiovascular;  Laterality: Bilateral;  limited runoff on rt leg thrombolysis lt leg bypass graft   PERIPHERAL VASCULAR INTERVENTION Right 08/02/2016   Procedure: Peripheral Vascular Intervention;  Surgeon: Maeola Harman, MD;  Location: [facility] INVASIVE CV LAB;  Service: Cardiovascular;  Laterality: Right;  Femoral , popliteal   TIBIA FRACTURE SURGERY Left 2000   per patient, he has "rod" inside his left leg.   VEIN HARVEST Left 01/09/2018   Procedure: LEFT SAPHENOUS VEIN HARVEST;  Surgeon: Maeola Harman, MD;  Location: [facility] OR;  Service: Vascular;  Laterality: Left;    Social History   Socioeconomic History   Marital status: Single    Spouse name: Not on file   Number of children: Not on file   Years of education: Not on file   Highest education level: Not on file  Occupational History   Occupation: Unemployed  Tobacco Use   Smoking status: Former    Packs/day: 3.00    Years: 40.00    Pack years: 120.00    Types: Cigarettes   Smokeless tobacco: Never   Tobacco comments:    cutting back amount  he is buying  Vaping Use   Vaping Use: Never used  Substance and Sexual Activity   Alcohol use: Not Currently    Alcohol/week: 14.0 standard drinks    Types: 14 Cans of beer per week    Comment: Drinks a 40 oz "tallboy" daily   [id number]  I quitdrinking   Drug use: No   Sexual activity: Yes    Birth control/protection: Condom  Other Topics Concern   Not on file  Social History Narrative   Smoking cigarettes since 65 y.o   Drinks beer 24 oz every other day    Social Determinants of Health   Financial Resource Strain: Not on file  Food Insecurity: Not on file  Transportation Needs: Not on file  Physical Activity: Not on file  Stress: Not on file  Social Connections: Not on file  Intimate Partner Violence: Not on file    Family History  Problem Relation Age of Onset   Cancer Mother        deceased in 61s    Heart attack Mother  diseased   Heart disease Father    Stroke Sister        age 78s   Heart disease Brother        pacemaker in his 73s    Current Outpatient Medications  Medication Sig Dispense Refill   acetaminophen (TYLENOL) 325 MG tablet Take 650 mg by mouth every 6 (six) hours as needed.     aspirin EC 81 MG EC tablet Take 1 tablet (81 mg total) by mouth daily.     atorvastatin (LIPITOR) 40 MG tablet Take 1 tablet (40 mg total) by mouth daily at 6 PM. 30 tablet 0   docusate sodium (COLACE) 100 MG capsule Take 1 capsule (100 mg total) by mouth daily. 10 capsule 0   folic acid (FOLVITE) 1 MG tablet Take 1 tablet (1 mg total) by mouth daily.     gabapentin (NEURONTIN) 100 MG capsule      Lidocaine (ASPERCREME LIDOCAINE) 4 % PTCH Apply 1 patch topically daily. Apply to left shoulder 5 patch 0   metoprolol tartrate (LOPRESSOR) 25 MG tablet Take 0.5 tablets (12.5 mg total) by mouth 2 (two) times daily.     mirtazapine (REMERON) 15 MG tablet Take 15 mg by mouth at bedtime.     Multiple Vitamin (TAB-A-VITE PO) Take 1 tablet by mouth daily.      oxyCODONE-acetaminophen (PERCOCET/ROXICET) 5-325 MG tablet Take 1-2 tablets by mouth every 6 (six) hours as needed for severe pain. 10 tablet 0   Vitamin D, Ergocalciferol, (DRISDOL) 50000 units CAPS capsule Take 50,000 Units by mouth every 7 (seven) days.     No current facility-administered medications for this visit.    No Known Allergies   REVIEW OF SYSTEMS:  [X]  denotes positive finding, [ ]  denotes negative finding Cardiac  Comments:  Chest pain or chest pressure:    Shortness of breath upon exertion:    Short of breath when lying flat:    Irregular heart rhythm:        Vascular    Pain in calf, thigh, or hip brought on by ambulation:    Pain in feet at night that wakes you up from your sleep:     Blood clot in your veins:    Leg swelling:         Pulmonary    Oxygen at home:    Productive cough:     Wheezing:         Neurologic    Sudden weakness in arms or legs:     Sudden numbness in arms or legs:     Sudden onset of difficulty speaking or slurred speech:    Temporary loss of vision in one eye:     Problems with dizziness:         Gastrointestinal    Blood in stool:     Vomited blood:         Genitourinary    Burning when urinating:     Blood in urine:        Psychiatric    Major depression:         Hematologic    Bleeding problems:    Problems with blood clotting too easily:        Skin    Rashes or ulcers:        Constitutional    Fever or chills:      PHYSICAL EXAMINATION:  Vitals:   01/27/21 1038  BP: (!) 157/90  Pulse: 88  Resp: 20  Temp: 98 F (36.7 C)  TempSrc: Temporal  SpO2: 96%    General:  WDWN in NAD; vital signs documented above Gait: Not observed, in wheel chair HENT: WNL, normocephalic Pulmonary: normal non-labored breathing , without wheezing Cardiac:  regular rate and rhythm Vascular Exam/Pulses: Bilateral AKAs well healed Extremities: without ischemic changes, without Gangrene , without cellulitis; with open wound  of the left groin. Purulent drainage present and increased on compression. Surrounding erythematous macular rash    Musculoskeletal: no muscle wasting or atrophy  Neurologic: A&O X 3;  No focal weakness or paresthesias are detected Psychiatric:  The pt has Normal affect.   ASSESSMENT/PLAN:: 65 y.o. male here for follow up for left groin wound. Wound has been present since 2019 following excision of graft with vein patch angioplasty of his common femoral artery by Dr. Randie Heinz. Wound initially healed but more recently started draining again in June. Attempted to improve area of hypertrophic granulation tissue with silver nitrate. However he returns with increased purulent drainage and redness. Recommendation is for further exploration of the wound in the OR. Dr. Randie Heinz evaluated the patient and agrees with plan to take him to the operating room for I&D. -Prescription for Bactrim 800-160 mg BID #28 no refills printed for patient - Will have him scheduled for I&D in OR with Dr. Randie Heinz on 02/08/21   Graceann Congress, PA-C [facility] [phone]  Clinic MD:   Randie Heinz

## 2021-01-27 NOTE — Progress Notes (Signed)
Office Note     CC:  follow up Requesting Provider:  Hague, Imran P, MD  HPI: Stephen Bowen is a 64 y.o. (04/01/1956) male who presents for follow up wound check. We have been following a left groin wound for several hears now. He has history of bilateral above knee amputations and has had a left groin wound now for several years. This developed following excision of graft with vein patch angioplasty of his common femoral artery in 2019 By Dr. Cain. At his last visit with myself on 12/30/20 He was found to have hypertrophic granulation tissue. This was cauterized with silver nitrate.   He presents today for follow up. He reports no pain. He does say that there has been increased drainage and redness but he is unable to tell me how long this has been like that. He just says the nurses change the bandage daily. Patient does have limited ability to give history and he is from the nursing home and no one has accompanied him to his appointment  Past Medical History:  Diagnosis Date   Coronary artery disease    Encephalopathy acute 07/2016   HTN (hypertension)    Hyperlipemia    Myocardial infarction (HCC)    Non-healing surgical wound    left AKA   Noncompliance    PAD (peripheral artery disease) (HCC)    a.  s/p left femoral to PT artery bypass with propaten on 02/16/16.    Past Surgical History:  Procedure Laterality Date   ABDOMINAL AORTOGRAM W/LOWER EXTREMITY N/A 08/02/2016   Procedure: Abdominal Aortogram w/Lower Extremity;  Surgeon: Brandon Christopher Cain, MD;  [facility]: [facility] INVAS[facility] CV LAB;  Service: Cardiovascular;  Laterality: N/A;   ABOVE KNEE LEG AMPUTATION Right 12/04/2016   AMPUTATION Left 06/25/2016   Procedure: AMPUTATION ABOVE KNEE;  Surgeon: Vance W Brabham, MD;  Location: [facility] OR;  Service: Vascular;  Laterality: Left;   AMPUTATION Right 12/04/2016   Procedure: RIGHT AMPUTATION ABOVE KNEE;  Surgeon: Cain, Brandon Christopher, MD;  Location: [facility] OR;  Service: Vascular;   Laterality: Right;   AMPUTATION Left 10/08/2017   Procedure: LEFT ABOVE KNEE AMPUTATION REVISION;  Surgeon: Cain, Brandon Christopher, MD;  Location: [facility] OR;  Service: Vascular;  Laterality: Left;   BYPASS GRAFT POPLITEAL TO TIBIAL Left 06/21/2016   Procedure: BYPASS GRAFT POPLITEAL TO TIBIAL USING GORE PROPATEN GRAFT;  Surgeon: Brandon Christopher Cain, MD;  Location: [facility] OR;  Service: Vascular;  Laterality: Left;   EMBOLECTOMY Left 06/21/2016   Procedure: EMBOLECTOMY left lower extremity.;  Surgeon: Brandon Christopher Cain, MD;  Location: [facility] OR;  Service: Vascular;  Laterality: Left;   ENDARTERECTOMY FEMORAL Left 02/16/2016   Procedure: LEFT FEMORAL ENDARTERECTOMY;  Surgeon: Brandon Christopher Cain, MD;  Location: [facility] OR;  Service: Vascular;  Laterality: Left;   FEMORAL BYPASS  02/16/2016   LEFT FEMORAL-POSTERIOR TIBIAL ARTERY BYPASS  WITH PROPATEN 6 MM X 80 CM VASCULAR RING GRAFT (Left)   FEMORAL-TIBIAL BYPASS GRAFT Left 02/16/2016   Procedure: LEFT FEMORAL-POSTERIOR TIBIAL ARTERY BYPASS  WITH PROPATEN 6 MM X 80 CM VASCULAR RING GRAFT;  Surgeon: Brandon Christopher Cain, MD;  Location: [facility] OR;  Service: Vascular;  Laterality: Left;   FRACTURE SURGERY     I & D EXTREMITY Left 09/03/2016   Procedure: IRRIGATION AND DEBRIDEMENT EXTREMITY - LEFT AKA;  Surgeon: Brandon Christopher Cain, MD;  Location: [facility] OR;  Service: Vascular;  Laterality: Left;   I & D EXTREMITY Left 01/09/2018   Procedure: IRRIGATION AND DEBRIDEMENT GROIN;    Surgeon: Maeola Harman, MD;  Location: [facility] OR;  Service: Vascular;  Laterality: Left;   PATCH ANGIOPLASTY Left 02/16/2016   Procedure: PATCH ANGIOPLASTY WITH Livia Snellen BIOLOGIC PATCH 1 CM X 6 CM;  Surgeon: Maeola Harman, MD;  Location: [facility] OR;  Service: Vascular;  Laterality: Left;   PATCH ANGIOPLASTY Left 01/09/2018   Procedure: PATCH ANGIOPLASTY OF LEFT COMMON FEMORAL AND PROFUNDA  USING SAPHENOUS VEIN;  Surgeon: Maeola Harman, MD;  Location: [facility] OR;   Service: Vascular;  Laterality: Left;   PERIPHERAL VASCULAR BALLOON ANGIOPLASTY Right 08/02/2016   Procedure: Peripheral Vascular Balloon Angioplasty;  Surgeon: Maeola Harman, MD;  Location: [facility] INVASIVE CV LAB;  Service: Cardiovascular;  Laterality: Right;  peroneal artery   PERIPHERAL VASCULAR CATHETERIZATION N/A 07/07/2015   Procedure: Abdominal Aortogram w/Lower Extremity;  Surgeon: Fransisco Hertz, MD;  Location: [facility] INVASIVE CV LAB;  Service: Cardiovascular;  Laterality: N/A;   PERIPHERAL VASCULAR CATHETERIZATION N/A 02/15/2016   Procedure: Abdominal Aortogram w/Lower Extremity;  Surgeon: Maeola Harman, MD;  Location: [facility] INVASIVE CV LAB;  Service: Cardiovascular;  Laterality: N/A;   PERIPHERAL VASCULAR CATHETERIZATION Bilateral 06/20/2016   Procedure: Lower Extremity Angiography;  Surgeon: Maeola Harman, MD;  Location: [facility] INVASIVE CV LAB;  Service: Cardiovascular;  Laterality: Bilateral;  limited runoff on rt leg thrombolysis lt leg bypass graft   PERIPHERAL VASCULAR INTERVENTION Right 08/02/2016   Procedure: Peripheral Vascular Intervention;  Surgeon: Maeola Harman, MD;  Location: [facility] INVASIVE CV LAB;  Service: Cardiovascular;  Laterality: Right;  Femoral , popliteal   TIBIA FRACTURE SURGERY Left 2000   per patient, he has "rod" inside his left leg.   VEIN HARVEST Left 01/09/2018   Procedure: LEFT SAPHENOUS VEIN HARVEST;  Surgeon: Maeola Harman, MD;  Location: [facility] OR;  Service: Vascular;  Laterality: Left;    Social History   Socioeconomic History   Marital status: Single    Spouse name: Not on file   Number of children: Not on file   Years of education: Not on file   Highest education level: Not on file  Occupational History   Occupation: Unemployed  Tobacco Use   Smoking status: Former    Packs/day: 3.00    Years: 40.00    Pack years: 120.00    Types: Cigarettes   Smokeless tobacco: Never   Tobacco comments:    cutting back amount  he is buying  Vaping Use   Vaping Use: Never used  Substance and Sexual Activity   Alcohol use: Not Currently    Alcohol/week: 14.0 standard drinks    Types: 14 Cans of beer per week    Comment: Drinks a 40 oz "tallboy" daily   [id number]  I quitdrinking   Drug use: No   Sexual activity: Yes    Birth control/protection: Condom  Other Topics Concern   Not on file  Social History Narrative   Smoking cigarettes since 65 y.o   Drinks beer 24 oz every other day    Social Determinants of Health   Financial Resource Strain: Not on file  Food Insecurity: Not on file  Transportation Needs: Not on file  Physical Activity: Not on file  Stress: Not on file  Social Connections: Not on file  Intimate Partner Violence: Not on file    Family History  Problem Relation Age of Onset   Cancer Mother        deceased in 61s    Heart attack Mother  diseased   Heart disease Father    Stroke Sister        age 78s   Heart disease Brother        pacemaker in his 73s    Current Outpatient Medications  Medication Sig Dispense Refill   acetaminophen (TYLENOL) 325 MG tablet Take 650 mg by mouth every 6 (six) hours as needed.     aspirin EC 81 MG EC tablet Take 1 tablet (81 mg total) by mouth daily.     atorvastatin (LIPITOR) 40 MG tablet Take 1 tablet (40 mg total) by mouth daily at 6 PM. 30 tablet 0   docusate sodium (COLACE) 100 MG capsule Take 1 capsule (100 mg total) by mouth daily. 10 capsule 0   folic acid (FOLVITE) 1 MG tablet Take 1 tablet (1 mg total) by mouth daily.     gabapentin (NEURONTIN) 100 MG capsule      Lidocaine (ASPERCREME LIDOCAINE) 4 % PTCH Apply 1 patch topically daily. Apply to left shoulder 5 patch 0   metoprolol tartrate (LOPRESSOR) 25 MG tablet Take 0.5 tablets (12.5 mg total) by mouth 2 (two) times daily.     mirtazapine (REMERON) 15 MG tablet Take 15 mg by mouth at bedtime.     Multiple Vitamin (TAB-A-VITE PO) Take 1 tablet by mouth daily.      oxyCODONE-acetaminophen (PERCOCET/ROXICET) 5-325 MG tablet Take 1-2 tablets by mouth every 6 (six) hours as needed for severe pain. 10 tablet 0   Vitamin D, Ergocalciferol, (DRISDOL) 50000 units CAPS capsule Take 50,000 Units by mouth every 7 (seven) days.     No current facility-administered medications for this visit.    No Known Allergies   REVIEW OF SYSTEMS:  [X]  denotes positive finding, [ ]  denotes negative finding Cardiac  Comments:  Chest pain or chest pressure:    Shortness of breath upon exertion:    Short of breath when lying flat:    Irregular heart rhythm:        Vascular    Pain in calf, thigh, or hip brought on by ambulation:    Pain in feet at night that wakes you up from your sleep:     Blood clot in your veins:    Leg swelling:         Pulmonary    Oxygen at home:    Productive cough:     Wheezing:         Neurologic    Sudden weakness in arms or legs:     Sudden numbness in arms or legs:     Sudden onset of difficulty speaking or slurred speech:    Temporary loss of vision in one eye:     Problems with dizziness:         Gastrointestinal    Blood in stool:     Vomited blood:         Genitourinary    Burning when urinating:     Blood in urine:        Psychiatric    Major depression:         Hematologic    Bleeding problems:    Problems with blood clotting too easily:        Skin    Rashes or ulcers:        Constitutional    Fever or chills:      PHYSICAL EXAMINATION:  Vitals:   01/27/21 1038  BP: (!) 157/90  Pulse: 88  Resp: 20  Temp: 98 F (36.7 C)  TempSrc: Temporal  SpO2: 96%    General:  WDWN in NAD; vital signs documented above Gait: Not observed, in wheel chair HENT: WNL, normocephalic Pulmonary: normal non-labored breathing , without wheezing Cardiac:  regular rate and rhythm Vascular Exam/Pulses: Bilateral AKAs well healed Extremities: without ischemic changes, without Gangrene , without cellulitis; with open wound  of the left groin. Purulent drainage present and increased on compression. Surrounding erythematous macular rash    Musculoskeletal: no muscle wasting or atrophy  Neurologic: A&O X 3;  No focal weakness or paresthesias are detected Psychiatric:  The pt has Normal affect.   ASSESSMENT/PLAN:: 65 y.o. male here for follow up for left groin wound. Wound has been present since 2019 following excision of graft with vein patch angioplasty of his common femoral artery by Dr. Randie Heinz. Wound initially healed but more recently started draining again in June. Attempted to improve area of hypertrophic granulation tissue with silver nitrate. However he returns with increased purulent drainage and redness. Recommendation is for further exploration of the wound in the OR. Dr. Randie Heinz evaluated the patient and agrees with plan to take him to the operating room for I&D. -Prescription for Bactrim 800-160 mg BID #28 no refills printed for patient - Will have him scheduled for I&D in OR with Dr. Randie Heinz on 02/08/21   Graceann Congress, PA-C [facility] [phone]  Clinic MD:   Randie Heinz

## 2021-02-08 ENCOUNTER — Other Ambulatory Visit: Payer: Self-pay

## 2021-02-08 ENCOUNTER — Encounter (HOSPITAL_COMMUNITY): Payer: Self-pay | Admitting: Vascular Surgery

## 2021-02-08 NOTE — Progress Notes (Addendum)
Spoke with Stephen Bowen for [facility] and went over Pt.'s health history and instructions for day of surgery.  Pt. Was covid positive July 18.

## 2021-02-09 ENCOUNTER — Ambulatory Visit (HOSPITAL_COMMUNITY): Payer: Medicare Other | Admitting: Anesthesiology

## 2021-02-09 ENCOUNTER — Encounter (HOSPITAL_COMMUNITY): Payer: Self-pay | Admitting: Vascular Surgery

## 2021-02-09 ENCOUNTER — Inpatient Hospital Stay (HOSPITAL_COMMUNITY)
Admission: RE | Admit: 2021-02-09 | Discharge: 2021-02-15 | DRG: 857 | Disposition: A | Discharge status: Skilled Nursing Facility | Payer: Medicare Other | Attending: Vascular Surgery | Admitting: Vascular Surgery

## 2021-02-09 ENCOUNTER — Encounter (HOSPITAL_COMMUNITY)
Admission: RE | Disposition: A | Discharge status: Skilled Nursing Facility | Payer: Self-pay | Source: Home / Self Care | Attending: Vascular Surgery

## 2021-02-09 DIAGNOSIS — I9751 Accidental puncture and laceration of a circulatory system organ or structure during a circulatory system procedure: Secondary | ICD-10-CM | POA: Diagnosis present

## 2021-02-09 DIAGNOSIS — Z8673 Personal history of transient ischemic attack (TIA), and cerebral infarction without residual deficits: Secondary | ICD-10-CM | POA: Diagnosis not present

## 2021-02-09 DIAGNOSIS — Z89611 Acquired absence of right leg above knee: Secondary | ICD-10-CM

## 2021-02-09 DIAGNOSIS — I10 Essential (primary) hypertension: Secondary | ICD-10-CM | POA: Diagnosis present

## 2021-02-09 DIAGNOSIS — I2510 Atherosclerotic heart disease of native coronary artery without angina pectoris: Secondary | ICD-10-CM | POA: Diagnosis present

## 2021-02-09 DIAGNOSIS — I252 Old myocardial infarction: Secondary | ICD-10-CM | POA: Diagnosis not present

## 2021-02-09 DIAGNOSIS — M96821 Accidental puncture and laceration of a musculoskeletal structure during other procedure: Secondary | ICD-10-CM | POA: Diagnosis present

## 2021-02-09 DIAGNOSIS — Y838 Other surgical procedures as the cause of abnormal reaction of the patient, or of later complication, without mention of misadventure at the time of the procedure: Secondary | ICD-10-CM | POA: Diagnosis present

## 2021-02-09 DIAGNOSIS — T8149XA Infection following a procedure, other surgical site, initial encounter: Secondary | ICD-10-CM | POA: Diagnosis present

## 2021-02-09 DIAGNOSIS — Z823 Family history of stroke: Secondary | ICD-10-CM

## 2021-02-09 DIAGNOSIS — F17210 Nicotine dependence, cigarettes, uncomplicated: Secondary | ICD-10-CM | POA: Diagnosis present

## 2021-02-09 DIAGNOSIS — M8640 Chronic osteomyelitis with draining sinus, unspecified site: Secondary | ICD-10-CM

## 2021-02-09 DIAGNOSIS — R000 Tachycardia, unspecified: Secondary | ICD-10-CM | POA: Diagnosis not present

## 2021-02-09 DIAGNOSIS — Z79899 Other long term (current) drug therapy: Secondary | ICD-10-CM | POA: Diagnosis not present

## 2021-02-09 DIAGNOSIS — Z7982 Long term (current) use of aspirin: Secondary | ICD-10-CM

## 2021-02-09 DIAGNOSIS — E785 Hyperlipidemia, unspecified: Secondary | ICD-10-CM | POA: Diagnosis present

## 2021-02-09 DIAGNOSIS — Z20822 Contact with and (suspected) exposure to covid-19: Secondary | ICD-10-CM | POA: Diagnosis present

## 2021-02-09 DIAGNOSIS — Z89612 Acquired absence of left leg above knee: Secondary | ICD-10-CM | POA: Diagnosis not present

## 2021-02-09 DIAGNOSIS — Z8249 Family history of ischemic heart disease and other diseases of the circulatory system: Secondary | ICD-10-CM | POA: Diagnosis not present

## 2021-02-09 DIAGNOSIS — L089 Local infection of the skin and subcutaneous tissue, unspecified: Secondary | ICD-10-CM

## 2021-02-09 DIAGNOSIS — I70202 Unspecified atherosclerosis of native arteries of extremities, left leg: Secondary | ICD-10-CM | POA: Diagnosis present

## 2021-02-09 DIAGNOSIS — I739 Peripheral vascular disease, unspecified: Secondary | ICD-10-CM | POA: Diagnosis present

## 2021-02-09 HISTORY — DX: Unspecified dementia, unspecified severity, without behavioral disturbance, psychotic disturbance, mood disturbance, and anxiety: F03.90

## 2021-02-09 HISTORY — PX: APPLICATION OF WOUND VAC: SHX5189

## 2021-02-09 HISTORY — PX: INCISION AND DRAINAGE: SHX5863

## 2021-02-09 LAB — BASIC METABOLIC PANEL
Anion gap: 12 (ref 5–15)
BUN: 19 mg/dL (ref 8–23)
CO2: 21 mmol/L — ABNORMAL LOW (ref 22–32)
Calcium: 8.9 mg/dL (ref 8.9–10.3)
Chloride: 101 mmol/L (ref 98–111)
Creatinine, Ser: 1.27 mg/dL — ABNORMAL HIGH (ref 0.61–1.24)
GFR, Estimated: >60 mL/min (ref >60)
Glucose, Bld: 110 mg/dL — ABNORMAL HIGH (ref 70–99)
Potassium: 5.4 mmol/L — ABNORMAL HIGH (ref 3.5–5.1)
Sodium: 134 mmol/L — ABNORMAL LOW (ref 135–145)

## 2021-02-09 LAB — CBC
HCT: 47.3 % (ref 39.0–52.0)
Hemoglobin: 15.3 g/dL (ref 13.0–17.0)
MCH: 29.6 pg (ref 26.0–34.0)
MCHC: 32.3 g/dL (ref 30.0–36.0)
MCV: 91.5 fL (ref 80.0–100.0)
Platelets: 340 10*3/uL (ref 150–400)
RBC: 5.17 MIL/uL (ref 4.22–5.81)
RDW: 13.2 % (ref 11.5–15.5)
WBC: 17.5 10*3/uL — ABNORMAL HIGH (ref 4.0–10.5)
nRBC: 0 % (ref 0.0–0.2)

## 2021-02-09 LAB — POCT I-STAT, CHEM 8
BUN: 23 mg/dL (ref 8–23)
Calcium, Ion: 1.19 mmol/L (ref 1.15–1.40)
Chloride: 101 mmol/L (ref 98–111)
Creatinine, Ser: 1.2 mg/dL (ref 0.61–1.24)
Glucose, Bld: 112 mg/dL — ABNORMAL HIGH (ref 70–99)
HCT: 52 % (ref 39.0–52.0)
Hemoglobin: 17.7 g/dL — ABNORMAL HIGH (ref 13.0–17.0)
Potassium: 4.3 mmol/L (ref 3.5–5.1)
Sodium: 138 mmol/L (ref 135–145)
TCO2: 24 mmol/L (ref 22–32)

## 2021-02-09 LAB — SURGICAL PCR SCREEN
MRSA, PCR: NEGATIVE
Staphylococcus aureus: NEGATIVE

## 2021-02-09 SURGERY — INCISION AND DRAINAGE
Anesthesia: General | Site: Groin | Laterality: Left

## 2021-02-09 MED ORDER — METOPROLOL TARTRATE 5 MG/5ML IV SOLN: 2.0000 mg | INTRAVENOUS | Status: DC | PRN

## 2021-02-09 MED ORDER — GUAIFENESIN-DM 100-10 MG/5ML PO SYRP: 15.0000 mL | ORAL_SOLUTION | ORAL | Status: DC | PRN

## 2021-02-09 MED ORDER — SODIUM CHLORIDE 0.9 % IV SOLN: 250.0000 mL | INTRAVENOUS | Status: DC | PRN

## 2021-02-09 MED ORDER — OXYCODONE HCL 5 MG PO TABS
5.0000 mg | ORAL_TABLET | ORAL | Status: DC | PRN
  Administered 2021-02-10 – 2021-02-15 (×3): 10 mg via ORAL
  Filled 2021-02-09 (×3): qty 2

## 2021-02-09 MED ORDER — SODIUM CHLORIDE 0.9% FLUSH
3.0000 mL | Freq: Two times a day (BID) | INTRAVENOUS | Status: DC
  Administered 2021-02-09 – 2021-02-15 (×8): 3 mL via INTRAVENOUS

## 2021-02-09 MED ORDER — CHLORHEXIDINE GLUCONATE 4 % EX LIQD: 60.0000 mL | Freq: Once | CUTANEOUS | Status: DC

## 2021-02-09 MED ORDER — PHENOL 1.4 % MT LIQD: 2.0000 | OROMUCOSAL | Status: DC | PRN

## 2021-02-09 MED ORDER — METOPROLOL TARTRATE 12.5 MG HALF TABLET
12.5000 mg | ORAL_TABLET | Freq: Once | ORAL | Status: AC
  Administered 2021-02-09: 12.5 mg via ORAL

## 2021-02-09 MED ORDER — 0.9 % SODIUM CHLORIDE (POUR BTL) OPTIME
TOPICAL | Status: DC | PRN
  Administered 2021-02-09: 1000 mL

## 2021-02-09 MED ORDER — FENTANYL CITRATE (PF) 250 MCG/5ML IJ SOLN
INTRAMUSCULAR | Status: AC
  Filled 2021-02-09: qty 5

## 2021-02-09 MED ORDER — HYDROMORPHONE HCL 1 MG/ML IJ SOLN: 0.5000 mg | INTRAMUSCULAR | Status: DC | PRN

## 2021-02-09 MED ORDER — ORAL CARE MOUTH RINSE: 15.0000 mL | Freq: Once | OROMUCOSAL | Status: AC

## 2021-02-09 MED ORDER — DOCUSATE SODIUM 100 MG PO CAPS
100.0000 mg | ORAL_CAPSULE | Freq: Two times a day (BID) | ORAL | Status: DC
  Administered 2021-02-09 – 2021-02-14 (×10): 100 mg via ORAL
  Filled 2021-02-09 (×11): qty 1

## 2021-02-09 MED ORDER — PROPOFOL 10 MG/ML IV BOLUS
INTRAVENOUS | Status: DC | PRN
  Administered 2021-02-09: 150 mg via INTRAVENOUS

## 2021-02-09 MED ORDER — CHLORHEXIDINE GLUCONATE 0.12 % MT SOLN
15.0000 mL | Freq: Once | OROMUCOSAL | Status: AC
  Administered 2021-02-09: 15 mL via OROMUCOSAL
  Filled 2021-02-09: qty 15

## 2021-02-09 MED ORDER — FENTANYL CITRATE (PF) 100 MCG/2ML IJ SOLN
INTRAMUSCULAR | Status: DC | PRN
  Administered 2021-02-09: 25 ug via INTRAVENOUS
  Administered 2021-02-09: 100 ug via INTRAVENOUS
  Administered 2021-02-09: 25 ug via INTRAVENOUS

## 2021-02-09 MED ORDER — ALUM & MAG HYDROXIDE-SIMETH 200-200-20 MG/5ML PO SUSP: 15.0000 mL | ORAL | Status: DC | PRN

## 2021-02-09 MED ORDER — SENNOSIDES-DOCUSATE SODIUM 8.6-50 MG PO TABS: 1.0000 | ORAL_TABLET | Freq: Every evening | ORAL | Status: DC | PRN

## 2021-02-09 MED ORDER — HEPARIN SODIUM (PORCINE) 5000 UNIT/ML IJ SOLN
5000.0000 [IU] | Freq: Three times a day (TID) | INTRAMUSCULAR | Status: DC
  Administered 2021-02-10 – 2021-02-15 (×15): 5000 [IU] via SUBCUTANEOUS
  Filled 2021-02-09 (×16): qty 1

## 2021-02-09 MED ORDER — SODIUM CHLORIDE 0.9% FLUSH: 3.0000 mL | INTRAVENOUS | Status: DC | PRN

## 2021-02-09 MED ORDER — PHENYLEPHRINE 40 MCG/ML (10ML) SYRINGE FOR IV PUSH (FOR BLOOD PRESSURE SUPPORT)
PREFILLED_SYRINGE | INTRAVENOUS | Status: DC | PRN
  Administered 2021-02-09 (×2): 80 ug via INTRAVENOUS

## 2021-02-09 MED ORDER — LIDOCAINE 2% (20 MG/ML) 5 ML SYRINGE
INTRAMUSCULAR | Status: AC
  Filled 2021-02-09: qty 5

## 2021-02-09 MED ORDER — PROPOFOL 10 MG/ML IV BOLUS
INTRAVENOUS | Status: AC
  Filled 2021-02-09: qty 20

## 2021-02-09 MED ORDER — MIDAZOLAM HCL 2 MG/2ML IJ SOLN
INTRAMUSCULAR | Status: AC
  Filled 2021-02-09: qty 2

## 2021-02-09 MED ORDER — ONDANSETRON HCL 4 MG/2ML IJ SOLN: 4.0000 mg | Freq: Four times a day (QID) | INTRAMUSCULAR | Status: DC | PRN

## 2021-02-09 MED ORDER — PHENYLEPHRINE 40 MCG/ML (10ML) SYRINGE FOR IV PUSH (FOR BLOOD PRESSURE SUPPORT)
PREFILLED_SYRINGE | INTRAVENOUS | Status: AC
  Filled 2021-02-09: qty 10

## 2021-02-09 MED ORDER — SULFAMETHOXAZOLE-TRIMETHOPRIM 800-160 MG PO TABS
1.0000 | ORAL_TABLET | Freq: Two times a day (BID) | ORAL | Status: AC
  Administered 2021-02-09 (×2): 1 via ORAL
  Filled 2021-02-09 (×2): qty 1

## 2021-02-09 MED ORDER — LACTATED RINGERS IV SOLN: INTRAVENOUS | Status: DC

## 2021-02-09 MED ORDER — METOPROLOL TARTRATE 12.5 MG HALF TABLET
ORAL_TABLET | ORAL | Status: AC
  Filled 2021-02-09: qty 1

## 2021-02-09 MED ORDER — ACETAMINOPHEN 325 MG PO TABS: 650.0000 mg | ORAL_TABLET | Freq: Four times a day (QID) | ORAL | Status: DC | PRN

## 2021-02-09 MED ORDER — ACETAMINOPHEN 10 MG/ML IV SOLN
1000.0000 mg | Freq: Once | INTRAVENOUS | Status: DC | PRN
  Administered 2021-02-09: 1000 mg via INTRAVENOUS

## 2021-02-09 MED ORDER — POTASSIUM CHLORIDE CRYS ER 20 MEQ PO TBCR
20.0000 meq | EXTENDED_RELEASE_TABLET | Freq: Once | ORAL | Status: AC
  Administered 2021-02-09: 20 meq via ORAL
  Filled 2021-02-09: qty 1

## 2021-02-09 MED ORDER — DOCUSATE SODIUM 100 MG PO CAPS: 100.0000 mg | ORAL_CAPSULE | Freq: Every day | ORAL | Status: DC

## 2021-02-09 MED ORDER — FENTANYL CITRATE (PF) 100 MCG/2ML IJ SOLN
25.0000 ug | INTRAMUSCULAR | Status: DC | PRN
  Administered 2021-02-09: 25 ug via INTRAVENOUS

## 2021-02-09 MED ORDER — ONDANSETRON HCL 4 MG/2ML IJ SOLN
INTRAMUSCULAR | Status: AC
  Filled 2021-02-09: qty 2

## 2021-02-09 MED ORDER — FENTANYL CITRATE (PF) 100 MCG/2ML IJ SOLN
INTRAMUSCULAR | Status: AC
  Filled 2021-02-09: qty 2

## 2021-02-09 MED ORDER — LABETALOL HCL 5 MG/ML IV SOLN: 10.0000 mg | INTRAVENOUS | Status: DC | PRN

## 2021-02-09 MED ORDER — ONDANSETRON HCL 4 MG/2ML IJ SOLN: 4.0000 mg | Freq: Once | INTRAMUSCULAR | Status: DC | PRN

## 2021-02-09 MED ORDER — ATORVASTATIN CALCIUM 40 MG PO TABS
40.0000 mg | ORAL_TABLET | Freq: Every day | ORAL | Status: DC
  Administered 2021-02-09 – 2021-02-15 (×6): 40 mg via ORAL
  Filled 2021-02-09 (×7): qty 1

## 2021-02-09 MED ORDER — HYDRALAZINE HCL 20 MG/ML IJ SOLN
5.0000 mg | INTRAMUSCULAR | Status: DC | PRN
Start: 2021-02-09 — End: 2021-02-16

## 2021-02-09 MED ORDER — CEFAZOLIN SODIUM-DEXTROSE 2-4 GM/100ML-% IV SOLN
2.0000 g | INTRAVENOUS | Status: AC
  Administered 2021-02-09: 2 g via INTRAVENOUS
  Filled 2021-02-09: qty 100

## 2021-02-09 MED ORDER — VITAMIN D 25 MCG (1000 UNIT) PO TABS
1000.0000 [IU] | ORAL_TABLET | Freq: Every day | ORAL | Status: DC
  Administered 2021-02-09 – 2021-02-15 (×7): 1000 [IU] via ORAL
  Filled 2021-02-09 (×9): qty 1

## 2021-02-09 MED ORDER — METOPROLOL TARTRATE 12.5 MG HALF TABLET
12.5000 mg | ORAL_TABLET | Freq: Two times a day (BID) | ORAL | Status: DC
  Administered 2021-02-09 – 2021-02-15 (×13): 12.5 mg via ORAL
  Filled 2021-02-09 (×13): qty 1

## 2021-02-09 MED ORDER — ONDANSETRON HCL 4 MG/2ML IJ SOLN
INTRAMUSCULAR | Status: DC | PRN
Start: 2021-02-09 — End: 2021-02-09
  Administered 2021-02-09: 4 mg via INTRAVENOUS

## 2021-02-09 MED ORDER — PANTOPRAZOLE SODIUM 40 MG PO TBEC
40.0000 mg | DELAYED_RELEASE_TABLET | Freq: Every day | ORAL | Status: DC
  Administered 2021-02-09 – 2021-02-15 (×7): 40 mg via ORAL
  Filled 2021-02-09 (×7): qty 1

## 2021-02-09 MED ORDER — ASPIRIN EC 81 MG PO TBEC
81.0000 mg | DELAYED_RELEASE_TABLET | Freq: Every day | ORAL | Status: DC
  Administered 2021-02-10 – 2021-02-15 (×6): 81 mg via ORAL
  Filled 2021-02-09 (×7): qty 1

## 2021-02-09 MED ORDER — PHENOL 1.4 % MT LIQD: 1.0000 | OROMUCOSAL | Status: DC | PRN

## 2021-02-09 MED ORDER — PHENYLEPHRINE HCL-NACL 20-0.9 MG/250ML-% IV SOLN
INTRAVENOUS | Status: DC | PRN
  Administered 2021-02-09: 40 ug/min via INTRAVENOUS

## 2021-02-09 MED ORDER — LIDOCAINE 2% (20 MG/ML) 5 ML SYRINGE
INTRAMUSCULAR | Status: DC | PRN
  Administered 2021-02-09: 80 mg via INTRAVENOUS

## 2021-02-09 MED ORDER — PRO-STAT SUGAR FREE PO LIQD
30.0000 mL | Freq: Two times a day (BID) | ORAL | Status: DC
  Administered 2021-02-09: 30 mL via ORAL
  Filled 2021-02-09 (×3): qty 30

## 2021-02-09 MED ORDER — BISACODYL 5 MG PO TBEC: 5.0000 mg | DELAYED_RELEASE_TABLET | Freq: Every day | ORAL | Status: DC | PRN

## 2021-02-09 MED ORDER — GABAPENTIN 100 MG PO CAPS
100.0000 mg | ORAL_CAPSULE | Freq: Three times a day (TID) | ORAL | Status: DC
  Administered 2021-02-09 – 2021-02-15 (×19): 100 mg via ORAL
  Filled 2021-02-09 (×19): qty 1

## 2021-02-09 MED ORDER — SODIUM CHLORIDE 0.9 % IV SOLN: INTRAVENOUS | Status: DC

## 2021-02-09 MED ORDER — ACETAMINOPHEN 10 MG/ML IV SOLN
INTRAVENOUS | Status: AC
  Filled 2021-02-09: qty 100

## 2021-02-09 SURGICAL SUPPLY — 30 items
BAG COUNTER SPONGE SURGICOUNT (BAG) ×2 IMPLANT
CANISTER SUCT 3000ML PPV (MISCELLANEOUS) ×2 IMPLANT
CANISTER WOUND CARE 500ML ATS (WOUND CARE) ×2 IMPLANT
CLIP VESOCCLUDE SM WIDE 6/CT (CLIP) ×2 IMPLANT
COVER SURGICAL LIGHT HANDLE (MISCELLANEOUS) ×2 IMPLANT
DRSG VAC ATS SM SENSATRAC (GAUZE/BANDAGES/DRESSINGS) ×2 IMPLANT
ELECT REM PT RETURN 9FT ADLT (ELECTROSURGICAL) ×2
ELECTRODE REM PT RTRN 9FT ADLT (ELECTROSURGICAL) ×1 IMPLANT
GAUZE SPONGE 4X4 12PLY STRL (GAUZE/BANDAGES/DRESSINGS) ×2 IMPLANT
GAUZE XEROFORM 5X9 LF (GAUZE/BANDAGES/DRESSINGS) IMPLANT
GLOVE SURG ENC MOIS LTX SZ7.5 (GLOVE) ×2 IMPLANT
GOWN STRL REUS W/ TWL LRG LVL3 (GOWN DISPOSABLE) ×2 IMPLANT
GOWN STRL REUS W/ TWL XL LVL3 (GOWN DISPOSABLE) ×1 IMPLANT
GOWN STRL REUS W/TWL LRG LVL3 (GOWN DISPOSABLE) ×4
GOWN STRL REUS W/TWL XL LVL3 (GOWN DISPOSABLE) ×2
KIT BASIN OR (CUSTOM PROCEDURE TRAY) ×2 IMPLANT
KIT TURNOVER KIT B (KITS) ×2 IMPLANT
NS IRRIG 1000ML POUR BTL (IV SOLUTION) ×2 IMPLANT
PACK GENERAL/GYN (CUSTOM PROCEDURE TRAY) ×2 IMPLANT
PACK UNIVERSAL I (CUSTOM PROCEDURE TRAY) ×2 IMPLANT
PAD ARMBOARD 7.5X6 YLW CONV (MISCELLANEOUS) ×4 IMPLANT
SUT VIC AB 2-0 CT1 27 (SUTURE) ×8
SUT VIC AB 2-0 CT1 TAPERPNT 27 (SUTURE) ×4 IMPLANT
SUT VIC AB 3-0 SH 27 (SUTURE) ×6
SUT VIC AB 3-0 SH 27X BRD (SUTURE) ×3 IMPLANT
SUT VICRYL 4-0 PS2 18IN ABS (SUTURE) IMPLANT
SWAB COLLECTION DEVICE MRSA (MISCELLANEOUS) ×2 IMPLANT
SWAB CULTURE ESWAB REG 1ML (MISCELLANEOUS) ×2 IMPLANT
TOWEL GREEN STERILE (TOWEL DISPOSABLE) ×2 IMPLANT
WATER STERILE IRR 1000ML POUR (IV SOLUTION) ×2 IMPLANT

## 2021-02-09 NOTE — Anesthesia Procedure Notes (Addendum)
Procedure Name: LMA Insertion Date/Time: 02/09/2021 11:15 AM Performed by: Shary Decamp, CRNA Pre-anesthesia Checklist: Patient identified, Patient being monitored, Timeout performed, Emergency Drugs available and Suction available Patient Re-evaluated:Patient Re-evaluated prior to induction Oxygen Delivery Method: Circle System Utilized Preoxygenation: Pre-oxygenation with 100% oxygen Induction Type: IV induction Ventilation: Mask ventilation without difficulty LMA Size: 4.0 Number of attempts: 1 Placement Confirmation: positive ETCO2 and breath sounds checked- equal and bilateral Tube secured with: Tape Dental Injury: Teeth and Oropharynx as per pre-operative assessment

## 2021-02-09 NOTE — Op Note (Signed)
    Patient name: Stephen Bowen MRN: [id number] DOB: [birth year] Sex: male  02/09/2021 Pre-operative Diagnosis: left groin sinus tract Post-operative diagnosis:  Same Surgeon:  Apolinar Junes C. Randie Heinz, MD Assistant: Clinton Gallant, PA Procedure Performed: 1.  Excision of left groin sinus tract 2.  Application of left groin wound vac   Indications: 65 year old male with a previous history of left above-knee amputation.  He previously had a bypass graft that was excised and a vein patch angioplasty was placed on his left common femoral artery with a known occluded left superficial femoral artery.  He now has a draining sinus tract this has been evaluated in the office on 2 occasions he is indicated for excision of the sinus tract.  An assistant was necessary to facilitate exposure and expedite the case.  Findings: The sinus tract had significant amounts of Prolene we dissected down as far as we could to the common femoral artery there was no pulse I could not identify the artery given dense scar tissue.  0 Prolene was removed.  I close down some of the tissue defect with interrupted Vicryl suture and a wound VAC was placed.   Procedure:  The patient was identified in the holding area and taken to the operating was put supine on upper table general anesthesia was induced.  He was sterilely prepped draped in the left corner the usual fashion, antibiotics were ministered a timeout was called.  We began with elliptical incision around the sinus tract.  I immediately encountered significant Prolene suture.  This was removed.  We dissected down where the common femoral artery should be.  There was a venous injury medially and laterally these were repaired with interrupted 3-0 Vicryl suture given the patient did not appear to tolerate Prolene suture.  There was no femoral pulse.  We identified the inguinal ligament this was actually transected at completion I repaired it with running 2-0 Vicryl suture.  I could not  feel femoral or external iliac pulse.  I never did identify the common femoral artery given the dense scar tissue but elected that since the sinus tract was removed all Prolene was removed I thoroughly irrigated the wound and elected for wound VAC closure.  I did close down some of the distal tissue with interrupted 2-0 Vicryl suture.  Small wound VAC was then fashioned and placed in the groin.  Patient was awakened from anesthesia having tolerated procedure without any complication.  All counts were correct at completion.   EBL: 50cc  Taige Housman C. Randie Heinz, MD [facility] [place] Office: [phone] Pager: [phone]

## 2021-02-09 NOTE — Interval H&P Note (Signed)
History and Physical Interval Note:  02/09/2021 7:20 AM  Stephen Bowen  has presented today for surgery, with the diagnosis of Non healing left groin wound.  The various methods of treatment have been discussed with the patient and family. After consideration of risks, benefits and other options for treatment, the patient has consented to  Procedure(s): INCISION AND DRAINAGE OF LEFT GROIN WOUND (Left) as a surgical intervention.  The patient's history has been reviewed, patient examined, no change in status, stable for surgery.  I have reviewed the patient's chart and labs.  Questions were answered to the patient's satisfaction.     Lemar Livings

## 2021-02-09 NOTE — Anesthesia Preprocedure Evaluation (Signed)
Anesthesia Evaluation  Patient identified by MRN, date of birth, ID band Patient awake    Reviewed: Allergy & Precautions, NPO status , Patient's Chart, lab work & pertinent test results  Airway Mallampati: II  TM Distance: >3 FB Neck ROM: Full    Dental  (+) Poor Dentition, Missing   Pulmonary neg pulmonary ROS, former smoker,    Pulmonary exam normal        Cardiovascular hypertension, Pt. on medications and Pt. on home beta blockers + CAD, + Past MI and + Peripheral Vascular Disease   Rhythm:Regular Rate:Normal     Neuro/Psych Dementia Encephalopathy  CVA    GI/Hepatic Neg liver ROS, GERD  ,  Endo/Other  negative endocrine ROS  Renal/GU Renal disease  negative genitourinary   Musculoskeletal Non healing left groin wound   Abdominal (+)  Abdomen: soft. Bowel sounds: normal.  Peds  Hematology  (+) anemia ,   Anesthesia Other Findings   Reproductive/Obstetrics                             Anesthesia Physical Anesthesia Plan  ASA: 3  Anesthesia Plan: General   Post-op Pain Management:    Induction: Intravenous  PONV Risk Score and Plan: 2 and Ondansetron, Dexamethasone and Treatment may vary due to age or medical condition  Airway Management Planned: Mask and LMA  Additional Equipment: None  Intra-op Plan:   Post-operative Plan: Extubation in OR  Informed Consent: I have reviewed the patients History and Physical, chart, labs and discussed the procedure including the risks, benefits and alternatives for the proposed anesthesia with the patient or authorized representative who has indicated his/her understanding and acceptance.     Dental advisory given  Plan Discussed with: CRNA  Anesthesia Plan Comments: (ECHO 2019:  - Left ventricle: The cavity size was normal. There was mild focal  basal hypertrophy of the septum. Systolic function was vigorous.  The estimated  ejection fraction was in the range of 65% to 70%.  Wall motion was normal; there were no regional wall motion  abnormalities. Doppler parameters are consistent with abnormal  left ventricular relaxation (grade 1 diastolic dysfunction). )        Anesthesia Quick Evaluation

## 2021-02-09 NOTE — Transfer of Care (Signed)
Immediate Anesthesia Transfer of Care Note  Patient: Stephen Bowen  Procedure(s) Performed: INCISION AND DRAINAGE OF LEFT GROIN WOUND (Left: Groin) APPLICATION OF WOUND VAC (Left: Groin)  Patient Location: PACU  Anesthesia Type:General  Level of Consciousness: awake, patient cooperative and responds to stimulation  Airway & Oxygen Therapy: Patient Spontanous Breathing and Patient connected to face mask oxygen  Post-op Assessment: Report given to RN and Post -op Vital signs reviewed and stable  Post vital signs: Reviewed and stable  Last Vitals:  Vitals Value Taken Time  BP 141/103 02/09/21 1023  Temp    Pulse 92 02/09/21 1023  Resp 11 02/09/21 1023  SpO2 100 % 02/09/21 1023  Vitals shown include unvalidated device data.  Last Pain:  Vitals:   02/09/21 0807  TempSrc:   PainSc: 0-No pain         Complications: No notable events documented.

## 2021-02-09 NOTE — Progress Notes (Signed)
Pt transferred from PACU, VSS, chg completed and tele initiated.  Wound vac to L groin clean and intact.  Orders released.

## 2021-02-09 NOTE — Anesthesia Postprocedure Evaluation (Signed)
Anesthesia Post Note  Patient: Stephen Bowen  Procedure(s) Performed: INCISION AND DRAINAGE OF LEFT GROIN WOUND (Left: Groin) APPLICATION OF WOUND VAC (Left: Groin)     Patient location during evaluation: PACU Anesthesia Type: General Level of consciousness: awake and alert Pain management: pain level controlled Vital Signs Assessment: post-procedure vital signs reviewed and stable Respiratory status: spontaneous breathing, nonlabored ventilation, respiratory function stable and patient connected to nasal cannula oxygen Cardiovascular status: blood pressure returned to baseline and stable Postop Assessment: no apparent nausea or vomiting Anesthetic complications: no   No notable events documented.  Last Vitals:  Vitals:   02/09/21 1122 02/09/21 1152  BP: 119/84 106/77  Pulse: 84 86  Resp: (!) 9 15  Temp:    SpO2: 93% 93%    Last Pain:  Vitals:   02/09/21 1053  TempSrc:   PainSc: 0-No pain                 Earl Lites P Mikalyn Hermida

## 2021-02-09 NOTE — Progress Notes (Signed)
    Patient is not oriented to place or time he does not have anyone available for consent.  His nursing home was contacted and they state that he consents for himself.  He is at his current mental status baseline and I do not think he is okay to consent for himself but his groin has worsened drainage with maceration and his operation at this time is medically necessary.  Consent signed for medical emergency and we will proceed with left groin debridement as previously scheduled.  Jerauld Bostwick C. Randie Heinz, MD [facility] [place] Office: [phone] Pager: [phone]

## 2021-02-10 ENCOUNTER — Inpatient Hospital Stay (HOSPITAL_COMMUNITY): Payer: Medicare Other

## 2021-02-10 ENCOUNTER — Encounter (HOSPITAL_COMMUNITY): Payer: Self-pay | Admitting: Vascular Surgery

## 2021-02-10 MED ORDER — PROSOURCE PLUS PO LIQD
30.0000 mL | Freq: Two times a day (BID) | ORAL | Status: DC
  Administered 2021-02-10 – 2021-02-15 (×12): 30 mL via ORAL
  Filled 2021-02-10 (×12): qty 30

## 2021-02-10 NOTE — Progress Notes (Addendum)
  Progress Note    02/10/2021 8:11 AM 1 Day Post-Op  Subjective:  no complaints. Denies any shortness of breath, chest pain or palpitations   Vitals:   02/10/21 0338 02/10/21 0740  BP:  118/88  Pulse: (!) 102 (!) 121  Resp:  18  Temp:  98.3 F (36.8 C)  SpO2:  99%   Physical Exam: Cardiac: irregular, tachycardic Lungs:  non labored Incisions:  Left groin with wound VAC to suction, 150 cc bloody output Neurologic: alert but not oriented  CBC    Component Value Date/Time   WBC 17.5 (H) 02/09/2021 1352   RBC 5.17 02/09/2021 1352   HGB 15.3 02/09/2021 1352   HCT 47.3 02/09/2021 1352   HCT 22.1 (L) 11/10/2017 0443   PLT 340 02/09/2021 1352   MCV 91.5 02/09/2021 1352   MCH 29.6 02/09/2021 1352   MCHC 32.3 02/09/2021 1352   RDW 13.2 02/09/2021 1352   LYMPHSABS 1.0 01/09/2018 1019   MONOABS 1.3 (H) 01/09/2018 1019   EOSABS 0.0 01/09/2018 1019   BASOSABS 0.1 01/09/2018 1019    BMET    Component Value Date/Time   NA 134 (L) 02/09/2021 1352   K 5.4 (H) 02/09/2021 1352   CL 101 02/09/2021 1352   CO2 21 (L) 02/09/2021 1352   GLUCOSE 110 (H) 02/09/2021 1352   BUN 19 02/09/2021 1352   CREATININE 1.27 (H) 02/09/2021 1352   CREATININE 0.92 11/13/2013 1031   CALCIUM 8.9 02/09/2021 1352   GFRNONAA >60 02/09/2021 1352   GFRNONAA >89 11/13/2013 1031   GFRAA >60 01/14/2018 0436   GFRAA >89 11/13/2013 1031    INR    Component Value Date/Time   INR 1.07 01/09/2018 1149     Intake/Output Summary (Last 24 hours) at 02/10/2021 0811 Last data filed at 02/10/2021 0742 Gross per 24 hour  Intake 500 ml  Output 800 ml  Net -300 ml     Assessment/Plan:  65 y.o. male is s/p excision of left groin sinus and application of left groin wound VAC 1 Day Post-Op   Wound VAC to suction left groin with 150 cc bloody output Wound VAC change tomorrow Tachycardic asymptomatic Will order chest xray and EKG May need to consult cardiology. Hx of MI and CAD Prn control PRN  DVT  prophylaxis:  sq heparin   Graceann Congress, PA-C [facility] [phone] 02/10/2021 8:11 AM  I have independently interviewed and examined patient and agree with PA assessment and plan above.   Georgiann Neider C. Randie Heinz, MD [facility] [place] Office: [phone] Pager: [phone]

## 2021-02-10 NOTE — TOC Initial Note (Signed)
Transition of Care [facility]) - Initial/Assessment Note    Patient Details  Name: Stephen Bowen MRN: [id number] Date of Birth: 1955/10/25  Transition of Care [facility]) CM/SW Contact:    Eduard Roux, LCSW Phone Number: 02/10/2021, 11:34 AM  Clinical Narrative:                  CSW spoke with Admissions Coordinator Jomarie Longs at [facility]- he confirmed patient is from his facility. CSW advised patient's brother's number listed on demographics is no longer in service- he provide another number [phone]-however, VM, identifies someone other than Dorene Sorrow. CSW was unable to complete assessment.  CSW will continue to follow and assist with discharge planning.  Antony Blackbird, MSW, LCSW Clinical Social Worker    Expected Discharge Plan: Skilled Nursing Facility Barriers to Discharge: Continued Medical Work up   Patient Goals and CMS Choice        Expected Discharge Plan and Services Expected Discharge Plan: Skilled Nursing Facility In-house Referral: Clinical Social Work     Living arrangements for the past 2 months: Skilled Nursing Facility                                      Prior Living Arrangements/Services Living arrangements for the past 2 months: Skilled Nursing Facility Lives with:: Self, Facility Resident Patient language and need for interpreter reviewed:: No        Need for Family Participation in Patient Care: Yes (Comment) Care giver support system in place?: Yes (comment)   Criminal Activity/Legal Involvement Pertinent to Current Situation/Hospitalization: No - Comment as needed  Activities of Daily Living   ADL Screening (condition at time of admission) Patient's cognitive ability adequate to safely complete daily activities?: Yes Is the patient deaf or have difficulty hearing?: No Does the patient have difficulty seeing, even when wearing glasses/contacts?: No Does the patient have difficulty concentrating, remembering, or making  decisions?: Yes Patient able to express need for assistance with ADLs?: Yes Does the patient have difficulty dressing or bathing?: No Independently performs ADLs?: Yes (appropriate for developmental age) Does the patient have difficulty walking or climbing stairs?: Yes Weakness of Arms/Hands: None  Permission Sought/Granted                  Emotional Assessment       Orientation: : Oriented to Self Alcohol / Substance Use: Not Applicable Psych Involvement: No (comment)  Admission diagnosis:  PAD (peripheral artery disease) (HCC) [I73.9] Patient Active Problem List   Diagnosis Date Noted   Wound infection 01/10/2018   Hemorrhage from wound 01/09/2018   Acute renal failure (ARF) (HCC) 01/09/2018   Malnutrition of moderate degree 11/11/2017   Sepsis (HCC) 11/09/2017   Acute metabolic encephalopathy 11/09/2017   Anemia 11/09/2017   Tachycardia 11/09/2017   Non-healing wound of amputation stump (HCC) 10/08/2017   MRSA carrier 08/02/2016   Aortic atherosclerosis (HCC) 08/01/2016   Coronary atherosclerosis of native coronary artery 08/01/2016   Underweight 08/01/2016   AKI (acute kidney injury) (HCC) 07/30/2016   HLD (hyperlipidemia) 07/30/2016   GERD (gastroesophageal reflux disease) 07/30/2016   Acute encephalopathy 07/30/2016   Abdominal pain 07/30/2016   Pressure injury of skin 06/28/2016   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    Cortical blindness of right side of brain    Aphasia    Hypotension 06/21/2016   Stroke (HCC) 06/21/2016   Acute  MI, anterolateral wall, initial episode of care [facility])    Preoperative cardiovascular examination    PAD (peripheral artery disease) (HCC) 02/13/2016   Hyperkalemia 02/13/2016   Lactic acid increased 02/13/2016   Essential hypertension 02/13/2016   Seborrheic dermatitis 08/31/2015   Actinic keratosis of multiple sites of head and neck 08/31/2015   Tobacco use disorder 08/31/2015   Skin ulcer of scalp (HCC) 08/31/2015    Abnormal EKG 11/13/2013   Hypertensive urgency 11/13/2013   PCP:  Galvin Proffer, MD Pharmacy:  No Pharmacies Listed    Social Determinants of Health (SDOH) Interventions    Readmission Risk Interventions No flowsheet data found.

## 2021-02-11 MED ORDER — CEFAZOLIN SODIUM-DEXTROSE 1-4 GM/50ML-% IV SOLN
1.0000 g | Freq: Three times a day (TID) | INTRAVENOUS | Status: DC
  Administered 2021-02-11 – 2021-02-14 (×9): 1 g via INTRAVENOUS
  Filled 2021-02-11 (×10): qty 50

## 2021-02-11 NOTE — Progress Notes (Signed)
   VASCULAR SURGERY ASSESSMENT & PLAN:   POD 2 S/P: EXCISION OF LEFT GROIN SINUS TRACT AND PLACEMENT OF VAC: We will change his VAC today.  ID: His intraoperative culture had moderate white blood cells and rare gram-positive cocci.  We will start him on Ancef pending culture results.    SUBJECTIVE:   No complaints  PHYSICAL EXAM:   Vitals:   02/10/21 2000 02/11/21 0015 02/11/21 0600 02/11/21 0800  BP:  122/80 124/86 123/89  Pulse:  94  (!) 115  Resp:  18 18 18   Temp:  98.2 F (36.8 C) 98 F (36.7 C) 98.2 F (36.8 C)  TempSrc:  Oral Oral Oral  SpO2: 98% 100% 98% 95%  Weight:       His VAC is in place.  We will change this later today.  LABS:   Lab Results  Component Value Date   WBC 17.5 (H) 02/09/2021   HGB 15.3 02/09/2021   HCT 47.3 02/09/2021   MCV 91.5 02/09/2021   PLT 340 02/09/2021   Lab Results  Component Value Date   CREATININE 1.27 (H) 02/09/2021   Lab Results  Component Value Date   INR 1.07 01/09/2018   CBG (last 3)  No results for input(s): GLUCAP in the last 72 hours.  PROBLEM LIST:    Active Problems:   PAD (peripheral artery disease) (HCC)   CURRENT MEDS:    (feeding supplement) PROSource Plus  30 mL Oral BID BM   aspirin EC  81 mg Oral Daily   atorvastatin  40 mg Oral q1800   cholecalciferol  1,000 Units Oral Daily   docusate sodium  100 mg Oral BID   gabapentin  100 mg Oral TID   heparin  5,000 Units Subcutaneous Q8H   metoprolol tartrate  12.5 mg Oral BID   pantoprazole  40 mg Oral Daily   sodium chloride flush  3 mL Intravenous Q12H    01/11/2018 Office: [phone] 02/11/2021

## 2021-02-11 NOTE — Consult Note (Addendum)
WOC Nurse Consult Note: Reason for Consult: Left groin NPWT dressing changes to begin on Monday, 02/13/21 Wound type: Surgical Pressure Injury POA: No  Consult received and supplies ordered for Monday's NPWT dressing change (2 medium black foam dressing kits and 2 skin barrier rings).  WOC nursing team will follow, and will remain available to this patient, the nursing and medical teams.   Thanks, Ladona Mow, MSN, RN, GNP, [place]  Pager# [phone]

## 2021-02-11 NOTE — Progress Notes (Signed)
  Progress Note    02/11/2021 8:45 AM 2 Days Post-Op  Subjective:  no complaints  Left groin wound as below  Wound appears okay. No granulation tissue. Very irritated around wound bed. Bloody drainage in base of wound and at skin edges. No purulence seen on compression. Very tender. [place]alon skin prep used prior to replacing VAC dressings Will have wound care RN to start VAC changes on Monday   DVT prophylaxis:  sq heparin   Graceann Congress, PA-C [facility] [phone] 02/11/2021 8:45 AM

## 2021-02-12 NOTE — Progress Notes (Addendum)
  Progress Note    02/12/2021 8:25 AM 3 Days Post-Op  Subjective:  sitting up in bed eating breakfast. No complaints   Vitals:   02/12/21 0340 02/12/21 0759  BP: (!) 132/93 122/87  Pulse: (!) 101 (!) 119  Resp: 18 18  Temp: 98.2 F (36.8 C) 97.8 F (36.6 C)  SpO2: 93% 97%   Physical Exam: Cardiac:  tachy Lungs:  non labored Incisions:  left groin with wound vac to suction. 250 cc in canister Extremities:  both AKA well healed Neurologic: alert  CBC    Component Value Date/Time   WBC 17.5 (H) 02/09/2021 1352   RBC 5.17 02/09/2021 1352   HGB 15.3 02/09/2021 1352   HCT 47.3 02/09/2021 1352   HCT 22.1 (L) 11/10/2017 0443   PLT 340 02/09/2021 1352   MCV 91.5 02/09/2021 1352   MCH 29.6 02/09/2021 1352   MCHC 32.3 02/09/2021 1352   RDW 13.2 02/09/2021 1352   LYMPHSABS 1.0 01/09/2018 1019   MONOABS 1.3 (H) 01/09/2018 1019   EOSABS 0.0 01/09/2018 1019   BASOSABS 0.1 01/09/2018 1019    BMET    Component Value Date/Time   NA 134 (L) 02/09/2021 1352   K 5.4 (H) 02/09/2021 1352   CL 101 02/09/2021 1352   CO2 21 (L) 02/09/2021 1352   GLUCOSE 110 (H) 02/09/2021 1352   BUN 19 02/09/2021 1352   CREATININE 1.27 (H) 02/09/2021 1352   CREATININE 0.92 11/13/2013 1031   CALCIUM 8.9 02/09/2021 1352   GFRNONAA >60 02/09/2021 1352   GFRNONAA >89 11/13/2013 1031   GFRAA >60 01/14/2018 0436   GFRAA >89 11/13/2013 1031    INR    Component Value Date/Time   INR 1.07 01/09/2018 1149     Intake/Output Summary (Last 24 hours) at 02/12/2021 0825 Last data filed at 02/12/2021 0804 Gross per 24 hour  Intake 720 ml  Output 1750 ml  Net -1030 ml     Assessment/Plan:  65 y.o. male is s/p excision of left groin sinus and application of left groin wound VAC  3 Days Post-Op   Pain well controlled Wound VAC left groin with good seal. 50 cc overnight Remains in sinus tach. Asymptomatic Wound Care RN to change VAC starting tomorrow  Cultures grew WBC and rare GPC. Continue  Ancef. Pending final culture and sensitivity results  DVT prophylaxis:  sq heparin   Graceann Congress, PA-C [facility] [phone] 02/12/2021 8:25 AM  I have interviewed the patient and examined the patient. I agree with the findings by the PA.  His VAC has a good seal.  Continue Ancef pending culture results.  Cari Caraway, MD

## 2021-02-13 LAB — CBC
HCT: 42.4 % (ref 39.0–52.0)
Hemoglobin: 14 g/dL (ref 13.0–17.0)
MCH: 29.7 pg (ref 26.0–34.0)
MCHC: 33 g/dL (ref 30.0–36.0)
MCV: 90 fL (ref 80.0–100.0)
Platelets: 380 10*3/uL (ref 150–400)
RBC: 4.71 MIL/uL (ref 4.22–5.81)
RDW: 13.4 % (ref 11.5–15.5)
WBC: 10.4 10*3/uL (ref 4.0–10.5)
nRBC: 0 % (ref 0.0–0.2)

## 2021-02-13 LAB — AEROBIC/ANAEROBIC CULTURE W GRAM STAIN (SURGICAL/DEEP WOUND)

## 2021-02-13 NOTE — Progress Notes (Addendum)
  Progress Note    02/13/2021 7:19 AM 4 Days Post-Op  Subjective:  left groin tenderness   Vitals:   02/12/21 2345 02/13/21 0300  BP: 122/86 (!) 126/92  Pulse: (!) 102 97  Resp: 18 18  Temp: 97.6 F (36.4 C) 98 F (36.7 C)  SpO2: 96% 97%   Physical Exam: Cardiac:  tachycardic Lungs:  non labored Incisions:  left groin wound VAC with good seal, very erythematous around wound margins Extremities:  well perfused healed AKAs Neurologic: alert  CBC    Component Value Date/Time   WBC 17.5 (H) 02/09/2021 1352   RBC 5.17 02/09/2021 1352   HGB 15.3 02/09/2021 1352   HCT 47.3 02/09/2021 1352   HCT 22.1 (L) 11/10/2017 0443   PLT 340 02/09/2021 1352   MCV 91.5 02/09/2021 1352   MCH 29.6 02/09/2021 1352   MCHC 32.3 02/09/2021 1352   RDW 13.2 02/09/2021 1352   LYMPHSABS 1.0 01/09/2018 1019   MONOABS 1.3 (H) 01/09/2018 1019   EOSABS 0.0 01/09/2018 1019   BASOSABS 0.1 01/09/2018 1019    BMET    Component Value Date/Time   NA 134 (L) 02/09/2021 1352   K 5.4 (H) 02/09/2021 1352   CL 101 02/09/2021 1352   CO2 21 (L) 02/09/2021 1352   GLUCOSE 110 (H) 02/09/2021 1352   BUN 19 02/09/2021 1352   CREATININE 1.27 (H) 02/09/2021 1352   CREATININE 0.92 11/13/2013 1031   CALCIUM 8.9 02/09/2021 1352   GFRNONAA >60 02/09/2021 1352   GFRNONAA >89 11/13/2013 1031   GFRAA >60 01/14/2018 0436   GFRAA >89 11/13/2013 1031    INR    Component Value Date/Time   INR 1.07 01/09/2018 1149     Intake/Output Summary (Last 24 hours) at 02/13/2021 0719 Last data filed at 02/13/2021 0300 Gross per 24 hour  Intake 720 ml  Output 1250 ml  Net -530 ml     Assessment/Plan:  65 y.o. male is s/p excision of left groin sinus and application of left groin wound VAC     Pain well controlled Wound VAC left groin with good seal Remains in sinus tach. Asymptomatic Wound Care RN to change VAC starting today Cultures grew staph aureus, bacteroides fragilis. Continue Ancef. Pending sensitivity  results Dispo back to SNF  DVT prophylaxis:  sq heparin   Graceann Congress, PA-C [facility] [phone] 02/13/2021 7:19 AM  I have independently interviewed and examined patient and agree with PA assessment and plan above.  No susceptibilities yet back on cultures.  Continue Ancef.  Wound VAC change per wound care nurse.  Dispo will be back to SNF  [place] C. Randie Heinz, MD [facility] [place] Office: [phone] Pager: [phone]

## 2021-02-13 NOTE — Care Management Important Message (Signed)
Important Message  Patient Details  Name: Stephen Bowen MRN: [id number] Date of Birth: 02/29/1956   Medicare Important Message Given:  Yes     Renie Ora 02/13/2021, 11:29 AM

## 2021-02-13 NOTE — Consult Note (Signed)
WOC Nurse Consult Note: Reason for Consult:Left inguinal surgical incision with NPWT therapy in place. VAC change.  Wound type:surgical Pressure Injury POA: NA Measurement: 9 cm x 1 cm x 1 cm  Wound EKB:TCYEL red  bleeds with cleansing.  Drainage (amount, consistency, odor) moderate bleeding. No odor.  Periwound:intact   Dressing procedure/placement/frequency:1 piece black foam . Barrier ring to perimeter of wound due to trac pad and erythematous periwound skin .Covered with drape. Seal immediately achieved at 125 mmHg.  Change Mon/Wed/Fri Will follow  [place]le [place] MSN, RN, FNP-BC CWON Wound, Ostomy, Continence Nurse Pager [phone]

## 2021-02-14 LAB — RESP PANEL BY RT-PCR (FLU A&B, COVID) ARPGX2
Influenza A by PCR: NEGATIVE
Influenza B by PCR: NEGATIVE
SARS Coronavirus 2 by RT PCR: NEGATIVE

## 2021-02-14 MED ORDER — SULFAMETHOXAZOLE-TRIMETHOPRIM 800-160 MG PO TABS: 1.0000 | ORAL_TABLET | Freq: Two times a day (BID) | ORAL | Status: DC

## 2021-02-14 MED ORDER — AMOXICILLIN-POT CLAVULANATE 875-125 MG PO TABS
1.0000 | ORAL_TABLET | Freq: Two times a day (BID) | ORAL | Status: DC
  Administered 2021-02-14 – 2021-02-15 (×3): 1 via ORAL
  Filled 2021-02-14 (×3): qty 1

## 2021-02-14 NOTE — TOC Progression Note (Signed)
Transition of Care [facility]) - Progression Note    Patient Details  Name: Stephen Bowen MRN: [id number] Date of Birth: 07-02-1955  [facility]) CM/SW Contact  Eduard Roux, [place] Phone Number: 02/14/2021, 11:54 AM  Clinical Narrative:     CSW contacted [facility]- informed patient is expected to discharge tomorrow and he will need wound vac.   Covid test requested  Antony Blackbird, MSW, LCSW Clinical Social Worker    Expected Discharge Plan: Skilled Nursing Facility Barriers to Discharge: Continued Medical Work up  Expected Discharge Plan and Services Expected Discharge Plan: Skilled Nursing Facility In-house Referral: Clinical Social Work     Living arrangements for the past 2 months: Skilled Nursing Facility                                       Social Determinants of Health (SDOH) Interventions    Readmission Risk Interventions No flowsheet data found.

## 2021-02-14 NOTE — Progress Notes (Addendum)
  Progress Note    02/14/2021 8:03 AM 5 Days Post-Op  Subjective:  no complaints   Vitals:   02/14/21 0359 02/14/21 0740  BP: 113/84 (!) 151/96  Pulse: 90 98  Resp: 19 18  Temp: 98 F (36.7 C) 97.9 F (36.6 C)  SpO2: 95%    Physical Exam: Lungs:  non labored Incisions:  L groin with vac in place Neurologic: A&O  CBC    Component Value Date/Time   WBC 10.4 02/13/2021 0927   RBC 4.71 02/13/2021 0927   HGB 14.0 02/13/2021 0927   HCT 42.4 02/13/2021 0927   HCT 22.1 (L) 11/10/2017 0443   PLT 380 02/13/2021 0927   MCV 90.0 02/13/2021 0927   MCH 29.7 02/13/2021 0927   MCHC 33.0 02/13/2021 0927   RDW 13.4 02/13/2021 0927   LYMPHSABS 1.0 01/09/2018 1019   MONOABS 1.3 (H) 01/09/2018 1019   EOSABS 0.0 01/09/2018 1019   BASOSABS 0.1 01/09/2018 1019    BMET    Component Value Date/Time   NA 134 (L) 02/09/2021 1352   K 5.4 (H) 02/09/2021 1352   CL 101 02/09/2021 1352   CO2 21 (L) 02/09/2021 1352   GLUCOSE 110 (H) 02/09/2021 1352   BUN 19 02/09/2021 1352   CREATININE 1.27 (H) 02/09/2021 1352   CREATININE 0.92 11/13/2013 1031   CALCIUM 8.9 02/09/2021 1352   GFRNONAA >60 02/09/2021 1352   GFRNONAA >89 11/13/2013 1031   GFRAA >60 01/14/2018 0436   GFRAA >89 11/13/2013 1031    INR    Component Value Date/Time   INR 1.07 01/09/2018 1149     Intake/Output Summary (Last 24 hours) at 02/14/2021 0803 Last data filed at 02/14/2021 0658 Gross per 24 hour  Intake 480 ml  Output 430 ml  Net 50 ml     Assessment/Plan:  65 y.o. male is s/p L groin vac placement 5 Days Post-Op   -L groin vac without seal on my exam; medial groin fold patched and seal obtained; notify WOC RN if vac continues to be a problem -continue ancef pending sensitivity results  -d/c to SNF when placement arranged by [facility] team   Emilie Rutter, PA-C [facility] [phone] 02/14/2021 8:03 AM  I have independently interviewed and examined patient and agree with PA  assessment and plan above.  We will transition to p.o. Bactrim.  Pending SNF placement.  Cleto Claggett C. Randie Heinz, MD [facility] [place] Office: [phone] Pager: [phone]

## 2021-02-14 NOTE — TOC Progression Note (Signed)
Transition of Care [facility]) - Progression Note    Patient Details  Name: RAGAN REALE MRN: [id number] Date of Birth: Oct 27, 1955  [facility]) CM/SW Contact  Eduard Roux, [place] Phone Number: 02/14/2021, 5:31 PM  Clinical Narrative:     CSW visit with patient at bedside. CSW introduced self and explained role. Patient confirmed he was from  [facility] and as agreeable to returning once medically stable.  CSW inquired if anyone CSW needs to contact to inform regarding discharge- patient states no one.   CSW will continue to follow and assist with discharge planning.  Antony Blackbird, MSW, LCSW Clinical Social Worker    Expected Discharge Plan: Skilled Nursing Facility Barriers to Discharge: Continued Medical Work up  Expected Discharge Plan and Services Expected Discharge Plan: Skilled Nursing Facility In-house Referral: Clinical Social Work     Living arrangements for the past 2 months: Skilled Nursing Facility                                       Social Determinants of Health (SDOH) Interventions    Readmission Risk Interventions No flowsheet data found.

## 2021-02-14 NOTE — Progress Notes (Signed)
Wound vac track pad and canister changed. Wound vac obtained good seal w/ serosanguinous drainage.  Versie Starks, RN

## 2021-02-15 MED ORDER — AMOXICILLIN-POT CLAVULANATE 875-125 MG PO TABS: 1.0000 | ORAL_TABLET | Freq: Two times a day (BID) | ORAL | Status: AC

## 2021-02-15 MED ORDER — OXYCODONE-ACETAMINOPHEN 5-325 MG PO TABS: 1.0000 | ORAL_TABLET | Freq: Four times a day (QID) | ORAL | 0 refills | Status: DC | PRN

## 2021-02-15 MED ORDER — PROSOURCE PLUS PO LIQD: 30.0000 mL | Freq: Two times a day (BID) | ORAL | Status: DC

## 2021-02-15 NOTE — Discharge Summary (Signed)
Discharge Summary    Stephen Bowen 03-Feb-1956 65 y.o. male  [id number]  Admission Date: 02/09/2021  Discharge Date: 02/15/2021  Physician: Juventino Slovak*  Admission Diagnosis: PAD (peripheral artery disease) (HCC) [I73.9]   HPI:   This is a 65 y.o. male who presents for follow up wound check. We have been following a left groin wound for several hears now. He has history of bilateral above knee amputations and has had a left groin wound now for several years. This developed following excision of graft with vein patch angioplasty of his common femoral artery in 2019 By Dr. Randie Heinz. At his last visit with myself on 12/30/20 He was found to have hypertrophic granulation tissue. This was cauterized with silver nitrate.    He presents today for follow up. He reports no pain. He does say that there has been increased drainage and redness but he is unable to tell me how long this has been like that. He just says the nurses change the bandage daily. Patient does have limited ability to give history and he is from the nursing home and no one has accompanied him to his appointment  Hospital Course:  The patient was admitted to the hospital and taken to the operating room on 02/09/2021 and underwent: 1.  Excision of left groin sinus tract 2.  Application of left groin wound vac    Findings: The sinus tract had significant amounts of Prolene we dissected down as far as we could to the common femoral artery there was no pulse I could not identify the artery given dense scar tissue.  0 Prolene was removed.  I close down some of the tissue defect with interrupted Vicryl suture and a wound VAC was placed.  The pt tolerated the procedure well and was transported to the PACU in good condition.   On POD 2, his wound cx revealed moderate WBC and rare GPC and he was started on Ancef.  His wound vac was changed by WOC.  His wound cultures grew staph aureus and bacteroides fagilis.  He was placed  on Augmentin.   Post operatively, he did have sinus tachycardia that was asymptomatic.     CBC    Component Value Date/Time   WBC 10.4 02/13/2021 0927   RBC 4.71 02/13/2021 0927   HGB 14.0 02/13/2021 0927   HCT 42.4 02/13/2021 0927   HCT 22.1 (L) 11/10/2017 0443   PLT 380 02/13/2021 0927   MCV 90.0 02/13/2021 0927   MCH 29.7 02/13/2021 0927   MCHC 33.0 02/13/2021 0927   RDW 13.4 02/13/2021 0927   LYMPHSABS 1.0 01/09/2018 1019   MONOABS 1.3 (H) 01/09/2018 1019   EOSABS 0.0 01/09/2018 1019   BASOSABS 0.1 01/09/2018 1019    BMET    Component Value Date/Time   NA 134 (L) 02/09/2021 1352   K 5.4 (H) 02/09/2021 1352   CL 101 02/09/2021 1352   CO2 21 (L) 02/09/2021 1352   GLUCOSE 110 (H) 02/09/2021 1352   BUN 19 02/09/2021 1352   CREATININE 1.27 (H) 02/09/2021 1352   CREATININE 0.92 11/13/2013 1031   CALCIUM 8.9 02/09/2021 1352   GFRNONAA >60 02/09/2021 1352   GFRNONAA >89 11/13/2013 1031   GFRAA >60 01/14/2018 0436   GFRAA >89 11/13/2013 1031      Discharge Instructions     Call MD for:  redness, tenderness, or signs of infection (pain, swelling, bleeding, redness, odor or green/yellow discharge around incision site)   Complete by: As directed  Call MD for:  severe or increased pain, loss or decreased feeling  in affected limb(s)   Complete by: As directed    Call MD for:  temperature >100.5   Complete by: As directed    Discharge patient   Complete by: As directed    Discharge disposition: 03-Skilled Nursing Facility   Discharge patient date: 02/15/2021   Discharge wound care:   Complete by: As directed    Wound vac change M/W/F with next vac change on 02/17/2021   Lifting restrictions   Complete by: As directed    No heavy lifting for 3 weeks   Resume previous diet   Complete by: As directed        Discharge Diagnosis:  PAD (peripheral artery disease) (HCC) [I73.9]  Secondary Diagnosis: Patient Active Problem List   Diagnosis Date Noted   Wound  infection 01/10/2018   Hemorrhage from wound 01/09/2018   Acute renal failure (ARF) (HCC) 01/09/2018   Malnutrition of moderate degree 11/11/2017   Sepsis (HCC) 11/09/2017   Acute metabolic encephalopathy 11/09/2017   Anemia 11/09/2017   Tachycardia 11/09/2017   Non-healing wound of amputation stump (HCC) 10/08/2017   MRSA carrier 08/02/2016   Aortic atherosclerosis (HCC) 08/01/2016   Coronary atherosclerosis of native coronary artery 08/01/2016   Underweight 08/01/2016   AKI (acute kidney injury) (HCC) 07/30/2016   HLD (hyperlipidemia) 07/30/2016   GERD (gastroesophageal reflux disease) 07/30/2016   Acute encephalopathy 07/30/2016   Abdominal pain 07/30/2016   Pressure injury of skin 06/28/2016   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    Cortical blindness of right side of brain    Aphasia    Hypotension 06/21/2016   Stroke (HCC) 06/21/2016   Acute MI, anterolateral wall, initial episode of care [facility])    Preoperative cardiovascular examination    PAD (peripheral artery disease) (HCC) 02/13/2016   Hyperkalemia 02/13/2016   Lactic acid increased 02/13/2016   Essential hypertension 02/13/2016   Seborrheic dermatitis 08/31/2015   Actinic keratosis of multiple sites of head and neck 08/31/2015   Tobacco use disorder 08/31/2015   Skin ulcer of scalp (HCC) 08/31/2015   Abnormal EKG 11/13/2013   Hypertensive urgency 11/13/2013   Past Medical History:  Diagnosis Date   Coronary artery disease    Dementia (HCC)    Encephalopathy acute 07/2016   HTN (hypertension)    Hyperlipemia    Myocardial infarction (HCC)    Non-healing surgical wound    left AKA   Noncompliance    PAD (peripheral artery disease) (HCC)    a.  s/p left femoral to PT artery bypass with propaten on 02/16/16.     Allergies as of 02/15/2021   No Known Allergies      Medication List     STOP taking these medications    sulfamethoxazole-trimethoprim 800-160 MG tablet Commonly known as: BACTRIM DS        TAKE these medications    (feeding supplement) PROSource Plus liquid Take 30 mLs by mouth 2 (two) times daily between meals.   acetaminophen 325 MG tablet Commonly known as: TYLENOL Take 650 mg by mouth every 6 (six) hours as needed (pain/fever.).   amoxicillin-clavulanate 875-125 MG tablet Commonly known as: AUGMENTIN Take 1 tablet by mouth every 12 (twelve) hours for 7 days.   aspirin 81 MG EC tablet Take 1 tablet (81 mg total) by mouth daily.   atorvastatin 40 MG tablet Commonly known as: LIPITOR Take 1 tablet (40 mg total) by mouth daily at  6 PM.   Chloraseptic 1.4 % Liqd Generic drug: phenol Use as directed 2 sprays in the mouth or throat as needed for throat irritation / pain.   cholecalciferol 25 MCG (1000 UNIT) tablet Commonly known as: VITAMIN D Take 1,000 Units by mouth daily.   docusate sodium 100 MG capsule Commonly known as: COLACE Take 1 capsule (100 mg total) by mouth daily.   feeding supplement (PRO-STAT SUGAR FREE 64) Liqd Take 30 mLs by mouth in the morning and at bedtime.   gabapentin 100 MG capsule Commonly known as: NEURONTIN Take 100 mg by mouth 3 (three) times daily.   metoprolol tartrate 25 MG tablet Commonly known as: LOPRESSOR Take 0.5 tablets (12.5 mg total) by mouth 2 (two) times daily.   oxyCODONE-acetaminophen 5-325 MG tablet Commonly known as: Percocet Take 1 tablet by mouth every 6 (six) hours as needed.               Durable Medical Equipment  (From admission, onward)           Start     Ordered   02/09/21 1336  For home use only DME Negative pressure wound device  Once       Comments: 10 cm x 5 cm x 54 cm deep  Question Answer Comment  Frequency of dressing change 3 times per week   Length of need 3 Months   Dressing type Foam   Amount of suction 125 mm/Hg   Pressure application Continuous pressure   Supplies 10 canisters and 15 dressings per month for duration of therapy      02/09/21 1335               Discharge Care Instructions  (From admission, onward)           Start     Ordered   02/15/21 0000  Discharge wound care:       Comments: Wound vac change M/W/F with next vac change on 02/17/2021   02/15/21 1450            Prescriptions given:  Roxicet #20 No Refill  Augmentin bid x 7 days  Instructions:  Change wound vac M/W/F with next vac change on 02/17/2021  Disposition: SNF  Patient's condition: is Good  Follow up: 1. [facility] in 2-3 weeks   Doreatha Massed, PA-C [facility] [phone] 02/15/2021  2:57 PM

## 2021-02-15 NOTE — Progress Notes (Addendum)
  Progress Note    02/15/2021 7:39 AM 6 Days Post-Op  Subjective:  says they have changed the vac this morning.    Afebrile HR 90's-100's NSR 110's-130's systolic 98% RA  Vitals:   02/15/21 0411 02/15/21 0731  BP: 113/89 135/86  Pulse: 92 (!) 104  Resp:  20  Temp: (P) 97.8 F (36.6 C) 97.8 F (36.6 C)  SpO2: 94% 98%    Physical Exam: General  no distress Lungs:  non labored Incisions:  wound vac in place with good seal   CBC    Component Value Date/Time   WBC 10.4 02/13/2021 0927   RBC 4.71 02/13/2021 0927   HGB 14.0 02/13/2021 0927   HCT 42.4 02/13/2021 0927   HCT 22.1 (L) 11/10/2017 0443   PLT 380 02/13/2021 0927   MCV 90.0 02/13/2021 0927   MCH 29.7 02/13/2021 0927   MCHC 33.0 02/13/2021 0927   RDW 13.4 02/13/2021 0927   LYMPHSABS 1.0 01/09/2018 1019   MONOABS 1.3 (H) 01/09/2018 1019   EOSABS 0.0 01/09/2018 1019   BASOSABS 0.1 01/09/2018 1019    BMET    Component Value Date/Time   NA 134 (L) 02/09/2021 1352   K 5.4 (H) 02/09/2021 1352   CL 101 02/09/2021 1352   CO2 21 (L) 02/09/2021 1352   GLUCOSE 110 (H) 02/09/2021 1352   BUN 19 02/09/2021 1352   CREATININE 1.27 (H) 02/09/2021 1352   CREATININE 0.92 11/13/2013 1031   CALCIUM 8.9 02/09/2021 1352   GFRNONAA >60 02/09/2021 1352   GFRNONAA >89 11/13/2013 1031   GFRAA >60 01/14/2018 0436   GFRAA >89 11/13/2013 1031    INR    Component Value Date/Time   INR 1.07 01/09/2018 1149     Intake/Output Summary (Last 24 hours) at 02/15/2021 0739 Last data filed at 02/15/2021 0052 Gross per 24 hour  Intake 480 ml  Output 1800 ml  Net -1320 ml     Assessment/Plan:  65 y.o. male is s/p:  Excision of left groin sinus tract and placement of wound vac  6 Days Post-Op   -vac change today-vac with good seal -continue Augmentin -continue asa/statin -DVT prophylaxis:  sq heparin -for SNF when available   Doreatha Massed, PA-C [facility] [phone] 02/15/2021 7:39  AM   I have independently interviewed and examined patient and agree with PA assessment and plan above.   Denym Christenberry C. Randie Heinz, MD [facility] [place] Office: [phone] Pager: [phone]

## 2021-02-15 NOTE — Progress Notes (Signed)
Report given to [facility]. IV removed, pt belongings packed up. [facility] to transport pt to SNF.  Versie Starks, RN

## 2021-02-15 NOTE — TOC Transition Note (Signed)
Transition of Care [facility]) - CM/SW Discharge Note   Patient Details  Name: Stephen Bowen MRN: [id number] Date of Birth: July 24, 1955  Transition [facility]) CM/SW Contact:  Eduard Roux, LCSW Phone Number: 02/15/2021, 3:14 PM   Clinical Narrative:     Patient will Discharge to: [facility] Discharge Date: 02/15/2021 Family Notified: patient declined- states no once to call Transport VQ:[id number]  Per MD patient is ready for discharge. RN, patient, and facility notified of discharge. Discharge Summary sent to facility. RN given number for [phone]. Ambulance transport requested for patient.   Clinical Social Worker signing off.  Antony Blackbird, MSW, LCSW Clinical Social Worker     Final next level of care: Skilled Nursing Facility Barriers to Discharge: Barriers Resolved   Patient Goals and CMS Choice        Discharge Placement              Patient chooses bed at:  ([facility]) Patient to be transferred to facility by: [facility] Name of family member notified: paitent declined Patient and family notified of of transfer: 02/15/21  Discharge Plan and Services In-house Referral: Clinical Social Work                                   Social Determinants of Health (SDOH) Interventions     Readmission Risk Interventions No flowsheet data found.

## 2021-02-15 NOTE — Consult Note (Signed)
WOC Nurse wound follow up Patient receiving care in [facility] 4E10. Wound type: left groin surgical wound Measurement: 8.5 cm x 1.5 cm x 3 cm Wound bed: Blood clot material in the base of the wound. The dayshift nurse reports that the [facility] machine kept alarming during the night, and nightshift turned it off.  This action creates a situation where the wound drainage just sits there.  Which is what happened. The bloody drainage was running out from under the dressing. Drainage (amount, consistency, odor) dark, bloody Periwound: macerated, erythematous Dressing procedure/placement/frequency: All black foam and old barrier rings removed. Wound cleansed. One piece of black foam placed into the wound. Wound perimeter surrounded by 2 barrier rings to facilitate seal. Drape applied, immediate seal obtained.  Patient tolerated without medication for pain at the time of the dressing change.  Supplies in room for Friday.  Helmut Muster, RN, MSN, CWOCN, CNS-BC, pager [phone]

## 2021-03-08 ENCOUNTER — Encounter: Payer: Medicare Other | Admitting: Vascular Surgery

## 2021-03-15 ENCOUNTER — Encounter: Payer: Self-pay | Admitting: Vascular Surgery

## 2021-03-15 ENCOUNTER — Other Ambulatory Visit: Payer: Self-pay

## 2021-03-15 ENCOUNTER — Ambulatory Visit (INDEPENDENT_AMBULATORY_CARE_PROVIDER_SITE_OTHER): Payer: Medicare Other | Admitting: Vascular Surgery

## 2021-03-15 VITALS — BP 165/92 | HR 90 | Temp 98.3°F | Resp 20 | Ht 66.0 in | Wt 138.0 lb

## 2021-03-15 DIAGNOSIS — I739 Peripheral vascular disease, unspecified: Secondary | ICD-10-CM

## 2021-03-15 DIAGNOSIS — T8789 Other complications of amputation stump: Secondary | ICD-10-CM

## 2021-03-15 NOTE — Progress Notes (Signed)
     Subjective:     Patient ID: Stephen Bowen, male   DOB: [birth year], 65 y.o.   MRN: [id number]  HPI 65 year old male follows up after excision of sinus tract.  We removed significant Prolene tissue at the time I can actually not find his common femoral artery which was likely occluded.  We placed a wound VAC.  He now follows up for wound check.  He states that they remove the wound VAC earlier today.  He denies any complaints today.   Review of Systems Left groin wound    Objective:   Physical Exam Vitals:   03/15/21 1400  BP: (!) 165/92  Pulse: 90  Resp: 20  Temp: 98.3 F (36.8 C)  SpO2: 94%   Awake and alert On the respirations Left groin appears to have some heaped up granulation is not fully healed yet There is some macerated skin around the left groin wound no erythema    Assessment:     65 year old male status post excision of Prolene suture in his left groin with excision of sinus tract.  He now follows up for wound check    Plan:     I have instructed his nursing facility to continue the wound VAC if they are using it if not to wash the area daily with soap and water and place a dry dressing.  I will follow him up in 2 to 3 weeks for wound check.  Hopefully we can avoid further debridement.     Jullisa Grigoryan C. Randie Heinz, MD [facility] [place] Office: [phone] Pager: [phone]

## 2021-04-05 ENCOUNTER — Ambulatory Visit (INDEPENDENT_AMBULATORY_CARE_PROVIDER_SITE_OTHER): Payer: Medicare Other | Admitting: Physician Assistant

## 2021-04-05 VITALS — BP 143/90 | HR 102 | Temp 98.1°F | Resp 14

## 2021-04-05 DIAGNOSIS — T8189XD Other complications of procedures, not elsewhere classified, subsequent encounter: Secondary | ICD-10-CM

## 2021-04-05 NOTE — Progress Notes (Signed)
  POST OPERATIVE OFFICE NOTE    CC:  F/u for surgery  HPI:  This is a 65 y.o. male who is s/p excision of sinus tract left groin.  His past medical history is significant for vision of graft with vein patch angioplasty of his common femoral artery in 2019 by Dr. Randie Heinz.  He is status post bilateral above-knee amputations. No complaints today. He is compliant with aspirin and statin  No Known Allergies  Current Outpatient Medications  Medication Sig Dispense Refill   acetaminophen (TYLENOL) 325 MG tablet Take 650 mg by mouth every 6 (six) hours as needed (pain/fever.).     Amino Acids-Protein Hydrolys (FEEDING SUPPLEMENT, PRO-STAT SUGAR FREE 64,) LIQD Take 30 mLs by mouth in the morning and at bedtime.     aspirin EC 81 MG EC tablet Take 1 tablet (81 mg total) by mouth daily.     atorvastatin (LIPITOR) 40 MG tablet Take 1 tablet (40 mg total) by mouth daily at 6 PM. 30 tablet 0   cholecalciferol (VITAMIN D) 25 MCG (1000 UNIT) tablet Take 1,000 Units by mouth daily.     docusate sodium (COLACE) 100 MG capsule Take 1 capsule (100 mg total) by mouth daily. 10 capsule 0   gabapentin (NEURONTIN) 100 MG capsule Take 100 mg by mouth 3 (three) times daily.     metoprolol tartrate (LOPRESSOR) 25 MG tablet Take 0.5 tablets (12.5 mg total) by mouth 2 (two) times daily.     Nutritional Supplements (,FEEDING SUPPLEMENT, PROSOURCE PLUS) liquid Take 30 mLs by mouth 2 (two) times daily between meals.     oxyCODONE-acetaminophen (PERCOCET) 5-325 MG tablet Take 1 tablet by mouth every 6 (six) hours as needed. 20 tablet 0   phenol (CHLORASEPTIC) 1.4 % LIQD Use as directed 2 sprays in the mouth or throat as needed for throat irritation / pain.     No current facility-administered medications for this visit.     ROS:  See HPI  BP (!) 143/90 (BP Location: Right Arm, Patient Position: Sitting, Cuff Size: Normal)   Pulse (!) 102   Temp 98.1 F (36.7 C) (Temporal)   Resp 14   SpO2 96%   Physical  Exam: General appearance: Awake, alert in no apparent distress Respirations: Nonlabored Incisions: Left groin incision well-healed    Assessment/Plan:  This is a 65 y.o. male who is s/p: Vision left groin sinus tract on February 09, 2021.  His incision is well-healed.  Instructed nursing home to keep skin folds clean and dry.  Follow-up as needed.  Wendi Maya, PA-C [facility] [phone]  Clinic MD: Dr. Randie Heinz

## 2021-11-22 ENCOUNTER — Encounter: Payer: Self-pay | Admitting: Internal Medicine

## 2021-11-22 DIAGNOSIS — I517 Cardiomegaly: Secondary | ICD-10-CM | POA: Diagnosis not present

## 2024-03-02 ENCOUNTER — Inpatient Hospital Stay: Attending: Hematology and Oncology

## 2024-03-02 ENCOUNTER — Encounter: Payer: Self-pay | Admitting: Hematology and Oncology

## 2024-03-02 ENCOUNTER — Other Ambulatory Visit: Payer: Self-pay

## 2024-03-02 ENCOUNTER — Other Ambulatory Visit: Payer: Self-pay | Admitting: Hematology and Oncology

## 2024-03-02 ENCOUNTER — Inpatient Hospital Stay (HOSPITAL_BASED_OUTPATIENT_CLINIC_OR_DEPARTMENT_OTHER): Admitting: Hematology and Oncology

## 2024-03-02 VITALS — BP 130/79 | HR 69 | Temp 97.8°F | Resp 14 | Wt 112.6 lb

## 2024-03-02 DIAGNOSIS — R933 Abnormal findings on diagnostic imaging of other parts of digestive tract: Secondary | ICD-10-CM

## 2024-03-02 DIAGNOSIS — R1310 Dysphagia, unspecified: Secondary | ICD-10-CM | POA: Diagnosis not present

## 2024-03-02 DIAGNOSIS — F0390 Unspecified dementia without behavioral disturbance: Secondary | ICD-10-CM

## 2024-03-02 DIAGNOSIS — F17210 Nicotine dependence, cigarettes, uncomplicated: Secondary | ICD-10-CM | POA: Diagnosis not present

## 2024-03-02 DIAGNOSIS — J392 Other diseases of pharynx: Secondary | ICD-10-CM

## 2024-03-02 DIAGNOSIS — Z89612 Acquired absence of left leg above knee: Secondary | ICD-10-CM | POA: Insufficient documentation

## 2024-03-02 DIAGNOSIS — I2510 Atherosclerotic heart disease of native coronary artery without angina pectoris: Secondary | ICD-10-CM | POA: Diagnosis not present

## 2024-03-02 DIAGNOSIS — Z89611 Acquired absence of right leg above knee: Secondary | ICD-10-CM | POA: Insufficient documentation

## 2024-03-02 LAB — CBC WITH DIFFERENTIAL (CANCER CENTER ONLY)
Abs Immature Granulocytes: 0.04 K/uL (ref 0.00–0.07)
Basophils Absolute: 0 K/uL (ref 0.0–0.1)
Basophils Relative: 0 %
Eosinophils Absolute: 0.9 K/uL — ABNORMAL HIGH (ref 0.0–0.5)
Eosinophils Relative: 10 %
HCT: 44.8 % (ref 39.0–52.0)
Hemoglobin: 14.6 g/dL (ref 13.0–17.0)
Immature Granulocytes: 0 %
Lymphocytes Relative: 15 %
Lymphs Abs: 1.4 K/uL (ref 0.7–4.0)
MCH: 30.5 pg (ref 26.0–34.0)
MCHC: 32.6 g/dL (ref 30.0–36.0)
MCV: 93.7 fL (ref 80.0–100.0)
Monocytes Absolute: 0.6 K/uL (ref 0.1–1.0)
Monocytes Relative: 7 %
Neutro Abs: 6 K/uL (ref 1.7–7.7)
Neutrophils Relative %: 68 %
Platelet Count: 438 K/uL — ABNORMAL HIGH (ref 150–400)
RBC: 4.78 MIL/uL (ref 4.22–5.81)
RDW: 13.3 % (ref 11.5–15.5)
WBC Count: 9 K/uL (ref 4.0–10.5)
nRBC: 0 % (ref 0.0–0.2)

## 2024-03-02 LAB — CMP (CANCER CENTER ONLY)
ALT: 8 U/L (ref 0–44)
AST: <10 U/L — ABNORMAL LOW (ref 15–41)
Albumin: 4.4 g/dL (ref 3.5–5.0)
Alkaline Phosphatase: 87 U/L (ref 38–126)
Anion gap: 17 — ABNORMAL HIGH (ref 5–15)
BUN: 14 mg/dL (ref 8–23)
CO2: 22 mmol/L (ref 22–32)
Calcium: 10.3 mg/dL (ref 8.9–10.3)
Chloride: 99 mmol/L (ref 98–111)
Creatinine: 1.07 mg/dL (ref 0.61–1.24)
GFR, Estimated: >60 mL/min (ref >60)
Glucose, Bld: 80 mg/dL (ref 70–99)
Potassium: 4 mmol/L (ref 3.5–5.1)
Sodium: 138 mmol/L (ref 135–145)
Total Bilirubin: 0.5 mg/dL (ref 0.0–1.2)
Total Protein: 8 g/dL (ref 6.5–8.1)

## 2024-03-02 NOTE — Progress Notes (Addendum)
 [facility] [address] [place],  [place]  [address] [phone]  Clinic Day:  03/02/2024   Referring physician: Pia Kerney SQUIBB, MD  Patient Care Team: Patient Care Team: Trinidad Glisson, MD as PCP - General (Family Medicine) Rehab, [facility] And (Skilled Nursing Facility)   REASON FOR CONSULTATION:  Throat mass  HISTORY OF PRESENT ILLNESS:   Stephen Bowen is a 68 y.o. male with a history of mass in the throat who is referred in consultation by Glisson Trinidad, MD for assessment and management. He states that within the last 7 days he noted difficulty swallowing and coughing up food that he does swallow. 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 Recommend direct visualization and likely tissue sampling. CT chest - no acute cardiopulmonary findings; no pulmonary embolism. He denies fever, chills, nausea or vomiting. He denies shortness of breath or chest pain. He denies issue with bowel or bladder. He does reside in a skilled nursing facility. He has had bilateral AKA. He is an everyday smoker of approximately 6 cigarettes daily. He denies use of alcohol or other drugs.    REVIEW OF SYSTEMS:   Review of Systems  Constitutional:  Positive for appetite change.  HENT:   Positive for lump/mass and trouble swallowing.   Eyes: Negative.   Respiratory:  Positive for cough and hemoptysis.   Cardiovascular: Negative.   Gastrointestinal: Negative.   Endocrine: Negative.   Genitourinary: Negative.    Musculoskeletal: Negative.   Skin: Negative.   Neurological: Negative.   Hematological: Negative.   Psychiatric/Behavioral: Negative.       VITALS:   Blood pressure 130/79, pulse 69, temperature 97.8 F (36.6 C), temperature source Oral, resp. rate 14, weight 112 lb 9.6 oz (51.1 kg), SpO2 97%.  Wt Readings from Last 3 Encounters:  03/02/24 112 lb 9.6 oz (51.1 kg)  03/15/21 138  lb (62.6 kg)  02/09/21 138 lb 8 oz (62.8 kg)    Body mass index is 18.17 kg/m.  Performance status (ECOG): 2 - Symptomatic, <50% confined to bed  PHYSICAL EXAM:   Physical Exam Constitutional:      Appearance: Normal appearance. He is normal weight.  HENT:     Head: Normocephalic and atraumatic.     Mouth/Throat:     Mouth: Mucous membranes are moist.  Cardiovascular:     Rate and Rhythm: Normal rate and regular rhythm.     Pulses: Normal pulses.     Heart sounds: Normal heart sounds.  Pulmonary:     Effort: Pulmonary effort is normal.     Breath sounds: Normal breath sounds.  Abdominal:     Palpations: Abdomen is soft.  Musculoskeletal:     Cervical back: Normal range of motion and neck supple.     Comments: Bilateral AKA  Skin:    General: Skin is warm and dry.  Neurological:     General: No focal deficit present.     Mental Status: He is alert and oriented to person, place, and time. Mental status is at baseline.  Psychiatric:        Mood and Affect: Mood normal.        Behavior: Behavior normal.        Thought Content: Thought content normal.        Judgment: Judgment normal.      LABS:      Latest Ref Rng & Units 03/02/2024  8:03 AM 02/13/2021    9:27 AM 02/09/2021    1:52 PM  CBC  WBC 4.0 - 10.5 K/uL 9.0  10.4  17.5   Hemoglobin 13.0 - 17.0 g/dL 85.3  85.9  84.6   Hematocrit 39.0 - 52.0 % 44.8  42.4  47.3   Platelets 150 - 400 K/uL 438  380  340       Latest Ref Rng & Units 03/02/2024    8:03 AM 02/09/2021    1:52 PM 02/09/2021    8:15 AM  CMP  Glucose 70 - 99 mg/dL 80  889  887   BUN 8 - 23 mg/dL 14  19  23    Creatinine 0.61 - 1.24 mg/dL 8.92  8.72  8.79   Sodium 135 - 145 mmol/L 138  134  138   Potassium 3.5 - 5.1 mmol/L 4.0  5.4  4.3   Chloride 98 - 111 mmol/L 99  101  101   CO2 22 - 32 mmol/L 22  21    Calcium  8.9 - 10.3 mg/dL 89.6  8.9    Total Protein 6.5 - 8.1 g/dL 8.0     Total Bilirubin 0.0 - 1.2 mg/dL 0.5     Alkaline Phos 38 - 126 U/L  87     AST 15 - 41 U/L <10     ALT 0 - 44 U/L 8        No results found for: CEA1, CEA / No results found for: CEA1, CEA No results found for: PSA1 No results found for: CAN199 No results found for: RJW874  Lab Results  Component Value Date   TOTALPROTELP 5.7 (L) 11/10/2017   Lab Results  Component Value Date   TIBC 132 (L) 11/10/2017   FERRITIN 252 11/10/2017   IRONPCTSAT 18 11/10/2017   No results found for: LDH  STUDIES:   No results found.    HISTORY:   Past Medical History:  Diagnosis Date   Coronary artery disease    Dementia (HCC)    Encephalopathy acute 07/2016   HTN (hypertension)    Hyperlipemia    Myocardial infarction [facility])    Non-healing surgical wound    left AKA   Noncompliance    PAD (peripheral artery disease) (HCC)    a.  s/p left femoral to PT artery bypass with propaten on 02/16/16.    Past Surgical History:  Procedure Laterality Date   ABDOMINAL AORTOGRAM W/LOWER EXTREMITY N/A 08/02/2016   Procedure: Abdominal Aortogram w/Lower Extremity;  Surgeon: Penne Lonni Colorado, MD;  Location: [facility] INVASIVE CV LAB;  Service: Cardiovascular;  Laterality: N/A;   ABOVE KNEE LEG AMPUTATION Right 12/04/2016   AMPUTATION Left 06/25/2016   Procedure: AMPUTATION ABOVE KNEE;  Surgeon: Gaile LELON New, MD;  Location: [facility] OR;  Service: Vascular;  Laterality: Left;   AMPUTATION Right 12/04/2016   Procedure: RIGHT AMPUTATION ABOVE KNEE;  Surgeon: [place] Penne Lonni, MD;  Location: [facility] OR;  Service: Vascular;  Laterality: Right;   AMPUTATION Left 10/08/2017   Procedure: LEFT ABOVE KNEE AMPUTATION REVISION;  Surgeon: [place] Penne Lonni, MD;  Location: [facility] OR;  Service: Vascular;  Laterality: Left;   APPLICATION OF WOUND VAC Left 02/09/2021   Procedure: APPLICATION OF WOUND VAC;  Surgeon: [place] Penne Lonni, MD;  Location: [facility] OR;  Service: Vascular;  Laterality: Left;   BYPASS GRAFT POPLITEAL TO TIBIAL Left 06/21/2016   Procedure:  BYPASS GRAFT POPLITEAL TO TIBIAL USING GORE PROPATEN GRAFT;  Surgeon: Penne Lonni Colorado, MD;  Location: [facility] OR;  Service: Vascular;  Laterality: Left;   EMBOLECTOMY Left 06/21/2016   Procedure: EMBOLECTOMY left lower extremity.;  Surgeon: Penne Lonni [place], MD;  Location: [facility] OR;  Service: Vascular;  Laterality: Left;   ENDARTERECTOMY FEMORAL Left 02/16/2016   Procedure: LEFT FEMORAL ENDARTERECTOMY;  Surgeon: Penne Lonni [place], MD;  Location: [facility] OR;  Service: Vascular;  Laterality: Left;   FEMORAL BYPASS  02/16/2016   LEFT FEMORAL-POSTERIOR TIBIAL ARTERY BYPASS  WITH PROPATEN 6 MM X 80 CM VASCULAR RING GRAFT (Left)   FEMORAL-TIBIAL BYPASS GRAFT Left 02/16/2016   Procedure: LEFT FEMORAL-POSTERIOR TIBIAL ARTERY BYPASS  WITH PROPATEN 6 MM X 80 CM VASCULAR RING GRAFT;  Surgeon: Penne Lonni [place], MD;  Location: [facility] OR;  Service: Vascular;  Laterality: Left;   FRACTURE SURGERY     I & D EXTREMITY Left 09/03/2016   Procedure: IRRIGATION AND DEBRIDEMENT EXTREMITY - LEFT AKA;  Surgeon: Penne Lonni Colorado, MD;  Location: [facility] OR;  Service: Vascular;  Laterality: Left;   I & D EXTREMITY Left 01/09/2018   Procedure: IRRIGATION AND DEBRIDEMENT GROIN;  Surgeon: [place] Penne Lonni, MD;  Location: [facility] OR;  Service: Vascular;  Laterality: Left;   INCISION AND DRAINAGE Left 02/09/2021   Procedure: INCISION AND DRAINAGE OF LEFT GROIN WOUND;  Surgeon: [place] Penne Lonni, MD;  Location: [facility] OR;  Service: Vascular;  Laterality: Left;   PATCH ANGIOPLASTY Left 02/16/2016   Procedure: PATCH ANGIOPLASTY WITH GEORGE BIOLOGIC PATCH 1 CM X 6 CM;  Surgeon: Penne Lonni Colorado, MD;  Location: [facility] OR;  Service: Vascular;  Laterality: Left;   PATCH ANGIOPLASTY Left 01/09/2018   Procedure: PATCH ANGIOPLASTY OF LEFT COMMON FEMORAL AND PROFUNDA  USING SAPHENOUS VEIN;  Surgeon: [place] Penne Lonni, MD;  Location: [facility] OR;  Service: Vascular;  Laterality: Left;   PERIPHERAL VASCULAR  BALLOON ANGIOPLASTY Right 08/02/2016   Procedure: Peripheral Vascular Balloon Angioplasty;  Surgeon: Penne Lonni Colorado, MD;  Location: [facility] INVASIVE CV LAB;  Service: Cardiovascular;  Laterality: Right;  peroneal artery   PERIPHERAL VASCULAR CATHETERIZATION N/A 07/07/2015   Procedure: Abdominal Aortogram w/Lower Extremity;  Surgeon: Redell LITTIE Door, MD;  Location: [facility] INVASIVE CV LAB;  Service: Cardiovascular;  Laterality: N/A;   PERIPHERAL VASCULAR CATHETERIZATION N/A 02/15/2016   Procedure: Abdominal Aortogram w/Lower Extremity;  Surgeon: Penne Lonni Colorado, MD;  Location: [facility] INVASIVE CV LAB;  Service: Cardiovascular;  Laterality: N/A;   PERIPHERAL VASCULAR CATHETERIZATION Bilateral 06/20/2016   Procedure: Lower Extremity Angiography;  Surgeon: Penne Lonni Colorado, MD;  Location: [facility] INVASIVE CV LAB;  Service: Cardiovascular;  Laterality: Bilateral;  limited runoff on rt leg thrombolysis lt leg bypass graft   PERIPHERAL VASCULAR INTERVENTION Right 08/02/2016   Procedure: Peripheral Vascular Intervention;  Surgeon: Penne Lonni Colorado, MD;  Location: [facility] INVASIVE CV LAB;  Service: Cardiovascular;  Laterality: Right;  Femoral , popliteal   TIBIA FRACTURE SURGERY Left 2000   per patient, he has rod inside his left leg.   VEIN HARVEST Left 01/09/2018   Procedure: LEFT SAPHENOUS VEIN HARVEST;  Surgeon: [place] Penne Lonni, MD;  Location: [facility] OR;  Service: Vascular;  Laterality: Left;    Family History  Problem Relation Age of Onset   Cancer Mother        deceased in 81s    Heart attack Mother        diseased   Heart disease Father    Stroke Sister        age 7s   Heart disease Brother  pacemaker in his 65s    Social History:  reports that he has been smoking cigarettes. He has a 120 pack-year smoking history. He has never used smokeless tobacco. He reports that he does not currently use alcohol after a past usage of about 14.0 standard drinks of alcohol per week. He  reports that he does not use drugs.The patient is accompanied by caretaker today.  Allergies: No Known Allergies  Current Medications: Current Outpatient Medications  Medication Sig Dispense Refill   aspirin  EC 81 MG EC tablet Take 1 tablet (81 mg total) by mouth daily.     atorvastatin  (LIPITOR ) 40 MG tablet Take 1 tablet (40 mg total) by mouth daily at 6 PM. 30 tablet 0   docusate sodium  (COLACE) 100 MG capsule Take 1 capsule (100 mg total) by mouth daily. 10 capsule 0   fenofibrate micronized (LOFIBRA) 67 MG capsule Take 67 mg by mouth daily.     gabapentin  (NEURONTIN ) 100 MG capsule Take 100 mg by mouth 3 (three) times daily.     levETIRAcetam (KEPPRA) 500 MG tablet Take 500 mg by mouth 2 (two) times daily.     lisinopril  (ZESTRIL ) 5 MG tablet Take 5 mg by mouth daily.     metFORMIN (GLUCOPHAGE) 500 MG tablet Take 500 mg by mouth daily.     metoprolol  succinate (TOPROL -XL) 25 MG 24 hr tablet Take 25 mg by mouth daily.     metoprolol  succinate (TOPROL -XL) 50 MG 24 hr tablet Take 50 mg by mouth daily.     phenol (CHLORASEPTIC) 1.4 % LIQD Use as directed 2 sprays in the mouth or throat as needed for throat irritation / pain.     No current facility-administered medications for this visit.     ASSESSMENT & PLAN:   Assessment:  Stephen Bowen is a 68 y.o. male with history of increasing difficulty swallowing and coughing up his food with blood when he is able to swallow. 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 Recommend direct visualization and likely tissue sampling. CT chest - no acute cardiopulmonary findings; no pulmonary embolism. He has multiple co-morbidities including CAD, dementia and h/o bilateral AKA. He resides in a skilled nursing facility. He is a poor historian, but understands there is something in his throat keeping him from swallowing. I discussed with  him obtaining a PET scan for further evaluation and possible tissue sampling. He is agreeable to this. We will start with a PET scan and order tissue sampling from then. Once pathology is obtained, we will see him back in clinic for discussion of results and possible treatment planning.    Plan: 1.  PET scan and biopsy. Will follow up once pathology obtained for further discussion and treatment planning.   I discussed the assessment and treatment plan with the patient.  The patient was provided an opportunity to ask questions and all were answered.  The patient agreed with the plan and demonstrated an understanding of the instructions.    Thank you for the referral    45 minutes was spent in patient care.  This included time spent preparing to see the patient (e.g., review of tests), obtaining and/or reviewing separately obtained history, counseling and educating the patient/family/caregiver, ordering medications, tests, or procedures; documenting clinical information in the electronic or other health record, independently interpreting results and communicating results to the patient/family/caregiver as well as coordination  of care.      Eleanor DELENA Bach, NP   Family Nurse Practitioner - Board Certified [facility] [place] [phone]

## 2024-03-09 ENCOUNTER — Ambulatory Visit (HOSPITAL_BASED_OUTPATIENT_CLINIC_OR_DEPARTMENT_OTHER)
Admission: RE | Admit: 2024-03-09 | Discharge: 2024-03-09 | Disposition: A | Discharge status: Home / Self Care | Source: Ambulatory Visit | Attending: Hematology and Oncology | Admitting: Hematology and Oncology

## 2024-03-09 ENCOUNTER — Other Ambulatory Visit: Payer: Self-pay | Admitting: Hematology and Oncology

## 2024-03-09 DIAGNOSIS — J392 Other diseases of pharynx: Secondary | ICD-10-CM

## 2024-03-09 DIAGNOSIS — R933 Abnormal findings on diagnostic imaging of other parts of digestive tract: Secondary | ICD-10-CM | POA: Diagnosis not present

## 2024-03-09 MED ORDER — FLUDEOXYGLUCOSE F - 18 (FDG) INJECTION
6.5000 | Freq: Once | INTRAVENOUS | Status: AC | PRN
  Administered 2024-03-09: 6.21 via INTRAVENOUS

## 2024-03-10 ENCOUNTER — Ambulatory Visit (INDEPENDENT_AMBULATORY_CARE_PROVIDER_SITE_OTHER): Admitting: Otolaryngology

## 2024-03-10 ENCOUNTER — Telehealth (INDEPENDENT_AMBULATORY_CARE_PROVIDER_SITE_OTHER): Payer: Self-pay | Admitting: Otolaryngology

## 2024-03-10 ENCOUNTER — Telehealth (HOSPITAL_COMMUNITY): Payer: Self-pay | Admitting: *Deleted

## 2024-03-10 ENCOUNTER — Encounter (INDEPENDENT_AMBULATORY_CARE_PROVIDER_SITE_OTHER): Payer: Self-pay | Admitting: Otolaryngology

## 2024-03-10 VITALS — BP 101/72 | HR 93 | Temp 98.0°F

## 2024-03-10 DIAGNOSIS — R1310 Dysphagia, unspecified: Secondary | ICD-10-CM | POA: Diagnosis not present

## 2024-03-10 DIAGNOSIS — J392 Other diseases of pharynx: Secondary | ICD-10-CM | POA: Diagnosis not present

## 2024-03-10 NOTE — H&P (View-Only) (Signed)
 ENT CONSULT:  Reason for Consult: concern for a hypopharyngeal mass on PET/CT   HPI: Discussed the use of AI scribe software for clinical note transcription with the patient, who gave verbal consent to proceed.  History of Present Illness Stephen Bowen is a 68 year old male who presents with difficulty swallowing and PET/CT avid pharyngeal mass.  Stephen Bowen has been experiencing difficulty swallowing for approximately ten days, currently unable to eat solid foods and sustaining himself on liquid shakes. Prior to this, Stephen Bowen was able to eat normally.   A recent CT scan showed no abnormal lymph nodes but demonstrated thickening along the pharyngeal wall, and it was FDG avid on PET/CT. Stephen Bowen is a current smoker with 120-pack year hx. 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    Records Reviewed:  [facility] Center Note 03/02/24 Heme/Onc Eleanor Bach NP Stephen Bowen is a 68 y.o. male with a history of mass in the throat who is referred in consultation by Eric Georgi, MD for assessment and management. Stephen Bowen states that within the last 7 days Stephen Bowen noted difficulty swallowing and coughing up food that Stephen Bowen does swallow. 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 Recommend direct visualization and likely tissue sampling. CT chest - no acute cardiopulmonary findings; no pulmonary embolism. Stephen Bowen denies fever, chills, nausea or vomiting. Stephen Bowen denies shortness of breath or chest pain. Stephen Bowen denies issue with bowel or bladder. Stephen Bowen does reside in a skilled nursing facility. 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 Stephen Bowen is an everyday smoker of approximately 6 cigarettes daily. Stephen Bowen denies use of alcohol or other drugs.       Past Medical History:  Diagnosis Date   Coronary artery disease    Dementia (HCC)    Encephalopathy acute 07/2016   HTN (hypertension)    Hyperlipemia    Myocardial infarction [facility])    Non-healing surgical wound    left AKA   Noncompliance     PAD (peripheral artery disease) (HCC)    a.  s/p left femoral to PT artery bypass with propaten on 02/16/16.    Past Surgical History:  Procedure Laterality Date   ABDOMINAL AORTOGRAM W/LOWER EXTREMITY N/A 08/02/2016   Procedure: Abdominal Aortogram w/Lower Extremity;  Surgeon: Penne Lonni Colorado, MD;  Location: [facility] INVASIVE CV LAB;  Service: Cardiovascular;  Laterality: N/A;   ABOVE KNEE LEG AMPUTATION Right 12/04/2016   AMPUTATION Left 06/25/2016   Procedure: AMPUTATION ABOVE KNEE;  Surgeon: Gaile LELON New, MD;  Location: [facility] OR;  Service: Vascular;  Laterality: Left;   AMPUTATION Right 12/04/2016   Procedure: RIGHT AMPUTATION ABOVE KNEE;  Surgeon: [place] Penne Lonni, MD;  Location: [facility] OR;  Service: Vascular;  Laterality: Right;   AMPUTATION Left 10/08/2017   Procedure: LEFT ABOVE KNEE AMPUTATION REVISION;  Surgeon: [place] Penne Lonni, MD;  Location: [facility] OR;  Service: Vascular;  Laterality: Left;   APPLICATION OF WOUND VAC Left 02/09/2021   Procedure: APPLICATION OF WOUND VAC;  Surgeon: [place] Penne Lonni, MD;  Location: [facility] OR;  Service: Vascular;  Laterality: Left;   BYPASS GRAFT POPLITEAL TO TIBIAL Left 06/21/2016   Procedure: BYPASS GRAFT POPLITEAL TO TIBIAL USING GORE PROPATEN GRAFT;  Surgeon: Penne Lonni Colorado, MD;  Location: [facility] OR;  Service: Vascular;  Laterality: Left;   EMBOLECTOMY Left 06/21/2016   Procedure: EMBOLECTOMY left lower extremity.;  Surgeon: Penne Lonni Colorado, MD;  Location: [facility] OR;  Service: Vascular;  Laterality: Left;  ENDARTERECTOMY FEMORAL Left 02/16/2016   Procedure: LEFT FEMORAL ENDARTERECTOMY;  Surgeon: Penne Lonni Colorado, MD;  Location: [facility] OR;  Service: Vascular;  Laterality: Left;   FEMORAL BYPASS  02/16/2016   LEFT FEMORAL-POSTERIOR TIBIAL ARTERY BYPASS  WITH PROPATEN 6 MM X 80 CM VASCULAR RING GRAFT (Left)   FEMORAL-TIBIAL BYPASS GRAFT Left 02/16/2016   Procedure: LEFT FEMORAL-POSTERIOR TIBIAL ARTERY BYPASS  WITH PROPATEN  6 MM X 80 CM VASCULAR RING GRAFT;  Surgeon: Penne Lonni Colorado, MD;  Location: [facility] OR;  Service: Vascular;  Laterality: Left;   FRACTURE SURGERY     I & D EXTREMITY Left 09/03/2016   Procedure: IRRIGATION AND DEBRIDEMENT EXTREMITY - LEFT AKA;  Surgeon: Penne Lonni Colorado, MD;  Location: [facility] OR;  Service: Vascular;  Laterality: Left;   I & D EXTREMITY Left 01/09/2018   Procedure: IRRIGATION AND DEBRIDEMENT GROIN;  Surgeon: [place] Penne Lonni, MD;  Location: [facility] OR;  Service: Vascular;  Laterality: Left;   INCISION AND DRAINAGE Left 02/09/2021   Procedure: INCISION AND DRAINAGE OF LEFT GROIN WOUND;  Surgeon: [place] Penne Lonni, MD;  Location: [facility] OR;  Service: Vascular;  Laterality: Left;   PATCH ANGIOPLASTY Left 02/16/2016   Procedure: PATCH ANGIOPLASTY WITH GEORGE BIOLOGIC PATCH 1 CM X 6 CM;  Surgeon: Penne Lonni Colorado, MD;  Location: [facility] OR;  Service: Vascular;  Laterality: Left;   PATCH ANGIOPLASTY Left 01/09/2018   Procedure: PATCH ANGIOPLASTY OF LEFT COMMON FEMORAL AND PROFUNDA  USING SAPHENOUS VEIN;  Surgeon: [place] Penne Lonni, MD;  Location: [facility] OR;  Service: Vascular;  Laterality: Left;   PERIPHERAL VASCULAR BALLOON ANGIOPLASTY Right 08/02/2016   Procedure: Peripheral Vascular Balloon Angioplasty;  Surgeon: Penne Lonni Colorado, MD;  Location: [facility] INVASIVE CV LAB;  Service: Cardiovascular;  Laterality: Right;  peroneal artery   PERIPHERAL VASCULAR CATHETERIZATION N/A 07/07/2015   Procedure: Abdominal Aortogram w/Lower Extremity;  Surgeon: Redell LITTIE Door, MD;  Location: [facility] INVASIVE CV LAB;  Service: Cardiovascular;  Laterality: N/A;   PERIPHERAL VASCULAR CATHETERIZATION N/A 02/15/2016   Procedure: Abdominal Aortogram w/Lower Extremity;  Surgeon: Penne Lonni Colorado, MD;  Location: [facility] INVASIVE CV LAB;  Service: Cardiovascular;  Laterality: N/A;   PERIPHERAL VASCULAR CATHETERIZATION Bilateral 06/20/2016   Procedure: Lower Extremity Angiography;  Surgeon:  Penne Lonni Colorado, MD;  Location: [facility] INVASIVE CV LAB;  Service: Cardiovascular;  Laterality: Bilateral;  limited runoff on rt leg thrombolysis lt leg bypass graft   PERIPHERAL VASCULAR INTERVENTION Right 08/02/2016   Procedure: Peripheral Vascular Intervention;  Surgeon: Penne Lonni Colorado, MD;  Location: [facility] INVASIVE CV LAB;  Service: Cardiovascular;  Laterality: Right;  Femoral , popliteal   TIBIA FRACTURE SURGERY Left 2000   per patient, Stephen Bowen has rod inside his left leg.   VEIN HARVEST Left 01/09/2018   Procedure: LEFT SAPHENOUS VEIN HARVEST;  Surgeon: [place] Penne Lonni, MD;  Location: [facility] OR;  Service: Vascular;  Laterality: Left;    Family History  Problem Relation Age of Onset   Cancer Mother        deceased in 78s    Heart attack Mother        diseased   Heart disease Father    Stroke Sister        age 67s   Heart disease Brother        pacemaker in his 68s    Social History:  reports that Stephen Bowen has been smoking cigarettes. Stephen Bowen has a 120 pack-year smoking history. Stephen Bowen has never used smokeless tobacco. Stephen Bowen reports that Stephen Bowen  does not currently use alcohol after a past usage of about 14.0 standard drinks of alcohol per week. Stephen Bowen reports that Stephen Bowen does not use drugs.  Allergies: No Known Allergies  Medications: I have reviewed the patient's current medications.  The PMH, PSH, Medications, Allergies, and SH were reviewed and updated.  ROS: Constitutional: Negative for fever, weight loss and weight gain. Cardiovascular: Negative for chest pain and dyspnea on exertion. Respiratory: Is not experiencing shortness of breath at rest. Gastrointestinal: Negative for nausea and vomiting. Neurological: Negative for headaches. Psychiatric: The patient is not nervous/anxious  Blood pressure 101/72, pulse 93, temperature 98 F (36.7 C), SpO2 93%. There is no height or weight on file to calculate BMI.  PHYSICAL EXAM:  Exam: General: Well-developed,  well-nourished Respiratory Respiratory effort: Equal inspiration and expiration without stridor Cardiovascular Peripheral Vascular: Warm extremities with equal color/perfusion Eyes: No nystagmus with equal extraocular motion bilaterally Neuro/Psych/Balance: Patient oriented to person, place, and time; Appropriate mood and affect; Gait is intact with no imbalance; Cranial nerves I-XII are intact Head and Face Inspection: Normocephalic and atraumatic without mass or lesion Palpation: Facial skeleton intact without bony stepoffs Salivary Glands: No mass or tenderness Facial Strength: Facial motility symmetric and full bilaterally ENT Pinna: External ear intact and fully developed External canal: Canal is patent with intact skin Tympanic Membrane: Clear and mobile External Nose: No scar or anatomic deformity Internal Nose: Septum is deviated to the left. No polyp, or purulence. Mucosal edema and erythema present.  Bilateral inferior turbinate hypertrophy.  Lips, Teeth, and gums: Mucosa and teeth intact and viable TMJ: No pain to palpation with full mobility Oral cavity/oropharynx: No erythema or exudate, no lesions present Nasopharynx: No mass or lesion with intact mucosa There is thickening/exophytic growth along pharyngeal wall above the level of the larynx Hypopharynx: Intact mucosa with pooling of secretions Larynx Glottic: Full true vocal cord mobility without lesion or mass Supraglottic: Normal appearing epiglottis and AE folds VF atrophy Interarytenoid Space: Moderate pachydermia&edema Subglottic Space: Patent without lesion or edema Neck Neck and Trachea: Midline trachea without mass or lesion Thyroid: No mass or nodularity Lymphatics: No lymphadenopathy  Procedure: Preoperative diagnosis: dysphagia mass of the hypopharynx  Postoperative diagnosis:   Same + pharyngeal mass  Procedure: Flexible fiberoptic laryngoscopy  Surgeon: Elena Larry, MD  Anesthesia: Topical  lidocaine  and Afrin Complications: None Condition is stable throughout exam  Indications and consent:  The patient presents to the clinic with above symptoms. Indirect laryngoscopy view was incomplete. Thus it was recommended that they undergo a flexible fiberoptic laryngoscopy. All of the risks, benefits, and potential complications were reviewed with the patient preoperatively and verbal informed consent was obtained.  Procedure: The patient was seated upright in the clinic. Topical lidocaine  and Afrin were applied to the nasal cavity. After adequate anesthesia had occurred, I then proceeded to pass the flexible telescope into the nasal cavity. The nasal cavity was patent without rhinorrhea or polyp. The nasopharynx was also patent without mass or lesion. The base of tongue was visualized and was normal. There were no signs of pooling of secretions in the piriform sinuses. The true vocal folds were mobile bilaterally. There were no signs of glottic or supraglottic mucosal lesion or mass. There was moderate interarytenoid pachydermia and post cricoid edema. The telescope was then slowly withdrawn and the patient tolerated the procedure throughout.   Studies Reviewed: PET CT 03/09/24 IMPRESSION: 1. 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 2. No hypermetabolic cervical  adenopathy or evidence of metastatic disease. 3. Age advanced vascular disease. 4. Aortic atherosclerosis.  CT neck 02/27/24  FINDINGS: Nasopharynx: None.  Oral cavity and oropharynx:  Hypopharynx and larynx: There is irregular soft tissue thickening of the posterior hypopharynx extends into the piriform sinuses concerning for neoplasm consider inflammatory process unlikely measures 3.5 cm in the trans the 1st dimension and 1 cm in thickness. Again this is in the hypopharynx and extends the level of the vallecula to just above the larynx  Retropharyngeal  spaces: Negative  Salivary glands: Parotid and subareolar glands negative.  Lymph nodes: No cervical adenopathy.  Thyroid: Negative.  Vasculature: Cerebrovascular disease of the proximal left internal carotid artery and lesser extent right proximal internal carotid artery.  Orbits: Negative.  Paranasal sinuses and mastoids: Clear  Lung apices: Emphysematous changes.  Upper mediastinum: Negative  Bones: Degenerative changes in the cervical spine.  IMPRESSION: Thickening/mass of the posterior hypopharynx concerning for neoplasm. Recommend direct visualization and likely tissue sampling.  Assessment/Plan: Encounter Diagnoses  Name Primary?   Pharyngeal mass Yes   Dysphagia, unspecified type     Assessment and Plan Assessment & Plan Pharyngeal neoplasm  Dysphagia for 10 days, unable to swallow solids. CT scan shows no abnormal lymph nodes, but evidence of pharyngeal mass which was also FDG avid on recent PET/CT. Scope exam with a mass in the area of concern along pharyngeal muscles above the level of the larynx. Differential includes malignancy. Biopsy needed for diagnosis - Schedule biopsy under general anesthesia to evaluate the area of concern. - will discuss in TB if malignancy confirmed after biopsy   Dysphagia to solids  B/l VF were mobile on scope exam, but there was evidence pooling of secretions in pyriform sinuses - MBS esophagram    Thank you for allowing me to participate in the care of this patient. Please do not hesitate to contact me with any questions or concerns.   Elena Larry, MD Otolaryngology [facility] Phone: [phone] Fax: [phone]    03/10/2024, 11:22 AM

## 2024-03-10 NOTE — Progress Notes (Signed)
 ENT CONSULT:  Reason for Consult: concern for a hypopharyngeal mass on PET/CT   HPI: Discussed the use of AI scribe software for clinical note transcription with the patient, who gave verbal consent to proceed.  History of Present Illness He is a 67 year old male who presents with difficulty swallowing and PET/CT avid pharyngeal mass.  He has been experiencing difficulty swallowing for approximately ten days, currently unable to eat solid foods and sustaining himself on liquid shakes. Prior to this, he was able to eat normally.   A recent CT scan showed no abnormal lymph nodes but demonstrated thickening along the pharyngeal wall, and it was FDG avid on PET/CT. He is a current smoker with 120-pack year hx. He has a history of b/l AKA and lives in a facility.    Records Reviewed:  Cancer [facility]en Note 03/02/24 Heme/Onc Stephen Bach NP Stephen Bowen is a 68 y.o. male with a history of mass in the throat who is referred in consultation by Stephen Georgi, MD for assessment and management. He states that within the last 7 days he noted difficulty swallowing and coughing up food that he does swallow. 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 Recommend direct visualization and likely tissue sampling. CT chest - no acute cardiopulmonary findings; no pulmonary embolism. He denies fever, chills, nausea Bowen vomiting. He denies shortness of breath Bowen chest pain. He denies issue with bowel Bowen bladder. He does reside in a skilled nursing facility. He has had bilateral AKA. He is an everyday smoker of approximately 6 cigarettes daily. He denies use of alcohol Bowen other drugs.       Past Medical History:  Diagnosis Date   Coronary artery disease    Dementia (HCC)    Encephalopathy acute 07/2016   HTN (hypertension)    Hyperlipemia    Myocardial infarction Stephen [facility])    Non-healing surgical wound    left AKA   Noncompliance     PAD (peripheral artery disease) (HCC)    a.  s/p left femoral to PT artery bypass with propaten on 02/16/16.    Past Surgical History:  Procedure Laterality Date   ABDOMINAL AORTOGRAM W/LOWER EXTREMITY N/A 08/02/2016   Procedure: Abdominal Aortogram w/Lower Extremity;  Surgeon: Stephen Bowen Colorado, MD;  [facility];  Service: Cardiovascular;  Laterality: N/A;   ABOVE KNEE LEG AMPUTATION Right 12/04/2016   AMPUTATION Left 06/25/2016   Procedure: AMPUTATION ABOVE KNEE;  Surgeon: Stephen LELON New, MD;  Location: [facility];  Service: Vascular;  Laterality: Left;   AMPUTATION Right 12/04/2016   Procedure: RIGHT AMPUTATION ABOVE KNEE;  Surgeon: Bowen Stephen Lonni, MD;  Location: [facility];  Service: Vascular;  Laterality: Right;   AMPUTATION Left 10/08/2017   Procedure: LEFT ABOVE KNEE AMPUTATION REVISION;  Surgeon: Bow[facility] Stephen Lonni, MD;  Location: [facility];  Service: Vascular;  Laterality: Left;   APPLICATION OF WOUND VAC Left 02/09/2021   Procedure: APPLICATION OF WOUND VAC;  Surgeon: Bow[facility] Stephen Lonni, MD;  Location: [facility];  Service: Vascular;  Laterality: Left;   BYPASS GRAFT POPLITEAL TO TIBIAL Left 06/21/2016   Procedure: BYPASS GRAFT POPLITEAL TO TIBIAL USING GORE PROPATEN GRAFT;  Surgeon: Stephen Bowen Colorado, MD;  Location: [facility];  Service: Vascular;  Laterality: Left;   EMBOLECTOMY Left 06/21/2016   Procedure: EMBOLECTOMY left lower extremity.;  Surgeon: Stephen Bowen Colorado, MD;  Location: [facility];  Service: Vascular;  Laterality: Left;  ENDARTERECTOMY FEMORAL Left 02/16/2016   Procedure: LEFT FEMORAL ENDARTERECTOMY;  Surgeon: Stephen [facility]en Colorado, MD;  Location: [facility];  Service: Vascular;  Laterality: Left;   FEMORAL BYPASS  02/16/2016   LEFT FEMORAL-POSTERIOR TIBIAL ARTERY BYPASS  WITH PROPATEN 6 MM X 80 CM VASCULAR RING GRAFT (Left)   FEMORAL-TIBIAL BYPASS GRAFT Left 02/16/2016   Procedure: LEFT FEMORAL-POSTERIOR TIBIAL ARTERY BYPASS  WITH PROPATEN  6 MM X 80 CM VASCULAR RING GRAFT;  Surgeon: Stephen Bowen Colorado, MD;  Location: [facility];  Service: Vascular;  Laterality: Left;   FRACTURE SURGERY     I & D EXTREMITY Left 09/03/2016   Procedure: IRRIGATION AND DEBRIDEMENT EXTREMITY - LEFT AKA;  Surgeon: Stephen Bowen Colorado, MD;  Location: [facility];  Service: Vascular;  Laterality: Left;   I & D EXTREMITY Left 01/09/2018   Procedure: IRRIGATION AND DEBRIDEMENT GROIN;  Surgeon: [facility] Stephen Lonni, MD;  Location: [facility];  Service: Vascular;  Laterality: Left;   INCISION AND DRAINAGE Left 02/09/2021   Procedure: INCISION AND DRAINAGE OF LEFT GROIN WOUND;  Surgeon: Bow[facility] Stephen Lonni, MD;  Location: [facility];  Service: Vascular;  Laterality: Left;   PATCH ANGIOPLASTY Left 02/16/2016   Procedure: PATCH ANGIOPLASTY WITH GEORGE BIOLOGIC PATCH 1 CM X 6 CM;  Surgeon: Stephen Bowen Colorado, MD;  Location: [facility];  Service: Vascular;  Laterality: Left;   PATCH ANGIOPLASTY Left 01/09/2018   Procedure: PATCH ANGIOPLASTY OF LEFT COMMON FEMORAL AND PROFUNDA  USING SAPHENOUS VEIN;  Surgeon: Bow[facility] Stephen Lonni, MD;  Location: [facility];  Service: Vascular;  Laterality: Left;   PERIPHERAL VASCULAR BALLOON ANGIOPLASTY Right 08/02/2016   Procedure: Peripheral Vascular Balloon Angioplasty;  Surgeon: Stephen Bowen Colorado, MD;  Location: [facility];  Service: Cardiovascular;  Laterality: Right;  peroneal artery   PERIPHERAL VASCULAR CATHETERIZATION N/A 07/07/2015   Procedure: Abdominal Aortogram w/Lower Extremity;  Surgeon: Stephen LITTIE Door, MD;  Location: [facility];  Service: Cardiovascular;  Laterality: N/A;   PERIPHERAL VASCULAR CATHETERIZATION N/A 02/15/2016   Procedure: Abdominal Aortogram w/Lower Extremity;  Surgeon: Stephen Bowen Colorado, MD;  Location: [facility] INVASIVE [facility];  Service: Cardiovascular;  Laterality: N/A;   PERIPHERAL VASCULAR CATHETERIZATION Bilateral 06/20/2016   Procedure: Lower Extremity Angiography;  Surgeon:  Stephen Bowen Colorado, MD;  Location: [facility] INVASIVE [facility];  Service: Cardiovascular;  Laterality: Bilateral;  limited runoff on rt leg thrombolysis lt leg bypass graft   PERIPHERAL VASCULAR INTERVENTION Right 08/02/2016   Procedure: Peripheral Vascular Intervention;  Surgeon: Stephen Bowen Colorado, MD;  Location: [facility] INVASIVE [facility];  Service: Cardiovascular;  Laterality: Right;  Femoral , popliteal   TIBIA FRACTURE SURGERY Left 2000   per patient, he has rod inside his left leg.   VEIN HARVEST Left 01/09/2018   Procedure: LEFT SAPHENOUS VEIN HARVEST;  Surgeon: Bowen Stephen Lonni, MD;  Location: [facility];  Service: Vascular;  Laterality: Left;    Family History  Problem Relation Age of Onset   Cancer Mother        deceased in 78s    Heart attack Mother        diseased   Heart disease Father    Stroke Sister        age 67s   Heart disease Brother        pacemaker in his 68s    Social History:  reports that he has been smoking cigarettes. He has a 120 pack-year smoking history. He has never used smokeless tobacco. He reports that he  does not currently use alcohol after a past usage of about 14.0 standard drinks of alcohol per week. He reports that he does not use drugs.  Allergies: No Known Allergies  Medications: I have reviewed the patient's current medications.  The PMH, PSH, Medications, Allergies, and SH were reviewed and updated.  ROS: Constitutional: Negative for fever, weight loss and weight gain. Cardiovascular: Negative for chest pain and dyspnea on exertion. Respiratory: Is not experiencing shortness of breath at rest. Gastrointestinal: Negative for nausea and vomiting. Neurological: Negative for headaches. Psychiatric: The patient is not nervous/anxious  Blood pressure 101/72, pulse 93, temperature 98 F (36.7 C), SpO2 93%. There is no height Bowen weight on file to calculate BMI.  PHYSICAL EXAM:  Exam: General: Well-developed,  well-nourished Respiratory Respiratory effort: Equal inspiration and expiration without stridor Cardiovascular Peripheral Vascular: Warm extremities with equal color/perfusion Eyes: No nystagmus with equal extraocular motion bilaterally Neuro/Psych/Balance: Patient oriented to person, place, and time; Appropriate mood and affect; Gait is intact with no imbalance; Cranial nerves I-XII are intact Head and Face Inspection: Normocephalic and atraumatic without mass Bowen lesion Palpation: Facial skeleton intact without bony stepoffs Salivary Glands: No mass Bowen tenderness Facial Strength: Facial motility symmetric and full bilaterally ENT Pinna: External ear intact and fully developed External canal: Canal is patent with intact skin Tympanic Membrane: Clear and mobile External Nose: No scar Bowen anatomic deformity Internal Nose: Septum is deviated to the left. No polyp, Bowen purulence. Mucosal edema and erythema present.  Bilateral inferior turbinate hypertrophy.  Lips, Teeth, and gums: Mucosa and teeth intact and viable TMJ: No pain to palpation with full mobility Oral cavity/oropharynx: No erythema Bowen exudate, no lesions present Nasopharynx: No mass Bowen lesion with intact mucosa There is thickening/exophytic growth along pharyngeal wall above the level of the larynx Hypopharynx: Intact mucosa with pooling of secretions Larynx Glottic: Full true vocal cord mobility without lesion Bowen mass Supraglottic: Normal appearing epiglottis and AE folds VF atrophy Interarytenoid Space: Moderate pachydermia&edema Subglottic Space: Patent without lesion Bowen edema Neck Neck and Trachea: Midline trachea without mass Bowen lesion Thyroid: No mass Bowen nodularity Lymphatics: No lymphadenopathy  Procedure: Preoperative diagnosis: dysphagia mass of the hypopharynx  Postoperative diagnosis:   Same + pharyngeal mass  Procedure: Flexible fiberoptic laryngoscopy  Surgeon: Elena Larry, MD  Anesthesia: Topical  lidocaine  and Afrin Complications: None Condition is stable throughout exam  Indications and consent:  The patient presents to the clinic with above symptoms. Indirect laryngoscopy view was incomplete. Thus it was recommended that they undergo a flexible fiberoptic laryngoscopy. All of the risks, benefits, and potential complications were reviewed with the patient preoperatively and verbal informed consent was obtained.  Procedure: The patient was seated upright in the clinic. Topical lidocaine  and Afrin were applied to the nasal cavity. After adequate anesthesia had occurred, I then proceeded to pass the flexible telescope into the nasal cavity. The nasal cavity was patent without rhinorrhea Bowen polyp. The nasopharynx was also patent without mass Bowen lesion. The base of tongue was visualized and was normal. There were no signs of pooling of secretions in the piriform sinuses. The true vocal folds were mobile bilaterally. There were no signs of glottic Bowen supraglottic mucosal lesion Bowen mass. There was moderate interarytenoid pachydermia and post cricoid edema. The telescope was then slowly withdrawn and the patient tolerated the procedure throughout.   Studies Reviewed: PET CT 03/09/24 IMPRESSION: 1. 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 2. No hypermetabolic cervical  adenopathy Bowen evidence of metastatic disease. 3. Age advanced vascular disease. 4. Aortic atherosclerosis.  CT neck 02/27/24  FINDINGS: Nasopharynx: None.  Oral cavity and oropharynx:  Hypopharynx and larynx: There is irregular soft tissue thickening of the posterior hypopharynx extends into the piriform sinuses concerning for neoplasm consider inflammatory process unlikely measures 3.5 cm in the trans the 1st dimension and 1 cm in thickness. Again this is in the hypopharynx and extends the level of the vallecula to just above the larynx  Retropharyngeal  spaces: Negative  Salivary glands: Parotid and subareolar glands negative.  Lymph nodes: No cervical adenopathy.  Thyroid: Negative.  Vasculature: Cerebrovascular disease of the proximal left internal carotid artery and lesser extent right proximal internal carotid artery.  Orbits: Negative.  Paranasal sinuses and mastoids: Clear  Lung apices: Emphysematous changes.  Upper mediastinum: Negative  Bones: Degenerative changes in the cervical spine.  IMPRESSION: Thickening/mass of the posterior hypopharynx concerning for neoplasm. Recommend direct visualization and likely tissue sampling.  Assessment/Plan: Encounter Diagnoses  Name Primary?   Pharyngeal mass Yes   Dysphagia, unspecified type     Assessment and Plan Assessment & Plan Pharyngeal neoplasm  Dysphagia for 10 days, unable to swallow solids. CT scan shows no abnormal lymph nodes, but evidence of pharyngeal mass which was also FDG avid on recent PET/CT. Scope exam with a mass in the area of concern along pharyngeal muscles above the level of the larynx. Differential includes malignancy. Biopsy needed for diagnosis - Schedule biopsy under general anesthesia to evaluate the area of concern. - will discuss in TB if malignancy confirmed after biopsy   Dysphagia to solids  B/l VF were mobile on scope exam, but there was evidence pooling of secretions in pyriform sinuses - MBS esophagram    Thank you for allowing me to participate in the care of this patient. Please do not hesitate to contact me with any questions Bowen concerns.   Elena Larry, MD Otolaryngology [facility] Phone: [phone] Fax: [phone]    03/10/2024, 11:22 AM

## 2024-03-10 NOTE — Telephone Encounter (Signed)
 Attempted to contact [facility] to schedule OP MBS for patient. Scheduler unavailable and unable to leave VM. RKEEL

## 2024-03-10 NOTE — Telephone Encounter (Signed)
 LVM for patient to call to schedule appointment.  Per Dr. Soldatova she would like to see him 03/11/2024 at 11 am

## 2024-03-17 ENCOUNTER — Telehealth (HOSPITAL_COMMUNITY): Payer: Self-pay | Admitting: *Deleted

## 2024-03-17 NOTE — Telephone Encounter (Signed)
 2nd attempt to contact [facility] to schedule OP MBS for patient. Left message and our ph number with [facility] for the scheduler to return our outreaches. (AHARRIS)

## 2024-03-20 ENCOUNTER — Ambulatory Visit (HOSPITAL_COMMUNITY)
Admission: RE | Admit: 2024-03-20 | Discharge: 2024-03-20 | Disposition: A | Discharge status: Home / Self Care | Source: Ambulatory Visit | Attending: Otolaryngology | Admitting: Otolaryngology

## 2024-03-20 DIAGNOSIS — R1310 Dysphagia, unspecified: Secondary | ICD-10-CM | POA: Insufficient documentation

## 2024-03-26 ENCOUNTER — Encounter (HOSPITAL_COMMUNITY): Payer: Self-pay

## 2024-03-26 ENCOUNTER — Other Ambulatory Visit: Payer: Self-pay

## 2024-03-26 NOTE — Progress Notes (Addendum)
 PCP - Trinidad Glisson, MD  Cardiologist -   PPM/ICD - denies Device Orders - n/a Rep Notified - n/a  Chest x-ray -  EKG - DOS Stress Test -  ECHO - 11-04-21 Cardiac Cath -   CPAP -   DM - pre nurse blood sugar is not monitored daily    Blood Thinner Instructions: denies Aspirin  Instructions: May continue per Dr. Soldatova  ERAS Protcol - NPO  COVID TEST- n/a  Anesthesia review: Yes hx of CAD, HTN, MI, CAD, bil AKA  Patient verbally denies any shortness of breath, fever, cough and chest pain during phone call   -------------  SDW INSTRUCTIONS given:  Your procedure is scheduled on March 27, 2024.  Report to [facility] Main Entrance A at 9:30 A.M., and check in at the Admitting office.  Call this number if you have problems the morning of surgery:  [phone]   Remember:  Do not eat or drink  after midnight the night before your surgery      Take these medicines the morning of surgery with A SIP OF WATER  gabapentin  (NEURONTIN )  levETIRAcetam (KEPPRA)  Metoprolol  Tartrate  aspirin      As of today, STOP taking any Aspirin  (unless otherwise instructed by your surgeon) Aleve, Naproxen, Ibuprofen, Motrin, Advil, Goody's, BC's, all herbal medications, fish oil, and all vitamins.                      Do not wear jewelry, make up, or nail polish            Do not wear lotions, powders, perfumes/colognes, or deodorant.            Do not shave 48 hours prior to surgery.  Men may shave face and neck.            Do not bring valuables to the hospital.            [facility] is not responsible for any belongings or valuables.  Do NOT Smoke (Tobacco/Vaping) 24 hours prior to your procedure If you use a CPAP at night, you may bring all equipment for your overnight stay.   Contacts, glasses, dentures or bridgework may not be worn into surgery.      For patients admitted to the hospital, discharge time will be determined by your treatment team.   Patients  discharged the day of surgery will not be allowed to drive home, and someone needs to stay with them for 24 hours.    Special instructions:   [place] [facility]- Preparing For Surgery  Before surgery, you can play an important role. Because skin is not sterile, your skin needs to be as free of germs as possible. You can reduce the number of germs on your skin by washing with CHG (chlorahexidine gluconate) Soap before surgery.  CHG is an antiseptic cleaner which kills germs and bonds with the skin to continue killing germs even after washing.    Oral Hygiene is also important to reduce your risk of infection.  Remember - BRUSH YOUR TEETH THE MORNING OF SURGERY WITH YOUR REGULAR TOOTHPASTE  Please do not use if you have an allergy to CHG or antibacterial soaps. If your skin becomes reddened/irritated stop using the CHG.  Do not shave (including legs and underarms) for at least 48 hours prior to first CHG shower. It is OK to shave your face.  Please follow these instructions carefully.   Shower the [employer] [facility] and the  MORNING OF SURGERY with DIAL Soap.   Pat yourself dry with a CLEAN TOWEL.  Wear CLEAN PAJAMAS to bed the night before surgery  Place CLEAN SHEETS on your bed the night of your first shower and DO NOT SLEEP WITH PETS.   Day of Surgery: Please shower morning of surgery  Wear Clean/Comfortable clothing the morning of surgery Do not apply any deodorants/lotions.   Remember to brush your teeth WITH YOUR REGULAR TOOTHPASTE.

## 2024-03-26 NOTE — Anesthesia Preprocedure Evaluation (Signed)
 Anesthesia Evaluation  Patient identified by MRN, date of birth, ID band Patient awake    Reviewed: Allergy & Precautions, H&P , NPO status , Patient's Chart, lab work & pertinent test results  Airway Mallampati: II  TM Distance: >3 FB Neck ROM: Full    Dental  (+) Dental Advisory Given   Pulmonary neg pulmonary ROS, Current Smoker and Patient abstained from smoking.   Pulmonary exam normal breath sounds clear to auscultation       Cardiovascular hypertension, Pt. on medications + CAD, + Past MI and + Peripheral Vascular Disease  Normal cardiovascular exam Rhythm:Regular Rate:Normal     Neuro/Psych       Dementia CVA  negative psych ROS   GI/Hepatic Neg liver ROS,GERD  ,,  Endo/Other  negative endocrine ROSdiabetes, Type 2    Renal/GU   negative genitourinary   Musculoskeletal negative musculoskeletal ROS (+)    Abdominal   Peds negative pediatric ROS (+)  Hematology negative hematology ROS (+)   Anesthesia Other Findings   Reproductive/Obstetrics negative OB ROS                              Anesthesia Physical Anesthesia Plan  ASA: 3  Anesthesia Plan: General   Post-op Pain Management:    Induction: Intravenous  PONV Risk Score and Plan: 1 and Ondansetron  and Treatment may vary due to age or medical condition  Airway Management Planned: Oral ETT  Additional Equipment:   Intra-op Plan:   Post-operative Plan: Extubation in OR  Informed Consent: I have reviewed the patients History and Physical, chart, labs and discussed the procedure including the risks, benefits and alternatives for the proposed anesthesia with the patient or authorized representative who has indicated his/her understanding and acceptance.     Dental advisory given  Plan Discussed with: CRNA  Anesthesia Plan Comments: (PAT note by Lynwood Hope, PA-C: 68 year old male who resides in skilled nursing  facility with pertinent history including current smoker, PAD s/p bilateral AKA, HTN, HLD, GERD, CVA, anterior STEMI (AKA 06/25/2016 complicated by periprocedural ischemic CVA and aborted NSTEMI -suspected spontaneous reperfusion or collateral recruitment per cardiology notes).  Most recent echocardiogram 11/04/2021 showed normal LV systolic function with EF 60 to 65%, impaired relaxation, mild mitral regurgitation.  Patient recently seen by PCP with complaint of dysphagia, unable to swallow solids.  PET/CT 03/09/2024 showed Large markedly hypermetabolic mass occupying the entire posterior hypopharynx consistent with primary neoplasm..  Seen by Dr. Soldatova 03/10/2024.  Flexible fiberoptic laryngoscopy at that time showed, The nasal cavity was patent without rhinorrhea or polyp. The nasopharynx was also patent without mass or lesion. The base of tongue was visualized and was normal. There were no signs of pooling of secretions in the piriform sinuses. The true vocal folds were mobile bilaterally. There were no signs of glottic or supraglottic mucosal lesion or mass. There was moderate interarytenoid pachydermia and post cricoid edema. The telescope was then slowly withdrawn and the patient tolerated the procedure throughout.  Biopsy under general anesthesia recommended.  Patient will need day of surgery labs and evaluation.  PET/CT 03/09/2024: IMPRESSION: 1. 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 2. No hypermetabolic cervical adenopathy or evidence of metastatic disease. 3. Age advanced vascular disease. 4. Aortic atherosclerosis.  TTE 11/04/2021: Conclusions: 1.  There is mild concentric LVH. 2.  Overall left ventricular systolic function is normal with an EF between 60 to 65%.  Definity contrast was used. 3.  The diastolic filling pattern indicates impaired relaxation. 4.  There is trace to mild mitral regurgitation. 5.   Trace tricuspid regurgitation present. 6.  There is no pericardial effusion.   )         Anesthesia Quick Evaluation

## 2024-03-26 NOTE — Progress Notes (Signed)
 Anesthesia Chart Review: Same day workup  68 year old male who resides in skilled nursing facility with pertinent history including current smoker, PAD s/p bilateral AKA, HTN, HLD, GERD, CVA, anterior STEMI (AKA 06/25/2016 complicated by periprocedural ischemic CVA and aborted NSTEMI -suspected spontaneous reperfusion or collateral recruitment per cardiology notes).  Most recent echocardiogram 11/04/2021 showed normal LV systolic function with EF 60 to 65%, impaired relaxation, mild mitral regurgitation.  Patient recently seen by PCP with complaint of dysphagia, unable to swallow solids.  PET/CT 03/09/2024 showed Large markedly hypermetabolic mass occupying the entire posterior hypopharynx consistent with primary neoplasm..  Seen by Dr. Soldatova 03/10/2024.  Flexible fiberoptic laryngoscopy at that time showed, The nasal cavity was patent without rhinorrhea or polyp. The nasopharynx was also patent without mass or lesion. The base of tongue was visualized and was normal. There were no signs of pooling of secretions in the piriform sinuses. The true vocal folds were mobile bilaterally. There were no signs of glottic or supraglottic mucosal lesion or mass. There was moderate interarytenoid pachydermia and post cricoid edema. The telescope was then slowly withdrawn and the patient tolerated the procedure throughout.  Biopsy under general anesthesia recommended.  Patient will need day of surgery labs and evaluation.  PET/CT 03/09/2024: IMPRESSION: 1. 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 2. No hypermetabolic cervical adenopathy or evidence of metastatic disease. 3. Age advanced vascular disease. 4. Aortic atherosclerosis.   TTE 11/04/2021: Conclusions: 1.  There is mild concentric LVH. 2.  Overall left ventricular systolic function is normal with an EF between 60 to 65%.  Definity contrast was used. 3.  The diastolic filling  pattern indicates impaired relaxation. 4.  There is trace to mild mitral regurgitation. 5.  Trace tricuspid regurgitation present. 6.  There is no pericardial effusion.    Lynwood Geofm RIGGERS [facility] Short Stay Center/Anesthesiology Phone [phone] 03/26/2024 4:56 PM

## 2024-03-26 NOTE — Progress Notes (Signed)
 SDW INSTRUCTIONS given:   Your procedure is scheduled on March 27, 2024.             Report to [facility] Main Entrance A at 9:30 A.M., and check in at the Admitting office.             Call this number if you have problems the morning of surgery:             [phone]               Remember:             Do not eat or drink  after midnight the night before your surgery                              Take these medicines the morning of surgery with A SIP OF WATER  gabapentin  (NEURONTIN )  levETIRAcetam (KEPPRA)  Metoprolol  Tartrate  aspirin         As of today, STOP taking any Aspirin  (unless otherwise instructed by your surgeon) Aleve, Naproxen, Ibuprofen, Motrin, Advil, Goody's, BC's, all herbal medications, fish oil, and all vitamins.                       Do not wear jewelry, make up, or nail polish            Do not wear lotions, powders, perfumes/colognes, or deodorant.            Do not shave 48 hours prior to surgery.  Men may shave face and neck.            Do not bring valuables to the hospital.            [facility] is not responsible for any belongings or valuables.   Do NOT Smoke (Tobacco/Vaping) 24 hours prior to your procedure If you use a CPAP at night, you may bring all equipment for your overnight stay.   Contacts, glasses, dentures or bridgework may not be worn into surgery.      For patients admitted to the hospital, discharge time will be determined by your treatment team.   Patients discharged the day of surgery will not be allowed to drive home, and someone needs to stay with them for 24 hours.       Special instructions:   [facility]- Preparing For Surgery   Before surgery, you can play an important role. Because skin is not sterile, your skin needs to be as free of germs as possible. You can reduce the number of germs on your skin by washing with CHG (chlorahexidine gluconate) Soap before surgery.  CHG is an antiseptic cleaner which kills germs  and bonds with the skin to continue killing germs even after washing.     Oral Hygiene is also important to reduce your risk of infection.  Remember - BRUSH YOUR TEETH THE MORNING OF SURGERY WITH YOUR REGULAR TOOTHPASTE   Please do not use if you have an allergy to CHG or antibacterial soaps. If your skin becomes reddened/irritated stop using the CHG.  Do not shave (including legs and underarms) for at least 48 hours prior to first CHG shower. It is OK to shave your face.   Please follow these instructions carefully.              Shower the NIGHT BEFORE SURGERY and the MORNING OF SURGERY with DIAL Soap.  Pat yourself dry with a CLEAN TOWEL.   Wear CLEAN PAJAMAS to bed the night before surgery   Place CLEAN SHEETS on your bed the night of your first shower and DO NOT SLEEP WITH PETS.     Day of Surgery: Please shower morning of surgery  Wear Clean/Comfortable clothing the morning of surgery Do not apply any deodorants/lotions.   Remember to brush your teeth WITH YOUR REGULAR TOOTHPASTE.

## 2024-03-27 ENCOUNTER — Ambulatory Visit (HOSPITAL_COMMUNITY): Payer: Self-pay | Admitting: Anesthesiology

## 2024-03-27 ENCOUNTER — Inpatient Hospital Stay (HOSPITAL_COMMUNITY)

## 2024-03-27 ENCOUNTER — Other Ambulatory Visit (INDEPENDENT_AMBULATORY_CARE_PROVIDER_SITE_OTHER): Payer: Self-pay | Admitting: Otolaryngology

## 2024-03-27 ENCOUNTER — Other Ambulatory Visit: Payer: Self-pay

## 2024-03-27 ENCOUNTER — Encounter (HOSPITAL_COMMUNITY): Payer: Self-pay

## 2024-03-27 ENCOUNTER — Encounter (HOSPITAL_COMMUNITY)
Admission: RE | Disposition: A | Discharge status: Skilled Nursing Facility | Payer: Self-pay | Source: Skilled Nursing Facility | Attending: Internal Medicine

## 2024-03-27 ENCOUNTER — Inpatient Hospital Stay (HOSPITAL_COMMUNITY)
Admission: RE | Admit: 2024-03-27 | Discharge: 2024-04-28 | DRG: 004 | Disposition: A | Discharge status: Skilled Nursing Facility | Source: Skilled Nursing Facility | Attending: Internal Medicine | Admitting: Internal Medicine

## 2024-03-27 DIAGNOSIS — I129 Hypertensive chronic kidney disease with stage 1 through stage 4 chronic kidney disease, or unspecified chronic kidney disease: Secondary | ICD-10-CM | POA: Diagnosis present

## 2024-03-27 DIAGNOSIS — E1122 Type 2 diabetes mellitus with diabetic chronic kidney disease: Secondary | ICD-10-CM | POA: Diagnosis present

## 2024-03-27 DIAGNOSIS — Z9911 Dependence on respirator [ventilator] status: Secondary | ICD-10-CM

## 2024-03-27 DIAGNOSIS — Z8249 Family history of ischemic heart disease and other diseases of the circulatory system: Secondary | ICD-10-CM

## 2024-03-27 DIAGNOSIS — G4700 Insomnia, unspecified: Secondary | ICD-10-CM | POA: Diagnosis present

## 2024-03-27 DIAGNOSIS — Z515 Encounter for palliative care: Principal | ICD-10-CM

## 2024-03-27 DIAGNOSIS — J392 Other diseases of pharynx: Principal | ICD-10-CM | POA: Diagnosis present

## 2024-03-27 DIAGNOSIS — F0390 Unspecified dementia without behavioral disturbance: Secondary | ICD-10-CM | POA: Diagnosis not present

## 2024-03-27 DIAGNOSIS — M549 Dorsalgia, unspecified: Secondary | ICD-10-CM | POA: Diagnosis present

## 2024-03-27 DIAGNOSIS — I2510 Atherosclerotic heart disease of native coronary artery without angina pectoris: Secondary | ICD-10-CM

## 2024-03-27 DIAGNOSIS — E1169 Type 2 diabetes mellitus with other specified complication: Secondary | ICD-10-CM | POA: Diagnosis not present

## 2024-03-27 DIAGNOSIS — F03918 Unspecified dementia, unspecified severity, with other behavioral disturbance: Secondary | ICD-10-CM | POA: Diagnosis present

## 2024-03-27 DIAGNOSIS — Z794 Long term (current) use of insulin: Secondary | ICD-10-CM | POA: Diagnosis not present

## 2024-03-27 DIAGNOSIS — G40909 Epilepsy, unspecified, not intractable, without status epilepticus: Secondary | ICD-10-CM | POA: Diagnosis present

## 2024-03-27 DIAGNOSIS — Z89611 Acquired absence of right leg above knee: Secondary | ICD-10-CM | POA: Diagnosis not present

## 2024-03-27 DIAGNOSIS — R1313 Dysphagia, pharyngeal phase: Secondary | ICD-10-CM | POA: Diagnosis present

## 2024-03-27 DIAGNOSIS — I252 Old myocardial infarction: Secondary | ICD-10-CM

## 2024-03-27 DIAGNOSIS — Z91148 Patient's other noncompliance with medication regimen for other reason: Secondary | ICD-10-CM

## 2024-03-27 DIAGNOSIS — Z931 Gastrostomy status: Secondary | ICD-10-CM

## 2024-03-27 DIAGNOSIS — R634 Abnormal weight loss: Secondary | ICD-10-CM | POA: Diagnosis not present

## 2024-03-27 DIAGNOSIS — R042 Hemoptysis: Secondary | ICD-10-CM | POA: Diagnosis present

## 2024-03-27 DIAGNOSIS — F17210 Nicotine dependence, cigarettes, uncomplicated: Secondary | ICD-10-CM | POA: Diagnosis present

## 2024-03-27 DIAGNOSIS — Z89612 Acquired absence of left leg above knee: Secondary | ICD-10-CM

## 2024-03-27 DIAGNOSIS — Z7189 Other specified counseling: Secondary | ICD-10-CM | POA: Diagnosis not present

## 2024-03-27 DIAGNOSIS — E1151 Type 2 diabetes mellitus with diabetic peripheral angiopathy without gangrene: Secondary | ICD-10-CM | POA: Diagnosis present

## 2024-03-27 DIAGNOSIS — Z930 Tracheostomy status: Secondary | ICD-10-CM

## 2024-03-27 DIAGNOSIS — Z79899 Other long term (current) drug therapy: Secondary | ICD-10-CM | POA: Diagnosis not present

## 2024-03-27 DIAGNOSIS — E785 Hyperlipidemia, unspecified: Secondary | ICD-10-CM | POA: Diagnosis present

## 2024-03-27 DIAGNOSIS — J9601 Acute respiratory failure with hypoxia: Secondary | ICD-10-CM | POA: Diagnosis present

## 2024-03-27 DIAGNOSIS — J9502 Infection of tracheostomy stoma: Secondary | ICD-10-CM | POA: Diagnosis not present

## 2024-03-27 DIAGNOSIS — C140 Malignant neoplasm of pharynx, unspecified: Principal | ICD-10-CM | POA: Diagnosis present

## 2024-03-27 DIAGNOSIS — R220 Localized swelling, mass and lump, head: Secondary | ICD-10-CM | POA: Diagnosis not present

## 2024-03-27 DIAGNOSIS — R6889 Other general symptoms and signs: Secondary | ICD-10-CM | POA: Diagnosis not present

## 2024-03-27 DIAGNOSIS — R1310 Dysphagia, unspecified: Secondary | ICD-10-CM

## 2024-03-27 DIAGNOSIS — R1110 Vomiting, unspecified: Secondary | ICD-10-CM | POA: Diagnosis present

## 2024-03-27 DIAGNOSIS — Z681 Body mass index (BMI) 19 or less, adult: Secondary | ICD-10-CM | POA: Diagnosis not present

## 2024-03-27 DIAGNOSIS — R64 Cachexia: Secondary | ICD-10-CM | POA: Diagnosis present

## 2024-03-27 DIAGNOSIS — I10 Essential (primary) hypertension: Secondary | ICD-10-CM

## 2024-03-27 DIAGNOSIS — I639 Cerebral infarction, unspecified: Secondary | ICD-10-CM | POA: Diagnosis not present

## 2024-03-27 DIAGNOSIS — R627 Adult failure to thrive: Secondary | ICD-10-CM | POA: Diagnosis present

## 2024-03-27 DIAGNOSIS — Z85818 Personal history of malignant neoplasm of other sites of lip, oral cavity, and pharynx: Secondary | ICD-10-CM

## 2024-03-27 DIAGNOSIS — D3705 Neoplasm of uncertain behavior of pharynx: Secondary | ICD-10-CM

## 2024-03-27 DIAGNOSIS — Z823 Family history of stroke: Secondary | ICD-10-CM

## 2024-03-27 DIAGNOSIS — Z7982 Long term (current) use of aspirin: Secondary | ICD-10-CM

## 2024-03-27 DIAGNOSIS — E43 Unspecified severe protein-calorie malnutrition: Secondary | ICD-10-CM | POA: Diagnosis present

## 2024-03-27 DIAGNOSIS — L89156 Pressure-induced deep tissue damage of sacral region: Secondary | ICD-10-CM | POA: Diagnosis present

## 2024-03-27 DIAGNOSIS — K1379 Other lesions of oral mucosa: Secondary | ICD-10-CM | POA: Diagnosis not present

## 2024-03-27 DIAGNOSIS — Z66 Do not resuscitate: Secondary | ICD-10-CM | POA: Diagnosis present

## 2024-03-27 DIAGNOSIS — E119 Type 2 diabetes mellitus without complications: Secondary | ICD-10-CM

## 2024-03-27 DIAGNOSIS — Z8673 Personal history of transient ischemic attack (TIA), and cerebral infarction without residual deficits: Secondary | ICD-10-CM

## 2024-03-27 DIAGNOSIS — T884XXA Failed or difficult intubation, initial encounter: Secondary | ICD-10-CM | POA: Diagnosis present

## 2024-03-27 DIAGNOSIS — Z7984 Long term (current) use of oral hypoglycemic drugs: Secondary | ICD-10-CM

## 2024-03-27 DIAGNOSIS — N182 Chronic kidney disease, stage 2 (mild): Secondary | ICD-10-CM | POA: Diagnosis present

## 2024-03-27 DIAGNOSIS — E46 Unspecified protein-calorie malnutrition: Secondary | ICD-10-CM | POA: Diagnosis not present

## 2024-03-27 DIAGNOSIS — D490 Neoplasm of unspecified behavior of digestive system: Secondary | ICD-10-CM

## 2024-03-27 HISTORY — DX: Type 2 diabetes mellitus without complications: E11.9

## 2024-03-27 HISTORY — DX: Chronic kidney disease, stage 2 (mild): N18.2

## 2024-03-27 HISTORY — PX: TRACHEOSTOMY TUBE PLACEMENT: SHX814

## 2024-03-27 HISTORY — DX: Acquired absence of right leg above knee: Z89.611

## 2024-03-27 HISTORY — PX: MICROLARYNGOSCOPY WITH LASER: SHX5972

## 2024-03-27 HISTORY — DX: Tobacco use: Z72.0

## 2024-03-27 LAB — TYPE AND SCREEN
ABO/RH(D): A POS
Antibody Screen: NEGATIVE

## 2024-03-27 LAB — DIC (DISSEMINATED INTRAVASCULAR COAGULATION)PANEL
D-Dimer, Quant: 0.55 ug{FEU}/mL — ABNORMAL HIGH (ref 0.00–0.50)
Fibrinogen: 483 mg/dL — ABNORMAL HIGH (ref 210–475)
INR: 1.1 (ref 0.8–1.2)
Platelets: 433 K/uL — ABNORMAL HIGH (ref 150–400)
Prothrombin Time: 14.9 s (ref 11.4–15.2)
Smear Review: NONE SEEN
aPTT: 35 s (ref 24–36)

## 2024-03-27 LAB — GLUCOSE, CAPILLARY
Glucose-Capillary: 97 mg/dL (ref 70–99)
Glucose-Capillary: 93 mg/dL (ref 70–99)
Glucose-Capillary: 89 mg/dL (ref 70–99)
Glucose-Capillary: 108 mg/dL — ABNORMAL HIGH (ref 70–99)
Glucose-Capillary: 104 mg/dL — ABNORMAL HIGH (ref 70–99)

## 2024-03-27 LAB — CBC
HCT: 33.2 % — ABNORMAL LOW (ref 39.0–52.0)
Hemoglobin: 10.8 g/dL — ABNORMAL LOW (ref 13.0–17.0)
MCH: 30.8 pg (ref 26.0–34.0)
MCHC: 32.5 g/dL (ref 30.0–36.0)
MCV: 94.6 fL (ref 80.0–100.0)
Platelets: 408 K/uL — ABNORMAL HIGH (ref 150–400)
RBC: 3.51 MIL/uL — ABNORMAL LOW (ref 4.22–5.81)
RDW: 13.4 % (ref 11.5–15.5)
WBC: 7.9 K/uL (ref 4.0–10.5)
nRBC: 0 % (ref 0.0–0.2)

## 2024-03-27 LAB — HEMOGLOBIN A1C
Hgb A1c MFr Bld: 5.9 % — ABNORMAL HIGH (ref 4.8–5.6)
Mean Plasma Glucose: 122.63 mg/dL

## 2024-03-27 MED ORDER — 0.9 % SODIUM CHLORIDE (POUR BTL) OPTIME
TOPICAL | Status: DC | PRN
  Administered 2024-03-27: 1000 mL

## 2024-03-27 MED ORDER — OXYMETAZOLINE HCL 0.05 % NA SOLN
NASAL | Status: DC | PRN
  Administered 2024-03-27 (×2): 1 via TOPICAL

## 2024-03-27 MED ORDER — HYDROMORPHONE HCL 1 MG/ML IJ SOLN
INTRAMUSCULAR | Status: AC
  Filled 2024-03-27: qty 1

## 2024-03-27 MED ORDER — ROCURONIUM BROMIDE 10 MG/ML (PF) SYRINGE
PREFILLED_SYRINGE | INTRAVENOUS | Status: DC | PRN
  Administered 2024-03-27 (×3): 10 mg via INTRAVENOUS

## 2024-03-27 MED ORDER — ORAL CARE MOUTH RINSE: 15.0000 mL | OROMUCOSAL | Status: DC | PRN

## 2024-03-27 MED ORDER — SODIUM CHLORIDE 0.9 % IV SOLN: 12.5000 mg | INTRAVENOUS | Status: DC | PRN

## 2024-03-27 MED ORDER — OXYCODONE HCL 5 MG/5ML PO SOLN: 5.0000 mg | Freq: Once | ORAL | Status: DC | PRN

## 2024-03-27 MED ORDER — HYDROMORPHONE HCL 1 MG/ML IJ SOLN
0.5000 mg | INTRAMUSCULAR | Status: DC | PRN
  Administered 2024-03-27 – 2024-04-18 (×18): 1 mg via INTRAVENOUS
  Filled 2024-03-27 (×19): qty 1

## 2024-03-27 MED ORDER — EPINEPHRINE HCL (NASAL) 0.1 % NA SOLN
NASAL | Status: DC | PRN
  Administered 2024-03-27: 20 mL via TOPICAL

## 2024-03-27 MED ORDER — ALBUMIN HUMAN 5 % IV SOLN
INTRAVENOUS | Status: AC
  Filled 2024-03-27: qty 250

## 2024-03-27 MED ORDER — DOCUSATE SODIUM 50 MG/5ML PO LIQD
100.0000 mg | Freq: Two times a day (BID) | ORAL | Status: DC
  Administered 2024-03-30 – 2024-04-27 (×31): 100 mg
  Filled 2024-03-27 (×47): qty 10

## 2024-03-27 MED ORDER — INSULIN ASPART 100 UNIT/ML IJ SOLN
0.0000 [IU] | INTRAMUSCULAR | Status: DC
  Administered 2024-04-01 – 2024-04-03 (×6): 1 [IU] via SUBCUTANEOUS

## 2024-03-27 MED ORDER — ESMOLOL HCL 100 MG/10ML IV SOLN
INTRAVENOUS | Status: AC
  Filled 2024-03-27: qty 10

## 2024-03-27 MED ORDER — ORAL CARE MOUTH RINSE: 15.0000 mL | Freq: Once | OROMUCOSAL | Status: AC

## 2024-03-27 MED ORDER — FENTANYL CITRATE (PF) 250 MCG/5ML IJ SOLN
INTRAMUSCULAR | Status: DC | PRN
  Administered 2024-03-27 (×2): 50 ug via INTRAVENOUS

## 2024-03-27 MED ORDER — PHENYLEPHRINE 80 MCG/ML (10ML) SYRINGE FOR IV PUSH (FOR BLOOD PRESSURE SUPPORT)
PREFILLED_SYRINGE | INTRAVENOUS | Status: DC | PRN
  Administered 2024-03-27: 160 ug via INTRAVENOUS

## 2024-03-27 MED ORDER — ORAL CARE MOUTH RINSE
15.0000 mL | OROMUCOSAL | Status: DC
  Administered 2024-03-28 (×11): 15 mL via OROMUCOSAL

## 2024-03-27 MED ORDER — EPINEPHRINE HCL (NASAL) 0.1 % NA SOLN
NASAL | Status: AC
  Filled 2024-03-27: qty 60

## 2024-03-27 MED ORDER — AMISULPRIDE (ANTIEMETIC) 5 MG/2ML IV SOLN: 10.0000 mg | Freq: Once | INTRAVENOUS | Status: DC | PRN

## 2024-03-27 MED ORDER — ONDANSETRON HCL 4 MG/2ML IJ SOLN
INTRAMUSCULAR | Status: DC | PRN
  Administered 2024-03-27: 4 mg via INTRAVENOUS

## 2024-03-27 MED ORDER — LEVETIRACETAM (KEPPRA) 500 MG/5 ML ADULT IV PUSH
500.0000 mg | Freq: Two times a day (BID) | INTRAVENOUS | Status: DC
  Administered 2024-03-27 – 2024-04-03 (×14): 500 mg via INTRAVENOUS
  Filled 2024-03-27 (×14): qty 5

## 2024-03-27 MED ORDER — PHENYLEPHRINE 80 MCG/ML (10ML) SYRINGE FOR IV PUSH (FOR BLOOD PRESSURE SUPPORT)
PREFILLED_SYRINGE | INTRAVENOUS | Status: AC
  Filled 2024-03-27: qty 10

## 2024-03-27 MED ORDER — PROPOFOL 1000 MG/100ML IV EMUL
INTRAVENOUS | Status: AC
  Filled 2024-03-27: qty 100

## 2024-03-27 MED ORDER — PROPOFOL 1000 MG/100ML IV EMUL
0.0000 ug/kg/min | INTRAVENOUS | Status: DC
  Administered 2024-03-27: 65 ug/kg/min via INTRAVENOUS
  Administered 2024-03-27: 75 ug/kg/min via INTRAVENOUS
  Administered 2024-03-27: 55 ug/kg/min via INTRAVENOUS

## 2024-03-27 MED ORDER — LACTATED RINGERS IV SOLN: INTRAVENOUS | Status: DC

## 2024-03-27 MED ORDER — HYDROMORPHONE HCL 1 MG/ML IJ SOLN
0.2500 mg | INTRAMUSCULAR | Status: DC | PRN
  Administered 2024-03-27: 0.5 mg via INTRAVENOUS

## 2024-03-27 MED ORDER — ALBUMIN HUMAN 5 % IV SOLN
12.5000 g | Freq: Once | INTRAVENOUS | Status: AC
  Administered 2024-03-27: 12.5 g via INTRAVENOUS

## 2024-03-27 MED ORDER — POLYETHYLENE GLYCOL 3350 17 G PO PACK
17.0000 g | PACK | Freq: Every day | ORAL | Status: DC
  Administered 2024-03-31 – 2024-04-21 (×13): 17 g
  Filled 2024-03-27 (×18): qty 1

## 2024-03-27 MED ORDER — HYDROMORPHONE HCL 1 MG/ML IJ SOLN
INTRAMUSCULAR | Status: AC
  Administered 2024-03-27: 0.5 mg via INTRAVENOUS
  Filled 2024-03-27: qty 1

## 2024-03-27 MED ORDER — DEXAMETHASONE SODIUM PHOSPHATE 10 MG/ML IJ SOLN
INTRAMUSCULAR | Status: DC | PRN
  Administered 2024-03-27: 10 mg via INTRAVENOUS

## 2024-03-27 MED ORDER — HYDROMORPHONE HCL 1 MG/ML IJ SOLN: 0.5000 mg | INTRAMUSCULAR | Status: AC

## 2024-03-27 MED ORDER — INSULIN ASPART 100 UNIT/ML IJ SOLN: 0.0000 [IU] | INTRAMUSCULAR | Status: DC | PRN

## 2024-03-27 MED ORDER — MIDAZOLAM HCL 2 MG/2ML IJ SOLN
1.0000 mg | INTRAMUSCULAR | Status: DC | PRN
  Administered 2024-03-27 – 2024-03-28 (×4): 2 mg via INTRAVENOUS
  Filled 2024-03-27 (×4): qty 2

## 2024-03-27 MED ORDER — CHLORHEXIDINE GLUCONATE 0.12 % MT SOLN
15.0000 mL | Freq: Once | OROMUCOSAL | Status: AC
  Administered 2024-03-27: 15 mL via OROMUCOSAL
  Filled 2024-03-27: qty 15

## 2024-03-27 MED ORDER — LIDOCAINE 2% (20 MG/ML) 5 ML SYRINGE
INTRAMUSCULAR | Status: DC | PRN
  Administered 2024-03-27: 60 mg via INTRAVENOUS

## 2024-03-27 MED ORDER — FAMOTIDINE 20 MG PO TABS
20.0000 mg | ORAL_TABLET | Freq: Two times a day (BID) | ORAL | Status: DC
  Administered 2024-03-30 – 2024-04-27 (×39): 20 mg
  Filled 2024-03-27 (×46): qty 1

## 2024-03-27 MED ORDER — LIDOCAINE 2% (20 MG/ML) 5 ML SYRINGE
INTRAMUSCULAR | Status: AC
  Filled 2024-03-27: qty 5

## 2024-03-27 MED ORDER — HEMOSTATIC AGENTS (NO CHARGE) OPTIME
TOPICAL | Status: DC | PRN
  Administered 2024-03-27: 1 via TOPICAL

## 2024-03-27 MED ORDER — OXYMETAZOLINE HCL 0.05 % NA SOLN
NASAL | Status: AC
  Filled 2024-03-27: qty 30

## 2024-03-27 MED ORDER — FENTANYL CITRATE (PF) 250 MCG/5ML IJ SOLN
INTRAMUSCULAR | Status: AC
  Filled 2024-03-27: qty 5

## 2024-03-27 MED ORDER — PROPOFOL 10 MG/ML IV BOLUS
INTRAVENOUS | Status: DC | PRN
  Administered 2024-03-27: 110 mg via INTRAVENOUS
  Administered 2024-03-27: 50 ug/kg/min via INTRAVENOUS

## 2024-03-27 MED ORDER — DEXMEDETOMIDINE HCL IN NACL 400 MCG/100ML IV SOLN
0.0000 ug/kg/h | INTRAVENOUS | Status: DC
  Administered 2024-03-27: 0.4 ug/kg/h via INTRAVENOUS
  Filled 2024-03-27: qty 100

## 2024-03-27 MED ORDER — PROPOFOL 10 MG/ML IV BOLUS
INTRAVENOUS | Status: AC
  Filled 2024-03-27: qty 20

## 2024-03-27 MED ORDER — CHLORHEXIDINE GLUCONATE CLOTH 2 % EX PADS
6.0000 | MEDICATED_PAD | Freq: Every day | CUTANEOUS | Status: DC
  Administered 2024-03-27 – 2024-03-30 (×4): 6 via TOPICAL

## 2024-03-27 MED ORDER — ROCURONIUM BROMIDE 10 MG/ML (PF) SYRINGE
PREFILLED_SYRINGE | INTRAVENOUS | Status: AC
Start: 2024-03-27 — End: 2024-03-27
  Filled 2024-03-27: qty 10

## 2024-03-27 MED ORDER — OXYCODONE HCL 5 MG PO TABS: 5.0000 mg | ORAL_TABLET | Freq: Once | ORAL | Status: DC | PRN

## 2024-03-27 MED ORDER — ESMOLOL HCL 100 MG/10ML IV SOLN
INTRAVENOUS | Status: DC | PRN
  Administered 2024-03-27 (×3): 10 mg via INTRAVENOUS
  Administered 2024-03-27: 20 mg via INTRAVENOUS

## 2024-03-27 MED ORDER — MIDAZOLAM HCL 2 MG/2ML IJ SOLN
INTRAMUSCULAR | Status: AC
  Filled 2024-03-27: qty 2

## 2024-03-27 MED ORDER — SUCCINYLCHOLINE CHLORIDE 200 MG/10ML IV SOSY
PREFILLED_SYRINGE | INTRAVENOUS | Status: DC | PRN
Start: 2024-03-27 — End: 2024-03-27
  Administered 2024-03-27: 20 mg via INTRAVENOUS
  Administered 2024-03-27: 100 mg via INTRAVENOUS

## 2024-03-27 MED ORDER — LIDOCAINE-EPINEPHRINE 1 %-1:100000 IJ SOLN
INTRAMUSCULAR | Status: AC
  Filled 2024-03-27: qty 1

## 2024-03-27 SURGICAL SUPPLY — 5 items
KIT PROLARN PLUS GEL W/NDL (Prosthesis and Implant ENT) IMPLANT
NDL HYPO 25GX1X1/2 BEV (NEEDLE) IMPLANT
NDL TRANS ORAL INJECTION (NEEDLE) IMPLANT
SOLN 0.9% NACL 1000 ML (IV SOLUTION) ×1 IMPLANT
SOLN STERILE WATER 1000 ML (IV SOLUTION) ×1 IMPLANT

## 2024-03-27 NOTE — Progress Notes (Signed)
 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 S/p tracheostomy to avoid re-intubation in the setting of active bleeding due to hemoptysis preop and friable tissue/bleeding during biopsy.   - 6-0 cuffed Shiley in place, ok to deflate the cuff tomorrow POD#1 - keep cuffed trach in place in case there is recurrent oral bleeding and the cuff can be re-inflated to protect the airway - preop dysphagia - needs to get speech evaluation and if needed feeding tube placement  - when out of ICU will require Hospitalist/Medicine as primary to manage multiple comorbidities - due to diffuse nature of the tumor needs CT Angio of the neck to rule out tumor invasion of carotid arteries and other major vessels  Elena Larry, MD

## 2024-03-27 NOTE — Consult Note (Signed)
 NAME:  Stephen Bowen, MRN:  [id number], DOB:  April 08, 1956, LOS: 0 ADMISSION DATE:  03/27/2024, CONSULTATION DATE:  03/27/24 REFERRING MD:  okey, CHIEF COMPLAINT:  pod0 trach    History of Present Illness:  68 yo M who presented 10/3 for biopsy of pharyngeal mass and tracheostomy placement. Leading up to case has been having difficulty swallowing and some hemoptysis  Frozen section during case is positive for SCC   To be admitted to ICU post op   PCCM consulted in this setting   Pertinent  Medical History  CAD Dementia DM HTN HLD PAD  Significant Hospital Events: Including procedures, antibiotic start and stop dates in addition to other pertinent events   10/3 largyngoscopy, biopsy, tracheostomy  Interim History / Subjective:  Seen in PACU  Objective    Blood pressure (!) 133/100, pulse (!) 103, temperature 98.5 F (36.9 C), temperature source Oral, resp. rate 16, height 5' 6 (1.676 m), weight 51.1 kg, SpO2 96%.    Vent Mode: PRVC FiO2 (%):  [40 %] 40 % Set Rate:  [16 bmp] 16 bmp Vt Set:  [510 mL] 510 mL PEEP:  [5 cmH20] 5 cmH20   Intake/Output Summary (Last 24 hours) at 03/27/2024 1434 Last data filed at 03/27/2024 1355 Gross per 24 hour  Intake --  Output 5 ml  Net -5 ml   Filed Weights   03/27/24 1016  Weight: 51.1 kg    Examination: General: chronically ill HENT: trach in place, not much stomal blood, minimal blood in circuit Lungs: diminished, passive on vent Cardiovascular: regular, no murmur Abdomen: soft, hypoactive BS Extremities: missing both LE, stump on L looks okay, R has some ?scabbing Neuro: still sedated and possibly paralyzed Skin: no rashes  Resolved problem list   Assessment and Plan   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  Basic Metabolic Panel: No results for input(s): NA, K, CL, CO2, GLUCOSE, BUN, CREATININE, CALCIUM , MG, PHOS in the last 168 hours. GFR: CrCl cannot be calculated (Patient's most recent lab result is older than the maximum 21 days allowed.). No results for input(s): PROCALCITON, WBC, LATICACIDVEN in the last 168 hours.  Liver Function Tests: No results for input(s): AST, ALT, ALKPHOS, BILITOT, PROT, ALBUMIN  in the last 168 hours. No results for input(s): LIPASE, AMYLASE in the last 168 hours. No results for input(s): AMMONIA in the last 168 hours.  ABG    Component Value Date/Time   PHART 7.439 01/09/2018 1650   PCO2ART 32.8 01/09/2018 1650   PO2ART 355.0 (H) 01/09/2018 1650   HCO3 22.1 01/09/2018 1650   TCO2 24 02/09/2021 0815   ACIDBASEDEF 1.0 01/09/2018 1650   O2SAT 100.0 01/09/2018 1650     Coagulation Profile: No results for input(s): INR, PROTIME in the last 168 hours.  Cardiac Enzymes: No results for input(s): CKTOTAL, CKMB, CKMBINDEX, TROPONINI in the last 168 hours.  HbA1C: Hgb A1c MFr Bld  Date/Time Value Ref Range Status  01/09/2018 10:19 AM 6.0 (H) 4.8 - 5.6 % Final    Comment:    (NOTE) Pre diabetes:  5.7%-6.4% Diabetes:              >6.4% Glycemic control for   <7.0% adults with diabetes   02/14/2016 03:21 AM 5.5 4.8 - 5.6 % Final    Comment:    (NOTE)         Pre-diabetes: 5.7 - 6.4         Diabetes: >6.4         Glycemic control for adults with diabetes: <7.0     CBG: Recent Labs  Lab 03/27/24 1032  GLUCAP 97    Review of Systems:   Sedated on vent  Past Medical History:  He,  has a past medical history of Coronary artery  disease, Dementia (HCC), Diabetes mellitus without complication (HCC), Encephalopathy acute (07/2016), HTN (hypertension), Hyperlipemia, Myocardial infarction (HCC), Non-healing surgical wound, Noncompliance, and PAD (peripheral artery disease).   Surgical History:   Past Surgical History:  Procedure Laterality Date   ABDOMINAL AORTOGRAM W/LOWER EXTREMITY N/A 08/02/2016   Procedure: Abdominal Aortogram w/Lower Extremity;  Surgeon: Penne Lonni Colorado, MD;  Location: [facility] INVASIVE CV LAB;  Service: Cardiovascular;  Laterality: N/A;   ABOVE KNEE LEG AMPUTATION Right 12/04/2016   AMPUTATION Left 06/25/2016   Procedure: AMPUTATION ABOVE KNEE;  Surgeon: Gaile LELON New, MD;  Location: [facility] OR;  Service: Vascular;  Laterality: Left;   AMPUTATION Right 12/04/2016   Procedure: RIGHT AMPUTATION ABOVE KNEE;  Surgeon: Colorado Penne Lonni, MD;  Location: [facility] OR;  Service: Vascular;  Laterality: Right;   AMPUTATION Left 10/08/2017   Procedure: LEFT ABOVE KNEE AMPUTATION REVISION;  Surgeon: Colorado Penne Lonni, MD;  Location: [facility] OR;  Service: Vascular;  Laterality: Left;   APPLICATION OF WOUND VAC Left 02/09/2021   Procedure: APPLICATION OF WOUND VAC;  Surgeon: Colorado Penne Lonni, MD;  Location: [facility] OR;  Service: Vascular;  Laterality: Left;   BYPASS GRAFT POPLITEAL TO TIBIAL Left 06/21/2016   Procedure: BYPASS GRAFT POPLITEAL TO TIBIAL USING GORE PROPATEN GRAFT;  Surgeon: Penne Lonni Colorado, MD;  Location: [facility] OR;  Service: Vascular;  Laterality: Left;   EMBOLECTOMY Left 06/21/2016   Procedure: EMBOLECTOMY left lower extremity.;  Surgeon: Penne Lonni Colorado, MD;  Location: [facility] OR;  Service: Vascular;  Laterality: Left;   ENDARTERECTOMY FEMORAL Left 02/16/2016   Procedure: LEFT FEMORAL ENDARTERECTOMY;  Surgeon: Penne Lonni Colorado, MD;  Location: [facility] OR;  Service: Vascular;  Laterality: Left;   FEMORAL BYPASS  02/16/2016   LEFT FEMORAL-POSTERIOR TIBIAL ARTERY BYPASS  WITH PROPATEN 6  MM X 80 CM VASCULAR RING GRAFT (Left)   FEMORAL-TIBIAL BYPASS GRAFT Left 02/16/2016   Procedure: LEFT FEMORAL-POSTERIOR TIBIAL ARTERY BYPASS  WITH PROPATEN 6 MM X 80 CM VASCULAR RING GRAFT;  Surgeon: Penne Lonni Colorado, MD;  Location: [facility] OR;  Service: Vascular;  Laterality: Left;   FRACTURE SURGERY     I & D EXTREMITY Left 09/03/2016   Procedure: IRRIGATION AND DEBRIDEMENT EXTREMITY - LEFT AKA;  Surgeon: Penne Lonni Colorado, MD;  Location: [facility] OR;  Service: Vascular;  Laterality: Left;   I & D EXTREMITY Left 01/09/2018   Procedure: IRRIGATION AND DEBRIDEMENT GROIN;  Surgeon: [place] Penne Lonni, MD;  Location: [facility] OR;  Service: Vascular;  Laterality: Left;   INCISION AND DRAINAGE Left 02/09/2021   Procedure: INCISION AND DRAINAGE OF LEFT GROIN WOUND;  Surgeon: Colorado Penne Lonni, MD;  Location: [facility] OR;  Service: Vascular;  Laterality: Left;   PATCH ANGIOPLASTY Left 02/16/2016   Procedure: PATCH ANGIOPLASTY WITH XENOSURE BIOLOGIC PATCH 1 CM X  6 CM;  Surgeon: Penne Lonni Colorado, MD;  Location: [facility] OR;  Service: Vascular;  Laterality: Left;   PATCH ANGIOPLASTY Left 01/09/2018   Procedure: PATCH ANGIOPLASTY OF LEFT COMMON FEMORAL AND PROFUNDA  USING SAPHENOUS VEIN;  Surgeon: [place] Penne Lonni, MD;  Location: [facility] OR;  Service: Vascular;  Laterality: Left;   PERIPHERAL VASCULAR BALLOON ANGIOPLASTY Right 08/02/2016   Procedure: Peripheral Vascular Balloon Angioplasty;  Surgeon: Penne Lonni Colorado, MD;  Location: [facility] INVASIVE CV LAB;  Service: Cardiovascular;  Laterality: Right;  peroneal artery   PERIPHERAL VASCULAR CATHETERIZATION N/A 07/07/2015   Procedure: Abdominal Aortogram w/Lower Extremity;  Surgeon: Redell LITTIE Door, MD;  Location: [facility] INVASIVE CV LAB;  Service: Cardiovascular;  Laterality: N/A;   PERIPHERAL VASCULAR CATHETERIZATION N/A 02/15/2016   Procedure: Abdominal Aortogram w/Lower Extremity;  Surgeon: Penne Lonni Colorado, MD;  Location: [facility] INVASIVE CV LAB;   Service: Cardiovascular;  Laterality: N/A;   PERIPHERAL VASCULAR CATHETERIZATION Bilateral 06/20/2016   Procedure: Lower Extremity Angiography;  Surgeon: Penne Lonni Colorado, MD;  Location: [facility] INVASIVE CV LAB;  Service: Cardiovascular;  Laterality: Bilateral;  limited runoff on rt leg thrombolysis lt leg bypass graft   PERIPHERAL VASCULAR INTERVENTION Right 08/02/2016   Procedure: Peripheral Vascular Intervention;  Surgeon: Penne Lonni Colorado, MD;  Location: [facility] INVASIVE CV LAB;  Service: Cardiovascular;  Laterality: Right;  Femoral , popliteal   TIBIA FRACTURE SURGERY Left 2000   per patient, he has rod inside his left leg.   VEIN HARVEST Left 01/09/2018   Procedure: LEFT SAPHENOUS VEIN HARVEST;  Surgeon: [place] Penne Lonni, MD;  Location: [facility] OR;  Service: Vascular;  Laterality: Left;     Social History:   reports that he has been smoking cigarettes. He has a 120 pack-year smoking history. He has never used smokeless tobacco. He reports that he does not currently use alcohol after a past usage of about 14.0 standard drinks of alcohol per week. He reports that he does not use drugs.   Family History:  His family history includes Cancer in his mother; Heart attack in his mother; Heart disease in his brother and father; Stroke in his sister.   Allergies No Known Allergies   Home Medications  Prior to Admission medications   Medication Sig Start Date End Date Taking? Authorizing Provider  aspirin  EC 81 MG EC tablet Take 1 tablet (81 mg total) by mouth daily. 07/07/16  Yes Rhyne, Samantha J, PA-C  atorvastatin  (LIPITOR ) 40 MG tablet Take 1 tablet (40 mg total) by mouth daily at 6 PM. 02/18/16  Yes Zella, Mir M, MD  bisacodyl  (DULCOLAX) 10 MG suppository Place 10 mg rectally as needed for moderate constipation.   Yes [provider]  docusate sodium  (COLACE) 100 MG capsule Take 1 capsule (100 mg total) by mouth daily. Patient taking differently: Take 100 mg by mouth  daily as needed for moderate constipation. 02/18/16  Yes Zella, Mir M, MD  gabapentin  (NEURONTIN ) 100 MG capsule Take 100 mg by mouth 3 (three) times daily. 03/07/20  Yes [provider]  levETIRAcetam (KEPPRA) 100 MG/ML solution Take 500 mg by mouth 2 (two) times daily.   Yes [provider]  lidocaine  (XYLOCAINE ) 2 % solution Use as directed 5 mLs in the mouth or throat 3 (three) times daily.   Yes [provider]  lisinopril  (ZESTRIL ) 5 MG tablet Take 5 mg by mouth daily. 02/15/24  Yes [provider]  metFORMIN (GLUCOPHAGE) 500 MG tablet Take 500 mg by mouth daily. 02/03/24  Yes [provider]  metoprolol  succinate (TOPROL -XL) 25 MG 24 hr tablet Take 25 mg by mouth daily. 02/15/24  Yes [provider]  Metoprolol  Tartrate 37.5 MG TABS Take 1 tablet by mouth 2 (two) times daily. 03/20/24  Yes [provider]  phenol (CHLORASEPTIC) 1.4 % LIQD Use as directed 2 sprays in the mouth or throat every 4 (four) hours as needed for throat irritation / pain.   Yes [provider]  fenofibrate micronized (LOFIBRA) 67 MG capsule Take 67 mg by mouth daily. Patient not taking: Reported on 03/25/2024 02/24/24   [provider]  levETIRAcetam (KEPPRA) 500 MG tablet Take 500 mg by mouth 2 (two) times daily. Patient not taking: Reported on 03/25/2024    [provider]  metoprolol  succinate (TOPROL -XL) 50 MG 24 hr tablet Take 50 mg by mouth daily. Patient not taking: Reported on 03/25/2024 02/24/24   [provider]     Critical care time: 31 mins

## 2024-03-27 NOTE — Op Note (Signed)
 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  Physical exam revealed pharyngeal mass concerning for neoplasm, and the patient was advised to have direct laryngoscopy and biopsy for tissue diagnosis. Risks and benefits of surgery were discussed with the patient including bleeding, anesthesia risk, and other risks and he elected to proceed.   Operative Procedure:  The patient was correctly identified and brought to the operating room and placed in supine position. An endotracheal tube was placed without difficulty and the head of bed rotated 90 degrees with respect to anestheisa. A Lindholm laryngoscope was used to examine the pharynx and oropharynx. Entire posterior pharyngeal wall showed evidence of exophytic mass extending along entire width of pharyngeal wall from soft palate to post-cricoid area, with left tonsillar tissue involvement. We proceeded to take multiple biopsy samples, and several samples were sent for frozen section and permanent histopathology. We then proceeded with hemostasis, which was achieved using a combination of cautery with suction Bovie, Afrin soaked pledgets and Epi soaked  gauze, but there was continuous slow volume oozing. We applied Arista as well.  Dye to risk or recurrent bleeding after extubation and more difficult intubation in the setting of active bleeding, a decision was made to proceed with tracheostomy after obtaining consent from the patient's power of attorney, his sister Sari Poplin who was available on the phone.   The anterior neck was prepped and draped in sterile fashion. The incision was made using a 15 blade scalpel.  The strap muscles were retracted and there was no evidence of thyroid tissue.   Soft tissues were swept from the trachea.  The space between the second and third tracheal rings was injected with local anesthetic.  A horizontal incision was made in this space with the scalpel and extended to either side with scissors.  A 2-0 silk stay suture was placed around the ring above and below the trach site. A bjork flap was created and sutured to the inferior stoma. A 6-0 cuffed Shiley trach was placed and the inner cannula was inserted.  The anesthesia circuit was attached and the patient was successfully ventilated.  The trach flange was secured to the neck skin using 2-0 silk in four quadrants.  The patient was cleaned off and a trach dressing and trach tie were added.  The patient was then returned to anesthesia and moved to the intensive care unit in stable condition.  Elena Larry, MD

## 2024-03-27 NOTE — Progress Notes (Signed)
 Called for report from [facility]. Per the nurse giving medications this morning, the patient had a teaspoon of applesauce with his medications this morning. I will notify Dr. Cleotilde.

## 2024-03-27 NOTE — Anesthesia Postprocedure Evaluation (Signed)
 Anesthesia Post Note  Patient: Stephen Bowen  Procedure(s) Performed: DIRECT MICROLARYNGOSCOPY  AND BIOPSY (Throat) CREATION, TRACHEOSTOMY (Throat)     Patient location during evaluation: PACU Anesthesia Type: General Level of consciousness: awake and alert Pain management: pain level controlled Vital Signs Assessment: post-procedure vital signs reviewed and stable Respiratory status: spontaneous breathing, nonlabored ventilation, respiratory function stable and patient connected to nasal cannula oxygen Cardiovascular status: blood pressure returned to baseline and stable Postop Assessment: no apparent nausea or vomiting Anesthetic complications: no   No notable events documented.  Last Vitals:  Vitals:   03/27/24 1530 03/27/24 1550  BP: (!) 82/60 98/65  Pulse: 86   Resp: 16 18  Temp:    SpO2: 96% 100%    Last Pain:  Vitals:   03/27/24 1037  TempSrc:   PainSc: 0-No pain   Pain Goal:                   Jarrin Staley

## 2024-03-27 NOTE — Progress Notes (Addendum)
 Primary nurse Young) at [facility] nursing facility updated on patient status via telephone.

## 2024-03-27 NOTE — Progress Notes (Signed)
 Per Dr. Cleotilde, continue with surgery at 1200. Received report from Harlene, Charity fundraiser at [facility]. Phone number is [phone].

## 2024-03-27 NOTE — Transfer of Care (Signed)
 Immediate Anesthesia Transfer of Care Note  Patient: Stephen Bowen  Procedure(s) Performed: DIRECT MICROLARYNGOSCOPY  AND BIOPSY (Throat) CREATION, TRACHEOSTOMY (Throat)  Patient Location: PACU  Anesthesia Type:General  Level of Consciousness: sedated  Airway & Oxygen Therapy: Patient remains intubated per anesthesia plan and Patient placed on Ventilator (see vital sign flow sheet for setting)  Post-op Assessment: Report given to RN and Post -op Vital signs reviewed and stable  Post vital signs: Reviewed and stable  Last Vitals:  Vitals Value Taken Time  BP    Temp    Pulse    Resp    SpO2 96 % 03/27/24 14:26    Last Pain:  Vitals:   03/27/24 1037  TempSrc:   PainSc: 0-No pain         Complications: No notable events documented.

## 2024-03-27 NOTE — Progress Notes (Signed)
 Patient transported from PACU to [phone] with RN and RT x2 on 100% fi02 while on the ventilator. No complications noted.

## 2024-03-27 NOTE — Anesthesia Procedure Notes (Signed)
 Procedure Name: Intubation Date/Time: 03/27/2024 12:59 PM  Performed by: Julien Manus, CRNAPre-anesthesia Checklist: Patient identified, Emergency Drugs available, Suction available and Patient being monitored Patient Re-evaluated:Patient Re-evaluated prior to induction Oxygen Delivery Method: Circle System Utilized Preoxygenation: Pre-oxygenation with 100% oxygen Induction Type: IV induction Ventilation: Mask ventilation without difficulty Laryngoscope Size: Glidescope and 3 Grade View: Grade I Tube type: Oral Tube size: 6.5 mm Number of attempts: 1 Airway Equipment and Method: Stylet and Oral airway Placement Confirmation: ETT inserted through vocal cords under direct vision, positive ETCO2 and breath sounds checked- equal and bilateral Secured at: 21 cm Tube secured with: Tape Dental Injury: Teeth and Oropharynx as per pre-operative assessment

## 2024-03-27 NOTE — Plan of Care (Signed)
  Problem: Education: Goal: Knowledge of General Education information will improve Description: Including pain rating scale, medication(s)/side effects and non-pharmacologic comfort measures Outcome: Progressing   Problem: Health Behavior/Discharge Planning: Goal: Ability to manage health-related needs will improve Outcome: Progressing   Problem: Clinical Measurements: Goal: Ability to maintain clinical measurements within normal limits will improve Outcome: Progressing Goal: Will remain free from infection Outcome: Progressing Goal: Diagnostic test results will improve Outcome: Progressing Goal: Respiratory complications will improve Outcome: Progressing Goal: Cardiovascular complication will be avoided Outcome: Progressing   Problem: Activity: Goal: Risk for activity intolerance will decrease Outcome: Progressing   Problem: Nutrition: Goal: Adequate nutrition will be maintained Outcome: Progressing   Problem: Coping: Goal: Level of anxiety will decrease Outcome: Progressing   Problem: Elimination: Goal: Will not experience complications related to bowel motility Outcome: Progressing Goal: Will not experience complications related to urinary retention Outcome: Progressing   Problem: Pain Managment: Goal: General experience of comfort will improve and/or be controlled Outcome: Progressing   Problem: Safety: Goal: Ability to remain free from injury will improve Outcome: Progressing   Problem: Skin Integrity: Goal: Risk for impaired skin integrity will decrease Outcome: Progressing   Problem: Education: Goal: Knowledge about tracheostomy care/management will improve Outcome: Progressing   Problem: Activity: Goal: Ability to tolerate increased activity will improve Outcome: Progressing   Problem: Health Behavior/Discharge Planning: Goal: Ability to manage tracheostomy will improve Outcome: Progressing   Problem: Respiratory: Goal: Patent airway  maintenance will improve Outcome: Progressing   Problem: Role Relationship: Goal: Ability to communicate will improve Outcome: Progressing   Problem: Education: Goal: Ability to describe self-care measures that may prevent or decrease complications (Diabetes Survival Skills Education) will improve Outcome: Progressing Goal: Individualized Educational Video(s) Outcome: Progressing   Problem: Coping: Goal: Ability to adjust to condition or change in health will improve Outcome: Progressing   Problem: Fluid Volume: Goal: Ability to maintain a balanced intake and output will improve Outcome: Progressing   Problem: Health Behavior/Discharge Planning: Goal: Ability to identify and utilize available resources and services will improve Outcome: Progressing Goal: Ability to manage health-related needs will improve Outcome: Progressing   Problem: Metabolic: Goal: Ability to maintain appropriate glucose levels will improve Outcome: Progressing   Problem: Nutritional: Goal: Maintenance of adequate nutrition will improve Outcome: Progressing Goal: Progress toward achieving an optimal weight will improve Outcome: Progressing   Problem: Skin Integrity: Goal: Risk for impaired skin integrity will decrease Outcome: Progressing   Problem: Tissue Perfusion: Goal: Adequacy of tissue perfusion will improve Outcome: Progressing   Problem: Activity: Goal: Ability to tolerate increased activity will improve Outcome: Progressing   Problem: Respiratory: Goal: Ability to maintain a clear airway and adequate ventilation will improve Outcome: Progressing   Problem: Role Relationship: Goal: Method of communication will improve Outcome: Progressing

## 2024-03-27 NOTE — Interval H&P Note (Signed)
 History and Physical Interval Note:  03/27/2024 12:34 PM  Stephen Bowen  has presented today for surgery, with the diagnosis of Pharyngeal mass.  The various methods of treatment have been discussed with the patient and family. After consideration of risks, benefits and other options for treatment, the patient has consented to  Procedure(s): DIRECT MICROLARYNGOSCOPY, WITH PROCEDURE USING LASER AND BIOPSY (N/A) as a surgical intervention.  The patient's history has been reviewed, patient examined, no change in status, stable for surgery.  I have reviewed the patient's chart and labs.  Questions were answered to the patient's satisfaction.     Velencia Lenart

## 2024-03-28 DIAGNOSIS — R042 Hemoptysis: Secondary | ICD-10-CM | POA: Diagnosis not present

## 2024-03-28 DIAGNOSIS — E46 Unspecified protein-calorie malnutrition: Secondary | ICD-10-CM

## 2024-03-28 DIAGNOSIS — C140 Malignant neoplasm of pharynx, unspecified: Secondary | ICD-10-CM | POA: Diagnosis present

## 2024-03-28 DIAGNOSIS — J392 Other diseases of pharynx: Secondary | ICD-10-CM | POA: Diagnosis not present

## 2024-03-28 LAB — GLUCOSE, CAPILLARY
Glucose-Capillary: 101 mg/dL — ABNORMAL HIGH (ref 70–99)
Glucose-Capillary: 82 mg/dL (ref 70–99)
Glucose-Capillary: 86 mg/dL (ref 70–99)
Glucose-Capillary: 76 mg/dL (ref 70–99)
Glucose-Capillary: 92 mg/dL (ref 70–99)
Glucose-Capillary: 89 mg/dL (ref 70–99)

## 2024-03-28 MED ORDER — LORAZEPAM 2 MG/ML IJ SOLN
1.0000 mg | Freq: Once | INTRAMUSCULAR | Status: AC
  Administered 2024-03-28: 1 mg via INTRAVENOUS
  Filled 2024-03-28: qty 1

## 2024-03-28 MED ORDER — DEXTROSE 5 % IV SOLN: INTRAVENOUS | Status: AC

## 2024-03-28 MED ORDER — ORAL CARE MOUTH RINSE: 15.0000 mL | OROMUCOSAL | Status: DC | PRN

## 2024-03-28 MED ORDER — SCOPOLAMINE 1 MG/3DAYS TD PT72
1.0000 | MEDICATED_PATCH | TRANSDERMAL | Status: DC
  Administered 2024-03-28 – 2024-04-03 (×3): 1 mg via TRANSDERMAL
  Filled 2024-03-28 (×3): qty 1

## 2024-03-28 MED ORDER — ORAL CARE MOUTH RINSE
15.0000 mL | OROMUCOSAL | Status: DC
  Administered 2024-03-29 – 2024-04-27 (×49): 15 mL via OROMUCOSAL

## 2024-03-28 NOTE — Progress Notes (Signed)
 Pt taken off the ventilator at this time and placed on aerosol trach collar. 6L 30%. Pt cuff partially deflated for pt comfort, did not tolerate complete deflation. Per CCM MD okay to leave pt on ATC as long as tolerates.

## 2024-03-28 NOTE — Progress Notes (Signed)
 SLP Cancellation Note  Patient Details Name: Stephen Bowen MRN: [id number] DOB: 09-29-1955   Cancelled treatment:       Reason Eval/Treat Not Completed: Medical issues which prohibited therapy. Pt currently on the vent. Will continue f/u.     Wilder Kin, MA, CCC-SLP Acute Rehabilitation Services Office Number: [phone]  Wilder KANDICE Kin 03/28/2024, 9:03 AM

## 2024-03-28 NOTE — Progress Notes (Signed)
 NAME:  Stephen Bowen, MRN:  [id number], DOB:  [birth year], LOS: 1 ADMISSION DATE:  03/27/2024, CONSULTATION DATE: 03/27/2024 REFERRING MD: Dr. Gerardine, CHIEF COMPLAINT: Post tracheostomy, pharyngeal mass  History of Present Illness:  68 yo M who presented 10/3 for biopsy of pharyngeal mass and tracheostomy placement. Leading up to case has been having difficulty swallowing and some hemoptysis  Frozen section during case is positive for SCC   To be admitted to ICU post op   PCCM consulted in this setting   Pertinent  Medical History   Past Medical History:  Diagnosis Date   CKD (chronic kidney disease), stage II    Coronary artery disease    Dementia (HCC)    Diabetes mellitus without complication (HCC)    Encephalopathy acute 07/2016   HTN (hypertension)    Hyperlipemia    Myocardial infarction (HCC)    Non-healing surgical wound    left AKA   Noncompliance    PAD (peripheral artery disease)    a.  s/p left femoral to PT artery bypass with propaten on 02/16/16.   S/P AKA (above knee amputation) bilateral (HCC)    Tobacco abuse     Significant Hospital Events: Including procedures, antibiotic start and stop dates in addition to other pertinent events   10/3 laryngoscopy, biopsy, tracheostomy  Interim History / Subjective:  No overnight events A lot of secretions Some pain/discomfort required Dilaudid   Objective    Blood pressure (!) 191/104, pulse 95, temperature 97.9 F (36.6 C), temperature source Oral, resp. rate 17, height 5' 6 (1.676 m), weight 42.7 kg, SpO2 100%.    Vent Mode: PRVC FiO2 (%):  [40 %] 40 % Set Rate:  [16 bmp] 16 bmp Vt Set:  [510 mL] 510 mL PEEP:  [5 cmH20] 5 cmH20 Plateau Pressure:  [14 cmH20-18 cmH20] 18 cmH20   Intake/Output Summary (Last 24 hours) at 03/28/2024 0840 Last data filed at 03/28/2024 0600 Gross per 24 hour  Intake 219.01 ml  Output 665 ml  Net -445.99 ml   Filed Weights   03/27/24 1016 03/27/24 1708  Weight: 51.1 kg  42.7 kg   Examination: General: Chronically ill-appearing HENT: Tracheostomy in place, copious mucoid secretions around trach Lungs: Fair air entry bilaterally, decreased at the bases Cardiovascular: S1-S2 appreciated Abdomen: Soft, bowel sounds appreciated Extremities: BKA bilaterally Neuro: Arousable, sleepy from meds GU:   I reviewed last 24 h vitals and pain scores, last 48 h intake and output, last 24 h labs and trends, and last 24 h imaging results.  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 - On full vent support - Did not tolerate pressure support wean with sedation  Hemoptysis - Will continue to monitor  Protein calorie malnutrition - Need to address nutritional status - May need feeding tube and possibly needs a PEG   PT OT consult in place Speech consult in place  Required sedation this morning Once able to wean off sedation will attempt to wean and attempt trach collar   Labs   CBC: Recent Labs  Lab 03/27/24 1756 03/27/24 1758  WBC 7.9  --   HGB 10.8*  --   HCT 33.2*  --   MCV 94.6  --   PLT 408* 433*    Basic Metabolic Panel: No results for input(s): NA, K, CL, CO2, GLUCOSE, BUN, CREATININE, CALCIUM , MG, PHOS in the last 168 hours. GFR: CrCl cannot be calculated (Patient's  most recent lab result is older than the maximum 21 days allowed.). Recent Labs  Lab 03/27/24 1756  WBC 7.9    Liver Function Tests: No results for input(s): AST, ALT, ALKPHOS, BILITOT, PROT, ALBUMIN  in the last 168 hours. No results for input(s): LIPASE, AMYLASE in the last 168 hours. No results for input(s): AMMONIA in the last 168 hours.  ABG    Component Value Date/Time   PHART 7.439 01/09/2018 1650   PCO2ART 32.8 01/09/2018 1650   PO2ART 355.0 (H) 01/09/2018 1650   HCO3 22.1 01/09/2018 1650   TCO2 24 02/09/2021 0815   ACIDBASEDEF 1.0 01/09/2018  1650   O2SAT 100.0 01/09/2018 1650     Coagulation Profile: Recent Labs  Lab 03/27/24 1758  INR 1.1    Cardiac Enzymes: No results for input(s): CKTOTAL, CKMB, CKMBINDEX, TROPONINI in the last 168 hours.  HbA1C: Hgb A1c MFr Bld  Date/Time Value Ref Range Status  03/27/2024 05:56 PM 5.9 (H) 4.8 - 5.6 % Final    Comment:    (NOTE) Diagnosis of Diabetes The following HbA1c ranges recommended by the American Diabetes Association (ADA) may be used as an aid in the diagnosis of diabetes mellitus.  Hemoglobin             Suggested A1C NGSP%              Diagnosis  <5.7                   Non Diabetic  5.7-6.4                Pre-Diabetic  >6.4                   Diabetic  <7.0                   Glycemic control for                       adults with diabetes.    01/09/2018 10:19 AM 6.0 (H) 4.8 - 5.6 % Final    Comment:    (NOTE) Pre diabetes:          5.7%-6.4% Diabetes:              >6.4% Glycemic control for   <7.0% adults with diabetes     CBG: Recent Labs  Lab 03/27/24 1648 03/27/24 1913 03/27/24 2323 03/28/24 0313 03/28/24 0731  GLUCAP 89 108* 104* 101* 82    Review of Systems:   Complaining of pain and discomfort  Past Medical History:  He,  has a past medical history of CKD (chronic kidney disease), stage II, Coronary artery disease, Dementia (HCC), Diabetes mellitus without complication (HCC), Encephalopathy acute (07/2016), HTN (hypertension), Hyperlipemia, Myocardial infarction (HCC), Non-healing surgical wound, Noncompliance, PAD (peripheral artery disease), S/P AKA (above knee amputation) bilateral (HCC), and Tobacco abuse.   Surgical History:   Past Surgical History:  Procedure Laterality Date   ABDOMINAL AORTOGRAM W/LOWER EXTREMITY N/A 08/02/2016   Procedure: Abdominal Aortogram w/Lower Extremity;  Surgeon: Penne Lonni Colorado, MD;  Location: [facility] INVASIVE CV LAB;  Service: Cardiovascular;  Laterality: N/A;   ABOVE KNEE LEG AMPUTATION  Right 12/04/2016   AMPUTATION Left 06/25/2016   Procedure: AMPUTATION ABOVE KNEE;  Surgeon: Gaile LELON New, MD;  Location: [facility];  Service: Vascular;  Laterality: Left;   AMPUTATION Right 12/04/2016   Procedure: RIGHT AMPUTATION ABOVE KNEE;  Surgeon: [place] Penne Lonni, MD;  Location: [facility]  OR;  Service: Vascular;  Laterality: Right;   AMPUTATION Left 10/08/2017   Procedure: LEFT ABOVE KNEE AMPUTATION REVISION;  Surgeon: Sheree Penne Bruckner, MD;  Location: [facility] OR;  Service: Vascular;  Laterality: Left;   APPLICATION OF WOUND VAC Left 02/09/2021   Procedure: APPLICATION OF WOUND VAC;  Surgeon: Sheree Penne Bruckner, MD;  Location: [facility] OR;  Service: Vascular;  Laterality: Left;   BYPASS GRAFT POPLITEAL TO TIBIAL Left 06/21/2016   Procedure: BYPASS GRAFT POPLITEAL TO TIBIAL USING GORE PROPATEN GRAFT;  Surgeon: Penne Bruckner Sheree, MD;  Location: [facility] OR;  Service: Vascular;  Laterality: Left;   EMBOLECTOMY Left 06/21/2016   Procedure: EMBOLECTOMY left lower extremity.;  Surgeon: Penne Bruckner Sheree, MD;  Location: [facility] OR;  Service: Vascular;  Laterality: Left;   ENDARTERECTOMY FEMORAL Left 02/16/2016   Procedure: LEFT FEMORAL ENDARTERECTOMY;  Surgeon: Penne Bruckner Sheree, MD;  Location: [facility] OR;  Service: Vascular;  Laterality: Left;   FEMORAL BYPASS  02/16/2016   LEFT FEMORAL-POSTERIOR TIBIAL ARTERY BYPASS  WITH PROPATEN 6 MM X 80 CM VASCULAR RING GRAFT (Left)   FEMORAL-TIBIAL BYPASS GRAFT Left 02/16/2016   Procedure: LEFT FEMORAL-POSTERIOR TIBIAL ARTERY BYPASS  WITH PROPATEN 6 MM X 80 CM VASCULAR RING GRAFT;  Surgeon: Penne Bruckner Sheree, MD;  Location: [facility] OR;  Service: Vascular;  Laterality: Left;   FRACTURE SURGERY     I & D EXTREMITY Left 09/03/2016   Procedure: IRRIGATION AND DEBRIDEMENT EXTREMITY - LEFT AKA;  Surgeon: Penne Bruckner Sheree, MD;  Location: [facility] OR;  Service: Vascular;  Laterality: Left;   I & D EXTREMITY Left 01/09/2018   Procedure: IRRIGATION AND  DEBRIDEMENT GROIN;  Surgeon: Sheree Penne Bruckner, MD;  Location: [facility] OR;  Service: Vascular;  Laterality: Left;   INCISION AND DRAINAGE Left 02/09/2021   Procedure: INCISION AND DRAINAGE OF LEFT GROIN WOUND;  Surgeon: Sheree Penne Bruckner, MD;  Location: [facility] OR;  Service: Vascular;  Laterality: Left;   PATCH ANGIOPLASTY Left 02/16/2016   Procedure: PATCH ANGIOPLASTY WITH GEORGE BIOLOGIC PATCH 1 CM X 6 CM;  Surgeon: Penne Bruckner Sheree, MD;  Location: [facility] OR;  Service: Vascular;  Laterality: Left;   PATCH ANGIOPLASTY Left 01/09/2018   Procedure: PATCH ANGIOPLASTY OF LEFT COMMON FEMORAL AND PROFUNDA  USING SAPHENOUS VEIN;  Surgeon: Sheree Penne Bruckner, MD;  Location: [facility] OR;  Service: Vascular;  Laterality: Left;   PERIPHERAL VASCULAR BALLOON ANGIOPLASTY Right 08/02/2016   Procedure: Peripheral Vascular Balloon Angioplasty;  Surgeon: Penne Bruckner Sheree, MD;  Location: [facility] INVASIVE CV LAB;  Service: Cardiovascular;  Laterality: Right;  peroneal artery   PERIPHERAL VASCULAR CATHETERIZATION N/A 07/07/2015   Procedure: Abdominal Aortogram w/Lower Extremity;  Surgeon: Redell LITTIE Door, MD;  Location: [facility] INVASIVE CV LAB;  Service: Cardiovascular;  Laterality: N/A;   PERIPHERAL VASCULAR CATHETERIZATION N/A 02/15/2016   Procedure: Abdominal Aortogram w/Lower Extremity;  Surgeon: Penne Bruckner Sheree, MD;  Location: [facility] INVASIVE CV LAB;  Service: Cardiovascular;  Laterality: N/A;   PERIPHERAL VASCULAR CATHETERIZATION Bilateral 06/20/2016   Procedure: Lower Extremity Angiography;  Surgeon: Penne Bruckner Sheree, MD;  Location: [facility] INVASIVE CV LAB;  Service: Cardiovascular;  Laterality: Bilateral;  limited runoff on rt leg thrombolysis lt leg bypass graft   PERIPHERAL VASCULAR INTERVENTION Right 08/02/2016   Procedure: Peripheral Vascular Intervention;  Surgeon: Penne Bruckner Sheree, MD;  Location: [facility] INVASIVE CV LAB;  Service: Cardiovascular;  Laterality: Right;  Femoral , popliteal    TIBIA FRACTURE SURGERY Left 2000   per patient, he has rod inside his left  leg.   VEIN HARVEST Left 01/09/2018   Procedure: LEFT SAPHENOUS VEIN HARVEST;  Surgeon: Sheree Penne Bruckner, MD;  Location: [facility] OR;  Service: Vascular;  Laterality: Left;     Social History:   reports that he has been smoking cigarettes. He has a 120 pack-year smoking history. He has never used smokeless tobacco. He reports that he does not currently use alcohol after a past usage of about 14.0 standard drinks of alcohol per week. He reports that he does not use drugs.   Family History:  His family history includes Cancer in his mother; Heart attack in his mother; Heart disease in his brother and father; Stroke in his sister.   Allergies No Known Allergies   The patient is critically ill with multiple organ systems failure and requires high complexity decision making for assessment and support, frequent evaluation and titration of therapies, application of advanced monitoring technologies and extensive interpretation of multiple databases. Critical Care Time devoted to patient care services described in this note independent of APP/resident time (if applicable)  is 33 minutes.   Jennet Epley MD [facility] Personal pager: See Amion If unanswered, please page CCM On-call: #[phone]

## 2024-03-28 NOTE — Plan of Care (Signed)
  Problem: Education: Goal: Knowledge of General Education information will improve Description: Including pain rating scale, medication(s)/side effects and non-pharmacologic comfort measures Outcome: Progressing   Problem: Health Behavior/Discharge Planning: Goal: Ability to manage health-related needs will improve Outcome: Progressing   Problem: Clinical Measurements: Goal: Ability to maintain clinical measurements within normal limits will improve Outcome: Progressing Goal: Will remain free from infection Outcome: Progressing Goal: Diagnostic test results will improve Outcome: Progressing Goal: Respiratory complications will improve Outcome: Progressing Goal: Cardiovascular complication will be avoided Outcome: Progressing   Problem: Activity: Goal: Risk for activity intolerance will decrease Outcome: Progressing   Problem: Nutrition: Goal: Adequate nutrition will be maintained Outcome: Progressing   Problem: Coping: Goal: Level of anxiety will decrease Outcome: Progressing   Problem: Elimination: Goal: Will not experience complications related to bowel motility Outcome: Progressing Goal: Will not experience complications related to urinary retention Outcome: Progressing   Problem: Pain Managment: Goal: General experience of comfort will improve and/or be controlled Outcome: Progressing   Problem: Safety: Goal: Ability to remain free from injury will improve Outcome: Progressing   Problem: Skin Integrity: Goal: Risk for impaired skin integrity will decrease Outcome: Progressing   Problem: Education: Goal: Knowledge about tracheostomy care/management will improve Outcome: Progressing   Problem: Activity: Goal: Ability to tolerate increased activity will improve Outcome: Progressing   Problem: Health Behavior/Discharge Planning: Goal: Ability to manage tracheostomy will improve Outcome: Progressing   Problem: Respiratory: Goal: Patent airway  maintenance will improve Outcome: Progressing   Problem: Role Relationship: Goal: Ability to communicate will improve Outcome: Progressing   Problem: Education: Goal: Ability to describe self-care measures that may prevent or decrease complications (Diabetes Survival Skills Education) will improve Outcome: Progressing Goal: Individualized Educational Video(s) Outcome: Progressing   Problem: Coping: Goal: Ability to adjust to condition or change in health will improve Outcome: Progressing   Problem: Fluid Volume: Goal: Ability to maintain a balanced intake and output will improve Outcome: Progressing   Problem: Health Behavior/Discharge Planning: Goal: Ability to identify and utilize available resources and services will improve Outcome: Progressing Goal: Ability to manage health-related needs will improve Outcome: Progressing   Problem: Metabolic: Goal: Ability to maintain appropriate glucose levels will improve Outcome: Progressing   Problem: Nutritional: Goal: Maintenance of adequate nutrition will improve Outcome: Progressing Goal: Progress toward achieving an optimal weight will improve Outcome: Progressing   Problem: Skin Integrity: Goal: Risk for impaired skin integrity will decrease Outcome: Progressing   Problem: Tissue Perfusion: Goal: Adequacy of tissue perfusion will improve Outcome: Progressing   Problem: Activity: Goal: Ability to tolerate increased activity will improve Outcome: Progressing   Problem: Respiratory: Goal: Ability to maintain a clear airway and adequate ventilation will improve Outcome: Progressing   Problem: Role Relationship: Goal: Method of communication will improve Outcome: Progressing

## 2024-03-28 NOTE — Progress Notes (Signed)
   03/28/24 1025  Therapy Vitals  Pulse Rate 94  Patient Position (if appropriate) Lying  Respiratory Assessment  Assessment Type Assess only  Respiratory Pattern Regular;Unlabored;Symmetrical  Chest Assessment Chest expansion symmetrical  Cough Productive;Congested;Strong  Sputum Amount Copious  Sputum Color Pink tinged;Clear  Sputum Consistency Thin;Thick  Sputum Specimen Source Spontaneous cough  Bilateral Breath Sounds Diminished;Rhonchi  Oxygen Therapy/Pulse Ox  O2 Device (S)  Tracheostomy Collar  O2 Therapy (S)  Oxygen humidified  O2 Flow Rate (L/min) (S)  6 L/min  FiO2 (%) (S)  30 %  Tracheostomy Shiley Flexible 6 mm Cuffed  Placement Date/Time: 03/27/24 1424   Inserted prior to hospital arrival?: Other (Comment)  Brand: Shiley Flexible  Size (mm): 6 mm  Style: Cuffed  Status Secured with trach ties and sutures  Site Assessment Oozing secretions;Bleeding  Site Care Cleansed;Dried;Open to air;Dressing applied  Ties Assessment Clean, Dry  Cuff Pressure (cm H2O)  (partially deflated. 3cc air in cuff for comfort. did not tolerate complete deflation.)  Tracheostomy Equipment at bedside Yes and checklist posted at head of bed

## 2024-03-28 NOTE — Progress Notes (Signed)
 ENT PROGRESS NOTE   Subjective: Patient seen and examined at bedside. Remained on vent support overnight, plan to trial on pressure support today and if tolerates to transition to trach collar. Frozen section positive for SCCa.   Objective: Vital signs in last 24 hours: Temp:  [96.5 F (35.8 C)-98.5 F (36.9 C)] 97.9 F (36.6 C) (10/04 0736) Pulse Rate:  [49-112] 95 (10/04 0800) Resp:  [16-18] 17 (10/04 0800) BP: (82-191)/(59-122) 191/104 (10/04 0800) SpO2:  [94 %-100 %] 100 % (10/04 0800) FiO2 (%):  [40 %] 40 % (10/04 0800) Weight:  [42.7 kg-51.1 kg] 42.7 kg (10/03 1708)  Exam: General: Well-developed, well-nourished Respiratory Respiratory effort: on vent support Cardiovascular Peripheral Vascular: Warm extremities with equal color/perfusion Eyes: No nystagmus with equal extraocular motion bilaterally Neuro/Psych/Balance: Patient oriented to person, place, and time; Appropriate mood and affect; Gait is intact with no imbalance; Cranial nerves I-XII are intact Head and Face Inspection: Normocephalic and atraumatic without mass or lesion Palpation: Facial skeleton intact without bony stepoffs Salivary Glands: No mass or tenderness  Facial Strength: Facial motility symmetric and full bilaterally ENT Pinna: External ear intact and fully developed External canal: Canal is patent with intact skin Tympanic Membrane: Clear and mobile External Nose: No scar or anatomic deformity Neck Neck and Trachea: Midline trachea without mass or lesion, trach in place 6-0 cuffed Shiley cuff up on vent Thyroid: No mass or nodularity Lymphatics: No lymphadenopathy   Assessment/Plan: S/p pharyngeal wall mass biopsy with frozen section positive for squamous cell carcinoma. S/p tracheostomy to avoid re-intubation in the setting of active bleeding due to hemoptysis preop and friable tissue/bleeding during biopsy.    - CT Angio of the neck to rule out tumor invasion of carotid arteries and other  major vessels - ordered today - 6-0 cuffed Shiley in place, ok to deflate the cuff when off vent support - keep cuffed trach in place/don't change to cuffless trach in case there is recurrent oral bleeding and the cuff can be re-inflated to protect the airway - preop dysphagia - needs to get speech evaluation and consult for PEG placement - when out of ICU will require Hospitalist/Medicine as primary to manage multiple comorbidities    LOS: 1 day    Elena Larry, MD 03/28/2024, 9:52 AM

## 2024-03-28 NOTE — Progress Notes (Signed)
 PT Cancellation Note  Patient Details Name: Stephen Bowen MRN: [id number] DOB: 1956-05-04   Cancelled Treatment:    Reason Eval/Treat Not Completed: Fatigue/lethargy limiting ability to participate, pt declined, asked to be allowed to rest for today. Pt also with HR to 163bpm with coughing, RN present to suction. Will continue to follow and evaluate as able.   Izetta Call, PT, DPT   Acute Rehabilitation Department Office [phone] Secure Chat Communication Preferred   Izetta JULIANNA Call 03/28/2024, 4:56 PM

## 2024-03-29 ENCOUNTER — Inpatient Hospital Stay (HOSPITAL_COMMUNITY)

## 2024-03-29 DIAGNOSIS — E46 Unspecified protein-calorie malnutrition: Secondary | ICD-10-CM | POA: Diagnosis not present

## 2024-03-29 DIAGNOSIS — R042 Hemoptysis: Secondary | ICD-10-CM | POA: Diagnosis not present

## 2024-03-29 DIAGNOSIS — J392 Other diseases of pharynx: Secondary | ICD-10-CM | POA: Diagnosis not present

## 2024-03-29 LAB — BASIC METABOLIC PANEL WITH GFR
Anion gap: 8 (ref 5–15)
BUN: 5 mg/dL — ABNORMAL LOW (ref 8–23)
CO2: 23 mmol/L (ref 22–32)
Calcium: 8.1 mg/dL — ABNORMAL LOW (ref 8.9–10.3)
Chloride: 100 mmol/L (ref 98–111)
Creatinine, Ser: 0.73 mg/dL (ref 0.61–1.24)
GFR, Estimated: >60 mL/min (ref >60)
Glucose, Bld: 360 mg/dL — ABNORMAL HIGH (ref 70–99)
Potassium: 3.1 mmol/L — ABNORMAL LOW (ref 3.5–5.1)
Sodium: 131 mmol/L — ABNORMAL LOW (ref 135–145)

## 2024-03-29 LAB — CBC
HCT: 33.4 % — ABNORMAL LOW (ref 39.0–52.0)
Hemoglobin: 10.8 g/dL — ABNORMAL LOW (ref 13.0–17.0)
MCH: 29.8 pg (ref 26.0–34.0)
MCHC: 32.3 g/dL (ref 30.0–36.0)
MCV: 92.3 fL (ref 80.0–100.0)
Platelets: 409 K/uL — ABNORMAL HIGH (ref 150–400)
RBC: 3.62 MIL/uL — ABNORMAL LOW (ref 4.22–5.81)
RDW: 13.8 % (ref 11.5–15.5)
WBC: 9.1 K/uL (ref 4.0–10.5)
nRBC: 0 % (ref 0.0–0.2)

## 2024-03-29 LAB — GLUCOSE, CAPILLARY
Glucose-Capillary: 96 mg/dL (ref 70–99)
Glucose-Capillary: 84 mg/dL (ref 70–99)
Glucose-Capillary: 74 mg/dL (ref 70–99)
Glucose-Capillary: 104 mg/dL — ABNORMAL HIGH (ref 70–99)
Glucose-Capillary: 93 mg/dL (ref 70–99)

## 2024-03-29 LAB — PHOSPHORUS: Phosphorus: 3.1 mg/dL (ref 2.5–4.6)

## 2024-03-29 LAB — MAGNESIUM: Magnesium: 1.4 mg/dL — ABNORMAL LOW (ref 1.7–2.4)

## 2024-03-29 MED ORDER — HEPARIN SODIUM (PORCINE) 5000 UNIT/ML IJ SOLN
5000.0000 [IU] | Freq: Three times a day (TID) | INTRAMUSCULAR | Status: DC
  Administered 2024-03-29 – 2024-04-18 (×39): 5000 [IU] via SUBCUTANEOUS
  Filled 2024-03-29 (×54): qty 1

## 2024-03-29 MED ORDER — MAGNESIUM SULFATE 4 GM/100ML IV SOLN
4.0000 g | Freq: Once | INTRAVENOUS | Status: AC
  Administered 2024-03-29: 4 g via INTRAVENOUS
  Filled 2024-03-29: qty 100

## 2024-03-29 MED ORDER — POTASSIUM CHLORIDE 10 MEQ/100ML IV SOLN
10.0000 meq | INTRAVENOUS | Status: AC
  Administered 2024-03-29 (×6): 10 meq via INTRAVENOUS
  Filled 2024-03-29 (×6): qty 100

## 2024-03-29 MED ORDER — LORAZEPAM 2 MG/ML IJ SOLN
1.0000 mg | Freq: Once | INTRAMUSCULAR | Status: AC
  Administered 2024-03-29: 1 mg via INTRAVENOUS
  Filled 2024-03-29: qty 1

## 2024-03-29 MED ORDER — TRAZODONE HCL 100 MG PO TABS
100.0000 mg | ORAL_TABLET | Freq: Every evening | ORAL | Status: DC | PRN
  Administered 2024-04-04: 100 mg via ORAL
  Filled 2024-03-29: qty 1

## 2024-03-29 MED ORDER — IOHEXOL 350 MG/ML SOLN
75.0000 mL | Freq: Once | INTRAVENOUS | Status: AC | PRN
  Administered 2024-03-29: 75 mL via INTRAVENOUS

## 2024-03-29 NOTE — Progress Notes (Signed)
 Pt cuff on trach able to be fully deflated at this time without complication. Pt tolerating well. RT will continue to monitor and be available as needed.

## 2024-03-29 NOTE — Progress Notes (Signed)
 [facility] ADULT ICU REPLACEMENT PROTOCOL   The patient does apply for the [facility] Adult ICU Electrolyte Replacment Protocol based on the criteria listed below:   1.Exclusion criteria: TCTS, ECMO, Dialysis, and Myasthenia Gravis patients 2. Is GFR >/= 30 ml/min? Yes.    Patient's GFR today is >60 3. Is SCr </= 2? Yes.   Patient's SCr is 0.73 mg/dL 4. Did SCr increase >/= 0.5 in 24 hours? No. 5.Pt's weight >40kg  Yes.   6. Abnormal electrolyte(s): K, Mag  7. Electrolytes replaced per protocol 8.  Call MD STAT for K+ </= 2.5, Phos </= 1, or Mag </= 1 Physician:  Haze Hunter BRAVO Keyston Ardolino 03/29/2024 6:05 AM

## 2024-03-29 NOTE — Progress Notes (Signed)
   03/29/24 0750  Therapy Vitals  Pulse Rate 100  Resp 20  Patient Position (if appropriate) Lying  MEWS Score/Color  MEWS Score 1  MEWS Score Color Green  Respiratory Assessment  Assessment Type Assess only  Respiratory Pattern Regular;Unlabored  Chest Assessment Chest expansion symmetrical  Cough Strong  Bilateral Breath Sounds Clear  Oxygen Therapy/Pulse Ox  O2 Device Tracheostomy Collar  O2 Therapy Oxygen humidified  O2 Flow Rate (L/min) (S)  6 L/min  FiO2 (%) (S)  28 %  Equipment wiped down Yes  Tracheostomy Shiley Flexible 6 mm Cuffed  Placement Date/Time: 03/27/24 1424   Inserted prior to hospital arrival?: Other (Comment)  Brand: Shiley Flexible  Size (mm): 6 mm  Style: Cuffed  Status Secured with trach ties and sutures  Site Assessment Oozing secretions  Site Care Cleansed;Dried;Dressing applied;Open to air  Inner Cannula Care Changed/new  Ties Assessment Clean, Dry  Cuff Pressure (cm H2O)  (deflated completely.)  Tracheostomy Equipment at bedside Yes and checklist posted at head of bed

## 2024-03-29 NOTE — Care Plan (Signed)
 Patient arrived to the unit complaining of pain 0/10 No signs/symptoms of distress   Stephen Bowen, [place]

## 2024-03-29 NOTE — Progress Notes (Signed)
 eLink Physician-Brief Progress Note Patient Name: Stephen Bowen DOB: [birth year] MRN: [id number]   Date of Service  03/29/2024  HPI/Events of Note  Patient with a pharyngeal mass presented for management of his.  Received 1 mg of Ativan  for insomnia dispensed at 2033.  Requesting to see if this can be renewed.  CBG 93.  eICU Interventions  No indication for IV fluids Ativan  already dispensed, no further benzos Add trazodone as needed for insomnia     Intervention Category Minor Interventions: Routine modifications to care plan (e.g. PRN medications for pain, fever)  Gurnoor Sloop 03/29/2024, 9:01 PM

## 2024-03-29 NOTE — Evaluation (Signed)
 Passy-Muir Speaking Valve - Evaluation Patient Details  Name: Stephen Bowen MRN: [id number] Date of Birth: 01-28-1956  Today's Date: 03/29/2024 Time: 1258-1314 SLP Time Calculation (min) (ACUTE ONLY): 16 min  Past Medical History:  Past Medical History:  Diagnosis Date   CKD (chronic kidney disease), stage II    Coronary artery disease    Dementia (HCC)    Diabetes mellitus without complication (HCC)    Encephalopathy acute 07/2016   HTN (hypertension)    Hyperlipemia    Myocardial infarction (HCC)    Non-healing surgical wound    left AKA   Noncompliance    PAD (peripheral artery disease)    a.  s/p left femoral to PT artery bypass with propaten on 02/16/16.   S/P AKA (above knee amputation) bilateral (HCC)    Tobacco abuse    Past Surgical History:  Past Surgical History:  Procedure Laterality Date   ABDOMINAL AORTOGRAM W/LOWER EXTREMITY N/A 08/02/2016   Procedure: Abdominal Aortogram w/Lower Extremity;  Surgeon: Penne Lonni Colorado, MD;  Location: [facility] INVASIVE CV LAB;  Service: Cardiovascular;  Laterality: N/A;   ABOVE KNEE LEG AMPUTATION Right 12/04/2016   AMPUTATION Left 06/25/2016   Procedure: AMPUTATION ABOVE KNEE;  Surgeon: Gaile LELON New, MD;  Location: [facility] OR;  Service: Vascular;  Laterality: Left;   AMPUTATION Right 12/04/2016   Procedure: RIGHT AMPUTATION ABOVE KNEE;  Surgeon: Colorado Penne Lonni, MD;  Location: [facility] OR;  Service: Vascular;  Laterality: Right;   AMPUTATION Left 10/08/2017   Procedure: LEFT ABOVE KNEE AMPUTATION REVISION;  Surgeon: [place] Penne Lonni, MD;  Location: [facility] OR;  Service: Vascular;  Laterality: Left;   APPLICATION OF WOUND VAC Left 02/09/2021   Procedure: APPLICATION OF WOUND VAC;  Surgeon: Colorado Penne Lonni, MD;  Location: [facility] OR;  Service: Vascular;  Laterality: Left;   BYPASS GRAFT POPLITEAL TO TIBIAL Left 06/21/2016   Procedure: BYPASS GRAFT POPLITEAL TO TIBIAL USING GORE PROPATEN GRAFT;  Surgeon: Penne Lonni Colorado, MD;  Location: [facility] OR;  Service: Vascular;  Laterality: Left;   EMBOLECTOMY Left 06/21/2016   Procedure: EMBOLECTOMY left lower extremity.;  Surgeon: Penne Lonni Colorado, MD;  Location: [facility] OR;  Service: Vascular;  Laterality: Left;   ENDARTERECTOMY FEMORAL Left 02/16/2016   Procedure: LEFT FEMORAL ENDARTERECTOMY;  Surgeon: Penne Lonni Colorado, MD;  Location: [facility] OR;  Service: Vascular;  Laterality: Left;   FEMORAL BYPASS  02/16/2016   LEFT FEMORAL-POSTERIOR TIBIAL ARTERY BYPASS  WITH PROPATEN 6 MM X 80 CM VASCULAR RING GRAFT (Left)   FEMORAL-TIBIAL BYPASS GRAFT Left 02/16/2016   Procedure: LEFT FEMORAL-POSTERIOR TIBIAL ARTERY BYPASS  WITH PROPATEN 6 MM X 80 CM VASCULAR RING GRAFT;  Surgeon: Penne Lonni Colorado, MD;  Location: [facility] OR;  Service: Vascular;  Laterality: Left;   FRACTURE SURGERY     I & D EXTREMITY Left 09/03/2016   Procedure: IRRIGATION AND DEBRIDEMENT EXTREMITY - LEFT AKA;  Surgeon: Penne Lonni Colorado, MD;  Location: [facility] OR;  Service: Vascular;  Laterality: Left;   I & D EXTREMITY Left 01/09/2018   Procedure: IRRIGATION AND DEBRIDEMENT GROIN;  Surgeon: [place] Penne Lonni, MD;  Location: [facility] OR;  Service: Vascular;  Laterality: Left;   INCISION AND DRAINAGE Left 02/09/2021   Procedure: INCISION AND DRAINAGE OF LEFT GROIN WOUND;  Surgeon: [place] Penne Lonni, MD;  Location: [facility] OR;  Service: Vascular;  Laterality: Left;   PATCH ANGIOPLASTY Left 02/16/2016   Procedure: PATCH ANGIOPLASTY WITH GEORGE BIOLOGIC PATCH 1 CM X 6 CM;  Surgeon: Penne Lonni  Sheree, MD;  Location: [facility] OR;  Service: Vascular;  Laterality: Left;   PATCH ANGIOPLASTY Left 01/09/2018   Procedure: PATCH ANGIOPLASTY OF LEFT COMMON FEMORAL AND PROFUNDA  USING SAPHENOUS VEIN;  Surgeon: Sheree Penne Bruckner, MD;  Location: [facility] OR;  Service: Vascular;  Laterality: Left;   PERIPHERAL VASCULAR BALLOON ANGIOPLASTY Right 08/02/2016   Procedure: Peripheral Vascular Balloon Angioplasty;   Surgeon: Penne Bruckner Sheree, MD;  Location: [facility] INVASIVE CV LAB;  Service: Cardiovascular;  Laterality: Right;  peroneal artery   PERIPHERAL VASCULAR CATHETERIZATION N/A 07/07/2015   Procedure: Abdominal Aortogram w/Lower Extremity;  Surgeon: Redell LITTIE Door, MD;  Location: [facility] INVASIVE CV LAB;  Service: Cardiovascular;  Laterality: N/A;   PERIPHERAL VASCULAR CATHETERIZATION N/A 02/15/2016   Procedure: Abdominal Aortogram w/Lower Extremity;  Surgeon: Penne Bruckner Sheree, MD;  Location: [facility] INVASIVE CV LAB;  Service: Cardiovascular;  Laterality: N/A;   PERIPHERAL VASCULAR CATHETERIZATION Bilateral 06/20/2016   Procedure: Lower Extremity Angiography;  Surgeon: Penne Bruckner Sheree, MD;  Location: [facility] INVASIVE CV LAB;  Service: Cardiovascular;  Laterality: Bilateral;  limited runoff on rt leg thrombolysis lt leg bypass graft   PERIPHERAL VASCULAR INTERVENTION Right 08/02/2016   Procedure: Peripheral Vascular Intervention;  Surgeon: Penne Bruckner Sheree, MD;  Location: [facility] INVASIVE CV LAB;  Service: Cardiovascular;  Laterality: Right;  Femoral , popliteal   TIBIA FRACTURE SURGERY Left 2000   per patient, he has rod inside his left leg.   VEIN HARVEST Left 01/09/2018   Procedure: LEFT SAPHENOUS VEIN HARVEST;  Surgeon: Sheree Penne Bruckner, MD;  Location: [facility] OR;  Service: Vascular;  Laterality: Left;   HPI:  Patient is a 68 y.o. male with PMH: HTN, dementia, encephalopathy, MI, HLD, PAD, CAD, daily tobacco use. He presented to the hospital on 03/27/2024 for biopsy of pharyngeal mass and tracheostomy placement. He had been having difficulty swallowing x10 days and mainly was drinking fluids for his nutrition. 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 ENT following and recommended cuff deflation trial on 10/4. SLP ordered for PMV and swallow evaluation. Respiratory deflated patient's cuff on 10/5 in AM and patient tolerated this well.    Assessment / Plan / Recommendation  Clinical Impression  Patient  was only able to tolerate PMV placement for a total of three trials of increments from 5-10 seconds. He was able to achieve voicing during initial trial, stating his name Janathan, however subsequent trials he was largely aphonic. Prior to PMV placement, patient's HR in the low 120's, RR in low 20's and SpO2 96%. He was exhibiting frequent tracheal expectoration of thick secretions which SLP cleared via suction catheter. When PMV donned, patient without observed change in RR or SpO2 but his HR increased to mid-130's and as high as 140. He was able to exhibit tracheal secretions but with secretions observed coming from stoma as well. No ability to demonstrate oral expectoration of secretions. SLP will follow for PMV trials and swallow evaluation when appropriate. SLP Visit Diagnosis: Aphonia (R49.1)    Recommendations for use/ supervision  Patient may use Passy-Muir Speech Valve: with SLP only PMSV Supervision: Full   SLP Assessment  Patient needs continued Speech Language Pathology Services   Assistance Recommended at Discharge Intermittent Supervision/Assistance  Functional Status Assessment Patient has had a recent decline in their functional status and demonstrates the ability to make significant improvements in function in a reasonable and predictable amount of time.  Frequency and Duration min 3x week  2 weeks    PMSV Trial PMSV was placed for: three trials  for increments of 5-10 seconds Able to redirect subglottic air through upper airway: Yes Able to Attain Phonation: Yes Voice Quality: Other (comment) (dysphonic) Able to Expectorate Secretions: Yes Level of Secretion Expectoration with PMSV: Tracheal Breath Support for Phonation: Moderately decreased Intelligibility: Intelligibility reduced Word: 50-74% accurate Respirations During Trial: 22 SpO2 During Trial: 96 % Pulse During Trial: 135 Behavior: Anxious;Responsive to questions   Tracheostomy Tube       Vent Dependency   FiO2 (%): 28 %    Cuff Deflation Trial Tolerated Cuff Deflation:  (cuff deflated prior to SLP arrival)         Norleen IVAR Blase, MA, CCC-SLP Speech Therapy

## 2024-03-29 NOTE — Progress Notes (Signed)
 NAME:  Stephen Bowen, MRN:  [id number], DOB:  1955-07-15, LOS: 2 ADMISSION DATE:  03/27/2024, CONSULTATION DATE: 03/27/2024 REFERRING MD: Dr. Gerardine, CHIEF COMPLAINT: Post tracheostomy, pharyngeal mass  History of Present Illness:  68 yo M who presented 10/3 for biopsy of pharyngeal mass and tracheostomy placement. Leading up to case has been having difficulty swallowing and some hemoptysis  Frozen section during case is positive for SCC   To be admitted to ICU post op   PCCM consulted in this setting   Pertinent  Medical History   Past Medical History:  Diagnosis Date   CKD (chronic kidney disease), stage II    Coronary artery disease    Dementia (HCC)    Diabetes mellitus without complication (HCC)    Encephalopathy acute 07/2016   HTN (hypertension)    Hyperlipemia    Myocardial infarction (HCC)    Non-healing surgical wound    left AKA   Noncompliance    PAD (peripheral artery disease)    a.  s/p left femoral to PT artery bypass with propaten on 02/16/16.   S/P AKA (above knee amputation) bilateral (HCC)    Tobacco abuse     Significant Hospital Events: Including procedures, antibiotic start and stop dates in addition to other pertinent events   10/3 laryngoscopy, biopsy, tracheostomy 10/4-transitioned to trach collar, tolerated throughout the day and at night  Interim History / Subjective:  No overnight events - He did not sleep well - Was having a lot of secretions that was very bothersome, was started on scopolamine, given 1 mg of Ativan  last night-still did not sleep well  Objective    Blood pressure 92/60, pulse 97, temperature 98.5 F (36.9 C), temperature source Oral, resp. rate 20, height 5' 6 (1.676 m), weight 42.7 kg, SpO2 96%.    Vent Mode: Stand-by FiO2 (%):  [30 %-40 %] 30 % Set Rate:  [16 bmp] 16 bmp Vt Set:  [510 mL] 510 mL PEEP:  [5 cmH20] 5 cmH20 Plateau Pressure:  [16 cmH20] 16 cmH20   Intake/Output Summary (Last 24 hours) at  03/29/2024 0749 Last data filed at 03/29/2024 0600 Gross per 24 hour  Intake 590.16 ml  Output 1375 ml  Net -784.84 ml   Filed Weights   03/27/24 1016 03/27/24 1708  Weight: 51.1 kg 42.7 kg   Examination: General: Chronically ill-appearing, does not appear to be in distress HENT: Tracheostomy in place, copious secretions have improved Lungs: Fair air entry bilaterally, decreased at the bases Cardiovascular: S1-S2 appreciated Abdomen: Soft, bowel sounds appreciated Extremities: BKA bilaterally Neuro: Awake alert interactive GU:   I reviewed last 24 h vitals and pain scores, last 48 h intake and output, last 24 h labs and trends, and last 24 h imaging results.  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 - Tolerated trach collar - Hopefully we can have speech follow-up with him today and try PMV if tracheostomy balloon can be deflated  Hemoptysis - Not having significant hemoptysis any longer  Protein calorie malnutrition - IR to assist with PEG placement  PT OT consult in place Speech to follow  Reviewed remained stable, may be transition to progressive floor  Consulted general surgery for possible PEG placement   Labs   CBC: Recent Labs  Lab 03/27/24 1756 03/27/24 1758 03/29/24 0309  WBC 7.9  --  9.1  HGB 10.8*  --  10.8*  HCT 33.2*  --  33.4*  MCV 94.6  --  92.3  PLT 408* 433* 409*    Basic Metabolic Panel: Recent Labs  Lab 03/29/24 0309  NA 131*  K 3.1*  CL 100  CO2 23  GLUCOSE 360*  BUN 5*  CREATININE 0.73  CALCIUM  8.1*  MG 1.4*  PHOS 3.1   GFR: Estimated Creatinine Clearance: 53.4 mL/min (by C-G formula based on SCr of 0.73 mg/dL). Recent Labs  Lab 03/27/24 1756 03/29/24 0309  WBC 7.9 9.1    Liver Function Tests: No results for input(s): AST, ALT, ALKPHOS, BILITOT, PROT, ALBUMIN  in the last 168 hours. No results for input(s): LIPASE,  AMYLASE in the last 168 hours. No results for input(s): AMMONIA in the last 168 hours.  ABG    Component Value Date/Time   PHART 7.439 01/09/2018 1650   PCO2ART 32.8 01/09/2018 1650   PO2ART 355.0 (H) 01/09/2018 1650   HCO3 22.1 01/09/2018 1650   TCO2 24 02/09/2021 0815   ACIDBASEDEF 1.0 01/09/2018 1650   O2SAT 100.0 01/09/2018 1650     Coagulation Profile: Recent Labs  Lab 03/27/24 1758  INR 1.1    Cardiac Enzymes: No results for input(s): CKTOTAL, CKMB, CKMBINDEX, TROPONINI in the last 168 hours.  HbA1C: Hgb A1c MFr Bld  Date/Time Value Ref Range Status  03/27/2024 05:56 PM 5.9 (H) 4.8 - 5.6 % Final    Comment:    (NOTE) Diagnosis of Diabetes The following HbA1c ranges recommended by the American Diabetes Association (ADA) may be used as an aid in the diagnosis of diabetes mellitus.  Hemoglobin             Suggested A1C NGSP%              Diagnosis  <5.7                   Non Diabetic  5.7-6.4                Pre-Diabetic  >6.4                   Diabetic  <7.0                   Glycemic control for                       adults with diabetes.    01/09/2018 10:19 AM 6.0 (H) 4.8 - 5.6 % Final    Comment:    (NOTE) Pre diabetes:          5.7%-6.4% Diabetes:              >6.4% Glycemic control for   <7.0% adults with diabetes     CBG: Recent Labs  Lab 03/28/24 1512 03/28/24 1911 03/28/24 2326 03/29/24 0322 03/29/24 0738  GLUCAP 76 92 89 96 84    Review of Systems:   Complaining of pain and discomfort  Past Medical History:  He,  has a past medical history of CKD (chronic kidney disease), stage II, Coronary artery disease, Dementia (HCC), Diabetes mellitus without complication (HCC), Encephalopathy acute (07/2016), HTN (hypertension), Hyperlipemia, Myocardial infarction (HCC), Non-healing surgical wound, Noncompliance, PAD (peripheral artery disease), S/P AKA (above knee amputation) bilateral (HCC), and Tobacco abuse.   Surgical  History:   Past Surgical History:  Procedure Laterality Date   ABDOMINAL AORTOGRAM W/LOWER EXTREMITY N/A 08/02/2016   Procedure: Abdominal Aortogram w/Lower Extremity;  Surgeon: Penne Lonni Colorado, MD;  Location: [facility] INVASIVE CV  LAB;  Service: Cardiovascular;  Laterality: N/A;   ABOVE KNEE LEG AMPUTATION Right 12/04/2016   AMPUTATION Left 06/25/2016   Procedure: AMPUTATION ABOVE KNEE;  Surgeon: Gaile LELON New, MD;  Location: [facility] OR;  Service: Vascular;  Laterality: Left;   AMPUTATION Right 12/04/2016   Procedure: RIGHT AMPUTATION ABOVE KNEE;  Surgeon: Sheree Penne Bruckner, MD;  Location: [facility] OR;  Service: Vascular;  Laterality: Right;   AMPUTATION Left 10/08/2017   Procedure: LEFT ABOVE KNEE AMPUTATION REVISION;  Surgeon: Sheree Penne Bruckner, MD;  Location: [facility] OR;  Service: Vascular;  Laterality: Left;   APPLICATION OF WOUND VAC Left 02/09/2021   Procedure: APPLICATION OF WOUND VAC;  Surgeon: Sheree Penne Bruckner, MD;  Location: [facility] OR;  Service: Vascular;  Laterality: Left;   BYPASS GRAFT POPLITEAL TO TIBIAL Left 06/21/2016   Procedure: BYPASS GRAFT POPLITEAL TO TIBIAL USING GORE PROPATEN GRAFT;  Surgeon: Penne Bruckner Sheree, MD;  Location: [facility] OR;  Service: Vascular;  Laterality: Left;   EMBOLECTOMY Left 06/21/2016   Procedure: EMBOLECTOMY left lower extremity.;  Surgeon: Penne Bruckner Sheree, MD;  Location: [facility] OR;  Service: Vascular;  Laterality: Left;   ENDARTERECTOMY FEMORAL Left 02/16/2016   Procedure: LEFT FEMORAL ENDARTERECTOMY;  Surgeon: Penne Bruckner Sheree, MD;  Location: [facility] OR;  Service: Vascular;  Laterality: Left;   FEMORAL BYPASS  02/16/2016   LEFT FEMORAL-POSTERIOR TIBIAL ARTERY BYPASS  WITH PROPATEN 6 MM X 80 CM VASCULAR RING GRAFT (Left)   FEMORAL-TIBIAL BYPASS GRAFT Left 02/16/2016   Procedure: LEFT FEMORAL-POSTERIOR TIBIAL ARTERY BYPASS  WITH PROPATEN 6 MM X 80 CM VASCULAR RING GRAFT;  Surgeon: Penne Bruckner Sheree, MD;  Location: [facility] OR;  Service:  Vascular;  Laterality: Left;   FRACTURE SURGERY     I & D EXTREMITY Left 09/03/2016   Procedure: IRRIGATION AND DEBRIDEMENT EXTREMITY - LEFT AKA;  Surgeon: Penne Bruckner Sheree, MD;  Location: [facility] OR;  Service: Vascular;  Laterality: Left;   I & D EXTREMITY Left 01/09/2018   Procedure: IRRIGATION AND DEBRIDEMENT GROIN;  Surgeon: Sheree Penne Bruckner, MD;  Location: [facility] OR;  Service: Vascular;  Laterality: Left;   INCISION AND DRAINAGE Left 02/09/2021   Procedure: INCISION AND DRAINAGE OF LEFT GROIN WOUND;  Surgeon: Sheree Penne Bruckner, MD;  Location: [facility] OR;  Service: Vascular;  Laterality: Left;   PATCH ANGIOPLASTY Left 02/16/2016   Procedure: PATCH ANGIOPLASTY WITH GEORGE BIOLOGIC PATCH 1 CM X 6 CM;  Surgeon: Penne Bruckner Sheree, MD;  Location: [facility] OR;  Service: Vascular;  Laterality: Left;   PATCH ANGIOPLASTY Left 01/09/2018   Procedure: PATCH ANGIOPLASTY OF LEFT COMMON FEMORAL AND PROFUNDA  USING SAPHENOUS VEIN;  Surgeon: Sheree Penne Bruckner, MD;  Location: [facility] OR;  Service: Vascular;  Laterality: Left;   PERIPHERAL VASCULAR BALLOON ANGIOPLASTY Right 08/02/2016   Procedure: Peripheral Vascular Balloon Angioplasty;  Surgeon: Penne Bruckner Sheree, MD;  Location: [facility] INVASIVE CV LAB;  Service: Cardiovascular;  Laterality: Right;  peroneal artery   PERIPHERAL VASCULAR CATHETERIZATION N/A 07/07/2015   Procedure: Abdominal Aortogram w/Lower Extremity;  Surgeon: Redell LITTIE Door, MD;  Location: [facility] INVASIVE CV LAB;  Service: Cardiovascular;  Laterality: N/A;   PERIPHERAL VASCULAR CATHETERIZATION N/A 02/15/2016   Procedure: Abdominal Aortogram w/Lower Extremity;  Surgeon: Penne Bruckner Sheree, MD;  Location: [facility] INVASIVE CV LAB;  Service: Cardiovascular;  Laterality: N/A;   PERIPHERAL VASCULAR CATHETERIZATION Bilateral 06/20/2016   Procedure: Lower Extremity Angiography;  Surgeon: Penne Bruckner Sheree, MD;  Location: [facility] INVASIVE CV LAB;  Service: Cardiovascular;  Laterality:  Bilateral;  limited runoff on rt leg thrombolysis lt leg bypass graft   PERIPHERAL VASCULAR INTERVENTION Right 08/02/2016   Procedure: Peripheral Vascular Intervention;  Surgeon: Penne Lonni Colorado, MD;  Location: [facility] INVASIVE CV LAB;  Service: Cardiovascular;  Laterality: Right;  Femoral , popliteal   TIBIA FRACTURE SURGERY Left 2000   per patient, he has rod inside his left leg.   VEIN HARVEST Left 01/09/2018   Procedure: LEFT SAPHENOUS VEIN HARVEST;  Surgeon: [place] Penne Lonni, MD;  Location: [facility] OR;  Service: Vascular;  Laterality: Left;     Social History:   reports that he has been smoking cigarettes. He has a 120 pack-year smoking history. He has never used smokeless tobacco. He reports that he does not currently use alcohol after a past usage of about 14.0 standard drinks of alcohol per week. He reports that he does not use drugs.   Family History:  His family history includes Cancer in his mother; Heart attack in his mother; Heart disease in his brother and father; Stroke in his sister.   Allergies No Known Allergies   Jennet Epley, MD [place] [facility] Pager: See Tracey

## 2024-03-29 NOTE — Evaluation (Signed)
 Physical Therapy Evaluation Patient Details Name: Stephen Bowen MRN: [id number] DOB: [birth year] Today's Date: 03/29/2024  History of Present Illness  Patient is a 68 y.o. male with PMH: HTN, dementia, encephalopathy, MI, HLD, PAD, CAD, daily tobacco use. He presented to the hospital on 03/27/2024 for biopsy of pharyngeal mass and tracheostomy placement. He had been having difficulty swallowing x10 days and mainly was drinking fluids for his nutrition. 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  Clinical Impression   Pt admitted with above diagnosis. Per chart review, lives at an institution (unsure if SNF level or ALD); Prior to admission, pt indicated he uses a wheelchiar for mobility, and that he sometimes needs assist for transferring; Presents to PT with a strong cough and lots of secretions, decr mobility, functional dependencies;  Was able to push self up to unsupported sitting and do some scooting in bed, mostly forward and backward; Shows good potential for functional gains and transfers at wheelchair level;  Pt currently with functional limitations due to the deficits listed below (see PT Problem List). Pt will benefit from skilled PT to increase their independence and safety with mobility to allow discharge to the venue listed below.           If plan is discharge home, recommend the following: Other (comment) (from SNF)   Can travel by private vehicle   No    Equipment Recommendations Wheelchair (measurements PT);Wheelchair cushion (measurements PT);Other (comment) (perhaps sliding board, drop-arm BSC)  Recommendations for Other Services       Functional Status Assessment Patient has had a recent decline in their functional status and demonstrates the ability to make significant improvements in function in a reasonable and predictable amount of time.     Precautions / Restrictions Precautions Precautions: Fall;Other (comment) Recall of Precautions/Restrictions:  Impaired Restrictions Weight Bearing Restrictions Per Provider Order: No      Mobility  Bed Mobility Overal bed mobility: Needs Assistance Bed Mobility: Supine to Sit, Sit to Supine     Supine to sit: Min assist (and used bed features) Sit to supine: Supervision   General bed mobility comments: put bed in chair position and pt was able to push self into unsuported sitting; able to press up with hands by hips for scooting    Transfers Overall transfer level: Needs assistance Equipment used: None Transfers: Bed to chair/wheelchair/BSC            Lateral/Scoot Transfers: Contact guard assist General transfer comment: Simulated lateral scooting in the bed; pt mostly limited by discomfort at trach    Ambulation/Gait                  Stairs            Wheelchair Mobility     Tilt Bed    Modified Rankin (Stroke Patients Only)       Balance Overall balance assessment: Needs assistance   Sitting balance-Leahy Scale: Fair Sitting balance - Comments: able to push from supported sitting to unsupported sitting in the bed                                     Pertinent Vitals/Pain Pain Assessment Pain Assessment: Faces Faces Pain Scale: Hurts little more Pain Location: some facial grimacing when coughing Pain Descriptors / Indicators: Grimacing, Discomfort Pain Intervention(s): Monitored during session    Home Living Family/patient expects to be discharged to:: Skilled nursing facility Living  Arrangements: Other (Comment) (Per chart review, from an institution; unsure if SNF or ALF level)                      Prior Function Prior Level of Function : Patient poor historian/Family not available             Mobility Comments: Indicates that he does not have prostheses and uses a wheelchair for mobility; assist as needed for transfer       Extremity/Trunk Assessment   Upper Extremity Assessment Upper Extremity Assessment:  Overall WFL for tasks assessed (for simple tasks)    Lower Extremity Assessment Lower Extremity Assessment: RLE deficits/detail;LLE deficits/detail RLE Deficits / Details: AKA; able to flex/extend hips, move actively in all planes LLE Deficits / Details: AKA; able to flex/extend hips, move actively in all planes       Communication   Communication Communication: Impaired Factors Affecting Communication: Other (comment) (Trach)    Cognition Arousal: Alert Behavior During Therapy: WFL for tasks assessed/performed, Anxious   PT - Cognitive impairments: History of cognitive impairments, No family/caregiver present to determine baseline, Difficult to assess Difficult to assess due to: Impaired communication                     PT - Cognition Comments: Made attempts to communicate and did well with yes/no questions; when offered paper and a marker he waved it away Following commands: Impaired Following commands impaired: Follows one step commands with increased time     Cueing Cueing Techniques: Verbal cues, Gestural cues, Tactile cues, Visual cues     General Comments General comments (skin integrity, edema, etc.): HR very tachy during session, ranged 136 bpm to 166 bpm when coughing; supplemental O2 via trach, 6L for 28% FiO2, and sats 99-100%; strong cough with lots of secretions    Exercises     Assessment/Plan    PT Assessment Patient needs continued PT services  PT Problem List Decreased strength;Decreased activity tolerance;Decreased balance;Decreased mobility;Decreased coordination;Decreased cognition;Decreased knowledge of use of DME;Decreased safety awareness;Decreased knowledge of precautions;Cardiopulmonary status limiting activity       PT Treatment Interventions DME instruction;Functional mobility training;Therapeutic activities;Therapeutic exercise;Balance training;Neuromuscular re-education;Cognitive remediation;Patient/family education;Wheelchair mobility  training;Manual techniques;Modalities    PT Goals (Current goals can be found in the Care Plan section)  Acute Rehab PT Goals Patient Stated Goal: Agreed to move in the bed this session PT Goal Formulation: Patient unable to participate in goal setting Time For Goal Achievement: 04/12/24 Potential to Achieve Goals: Good    Frequency Min 2X/week     Co-evaluation               AM-PAC PT 6 Clicks Mobility  Outcome Measure Help needed turning from your back to your side while in a flat bed without using bedrails?: A Little Help needed moving from lying on your back to sitting on the side of a flat bed without using bedrails?: A Little Help needed moving to and from a bed to a chair (including a wheelchair)?: A Lot Help needed standing up from a chair using your arms (e.g., wheelchair or bedside chair)?: Total Help needed to walk in hospital room?: Total Help needed climbing 3-5 steps with a railing? : Total 6 Click Score: 11    End of Session Equipment Utilized During Treatment: Oxygen Activity Tolerance: Patient tolerated treatment well Patient left: in bed;with call bell/phone within reach Nurse Communication: Mobility status PT Visit Diagnosis: Other abnormalities of gait and  mobility (R26.89);Muscle weakness (generalized) (M62.81)    Time: 8570-8544 PT Time Calculation (min) (ACUTE ONLY): 26 min   Charges:   PT Evaluation $PT Eval Moderate Complexity: 1 Mod PT Treatments $Therapeutic Activity: 8-22 mins PT General Charges $$ ACUTE PT VISIT: 1 Visit         Silvano Currier, PT  Acute Rehabilitation Services Office [phone] Secure Chat welcomed   Silvano VEAR Currier 03/29/2024, 4:55 PM

## 2024-03-30 ENCOUNTER — Encounter (HOSPITAL_COMMUNITY)
Admission: RE | Disposition: A | Discharge status: Skilled Nursing Facility | Payer: Self-pay | Source: Skilled Nursing Facility | Attending: Internal Medicine

## 2024-03-30 ENCOUNTER — Encounter (HOSPITAL_COMMUNITY): Payer: Self-pay | Admitting: Otolaryngology

## 2024-03-30 ENCOUNTER — Inpatient Hospital Stay (HOSPITAL_COMMUNITY): Admitting: Anesthesiology

## 2024-03-30 ENCOUNTER — Other Ambulatory Visit: Payer: Self-pay

## 2024-03-30 DIAGNOSIS — J392 Other diseases of pharynx: Secondary | ICD-10-CM

## 2024-03-30 DIAGNOSIS — I2510 Atherosclerotic heart disease of native coronary artery without angina pectoris: Secondary | ICD-10-CM

## 2024-03-30 DIAGNOSIS — F17210 Nicotine dependence, cigarettes, uncomplicated: Secondary | ICD-10-CM

## 2024-03-30 DIAGNOSIS — I10 Essential (primary) hypertension: Secondary | ICD-10-CM

## 2024-03-30 DIAGNOSIS — C140 Malignant neoplasm of pharynx, unspecified: Secondary | ICD-10-CM | POA: Diagnosis not present

## 2024-03-30 HISTORY — PX: CREATION, GASTROSTOMY, OPEN: SHX7546

## 2024-03-30 LAB — GLUCOSE, CAPILLARY
Glucose-Capillary: 84 mg/dL (ref 70–99)
Glucose-Capillary: 87 mg/dL (ref 70–99)
Glucose-Capillary: 79 mg/dL (ref 70–99)
Glucose-Capillary: 78 mg/dL (ref 70–99)
Glucose-Capillary: 78 mg/dL (ref 70–99)
Glucose-Capillary: 85 mg/dL (ref 70–99)
Glucose-Capillary: 100 mg/dL — ABNORMAL HIGH (ref 70–99)

## 2024-03-30 LAB — PHOSPHORUS: Phosphorus: 3.4 mg/dL (ref 2.5–4.6)

## 2024-03-30 LAB — BASIC METABOLIC PANEL WITH GFR
Anion gap: 10 (ref 5–15)
BUN: 8 mg/dL (ref 8–23)
CO2: 23 mmol/L (ref 22–32)
Calcium: 8.6 mg/dL — ABNORMAL LOW (ref 8.9–10.3)
Chloride: 104 mmol/L (ref 98–111)
Creatinine, Ser: 0.74 mg/dL (ref 0.61–1.24)
GFR, Estimated: >60 mL/min (ref >60)
Glucose, Bld: 82 mg/dL (ref 70–99)
Potassium: 4.2 mmol/L (ref 3.5–5.1)
Sodium: 137 mmol/L (ref 135–145)

## 2024-03-30 LAB — CBC
HCT: 37.6 % — ABNORMAL LOW (ref 39.0–52.0)
Hemoglobin: 12.2 g/dL — ABNORMAL LOW (ref 13.0–17.0)
MCH: 30.3 pg (ref 26.0–34.0)
MCHC: 32.4 g/dL (ref 30.0–36.0)
MCV: 93.3 fL (ref 80.0–100.0)
Platelets: 435 K/uL — ABNORMAL HIGH (ref 150–400)
RBC: 4.03 MIL/uL — ABNORMAL LOW (ref 4.22–5.81)
RDW: 13.6 % (ref 11.5–15.5)
WBC: 11 K/uL — ABNORMAL HIGH (ref 4.0–10.5)
nRBC: 0 % (ref 0.0–0.2)

## 2024-03-30 LAB — MAGNESIUM: Magnesium: 2 mg/dL (ref 1.7–2.4)

## 2024-03-30 MED ORDER — LACTATED RINGERS IV SOLN: INTRAVENOUS | Status: DC | PRN

## 2024-03-30 MED ORDER — SUGAMMADEX SODIUM 200 MG/2ML IV SOLN
INTRAVENOUS | Status: DC | PRN
  Administered 2024-03-30: 100 mg via INTRAVENOUS

## 2024-03-30 MED ORDER — MIDAZOLAM HCL 2 MG/2ML IJ SOLN
INTRAMUSCULAR | Status: AC
  Filled 2024-03-30: qty 2

## 2024-03-30 MED ORDER — DEXAMETHASONE SODIUM PHOSPHATE 10 MG/ML IJ SOLN
INTRAMUSCULAR | Status: DC | PRN
  Administered 2024-03-30: 5 mg via INTRAVENOUS

## 2024-03-30 MED ORDER — CEFAZOLIN SODIUM-DEXTROSE 2-4 GM/100ML-% IV SOLN
2.0000 g | Freq: Once | INTRAVENOUS | Status: AC
  Administered 2024-03-30: 1 g via INTRAVENOUS

## 2024-03-30 MED ORDER — PROPOFOL 10 MG/ML IV BOLUS
INTRAVENOUS | Status: AC
  Filled 2024-03-30: qty 20

## 2024-03-30 MED ORDER — DEXAMETHASONE SODIUM PHOSPHATE 10 MG/ML IJ SOLN
INTRAMUSCULAR | Status: AC
  Filled 2024-03-30: qty 1

## 2024-03-30 MED ORDER — FENTANYL CITRATE (PF) 100 MCG/2ML IJ SOLN
INTRAMUSCULAR | Status: DC | PRN
  Administered 2024-03-30 (×2): 50 ug via INTRAVENOUS

## 2024-03-30 MED ORDER — DROPERIDOL 2.5 MG/ML IJ SOLN: 0.6250 mg | Freq: Once | INTRAMUSCULAR | Status: DC | PRN

## 2024-03-30 MED ORDER — ROCURONIUM BROMIDE 100 MG/10ML IV SOLN
INTRAVENOUS | Status: DC | PRN
  Administered 2024-03-30: 20 mg via INTRAVENOUS

## 2024-03-30 MED ORDER — ROCURONIUM BROMIDE 10 MG/ML (PF) SYRINGE
PREFILLED_SYRINGE | INTRAVENOUS | Status: AC
Start: 2024-03-30 — End: 2024-03-30
  Filled 2024-03-30: qty 10

## 2024-03-30 MED ORDER — PHENYLEPHRINE HCL-NACL 20-0.9 MG/250ML-% IV SOLN
INTRAVENOUS | Status: DC | PRN
  Administered 2024-03-30: 20 ug/min via INTRAVENOUS

## 2024-03-30 MED ORDER — METHOCARBAMOL 1000 MG/10ML IJ SOLN
500.0000 mg | Freq: Three times a day (TID) | INTRAMUSCULAR | Status: DC
Start: 2024-03-30 — End: 2024-04-03
  Administered 2024-03-30 – 2024-04-02 (×11): 500 mg via INTRAVENOUS
  Filled 2024-03-30 (×12): qty 10

## 2024-03-30 MED ORDER — FENTANYL CITRATE (PF) 100 MCG/2ML IJ SOLN
25.0000 ug | INTRAMUSCULAR | Status: DC | PRN
  Administered 2024-03-30: 25 ug via INTRAVENOUS

## 2024-03-30 MED ORDER — ONDANSETRON HCL 4 MG/2ML IJ SOLN
INTRAMUSCULAR | Status: AC
  Filled 2024-03-30: qty 2

## 2024-03-30 MED ORDER — FENTANYL CITRATE (PF) 250 MCG/5ML IJ SOLN
INTRAMUSCULAR | Status: AC
  Filled 2024-03-30: qty 5

## 2024-03-30 MED ORDER — HYDRALAZINE HCL 20 MG/ML IJ SOLN: 5.0000 mg | INTRAMUSCULAR | Status: DC | PRN

## 2024-03-30 MED ORDER — CEFAZOLIN SODIUM-DEXTROSE 2-4 GM/100ML-% IV SOLN
INTRAVENOUS | Status: AC
  Filled 2024-03-30: qty 100

## 2024-03-30 MED ORDER — FENTANYL CITRATE (PF) 100 MCG/2ML IJ SOLN
INTRAMUSCULAR | Status: AC
  Filled 2024-03-30: qty 2

## 2024-03-30 MED ORDER — LIDOCAINE 2% (20 MG/ML) 5 ML SYRINGE
INTRAMUSCULAR | Status: AC
  Filled 2024-03-30: qty 5

## 2024-03-30 MED ORDER — 0.9 % SODIUM CHLORIDE (POUR BTL) OPTIME
TOPICAL | Status: DC | PRN
  Administered 2024-03-30: 2000 mL

## 2024-03-30 MED ORDER — PHENYLEPHRINE HCL (PRESSORS) 10 MG/ML IV SOLN
INTRAVENOUS | Status: DC | PRN
  Administered 2024-03-30: 160 ug via INTRAVENOUS

## 2024-03-30 MED ORDER — ONDANSETRON HCL 4 MG/2ML IJ SOLN
INTRAMUSCULAR | Status: DC | PRN
Start: 2024-03-30 — End: 2024-03-30
  Administered 2024-03-30: 4 mg via INTRAVENOUS

## 2024-03-30 MED ORDER — LACTATED RINGERS IV SOLN
INTRAVENOUS | Status: AC
Start: 2024-03-30 — End: 2024-03-31

## 2024-03-30 MED ORDER — ACETAMINOPHEN 10 MG/ML IV SOLN
1000.0000 mg | Freq: Four times a day (QID) | INTRAVENOUS | Status: AC
  Administered 2024-03-30 – 2024-03-31 (×4): 1000 mg via INTRAVENOUS
  Filled 2024-03-30 (×4): qty 100

## 2024-03-30 MED ORDER — PROPOFOL 10 MG/ML IV BOLUS
INTRAVENOUS | Status: DC | PRN
  Administered 2024-03-30: 20 mg via INTRAVENOUS
  Administered 2024-03-30: 30 mg via INTRAVENOUS
  Administered 2024-03-30: 50 mg via INTRAVENOUS

## 2024-03-30 NOTE — Progress Notes (Signed)
 Speech Language Pathology Treatment: [name] Speaking valve  Patient Details Name: Stephen Bowen MRN: [id number] DOB: 12-17-1955 Today's Date: 03/30/2024 Time: 8956-8883 SLP Time Calculation (min) (ACUTE ONLY): 33 min  Assessment / Plan / Recommendation Clinical Impression  Pt demonstrated overall improvements with PMV from initial assessment yesterday. He was calm, cooperative and not anxious. No audible pharyngeal secretions present- stated he was suctioned this am. Cuff deflated on arrival and PMV donned and wore for 20 minutes with valve falling off trach hub once. SLP removed valve periodically with no back pressure present, no increase in work of breathing. HR 116, SpO2 100% and RR 25-28. Adequate respiratory support during verbalization without spontaneous speech however imitated sentences with adequate intensity, intelligible and mildly hoarse vocal quality. No pharyngeal/laryngeal secretions present. His volitional cough is weak. He remained calm throughout session and stating his breathing was better. SLP recommends pt wear valve with staff and therapies with full supervision, ensure cuff deflation and doff valve when leaving room. RN present at end of session and verbally, visually provided education. Pt could have periods of decreased toleration if congested, with audible pharyngeal secretions and may need deep suctioning with RN and/or not use valve with staff/therapies at that time. SLP will continue to treat.    HPI  Patient is a 68 y.o. male with PMH: HTN, dementia, encephalopathy, MI, HLD, PAD, CAD, daily tobacco use. He presented to the hospital on 03/27/2024 for biopsy of pharyngeal mass and tracheostomy placement. He had been having difficulty swallowing x10 days and mainly was drinking fluids for his nutrition. 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 ENT following and recommended cuff deflation trial on 10/4. SLP ordered for PMV and swallow evaluation. Respiratory deflated patient's cuff  on 10/5 in AM and patient tolerated this well.       SLP Plan  Continue with current plan of care          Recommendations         Patient may use Passy-Muir Speech Valve: During all therapies with supervision PMSV Supervision: Full MD: Please consider changing trach tube to : Cuffless           Oral care QID   Intermittent Supervision/Assistance Aphonia (R49.1)     Continue with current plan of care     Dustin Olam Bull  03/30/2024, 11:30 AM

## 2024-03-30 NOTE — Progress Notes (Addendum)
 Initial Nutrition Assessment  DOCUMENTATION CODES:   Severe malnutrition in context of chronic illness  INTERVENTION:  When appropriate, initiate tube feeding via PEG: Osmolite 1.2 at 55 ml/h (1320 ml per day) Initiate at 13ml/hr and increase by 10ml/hr q8 hours until goal rate achieved FWF 100 ml q6h (400 ml per day) Monitor magnesium , potassium, and phosphorus daily for at least 3 days, MD to replete as needed Add Thiamine  100 mg daily for 5 days   Provides 1584 kcal, 73 gm protein, 1482 ml free water daily   Add MVI w/ minerals  Monitor SLP notes regarding MBSS and diet advancement and assess need to modify to supplemental and/or nocturnal  May need modification to scheduled bowel regimen - no BM x3 days  NUTRITION DIAGNOSIS:   Severe Malnutrition related to chronic illness as evidenced by severe fat depletion, severe muscle depletion, percent weight loss.  GOAL:  Patient will meet greater than or equal to 90% of their needs  MONITOR:  PO intake, Diet advancement, Supplement acceptance, Skin, TF tolerance, Weight trends, Labs  REASON FOR ASSESSMENT:  Consult Enteral/tube feeding initiation and management  ASSESSMENT:   Pt with PMH significant for: HTN, PAD, bilateral AKAs, CAD, HLD, diabetes mellitus, MI, dementia, CKD II. Admitted for tracheostomy placement and biopsy of pharyngeal mass. Has been with prolonged difficulty swallowing and some hemoptysis.  10/03 - admitted; tracheostomy placed; laryngoscopy and biopsy 10/04 - transitioned to trach collar 10/05 - transferred out of ICU 10/06 - PEG placed  Patient scheduled for PEG tube placement today for nutrition support given inability to swallow as well as cancer dx. Speech working with patient as RD arrived. Working with PMSV and patient tolerating. Able to produce verbalized speech.  Speech noting he will need modified barium swallow study to assess level of dysphagia. Even if diet advanced, patient will benefit  from supplemental nutrition support given his elevated calorie and protein needs 2/2 cancer dx w/ treatment as well as severely malnourished state.   Spoke with patient who is somewhat confused and likely an unreliable historian. Having difficulty contacting patient family. Despite this, he verbalizes poor intake for some time. Suspect this is true due to visualized muscle and fat depletions as well as lab trends. Has required potassium and magnesium  repletion since admission.   Admit Weight: 51.1 kg - appears pulled forward from last admission Current Weight: 42.7 kg  He cannot report UBW. Per chart review, has shown 16.4% weight loss since last admission one month ago. This is clinically significant. No new weights before that time as last ones collected were in 2022. Noted to be 38.8 kg back in 2018-2019. No edema on exam.  No BM noted in three days. Bowel regimen in place. Monitor for the need to modify regimen.  Drains/Lines: Tracheostomy: shiley 6mm cuffed (placed 10/3) LLQ: PEG (placed 10/6) UOP: 1250 ml x24 hours  High risk for refeeding due inability to confirm level of intake PTA as well as malnourished appearance and lab trends reviewed.   Meds: docusate sodium , famotidine, SS Novolog  0-9 q4h, Miralax , IV ABX   Labs:  Na+ 137 (wdl) K+ 3.1>4.2 (wdl) PHOS 3.1>3.4 (wdl) Mg 1.4>2.0 (wdl) CBGs 82-360 x24 hours A1c 5.9 (03/2024)   NUTRITION - FOCUSED PHYSICAL EXAM: Muscle and fat depletions indicative of severely malnourished state 2/2 inability to swallow, cancer dx, and chronic comorbidities.   Flowsheet Row Most Recent Value  Orbital Region Moderate depletion  Upper Arm Region Severe depletion  Thoracic and Lumbar Region Severe depletion  Buccal Region Severe depletion  Temple Region Severe depletion  Clavicle Bone Region Severe depletion  Clavicle and Acromion Bone Region Severe depletion  Scapular Bone Region Severe depletion  Dorsal Hand Moderate depletion   Patellar Region Unable to assess  Anterior Thigh Region Unable to assess  Posterior Calf Region Unable to assess  Edema (RD Assessment) None  Hair Reviewed  Eyes Reviewed  Mouth Reviewed  [poor dentition w/ missing teeth]  Skin Reviewed  Nails Reviewed    Diet Order:   Diet Order             Diet NPO time specified  Diet effective now             EDUCATION NEEDS:   Not appropriate for education at this time  Skin:  Skin Assessment: Skin Integrity Issues: Skin Integrity Issues:: DTI DTI: coccyx  Last BM:  10/3  Height:  Ht Readings from Last 1 Encounters:  03/30/24 5' 6 (1.676 m)   Weight:  Wt Readings from Last 1 Encounters:  03/30/24 42.7 kg    Ideal Body Weight:  43.8 kg (estimated taking into account bilateral AKA)  BMI:  Body mass index is 15.19 kg/m.  Estimated Nutritional Needs:   Kcal:  1500-1700 kcals  Protein:  70-85g  Fluid:  1.5-1.7L/day  Blair Deaner MS, RD, LDN Registered Dietitian Clinical Nutrition RD Inpatient Contact Info in Amion

## 2024-03-30 NOTE — Op Note (Signed)
 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 Requested to place tube feeding access.  Procedure: After informed consent was obtained  he was taken to the OR. Placed under general anesthesia without complication.  Antibiotics were given. SCDs in place.  Surgical timeout performed.  I made a limited midline incision. I carried this through the fascia and entered the peritoneum without difficulty. I identified that stomach. I picked a point on the greater curvature.  I then placed 2 2-0 pursestring sutures. I then picked a point in the LUQ that I inserted the tube through. I then made a gastrotomy and inserted the tube. I tightened my pursestring sutures down. I then pulled the tube up to the abdominal wall. I then tacked the stomach to the abdominal wall with 2-0 silk sutures in Stamm fashion.  I closed the fascia with #1 PDS. Skin was closed with 4-0 monocryl and glue.  Tolerated well and transferred to recovery stable.

## 2024-03-30 NOTE — Consult Note (Addendum)
 Stephen Bowen 04/22/1956  [id number].    Requesting MD: Sherlon, MD  Chief Complaint/Reason for Consult: gastrostomy tube placement   HPI:  Stephen Bowen is a 68 y/o M with a PMH of PAD status post bilateral AKA, hyperlipidemia, hypertension, diabetes, CKD, and daily tobacco use who was admitted for biopsy of pharyngeal mass and tracheostomy placement on 10/3.  Intraoperative frozen pathology revealed SCC, final path pending.  General surgery has been consulted for gastrostomy tube placement in the setting of malnutrition and inability to tolerate p.o. intake related to pharyngeal mass.  He is working on [employer] trials with speech, at present he is not able to swallow safely.  ROS: Review of Systems  All other systems reviewed and are negative.   Family History  Problem Relation Age of Onset   Cancer Mother        deceased in 80s    Heart attack Mother        diseased   Heart disease Father    Stroke Sister        age 61s   Heart disease Brother        pacemaker in his 63s    Past Medical History:  Diagnosis Date   CKD (chronic kidney disease), stage II    Coronary artery disease    Dementia (HCC)    Diabetes mellitus without complication (HCC)    Encephalopathy acute 07/2016   HTN (hypertension)    Hyperlipemia    Myocardial infarction (HCC)    Non-healing surgical wound    left AKA   Noncompliance    PAD (peripheral artery disease)    a.  s/p left femoral to PT artery bypass with propaten on 02/16/16.   S/P AKA (above knee amputation) bilateral (HCC)    Tobacco abuse     Past Surgical History:  Procedure Laterality Date   ABDOMINAL AORTOGRAM W/LOWER EXTREMITY N/A 08/02/2016   Procedure: Abdominal Aortogram w/Lower Extremity;  Surgeon: Penne Lonni Colorado, MD;  Location: [facility] INVASIVE CV LAB;  Service: Cardiovascular;  Laterality: N/A;   ABOVE KNEE LEG AMPUTATION Right 12/04/2016   AMPUTATION Left 06/25/2016   Procedure: AMPUTATION ABOVE KNEE;  Surgeon:  Gaile LELON New, MD;  Location: [facility] OR;  Service: Vascular;  Laterality: Left;   AMPUTATION Right 12/04/2016   Procedure: RIGHT AMPUTATION ABOVE KNEE;  Surgeon: Colorado Penne Lonni, MD;  Location: [facility] OR;  Service: Vascular;  Laterality: Right;   AMPUTATION Left 10/08/2017   Procedure: LEFT ABOVE KNEE AMPUTATION REVISION;  Surgeon: Colorado Penne Lonni, MD;  Location: [facility] OR;  Service: Vascular;  Laterality: Left;   APPLICATION OF WOUND VAC Left 02/09/2021   Procedure: APPLICATION OF WOUND VAC;  Surgeon: Colorado Penne Lonni, MD;  Location: [facility] OR;  Service: Vascular;  Laterality: Left;   BYPASS GRAFT POPLITEAL TO TIBIAL Left 06/21/2016   Procedure: BYPASS GRAFT POPLITEAL TO TIBIAL USING GORE PROPATEN GRAFT;  Surgeon: Penne Lonni Colorado, MD;  Location: [facility] OR;  Service: Vascular;  Laterality: Left;   EMBOLECTOMY Left 06/21/2016   Procedure: EMBOLECTOMY left lower extremity.;  Surgeon: Penne Lonni Colorado, MD;  Location: [facility] OR;  Service: Vascular;  Laterality: Left;   ENDARTERECTOMY FEMORAL Left 02/16/2016   Procedure: LEFT FEMORAL ENDARTERECTOMY;  Surgeon: Penne Lonni Colorado, MD;  Location: [facility] OR;  Service: Vascular;  Laterality: Left;   FEMORAL BYPASS  02/16/2016   LEFT FEMORAL-POSTERIOR TIBIAL ARTERY BYPASS  WITH PROPATEN 6 MM X 80 CM VASCULAR RING GRAFT (Left)   FEMORAL-TIBIAL BYPASS  GRAFT Left 02/16/2016   Procedure: LEFT FEMORAL-POSTERIOR TIBIAL ARTERY BYPASS  WITH PROPATEN 6 MM X 80 CM VASCULAR RING GRAFT;  Surgeon: Penne Lonni Colorado, MD;  Location: [facility] OR;  Service: Vascular;  Laterality: Left;   FRACTURE SURGERY     I & D EXTREMITY Left 09/03/2016   Procedure: IRRIGATION AND DEBRIDEMENT EXTREMITY - LEFT AKA;  Surgeon: Penne Lonni Colorado, MD;  Location: [facility] OR;  Service: Vascular;  Laterality: Left;   I & D EXTREMITY Left 01/09/2018   Procedure: IRRIGATION AND DEBRIDEMENT GROIN;  Surgeon: Colorado Penne Lonni, MD;  Location: [facility] OR;  Service: Vascular;   Laterality: Left;   INCISION AND DRAINAGE Left 02/09/2021   Procedure: INCISION AND DRAINAGE OF LEFT GROIN WOUND;  Surgeon: Colorado Penne Lonni, MD;  Location: [facility] OR;  Service: Vascular;  Laterality: Left;   MICROLARYNGOSCOPY WITH LASER N/A 03/27/2024   Procedure: DIRECT MICROLARYNGOSCOPY  AND BIOPSY;  Surgeon: Okey Burns, MD;  Location: [facility] OR;  Service: ENT;  Laterality: N/A;   PATCH ANGIOPLASTY Left 02/16/2016   Procedure: PATCH ANGIOPLASTY WITH GEORGE BIOLOGIC PATCH 1 CM X 6 CM;  Surgeon: Penne Lonni Colorado, MD;  Location: [facility] OR;  Service: Vascular;  Laterality: Left;   PATCH ANGIOPLASTY Left 01/09/2018   Procedure: PATCH ANGIOPLASTY OF LEFT COMMON FEMORAL AND PROFUNDA  USING SAPHENOUS VEIN;  Surgeon: Colorado Penne Lonni, MD;  Location: [facility] OR;  Service: Vascular;  Laterality: Left;   PERIPHERAL VASCULAR BALLOON ANGIOPLASTY Right 08/02/2016   Procedure: Peripheral Vascular Balloon Angioplasty;  Surgeon: Penne Lonni Colorado, MD;  Location: [facility] INVASIVE CV LAB;  Service: Cardiovascular;  Laterality: Right;  peroneal artery   PERIPHERAL VASCULAR CATHETERIZATION N/A 07/07/2015   Procedure: Abdominal Aortogram w/Lower Extremity;  Surgeon: Redell LITTIE Door, MD;  Location: [facility] INVASIVE CV LAB;  Service: Cardiovascular;  Laterality: N/A;   PERIPHERAL VASCULAR CATHETERIZATION N/A 02/15/2016   Procedure: Abdominal Aortogram w/Lower Extremity;  Surgeon: Penne Lonni Colorado, MD;  Location: [facility] INVASIVE CV LAB;  Service: Cardiovascular;  Laterality: N/A;   PERIPHERAL VASCULAR CATHETERIZATION Bilateral 06/20/2016   Procedure: Lower Extremity Angiography;  Surgeon: Penne Lonni Colorado, MD;  Location: [facility] INVASIVE CV LAB;  Service: Cardiovascular;  Laterality: Bilateral;  limited runoff on rt leg thrombolysis lt leg bypass graft   PERIPHERAL VASCULAR INTERVENTION Right 08/02/2016   Procedure: Peripheral Vascular Intervention;  Surgeon: Penne Lonni Colorado, MD;  Location:  [facility] INVASIVE  CV LAB;  Service: Cardiovascular;  Laterality: Right;  Femoral , popliteal   TIBIA FRACTURE SURGERY Left 2000   per patient, he has rod inside his left leg.   TRACHEOSTOMY TUBE PLACEMENT N/A 03/27/2024   Procedure: CREATION, TRACHEOSTOMY;  Surgeon: Okey Burns, MD;  Location: [facility] OR;  Service: ENT;  Laterality: N/A;   VEIN HARVEST Left 01/09/2018   Procedure: LEFT SAPHENOUS VEIN HARVEST;  Surgeon: Colorado Penne Lonni, MD;  Location: [facility] OR;  Service: Vascular;  Laterality: Left;    Social History:  reports that he has been smoking cigarettes. He has a 120 pack-year smoking history. He has never used smokeless tobacco. He reports that he does not currently use alcohol after a past usage of about 14.0 standard drinks of alcohol per week. He reports that he does not use drugs.  Allergies: No Known Allergies  Medications Prior to Admission  Medication Sig Dispense Refill   aspirin  EC 81 MG EC tablet Take 1 tablet (81 mg total) by mouth daily.     atorvastatin  (LIPITOR ) 40 MG tablet Take 1 tablet (40 mg  total) by mouth daily at 6 PM. 30 tablet 0   bisacodyl  (DULCOLAX) 10 MG suppository Place 10 mg rectally as needed for moderate constipation.     docusate sodium  (COLACE) 100 MG capsule Take 1 capsule (100 mg total) by mouth daily. (Patient taking differently: Take 100 mg by mouth daily as needed for moderate constipation.) 10 capsule 0   gabapentin  (NEURONTIN ) 100 MG capsule Take 100 mg by mouth 3 (three) times daily.     levETIRAcetam (KEPPRA) 100 MG/ML solution Take 500 mg by mouth 2 (two) times daily.     lidocaine  (XYLOCAINE ) 2 % solution Use as directed 5 mLs in the mouth or throat 3 (three) times daily.     lisinopril  (ZESTRIL ) 5 MG tablet Take 5 mg by mouth daily.     metFORMIN (GLUCOPHAGE) 500 MG tablet Take 500 mg by mouth daily.     metoprolol  succinate (TOPROL -XL) 25 MG 24 hr tablet Take 25 mg by mouth daily.     Metoprolol  Tartrate 37.5 MG TABS Take 1 tablet by mouth 2 (two)  times daily.     phenol (CHLORASEPTIC) 1.4 % LIQD Use as directed 2 sprays in the mouth or throat every 4 (four) hours as needed for throat irritation / pain.     fenofibrate micronized (LOFIBRA) 67 MG capsule Take 67 mg by mouth daily. (Patient not taking: Reported on 03/25/2024)     levETIRAcetam (KEPPRA) 500 MG tablet Take 500 mg by mouth 2 (two) times daily. (Patient not taking: Reported on 03/25/2024)     metoprolol  succinate (TOPROL -XL) 50 MG 24 hr tablet Take 50 mg by mouth daily. (Patient not taking: Reported on 03/25/2024)       Physical Exam: Blood pressure 104/66, pulse (!) 111, temperature 98.5 F (36.9 C), resp. rate 15, height 5' 6 (1.676 m), weight 42.7 kg, SpO2 98%. General: Pleasant male, appears chronically ill, laying on hospital bed,  NAD. HEENT: head -normocephalic, atraumatic; Eyes: PERRLA, no conjunctival injection Neck- Trachea is midline, trach in place, on trach collar, copious thick secretions are present. CV- RRR, normal S1/S2, no M/R/G, radial and dorsalis pedis pulses 2+ BL, cap refill < 2 seconds. Pulm- breathing is non-labored.  On trach collar Abd- soft, NT/ND, no surgical scars appreciated, no rebound tenderness, no palpable organomegaly GU-external catheter in place MSK-upper extremities are symmetrical, bilateral AKA's present Neuro- CN II-XII grossly in tact, no paresthesias. Psych- Alert and Oriented x3 with appropriate affect Skin: warm and dry, no rashes or lesions   Results for orders placed or performed during the hospital encounter of 03/27/24 (from the past 48 hours)  Glucose, capillary     Status: None   Collection Time: 03/28/24  3:12 PM  Result Value Ref Range   Glucose-Capillary 76 70 - 99 mg/dL    Comment: Glucose reference range applies only to samples taken after fasting for at least 8 hours.  Glucose, capillary     Status: None   Collection Time: 03/28/24  7:11 PM  Result Value Ref Range   Glucose-Capillary 92 70 - 99 mg/dL     Comment: Glucose reference range applies only to samples taken after fasting for at least 8 hours.  Glucose, capillary     Status: None   Collection Time: 03/28/24 11:26 PM  Result Value Ref Range   Glucose-Capillary 89 70 - 99 mg/dL    Comment: Glucose reference range applies only to samples taken after fasting for at least 8 hours.  Basic metabolic panel with GFR  Status: Abnormal   Collection Time: 03/29/24  3:09 AM  Result Value Ref Range   Sodium 131 (L) 135 - 145 mmol/L   Potassium 3.1 (L) 3.5 - 5.1 mmol/L   Chloride 100 98 - 111 mmol/L   CO2 23 22 - 32 mmol/L   Glucose, Bld 360 (H) 70 - 99 mg/dL    Comment: Glucose reference range applies only to samples taken after fasting for at least 8 hours.   BUN 5 (L) 8 - 23 mg/dL   Creatinine, Ser 9.26 0.61 - 1.24 mg/dL   Calcium  8.1 (L) 8.9 - 10.3 mg/dL   GFR, Estimated >39 >39 mL/min    Comment: (NOTE) Calculated using the CKD-EPI Creatinine Equation (2021)    Anion gap 8 5 - 15    Comment: Performed at [facility] Lab, [address], [place], [place] [address]  Phosphorus     Status: None   Collection Time: 03/29/24  3:09 AM  Result Value Ref Range   Phosphorus 3.1 2.5 - 4.6 mg/dL    Comment: Performed at [facility] Lab, [address] [place], [place] [address]  Magnesium      Status: Abnormal   Collection Time: 03/29/24  3:09 AM  Result Value Ref Range   Magnesium  1.4 (L) 1.7 - 2.4 mg/dL    Comment: Performed at [facility] Lab, [address] [place], [place] [address]  CBC     Status: Abnormal   Collection Time: 03/29/24  3:09 AM  Result Value Ref Range   WBC 9.1 4.0 - 10.5 K/uL   RBC 3.62 (L) 4.22 - 5.81 MIL/uL   Hemoglobin 10.8 (L) 13.0 - 17.0 g/dL   HCT 66.5 (L) 60.9 - 47.9 %   MCV 92.3 80.0 - 100.0 fL   MCH 29.8 26.0 - 34.0 pg   MCHC 32.3 30.0 - 36.0 g/dL   RDW 86.1 88.4 - 84.4 %   Platelets 409 (H) 150 - 400 K/uL   nRBC 0.0 0.0 - 0.2 %    Comment: Performed at [facility] Lab, [address] [place], [place] [address]  Glucose, capillary     Status: None   Collection Time: 03/29/24  3:22 AM  Result Value Ref Range   Glucose-Capillary 96 70 - 99 mg/dL    Comment: Glucose reference range applies only to samples taken after fasting for at least 8 hours.  Glucose, capillary     Status: None   Collection Time: 03/29/24  7:38 AM  Result Value Ref Range   Glucose-Capillary 84 70 - 99 mg/dL    Comment: Glucose reference range applies only to samples taken after fasting for at least 8 hours.  Glucose, capillary     Status: None   Collection Time: 03/29/24 11:11 AM  Result Value Ref Range   Glucose-Capillary 74 70 - 99 mg/dL    Comment: Glucose reference range applies only to samples taken after fasting for at least 8 hours.  Glucose, capillary     Status: Abnormal   Collection Time: 03/29/24  3:23 PM  Result Value Ref Range   Glucose-Capillary 104 (H) 70 - 99 mg/dL    Comment: Glucose reference range applies only to samples taken after fasting for at least 8 hours.  Glucose, capillary     Status: None   Collection Time: 03/29/24  7:56 PM  Result Value Ref Range   Glucose-Capillary 93 70 - 99 mg/dL    Comment: Glucose reference range applies  only to samples taken after fasting for at least 8 hours.  Glucose, capillary     Status: None   Collection Time: 03/30/24  1:00 AM  Result Value Ref Range   Glucose-Capillary 84 70 - 99 mg/dL    Comment: Glucose reference range applies only to samples taken after fasting for at least 8 hours.  Glucose, capillary     Status: None   Collection Time: 03/30/24  3:25 AM  Result Value Ref Range   Glucose-Capillary 87 70 - 99 mg/dL    Comment: Glucose reference range applies only to samples taken after fasting for at least 8 hours.  Basic metabolic panel with GFR     Status: Abnormal   Collection Time: 03/30/24  3:42 AM  Result Value Ref Range   Sodium 137 135 - 145 mmol/L   Potassium 4.2 3.5 - 5.1 mmol/L   Chloride 104 98 - 111 mmol/L   CO2  23 22 - 32 mmol/L   Glucose, Bld 82 70 - 99 mg/dL    Comment: Glucose reference range applies only to samples taken after fasting for at least 8 hours.   BUN 8 8 - 23 mg/dL   Creatinine, Ser 9.25 0.61 - 1.24 mg/dL   Calcium  8.6 (L) 8.9 - 10.3 mg/dL   GFR, Estimated >39 >39 mL/min    Comment: (NOTE) Calculated using the CKD-EPI Creatinine Equation (2021)    Anion gap 10 5 - 15    Comment: Performed at [facility], [address], [place], [place] [address]  Phosphorus     Status: None   Collection Time: 03/30/24  3:42 AM  Result Value Ref Range   Phosphorus 3.4 2.5 - 4.6 mg/dL    Comment: Performed at [facility], [address] [place], [place] [address]  Magnesium      Status: None   Collection Time: 03/30/24  3:42 AM  Result Value Ref Range   Magnesium  2.0 1.7 - 2.4 mg/dL    Comment: Performed at [facility] Lab, [address], [place], [place] [address]  CBC     Status: Abnormal   Collection Time: 03/30/24  3:42 AM  Result Value Ref Range   WBC 11.0 (H) 4.0 - 10.5 K/uL   RBC 4.03 (L) 4.22 - 5.81 MIL/uL   Hemoglobin 12.2 (L) 13.0 - 17.0 g/dL   HCT 62.3 (L) 60.9 - 47.9 %   MCV 93.3 80.0 - 100.0 fL   MCH 30.3 26.0 - 34.0 pg   MCHC 32.4 30.0 - 36.0 g/dL   RDW 86.3 88.4 - 84.4 %   Platelets 435 (H) 150 - 400 K/uL   nRBC 0.0 0.0 - 0.2 %    Comment: Performed at [facility], [address] [place], [place] [address]  Glucose, capillary     Status: None   Collection Time: 03/30/24  8:10 AM  Result Value Ref Range   Glucose-Capillary 79 70 - 99 mg/dL    Comment: Glucose reference range applies only to samples taken after fasting for at least 8 hours.   CT ANGIO NECK W OR WO CONTRAST Result Date: 03/29/2024 EXAM: CTA Neck 03/29/2024 05:39:44 AM TECHNIQUE: CT of the neck was performed without and with the administration of 75 mL of iohexol (OMNIPAQUE) 350 MG/ML injection. Multiplanar 2D and/or 3D reformatted images are provided for review. Automated  exposure control, iterative reconstruction, and/or weight based adjustment of the mA/kV was utilized to reduce the radiation dose to  as low as reasonably achievable. Stenosis of the internal carotid arteries measured using NASCET criteria. COMPARISON: CT of the neck dated 02/27/2024 and PET/CT fusion study dated 03/09/2024. CLINICAL HISTORY: Pharyngeal cancer, oral bleeding need to evaluate for major vessel invasion by the tumor extravasation. FINDINGS: AORTIC ARCH AND ARCH VESSELS: No dissection or arterial injury. No significant stenosis of the brachiocephalic or subclavian arteries. CERVICAL CAROTID ARTERIES: No dissection, arterial injury, or hemodynamically significant stenosis by NASCET criteria. No encasement of the carotid arteries or proximal branches of the external carotid arteries. CERVICAL VERTEBRAL ARTERIES: No dissection, arterial injury, or significant stenosis. LUNGS AND MEDIASTINUM: Since the previous studies, a tracheostomy tube has been placed and its tip is in an appropriate position within the trachea. SOFT TISSUES: There is nodular thickening and enhancement again demonstrated within the posterior pharyngeal wall of the oral pharynx and hypopharynx bilaterally, more extensive on the left. There is no evidence of active bleeding. BONES: No acute abnormality. IMPRESSION: 1. Nodular thickening and enhancement within the posterior pharyngeal wall of the oral and hypopharynx bilaterally, more extensive on the left, without evidence of active bleeding or major vessel invasion 2. Tracheostomy tube in appropriate position within the trachea Electronically signed by: Evalene Coho MD 03/29/2024 06:38 AM EDT RP Workstation: [id number]   CT ABDOMEN WO CONTRAST Result Date: 03/29/2024 CLINICAL DATA:  Assess G-tube placement. Head and neck cancer. * Tracking Code: BO * EXAM: CT ABDOMEN WITHOUT CONTRAST TECHNIQUE: Multidetector CT imaging of the abdomen was performed following the standard protocol  without IV contrast. RADIATION DOSE REDUCTION: This exam was performed according to the departmental dose-optimization program which includes automated exposure control, adjustment of the mA and/or kV according to patient size and/or use of iterative reconstruction technique. COMPARISON:  PET-CT 03/09/2024 FINDINGS: Image quality degraded by beam hardening artifact emanating from retained barium contrast material in the colon . Lower chest: There is debris in the bronchus intermedius and left lower lobe airway. Central collapse/consolidative opacity is seen in the left upper lobe (image 9/series 6 with peripheral airway impaction seen in the posterior lower lobes bilaterally. Dependent atelectasis noted in both lung bases with trace bilateral pleural effusions. Hepatobiliary: No suspicious focal abnormality in the liver on this study without intravenous contrast. Gallbladder is distended. Tiny calcified gallstones evident. Porta hepatis, pancreatic head, and descending duodenum obscured by beam hardening artifact. Pancreas: Pancreas essentially obscured by beam hardening artifact from retained barium contrast material in the colon. Spleen: No splenomegaly. No suspicious focal mass lesion. Adrenals/Urinary Tract: No adrenal nodule or mass. Right kidney unremarkable. Tiny nonobstructing stone identified lower pole left kidney. Stomach/Bowel: Stomach is nondistended in largely obscured by artifact. As above, descending duodenum is obscured. No small bowel dilatation within the visualized abdomen. Diffuse colonic distension noted with retained contrast material. Vascular/Lymphatic: There is moderate atherosclerotic calcification of the abdominal aorta without aneurysm. No retroperitoneal lymphadenopathy. Gastrohepatic and hepatoduodenal ligaments are largely obscured by beam hardening artifact. Other: No intraperitoneal free fluid. Musculoskeletal: No worrisome lytic or sclerotic osseous abnormality. Old posterior left  rib fractures evident. Compression deformity at T11 and T12 is similar to chest CT of 02/20/2024. IMPRESSION: 1. Image quality degraded by beam hardening artifact emanating from retained barium contrast material in the colon. 2. Debris in the bronchus intermedius and left lower lobe airway with central collapse/consolidative opacity in the left upper lobe. Imaging features concerning for aspiration with associated infectious/inflammatory consolidative opacity in the central left lower lobe. 3. Stomach is nondistended and largely obscured by artifact. 4. Diffuse  colonic distension with retained contrast material. 5. Cholelithiasis. 6. Tiny nonobstructing stone lower pole left kidney. 7.  Aortic Atherosclerosis (ICD10-I70.0). Electronically Signed   By: Camellia Candle M.D.   On: 03/29/2024 06:02      Assessment/Plan 68 year old male with head and neck squamous cell carcinoma status post biopsy and tracheostomy 10/3 by Dr. Okey. Due to dysphagia and need for nutritional support General Surgery is consulted for gastrostomy tube placement.  Interventional radiology was consulted but, due to overlying transverse colon, the patient is not a candidate for IR gastrostomy tube.  Discussed with Dr. Ebbie, and with the patient, plan to proceed with open gastrostomy tube placement today 10/6.    I reviewed nursing notes, Consultant ENT notes, hospitalist notes, last 24 h vitals and pain scores, last 48 h intake and output, last 24 h labs and trends, and last 24 h imaging results.  Almarie GORMAN Pringle, PA-C [facility] 03/30/2024, 11:23 AM Please see Amion for pager number during day hours 7:00am-4:30pm or 7:00am -11:30am on weekends

## 2024-03-30 NOTE — Transfer of Care (Signed)
 Immediate Anesthesia Transfer of Care Note  Patient: Stephen Bowen  Procedure(s) Performed: CREATION, GASTROSTOMY, OPEN (Abdomen)  Patient Location: PACU  Anesthesia Type:General  Level of Consciousness: drowsy  Airway & Oxygen Therapy: Patient Spontanous Breathing and Patient connected to tracheostomy mask oxygen  Post-op Assessment: Report given to RN and Post -op Vital signs reviewed and stable  Post vital signs: Reviewed and stable  Last Vitals:  Vitals Value Taken Time  BP 157/95 03/30/24 13:31  Temp    Pulse 115 03/30/24 13:35  Resp 10 03/30/24 13:35  SpO2 98 % 03/30/24 13:35  Vitals shown include unfiled device data.  Last Pain:  Vitals:   03/30/24 1151  TempSrc: Oral  PainSc:          Complications: No notable events documented.

## 2024-03-30 NOTE — Progress Notes (Signed)
 As discussed with Dr. Soldatova, she discussed with radiation oncology Dr. Izell, they request patient to be transferred to [facility] to start radiation treatment, bed requested for progressive care at [facility]. Brayton Lye MD

## 2024-03-30 NOTE — Anesthesia Postprocedure Evaluation (Signed)
 Anesthesia Post Note  Patient: Stephen Bowen  Procedure(s) Performed: CREATION, GASTROSTOMY, OPEN (Abdomen)     Patient location during evaluation: PACU Anesthesia Type: General Level of consciousness: awake and alert Pain management: pain level controlled Vital Signs Assessment: post-procedure vital signs reviewed and stable Respiratory status: spontaneous breathing, nonlabored ventilation, respiratory function stable and patient connected to nasal cannula oxygen Cardiovascular status: blood pressure returned to baseline and stable Postop Assessment: no apparent nausea or vomiting Anesthetic complications: no   No notable events documented.  Last Vitals:  Vitals:   03/30/24 1345 03/30/24 1400  BP: 135/88 (!) 142/88  Pulse: (!) 116 (!) 116  Resp: 12 10  Temp:    SpO2: 96% 95%    Last Pain:  Vitals:   03/30/24 1400  TempSrc:   PainSc: Asleep   Pain Goal:                   Rome Ade

## 2024-03-30 NOTE — Plan of Care (Signed)
  Problem: Health Behavior/Discharge Planning: Goal: Ability to manage health-related needs will improve Outcome: Progressing   Problem: Clinical Measurements: Goal: Ability to maintain clinical measurements within normal limits will improve Outcome: Progressing Goal: Will remain free from infection Outcome: Progressing Goal: Respiratory complications will improve Outcome: Progressing   Problem: Pain Managment: Goal: General experience of comfort will improve and/or be controlled Outcome: Progressing   Problem: Safety: Goal: Ability to remain free from injury will improve Outcome: Progressing   Problem: Skin Integrity: Goal: Risk for impaired skin integrity will decrease Outcome: Progressing

## 2024-03-30 NOTE — Progress Notes (Addendum)
 Speech Language Pathology  Patient Details Name: Stephen Bowen MRN: [id number] DOB: 1955-07-24 Today's Date: 03/30/2024 Time: 8956-8883 SLP Time Calculation (min) (ACUTE ONLY): 33 min   Unable to perform BSE as pt is NPO to receive PEG today. Discussed with MD who stated pt will need PEG regardless of swallow function for nutrition as he progresses in treatment of hypopharyngeal mass (possible chemo/radiation). Can perform BSE when able and pt will need MBS to fully assess oropharyngeal phase swallow.    Dustin Olam Bull  03/30/2024, 11:44 AM

## 2024-03-30 NOTE — TOC Initial Note (Signed)
 Transition of Care [facility]) - Initial/Assessment Note    Patient Details  Name: Stephen Bowen MRN: [id number] Date of Birth: 07-10-1955  Transition of Care [facility]) CM/SW Contact:    Inocente GORMAN Kindle, LCSW Phone Number: 03/30/2024, 8:43 AM  Clinical Narrative:                 Patient admitted from [facility] under long term care. Patient with new tracheostomy placed 03/27/24. CSW provided update to SNF. Will follow for their ability to handle a trach.   Expected Discharge Plan: Skilled Nursing Facility Barriers to Discharge: Continued Medical Work up   Patient Goals and CMS Choice            Expected Discharge Plan and Services In-house Referral: Clinical Social Work     Living arrangements for the past 2 months: Skilled Nursing Facility                                      Prior Living Arrangements/Services Living arrangements for the past 2 months: Skilled Nursing Facility Lives with:: Facility Resident Patient language and need for interpreter reviewed:: Yes Do you feel safe going back to the place where you live?: Yes      Need for Family Participation in Patient Care: Yes (Comment) Care giver support system in place?: Yes (comment)   Criminal Activity/Legal Involvement Pertinent to Current Situation/Hospitalization: No - Comment as needed  Activities of Daily Living   ADL Screening (condition at time of admission) Independently performs ADLs?: No Does the patient have a NEW difficulty with bathing/dressing/toileting/self-feeding that is expected to last >3 days?: No Does the patient have a NEW difficulty with getting in/out of bed, walking, or climbing stairs that is expected to last >3 days?: No Does the patient have a NEW difficulty with communication that is expected to last >3 days?: Yes (Initiates electronic notice to provider for possible SLP consult) Is the patient deaf or have difficulty hearing?: No Does the patient have difficulty seeing,  even when wearing glasses/contacts?: No Does the patient have difficulty concentrating, remembering, or making decisions?: Yes  Permission Sought/Granted Permission sought to share information with : Facility Medical sales representative, Family Supports Permission granted to share information with : Yes, Verbal Permission Granted  Share Information with NAME: Shaun[place], Kre[place] [phone]  Permission granted to share info w AGENCY: [facility]        Emotional Assessment Appearance:: Appears stated age Attitude/Demeanor/Rapport: Unable to Assess Affect (typically observed): Unable to Assess Orientation: :  (new trach) Alcohol / Substance Use: Not Applicable Psych Involvement: No (comment)  Admission diagnosis:  Pharyngeal mass [J39.2] Hypopharyngeal mass [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.2] Tracheostomy tube present [facility]) [Z93.0] Patient Active Problem List   Diagnosis Date Noted   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   Hypopharyngeal mass 03/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   Tracheostomy tube present (HCC) 03/27/2024   Wound infection 01/10/2018   Hemorrhage from wound 01/09/2018   Acute renal failure (ARF) 01/09/2018   Malnutrition of moderate degree 11/11/2017   Sepsis (HCC) 11/09/2017   Acute metabolic encephalopathy 11/09/2017   Anemia 11/09/2017   Tachycardia 11/09/2017   Non-healing wound of amputation stump (HCC) 10/08/2017   MRSA carrier 08/02/2016   Aortic atherosclerosis 08/01/2016   Coronary atherosclerosis of native coronary artery 08/01/2016   Underweight 08/01/2016   AKI (acute kidney injury) 07/30/2016   HLD (hyperlipidemia) 07/30/2016   GERD (gastroesophageal reflux disease) 07/30/2016   Acute  encephalopathy 07/30/2016   Abdominal pain 07/30/2016   Pressure injury of skin 06/28/2016   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    Cortical blindness of right side of brain    Aphasia    Hypotension 06/21/2016   Stroke (HCC) 06/21/2016   Acute MI, anterolateral wall, initial episode of care  [facility])    Preoperative cardiovascular examination    PAD (peripheral artery disease) 02/13/2016   Hyperkalemia 02/13/2016   Lactic acid increased 02/13/2016   Essential hypertension 02/13/2016   Seborrheic dermatitis 08/31/2015   Actinic keratosis of multiple sites of head and neck 08/31/2015   Tobacco use disorder 08/31/2015   Skin ulcer of scalp (HCC) 08/31/2015   Abnormal EKG 11/13/2013   Hypertensive urgency 11/13/2013   PCP:  Trinidad Glisson, MD Pharmacy:   [facility]. [place], [facility] - [address] [place] [address] Phone: [phone] Fax: [phone]     Social Drivers of Health (SDOH) Social History: SDOH Screenings   Food Insecurity: Patient Unable To Answer (03/27/2024)  Housing: Low Risk  (03/27/2024)  Transportation Needs: No Transportation Needs (03/27/2024)  Utilities: Not At Risk (03/27/2024)  Depression (PHQ2-9): Low Risk  (03/02/2024)  Social Connections: Patient Unable To Answer (03/27/2024)  Tobacco Use: High Risk (03/27/2024)   SDOH Interventions:     Readmission Risk Interventions     No data to display

## 2024-03-30 NOTE — Progress Notes (Signed)
 Upon RT arrival found on 4L 28%. Per oxygen 28% on ATC bottle flow meter should be set at 6L.

## 2024-03-30 NOTE — Plan of Care (Signed)
 IR was requested for image guided G tube placement.   CT abdomen obtained, case was reviewed by Dr. Vanice, the stomach is obscured by the transverse colon. Recommended general surgery consult.    Ordering provider notified.   Will delete the IR G tube placement order.  Please call IR for questions and concerns.   Alecxis Baltzell H Brandonn Capelli PA-C 03/30/2024 9:05 AM

## 2024-03-30 NOTE — Progress Notes (Signed)
 PT Cancellation Note  Patient Details Name: Stephen Bowen MRN: [id number] DOB: September 02, 1955   Cancelled Treatment:    Reason Eval/Treat Not Completed: Patient at procedure or test/unavailable (Pt getting PEG tube. Will return at later date.)   [name] [name] 03/30/2024, 12:23 PM [name] M,PT Acute Rehab Services [phone]

## 2024-03-30 NOTE — Progress Notes (Signed)
 O2 sat 83%.  O2 at 6liters and 28%.  Deep suctioned trach with immediate improvement.,  Pt HR increased to over 100 while suctioning.    O2 sat increased to 100% on 6 liters and 28 percent.

## 2024-03-30 NOTE — Anesthesia Preprocedure Evaluation (Signed)
 Anesthesia Evaluation  Patient identified by MRN, date of birth, ID band Patient awake    Reviewed: Allergy & Precautions, NPO status , Patient's Chart, lab work & pertinent test results  History of Anesthesia Complications Negative for: history of anesthetic complications  Airway Mallampati: Trach  TM Distance: >3 FB Neck ROM: Full    Dental  (+) Teeth Intact   Pulmonary neg sleep apnea, neg COPD, Current Smoker and Patient abstained from smoking. Fresh Tracheostomy (59 days old) in place 2/2 pharyngeal mass   Pulmonary exam normal breath sounds clear to auscultation       Cardiovascular Exercise Tolerance: Poor METShypertension, Pt. on medications + CAD, + Past MI and + Peripheral Vascular Disease  (-) dysrhythmias  Rhythm:Regular Rate:Normal - Systolic murmurs    Neuro/Psych  PSYCHIATRIC DISORDERS     Dementia CVA    GI/Hepatic ,GERD  ,,(+)     (-) substance abuse    Endo/Other  diabetes    Renal/GU CRFRenal disease     Musculoskeletal   Abdominal   Peds  Hematology   Anesthesia Other Findings Past Medical History: No date: CKD (chronic kidney disease), stage II No date: Coronary artery disease No date: Dementia (HCC) No date: Diabetes mellitus without complication (HCC) 07/2016: Encephalopathy acute No date: HTN (hypertension) No date: Hyperlipemia No date: Myocardial infarction (HCC) No date: Non-healing surgical wound     Comment:  left AKA No date: Noncompliance No date: PAD (peripheral artery disease)     Comment:  a.  s/p left femoral to PT artery bypass with propaten               on 02/16/16. No date: S/P AKA (above knee amputation) bilateral (HCC) No date: Tobacco abuse  Reproductive/Obstetrics                              Anesthesia Physical Anesthesia Plan  ASA: 4  Anesthesia Plan: General   Post-op Pain Management: Ofirmev  IV (intra-op)*   Induction:  Intravenous  PONV Risk Score and Plan: 1 and Ondansetron , Dexamethasone  and Treatment may vary due to age or medical condition  Airway Management Planned: Tracheostomy  Additional Equipment: None  Intra-op Plan:   Post-operative Plan: Extubation in OR  Informed Consent: I have reviewed the patients History and Physical, chart, labs and discussed the procedure including the risks, benefits and alternatives for the proposed anesthesia with the patient or authorized representative who has indicated his/her understanding and acceptance.     Dental advisory given and Consent reviewed with POA  Plan Discussed with: CRNA and Surgeon  Anesthesia Plan Comments: (Discussed risks of anesthesia with patient, including PONV, lip/dental/eye damage. Rare risks discussed as well, such as cardiorespiratory and neurological sequelae, and allergic reactions. Discussed the role of CRNA in patient's perioperative care. Given that patient is unable to speak to participate in the informed consent process, I also called his sister Wendy Swingle, and relayed all of the above to her. Discussed patient's overall higher risk due to his malnutrition. She understood, and had no questions.)         Anesthesia Quick Evaluation

## 2024-03-30 NOTE — Progress Notes (Signed)
 PROGRESS NOTE    Stephen Bowen  FMW:[id number] DOB: Feb 20, 1956 DOA: 03/27/2024 PCP: Trinidad Glisson, MD    No chief complaint on file.   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 - Followed by SLP  - Tracheostomy management by ENT, continue with cuffed trach, deflated, do not exchange in case have bleed and will need cuff inflated for airway protection . - IR consulted for PEG placement, will need surgical PEG has transverse colon laying over the stomach, general surgery input greatly appreciated, going for surgical PEG today     Hemoptysis - Not having significant hemoptysis any longer   Protein calorie malnutrition - Tube feed once tube inserted  Failure to thrive  In calorie malnutrition Bilateral AKA Deconditioning  PT OT consult in place Speech to follow BMI 15  History of PAD History of CVA History of CAD -Resume aspirin , fenofibrate, and statin when stable   Hyperlipidemia - Resume statin when cleared for oral intake  Diabetes mellitus - Hold metformin, keep on insulin  sliding scale for now  Hypertension - Hold antihypertensive medication given soft blood sugar, keep on as needed hydralazine   DVT prophylaxis: (Heparin ) Code Status: (Full) Family Communication: (none at ebdside) Disposition:   Status is: Inpatient    Consultants:  PCCM ENT    Subjective:   patient was  seen and examined earlier today before he went for surgical PEG  No significant events overnight as discussed with staff, he denies any complaints today  Objective: Vitals:   03/30/24 1339 03/30/24 1345 03/30/24 1400 03/30/24 1410  BP:  135/88 (!) 142/88 (!) 133/90  Pulse: (!) 122 (!) 116 (!) 116 (!) 116  Resp: 12 12 10  (!) 9  Temp:    97.9 F (36.6 C)  TempSrc:      SpO2: 99% 96% 95% 93%  Weight:      Height:        Intake/Output Summary (Last 24 hours) at 03/30/2024 1416 Last data filed at 03/30/2024 1334 Gross per 24 hour  Intake 846.85 ml  Output 1255 ml  Net -408.15 ml   Filed Weights   03/27/24 1016 03/27/24 1708 03/30/24 1151  Weight: 51.1 kg 42.7 kg 42.7 kg    Examination:  Awake Alert, Oriented X 3, pale, chronically ill-appearing Tracheostomy present, Symmetrical Chest wall movement, Good air movement bilaterally,  RRR,No Gallops,Rubs or new Murmurs, No Parasternal Heave +ve B.Sounds, Abd Soft, No tenderness Bilateral AKA   Data Reviewed: I have personally reviewed following labs and imaging studies  CBC: Recent Labs  Lab 03/27/24 1756 03/27/24 1758 03/29/24 0309 03/30/24 0342  WBC 7.9  --  9.1 11.0*  HGB 10.8*  --  10.8* 12.2*  HCT 33.2*  --  33.4* 37.6*  MCV 94.6  --  92.3 93.3  PLT 408* 433* 409* 435*    Basic Metabolic Panel: Recent Labs  Lab 03/29/24 0309 03/30/24 0342  NA 131* 137  K 3.1*  4.2  CL 100 104  CO2 23 23  GLUCOSE 360* 82  BUN 5* 8  CREATININE 0.73 0.74  CALCIUM  8.1* 8.6*  MG 1.4* 2.0  PHOS 3.1 3.4    GFR: Estimated Creatinine Clearance: 53.4 mL/min (by C-G formula based on SCr of 0.74 mg/dL).  Liver Function Tests: No results for input(s): AST, ALT, ALKPHOS, BILITOT, PROT, ALBUMIN  in the last 168 hours.  CBG: Recent Labs  Lab 03/30/24 0100 03/30/24 0325 03/30/24 0810 03/30/24 1123 03/30/24 1339  GLUCAP 84 87 79 78 78     No results found for this or any previous visit (from the past 240  hours).       Radiology Studies: CT ANGIO NECK W OR WO CONTRAST Result Date: 03/29/2024 EXAM: CTA Neck 03/29/2024 05:39:44 AM TECHNIQUE: CT of the neck was performed without and with the administration of 75 mL of iohexol (OMNIPAQUE) 350 MG/ML injection. Multiplanar 2D and/or 3D reformatted images are provided for review. Automated exposure control, iterative reconstruction, and/or weight based adjustment of the mA/kV was utilized to reduce the radiation dose to as low as reasonably achievable. Stenosis of the internal carotid arteries measured using NASCET criteria. COMPARISON: CT of the neck dated 02/27/2024 and PET/CT fusion study dated 03/09/2024. CLINICAL HISTORY: Pharyngeal cancer, oral bleeding need to evaluate for major vessel invasion by the tumor extravasation. FINDINGS: AORTIC ARCH AND ARCH VESSELS: No dissection or arterial injury. No significant stenosis of the brachiocephalic or subclavian arteries. CERVICAL CAROTID ARTERIES: No dissection, arterial injury, or hemodynamically significant stenosis by NASCET criteria. No encasement of the carotid arteries or proximal branches of the external carotid arteries. CERVICAL VERTEBRAL ARTERIES: No dissection, arterial injury, or significant stenosis. LUNGS AND MEDIASTINUM: Since the previous studies, a tracheostomy tube has been placed and its tip is in an appropriate position within the trachea. SOFT TISSUES: There is nodular thickening and enhancement again demonstrated within the posterior pharyngeal wall of the oral pharynx and hypopharynx bilaterally, more extensive on the left. There is no evidence of active bleeding. BONES: No acute abnormality. IMPRESSION: 1. Nodular thickening and enhancement within the posterior pharyngeal wall of the oral and hypopharynx bilaterally, more extensive on the left, without evidence of active bleeding or major vessel invasion 2. Tracheostomy tube in appropriate position within the trachea Electronically signed by:  Evalene Coho MD 03/29/2024 06:38 AM EDT RP Workstation: [id number]   CT ABDOMEN WO CONTRAST Result Date: 03/29/2024 CLINICAL DATA:  Assess G-tube placement. Head and neck cancer. * Tracking Code: BO * EXAM: CT ABDOMEN WITHOUT CONTRAST TECHNIQUE: Multidetector CT imaging of the abdomen was performed following the standard protocol without IV contrast. RADIATION DOSE REDUCTION: This exam was performed according to the departmental dose-optimization program which includes automated exposure control, adjustment of the mA and/or kV according to patient size and/or use of iterative reconstruction technique. COMPARISON:  PET-CT 03/09/2024 FINDINGS: Image quality degraded by beam hardening artifact emanating from retained barium contrast material in the colon . Lower chest: There is debris in the bronchus intermedius and left lower lobe airway. Central collapse/consolidative opacity is seen in the left upper lobe (image 9/series 6 with peripheral airway impaction seen in the posterior lower lobes bilaterally. Dependent atelectasis noted in both lung bases with trace bilateral pleural effusions. Hepatobiliary: No suspicious focal abnormality in the liver on this study without intravenous contrast. Gallbladder is distended. Tiny calcified gallstones evident. Porta hepatis, pancreatic head, and descending duodenum obscured by beam hardening artifact. Pancreas: Pancreas essentially obscured by beam hardening artifact from retained  barium contrast material in the colon. Spleen: No splenomegaly. No suspicious focal mass lesion. Adrenals/Urinary Tract: No adrenal nodule or mass. Right kidney unremarkable. Tiny nonobstructing stone identified lower pole left kidney. Stomach/Bowel: Stomach is nondistended in largely obscured by artifact. As above, descending duodenum is obscured. No small bowel dilatation within the visualized abdomen. Diffuse colonic distension noted with retained contrast material. Vascular/Lymphatic: There  is moderate atherosclerotic calcification of the abdominal aorta without aneurysm. No retroperitoneal lymphadenopathy. Gastrohepatic and hepatoduodenal ligaments are largely obscured by beam hardening artifact. Other: No intraperitoneal free fluid. Musculoskeletal: No worrisome lytic or sclerotic osseous abnormality. Old posterior left rib fractures evident. Compression deformity at T11 and T12 is similar to chest CT of 02/20/2024. IMPRESSION: 1. Image quality degraded by beam hardening artifact emanating from retained barium contrast material in the colon. 2. Debris in the bronchus intermedius and left lower lobe airway with central collapse/consolidative opacity in the left upper lobe. Imaging features concerning for aspiration with associated infectious/inflammatory consolidative opacity in the central left lower lobe. 3. Stomach is nondistended and largely obscured by artifact. 4. Diffuse colonic distension with retained contrast material. 5. Cholelithiasis. 6. Tiny nonobstructing stone lower pole left kidney. 7.  Aortic Atherosclerosis (ICD10-I70.0). Electronically Signed   By: Camellia Candle M.D.   On: 03/29/2024 06:02        Scheduled Meds:  [MAR Hold] Chlorhexidine  Gluconate Cloth  6 each Topical Q0600   [MAR Hold] docusate  100 mg Per Tube BID   [MAR Hold] famotidine  20 mg Per Tube BID   [MAR Hold] heparin  injection (subcutaneous)  5,000 Units Subcutaneous Q8H   [MAR Hold] insulin  aspart  0-9 Units Subcutaneous Q4H   [MAR Hold] levETIRAcetam  500 mg Intravenous Q12H   methocarbamol  (ROBAXIN ) injection  500 mg Intravenous Q8H   [MAR Hold] mouth rinse  15 mL Mouth Rinse 4 times per day   [MAR Hold] polyethylene glycol  17 g Per Tube Daily   [MAR Hold] scopolamine  1 patch Transdermal Q72H   Continuous Infusions:  acetaminophen        LOS: 3 days      Porsha Skilton, MD [facility]   To contact the attending provider between 7A-7P or the covering provider during after  hours 7P-7A, please log into the web site [email or web address] and access using universal [place] password for that web site. If you do not have the password, please call the hospital operator.  03/30/2024, 2:16 PM

## 2024-03-31 ENCOUNTER — Encounter (HOSPITAL_COMMUNITY): Payer: Self-pay | Admitting: General Surgery

## 2024-03-31 DIAGNOSIS — J392 Other diseases of pharynx: Secondary | ICD-10-CM | POA: Diagnosis not present

## 2024-03-31 LAB — GLUCOSE, CAPILLARY
Glucose-Capillary: 114 mg/dL — ABNORMAL HIGH (ref 70–99)
Glucose-Capillary: 138 mg/dL — ABNORMAL HIGH (ref 70–99)
Glucose-Capillary: 84 mg/dL (ref 70–99)
Glucose-Capillary: 89 mg/dL (ref 70–99)
Glucose-Capillary: 96 mg/dL (ref 70–99)
Glucose-Capillary: 99 mg/dL (ref 70–99)
Glucose-Capillary: 99 mg/dL (ref 70–99)

## 2024-03-31 LAB — CBC
HCT: 35 % — ABNORMAL LOW (ref 39.0–52.0)
Hemoglobin: 11.4 g/dL — ABNORMAL LOW (ref 13.0–17.0)
MCH: 30.1 pg (ref 26.0–34.0)
MCHC: 32.6 g/dL (ref 30.0–36.0)
MCV: 92.3 fL (ref 80.0–100.0)
Platelets: 490 K/uL — ABNORMAL HIGH (ref 150–400)
RBC: 3.79 MIL/uL — ABNORMAL LOW (ref 4.22–5.81)
RDW: 13.5 % (ref 11.5–15.5)
WBC: 9.4 K/uL (ref 4.0–10.5)
nRBC: 0 % (ref 0.0–0.2)

## 2024-03-31 LAB — BASIC METABOLIC PANEL WITH GFR
Anion gap: 13 (ref 5–15)
BUN: 11 mg/dL (ref 8–23)
CO2: 23 mmol/L (ref 22–32)
Calcium: 9 mg/dL (ref 8.9–10.3)
Chloride: 101 mmol/L (ref 98–111)
Creatinine, Ser: 0.84 mg/dL (ref 0.61–1.24)
GFR, Estimated: >60 mL/min (ref >60)
Glucose, Bld: 103 mg/dL — ABNORMAL HIGH (ref 70–99)
Potassium: 4.6 mmol/L (ref 3.5–5.1)
Sodium: 137 mmol/L (ref 135–145)

## 2024-03-31 LAB — MAGNESIUM: Magnesium: 1.9 mg/dL (ref 1.7–2.4)

## 2024-03-31 LAB — PHOSPHORUS: Phosphorus: 4.3 mg/dL (ref 2.5–4.6)

## 2024-03-31 LAB — SURGICAL PATHOLOGY

## 2024-03-31 MED ORDER — ATORVASTATIN CALCIUM 40 MG PO TABS
40.0000 mg | ORAL_TABLET | Freq: Every day | ORAL | Status: DC
  Administered 2024-03-31 – 2024-04-17 (×15): 40 mg
  Filled 2024-03-31 (×17): qty 1

## 2024-03-31 MED ORDER — OSMOLITE 1.2 CAL PO LIQD
1000.0000 mL | ORAL | Status: DC
Start: 2024-03-31 — End: 2024-04-14
  Administered 2024-03-31 – 2024-04-14 (×14): 1000 mL
  Filled 2024-03-31 (×20): qty 1000

## 2024-03-31 MED ORDER — ASPIRIN 81 MG PO CHEW
81.0000 mg | CHEWABLE_TABLET | Freq: Every day | ORAL | Status: DC
  Administered 2024-04-01 – 2024-04-19 (×17): 81 mg
  Filled 2024-03-31 (×19): qty 1

## 2024-03-31 MED ORDER — METOPROLOL TARTRATE 25 MG PO TABS
12.5000 mg | ORAL_TABLET | Freq: Two times a day (BID) | ORAL | Status: DC
Start: 2024-03-31 — End: 2024-04-04
  Administered 2024-03-31 – 2024-04-03 (×7): 12.5 mg
  Filled 2024-03-31 (×8): qty 1

## 2024-03-31 MED ORDER — THIAMINE MONONITRATE 100 MG PO TABS
100.0000 mg | ORAL_TABLET | Freq: Every day | ORAL | Status: AC
Start: 2024-03-31 — End: 2024-04-05
  Administered 2024-03-31 – 2024-04-03 (×4): 100 mg
  Filled 2024-03-31 (×4): qty 1

## 2024-03-31 MED ORDER — ADULT MULTIVITAMIN W/MINERALS CH
1.0000 | ORAL_TABLET | Freq: Every day | ORAL | Status: DC
  Administered 2024-03-31 – 2024-04-03 (×4): 1
  Filled 2024-03-31 (×4): qty 1

## 2024-03-31 MED ORDER — FREE WATER
100.0000 mL | Freq: Four times a day (QID) | Status: DC
  Administered 2024-03-31 – 2024-04-20 (×74): 100 mL

## 2024-03-31 NOTE — Progress Notes (Signed)
 PROGRESS NOTE    Stephen Bowen  FMW:[id number] DOB: [birth year] DOA: 03/27/2024 PCP: Trinidad Glisson, MD    No chief complaint on file.   Brief Narrative:   68 yo M who presented 10/3 for biopsy of pharyngeal mass and tracheostomy placement by ENT, patient remained intubated after tracheostomy, admitted to ICU, required vent over first 24 hours, he is currently off the vent, so transferred to progressive care 03/30/2024 Frozen section during case is positive for SCC, IR could not do PEG due to transverse colon overlying stomach, so patient went for surgical PEG 03/30/2024, per ENT request, patient need to be transferred to [facility] to initiate radiation treatment.   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 -Tracheostomy management by ENT, continue with cuffed trach, deflated, do not exchange in case have bleed and will need cuff inflated for airway protection . - Can be placed by IR as transverse colon laying over the stomach, general surgery input greatly appreciated, went for surgical PEG 10/6. - As discussed with ENT, they request transfer to [facility] to be seen by radiation oncology Dr. Izell, to initiate radiation therapy, awaiting bed availability - Followed by SLP, awaiting BMV, plan for MBS when he gets to [facility],    Hemoptysis - Not having significant hemoptysis any longer   Protein calorie malnutrition - Started on tube feed via PEG, monitor for refeeding syndrome  Failure to thrive  In calorie malnutrition Bilateral AKA Deconditioning  PT OT consult in place Speech to follow BMI 15 Started on tube feed  History of PAD History of CVA History of CAD -Resume aspirin , fenofibrate, and statin     Hyperlipidemia - Resume statin   Diabetes mellitus - Hold metformin, keep on insulin  sliding scale for now  Hypertension - Hold antihypertensive medication given soft blood blood pressures, he is on as needed. hydralazine  - Blood pressure mildly improved today, so we will start on metoprolol , but lower dose.  DVT prophylaxis: (Heparin ) Code Status: (Full) Family Communication: (none at ebdside) updated sister and niece by phone Disposition:   Status is: Inpatient    Consultants:  PCCM ENT General Surgery  Procedures:  - 03/27/2024 by Dr. Okey Direct Laryngoscopy and Biopsy  Tracheostomy    - 03/30/2024 by Dr. Ebbie Open Astrid gastrostomy tube   Subjective:  No significant events overnight, started on tube feeds today, awaiting bed availability at [facility] to be evaluated by radiation oncology  Objective: Vitals:   03/31/24 0000 03/31/24 0749 03/31/24 0808 03/31/24 1159  BP:   125/73 120/80  Pulse:  98 82 83  Resp:  15 14 13   Temp: 97.7 F (36.5 C)  97.9 F (36.6 C) 98.5 F (36.9 C)  TempSrc: Oral  Oral Oral  SpO2:  100% 100% 100%  Weight:      Height:        Intake/Output Summary (Last 24 hours) at 03/31/2024 1412 Last data filed at 03/31/2024 0612 Gross per 24 hour  Intake 335 ml  Output 500 ml  Net -165 ml   Filed Weights   03/27/24 1016 03/27/24 1708 03/30/24 1151  Weight: 51.1 kg 42.7 kg 42.7 kg    Examination:  Awake Alert, frail, chronically appearing  Tracheostomy, copious secretions  Good air entry  bilaterally  Regular rate and rhythm  Abdomen soft, incision C/D/I, G-tube in upper quadrant, Bilateral AKA    Data Reviewed: I have personally reviewed following labs and imaging studies  CBC: Recent Labs  Lab 03/27/24 1756 03/27/24 1758 03/29/24 0309 03/30/24 0342 03/31/24 0354  WBC 7.9  --  9.1 11.0* 9.4  HGB 10.8*  --  10.8* 12.2* 11.4*  HCT 33.2*  --  33.4* 37.6* 35.0*  MCV 94.6  --  92.3 93.3 92.3   PLT 408* 433* 409* 435* 490*    Basic Metabolic Panel: Recent Labs  Lab 03/29/24 0309 03/30/24 0342 03/31/24 0354  NA 131* 137 137  K 3.1* 4.2 4.6  CL 100 104 101  CO2 23 23 23   GLUCOSE 360* 82 103*  BUN 5* 8 11  CREATININE 0.73 0.74 0.84  CALCIUM  8.1* 8.6* 9.0  MG 1.4* 2.0 1.9  PHOS 3.1 3.4 4.3    GFR: Estimated Creatinine Clearance: 50.8 mL/min (by C-G formula based on SCr of 0.84 mg/dL).  Liver Function Tests: No results for input(s): AST, ALT, ALKPHOS, BILITOT, PROT, ALBUMIN  in the last 168 hours.  CBG: Recent Labs  Lab 03/30/24 1952 03/31/24 0019 03/31/24 0418 03/31/24 0805 03/31/24 1159  GLUCAP 100* 96 99 84 89     No results found for this or any previous visit (from the past 240 hours).       Radiology Studies: No results found.       Scheduled Meds:  Chlorhexidine  Gluconate Cloth  6 each Topical Q0600   docusate  100 mg Per Tube BID   famotidine  20 mg Per Tube BID   free water  100 mL Per Tube Q6H   heparin  injection (subcutaneous)  5,000 Units Subcutaneous Q8H   insulin  aspart  0-9 Units Subcutaneous Q4H   levETIRAcetam  500 mg Intravenous Q12H   methocarbamol  (ROBAXIN ) injection  500 mg Intravenous Q8H   multivitamin with minerals  1 tablet Per Tube Daily   mouth rinse  15 mL Mouth Rinse 4 times per day   polyethylene glycol  17 g Per Tube Daily   scopolamine  1 patch Transdermal Q72H   thiamine   100 mg Per Tube Daily   Continuous Infusions:  feeding supplement (OSMOLITE 1.2 CAL)     lactated ringers  75 mL/hr at 03/31/24 0742     LOS: 4 days      Brayton Lye, MD [facility]   To contact the attending provider between 7A-7P or the covering provider during after hours 7P-7A, please log into the web site [email or web address] and access using universal [place] password for that web site. If you do not have the password, please call the hospital operator.  03/31/2024, 2:12 PM

## 2024-03-31 NOTE — Progress Notes (Signed)
 Physical Therapy Treatment Patient Details Name: Stephen Bowen MRN: [id number] DOB: 1956/05/18 Today's Date: 03/31/2024   History of Present Illness Patient is a 68 y.o. male with PMH: HTN, dementia, encephalopathy, MI, HLD, PAD, CAD, daily tobacco use. He presented to the hospital on 03/27/2024 for biopsy of pharyngeal mass and tracheostomy placement. He had been having difficulty swallowing x10 days and mainly was drinking fluids for his nutrition. 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    PT Comments  Pt with fair tolerance to treatment today. Pt declining all mobility however was agreeable to bed level exercises. No change in DC/DME recs at this time. PT will continue to follow.     If plan is discharge home, recommend the following: Other (comment) (From SNF)   Can travel by private vehicle     No  Equipment Recommendations  Wheelchair (measurements PT);Wheelchair cushion (measurements PT);Other (comment)    Recommendations for Other Services       Precautions / Restrictions Precautions Precautions: Fall;Other (comment) Recall of Precautions/Restrictions: Impaired Restrictions Weight Bearing Restrictions Per Provider Order: No     Mobility  Bed Mobility               General bed mobility comments: Pt declined    Transfers                   General transfer comment: Pt declined    Ambulation/Gait                   Stairs             Wheelchair Mobility     Tilt Bed    Modified Rankin (Stroke Patients Only)       Balance                                            Communication Communication Communication: Impaired Factors Affecting Communication: Trach/intubated  Cognition Arousal: Alert Behavior During Therapy: WFL for tasks assessed/performed, Anxious   PT - Cognitive impairments: History of cognitive impairments, No family/caregiver present to determine baseline, Difficult to assess Difficult to assess due  to: Impaired communication                     PT - Cognition Comments: Made attempts to communicate and did well with yes/no questions; Following commands: Impaired Following commands impaired: Follows one step commands with increased time    Cueing Cueing Techniques: Verbal cues, Gestural cues, Tactile cues, Visual cues  Exercises General Exercises - Upper Extremity Elbow Flexion: AROM, Both, 10 reps, Supine General Exercises - Lower Extremity Hip Flexion/Marching: AROM, Both, 5 reps, Supine Other Exercises Other Exercises: Punching pillow x    General Comments        Pertinent Vitals/Pain Pain Assessment Pain Assessment: No/denies pain    Home Living                          Prior Function            PT Goals (current goals can now be found in the care plan section) Progress towards PT goals: Progressing toward goals    Frequency    Min 2X/week      PT Plan      Co-evaluation  AM-PAC PT 6 Clicks Mobility   Outcome Measure  Help needed turning from your back to your side while in a flat bed without using bedrails?: A Little Help needed moving from lying on your back to sitting on the side of a flat bed without using bedrails?: A Little Help needed moving to and from a bed to a chair (including a wheelchair)?: A Lot Help needed standing up from a chair using your arms (e.g., wheelchair or bedside chair)?: Total Help needed to walk in hospital room?: Total Help needed climbing 3-5 steps with a railing? : Total 6 Click Score: 11    End of Session Equipment Utilized During Treatment: Oxygen Activity Tolerance: Patient tolerated treatment well Patient left: in bed;with call bell/phone within reach;with nursing/sitter in room Nurse Communication: Mobility status PT Visit Diagnosis: Other abnormalities of gait and mobility (R26.89);Muscle weakness (generalized) (M62.81)     Time: 8572-8563 PT Time Calculation  (min) (ACUTE ONLY): 9 min  Charges:    $Therapeutic Exercise: 8-22 mins PT General Charges $$ ACUTE PT VISIT: 1 Visit                     Eldean Nanna B, PT, DPT Acute Rehab Services [phone]    Tahara Ruffini 03/31/2024, 3:15 PM

## 2024-03-31 NOTE — Progress Notes (Addendum)
 Nutrition Brief Note  PEG placed by surgery yesterday. Surgery cleared to start using today. Will enter orders and initiate feeds with slow titration upward. Alerted RN to new orders and entered labs draws and thiamine  r/t risk of refeeding.  Initiate tube feeding via PEG: Osmolite 1.2 at 55 ml/h (1320 ml per day) Initiate at 25ml/hr and increase by 10ml/hr q8 hours until goal rate achieved FWF 100 ml q6h (400 ml per day) Monitor magnesium , potassium, and phosphorus daily for at least 3 days, MD to replete as needed Add Thiamine  100 mg daily for 5 days    Provides 1584 kcal, 73 gm protein, 1482 ml free water daily    Add MVI w/ minerals   Monitor SLP notes regarding MBSS and diet advancement and assess need to modify to supplemental and/or nocturnal   May need modification to scheduled bowel regimen - no BM x3 days   NUTRITION DIAGNOSIS:    Severe Malnutrition related to chronic illness as evidenced by severe fat depletion, severe muscle depletion, percent weight loss. - remains applicable   GOAL:  Patient will meet greater than or equal to 90% of their needs  Stephen Deaner MS, RD, LDN Registered Dietitian Clinical Nutrition RD Inpatient Contact Info in Amion

## 2024-03-31 NOTE — Evaluation (Signed)
 Clinical/Bedside Swallow Evaluation Patient Details  Name: Stephen Bowen MRN: [id number] Date of Birth: 1955-12-05  Today's Date: 03/31/2024 Time: SLP Start Time (ACUTE ONLY): 9161 SLP Stop Time (ACUTE ONLY): 0844 SLP Time Calculation (min) (ACUTE ONLY): 6 min  Past Medical History:  Past Medical History:  Diagnosis Date   CKD (chronic kidney disease), stage II    Coronary artery disease    Dementia (HCC)    Diabetes mellitus without complication (HCC)    Encephalopathy acute 07/2016   HTN (hypertension)    Hyperlipemia    Myocardial infarction (HCC)    Non-healing surgical wound    left AKA   Noncompliance    PAD (peripheral artery disease)    a.  s/p left femoral to PT artery bypass with propaten on 02/16/16.   S/P AKA (above knee amputation) bilateral (HCC)    Tobacco abuse    Past Surgical History:  Past Surgical History:  Procedure Laterality Date   ABDOMINAL AORTOGRAM W/LOWER EXTREMITY N/A 08/02/2016   Procedure: Abdominal Aortogram w/Lower Extremity;  Surgeon: Penne Lonni Colorado, MD;  Location: [facility] INVASIVE CV LAB;  Service: Cardiovascular;  Laterality: N/A;   ABOVE KNEE LEG AMPUTATION Right 12/04/2016   AMPUTATION Left 06/25/2016   Procedure: AMPUTATION ABOVE KNEE;  Surgeon: Gaile LELON New, MD;  Location: [facility] OR;  Service: Vascular;  Laterality: Left;   AMPUTATION Right 12/04/2016   Procedure: RIGHT AMPUTATION ABOVE KNEE;  Surgeon: Colorado Penne Lonni, MD;  Location: [facility] OR;  Service: Vascular;  Laterality: Right;   AMPUTATION Left 10/08/2017   Procedure: LEFT ABOVE KNEE AMPUTATION REVISION;  Surgeon: Colorado Penne Lonni, MD;  Location: [facility] OR;  Service: Vascular;  Laterality: Left;   APPLICATION OF WOUND VAC Left 02/09/2021   Procedure: APPLICATION OF WOUND VAC;  Surgeon: [place] Penne Lonni, MD;  Location: [facility] OR;  Service: Vascular;  Laterality: Left;   BYPASS GRAFT POPLITEAL TO TIBIAL Left 06/21/2016   Procedure: BYPASS GRAFT POPLITEAL TO TIBIAL  USING GORE PROPATEN GRAFT;  Surgeon: Penne Lonni Colorado, MD;  Location: [facility] OR;  Service: Vascular;  Laterality: Left;   CREATION, GASTROSTOMY, OPEN N/A 03/30/2024   Procedure: CREATION, GASTROSTOMY, OPEN;  Surgeon: Ebbie Cough, MD;  Location: [facility] OR;  Service: General;  Laterality: N/A;   EMBOLECTOMY Left 06/21/2016   Procedure: EMBOLECTOMY left lower extremity.;  Surgeon: Penne Lonni Colorado, MD;  Location: [facility] OR;  Service: Vascular;  Laterality: Left;   ENDARTERECTOMY FEMORAL Left 02/16/2016   Procedure: LEFT FEMORAL ENDARTERECTOMY;  Surgeon: Penne Lonni Colorado, MD;  Location: [facility] OR;  Service: Vascular;  Laterality: Left;   FEMORAL BYPASS  02/16/2016   LEFT FEMORAL-POSTERIOR TIBIAL ARTERY BYPASS  WITH PROPATEN 6 MM X 80 CM VASCULAR RING GRAFT (Left)   FEMORAL-TIBIAL BYPASS GRAFT Left 02/16/2016   Procedure: LEFT FEMORAL-POSTERIOR TIBIAL ARTERY BYPASS  WITH PROPATEN 6 MM X 80 CM VASCULAR RING GRAFT;  Surgeon: Penne Lonni Colorado, MD;  Location: [facility] OR;  Service: Vascular;  Laterality: Left;   FRACTURE SURGERY     I & D EXTREMITY Left 09/03/2016   Procedure: IRRIGATION AND DEBRIDEMENT EXTREMITY - LEFT AKA;  Surgeon: Penne Lonni Colorado, MD;  Location: [facility] OR;  Service: Vascular;  Laterality: Left;   I & D EXTREMITY Left 01/09/2018   Procedure: IRRIGATION AND DEBRIDEMENT GROIN;  Surgeon: [place] Penne Lonni, MD;  Location: [facility] OR;  Service: Vascular;  Laterality: Left;   INCISION AND DRAINAGE Left 02/09/2021   Procedure: INCISION AND DRAINAGE OF LEFT GROIN WOUND;  Surgeon: Colorado,  Penne Bruckner, MD;  Location: [facility] OR;  Service: Vascular;  Laterality: Left;   MICROLARYNGOSCOPY WITH LASER N/A 03/27/2024   Procedure: DIRECT MICROLARYNGOSCOPY  AND BIOPSY;  Surgeon: Okey Burns, MD;  Location: [facility] OR;  Service: ENT;  Laterality: N/A;   PATCH ANGIOPLASTY Left 02/16/2016   Procedure: PATCH ANGIOPLASTY WITH GEORGE BIOLOGIC PATCH 1 CM X 6 CM;  Surgeon: Penne Bruckner Colorado, MD;  Location: [facility] OR;  Service: Vascular;  Laterality: Left;   PATCH ANGIOPLASTY Left 01/09/2018   Procedure: PATCH ANGIOPLASTY OF LEFT COMMON FEMORAL AND PROFUNDA  USING SAPHENOUS VEIN;  Surgeon: Colorado Penne Bruckner, MD;  Location: [facility] OR;  Service: Vascular;  Laterality: Left;   PERIPHERAL VASCULAR BALLOON ANGIOPLASTY Right 08/02/2016   Procedure: Peripheral Vascular Balloon Angioplasty;  Surgeon: Penne Bruckner Colorado, MD;  Location: [facility] INVASIVE CV LAB;  Service: Cardiovascular;  Laterality: Right;  peroneal artery   PERIPHERAL VASCULAR CATHETERIZATION N/A 07/07/2015   Procedure: Abdominal Aortogram w/Lower Extremity;  Surgeon: Redell LITTIE Door, MD;  Location: [facility] INVASIVE CV LAB;  Service: Cardiovascular;  Laterality: N/A;   PERIPHERAL VASCULAR CATHETERIZATION N/A 02/15/2016   Procedure: Abdominal Aortogram w/Lower Extremity;  Surgeon: Penne Bruckner Colorado, MD;  Location: [facility] INVASIVE CV LAB;  Service: Cardiovascular;  Laterality: N/A;   PERIPHERAL VASCULAR CATHETERIZATION Bilateral 06/20/2016   Procedure: Lower Extremity Angiography;  Surgeon: Penne Bruckner Colorado, MD;  Location: [facility] INVASIVE CV LAB;  Service: Cardiovascular;  Laterality: Bilateral;  limited runoff on rt leg thrombolysis lt leg bypass graft   PERIPHERAL VASCULAR INTERVENTION Right 08/02/2016   Procedure: Peripheral Vascular Intervention;  Surgeon: Penne Bruckner Colorado, MD;  Location: [facility] INVASIVE CV LAB;  Service: Cardiovascular;  Laterality: Right;  Femoral , popliteal   TIBIA FRACTURE SURGERY Left 2000   per patient, he has rod inside his left leg.   TRACHEOSTOMY TUBE PLACEMENT N/A 03/27/2024   Procedure: CREATION, TRACHEOSTOMY;  Surgeon: Okey Burns, MD;  Location: [facility] OR;  Service: ENT;  Laterality: N/A;   VEIN HARVEST Left 01/09/2018   Procedure: LEFT SAPHENOUS VEIN HARVEST;  Surgeon: Colorado Penne Bruckner, MD;  Location: [facility] OR;  Service: Vascular;  Laterality: Left;   HPI:  Patient is a  68 y.o. male with PMH: HTN, dementia, encephalopathy, MI, HLD, PAD, CAD, daily tobacco use. He presented to the hospital on 03/27/2024 for biopsy of pharyngeal mass and tracheostomy placement. He had been having difficulty swallowing x10 days and mainly was drinking fluids for his nutrition. 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 ENT following and recommended cuff deflation trial on 10/4. Received PEG 10/6. Barium esophagram 03/20/24 with posterior pharyngeal filling defect at site of the patient's  known pharyngeal mass. Contrast traverses the pharynx and passes  into the esophagus. However, there is some extension of contrast  into the nasopharynx while swallowing and this is likely due to  partial pharyngeal obstruction.  2. Somewhat prominent cricopharyngeus impression upon the  hypopharynx/cervical esophagus.    Assessment / Plan / Recommendation  Clinical Impression  Pt seen for brief swallow assessment with PMV donned. He coughing with increased secretions, noted however PMV assisted in mobilizing secretions to oral cavity for suctioning. Oromotor exam normal; has approximately 3 teeth total. Volitional cough was moderately strong. Pt given one bite applesauce with delayed coughing with oral suctioning containing small amount of applesauce possibly from pharyngeal residue. He continued to cough  as he coughed prior to po's when PMV donned and unable to fully assess at bedside. He declined thin liquids and PMV doffed. Pt  will need to have MBS given dx of hypophayrngeal mass and clinical findings. Per MD/chart plan is for pt to be transferred to [facility] to initiate intervention for tumor. Not appropriate for MBS today and recommend he have at [facility] when ready. SLP Visit Diagnosis: Dysphagia, unspecified (R13.10)    Aspiration Risk  Moderate aspiration risk    Diet Recommendation NPO (consider ice chips)    Medication Administration: Via alternative means    Other  Recommendations Oral Care Recommendations: Oral care  QID     Assistance Recommended at Discharge Intermittent Supervision/Assistance  Functional Status Assessment Patient has had a recent decline in their functional status and demonstrates the ability to make significant improvements in function in a reasonable and predictable amount of time.  Frequency and Duration min 2x/week  2 weeks       Prognosis Prognosis for improved oropharyngeal function: Fair      Swallow Study   General HPI: Patient is a 68 y.o. male with PMH: HTN, dementia, encephalopathy, MI, HLD, PAD, CAD, daily tobacco use. He presented to the hospital on 03/27/2024 for biopsy of pharyngeal mass and tracheostomy placement. He had been having difficulty swallowing x10 days and mainly was drinking fluids for his nutrition. 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 ENT following and recommended cuff deflation trial on 10/4. Received PEG 10/6. Barium esophagram 03/20/24 with posterior pharyngeal filling defect at site of the patient's  known pharyngeal mass. Contrast traverses the pharynx and passes  into the esophagus. However, there is some extension of contrast  into the nasopharynx while swallowing and this is likely due to  partial pharyngeal obstruction.  2. Somewhat prominent cricopharyngeus impression upon the  hypopharynx/cervical esophagus. Type of Study: Bedside Swallow Evaluation Previous Swallow Assessment:  (none) Diet Prior to this Study: NPO Temperature Spikes Noted: No Respiratory Status: Trach;Trach Collar Trach Size and Type: #6;Cuff;Deflated;With PMSV in place History of Recent Intubation: No Behavior/Cognition: Alert;Requires cueing Oral Cavity Assessment: Within Functional Limits Oral Care Completed by SLP: No Oral Cavity - Dentition:  (3 teeth total) Vision: Functional for self-feeding Self-Feeding Abilities: Able to feed self Patient Positioning: Upright in bed Baseline Vocal Quality: Hoarse Volitional Cough: Strong Volitional Swallow: Able to elicit     Oral/Motor/Sensory Function Overall Oral Motor/Sensory Function: Within functional limits   Ice Chips Ice chips: Not tested   Thin Liquid Thin Liquid:  (pt declined)    Nectar Thick Nectar Thick Liquid: Not tested   Honey Thick Honey Thick Liquid: Not tested   Puree Puree: Impaired Pharyngeal Phase Impairments: Cough - Delayed (and oral suction with mild applesauce noted)   Solid     Solid: Not tested      Dustin Olam Bull 03/31/2024,9:26 AM

## 2024-03-31 NOTE — Progress Notes (Signed)
 Speech Language Pathology Treatment: Stephen Bowen Speaking valve  Patient Details Name: Stephen Bowen MRN: [id number] DOB: Jul 25, 1955 Today's Date: 03/31/2024 Time: 9169-9161 SLP Time Calculation (min) (ACUTE ONLY): 8 min  Assessment / Plan / Recommendation Clinical Impression  Pt alert, noted to have more secretions this am per MD. Cuff deflated on arrival and PMV donned with pt coughing with secretions coming around trach hub. PMV allowing increased pressure and movement of secretions to oral cavity to be suctioned. Coughed valve off hub x 1. He was able to verbalize with good intensity and mildly hoarse quality although verbalizations minimal today. HR 97-108, RR 14-26 and SpO2 100%. Continued to cough with oral suctioning clearing secretions. He is tolerating valve and able to mobilize secretions. There was no back pressure when valve removed. Recommend he continue to wear valve with staff with full supervision, removing when leaving room, ensure cuff is deflated. He may not be able to wear for long periods if continues to cough and may need deep suctioning with nursing if warranted. If he is has decreased tolerance with future sessions, may need to wear valve with ST only.   Plans are for pt to be transferred to [facility] for cancer intervention. Recommend continue ST at [facility] for PMV and swallow (see BSE note).    HPI HPI: Patient is a 68 y.o. male with PMH: HTN, dementia, encephalopathy, MI, HLD, PAD, CAD, daily tobacco use. He presented to the hospital on 03/27/2024 for biopsy of pharyngeal mass and tracheostomy placement. He had been having difficulty swallowing x10 days and mainly was drinking fluids for his nutrition. 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 ENT following and recommended cuff deflation trial on 10/4. SLP ordered for PMV and swallow evaluation. Respiratory deflated patient's cuff on 10/5 in AM and patient tolerated this well.      SLP Plan  Continue with current plan of care           Recommendations         Patient may use Passy-Muir Speech Valve: During all therapies with supervision PMSV Supervision: Full MD: Please consider changing trach tube to : Cuffless           Oral care QID   Intermittent Supervision/Assistance A[place] (R49.1)     Continue with current plan of care     Dustin Olam Bull  03/31/2024, 8:53 AM

## 2024-03-31 NOTE — Progress Notes (Signed)
 [facility] Progress Note  1 Day Post-Op  Subjective: CC:  Resting comfortably. No new complaints.  WBC 9 from 11 Hgb 11.4 from 12.2  Objective: Vital signs in last 24 hours: Temp:  [97.7 F (36.5 C)-98.7 F (37.1 C)] 97.9 F (36.6 C) (10/07 0808) Pulse Rate:  [82-143] 82 (10/07 0808) Resp:  [9-19] 14 (10/07 0808) BP: (107-164)/(72-101) 125/73 (10/07 0808) SpO2:  [93 %-100 %] 100 % (10/07 0808) FiO2 (%):  [28 %] 28 % (10/07 0749) Weight:  [42.7 kg] 42.7 kg (10/06 1151) Last BM Date : 03/30/24  Intake/Output from previous day: 10/06 0701 - 10/07 0700 In: 985 [P.O.:120; I.V.:600; IV Piggyback:265] Out: 505 [Urine:500; Blood:5] Intake/Output this shift: No intake/output data recorded.  PE: Gen:  Alert, NAD, pleasant Card:  Regular rate and rhythm Pulm:  HTC, secretions present Abd: Soft, minimcally tender around incisions which is c/d/I  G tube LUQ clean and dry, to gravity with minimal output, bilious Skin: warm and dry, no rashes  Psych: A&Ox3   Lab Results:  Recent Labs    03/30/24 0342 03/31/24 0354  WBC 11.0* 9.4  HGB 12.2* 11.4*  HCT 37.6* 35.0*  PLT 435* 490*   BMET Recent Labs    03/30/24 0342 03/31/24 0354  NA 137 137  K 4.2 4.6  CL 104 101  CO2 23 23  GLUCOSE 82 103*  BUN 8 11  CREATININE 0.74 0.84  CALCIUM  8.6* 9.0   PT/INR No results for input(s): LABPROT, INR in the last 72 hours. CMP     Component Value Date/Time   NA 137 03/31/2024 0354   K 4.6 03/31/2024 0354   CL 101 03/31/2024 0354   CO2 23 03/31/2024 0354   GLUCOSE 103 (H) 03/31/2024 0354   BUN 11 03/31/2024 0354   CREATININE 0.84 03/31/2024 0354   CREATININE 1.07 03/02/2024 0803   CREATININE 0.92 11/13/2013 1031   CALCIUM  9.0 03/31/2024 0354   PROT 8.0 03/02/2024 0803   ALBUMIN  4.4 03/02/2024 0803   AST <10 (L) 03/02/2024 0803   ALT 8 03/02/2024 0803   ALKPHOS 87 03/02/2024 0803   BILITOT 0.5 03/02/2024 0803   GFRNONAA >60 03/31/2024 0354    GFRNONAA >60 03/02/2024 0803   GFRNONAA >89 11/13/2013 1031   GFRAA >60 01/14/2018 0436   GFRAA >89 11/13/2013 1031   Lipase     Component Value Date/Time   LIPASE 30 07/30/2016 2046       Studies/Results: No results found.  Anti-infectives: Anti-infectives (From admission, onward)    Start     Dose/Rate Route Frequency Ordered Stop   03/30/24 1216  ceFAZolin  (ANCEF ) 2-4 GM/100ML-% IVPB       Note to Pharmacy: [name] S: cabinet override      03/30/24 1216 03/30/24 1248   03/30/24 1215  ceFAZolin  (ANCEF ) IVPB 2g/100 mL premix        2 g 200 mL/hr over 30 Minutes Intravenous  Once 03/30/24 1115 03/30/24 1236        Assessment/Plan  Pharyngeal cancer, need for enteral access POD#1 s/o open stamm gastrostomy tube Ok to resume tube feeding and meds via G tube.  Surgery will sign off Call as needed.    LOS: 4 days   I reviewed nursing notes, hospitalist notes, last 24 h vitals and pain scores, last 48 h intake and output, last 24 h labs and trends, and last 24 h imaging results.  This care required straight-forward level of medical decision making.  Almarie Pringle, PA-C [facility] Please see Amion for pager number during day hours 7:00am-4:30pm

## 2024-03-31 NOTE — Plan of Care (Signed)
   Problem: Education: Goal: Knowledge of General Education information will improve Description: Including pain rating scale, medication(s)/side effects and non-pharmacologic comfort measures Outcome: Not Progressing

## 2024-03-31 NOTE — TOC Progression Note (Signed)
 Transition of Care [facility]) - Progression Note    Patient Details  Name: Stephen Bowen MRN: [id number] Date of Birth: 03-08-1956  Transition of [facility]) CM/SW Contact  Inocente GORMAN Kindle, LCSW Phone Number: 03/31/2024, 11:50 AM  Clinical Narrative:    Per Fairy at [facility], they will be able to accept patient back once medically stable (aware of trach). Plans to transfer to [facility].    Expected Discharge Plan: Skilled Nursing Facility Barriers to Discharge: Continued Medical Work up               Expected Discharge Plan and Services In-house Referral: Clinical Social Work     Living arrangements for the past 2 months: Skilled Nursing Facility                                       Social Drivers of Health (SDOH) Interventions SDOH Screenings   Food Insecurity: Patient Unable To Answer (03/27/2024)  Housing: Low Risk  (03/27/2024)  Transportation Needs: No Transportation Needs (03/27/2024)  Utilities: Not At Risk (03/27/2024)  Depression (PHQ2-9): Low Risk  (03/02/2024)  Social Connections: Patient Unable To Answer (03/27/2024)  Tobacco Use: High Risk (03/30/2024)    Readmission Risk Interventions     No data to display

## 2024-04-01 DIAGNOSIS — Z930 Tracheostomy status: Secondary | ICD-10-CM | POA: Diagnosis not present

## 2024-04-01 DIAGNOSIS — E43 Unspecified severe protein-calorie malnutrition: Secondary | ICD-10-CM | POA: Diagnosis not present

## 2024-04-01 DIAGNOSIS — J392 Other diseases of pharynx: Secondary | ICD-10-CM | POA: Diagnosis not present

## 2024-04-01 DIAGNOSIS — C140 Malignant neoplasm of pharynx, unspecified: Secondary | ICD-10-CM | POA: Diagnosis not present

## 2024-04-01 LAB — GLUCOSE, CAPILLARY
Glucose-Capillary: 130 mg/dL — ABNORMAL HIGH (ref 70–99)
Glucose-Capillary: 121 mg/dL — ABNORMAL HIGH (ref 70–99)
Glucose-Capillary: 130 mg/dL — ABNORMAL HIGH (ref 70–99)
Glucose-Capillary: 105 mg/dL — ABNORMAL HIGH (ref 70–99)
Glucose-Capillary: 119 mg/dL — ABNORMAL HIGH (ref 70–99)
Glucose-Capillary: 100 mg/dL — ABNORMAL HIGH (ref 70–99)

## 2024-04-01 LAB — BASIC METABOLIC PANEL WITH GFR
Anion gap: 11 (ref 5–15)
BUN: 9 mg/dL (ref 8–23)
CO2: 25 mmol/L (ref 22–32)
Calcium: 9 mg/dL (ref 8.9–10.3)
Chloride: 101 mmol/L (ref 98–111)
Creatinine, Ser: 0.9 mg/dL (ref 0.61–1.24)
GFR, Estimated: >60 mL/min (ref >60)
Glucose, Bld: 107 mg/dL — ABNORMAL HIGH (ref 70–99)
Potassium: 4.3 mmol/L (ref 3.5–5.1)
Sodium: 137 mmol/L (ref 135–145)

## 2024-04-01 LAB — CBC
HCT: 37.4 % — ABNORMAL LOW (ref 39.0–52.0)
Hemoglobin: 12.1 g/dL — ABNORMAL LOW (ref 13.0–17.0)
MCH: 29.7 pg (ref 26.0–34.0)
MCHC: 32.4 g/dL (ref 30.0–36.0)
MCV: 91.9 fL (ref 80.0–100.0)
Platelets: 568 K/uL — ABNORMAL HIGH (ref 150–400)
RBC: 4.07 MIL/uL — ABNORMAL LOW (ref 4.22–5.81)
RDW: 13.4 % (ref 11.5–15.5)
WBC: 11.5 K/uL — ABNORMAL HIGH (ref 4.0–10.5)
nRBC: 0 % (ref 0.0–0.2)

## 2024-04-01 LAB — MAGNESIUM: Magnesium: 1.8 mg/dL (ref 1.7–2.4)

## 2024-04-01 LAB — PHOSPHORUS: Phosphorus: 2.6 mg/dL (ref 2.5–4.6)

## 2024-04-01 NOTE — Progress Notes (Signed)
 Nutrition Brief Note  Spoke with RN this morning who reports patient is tolerating tube feedings, which are now at goal rate. Bowels stable. Visited patient at bedside. No abdominal distention observed. Patient endorses no pain.   Estimated Nutritional Needs:  Kcal:  1500-1700 kcals Protein:  70-85g Fluid:  1.5-1.7L/day  Per MD, he will transfer to [facility] when bed available for radiation therapy. Tracheostomy remains in place. MBS still pending in next several days when patient transfers to [facility].   Continue tube feeding via PEG: Osmolite 1.2 at 55 ml/h (1320 ml per day) FWF 100 ml q6h (400 ml per day) Continuemagnesium, potassium, and phosphorus daily for at least 3 days, MD to replete as needed Continue Thiamine  100 mg daily for 5 days    Provides 1584 kcal, 73 gm protein, 1482 ml free water daily    Continue MVI w/ minerals     NUTRITION DIAGNOSIS:  Severe Malnutrition related to chronic illness as evidenced by severe fat depletion, severe muscle depletion, percent weight loss. - remains applicable   GOAL:  Patient will meet greater than or equal to 90% of their needs - meeting via TF regimen  Blair Deaner MS, RD, LDN Registered Dietitian Clinical Nutrition RD Inpatient Contact Info in Amion

## 2024-04-01 NOTE — Care Management Important Message (Signed)
 Important Message  Patient Details  Name: Stephen Bowen MRN: [id number] Date of Birth: 11/20/1955   Important Message Given:  Yes - Medicare IM     Claretta Deed 04/01/2024, 4:14 PM

## 2024-04-01 NOTE — Plan of Care (Signed)
  Problem: Education: Goal: Knowledge of General Education information will improve Description: Including pain rating scale, medication(s)/side effects and non-pharmacologic comfort measures Outcome: Progressing   Problem: Health Behavior/Discharge Planning: Goal: Ability to manage health-related needs will improve Outcome: Progressing   Problem: Clinical Measurements: Goal: Ability to maintain clinical measurements within normal limits will improve Outcome: Progressing Goal: Will remain free from infection Outcome: Progressing Goal: Diagnostic test results will improve Outcome: Progressing Goal: Respiratory complications will improve Outcome: Progressing Goal: Cardiovascular complication will be avoided Outcome: Progressing   Problem: Activity: Goal: Risk for activity intolerance will decrease Outcome: Progressing   Problem: Nutrition: Goal: Adequate nutrition will be maintained Outcome: Progressing   Problem: Coping: Goal: Level of anxiety will decrease Outcome: Progressing   Problem: Elimination: Goal: Will not experience complications related to bowel motility Outcome: Progressing Goal: Will not experience complications related to urinary retention Outcome: Progressing   Problem: Pain Managment: Goal: General experience of comfort will improve and/or be controlled Outcome: Progressing   Problem: Safety: Goal: Ability to remain free from injury will improve Outcome: Progressing   Problem: Skin Integrity: Goal: Risk for impaired skin integrity will decrease Outcome: Progressing   Problem: Education: Goal: Knowledge about tracheostomy care/management will improve Outcome: Progressing   Problem: Activity: Goal: Ability to tolerate increased activity will improve Outcome: Progressing   Problem: Health Behavior/Discharge Planning: Goal: Ability to manage tracheostomy will improve Outcome: Progressing   Problem: Respiratory: Goal: Patent airway  maintenance will improve Outcome: Progressing   Problem: Role Relationship: Goal: Ability to communicate will improve Outcome: Progressing   Problem: Education: Goal: Ability to describe self-care measures that may prevent or decrease complications (Diabetes Survival Skills Education) will improve Outcome: Progressing Goal: Individualized Educational Video(s) Outcome: Progressing   Problem: Coping: Goal: Ability to adjust to condition or change in health will improve Outcome: Progressing   Problem: Fluid Volume: Goal: Ability to maintain a balanced intake and output will improve Outcome: Progressing   Problem: Health Behavior/Discharge Planning: Goal: Ability to identify and utilize available resources and services will improve Outcome: Progressing Goal: Ability to manage health-related needs will improve Outcome: Progressing   Problem: Metabolic: Goal: Ability to maintain appropriate glucose levels will improve Outcome: Progressing   Problem: Nutritional: Goal: Maintenance of adequate nutrition will improve Outcome: Progressing Goal: Progress toward achieving an optimal weight will improve Outcome: Progressing   Problem: Skin Integrity: Goal: Risk for impaired skin integrity will decrease Outcome: Progressing   Problem: Tissue Perfusion: Goal: Adequacy of tissue perfusion will improve Outcome: Progressing   Problem: Activity: Goal: Ability to tolerate increased activity will improve Outcome: Progressing   Problem: Respiratory: Goal: Ability to maintain a clear airway and adequate ventilation will improve Outcome: Progressing   Problem: Role Relationship: Goal: Method of communication will improve Outcome: Progressing

## 2024-04-01 NOTE — Progress Notes (Signed)
 PT Cancellation Note  Patient Details Name: Stephen Bowen MRN: [id number] DOB: 20-Jun-1956   Cancelled Treatment:    Reason Eval/Treat Not Completed: Patient declined, no reason specified (Pt declined stating that he did not feel good and was having lots of secretions. Politely requests PT to try again tomorrow.)   Jermisha Hoffart 04/01/2024, 3:03 PM

## 2024-04-01 NOTE — Plan of Care (Signed)
  Problem: Clinical Measurements: Goal: Diagnostic test results will improve Outcome: Progressing   Problem: Nutrition: Goal: Adequate nutrition will be maintained Outcome: Progressing   Problem: Activity: Goal: Ability to tolerate increased activity will improve Outcome: Progressing   Problem: Respiratory: Goal: Patent airway maintenance will improve Outcome: Progressing

## 2024-04-01 NOTE — Progress Notes (Signed)
 PROGRESS NOTE        PATIENT DETAILS Name: Stephen Bowen Age: 68 y.o. Sex: male Date of Birth: May 17, 1956 Admit Date: 03/27/2024 Admitting Physician Toribio JAYSON Sharps, MD ERE:Ynihzd, Eric, MD  Brief Summary: Patient is a 68 y.o.  male with history of HTN, HLD, CAD, PAD-s/p bilateral AKA, dementia-who was brought in by ENT for pharyngeal mass biopsy/tracheotomy (history of solid food dysphagia/hemoptysis-outpatient scan showed pharyngeal wall thickening).  Postbiopsy/tracheotomy-patient was vent dependent and was admitted to the ICU-stabilized and then transferred to [facility].  Significant events: 10/3>> largyngoscopy, biopsy, tracheostomy  10/4>> transition to trach collar 10/6>> PEG tube placed by general surgery-transferred to [facility].  Significant studies: 10/5>> CT angio neck: Nodular thickening/enhancement within the posterior pharyngeal wall of oral/hypopharynx bilaterally.  Significant microbiology data: None  Procedures: 10/3>> tracheotomy/ biopsy by ENT 10/6>> PEG tube placement by general surgery.  Consults: None  Subjective: Lying comfortably in bed-denies any chest pain or shortness of breath.  Appears frustrated-unable to talk.  Objective: Vitals: Blood pressure (!) 155/98, pulse 69, temperature (!) 97.5 F (36.4 C), temperature source Axillary, resp. rate 18, height 5' 6 (1.676 m), weight 42.7 kg, SpO2 100%.   Exam: Gen Exam:Alert awake-not in any distress HEENT:atraumatic, normocephalic Chest: B/L clear to auscultation anteriorly CVS:S1S2 regular Abdomen:soft non tender, non distended Extremities: B/L AKA. 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 Sherlon discussed with ENT-transferred to [facility] for initiation of radiation therapy.  Dr. Izell will follow when patient arrives at [facility]. Continue routine trach care.  Solid food dysphagia Secondary to pharyngeal mass/squamous cell carcinoma PEG tube inserted by general surgery 10/6 Tube feeds per nutrition service. SLP following-MBS planned over the next several days when patient transfers to [facility].  Hemoptysis Secondary to pharyngeal mass Resolved.  History of PAD-s/p bilateral AKA History of CVA History of CAD Stable Continue aspirin /statin/fenofibrate/beta-blocker  DM-2 (A1c 5.9 on 10/3) CBG stable on SSI  HTN BP stable Continue metoprolol .  Seizure disorder Dementia On Keppra Delirium precautions  Nutrition Status: Nutrition Problem: Severe Malnutrition Etiology: chronic illness Signs/Symptoms: severe fat depletion, severe muscle depletion, percent weight loss Interventions: Refer to RD note for recommendations  Pressure Ulcer: Agree with assessment and plan as outlined below. Wound 03/27/24 1903 Pressure Injury Coccyx Medial;Upper Deep Tissue Pressure Injury - Purple or maroon localized area of discolored intact skin or blood-filled blister due to damage of underlying soft tissue from pressure and/or shear. (Active)   Code status:  Code Status: Prior   DVT Prophylaxis: heparin  injection 5,000 Units Start: 03/29/24 1515   Family Communication: None at bedside   Disposition Plan: Status is: Inpatient Remains inpatient  appropriate because: Severity of illness   Planned Discharge Destination:Skilled nursing facility   Diet: Diet Order             Diet NPO time specified  Diet effective now                     Antimicrobial agents: Anti-infectives (From admission, onward)    Start     Dose/Rate Route Frequency Ordered Stop   03/30/24 1216  ceFAZolin  (ANCEF ) 2-4 GM/100ML-% IVPB       Note to Pharmacy: [name] S: cabinet override      03/30/24 1216 03/30/24 1248   03/30/24 1215  ceFAZolin  (ANCEF ) IVPB 2g/100 mL premix        2 g 200 mL/hr over 30 Minutes Intravenous  Once 03/30/24 1115 03/30/24 1236        MEDICATIONS: Scheduled Meds:  aspirin   81 mg Per Tube Daily   atorvastatin   40 mg Per Tube q1800   Chlorhexidine  Gluconate Cloth  6 each Topical Q0600   docusate  100 mg Per Tube BID   famotidine  20 mg Per Tube BID   free water  100 mL Per Tube Q6H   heparin  injection (subcutaneous)  5,000 Units Subcutaneous Q8H   insulin  aspart  0-9 Units Subcutaneous Q4H   levETIRAcetam  500 mg Intravenous Q12H   methocarbamol  (ROBAXIN ) injection  500 mg Intravenous Q8H   metoprolol  tartrate  12.5 mg Per Tube BID   multivitamin with minerals  1 tablet Per Tube Daily   mouth rinse  15 mL Mouth Rinse 4 times per day   polyethylene glycol  17 g Per Tube Daily   scopolamine  1 patch Transdermal Q72H   thiamine   100 mg Per Tube Daily   Continuous Infusions:  feeding supplement (OSMOLITE 1.2 CAL) 1,000 mL (03/31/24 1445)   PRN Meds:.hydrALAZINE , HYDROmorphone  (DILAUDID ) injection, mouth rinse, traZODone   I have personally reviewed following labs and imaging studies  LABORATORY DATA: CBC: Recent Labs  Lab 03/27/24 1756 03/27/24 1758 03/29/24 0309 03/30/24 0342 03/31/24 0354 04/01/24 0248  WBC 7.9  --  9.1 11.0* 9.4 11.5*  HGB 10.8*  --  10.8* 12.2* 11.4* 12.1*  HCT 33.2*  --  33.4* 37.6* 35.0* 37.4*  MCV 94.6  --  92.3 93.3 92.3 91.9  PLT 408* 433* 409* 435* 490* 568*     Basic Metabolic Panel: Recent Labs  Lab 03/29/24 0309 03/30/24 0342 03/31/24 0354 04/01/24 0248  NA 131* 137 137 137  K 3.1* 4.2 4.6 4.3  CL 100 104 101 101  CO2 23 23 23 25   GLUCOSE 360* 82 103* 107*  BUN 5* 8 11 9   CREATININE 0.73 0.74 0.84 0.90  CALCIUM  8.1* 8.6* 9.0 9.0  MG 1.4* 2.0 1.9 1.8  PHOS 3.1 3.4 4.3 2.6    GFR: Estimated Creatinine Clearance: 47.4 mL/min (by C-G formula based on SCr of 0.9 mg/dL).  Liver Function Tests: No results for input(s): AST, ALT, ALKPHOS, BILITOT, PROT, ALBUMIN  in the last 168 hours. No results for input(s): LIPASE, AMYLASE in the last 168 hours. No results for input(s): AMMONIA in the last 168 hours.  Coagulation Profile: Recent Labs  Lab 03/27/24 1758  INR 1.1    Cardiac Enzymes: No results for input(s): CKTOTAL, CKMB, CKMBINDEX, TROPONINI  in the last 168 hours.  BNP (last 3 results) No results for input(s): PROBNP in the last 8760 hours.  Lipid Profile: No results for input(s): CHOL, HDL, LDLCALC, TRIG, CHOLHDL, LDLDIRECT in the last 72 hours.  Thyroid Function Tests: No results for input(s): TSH, T4TOTAL, FREET4, T3FREE, THYROIDAB in the last 72 hours.  Anemia Panel: No results for input(s): VITAMINB12, FOLATE, FERRITIN, TIBC, IRON, RETICCTPCT in the last 72 hours.  Urine analysis:    Component Value Date/Time   COLORURINE YELLOW 11/08/2017 0031   APPEARANCEUR CLEAR 11/08/2017 0031   LABSPEC 1.016 11/08/2017 0031   PHURINE 5.0 11/08/2017 0031   GLUCOSEU NEGATIVE 11/08/2017 0031   HGBUR SMALL (A) 11/08/2017 0031   BILIRUBINUR NEGATIVE 11/08/2017 0031   KETONESUR 20 (A) 11/08/2017 0031   PROTEINUR NEGATIVE 11/08/2017 0031   UROBILINOGEN 0.2 11/13/2013 1634   NITRITE NEGATIVE 11/08/2017 0031   LEUKOCYTESUR NEGATIVE 11/08/2017 0031    Sepsis Labs: Lactic Acid, Venous    Component Value Date/Time   LATICACIDVEN 0.69 11/09/2017 0745     MICROBIOLOGY: No results found for this or any previous visit (from the past 240 hours).  RADIOLOGY STUDIES/RESULTS: No results found.   LOS: 5 days   Donalda Applebaum, MD  [facility]    To contact the attending provider between 7A-7P or the covering provider during after hours 7P-7A, please log into the web site [email or web address] and access using universal [place] password for that web site. If you do not have the password, please call the hospital operator.  04/01/2024, 11:33 AM

## 2024-04-02 DIAGNOSIS — J392 Other diseases of pharynx: Secondary | ICD-10-CM | POA: Diagnosis not present

## 2024-04-02 LAB — GLUCOSE, CAPILLARY
Glucose-Capillary: 123 mg/dL — ABNORMAL HIGH (ref 70–99)
Glucose-Capillary: 113 mg/dL — ABNORMAL HIGH (ref 70–99)
Glucose-Capillary: 118 mg/dL — ABNORMAL HIGH (ref 70–99)
Glucose-Capillary: 103 mg/dL — ABNORMAL HIGH (ref 70–99)

## 2024-04-02 MED ORDER — OXYCODONE HCL 5 MG PO TABS
5.0000 mg | ORAL_TABLET | Freq: Four times a day (QID) | ORAL | Status: DC | PRN
  Administered 2024-04-03 – 2024-04-07 (×2): 5 mg
  Filled 2024-04-02 (×2): qty 1

## 2024-04-02 MED ORDER — DICLOFENAC SODIUM 1 % EX GEL
2.0000 g | Freq: Four times a day (QID) | CUTANEOUS | Status: DC
  Administered 2024-04-02 – 2024-04-19 (×22): 2 g via TOPICAL
  Filled 2024-04-02 (×2): qty 100

## 2024-04-02 NOTE — Progress Notes (Signed)
 Oncology Nurse Navigator Documentation   Mr. Stephen Bowen has been transferred to [facility] at the request of Dr. Izell to begin radiation. We are in the process of getting him scheduled for a consult with Dr. Izell and a radiation planning time. His POA and inpatient team will be notified when an appointment date/time has been set.   Delon Jefferson RN, BSN, OCN Head & Neck Oncology Nurse Navigator [facility] at [facility] Phone # [phone]  Fax # [phone]

## 2024-04-02 NOTE — Progress Notes (Addendum)
 Patient very irritable, refusing to be turned on schedule, refusing to have mouth suctioned, refusing some meds. I encouraged patient to turn and prevent worsening of sacral pressure injury.   Patient denies pain but then flinches when touched. Offered patient pain medication which he refused.     I discussed with patient that he is in the hospital and our goal is to provide the best care for him which includes turning and having mouth suctioned.  Patient mouthed I can't take it  I also mentioned radiation consult for tomorrow.  Patient threw hands up as if to indicate frustration. I asked him if he had spoken to anyone about radiation and he shook his head and mouthed no one talks to me  Offered patient paper and pen to write which he refused. Also offered to put in speaking valve which he refused.

## 2024-04-02 NOTE — Progress Notes (Signed)
 PT Cancellation Note  Patient Details Name: Stephen Bowen MRN: [id number] DOB: May 13, 1956   Cancelled Treatment:    Reason Eval/Treat Not Completed: Patient declined, no reason specified (pt refused all mobility, risks of bedrest and benefits of mobility were explained to pt, he continued to refuse activity. Will follow.)   Sylvan Delon Copp PT 04/02/2024  Acute Rehabilitation Services  Office [phone]

## 2024-04-02 NOTE — Progress Notes (Signed)
 PROGRESS NOTE    Stephen Bowen  FMW:[id number] DOB: Jan 09, 1956 DOA: 03/27/2024 PCP: Trinidad Glisson, MD   Brief Narrative:  Patient is a 68 y.o.  male with history of HTN, HLD, CAD, PAD-s/p bilateral AKA, dementia-who was brought in by ENT for pharyngeal mass biopsy/tracheotomy (history of solid food dysphagia/hemoptysis-outpatient scan showed pharyngeal wall thickening).  Postbiopsy/tracheotomy-patient was vent dependent and was admitted to the ICU-stabilized and then transferred to [facility] on 03/30/2024.  And then transferred to [facility] on 04/02/2024 for radiation.  Significant events: 10/3>> largyngoscopy, biopsy, tracheostomy  10/4>> transition to trach collar 10/6>> PEG tube placed by general surgery-transferred to [facility].  Assessment & Plan:   Principal Problem:   Hypopharyngeal mass Active Problems:   Tracheostomy tube present (HCC)   Squamous cell carcinoma of pharynx (HCC)   Protein-calorie malnutrition, severe  Pharyngeal mass-squamous cell carcinoma on biopsy-s/p tracheotomy ENT following. Tracheotomy in place-keep cuffed trach in place/don't change to cuffless trach in case there is recurrent oral bleeding and the cuff can be re-inflated to protect the airway. Prior MD Dr. Sherlon discussed with ENT-transferred to [facility] for initiation of radiation therapy.  Dr. Izell will follow.  I have sent a message to Dr. Izell as well.   Solid food dysphagia Secondary to pharyngeal mass/squamous cell carcinoma PEG tube inserted by general surgery 10/6 Tube feeds per nutrition service. SLP following-MBS planned over the next several days when patient transfers to [place] [facility].   Hemoptysis Secondary to pharyngeal mass Resolved.   History of PAD-s/p bilateral AKA History of CVA History of CAD Stable Continue aspirin /statin/fenofibrate/beta-blocker   DM-2 (A1c 5.9 on 10/3) CBG stable on SSI   HTN BP stable Continue metoprolol .   Seizure disorder Dementia On  Keppra Delirium precautions   Nutrition Status: Nutrition Problem: Severe Malnutrition Etiology: chronic illness Signs/Symptoms: severe fat depletion, severe muscle depletion, percent weight loss Interventions: Refer to RD note for recommendations  DVT prophylaxis: heparin  injection 5,000 Units Start: 03/29/24 1515   Code Status: Prior  Family Communication:  None present at bedside.  Plan of care discussed with patient   Status is: Inpatient Remains inpatient appropriate because: Will start radiation today   Estimated body mass index is 15.19 kg/m as calculated from the following:   Height as of this encounter: 5' 6 (1.676 m).   Weight as of this encounter: 42.7 kg.  Wound 03/27/24 1903 Pressure Injury Coccyx Medial;Upper Deep Tissue Pressure Injury - Purple or maroon localized area of discolored intact skin or blood-filled blister due to damage of underlying soft tissue from pressure and/or shear. (Active)   Nutritional Assessment: Body mass index is 15.19 kg/m.SABRA Seen by dietician.  I agree with the assessment and plan as outlined below: Nutrition Status: Nutrition Problem: Severe Malnutrition Etiology: chronic illness Signs/Symptoms: severe fat depletion, severe muscle depletion, percent weight loss Interventions: Refer to RD note for recommendations  . Skin Assessment: I have examined the patient's skin and I agree with the wound assessment as performed by the wound care RN as outlined below: Wound 03/27/24 1903 Pressure Injury Coccyx Medial;Upper Deep Tissue Pressure Injury - Purple or maroon localized area of discolored intact skin or blood-filled blister due to damage of underlying soft tissue from pressure and/or shear. (Active)    Consultants:  General surgery, critical care, ENT  Procedures:  10/3>> tracheotomy/ biopsy by ENT 10/6>> PEG tube placement by general surgery.  Antimicrobials:  Anti-infectives (From admission, onward)    Start     Dose/Rate  Route Frequency  Ordered Stop   03/30/24 1216  ceFAZolin  (ANCEF ) 2-4 GM/100ML-% IVPB       Note to Pharmacy: [name] S: cabinet override      03/30/24 1216 03/30/24 1248   03/30/24 1215  ceFAZolin  (ANCEF ) IVPB 2g/100 mL premix        2 g 200 mL/hr over 30 Minutes Intravenous  Once 03/30/24 1115 03/30/24 1236         Subjective: Seen and examined.  Patient with tracheostomy.  Unable to talk but fully alert and oriented and able to communicate.  Does not have any complaint.  Objective: Vitals:   04/01/24 2353 04/02/24 0054 04/02/24 0135 04/02/24 0317  BP: 135/81 (!) 154/95    Pulse: 71 92  97  Resp: 16 19    Temp: 98.5 F (36.9 C) 98.1 F (36.7 C)    TempSrc:  Oral    SpO2: 94% 96% 97% 94%  Weight:      Height:        Intake/Output Summary (Last 24 hours) at 04/02/2024 0751 Last data filed at 04/02/2024 0544 Gross per 24 hour  Intake 1630.75 ml  Output 400 ml  Net 1230.75 ml   Filed Weights   03/27/24 1016 03/27/24 1708 03/30/24 1151  Weight: 51.1 kg 42.7 kg 42.7 kg    Examination:  General exam: Appears calm and comfortable, has tracheostomy Respiratory system: Clear to auscultation. Respiratory effort normal. Cardiovascular system: S1 & S2 heard, RRR. No JVD, murmurs, rubs, gallops or clicks. No pedal edema. Gastrointestinal system: Abdomen is nondistended, soft and nontender. No organomegaly or masses felt. Normal bowel sounds heard.  Has PEG tube. Central nervous system: Alert and oriented. No focal neurological deficits. Extremities: Symmetric 5 x 5 power. Skin: No rashes, lesions or ulcers Psychiatry: Judgement and insight appear normal. Mood & affect appropriate.    Data Reviewed: I have personally reviewed following labs and imaging studies  CBC: Recent Labs  Lab 03/27/24 1756 03/27/24 1758 03/29/24 0309 03/30/24 0342 03/31/24 0354 04/01/24 0248  WBC 7.9  --  9.1 11.0* 9.4 11.5*  HGB 10.8*  --  10.8* 12.2* 11.4* 12.1*  HCT 33.2*  --   33.4* 37.6* 35.0* 37.4*  MCV 94.6  --  92.3 93.3 92.3 91.9  PLT 408* 433* 409* 435* 490* 568*   Basic Metabolic Panel: Recent Labs  Lab 03/29/24 0309 03/30/24 0342 03/31/24 0354 04/01/24 0248  NA 131* 137 137 137  K 3.1* 4.2 4.6 4.3  CL 100 104 101 101  CO2 23 23 23 25   GLUCOSE 360* 82 103* 107*  BUN 5* 8 11 9   CREATININE 0.73 0.74 0.84 0.90  CALCIUM  8.1* 8.6* 9.0 9.0  MG 1.4* 2.0 1.9 1.8  PHOS 3.1 3.4 4.3 2.6   GFR: Estimated Creatinine Clearance: 47.4 mL/min (by C-G formula based on SCr of 0.9 mg/dL). Liver Function Tests: No results for input(s): AST, ALT, ALKPHOS, BILITOT, PROT, ALBUMIN  in the last 168 hours. No results for input(s): LIPASE, AMYLASE in the last 168 hours. No results for input(s): AMMONIA in the last 168 hours. Coagulation Profile: Recent Labs  Lab 03/27/24 1758  INR 1.1   Cardiac Enzymes: No results for input(s): CKTOTAL, CKMB, CKMBINDEX, TROPONINI in the last 168 hours. BNP (last 3 results) No results for input(s): PROBNP in the last 8760 hours. HbA1C: No results for input(s): HGBA1C in the last 72 hours. CBG: Recent Labs  Lab 04/01/24 0811 04/01/24 1117 04/01/24 1521 04/01/24 2014 04/02/24 0729  GLUCAP 130* 105*  119* 100* 123*   Lipid Profile: No results for input(s): CHOL, HDL, LDLCALC, TRIG, CHOLHDL, LDLDIRECT in the last 72 hours. Thyroid Function Tests: No results for input(s): TSH, T4TOTAL, FREET4, T3FREE, THYROIDAB in the last 72 hours. Anemia Panel: No results for input(s): VITAMINB12, FOLATE, FERRITIN, TIBC, IRON, RETICCTPCT in the last 72 hours. Sepsis Labs: No results for input(s): PROCALCITON, LATICACIDVEN in the last 168 hours.  No results found for this or any previous visit (from the past 240 hours).   Radiology Studies: No results found.  Scheduled Meds:  aspirin   81 mg Per Tube Daily   atorvastatin   40 mg Per Tube q1800   Chlorhexidine  Gluconate  Cloth  6 each Topical Q0600   docusate  100 mg Per Tube BID   famotidine  20 mg Per Tube BID   free water  100 mL Per Tube Q6H   heparin  injection (subcutaneous)  5,000 Units Subcutaneous Q8H   insulin  aspart  0-9 Units Subcutaneous Q4H   levETIRAcetam  500 mg Intravenous Q12H   methocarbamol  (ROBAXIN ) injection  500 mg Intravenous Q8H   metoprolol  tartrate  12.5 mg Per Tube BID   multivitamin with minerals  1 tablet Per Tube Daily   mouth rinse  15 mL Mouth Rinse 4 times per day   polyethylene glycol  17 g Per Tube Daily   scopolamine  1 patch Transdermal Q72H   thiamine   100 mg Per Tube Daily   Continuous Infusions:  feeding supplement (OSMOLITE 1.2 CAL) 1,000 mL (04/02/24 0246)     LOS: 6 days   Fredia Skeeter, MD [facility]  04/02/2024, 7:51 AM   *Please note that this is a verbal dictation therefore any spelling or grammatical errors are due to the Dragon Medical One system interpretation.  Please page via Amion and do not message via secure chat for urgent patient care matters. Secure chat can be used for non urgent patient care matters.  How to contact the [facility] Attending or Consulting provider 7A - 7P or covering provider during after hours 7P -7A, for this patient?  Check the care team in [facility] and look for a) attending/consulting [facility] provider listed and b) the [facility] team listed. Page or secure chat 7A-7P. Log into [email or web address] and use [place]'s universal password to access. If you do not have the password, please contact the hospital operator. Locate the [facility] provider you are looking for under [facility] and page to a number that you can be directly reached. If you still have difficulty reaching the provider, please page the [facility] (Director on Call) for the Hospitalists listed on amion for assistance.

## 2024-04-02 NOTE — Progress Notes (Signed)
 Radiation Oncology         [phone] ________________________________  Initial Outpatient Consultation  Name: Stephen Bowen MRN: [id number]  Date: 04/03/2024  DOB: 06-Aug-1955  CC:Trinidad Glisson, MD  Okey Burns, MD   REFERRING PHYSICIAN: Okey Burns, MD  DIAGNOSIS: No diagnosis found.   Cancer Staging  No matching staging information was found for the patient.  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 male who presented to medical attention earlier last month with c/o dysphagia for at least 1 week. A neck CT was subsequently performed on 02/27/24 which demonstrated an area of irregular soft tissue thickening vs mass in the posterior hypopharynx, measuring approximately 3.5 cm, extending into the piriform sinuses concerning for malignancy.  A chest CT was also performed at that time which showed no acute cardiopulmonary findings.   He was accordingly referred to Med-Onc for further management and was seen by NP Harl at the [facility] on 03/02/24. Further imaging for biopsy planning was recommended and he promptly presented for a staging PET scan on 03/09/24 which demonstrated: marked hypermetabolism associated with the large mass occupying the entire posterior hypopharynx, consistent with primary neoplasm. PET imaging otherwise showed no evidence of hypermetabolic cervical adenopathy or evidence of metastatic disease.   He was then referred to Dr. Soldatova at [facility] on 03/10/24 for further management. Laryngoscopy performed at that time confirmed a mass in the area of concern along the pharyngeal muscles, just above the level of the larynx. No other abnormalities were visualized.   Per Dr. Vallery recommendations, he was admitted at [facility] for biopsies of the pharyngeal lesion under  anesthesia on 03/27/24. Procedural findings included extension of the mass along the entire posterior pharyngeal wall from the soft palate to post-cricoid area, with left tonsillar tissue involvement also visualized. Biopsies x2 were obtained from the mass at that time and submitted for pathology. Results showed findings consistent with invasive moderately differentiated squamous cell carcinoma; p16 negative.   Although biopsies of the mass were obtained successfully, the biopsy sites began to ooze following hemostasis (via cauterization). Given the risk of recurrent bleeding after extubation (and the risk of difficulties with intubating during active bleeding) the decision was made to proceed with a tracheostomy.   He was then transferred to the ICU in stable condition. He did remain intubated for the first 24 hours after his procedure. He also underwent G-tube placement on 03/30/24.   A CT AP was performed following g-tube placement on 10/05 which demonstrated debris in the bronchus intermedius and left lower lobe airway along with central collapse/a consolidative opacity in the left upper lobe concerning for aspiration. An associated infectious/inflammatory consolidative opacity was also demonstrated in the central left lower lobe. (Of note: this study was however degraded by beam hardening artifact emanating from retained barium contrast material in the colon.   He also has a CTA of the neck performed on 10/05 due to new oral bleeding (and to rule out further tumor extension) which demonstrated: nodular thickening and enhancement within the posterior pharyngeal wall of the oral pharynx and hypopharynx bilaterally, more extensive on the left, without evidence of active bleeding or major vessel invasion.  He was transferred to [facility] yesterday to initiate radiation therapy which we will discuss in detail today.    Swallowing issues, if any: presented with difficulty swallowing  --  underwent tracheostomy  secondary to biopsy complications on 10/03  -- G-tube placed on 10/06  Weight Changes: ***  Pain status: ***  Other symptoms: ***  Tobacco history, if any: everyday smoker with a 120 pack-year smoking history - current use consists of approximately 6 cigarettes daily.   ETOH abuse, if any: does not currently drink (used to consume approximately 14.0 standard drinks of alcohol per week)   Prior cancers, if any: none  Ambulatory status: Bilateral AKA - resides at [facility] in [place]   PREVIOUS RADIATION THERAPY: No  PAST MEDICAL HISTORY:  has a past medical history of CKD (chronic kidney disease), stage II, Coronary artery disease, Dementia (HCC), Diabetes mellitus without complication (HCC), Encephalopathy acute (07/2016), HTN (hypertension), Hyperlipemia, Myocardial infarction (HCC), Non-healing surgical wound, Noncompliance, PAD (peripheral artery disease), S/P AKA (above knee amputation) bilateral (HCC), and Tobacco abuse.    PAST SURGICAL HISTORY: Past Surgical History:  Procedure Laterality Date   ABDOMINAL AORTOGRAM W/LOWER EXTREMITY N/A 08/02/2016   Procedure: Abdominal Aortogram w/Lower Extremity;  Surgeon: Penne Lonni Colorado, MD;  Location: [facility] INVASIVE CV LAB;  Service: Cardiovascular;  Laterality: N/A;   ABOVE KNEE LEG AMPUTATION Right 12/04/2016   AMPUTATION Left 06/25/2016   Procedure: AMPUTATION ABOVE KNEE;  Surgeon: Gaile LELON New, MD;  Location: [facility] OR;  Service: Vascular;  Laterality: Left;   AMPUTATION Right 12/04/2016   Procedure: RIGHT AMPUTATION ABOVE KNEE;  Surgeon: Colorado Penne Lonni, MD;  Location: [facility] OR;  Service: Vascular;  Laterality: Right;   AMPUTATION Left 10/08/2017   Procedure: LEFT ABOVE KNEE AMPUTATION REVISION;  Surgeon: Colorado Penne Lonni, MD;  Location: [facility] OR;  Service: Vascular;  Laterality: Left;   APPLICATION OF WOUND VAC Left 02/09/2021   Procedure: APPLICATION OF WOUND VAC;  Surgeon: Colorado Penne Lonni,  MD;  Location: [facility] OR;  Service: Vascular;  Laterality: Left;   BYPASS GRAFT POPLITEAL TO TIBIAL Left 06/21/2016   Procedure: BYPASS GRAFT POPLITEAL TO TIBIAL USING GORE PROPATEN GRAFT;  Surgeon: Penne Lonni Colorado, MD;  Location: [facility] OR;  Service: Vascular;  Laterality: Left;   CREATION, GASTROSTOMY, OPEN N/A 03/30/2024   Procedure: CREATION, GASTROSTOMY, OPEN;  Surgeon: Ebbie Cough, MD;  Location: [facility] OR;  Service: General;  Laterality: N/A;   EMBOLECTOMY Left 06/21/2016   Procedure: EMBOLECTOMY left lower extremity.;  Surgeon: Penne Lonni Colorado, MD;  Location: [facility] OR;  Service: Vascular;  Laterality: Left;   ENDARTERECTOMY FEMORAL Left 02/16/2016   Procedure: LEFT FEMORAL ENDARTERECTOMY;  Surgeon: Penne Lonni Colorado, MD;  Location: [facility] OR;  Service: Vascular;  Laterality: Left;   FEMORAL BYPASS  02/16/2016   LEFT FEMORAL-POSTERIOR TIBIAL ARTERY BYPASS  WITH PROPATEN 6 MM X 80 CM VASCULAR RING GRAFT (Left)   FEMORAL-TIBIAL BYPASS GRAFT Left 02/16/2016   Procedure: LEFT FEMORAL-POSTERIOR TIBIAL ARTERY BYPASS  WITH PROPATEN 6 MM X 80 CM VASCULAR RING GRAFT;  Surgeon: Penne Lonni Colorado, MD;  Location: [facility] OR;  Service: Vascular;  Laterality: Left;   FRACTURE SURGERY     I & D EXTREMITY Left 09/03/2016   Procedure: IRRIGATION AND DEBRIDEMENT EXTREMITY - LEFT AKA;  Surgeon: Penne Lonni Colorado, MD;  Location: [facility] OR;  Service: Vascular;  Laterality: Left;   I & D EXTREMITY Left 01/09/2018   Procedure: IRRIGATION AND DEBRIDEMENT GROIN;  Surgeon: [place] Penne Lonni, MD;  Location: [facility] OR;  Service: Vascular;  Laterality: Left;   INCISION AND DRAINAGE Left 02/09/2021   Procedure: INCISION AND DRAINAGE OF LEFT GROIN WOUND;  Surgeon: Colorado,  Penne Bruckner, MD;  Location: [facility] OR;  Service: Vascular;  Laterality: Left;   MICROLARYNGOSCOPY WITH LASER N/A 03/27/2024   Procedure: DIRECT MICROLARYNGOSCOPY  AND BIOPSY;  Surgeon: Okey Burns, MD;  Location: [facility] OR;  Service:  ENT;  Laterality: N/A;   PATCH ANGIOPLASTY Left 02/16/2016   Procedure: PATCH ANGIOPLASTY WITH GEORGE BIOLOGIC PATCH 1 CM X 6 CM;  Surgeon: Penne Bruckner Colorado, MD;  Location: [facility] OR;  Service: Vascular;  Laterality: Left;   PATCH ANGIOPLASTY Left 01/09/2018   Procedure: PATCH ANGIOPLASTY OF LEFT COMMON FEMORAL AND PROFUNDA  USING SAPHENOUS VEIN;  Surgeon: [place] Penne Bruckner, MD;  Location: [facility] OR;  Service: Vascular;  Laterality: Left;   PERIPHERAL VASCULAR BALLOON ANGIOPLASTY Right 08/02/2016   Procedure: Peripheral Vascular Balloon Angioplasty;  Surgeon: Penne Bruckner Colorado, MD;  Location: [facility] INVASIVE CV LAB;  Service: Cardiovascular;  Laterality: Right;  peroneal artery   PERIPHERAL VASCULAR CATHETERIZATION N/A 07/07/2015   Procedure: Abdominal Aortogram w/Lower Extremity;  Surgeon: Redell LITTIE Door, MD;  Location: [facility] INVASIVE CV LAB;  Service: Cardiovascular;  Laterality: N/A;   PERIPHERAL VASCULAR CATHETERIZATION N/A 02/15/2016   Procedure: Abdominal Aortogram w/Lower Extremity;  Surgeon: Penne Bruckner Colorado, MD;  Location: [facility] INVASIVE CV LAB;  Service: Cardiovascular;  Laterality: N/A;   PERIPHERAL VASCULAR CATHETERIZATION Bilateral 06/20/2016   Procedure: Lower Extremity Angiography;  Surgeon: Penne Bruckner Colorado, MD;  Location: [facility] INVASIVE CV LAB;  Service: Cardiovascular;  Laterality: Bilateral;  limited runoff on rt leg thrombolysis lt leg bypass graft   PERIPHERAL VASCULAR INTERVENTION Right 08/02/2016   Procedure: Peripheral Vascular Intervention;  Surgeon: Penne Bruckner Colorado, MD;  Location: [facility] INVASIVE CV LAB;  Service: Cardiovascular;  Laterality: Right;  Femoral , popliteal   TIBIA FRACTURE SURGERY Left 2000   per patient, he has rod inside his left leg.   TRACHEOSTOMY TUBE PLACEMENT N/A 03/27/2024   Procedure: CREATION, TRACHEOSTOMY;  Surgeon: Okey Burns, MD;  Location: [facility] OR;  Service: ENT;  Laterality: N/A;   VEIN HARVEST Left 01/09/2018   Procedure:  LEFT SAPHENOUS VEIN HARVEST;  Surgeon: Colorado Penne Bruckner, MD;  Location: [facility] OR;  Service: Vascular;  Laterality: Left;    FAMILY HISTORY: family history includes Cancer in his mother; Heart attack in his mother; Heart disease in his brother and father; Stroke in his sister.  SOCIAL HISTORY:  reports that he has been smoking cigarettes. He has a 120 pack-year smoking history. He has never used smokeless tobacco. He reports that he does not currently use alcohol after a past usage of about 14.0 standard drinks of alcohol per week. He reports that he does not use drugs.  ALLERGIES: Patient has no known allergies.  MEDICATIONS:  No current facility-administered medications for this visit.   No current outpatient medications on file.   Facility-Administered Medications Ordered in Other Visits  Medication Dose Route Frequency Provider Last Rate Last Admin   aspirin  chewable tablet 81 mg  81 mg Per Tube Daily Elgergawy, Brayton RAMAN, MD   81 mg at 04/02/24 1040   atorvastatin  (LIPITOR ) tablet 40 mg  40 mg Per Tube q1800 Elgergawy, Brayton RAMAN, MD   40 mg at 04/02/24 1713   diclofenac Sodium (VOLTAREN) 1 % topical gel 2 g  2 g Topical QID Pahwani, Ravi, MD   2 g at 04/02/24 1042   docusate (COLACE) 50 MG/5ML liquid 100 mg  100 mg Per Tube BID Simaan, Elizabeth S, PA-C   100 mg at 04/02/24 1041   famotidine (PEPCID) tablet 20  mg  20 mg Per Tube BID Simaan, Elizabeth S, PA-C   20 mg at 04/02/24 1041   feeding supplement (OSMOLITE 1.2 CAL) liquid 1,000 mL  1,000 mL Per Tube Continuous Elgergawy, Brayton RAMAN, MD 55 mL/hr at 04/02/24 0246 1,000 mL at 04/02/24 0246   free water 100 mL  100 mL Per Tube Q6H Elgergawy, Brayton RAMAN, MD   100 mL at 04/02/24 1714   heparin  injection 5,000 Units  5,000 Units Subcutaneous Q8H Simaan, Elizabeth S, PA-C   5,000 Units at 04/02/24 0545   hydrALAZINE  (APRESOLINE ) injection 5 mg  5 mg Intravenous Q4H PRN Elgergawy, Dawood S, MD       HYDROmorphone  (DILAUDID ) injection 0.5-1  mg  0.5-1 mg Intravenous Q3H PRN Augustus Almarie RAMAN, PA-C   1 mg at 04/02/24 0846   insulin  aspart (novoLOG ) injection 0-9 Units  0-9 Units Subcutaneous Q4H Augustus Almarie RAMAN, PA-C   1 Units at 04/02/24 9176   levETIRAcetam (KEPPRA) undiluted injection 500 mg  500 mg Intravenous Q12H Augustus Almarie RAMAN, PA-C   500 mg at 04/02/24 1713   methocarbamol  (ROBAXIN ) injection 500 mg  500 mg Intravenous Q8H SimaanAlmarie RAMAN, PA-C   500 mg at 04/02/24 1441   metoprolol  tartrate (LOPRESSOR ) tablet 12.5 mg  12.5 mg Per Tube BID Elgergawy, Dawood S, MD   12.5 mg at 04/02/24 1037   multivitamin with minerals tablet 1 tablet  1 tablet Per Tube Daily Elgergawy, Dawood S, MD   1 tablet at 04/02/24 1041   Oral care mouth rinse  15 mL Mouth Rinse 4 times per day Augustus Almarie RAMAN, PA-C   15 mL at 04/02/24 1200   Oral care mouth rinse  15 mL Mouth Rinse PRN Augustus Almarie RAMAN, PA-C       oxyCODONE  (Oxy IR/ROXICODONE ) immediate release tablet 5 mg  5 mg Per Tube Q6H PRN Pahwani, Ravi, MD       polyethylene glycol (MIRALAX  / GLYCOLAX ) packet 17 g  17 g Per Tube Daily Simaan, Elizabeth S, PA-C   17 g at 04/02/24 1041   scopolamine (TRANSDERM-SCOP) 1 MG/3DAYS 1 mg  1 patch Transdermal Q72H Simaan, Elizabeth S, PA-C   1 mg at 03/31/24 2054   thiamine  (VITAMIN B1) tablet 100 mg  100 mg Per Tube Daily Elgergawy, Dawood S, MD   100 mg at 04/02/24 1040   traZODone (DESYREL) tablet 100 mg  100 mg Oral QHS PRN Augustus Almarie RAMAN, PA-C        REVIEW OF SYSTEMS:  Notable for that above.   PHYSICAL EXAM:  vitals were not taken for this visit.   General: Alert and oriented, in no acute distress HEENT: Head is normocephalic. Extraocular movements are intact. Oropharynx is notable for ***. Neck: Neck is notable for *** Heart: Regular in rate and rhythm with no murmurs, rubs, or gallops. Chest: Clear to auscultation bilaterally, with no rhonchi, wheezes, or rales. Abdomen: Soft, nontender, nondistended, with no  rigidity or guarding. Extremities: No cyanosis or edema. Lymphatics: see Neck Exam Skin: No concerning lesions. Musculoskeletal: symmetric strength and muscle tone throughout. Neurologic: Cranial nerves II through XII are grossly intact. No obvious focalities. Speech is fluent. Coordination is intact. Psychiatric: Judgment and insight are intact. Affect is appropriate.   ECOG = ***  0 - Asymptomatic (Fully active, able to carry on all predisease activities without restriction)  1 - Symptomatic but completely ambulatory (Restricted in physically strenuous activity but ambulatory and able to carry out work of  a light or sedentary nature. For example, light housework, office work)  2 - Symptomatic, <50% in bed during the day (Ambulatory and capable of all self care but unable to carry out any work activities. Up and about more than 50% of waking hours)  3 - Symptomatic, >50% in bed, but not bedbound (Capable of only limited self-care, confined to bed or chair 50% or more of waking hours)  4 - Bedbound (Completely disabled. Cannot carry on any self-care. Totally confined to bed or chair)  5 - Death   Raylene MM, Creech RH, Tormey DC, et al. [phone]). Toxicity and response criteria of the [facility]. Am. DOROTHA Bridges. Oncol. 5 (6): 649-55   LABORATORY DATA:  Lab Results  Component Value Date   WBC 11.5 (H) 04/01/2024   HGB 12.1 (L) 04/01/2024   HCT 37.4 (L) 04/01/2024   MCV 91.9 04/01/2024   PLT 568 (H) 04/01/2024   CMP     Component Value Date/Time   NA 137 04/01/2024 0248   K 4.3 04/01/2024 0248   CL 101 04/01/2024 0248   CO2 25 04/01/2024 0248   GLUCOSE 107 (H) 04/01/2024 0248   BUN 9 04/01/2024 0248   CREATININE 0.90 04/01/2024 0248   CREATININE 1.07 03/02/2024 0803   CREATININE 0.92 11/13/2013 1031   CALCIUM  9.0 04/01/2024 0248   PROT 8.0 03/02/2024 0803   ALBUMIN  4.4 03/02/2024 0803   AST <10 (L) 03/02/2024 0803   ALT 8 03/02/2024 0803   ALKPHOS 87  03/02/2024 0803   BILITOT 0.5 03/02/2024 0803   GFRNONAA >60 04/01/2024 0248   GFRNONAA >60 03/02/2024 0803   GFRNONAA >89 11/13/2013 1031   GFRAA >60 01/14/2018 0436   GFRAA >89 11/13/2013 1031      Lab Results  Component Value Date   TSH 1.352 08/01/2016     RADIOGRAPHY: CT ANGIO NECK W OR WO CONTRAST Result Date: 03/29/2024 EXAM: CTA Neck 03/29/2024 05:39:44 AM TECHNIQUE: CT of the neck was performed without and with the administration of 75 mL of iohexol (OMNIPAQUE) 350 MG/ML injection. Multiplanar 2D and/or 3D reformatted images are provided for review. Automated exposure control, iterative reconstruction, and/or weight based adjustment of the mA/kV was utilized to reduce the radiation dose to as low as reasonably achievable. Stenosis of the internal carotid arteries measured using NASCET criteria. COMPARISON: CT of the neck dated 02/27/2024 and PET/CT fusion study dated 03/09/2024. CLINICAL HISTORY: Pharyngeal cancer, oral bleeding need to evaluate for major vessel invasion by the tumor extravasation. FINDINGS: AORTIC ARCH AND ARCH VESSELS: No dissection or arterial injury. No significant stenosis of the brachiocephalic or subclavian arteries. CERVICAL CAROTID ARTERIES: No dissection, arterial injury, or hemodynamically significant stenosis by NASCET criteria. No encasement of the carotid arteries or proximal branches of the external carotid arteries. CERVICAL VERTEBRAL ARTERIES: No dissection, arterial injury, or significant stenosis. LUNGS AND MEDIASTINUM: Since the previous studies, a tracheostomy tube has been placed and its tip is in an appropriate position within the trachea. SOFT TISSUES: There is nodular thickening and enhancement again demonstrated within the posterior pharyngeal wall of the oral pharynx and hypopharynx bilaterally, more extensive on the left. There is no evidence of active bleeding. BONES: No acute abnormality. IMPRESSION: 1. Nodular thickening and enhancement within  the posterior pharyngeal wall of the oral and hypopharynx bilaterally, more extensive on the left, without evidence of active bleeding or major vessel invasion 2. Tracheostomy tube in appropriate position within the trachea Electronically signed by: Evalene Coho MD 03/29/2024  06:38 AM EDT RP Workstation: [id number]   CT ABDOMEN WO CONTRAST Result Date: 03/29/2024 CLINICAL DATA:  Assess G-tube placement. Head and neck cancer. * Tracking Code: BO * EXAM: CT ABDOMEN WITHOUT CONTRAST TECHNIQUE: Multidetector CT imaging of the abdomen was performed following the standard protocol without IV contrast. RADIATION DOSE REDUCTION: This exam was performed according to the departmental dose-optimization program which includes automated exposure control, adjustment of the mA and/or kV according to patient size and/or use of iterative reconstruction technique. COMPARISON:  PET-CT 03/09/2024 FINDINGS: Image quality degraded by beam hardening artifact emanating from retained barium contrast material in the colon . Lower chest: There is debris in the bronchus intermedius and left lower lobe airway. Central collapse/consolidative opacity is seen in the left upper lobe (image 9/series 6 with peripheral airway impaction seen in the posterior lower lobes bilaterally. Dependent atelectasis noted in both lung bases with trace bilateral pleural effusions. Hepatobiliary: No suspicious focal abnormality in the liver on this study without intravenous contrast. Gallbladder is distended. Tiny calcified gallstones evident. Porta hepatis, pancreatic head, and descending duodenum obscured by beam hardening artifact. Pancreas: Pancreas essentially obscured by beam hardening artifact from retained barium contrast material in the colon. Spleen: No splenomegaly. No suspicious focal mass lesion. Adrenals/Urinary Tract: No adrenal nodule or mass. Right kidney unremarkable. Tiny nonobstructing stone identified lower pole left kidney.  Stomach/Bowel: Stomach is nondistended in largely obscured by artifact. As above, descending duodenum is obscured. No small bowel dilatation within the visualized abdomen. Diffuse colonic distension noted with retained contrast material. Vascular/Lymphatic: There is moderate atherosclerotic calcification of the abdominal aorta without aneurysm. No retroperitoneal lymphadenopathy. Gastrohepatic and hepatoduodenal ligaments are largely obscured by beam hardening artifact. Other: No intraperitoneal free fluid. Musculoskeletal: No worrisome lytic or sclerotic osseous abnormality. Old posterior left rib fractures evident. Compression deformity at T11 and T12 is similar to chest CT of 02/20/2024. IMPRESSION: 1. Image quality degraded by beam hardening artifact emanating from retained barium contrast material in the colon. 2. Debris in the bronchus intermedius and left lower lobe airway with central collapse/consolidative opacity in the left upper lobe. Imaging features concerning for aspiration with associated infectious/inflammatory consolidative opacity in the central left lower lobe. 3. Stomach is nondistended and largely obscured by artifact. 4. Diffuse colonic distension with retained contrast material. 5. Cholelithiasis. 6. Tiny nonobstructing stone lower pole left kidney. 7.  Aortic Atherosclerosis (ICD10-I70.0). Electronically Signed   By: Camellia Candle M.D.   On: 03/29/2024 06:02   DG Chest Port 1 View Result Date: 03/27/2024 CLINICAL DATA:  Status post tracheostomy. EXAM: PORTABLE CHEST 1 VIEW COMPARISON:  PET CT 03/09/2024 FINDINGS: Tracheostomy tube tip projects over the trachea at the level of the clavicles. No evidence of pneumomediastinum or pneumothorax. The heart is normal in size. No pleural effusion or focal airspace disease. Multiple remote left rib fractures. Barium in the colon from prior esophagram. IMPRESSION: Tracheostomy tube tip projects over the trachea at the level of the clavicles. No  pneumomediastinum or pneumothorax. Electronically Signed   By: Andrea Gasman M.D.   On: 03/27/2024 17:30   DG ESOPHAGUS W SINGLE CM (SOL OR THIN BA) Result Date: 03/20/2024 CLINICAL DATA:  Provided history: Dysphagia to solids. Dysphagia, unspecified. New diagnosis of pharyngeal mass. EXAM: ESOPHOGRAM/BARIUM SWALLOW TECHNIQUE: A single contrast examination was performed using thin barium. FLUOROSCOPY: Radiation Exposure Index (as provided by the fluoroscopic device): 26.70 mGy Kerma COMPARISON:  PET CT 03/09/2014. FINDINGS: Posterior pharyngeal filling defect at site of the patient's known pharyngeal mass (for  instance as seen on series 1, image 162). Contrast traverses the pharynx and passes into the esophagus. However, there is some extension of contrast into the nasopharynx while swallowing and is likely due to partial pharyngeal obstruction. Somewhat prominent cricopharyngeus impression upon the hypopharynx/cervical esophagus. Esophagus is otherwise normal in Stephen and smooth in contour. Normal esophageal motility observed. No appreciable hiatal hernia. No gastroesophageal reflux observed. A barium tablet was not administered as the patient reported an inability to swallow tablets unless crushed. IMPRESSION: 1. Posterior pharyngeal filling defect at site of the patient's known pharyngeal mass. Contrast traverses the pharynx and passes into the esophagus. However, there is some extension of contrast into the nasopharynx while swallowing and this is likely due to partial pharyngeal obstruction. 2. Somewhat prominent cricopharyngeus impression upon the hypopharynx/cervical esophagus. 3. A barium tablet was not administered as the patient reported an inability to swallow tablets unless crushed. 4. Otherwise unremarkable single-contrast esophagram, as described. Electronically Signed   By: Rockey Childs D.O.   On: 03/20/2024 10:04   NM PET Image Initial (PI) Skull Base To Thigh Result Date:  03/09/2024 CLINICAL DATA:  Initial treatment strategy for posterior hypopharyngeal mass. EXAM: NUCLEAR MEDICINE PET SKULL BASE TO THIGH TECHNIQUE: 6.21 mCi F-18 FDG was injected intravenously. Full-ring PET imaging was performed from the skull base to thigh after the radiotracer. CT data was obtained and used for attenuation correction and anatomic localization. Fasting blood glucose: 97 mg/dl COMPARISON:  Neck CT 90/95/7974 FINDINGS: Mediastinal blood pool activity: SUV max 3.4 Liver activity: SUV max NA NECK: Large hypermetabolic mass occupying the entire posterior hypopharynx. SUV max is 39.2. No obvious involvement of the epiglottis. No hypermetabolic cervical adenopathy. Incidental CT findings: Significant age advanced bilateral carotid artery calcifications. Large remote occipital lobe infarct on the left. CHEST: No hypermetabolic mediastinal or hilar nodes. No suspicious pulmonary nodules on the CT scan. No supraclavicular or axillary adenopathy. Incidental CT findings: Age advanced atherosclerotic calcifications involving the aorta, branch vessels and coronary arteries. ABDOMEN/PELVIS: No abnormal hypermetabolic activity within the liver, pancreas, adrenal glands, or spleen. No hypermetabolic lymph nodes in the abdomen or pelvis. Incidental CT findings: Age advanced atherosclerotic calcifications involving the aorta and iliac arteries and branch vessels but no aneurysm. Small calcified gallstone in the gallbladder. Left renal calculus. Mild prostate gland enlargement. Surgical scarring changes in the left inguinal area. SKELETON: No findings suspicious for osseous metastatic disease. 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 Remote left-sided chest trauma with healed left clavicle left rib fractures. IMPRESSION: 1. 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 2. No hypermetabolic cervical adenopathy or evidence of  metastatic disease. 3. Age advanced vascular disease. 4. Aortic atherosclerosis. Aortic Atherosclerosis (ICD10-I70.0). Electronically Signed   By: MYRTIS Stammer M.D.   On: 03/09/2024 15:09      IMPRESSION/PLAN:  This is a delightful patient with head and neck cancer. I *** recommend radiotherapy for this patient.  We discussed the potential risks, benefits, and side effects of radiotherapy. We talked in detail about acute and late effects. We discussed that some of the most bothersome acute effects may be mucositis, dysgeusia, salivary changes, skin irritation, hair loss, dehydration, weight loss and fatigue. We talked about late effects which include but are not necessarily limited to dysphagia, hypothyroidism, nerve injury, vascular injury, spinal cord injury, xerostomia, trismus, neck edema, dental issues, non-healing wound, and potentially fatal injury to any of the tissues in the head and neck region. No guarantees  of treatment were given. A consent form was signed and placed in the patient's medical record. The patient is enthusiastic about proceeding with treatment. I look forward to participating in the patient's care.    Simulation (treatment planning) will take place ***  We also discussed that the treatment of head and neck cancer is a multidisciplinary process to maximize treatment outcomes and quality of life. For this reason the following referrals have been or will be made:  *** Medical oncology to discuss chemotherapy   *** Dentistry for dental evaluation, possible extractions in the radiation fields, and /or advice on reducing risk of cavities, osteoradionecrosis, or other oral issues.  *** Nutritionist for nutrition support during and after treatment.  *** Speech language pathology for swallowing and/or speech therapy.  *** Social work for social support.   *** Physical therapy due to risk of lymphedema in neck and deconditioning.  *** Baseline labs including TSH.  On  date of service, in total, I spent *** minutes on this encounter. Patient was seen in person.  __________________________________________   Lauraine Golden, MD  This document serves as a record of services personally performed by Lauraine Golden, MD. It was created on her behalf by Dorthy Fuse, a trained medical scribe. The creation of this record is based on the scribe's personal observations and the provider's statements to them. This document has been checked and approved by the attending provider.

## 2024-04-03 ENCOUNTER — Ambulatory Visit: Admitting: Radiation Oncology

## 2024-04-03 DIAGNOSIS — E43 Unspecified severe protein-calorie malnutrition: Secondary | ICD-10-CM | POA: Diagnosis not present

## 2024-04-03 DIAGNOSIS — Z930 Tracheostomy status: Secondary | ICD-10-CM | POA: Diagnosis not present

## 2024-04-03 DIAGNOSIS — C140 Malignant neoplasm of pharynx, unspecified: Secondary | ICD-10-CM

## 2024-04-03 LAB — EXPECTORATED SPUTUM ASSESSMENT W GRAM STAIN, RFLX TO RESP C

## 2024-04-03 LAB — GLUCOSE, CAPILLARY
Glucose-Capillary: 102 mg/dL — ABNORMAL HIGH (ref 70–99)
Glucose-Capillary: 108 mg/dL — ABNORMAL HIGH (ref 70–99)
Glucose-Capillary: 115 mg/dL — ABNORMAL HIGH (ref 70–99)
Glucose-Capillary: 124 mg/dL — ABNORMAL HIGH (ref 70–99)
Glucose-Capillary: 129 mg/dL — ABNORMAL HIGH (ref 70–99)
Glucose-Capillary: 130 mg/dL — ABNORMAL HIGH (ref 70–99)

## 2024-04-03 MED ORDER — LEVETIRACETAM 500 MG PO TABS
500.0000 mg | ORAL_TABLET | Freq: Two times a day (BID) | ORAL | Status: DC
  Administered 2024-04-04 – 2024-04-27 (×30): 500 mg
  Filled 2024-04-03 (×37): qty 1

## 2024-04-03 MED ORDER — INSULIN ASPART 100 UNIT/ML IJ SOLN
0.0000 [IU] | Freq: Four times a day (QID) | INTRAMUSCULAR | Status: DC
  Administered 2024-04-07 – 2024-04-20 (×10): 1 [IU] via SUBCUTANEOUS

## 2024-04-03 MED ORDER — ACETAMINOPHEN 325 MG PO TABS
650.0000 mg | ORAL_TABLET | Freq: Four times a day (QID) | ORAL | Status: DC | PRN
  Administered 2024-04-03: 650 mg
  Filled 2024-04-03: qty 2

## 2024-04-03 MED ORDER — METHOCARBAMOL 500 MG PO TABS
500.0000 mg | ORAL_TABLET | Freq: Three times a day (TID) | ORAL | Status: DC
Start: 2024-04-03 — End: 2024-04-28
  Administered 2024-04-03 – 2024-04-28 (×41): 500 mg
  Filled 2024-04-03 (×48): qty 1

## 2024-04-03 NOTE — Progress Notes (Signed)
 Pt refused all his 22:00 scheduled meds after he agreed to take them via PEG tube. Meds were all scanned as given and crushed then when I reach for the PEG tube port pt started to push my hands away and upset. NP Andrez was notified via secured chat. EMAR was then edited to show as not given (Refused).

## 2024-04-03 NOTE — Progress Notes (Signed)
 SLP Cancellation Note  Patient Details Name: Stephen Bowen MRN: [id number] DOB: [birth year]   Cancelled treatment:       Reason Eval/Treat Not Completed: Other (comment);Patient declined, no reason specified  [name] in response to working with SLP. Will continue efforts.     Madelin POUR, MS [facility] SLP Acute Rehab Services Office [phone]   Nicolas Emmie Caldron 04/03/2024, 10:23 AM

## 2024-04-03 NOTE — Progress Notes (Signed)
 Nutrition Follow-up  DOCUMENTATION CODES:   Severe malnutrition in context of chronic illness  INTERVENTION:  - Continue current tube feeding via PEG: Osmolite 1.2 at 55 ml/h (1320 ml per day) FWF 100 ml q6h (400 ml per day) Provides 1584 kcal, 73 gm protein, 1482 ml free water daily    - Monitor weight trends.    NUTRITION DIAGNOSIS:   Severe Malnutrition related to chronic illness as evidenced by severe fat depletion, severe muscle depletion, percent weight loss. *ongoing  GOAL:   Patient will meet greater than or equal to 90% of their needs *met wit TF  MONITOR:   PO intake, Diet advancement, Supplement acceptance, Skin, TF tolerance, Weight trends, Labs  REASON FOR ASSESSMENT:   Consult Enteral/tube feeding initiation and management  ASSESSMENT:   Pt with PMH significant for: HTN, PAD, bilateral AKAs, CAD, HLD, diabetes mellitus, MI, dementia, CKD II. Admitted for tracheostomy placement and biopsy of pharyngeal mass. Has been with prolonged difficulty swallowing and some hemoptysis.  10/03 - admitted; tracheostomy placed; laryngoscopy and biopsy 10/04 - transitioned to trach collar 10/05 - transferred out of ICU 10/06 - PEG placed 10/7 - TF initiated 10/9 - Transferred to [facility] for initiation of radiation therapy  Patient agitated at time of visit. Noted to have been agitated by several other providers today as well.  Shook his head no when asked if he was having any issues tolerating tube feeds. Confirmed with RN at bedside that patient tolerating tube feeds well.   Discussed with patient could trial bolus but patient immediately very agitated and confirmed he does not want to make any changes at this time.  Discussed with RN. Patient often refusing meds via tube but has been okay with continuous tube feeds. For now, will continue current regimen and reassess next week to determine if can increase/change TF to bolus.    Admit Weight: 94# Current Weight: 94# (as  of 10/6) I&O's: +1.0L since admit  Medications reviewed and include: Colace, Miralax , 100mg  thiamine  (x5 days, ends tomorrow), Q6H FWF  Labs reviewed:  No BMP since 10/8   Diet Order:   Diet Order             Diet NPO time specified  Diet effective now                   EDUCATION NEEDS:  Not appropriate for education at this time  Skin:  Skin Assessment: Skin Integrity Issues: Skin Integrity Issues:: DTI DTI: coccyx  Last BM:  10/8 - type 6  Height:  Ht Readings from Last 1 Encounters:  03/30/24 5' 6 (1.676 m)   Weight:  Wt Readings from Last 1 Encounters:  03/30/24 42.7 kg   Ideal Body Weight:  43.8 kg  BMI:  Body mass index is 15.19 kg/m.  Estimated Nutritional Needs:  Kcal:  1500-1700 kcals Protein:  70-85g Fluid:  1.5-1.7L/day    Trude Ned RD, LDN Contact via Secure Chat.

## 2024-04-03 NOTE — Progress Notes (Signed)
 PT Cancellation Note  Patient Details Name: Stephen Bowen MRN: [id number] DOB: 05-Jun-1956   Cancelled Treatment:    Reason Eval/Treat Not Completed: Patient declined, no reason specified (pt declined all mobility including bed level exercises. This is 3rd consecutive refusal. PT signing off due to lack of participation from pt. Please re-order if pt is willing to participate in PT.)  Sylvan Delon Copp PT 04/03/2024  Acute Rehabilitation Services  Office [phone]

## 2024-04-03 NOTE — TOC Progression Note (Signed)
 Transition of Care [facility]) - Progression Note    Patient Details  Name: LILIANA BRENTLINGER MRN: [id number] Date of Birth: March 26, 1956  [facility] CM/SW Contact  Tawni CHRISTELLA Eva, LCSW Phone Number: 04/03/2024, 9:22 AM  Clinical Narrative:     CSW spoke with Fairy from [facility] to inquire if the facility can accept the pt back. He reported that the pt is a LTC resident at the facility and that they are happy to take him back if they can meet his needs; however, they are not able to accommodate the pt's scheduled radiation treatments. Fairy stated he will double check with transportation and follow up with CSW. Care Management to follow.   Expected Discharge Plan: Skilled Nursing Facility Barriers to Discharge: Continued Medical Work up               Expected Discharge Plan and Services In-house Referral: Clinical Social Work     Living arrangements for the past 2 months: Skilled Nursing Facility                                       Social Drivers of Health (SDOH) Interventions SDOH Screenings   Food Insecurity: Patient Unable To Answer (03/27/2024)  Housing: Low Risk  (03/27/2024)  Transportation Needs: No Transportation Needs (03/27/2024)  Utilities: Not At Risk (03/27/2024)  Depression (PHQ2-9): Low Risk  (03/02/2024)  Social Connections: Patient Unable To Answer (03/27/2024)  Tobacco Use: High Risk (03/30/2024)    Readmission Risk Interventions     No data to display

## 2024-04-03 NOTE — Progress Notes (Signed)
 Patient was briefly visited today.  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  Patient was seen by Dr. Izell, radiation oncology earlier today to discuss radiation planning for his head and neck cancer.    I introduced myself.  Discussed the role of concurrent chemoradiation.  Patient is not interested in pursuing chemotherapy at this time and is considering to proceed with radiation alone.  If he changes his mind, I will see him again to discuss chemotherapy options, side effects and further plan of care.

## 2024-04-03 NOTE — Hospital Course (Addendum)
 68 year old man underwent elective biopsy of pharyngeal mass, then required tracheostomy, was subsequently admitted by critical care postoperatively, successfully taken off the ventilator and transferred to the hospitalist service.  Then transferred to [facility] for initiation of radiation therapy.  Consultants ENT Admitted by critical care Radiation oncology Oncology Palliative care  Procedures/Events 10/3 direct laryngoscopy and biopsy, tracheostomy 10/6 open Stamm gastrostomy tube 10/6 transferred to hospitalist service

## 2024-04-03 NOTE — Progress Notes (Signed)
  Progress Note   Patient: Stephen Bowen FMW:[id number] DOB: 02/15/1956 DOA: 03/27/2024     7 DOS: the patient was seen and examined on 04/03/2024   Brief hospital course: 68 year old man underwent elective biopsy of pharyngeal mass, then required tracheostomy, was subsequently admitted by critical care postoperatively, successfully taken off the ventilator and transferred to the hospitalist service.  Then transferred to [facility] for initiation of radiation therapy.  Consultants ENT Admitted by critical care Radiation oncology Oncology Palliative care  Procedures/Events 10/3 direct laryngoscopy and biopsy, tracheostomy 10/6 open Stamm gastrostomy tube 10/6 transferred to hospitalist service  Assessment and Plan: Squamous Cell Carcinoma of the pharynx  Status post tracheostomy Hemoptysis Continue with cuffed trach, deflated, do not exchange in case have bleed and will need cuff inflated for airway protection .  7 weeks of radiation therapy planned.  Per report skilled nursing facility could not provide transport needs.  Failure to thrive  Solid food dysphagia Severe malnutrition in the context of chronic illness Status post PEG tube.  Continue PEG tube feeds.  PAD CAD PMH CVA Status post bilateral BKA  Diabetes mellitus type 2 Hemoglobin A1c 5.9 Sliding scale insulin   Seizure disorder Dementia Continue Keppra  Disposition Admitted from [facility] under long-term care  Hemoptysis resolved    Subjective:  I'm tired  Physical Exam: Vitals:   04/03/24 0835 04/03/24 1106 04/03/24 1407 04/03/24 1623  BP:   (!) 143/82   Pulse: (!) 105 90 97 94  Resp:   18 16  Temp:   98.1 F (36.7 C)   TempSrc:   Oral   SpO2: 96% 97% 95% 95%  Weight:      Height:       Physical Exam Vitals reviewed.  Constitutional:      General: He is not in acute distress.    Appearance: He is ill-appearing. He is not toxic-appearing.  Cardiovascular:     Rate and  Rhythm: Normal rate and regular rhythm.     Heart sounds: No murmur heard. Pulmonary:     Effort: Pulmonary effort is normal. No respiratory distress.     Breath sounds: No wheezing, rhonchi or rales.  Neurological:     Mental Status: He is alert.     Data Reviewed: CBG noted  Family Communication: none  Disposition: Status is: Inpatient Remains inpatient appropriate because: inpatient radiation     Time spent: 35 minutes  Author: Toribio Door, MD 04/03/2024 6:39 PM  For on call review [email or web address].

## 2024-04-03 NOTE — Progress Notes (Signed)
 Pt refused 2200 vital signs. Reported pain 10/10. Made another attempt after administering pain meds; however, patient continued to decline. Patient also declined Colace, some repositioning, and declined some scheduled oral care. Educated provided on importance of compliance; patient continued to decline. Will re-attempt and continue monitoring.

## 2024-04-04 DIAGNOSIS — Z66 Do not resuscitate: Secondary | ICD-10-CM | POA: Diagnosis not present

## 2024-04-04 DIAGNOSIS — Z930 Tracheostomy status: Secondary | ICD-10-CM | POA: Diagnosis not present

## 2024-04-04 DIAGNOSIS — Z515 Encounter for palliative care: Secondary | ICD-10-CM

## 2024-04-04 DIAGNOSIS — Z7189 Other specified counseling: Secondary | ICD-10-CM

## 2024-04-04 DIAGNOSIS — C140 Malignant neoplasm of pharynx, unspecified: Secondary | ICD-10-CM | POA: Diagnosis not present

## 2024-04-04 DIAGNOSIS — E43 Unspecified severe protein-calorie malnutrition: Secondary | ICD-10-CM | POA: Diagnosis not present

## 2024-04-04 LAB — GLUCOSE, CAPILLARY
Glucose-Capillary: 128 mg/dL — ABNORMAL HIGH (ref 70–99)
Glucose-Capillary: 138 mg/dL — ABNORMAL HIGH (ref 70–99)
Glucose-Capillary: 124 mg/dL — ABNORMAL HIGH (ref 70–99)

## 2024-04-04 MED ORDER — METOPROLOL TARTRATE 25 MG PO TABS
25.0000 mg | ORAL_TABLET | Freq: Two times a day (BID) | ORAL | Status: DC
  Administered 2024-04-04 – 2024-04-06 (×4): 25 mg
  Filled 2024-04-04 (×4): qty 1

## 2024-04-04 MED ORDER — TRAZODONE HCL 100 MG PO TABS
100.0000 mg | ORAL_TABLET | Freq: Every evening | ORAL | Status: AC | PRN
  Administered 2024-04-13 – 2024-04-14 (×2): 100 mg
  Filled 2024-04-04 (×2): qty 1

## 2024-04-04 MED ORDER — GUAIFENESIN-DM 100-10 MG/5ML PO SYRP
5.0000 mL | ORAL_SOLUTION | ORAL | Status: DC | PRN
  Administered 2024-04-04 – 2024-04-19 (×5): 5 mL
  Filled 2024-04-04 (×6): qty 10

## 2024-04-04 MED ORDER — LACTATED RINGERS IV BOLUS
500.0000 mL | Freq: Once | INTRAVENOUS | Status: AC
  Administered 2024-04-04: 500 mL via INTRAVENOUS

## 2024-04-04 NOTE — Progress Notes (Signed)
  Progress Note   Patient: Stephen Bowen FMW:[id number] DOB: 11/20/1955 DOA: 03/27/2024     8 DOS: the patient was seen and examined on 04/04/2024   Brief hospital course: 68 year old man underwent elective biopsy of pharyngeal mass, then required tracheostomy, was subsequently admitted by critical care postoperatively, successfully taken off the ventilator and transferred to the hospitalist service.  Then transferred to [facility] for initiation of radiation therapy.  Consultants ENT Admitted by critical care Radiation oncology Oncology Palliative care  Procedures/Events 10/3 direct laryngoscopy and biopsy, tracheostomy 10/6 open Stamm gastrostomy tube 10/6 transferred to hospitalist service   Assessment and Plan: Squamous Cell Carcinoma of the pharynx  Status post tracheostomy Hemoptysis Continue with cuffed trach, deflated, do not exchange in case have bleed and will need cuff inflated for airway protection .  7 weeks of radiation therapy planned.  Per report skilled nursing facility could not provide transport needs.  Will investigate further on Monday.   Failure to thrive  Solid food dysphagia Severe malnutrition in the context of chronic illness Status post PEG tube.  Continue PEG tube feeds.   PAD CAD PMH CVA Status post bilateral BKA   Diabetes mellitus type 2 Hemoglobin A1c 5.9 Continue sliding scale insulin .  CBG stable.   Seizure disorder Dementia Continue Keppra   Disposition Admitted from [facility] under long-term care   Hemoptysis resolved       Subjective:  Does not seem to want to answer questions, but seems to feel ok  Physical Exam: Vitals:   04/03/24 2045 04/03/24 2300 04/04/24 0408 04/04/24 0559  BP:    122/85  Pulse:    100  Resp:      Temp:    98.5 F (36.9 C)  TempSrc:    Oral  SpO2: 97% 95% 95% 96%  Weight:      Height:       Physical Exam Vitals reviewed.  Constitutional:      General: He is not in acute  distress.    Appearance: He is not ill-appearing or toxic-appearing.  Cardiovascular:     Rate and Rhythm: Normal rate and regular rhythm.     Heart sounds: No murmur heard. Pulmonary:     Effort: Pulmonary effort is normal. No respiratory distress.     Breath sounds: No wheezing, rhonchi or rales.  Neurological:     Mental Status: He is alert.     Data Reviewed: CBG stable  Family Communication:   Disposition: Status is: Inpatient Remains inpatient appropriate because: needs radiation therapy     Time spent: 35 minutes  Author: Toribio Door, MD 04/04/2024 4:40 PM  For on call review [email or web address].

## 2024-04-04 NOTE — Progress Notes (Signed)
   04/04/24 2046  Assess: MEWS Score  Temp 97.7 F (36.5 C)  BP 122/80  MAP (mmHg) 90  Pulse Rate (!) 137  Resp 20  SpO2 100 %  O2 Device Tracheostomy Collar  Assess: MEWS Score  MEWS Temp 0  MEWS Systolic 0  MEWS Pulse 3  MEWS RR 0  MEWS LOC 0  MEWS Score 3  MEWS Score Color Yellow  Assess: if the MEWS score is Yellow or Red  Were vital signs accurate and taken at a resting state? Yes  Does the patient meet 2 or more of the SIRS criteria? No  MEWS guidelines implemented  Yes, yellow  Treat  MEWS Interventions Considered administering scheduled or prn medications/treatments as ordered  Take Vital Signs  Increase Vital Sign Frequency  Yellow: Q2hr x1, continue Q4hrs until patient remains green for 12hrs  Escalate  MEWS: Escalate Yellow: Discuss with charge nurse and consider notifying provider and/or RRT  Notify: Charge Nurse/RN  Name of Charge Nurse/RN Notified Zane Serge  Provider Notification  Provider Name/Title Erminio Cone  Date Provider Notified 04/04/24  Time Provider Notified 2307  Method of Notification Page  Notification Reason Change in status  Provider response See new orders  Date of Provider Response 04/04/24  Time of Provider Response 2308  Assess: SIRS CRITERIA  SIRS Temperature  0  SIRS Respirations  0  SIRS Pulse 1  SIRS WBC 0  SIRS Score Sum  1

## 2024-04-04 NOTE — Consult Note (Signed)
 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  Oncology and radiation oncology consulted for recommendations and evaluation.  Palliative medicine team consulted to assist with complex medical decision making.  Extensive review of EMR including recent documentation from medical oncology, radiation oncology, physical therapy, TOC, SLP, and nutrition.  Patient has been refusing multiple medical interventions such as SLP and PT.  Reviewed EMR and noted patient has been refusing multiple doses of scheduled medications during hospitalization.  Patient had discussed care with radiation oncologist and his sister yesterday and patient had agreed to proceed with radiation with hope that it may cure his cancer and also improve his quality of life by giving him the best chance of having his trach removed.  Patient has adamantly refused receiving chemotherapy.  Personally reviewed recent imaging including CT angio neck  and PET scan showing thickening/cancer in posterior pharyngeal wall. Discussed care with hospitalist for medical updates.  Presented to bedside to meet with patient.  No visitors present at bedside.  Patient laying in bed with trach collar in place.  Patient awake and can interact by mouthing words, using his hands for thumbs up and thumbs down, and shaking his head yes and no.  Offered patient ability to write answers and he refused.  Tried to explore current medical illness with patient.  Noted patient currently has agreed to receive radiation for 7 weeks.  Was discussed that hope of this is for cure and better quality of life.  Patient very frustrated during this interaction and kept mouthing he was done and did not want this.  Tried to explore this further though patient did not want to interact much about it.  Tried to explain that if patient does not want to undergo aggressive medical interventions, would then be focusing on his comfort at end-of-life with considering hospice support at his long-term care facility.  Patient mild he did not know what he wanted.  Acknowledges this.  Encouraged further conversations with family about the type of care he is willing to accept moving forward.  Also discussed that patient would need to work with medical team to accept the care that has been offered and he continues to refuse while here if his hope is for more time while proceeding with radiation.  Encouraged patient to consider this.  Did inquire if patient has family that best supports him.  Patient noted his sister does.  Patient able to agree that he would want his sister to make medical decisions for him if he was unable to make medical decisions for himself.  With permission,  tried to discuss CODE STATUS with patient.  Explained full code versus DNR/DNI.  Patient continued to shake his head no and now that he would not want CPR or life support on machines.  Acknowledged this and noted would further discuss  with his sister.  Patient shaking his head agreeing with this.  Tried to provide emotional support reactive listening.  Noted palliative medicine team to continue to follow along with patient's medical journey.  Called patient's sister, Stephen Bowen, after visit with patient.  Introduced myself as a member of the palliative medicine team around patient's medical journey.  Able to inquire about HCPOA status.  Stephen Bowen noted that her brother, Stephen Bowen, had previously been patient's HCPOA though because of his own health problems, she is now patient's HCPOA and POA.  Acknowledged this.   Stephen Bowen noted she has been updated about patient's care plan and had been on speaker phone during conversation with radiation oncologist.  She is supporting her brother receiving radiation for management.  Provided updates that patient has been refusing multiple medical interventions and noted importance of receiving these interventions if he wants to continue with the radiation and aggressive medical interventions.  Sister noting that her brother can become very frustrated and stubborn about things.  Encouraged further conversations between the 2 of them about what patient really wants moving forward and how much he is willing to receive regarding medical care.  Stephen Bowen noted she would plan to speak with her brother. Also able to discuss patient's CODE STATUS.  Spent time explaining full code versus DNR/DNI.  Explained patient's interactions in the room.  Stephen Bowen agreed that patient would not want cardiac resuscitation or to be on machines for life support.  She is supporting change of CODE STATUS to DNR/DNI.  Noted would appropriately change in EMR.  All questions answered at that time.  Noted palliative medicine team to continue to follow with patient's medical journey.  Updated hospitalist and RN regarding discussion and visit.  Primary Diagnoses  Present on Admission:  Hypopharyngeal mass  Squamous cell carcinoma of pharynx  (HCC)   Past Medical History:  Diagnosis Date   CKD (chronic kidney disease), stage II    Coronary artery disease    Dementia (HCC)    Diabetes mellitus without complication (HCC)    Encephalopathy acute 07/2016   HTN (hypertension)    Hyperlipemia    Myocardial infarction (HCC)    Non-healing surgical wound    left AKA   Noncompliance    PAD (peripheral artery disease)    a.  s/p left femoral to PT artery bypass with propaten on 02/16/16.   S/P AKA (above knee amputation) bilateral (HCC)    Tobacco abuse    Social History   Socioeconomic History   Marital status: Single    Spouse name: Not on file   Number of children: Not on file   Years of education: Not on file   Highest education level: Not on file  Occupational History   Occupation: Unemployed  Tobacco Use   Smoking status: Every Day    Current packs/day: 3.00    Average packs/day: 3.0 packs/day for 40.0 years (120.0 ttl pk-yrs)    Types: Cigarettes   Smokeless tobacco: Never   Tobacco comments:    cutting back amount he is buying  Vaping Use   Vaping status: Never Used  Substance and Sexual Activity   Alcohol use: Not Currently    Alcohol/week: 14.0 standard drinks of alcohol    Types: 14  Cans of beer per week    Comment: Drinks a 40 oz tallboy daily   [id number]  I quitdrinking   Drug use: No   Sexual activity: Yes    Birth control/protection: Condom  Other Topics Concern   Not on file  Social History Narrative   Smoking cigarettes since 68 y.o   Drinks beer 24 oz every other day    Social Drivers of Health   Financial Resource Strain: Not on file  Food Insecurity: Patient Unable To Answer (03/27/2024)   Hunger Vital Sign    Worried About Running Out of Food in the Last Year: Patient unable to answer    Ran Out of Food in the Last Year: Patient unable to answer  Transportation Needs: No Transportation Needs (03/27/2024)   PRAPARE - Administrator, Civil Service (Medical): No    Lack of  Transportation (Non-Medical): No  Physical Activity: Not on file  Stress: Not on file  Social Connections: Patient Unable To Answer (03/27/2024)   Social Connection and Isolation Panel    Frequency of Communication with Friends and Family: Patient unable to answer    Frequency of Social Gatherings with Friends and Family: Patient unable to answer    Attends Religious Services: Patient unable to answer    Active Member of Clubs or Organizations: Patient unable to answer    Attends Banker Meetings: Patient unable to answer    Marital Status: Patient unable to answer   Family History  Problem Relation Age of Onset   Cancer Mother        deceased in 32s    Heart attack Mother        diseased   Heart disease Father    Stroke Sister        age 21s   Heart disease Brother        pacemaker in his 34s   Scheduled Meds:  aspirin   81 mg Per Tube Daily   atorvastatin   40 mg Per Tube q1800   diclofenac Sodium  2 g Topical QID   docusate  100 mg Per Tube BID   famotidine  20 mg Per Tube BID   free water  100 mL Per Tube Q6H   heparin  injection (subcutaneous)  5,000 Units Subcutaneous Q8H   insulin  aspart  0-9 Units Subcutaneous Q6H   levETIRAcetam  500 mg Per Tube BID   methocarbamol   500 mg Per Tube Q8H   metoprolol  tartrate  12.5 mg Per Tube BID   mouth rinse  15 mL Mouth Rinse 4 times per day   polyethylene glycol  17 g Per Tube Daily   scopolamine  1 patch Transdermal Q72H   thiamine   100 mg Per Tube Daily   Continuous Infusions:  feeding supplement (OSMOLITE 1.2 CAL) 55 mL/hr at 04/03/24 0322   PRN Meds:.acetaminophen , hydrALAZINE , HYDROmorphone  (DILAUDID ) injection, mouth rinse, oxyCODONE , traZODone No Known Allergies CBC:    Component Value Date/Time   WBC 11.5 (H) 04/01/2024 0248   HGB 12.1 (L) 04/01/2024 0248   HGB 14.6 03/02/2024 0803   HCT 37.4 (L) 04/01/2024 0248   HCT 22.1 (L) 11/10/2017 0443   PLT 568 (H) 04/01/2024 0248   PLT 438 (H) 03/02/2024  0803   MCV 91.9 04/01/2024 0248   NEUTROABS 6.0 03/02/2024 0803   LYMPHSABS 1.4 03/02/2024 0803   MONOABS 0.6 03/02/2024 0803   EOSABS 0.9 (H) 03/02/2024 0803   BASOSABS 0.0 03/02/2024 0803   Comprehensive Metabolic Panel:  Component Value Date/Time   NA 137 04/01/2024 0248   K 4.3 04/01/2024 0248   CL 101 04/01/2024 0248   CO2 25 04/01/2024 0248   BUN 9 04/01/2024 0248   CREATININE 0.90 04/01/2024 0248   CREATININE 1.07 03/02/2024 0803   CREATININE 0.92 11/13/2013 1031   GLUCOSE 107 (H) 04/01/2024 0248   CALCIUM  9.0 04/01/2024 0248   AST <10 (L) 03/02/2024 0803   ALT 8 03/02/2024 0803   ALKPHOS 87 03/02/2024 0803   BILITOT 0.5 03/02/2024 0803   PROT 8.0 03/02/2024 0803   ALBUMIN  4.4 03/02/2024 0803    Physical Exam: Vital Signs: BP 122/85 (BP Location: Right Arm)   Pulse 100   Temp 98.5 F (36.9 C) (Oral)   Resp 18   Ht 5' 6 (1.676 m)   Wt 42.7 kg   SpO2 96%   BMI 15.19 kg/m  SpO2: SpO2: 96 % O2 Device: O2 Device: Tracheostomy Collar O2 Flow Rate: O2 Flow Rate (L/min): 6 L/min Intake/output summary:  Intake/Output Summary (Last 24 hours) at 04/04/2024 0830 Last data filed at 04/04/2024 0502 Gross per 24 hour  Intake 400 ml  Output 1300 ml  Net -900 ml   LBM: Last BM Date : 04/01/24 Baseline Weight: Weight: 51.1 kg Most recent weight: Weight: 42.7 kg  General: NAD, cachectic, frail, chronically ill-appearing Cardiovascular: RRR Respiratory: no increased work of breathing noted, not in respiratory distress, tracheostomy in place with trach collar on 6 L/min FiO2 28% Extremities: Bilateral BKAs Neuro: Awake, mouths words/shakes head/uses yes or no with hands to communicate, did not want to write to communicate Psych: Frustrated though appropriately answering questioning         Palliative Performance Scale: 30%              Additional Data Reviewed: No results for input(s): WBC, HGB, PLT, NA, BUN, CREATININE in the last 72  hours.  Invalid input(s): ALB  Imaging: CT ANGIO NECK W OR WO CONTRAST EXAM: CTA Neck 03/29/2024 05:39:44 AM  TECHNIQUE: CT of the neck was performed without and with the administration of 75 mL of iohexol (OMNIPAQUE) 350 MG/ML injection. Multiplanar 2D and/or 3D reformatted images are provided for review. Automated exposure control, iterative reconstruction, and/or weight based adjustment of the mA/kV was utilized to reduce the radiation dose to as low as reasonably achievable. Stenosis of the internal carotid arteries measured using NASCET criteria.  COMPARISON: CT of the neck dated 02/27/2024 and PET/CT fusion study dated 03/09/2024.  CLINICAL HISTORY: Pharyngeal cancer, oral bleeding need to evaluate for major vessel invasion by the tumor extravasation.  FINDINGS:  AORTIC ARCH AND ARCH VESSELS: No dissection or arterial injury. No significant stenosis of the brachiocephalic or subclavian arteries.  CERVICAL CAROTID ARTERIES: No dissection, arterial injury, or hemodynamically significant stenosis by NASCET criteria. No encasement of the carotid arteries or proximal branches of the external carotid arteries.  CERVICAL VERTEBRAL ARTERIES: No dissection, arterial injury, or significant stenosis.  LUNGS AND MEDIASTINUM: Since the previous studies, a tracheostomy tube has been placed and its tip is in an appropriate position within the trachea.  SOFT TISSUES: There is nodular thickening and enhancement again demonstrated within the posterior pharyngeal wall of the oral pharynx and hypopharynx bilaterally, more extensive on the left. There is no evidence of active bleeding.  BONES: No acute abnormality.  IMPRESSION: 1. Nodular thickening and enhancement within the posterior pharyngeal wall of the oral and hypopharynx bilaterally, more extensive on the left, without evidence of active bleeding or  major vessel invasion 2. Tracheostomy tube in appropriate position  within the trachea  Electronically signed by: Evalene Coho MD 03/29/2024 06:38 AM EDT RP Workstation: [id number] CT ABDOMEN WO CONTRAST CLINICAL DATA:  Assess G-tube placement. Head and neck cancer. * Tracking Code: BO *  EXAM: CT ABDOMEN WITHOUT CONTRAST  TECHNIQUE: Multidetector CT imaging of the abdomen was performed following the standard protocol without IV contrast.  RADIATION DOSE REDUCTION: This exam was performed according to the departmental dose-optimization program which includes automated exposure control, adjustment of the mA and/or kV according to patient size and/or use of iterative reconstruction technique.  COMPARISON:  PET-CT 03/09/2024  FINDINGS: Image quality degraded by beam hardening artifact emanating from retained barium contrast material in the colon .  Lower chest: There is debris in the bronchus intermedius and left lower lobe airway. Central collapse/consolidative opacity is seen in the left upper lobe (image 9/series 6 with peripheral airway impaction seen in the posterior lower lobes bilaterally. Dependent atelectasis noted in both lung bases with trace bilateral pleural effusions.  Hepatobiliary: No suspicious focal abnormality in the liver on this study without intravenous contrast. Gallbladder is distended. Tiny calcified gallstones evident. Porta hepatis, pancreatic head, and descending duodenum obscured by beam hardening artifact.  Pancreas: Pancreas essentially obscured by beam hardening artifact from retained barium contrast material in the colon.  Spleen: No splenomegaly. No suspicious focal mass lesion.  Adrenals/Urinary Tract: No adrenal nodule or mass. Right kidney unremarkable. Tiny nonobstructing stone identified lower pole left kidney.  Stomach/Bowel: Stomach is nondistended in largely obscured by artifact. As above, descending duodenum is obscured. No small bowel dilatation within the visualized abdomen. Diffuse  colonic distension noted with retained contrast material.  Vascular/Lymphatic: There is moderate atherosclerotic calcification of the abdominal aorta without aneurysm. No retroperitoneal lymphadenopathy. Gastrohepatic and hepatoduodenal ligaments are largely obscured by beam hardening artifact.  Other: No intraperitoneal free fluid.  Musculoskeletal: No worrisome lytic or sclerotic osseous abnormality. Old posterior left rib fractures evident. Compression deformity at T11 and T12 is similar to chest CT of 02/20/2024.  IMPRESSION: 1. Image quality degraded by beam hardening artifact emanating from retained barium contrast material in the colon. 2. Debris in the bronchus intermedius and left lower lobe airway with central collapse/consolidative opacity in the left upper lobe. Imaging features concerning for aspiration with associated infectious/inflammatory consolidative opacity in the central left lower lobe. 3. Stomach is nondistended and largely obscured by artifact. 4. Diffuse colonic distension with retained contrast material. 5. Cholelithiasis. 6. Tiny nonobstructing stone lower pole left kidney. 7.  Aortic Atherosclerosis (ICD10-I70.0).  Electronically Signed   By: Camellia Candle M.D.   On: 03/29/2024 06:02    I personally reviewed recent imaging.   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  Oncology and radiation oncology consulted  for recommendations and evaluation.  Palliative medicine team consulted to assist with complex medical decision making.  # Complex medical decision making/goals of care  - Discussed care with patient as detailed above in HPI.  Also discussed care with patient's sister over the phone as detailed above in HPI.  Patient agreed to radiation yesterday which would consist of 7 weeks of radiation.  Patient has refused chemotherapy.  Currently patient refusing appropriate medical interventions and appears frustrated.  Discussed pathways for medical care moving forward with patient including continuing with current aggressive medical management versus transitioning to comfort focused care and involving hospice support.  Patient was unsure of which path he wanted.  Discussed with sister who plans to further talk with her brother medical care.  Noted palliative medicine team will continue to follow along with patient's medical to engage in conversation as able and appropriate.  -  Code Status: Limited: Do not attempt resuscitation (DNR) -DNR-LIMITED -Do Not Intubate/DNI     - Discussed CODE STATUS with patient and sister as detailed above in HPI.  Explained full code versus DNR/DNI.  Patient noting he would not want cardiac resuscitation or to be kept alive on machines.  Sister agreed with change of CODE STATUS to DNR/DNI knowing her brother and what kind of medical care would be appropriate to him.  # Psycho-social/Spiritual Support:  - Support System: Sister-Stephen Bowen noted she is patient's Hotel manager  # Discharge Planning:  To Be Determined  Thank you for allowing the palliative care team to participate in the care Stephen Bowen.  Tinnie Radar, DO Palliative Care Provider PMT # [phone]  If patient remains symptomatic despite maximum doses, please call PMT at [phone] between 0700 and 1900. Outside of these hours, please call attending, as PMT does not have night coverage.  Billing based on MDM: High  Problems Addressed: One or more chronic illnesses with severe exacerbation, progression, or side effects of treatment.  Risks: Decision not to resuscitate or to de-escalate care because of poor prognosis

## 2024-04-04 NOTE — Progress Notes (Signed)
 Spoke to RRT Delon and she was not aware the oxygen collar has been taken off.

## 2024-04-04 NOTE — Progress Notes (Signed)
  Patient very irritable,refusing to be turned on schedule,refusing to have mouth suctioned,refusing to take meds.I encouraged patient to turn and prevent worsening of sacral pressure injury and also encouraged to take medicines but still refused.MD and charge nurse made aware.

## 2024-04-04 NOTE — Progress Notes (Signed)
 Correction from MEWS note, there were no new orders by NP Erminio Cone.

## 2024-04-04 NOTE — Progress Notes (Signed)
 Entered the room for med pass and found the pt without his oxygen collar while the oxygen collar was hanging on the wall. Notified charge nurse Philippe about the findings and she contacted RRT. RRT stated they removed it from the pt due to patient refusing to wear the oxygen collar. Also pt is having heavy secretions form his trach and changed from a clear color yesterday night to a brown color. Charge nurse and NP Jesus notified about the change in secretions color. Will keep monitoring.

## 2024-04-05 DIAGNOSIS — R627 Adult failure to thrive: Secondary | ICD-10-CM | POA: Insufficient documentation

## 2024-04-05 DIAGNOSIS — Z930 Tracheostomy status: Secondary | ICD-10-CM | POA: Diagnosis not present

## 2024-04-05 DIAGNOSIS — C140 Malignant neoplasm of pharynx, unspecified: Secondary | ICD-10-CM | POA: Diagnosis not present

## 2024-04-05 DIAGNOSIS — Z931 Gastrostomy status: Secondary | ICD-10-CM

## 2024-04-05 DIAGNOSIS — E119 Type 2 diabetes mellitus without complications: Secondary | ICD-10-CM

## 2024-04-05 DIAGNOSIS — E43 Unspecified severe protein-calorie malnutrition: Secondary | ICD-10-CM | POA: Diagnosis not present

## 2024-04-05 DIAGNOSIS — Z515 Encounter for palliative care: Secondary | ICD-10-CM | POA: Diagnosis not present

## 2024-04-05 DIAGNOSIS — J392 Other diseases of pharynx: Secondary | ICD-10-CM | POA: Diagnosis not present

## 2024-04-05 LAB — GLUCOSE, CAPILLARY
Glucose-Capillary: 118 mg/dL — ABNORMAL HIGH (ref 70–99)
Glucose-Capillary: 124 mg/dL — ABNORMAL HIGH (ref 70–99)
Glucose-Capillary: 170 mg/dL — ABNORMAL HIGH (ref 70–99)
Glucose-Capillary: 97 mg/dL (ref 70–99)

## 2024-04-05 MED ORDER — SCOPOLAMINE 1 MG/3DAYS TD PT72
1.0000 | MEDICATED_PATCH | TRANSDERMAL | Status: DC
Start: 2024-04-05 — End: 2024-04-28
  Administered 2024-04-05 – 2024-04-22 (×5): 1 mg via TRANSDERMAL
  Filled 2024-04-05 (×9): qty 1

## 2024-04-05 NOTE — Progress Notes (Signed)
 Daily Progress Note   Patient Name: Stephen Bowen       Date: 04/05/2024 DOB: 03-13-1956  Age: 68 y.o. MRN#: [id number] Attending Physician: Jadine Toribio SQUIBB, MD Primary Care Physician: Trinidad Glisson, MD Admit Date: 03/27/2024 Length of Stay: 9 days  Reason for Consultation/Follow-up: Establishing goals of care  Subjective:   CC: Patient noting that he does not want to be bothered.  Following up regarding complex medical decision making.  Subjective: Reviewed EMR including recent documentation from hospitalist and RN.  Patient has still been refusing care at times.  Patient had been refusing to wear RRT collar as well. Discussed care with bedside RN for updates.  Patient has taken some medications this morning for RN.  Patient currently having EKG completed due to tachycardia, in setting of him refusing multiple medications.  When presenting to bedside to see patient.  Patient appears visibly frustrated.  Tried to discuss with patient pathways for medical care moving forward.  Patient noting that he did not want to be bothered and left alone.  Did try to inquire if patient wanted to continue with radiation and he shook his head no.  Also inquired if patient wanted to focus on his comfort instead and patient shook his head no.  Patient mouthed I do not know.  Encouraged patient to further consider his pathways moving forward.  If patient continues to refuse interventions, including radiation, would recommend focusing on comfort.  Discussed care with hospitalist after visit.  Palliative medicine team continuing to follow along with patient's medical journey.  Objective:   Vital Signs:  BP 98/72 (BP Location: Left Arm)   Pulse (!) 129   Temp 98.5 F (36.9 C) (Oral)   Resp 20   Ht 5' 6 (1.676 m)   Wt 42.7 kg   SpO2 97%   BMI 15.19 kg/m   Physical Exam: General: NAD, cachectic, frail, chronically ill-appearing Cardiovascular: Tachycardia noted Respiratory: no increased  work of breathing noted, not in respiratory distress, tracheostomy in place with trach collar on 5L/min FiO2 28% Extremities: Bilateral BKAs Neuro: Awake, mouths words/shakes head/uses yes or no with hands to communicate, did not want to write to communicate Psych: Frustrated   Assessment & Plan:   Assessment: Patient is a 68 year old male with a past medical history of 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  Oncology and radiation oncology consulted for recommendations and evaluation.  Palliative medicine team consulted to assist with complex medical decision making.  Recommendations/Plan: # Complex medical decision making/goals of care:   - Attempted to discuss care with patient as detailed above in HPI.  Patient appears visibly frustrated with situation and though has been refusing care, mouths I do not know about stopping radiation to transition to comfort focused care.  Currently patient is planned to receive 7 weeks of radiation.  Initially nursing facility had noted would not be able to transport back and forth so patient would remain hospitalized for  this.  Hospitalist and TOC going to follow-up on this.  Discussed that if patient continues to refuse interventions such as radiation when offered, would be considered to focus on comfort and get hospice  involved moving forward.  Had informed sister, Wendy/reported HCPOA, about this during discussion on 04/04/2024.  Palliative medicine team will continue to engage in conversations as able and appropriate moving forward                -  Code Status: Limited: Do not attempt resuscitation (DNR) -DNR-LIMITED -Do Not Intubate/DNI     # Psycho-social/Spiritual Support:  - Support System: Leveda Hasten noted she is patient's HCPOA and POA   # Discharge Planning:  To Be Determined  Discussed with: Patient, RN, hospitalist  Thank you for allowing the palliative care team to participate in the care [name].  Tinnie Radar, DO Palliative Care Provider PMT # [phone]  If patient remains symptomatic despite maximum doses, please call PMT at [phone] between 0700 and 1900. Outside of these hours, please call attending, as PMT does not have night coverage.   Personally spent 35 minutes in patient care including extensive chart review (labs, imaging, progress/consult notes, vital signs), medically appropraite exam, discussed with treatment team, education to patient, family, and staff, documenting clinical information, medication review and management, coordination of care, and available advanced directive documents.

## 2024-04-05 NOTE — Progress Notes (Addendum)
  Progress Note   Patient: Stephen Bowen FMW:[id number] DOB: [birth year] DOA: 03/27/2024     9 DOS: the patient was seen and examined on 04/05/2024   Brief hospital course: 68 year old man underwent elective biopsy of pharyngeal mass, then required tracheostomy, was subsequently admitted by critical care postoperatively, successfully taken off the ventilator and transferred to the hospitalist service.  Then transferred to [facility] for initiation of radiation therapy.  Consultants ENT Admitted by critical care Radiation oncology Oncology Palliative care  Procedures/Events 10/3 direct laryngoscopy and biopsy, tracheostomy 10/6 open Stamm gastrostomy tube 10/6 transferred to hospitalist service  Tachycardic  Assessment and Plan: Squamous cell carcinoma of the pharynx  Status post tracheostomy Hemoptysis Continue with cuffed trach, deflated, do not exchange in case of bleed will need cuff inflated for airway protection .  7 weeks of radiation therapy planned.  Per report skilled nursing facility could not provide transport needs.  Will investigate further on Monday.   Failure to thrive  Solid food dysphagia Severe malnutrition in the context of chronic illness PEG tube in place Continue PEG tube feeds.   PAD CAD PMH CVA Status post bilateral AKA   Diabetes mellitus type 2 Hemoglobin A1c 5.9 Continue sliding scale insulin .  CBG stable.   Seizure disorder Dementia Continue Keppra   Disposition Admitted from [facility] under long-term care   Hemoptysis resolved  Difficult case, patient refusing some care and his goals of care are unclear.    Subjective:  Opens eyes, but does not engage  Per nursing has been refusing medications and some care  Physical Exam: Vitals:   04/05/24 0636 04/05/24 0902 04/05/24 1233 04/05/24 1637  BP: 98/72     Pulse: (!) 129     Resp: 20     Temp: 98.5 F (36.9 C)     TempSrc: Oral     SpO2: 97% 97% 97% 98%  Weight:       Height:       Physical Exam Vitals reviewed.  Constitutional:      General: He is not in acute distress.    Appearance: He is not ill-appearing or toxic-appearing.  Cardiovascular:     Rate and Rhythm: Normal rate and regular rhythm.     Heart sounds: No murmur heard. Pulmonary:     Effort: Pulmonary effort is normal. No respiratory distress.     Breath sounds: No wheezing, rhonchi or rales.  Neurological:     Mental Status: He is alert.     Data Reviewed: CBG stable EKG sinus tachycardia, no acute changes  Family Communication: none  Disposition: Status is: Inpatient Remains inpatient appropriate because: needs radiation therapy     Time spent: 20 minutes  Author: Toribio Door, MD 04/05/2024 4:40 PM  For on call review [email or web address].

## 2024-04-05 NOTE — Plan of Care (Signed)
 Patient still noncompliant with various medications and treatments.

## 2024-04-06 DIAGNOSIS — Z930 Tracheostomy status: Secondary | ICD-10-CM | POA: Diagnosis not present

## 2024-04-06 DIAGNOSIS — G40909 Epilepsy, unspecified, not intractable, without status epilepticus: Secondary | ICD-10-CM | POA: Diagnosis not present

## 2024-04-06 DIAGNOSIS — C140 Malignant neoplasm of pharynx, unspecified: Secondary | ICD-10-CM | POA: Diagnosis not present

## 2024-04-06 LAB — GLUCOSE, CAPILLARY
Glucose-Capillary: 109 mg/dL — ABNORMAL HIGH (ref 70–99)
Glucose-Capillary: 115 mg/dL — ABNORMAL HIGH (ref 70–99)
Glucose-Capillary: 136 mg/dL — ABNORMAL HIGH (ref 70–99)
Glucose-Capillary: 136 mg/dL — ABNORMAL HIGH (ref 70–99)

## 2024-04-06 MED ORDER — SODIUM CHLORIDE 0.9 % IV SOLN
1.0000 g | INTRAVENOUS | Status: DC
  Filled 2024-04-06: qty 10

## 2024-04-06 MED ORDER — IPRATROPIUM BROMIDE 0.02 % IN SOLN
0.5000 mg | Freq: Four times a day (QID) | RESPIRATORY_TRACT | Status: DC
Start: 2024-04-06 — End: 2024-04-11
  Administered 2024-04-06 – 2024-04-10 (×10): 0.5 mg via RESPIRATORY_TRACT
  Filled 2024-04-06 (×18): qty 2.5

## 2024-04-06 MED ORDER — CEFTRIAXONE SODIUM 1 G IJ SOLR
1.0000 g | INTRAMUSCULAR | Status: DC
  Filled 2024-04-06: qty 10

## 2024-04-06 MED ORDER — GLYCOPYRROLATE 1 MG PO TABS
1.0000 mg | ORAL_TABLET | Freq: Three times a day (TID) | ORAL | Status: DC | PRN
  Administered 2024-04-06: 1 mg
  Filled 2024-04-06 (×2): qty 1

## 2024-04-06 MED ORDER — METOPROLOL TARTRATE 25 MG PO TABS
37.5000 mg | ORAL_TABLET | Freq: Two times a day (BID) | ORAL | Status: DC
Start: 2024-04-06 — End: 2024-04-28
  Administered 2024-04-06 – 2024-04-27 (×26): 37.5 mg
  Filled 2024-04-06 (×34): qty 2

## 2024-04-06 NOTE — Plan of Care (Signed)
 Stephen Bowen has not refused oral meds via J tube but continues to refuse most other care.   Both IV's infiltrated when flushed and he's refusing to allow insertion of a new IV.  Also refuses SQ and IM injections.  Provider aware.

## 2024-04-06 NOTE — Progress Notes (Signed)
 SLP Cancellation Note  Patient Details Name: JABIN TAPP MRN: [id number] DOB: 1956-05-10   Cancelled treatment:       Reason Eval/Treat Not Completed: Patient declined, no reason specified; pt adamantly declined; copious tracheal secretions noted; ST will continue efforts as able.   Pat Kaiya Boatman,M.S.,CCC-SLP 04/06/2024, 1:19 PM

## 2024-04-06 NOTE — Progress Notes (Signed)
 Patient's IV's both infiltrated when flushed and he's refused to allow insertion of a new IV.  Provider notified.

## 2024-04-06 NOTE — Progress Notes (Signed)
  Progress Note   Patient: Stephen Bowen FMW:[id number] DOB: 01/01/1956 DOA: 03/27/2024     10 DOS: the patient was seen and examined on 04/06/2024   Brief hospital course: 68 year old man underwent elective biopsy of pharyngeal mass, then required tracheostomy, was subsequently admitted by critical care postoperatively, successfully taken off the ventilator and transferred to the hospitalist service.  Then transferred to [facility] for initiation of radiation therapy.  Consultants ENT Admitted by critical care Radiation oncology Oncology Palliative care  Procedures/Events 10/3 direct laryngoscopy and biopsy, tracheostomy 10/6 open Stamm gastrostomy tube 10/6 transferred to hospitalist service  Tachycardic  Assessment and Plan: Squamous cell carcinoma of the pharynx  Status post tracheostomy Hemoptysis Continue with cuffed trach, deflated, do not exchange in case of bleed will need cuff inflated for airway protection .  7 weeks of radiation therapy planned.  Per report skilled nursing facility could not provide transport needs.  Will investigate further if patient definitely pursues radiation therapy Try Atrovent nebs as well as Robinul  for secretions not responding to scopolamine   Failure to thrive  Solid food dysphagia Severe malnutrition in the context of chronic illness PEG tube in place Continue PEG tube feeds.   PAD CAD PMH CVA Status post bilateral AKA   Diabetes mellitus type 2 Hemoglobin A1c 5.9 Continue sliding scale insulin .  CBG stable.   Seizure disorder Dementia Continue Keppra   Disposition Admitted from [facility] under long-term care   Hemoptysis resolved     Subjective:  Not refusing care today  Physical Exam: Vitals:   04/05/24 2342 04/06/24 0313 04/06/24 0624 04/06/24 0909  BP:   115/89   Pulse: 89  94   Resp:      Temp:   97.8 F (36.6 C)   TempSrc:   Axillary   SpO2:  96% 95% 95%  Weight:      Height:        Physical Exam Vitals reviewed.  Constitutional:      General: He is not in acute distress.    Appearance: He is not ill-appearing or toxic-appearing.  Cardiovascular:     Rate and Rhythm: Normal rate and regular rhythm.     Heart sounds: No murmur heard. Pulmonary:     Effort: Pulmonary effort is normal. No respiratory distress.     Breath sounds: No wheezing, rhonchi or rales.  Neurological:     Mental Status: He is alert.     Data Reviewed: CBG stable  Family Communication: none  Disposition: Status is: Inpatient Remains inpatient appropriate because: radiation treatment     Time spent: 20 minutes  Author: Toribio Door, MD 04/06/2024 1:20 PM  For on call review [email or web address].

## 2024-04-07 DIAGNOSIS — C140 Malignant neoplasm of pharynx, unspecified: Secondary | ICD-10-CM | POA: Diagnosis not present

## 2024-04-07 DIAGNOSIS — Z930 Tracheostomy status: Secondary | ICD-10-CM | POA: Diagnosis not present

## 2024-04-07 DIAGNOSIS — G40909 Epilepsy, unspecified, not intractable, without status epilepticus: Secondary | ICD-10-CM | POA: Diagnosis not present

## 2024-04-07 DIAGNOSIS — J392 Other diseases of pharynx: Secondary | ICD-10-CM | POA: Diagnosis not present

## 2024-04-07 LAB — GLUCOSE, CAPILLARY
Glucose-Capillary: 108 mg/dL — ABNORMAL HIGH (ref 70–99)
Glucose-Capillary: 123 mg/dL — ABNORMAL HIGH (ref 70–99)
Glucose-Capillary: 130 mg/dL — ABNORMAL HIGH (ref 70–99)
Glucose-Capillary: 134 mg/dL — ABNORMAL HIGH (ref 70–99)

## 2024-04-07 MED ORDER — CEFTRIAXONE SODIUM 1 G IJ SOLR
1.0000 g | INTRAMUSCULAR | Status: DC
  Administered 2024-04-07: 1 g via INTRAMUSCULAR
  Filled 2024-04-07 (×3): qty 10

## 2024-04-07 MED ORDER — SODIUM CHLORIDE 0.9 % IV SOLN
1.0000 g | INTRAVENOUS | Status: DC
  Filled 2024-04-07: qty 10

## 2024-04-07 MED ORDER — STERILE WATER FOR INJECTION IJ SOLN
INTRAMUSCULAR | Status: AC
  Filled 2024-04-07: qty 10

## 2024-04-07 NOTE — Plan of Care (Signed)
°  Problem: Clinical Measurements: °Goal: Ability to maintain clinical measurements within normal limits will improve °Outcome: Progressing °  °Problem: Health Behavior/Discharge Planning: °Goal: Ability to manage health-related needs will improve °Outcome: Not Progressing °  °

## 2024-04-07 NOTE — Progress Notes (Signed)
**Note Stephen-Identified via Obfuscation**   Progress Note   Patient: Stephen Bowen FMW:[id number] DOB: [birth year] DOA: 03/27/2024     11 DOS: the patient was seen and examined on 04/07/2024   Brief hospital course: 68 year old man underwent elective biopsy of pharyngeal mass, then required tracheostomy, was subsequently admitted by critical care postoperatively, successfully taken off the ventilator and transferred to the hospitalist service.  Then transferred to [facility] for initiation of radiation therapy.  Consultants ENT Admitted by critical care Radiation oncology Oncology Palliative care  Procedures/Events 10/3 direct laryngoscopy and biopsy, tracheostomy 10/6 open Stamm gastrostomy tube 10/6 transferred to hospitalist service  Assessment and Plan: Squamous cell carcinoma of the pharynx  Status post tracheostomy Klebsiella in trach secretions Hemoptysis Continue with cuffed trach, deflated, do not exchange in case of bleed will need cuff inflated for airway protection .  7 weeks of radiation therapy planned.  Per report skilled nursing facility could not provide transport needs.  Care management can investigate further if patient definitely pursues radiation therapy Continue Atrovent nebs as well as Robinul  for secretions not responding to scopolamine Continue ceftriaxone to treat Klebsiella, total 5 days if patient will comply.  Has been refusing IV.   Failure to thrive  Solid food dysphagia Severe malnutrition in the context of chronic illness PEG tube in place Continue PEG tube feeds.   PAD CAD PMH CVA Status post bilateral AKA   Diabetes mellitus type 2 Hemoglobin A1c 5.9 Continue sliding scale insulin .  CBG stable.   Seizure disorder Dementia Continue Keppra   Disposition Admitted from [facility] under long-term care   Hemoptysis resolved  Challenging case, palliative medicine involved.  Intermittently refusing care.  Unclear whether he wants to proceed with radiation  therapy.    Subjective:  Refuses IV, does not really engage  Physical Exam: Vitals:   04/07/24 0300 04/07/24 0636 04/07/24 0801 04/07/24 1026  BP:  114/68    Pulse:  89 98 97  Resp:   19 (!) 24  Temp:  98.1 F (36.7 C)    TempSrc:  Oral    SpO2: 97% 96% 98% 96%  Weight:      Height:       Physical Exam Vitals reviewed.  Constitutional:      General: He is not in acute distress.    Appearance: He is ill-appearing. He is not toxic-appearing.  Cardiovascular:     Rate and Rhythm: Normal rate and regular rhythm.     Heart sounds: No murmur heard. Pulmonary:     Effort: Pulmonary effort is normal. No respiratory distress.     Breath sounds: No wheezing, rhonchi or rales.  Neurological:     Mental Status: He is alert.   Difficult to assess mood or affect, does not really engage  Data Reviewed: CBG stable  Family Communication: none  Disposition: Status is: Inpatient Remains inpatient appropriate because: GOC unclear     Time spent: 20 since transfer he has been intermittently refusing care, currently refusing an IV, unclear whether he will proceed with radiation therapy.  Minutes  Author: Toribio Door, MD 04/07/2024 2:53 PM  For on call review [email or web address].

## 2024-04-07 NOTE — Progress Notes (Signed)
 Speech Language Pathology Treatment: Dysphagia;Passy Muir Speaking valve  Patient Details Name: Stephen Bowen MRN: [id number] DOB: 10-12-1955 Today's Date: 04/07/2024 Time: 8560-8494 SLP Time Calculation (min) (ACUTE ONLY): 26 min  Assessment / Plan / Recommendation Clinical Impression  Pt seen for skilled SLP to address PMSV usage and dysphagia. Initially pt was shaking his head no to SLP but participated with max encouragement. Pt continues with copious secretions - which he is able to cough up frequently - expelling from his trach.   Placed PMSV for total of approx 8 minutes total with pt coughing off valve x2 due to secretions. However he can phonate easily with mild weakness with all vitals stable- and PMSV improves his communication efficiency to staff.    Communication board provided to him but he shook his head and said I'm trying appearing frustrated.  Provided him with extra time and encouraged him to look it over to see what may be helpful for him to use on the board for emergent purposes.    Recommend PMSV with all interactions from staff *with full supervision*. Pt able to manage swishing and expectorating water as well as tolerating single SMALL ice chips without overt difficulty - opening his mouth to indicate he was clear.     He was educated to clinical reasoning for ice chip consumption to decrease disuse muscle atrophy, improve secretions thinning, clearance, etc.   Using teach back, pt educated to findings/recommendations. SLP will follow up for MBS readiness - hopefully with improved secretion management. Pt mouthed  Thank You *without valve in place - when SLP left room!!! Encouraged by his participation and he is making progress!!   The purpose of ice for this pt is not only comfort, but to keep pharynx healthy and moist. Pooled secretions in an NPO pt can thicken and create harmful persistent secretions that pt can't mobilize. These can even form mucus plugs. A  few ice chips, given after oral care, can loosen secretions for pt to clear and maintain airway hygiene. Also can preserve some swallow function. Minimal ice is not expected to be harmful to patient even if some moisture is aspirated. Cued coughs after trials also will help airway protection. Encourage RN to continue these. Posted a sign for guidance.    HPI HPI: Patient is a 68 y.o. male with PMH: HTN, dementia, encephalopathy, MI, HLD, PAD, CAD, daily tobacco use. He presented to the hospital on 03/27/2024 for biopsy of pharyngeal mass and tracheostomy placement. He had been having difficulty swallowing x10 days and mainly was drinking fluids for his nutrition. 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 ENT following and recommended cuff deflation trial on 10/4. Received PEG 10/6. Barium esophagram 03/20/24 with posterior pharyngeal filling defect at site of the patient's  known pharyngeal mass. Contrast traverses the pharynx and passes  into the esophagus. However, there is some extension of contrast  into the nasopharynx while swallowing and this is likely due to  partial pharyngeal obstruction.  2. Somewhat prominent cricopharyngeus impression upon the  hypopharynx/cervical esophagus.  Pt has not been seen for SLP for dysphagia/PMSV management follow up at [facility] as he has frequently been refusing.      SLP Plan  MBS;Other (Comment) When he is clinically ready         Recommendations  Diet recommendations: NPO;Other(comment) (rinse and expectorate with water, single small ice chips) Compensations: Slow rate;Small sips/bites Postural Changes and/or Swallow Maneuvers: Seated upright 90 degrees      Patient may use Passy-Muir Speech  Valve: Intermittently with supervision MD: Please consider changing trach tube to : Cuffless           Oral care QID   Frequent or constant Supervision/Assistance Dysphagia, pharyngeal phase (R13.13)     MBS;Other (Comment)    Stephen POUR, MS [facility] SLP Acute Rehab  Services Office [phone]  Stephen Bowen  04/07/2024, 3:31 PM

## 2024-04-07 NOTE — Progress Notes (Signed)
 Nutrition Follow-up  DOCUMENTATION CODES:   Severe malnutrition in context of chronic illness  INTERVENTION:  - Continue current tube feeding via PEG: Osmolite 1.2 at 55 ml/h (1320 ml per day) +19mL Q6H FWF (400 ml per day) Provides 1584 kcal, 73 gm protein, 1482 ml free water daily    - Monitor weight trends.   - No weight taken in >1 week. Added daily weights.  NUTRITION DIAGNOSIS:   Severe Malnutrition related to chronic illness as evidenced by severe fat depletion, severe muscle depletion, percent weight loss. *ongoing  GOAL:   Patient will meet greater than or equal to 90% of their needs *met with TF  MONITOR:   PO intake, Diet advancement, Supplement acceptance, Skin, TF tolerance, Weight trends, Labs  REASON FOR ASSESSMENT:   Consult Enteral/tube feeding initiation and management  ASSESSMENT:   Pt with PMH significant for: HTN, PAD, bilateral AKAs, CAD, HLD, diabetes mellitus, MI, dementia, CKD II. Admitted for tracheostomy placement and biopsy of pharyngeal mass. Has been with prolonged difficulty swallowing and some hemoptysis.  10/03 - admitted; tracheostomy placed; laryngoscopy and biopsy 10/04 - transitioned to trach collar 10/05 - transferred out of ICU 10/06 - PEG placed 10/7 - TF initiated 10/9 - Transferred to [facility] for initiation of radiation therapy   Patient continues to often be refusing care and medications. Continues to be agitated. He adamantly declined SLP eval per their note yesterday.  Palliative care has now been consulted. Palliative recommending that if patient continues to refuse interventions (such as radiation) then they would recommend to focus on comfort.  Thankfully, has been accepting of continuous tube feeds and tolerating well. Will continue current regimen.     Admit Weight: 94# Current Weight: 94# (as of 10/6) I&O's: +1.0L since admit   Medications reviewed and include: Colace, Miralax , 100mL Q6H FWF   Labs reviewed:   No BMP since 10/8  Diet Order:   Diet Order             Diet NPO time specified  Diet effective now                   EDUCATION NEEDS:  Not appropriate for education at this time  Skin:  Skin Assessment: Skin Integrity Issues: Skin Integrity Issues:: DTI DTI: coccyx  Last BM:  10/12 - rectal tube  Height:  Ht Readings from Last 1 Encounters:  03/30/24 5' 6 (1.676 m)   Weight:  Wt Readings from Last 1 Encounters:  03/30/24 42.7 kg   Ideal Body Weight:  43.8 kg  BMI:  Body mass index is 15.19 kg/m.  Estimated Nutritional Needs:  Kcal:  1500-1700 kcals Protein:  70-85g Fluid:  1.5-1.7L/day    Trude Ned RD, LDN Contact via Secure Chat.

## 2024-04-07 NOTE — Plan of Care (Signed)
  Problem: Education: Goal: Knowledge of General Education information will improve Description: Including pain rating scale, medication(s)/side effects and non-pharmacologic comfort measures Outcome: Progressing   Problem: Health Behavior/Discharge Planning: Goal: Ability to manage health-related needs will improve Outcome: Progressing   Problem: Education: Goal: Knowledge about tracheostomy care/management will improve Outcome: Progressing   Problem: Role Relationship: Goal: Ability to communicate will improve Outcome: Progressing

## 2024-04-07 NOTE — Progress Notes (Signed)
 Daily Progress Note   Patient Name: Stephen Bowen       Date: 04/07/2024 DOB: 04-15-1956  Age: 68 y.o. MRN#: [id number] Attending Physician: Stephen Toribio SQUIBB, MD Primary Care Physician: Stephen Glisson, MD Admit Date: 03/27/2024 Length of Stay: 11 days  Reason for Consultation/Follow-up: Establishing goals of care  Subjective:   CC: Patient avoids eye contact and does not verbalize or engage Palliative following up regarding complex medical decision making.  Subjective: Reviewed EMR including recent documentation from hospitalist and RN.  Patient off/on has still been refusing care at times.      Again encouraged patient to further consider his pathways moving forward.  If patient continues to refuse interventions, including radiation, would recommend focusing on comfort.  Recommend continued use of Robinul  via PEG for management of secretions.  Palliative medicine team continuing to follow along with patient's medical journey.  Objective:   Vital Signs:  BP 114/68 (BP Location: Right Arm)   Pulse 97   Temp 98.1 F (36.7 C) (Oral)   Resp (!) 24   Ht 5' 6 (1.676 m)   Wt 42.7 kg   SpO2 96%   BMI 15.19 kg/m   Physical Exam: General: NAD, cachectic, frail, chronically ill-appearing Increased work of breathing, burden of secretions, sounds coarse.  tracheostomy in place with trach collar on 5L/min FiO2 28% Extremities: Bilateral BKAs Neuro: Awake, but doesn't engage.   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  Oncology and radiation oncology consulted for recommendations and evaluation.  Palliative medicine team consulted to assist with complex medical decision making.  Recommendations/Plan: # Complex medical decision making/goals of care:   - Attempted to discuss care with patient as detailed above in HPI.  Patient appears visibly frustrated with situation and though has been refusing care, mouths I do not know about stopping radiation to transition to comfort focused care.  Currently patient is planned to receive 7 weeks of radiation.  Initially nursing facility had noted would not be able to transport back and forth so patient would remain hospitalized for this.  Hospitalist and TOC going to follow-up on this.  Discussed that if patient continues to refuse interventions such as radiation when offered, would be considered to focus on comfort and get hospice involved moving forward.  Had informed sister, Stephen Bowen/reported HCPOA, about this during discussion on 04/04/2024.  Palliative medicine team will continue to engage in conversations as able and appropriate moving forward                -  Code Status: Limited: Do not attempt resuscitation (DNR) -DNR-LIMITED -Do Not Intubate/DNI     # Psycho-social/Spiritual Support:  - Support System: Stephen Bowen noted she is patient's HCPOA and POA   # Discharge Planning:  To Be Determined  Discussed with: Patient   Thank you for allowing the palliative  care team to participate in the care Stephen Bowen.  Mod MDM Stephen Serve MD.  Palliative Care Provider PMT # [phone]  If patient remains symptomatic despite maximum doses, please call PMT at [phone] between 0700 and  1900. Outside of these hours, please call attending, as PMT does not have night coverage.

## 2024-04-07 NOTE — TOC Progression Note (Signed)
 Transition of Care [facility]) - Progression Note   Patient Details  Name: Stephen Bowen MRN: [id number] Date of Birth: 1956-05-13  Transition of Care [facility]) CM/SW Contact  Duwaine GORMAN Aran, LCSW Phone Number: 04/07/2024, 10:50 AM  Clinical Narrative: Patient is currently scheduled for radiation through December 9. As patient is sometimes refusing care, care management will continue to follow in the event the discharge plan changes from completing radiation prior to returning to LTC at [facility].  Expected Discharge Plan: Skilled Nursing Facility Barriers to Discharge: Continued Medical Work up  Expected Discharge Plan and Services In-house Referral: Clinical Social Work Living arrangements for the past 2 months: Skilled Nursing Facility  Social Drivers of Health (SDOH) Interventions SDOH Screenings   Food Insecurity: Patient Unable To Answer (03/27/2024)  Housing: Low Risk  (03/27/2024)  Transportation Needs: No Transportation Needs (03/27/2024)  Utilities: Not At Risk (03/27/2024)  Depression (PHQ2-9): Low Risk  (03/02/2024)  Social Connections: Patient Unable To Answer (03/27/2024)  Tobacco Use: High Risk (03/30/2024)   Readmission Risk Interventions     No data to display

## 2024-04-08 ENCOUNTER — Ambulatory Visit: Admitting: Radiation Oncology

## 2024-04-08 DIAGNOSIS — J392 Other diseases of pharynx: Secondary | ICD-10-CM | POA: Diagnosis not present

## 2024-04-08 DIAGNOSIS — C140 Malignant neoplasm of pharynx, unspecified: Secondary | ICD-10-CM | POA: Diagnosis not present

## 2024-04-08 DIAGNOSIS — Z515 Encounter for palliative care: Secondary | ICD-10-CM | POA: Diagnosis not present

## 2024-04-08 DIAGNOSIS — R627 Adult failure to thrive: Secondary | ICD-10-CM | POA: Diagnosis not present

## 2024-04-08 LAB — GLUCOSE, CAPILLARY
Glucose-Capillary: 104 mg/dL — ABNORMAL HIGH (ref 70–99)
Glucose-Capillary: 114 mg/dL — ABNORMAL HIGH (ref 70–99)
Glucose-Capillary: 122 mg/dL — ABNORMAL HIGH (ref 70–99)
Glucose-Capillary: 140 mg/dL — ABNORMAL HIGH (ref 70–99)

## 2024-04-08 LAB — CULTURE, RESPIRATORY W GRAM STAIN

## 2024-04-08 MED ORDER — GLYCOPYRROLATE 0.2 MG/ML IJ SOLN
0.2000 mg | Freq: Three times a day (TID) | INTRAMUSCULAR | Status: DC
  Administered 2024-04-09 – 2024-04-13 (×15): 0.2 mg via INTRAVENOUS
  Filled 2024-04-08 (×16): qty 1

## 2024-04-08 MED ORDER — OXYCODONE HCL 5 MG PO TABS
5.0000 mg | ORAL_TABLET | ORAL | Status: DC
Start: 2024-04-08 — End: 2024-04-09
  Administered 2024-04-08 – 2024-04-09 (×2): 5 mg
  Filled 2024-04-08 (×2): qty 1

## 2024-04-08 NOTE — Plan of Care (Signed)
  Problem: Nutrition: Goal: Adequate nutrition will be maintained Outcome: Progressing   Problem: Education: Goal: Knowledge about tracheostomy care/management will improve Outcome: Progressing   Problem: Education: Goal: Knowledge of General Education information will improve Description: Including pain rating scale, medication(s)/side effects and non-pharmacologic comfort measures Outcome: Not Progressing   Problem: Health Behavior/Discharge Planning: Goal: Ability to manage health-related needs will improve Outcome: Not Progressing   Problem: Activity: Goal: Risk for activity intolerance will decrease Outcome: Not Progressing   Problem: Pain Managment: Goal: General experience of comfort will improve and/or be controlled Outcome: Not Progressing   Problem: Skin Integrity: Goal: Risk for impaired skin integrity will decrease Outcome: Not Progressing   Problem: Coping: Goal: Ability to adjust to condition or change in health will improve Outcome: Not Progressing   Patient has been consistently refusing medications and ADL care.

## 2024-04-08 NOTE — Progress Notes (Signed)
 CT planning for the patient's head and neck radiation was attempted today.  However the patient could not lay flat due to copious secretions.  Suctioning was performed however it was not adequate.  He also reported significant back pain.  I talked to the patient about trying again another day but he said that he does not want to reattempt radiation planning.  I then reached out to the inpatient team to let them know that he has refused further radiation planning.  I recommend that he be seen by palliative care for symptom management.  If his symptoms are adequately palliated so that he can tolerate another attempt at radiation planning I am happy to meet with the patient again if he is willing.  Otherwise I will see him back on an as-needed basis.  Our head and neck navigator will inform the patient's sister/POA of the above. -----------------------------------  Lauraine Golden, MD

## 2024-04-08 NOTE — Progress Notes (Signed)
 PROGRESS NOTE    Stephen Bowen  FMW:[id number] DOB: [birth year] DOA: 03/27/2024 PCP: Trinidad Glisson, MD   Brief Narrative:  68 year old man underwent elective biopsy of pharyngeal mass, then required tracheostomy on 03/27/2024, was subsequently admitted by critical care postoperatively, successfully taken off the ventilator.  Underwent gastrostomy tube placement on 03/30/2024.  He was subsequently transferred to the hospitalist service. Then transferred to [facility] for initiation of radiation therapy.  Oncology consulted: Patient not interested in pursuing chemotherapy at this time.  Palliative care following goals for goals of care.  Patient has been intermittently refusing care including radiation.  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  Underwent gastrostomy tube placement on 03/30/2024.  He was subsequently transferred to the hospitalist service. Then transferred to [facility] for initiation of radiation therapy. 7 weeks of radiation therapy planned.  Oncology consulted: Patient not interested in pursuing chemotherapy at this time.  Palliative care following goals for goals of care.  Patient has been intermittently refusing care including radiation.  If patient continues to refuse care, might benefit from hospice/comfort measures -Continue trach care.  Currently on 6 L oxygen via trach - Continue Rocephin for providencia and Klebsiella and trach secretions.  Today is day #2.  Failure to thrive  Dysphagia Severe malnutrition -Continue PEG tube feeding  PAD CAD Past medical history of CVA Status post bilateral AKA - Continue  aspirin , statin  Hypertension Hyperlipidemia -Continue statin and metoprolol   History of seizure disorder Possible dementia - Continue Keppra.  Delirium precautions.  Fall precautions  Diabetes mellitus type 2 - A1c 5.9.  Continue CBGs with SSI    DVT prophylaxis: Heparin  subcutaneous Code Status: DNR Family Communication: None at bedside Disposition Plan: Status is: Inpatient Remains inpatient appropriate because: Of severity of illness    Consultants: ENT/critical CARE/oncology/radiation oncology/palliative care  Procedures: As above  Antimicrobials:  Anti-infectives (From admission, onward)    Start     Dose/Rate Route Frequency Ordered Stop   04/07/24 1500  cefTRIAXone (ROCEPHIN) injection 1 g        1 g Intramuscular Every 24 hours 04/07/24 1256 04/11/24 2359   04/07/24 1200  cefTRIAXone (ROCEPHIN) 1 g in sodium chloride  0.9 % 100 mL IVPB  Status:  Discontinued        1 g 200 mL/hr over 30 Minutes Intravenous Every 24 hours 04/07/24 1102 04/07/24 1256   04/06/24 1700  cefTRIAXone (ROCEPHIN) injection 1 g  Status:  Discontinued        1 g Intramuscular Every 24 hours 04/06/24 1536 04/07/24 1102   04/06/24 1500  cefTRIAXone (ROCEPHIN) 1 g in sodium chloride  0.9 % 100 mL IVPB  Status:  Discontinued        1 g 200 mL/hr over 30 Minutes Intravenous Every 24 hours 04/06/24 1444 04/06/24 1536   03/30/24 1216  ceFAZolin  (ANCEF ) 2-4 GM/100ML-% IVPB       Note to Pharmacy: [name] S: cabinet override      03/30/24 1216 03/30/24 1248   03/30/24 1215  ceFAZolin  (ANCEF ) IVPB 2g/100 mL premix        2 g 200 mL/hr over 30 Minutes Intravenous  Once 03/30/24 1115 03/30/24 1236        Subjective: Patient seen  and examined at bedside.  Poor historian.  No fever, vomiting, agitation reported.  Objective: Vitals:   04/08/24 0043 04/08/24 0430 04/08/24 0503 04/08/24 0825  BP:  113/73    Pulse:  86    Resp:  18    Temp:  98.4 F (36.9 C)    TempSrc:      SpO2:  99% 100% 98% 98%  Weight:      Height:        Intake/Output Summary (Last 24 hours) at 04/08/2024 1136 Last data filed at 04/08/2024 0900 Gross per 24 hour  Intake 560 ml  Output 1550 ml  Net -990 ml   Filed Weights   03/27/24 1016 03/27/24 1708 03/30/24 1151  Weight: 51.1 kg 42.7 kg 42.7 kg    Examination:  General exam: Appears calm and comfortable.  Looks chronically ill and deconditioned. ENT: On 6 L oxygen via tracheostomy. Respiratory system: Bilateral decreased breath sounds at bases with scattered crackles. Cardiovascular system: S1 & S2 heard, Rate controlled Gastrointestinal system: Abdomen is nondistended, soft and nontender. Normal bowel sounds heard.  PEG tube present. Extremities: Bilateral AKA present  Central nervous system: Awake, slow to respond.  Poor historian.  No focal neurological deficits. Moving extremities Skin: No rashes, lesions or ulcers Psychiatry: Flat affect.  Not agitated.    Data Reviewed: I have personally reviewed following labs and imaging studies  CBC: No results for input(s): WBC, NEUTROABS, HGB, HCT, MCV, PLT in the last 168 hours. Basic Metabolic Panel: No results for input(s): NA, K, CL, CO2, GLUCOSE, BUN, CREATININE, CALCIUM , MG, PHOS in the last 168 hours. GFR: Estimated Creatinine Clearance: 47.4 mL/min (by C-G formula based on SCr of 0.9 mg/dL). Liver Function Tests: No results for input(s): AST, ALT, ALKPHOS, BILITOT, PROT, ALBUMIN  in the last 168 hours. No results for input(s): LIPASE, AMYLASE in the last 168 hours. No results for input(s): AMMONIA in the last 168 hours. Coagulation Profile: No results for input(s): INR, PROTIME in the last 168 hours. Cardiac Enzymes: No results for input(s): CKTOTAL, CKMB, CKMBINDEX, TROPONINI in the last 168 hours. BNP (last 3 results) No results for input(s): PROBNP in the last 8760 hours. HbA1C: No results for input(s):  HGBA1C in the last 72 hours. CBG: Recent Labs  Lab 04/07/24 0630 04/07/24 1213 04/07/24 1837 04/08/24 0008 04/08/24 0627  GLUCAP 134* 108* 123* 140* 104*   Lipid Profile: No results for input(s): CHOL, HDL, LDLCALC, TRIG, CHOLHDL, LDLDIRECT in the last 72 hours. Thyroid Function Tests: No results for input(s): TSH, T4TOTAL, FREET4, T3FREE, THYROIDAB in the last 72 hours. Anemia Panel: No results for input(s): VITAMINB12, FOLATE, FERRITIN, TIBC, IRON, RETICCTPCT in the last 72 hours. Sepsis Labs: No results for input(s): PROCALCITON, LATICACIDVEN in the last 168 hours.  Recent Results (from the past 240 hours)  Expectorated Sputum Assessment w Gram Stain, Rflx to Resp Cult     Status: None   Collection Time: 04/03/24 12:10 PM   Specimen: Expectorated Sputum  Result Value Ref Range Status   Specimen Description EXPECTORATED SPUTUM  Final   Special Requests NONE  Final   Sputum evaluation   Final    THIS SPECIMEN IS ACCEPTABLE FOR SPUTUM CULTURE Performed at [facility], [address], [place], [place] [address]    Report Status 04/03/2024 FINAL  Final  Culture, Respiratory w Gram Stain     Status: None   Collection Time: 04/03/24 12:10 PM  Result Value Ref Range Status   Specimen Description  Final    EXPECTORATED SPUTUM Performed at [facility], [address] [place], [place] [address]    Special Requests   Final    NONE Reflexed from [id number] Performed at [facility], [address], [place], [place] [address]    Gram Stain   Final    MODERATE SQUAMOUS EPITHELIAL CELLS PRESENT FEW WBC PRESENT, PREDOMINANTLY PMN ABUNDANT GRAM NEGATIVE RODS MODERATE GRAM POSITIVE COCCI FEW GRAM POSITIVE RODS Performed at [facility], [address] [place], [place] [address]    Culture   Final    ABUNDANT PROVIDENCIA STUARTII ABUNDANT KLEBSIELLA PNEUMONIAE Two isolates with  different morphologies were identified as the same organism.The most resistant organism was reported. MOST RESISTANT ORGANISM REPORTED FOR BOTH ORGANISM 1 P. STUARTII AND 2 K. PNEUMONIAE ABUNDANT PROTEUS MIRABILIS    Report Status 04/08/2024 FINAL  Final   Organism ID, Bacteria PROVIDENCIA STUARTII  Final   Organism ID, Bacteria KLEBSIELLA PNEUMONIAE  Final   Organism ID, Bacteria PROTEUS MIRABILIS  Final      Susceptibility   Klebsiella pneumoniae - MIC*    AMPICILLIN >=32 RESISTANT Resistant     CEFAZOLIN  (NON-URINE) 4 INTERMEDIATE Intermediate     CEFEPIME <=0.12 SENSITIVE Sensitive     ERTAPENEM <=0.12 SENSITIVE Sensitive     CEFTRIAXONE <=0.25 SENSITIVE Sensitive     CIPROFLOXACIN <=0.06 SENSITIVE Sensitive     GENTAMICIN <=1 SENSITIVE Sensitive     MEROPENEM <=0.25 SENSITIVE Sensitive     TRIMETH /SULFA  <=20 SENSITIVE Sensitive     AMPICILLIN/SULBACTAM 8 SENSITIVE Sensitive     PIP/TAZO Value in next row Sensitive      8 SENSITIVEThis is a modified FDA-approved test that has been validated and its performance characteristics determined by the reporting laboratory.  This laboratory is certified under the Clinical Laboratory Improvement Amendments CLIA as qualified to perform high complexity clinical laboratory testing.    * ABUNDANT KLEBSIELLA PNEUMONIAE   Proteus mirabilis - MIC*    AMPICILLIN Value in next row Resistant      8 SENSITIVEThis is a modified FDA-approved test that has been validated and its performance characteristics determined by the reporting laboratory.  This laboratory is certified under the Clinical Laboratory Improvement Amendments CLIA as qualified to perform high complexity clinical laboratory testing.    CEFAZOLIN  (NON-URINE) Value in next row Intermediate      8 SENSITIVEThis is a modified FDA-approved test that has been validated and its performance characteristics determined by the reporting laboratory.  This laboratory is certified under the Clinical  Laboratory Improvement Amendments CLIA as qualified to perform high complexity clinical laboratory testing.    CEFEPIME Value in next row Sensitive      8 SENSITIVEThis is a modified FDA-approved test that has been validated and its performance characteristics determined by the reporting laboratory.  This laboratory is certified under the Clinical Laboratory Improvement Amendments CLIA as qualified to perform high complexity clinical laboratory testing.    ERTAPENEM Value in next row Sensitive      8 SENSITIVEThis is a modified FDA-approved test that has been validated and its performance characteristics determined by the reporting laboratory.  This laboratory is certified under the Clinical Laboratory Improvement Amendments CLIA as qualified to perform high complexity clinical laboratory testing.    CEFTRIAXONE Value in next row Sensitive      8 SENSITIVEThis is a modified FDA-approved test that has been validated and its performance characteristics determined by the reporting laboratory.  This laboratory is  certified under the Clinical Laboratory Improvement Amendments CLIA as qualified to perform high complexity clinical laboratory testing.    CIPROFLOXACIN Value in next row Resistant      8 SENSITIVEThis is a modified FDA-approved test that has been validated and its performance characteristics determined by the reporting laboratory.  This laboratory is certified under the Clinical Laboratory Improvement Amendments CLIA as qualified to perform high complexity clinical laboratory testing.    GENTAMICIN Value in next row Sensitive      8 SENSITIVEThis is a modified FDA-approved test that has been validated and its performance characteristics determined by the reporting laboratory.  This laboratory is certified under the Clinical Laboratory Improvement Amendments CLIA as qualified to perform high complexity clinical laboratory testing.    MEROPENEM Value in next row Sensitive      8 SENSITIVEThis is a  modified FDA-approved test that has been validated and its performance characteristics determined by the reporting laboratory.  This laboratory is certified under the Clinical Laboratory Improvement Amendments CLIA as qualified to perform high complexity clinical laboratory testing.    TRIMETH /SULFA  Value in next row Resistant      8 SENSITIVEThis is a modified FDA-approved test that has been validated and its performance characteristics determined by the reporting laboratory.  This laboratory is certified under the Clinical Laboratory Improvement Amendments CLIA as qualified to perform high complexity clinical laboratory testing.    AMPICILLIN/SULBACTAM Value in next row Sensitive      8 SENSITIVEThis is a modified FDA-approved test that has been validated and its performance characteristics determined by the reporting laboratory.  This laboratory is certified under the Clinical Laboratory Improvement Amendments CLIA as qualified to perform high complexity clinical laboratory testing.    PIP/TAZO Value in next row Sensitive      <=4 SENSITIVEThis is a modified FDA-approved test that has been validated and its performance characteristics determined by the reporting laboratory.  This laboratory is certified under the Clinical Laboratory Improvement Amendments CLIA as qualified to perform high complexity clinical laboratory testing.    * ABUNDANT PROTEUS MIRABILIS   Providencia stuartii - MIC*    AMPICILLIN Value in next row Resistant      <=4 SENSITIVEThis is a modified FDA-approved test that has been validated and its performance characteristics determined by the reporting laboratory.  This laboratory is certified under the Clinical Laboratory Improvement Amendments CLIA as qualified to perform high complexity clinical laboratory testing.    CEFEPIME Value in next row Sensitive      <=4 SENSITIVEThis is a modified FDA-approved test that has been validated and its performance characteristics determined by  the reporting laboratory.  This laboratory is certified under the Clinical Laboratory Improvement Amendments CLIA as qualified to perform high complexity clinical laboratory testing.    ERTAPENEM Value in next row Intermediate      <=4 SENSITIVEThis is a modified FDA-approved test that has been validated and its performance characteristics determined by the reporting laboratory.  This laboratory is certified under the Clinical Laboratory Improvement Amendments CLIA as qualified to perform high complexity clinical laboratory testing.    CEFTRIAXONE Value in next row Sensitive      <=4 SENSITIVEThis is a modified FDA-approved test that has been validated and its performance characteristics determined by the reporting laboratory.  This laboratory is certified under the Clinical Laboratory Improvement Amendments CLIA as qualified to perform high complexity clinical laboratory testing.    CIPROFLOXACIN Value in next row Resistant      <=4 SENSITIVEThis is a  modified FDA-approved test that has been validated and its performance characteristics determined by the reporting laboratory.  This laboratory is certified under the Clinical Laboratory Improvement Amendments CLIA as qualified to perform high complexity clinical laboratory testing.    GENTAMICIN Value in next row Resistant      <=4 SENSITIVEThis is a modified FDA-approved test that has been validated and its performance characteristics determined by the reporting laboratory.  This laboratory is certified under the Clinical Laboratory Improvement Amendments CLIA as qualified to perform high complexity clinical laboratory testing.    MEROPENEM Value in next row Resistant      <=4 SENSITIVEThis is a modified FDA-approved test that has been validated and its performance characteristics determined by the reporting laboratory.  This laboratory is certified under the Clinical Laboratory Improvement Amendments CLIA as qualified to perform high complexity clinical  laboratory testing.    TRIMETH /SULFA  Value in next row Resistant      <=4 SENSITIVEThis is a modified FDA-approved test that has been validated and its performance characteristics determined by the reporting laboratory.  This laboratory is certified under the Clinical Laboratory Improvement Amendments CLIA as qualified to perform high complexity clinical laboratory testing.    AMPICILLIN/SULBACTAM Value in next row Resistant      <=4 SENSITIVEThis is a modified FDA-approved test that has been validated and its performance characteristics determined by the reporting laboratory.  This laboratory is certified under the Clinical Laboratory Improvement Amendments CLIA as qualified to perform high complexity clinical laboratory testing.    PIP/TAZO Value in next row Sensitive      <=4 SENSITIVEThis is a modified FDA-approved test that has been validated and its performance characteristics determined by the reporting laboratory.  This laboratory is certified under the Clinical Laboratory Improvement Amendments CLIA as qualified to perform high complexity clinical laboratory testing.    * ABUNDANT PROVIDENCIA STUARTII         Radiology Studies: No results found.      Scheduled Meds:  aspirin   81 mg Per Tube Daily   atorvastatin   40 mg Per Tube q1800   cefTRIAXone (ROCEPHIN) IM  1 g Intramuscular Q24H   diclofenac Sodium  2 g Topical QID   docusate  100 mg Per Tube BID   famotidine  20 mg Per Tube BID   free water  100 mL Per Tube Q6H   heparin  injection (subcutaneous)  5,000 Units Subcutaneous Q8H   insulin  aspart  0-9 Units Subcutaneous Q6H   ipratropium  0.5 mg Nebulization QID   levETIRAcetam  500 mg Per Tube BID   methocarbamol   500 mg Per Tube Q8H   metoprolol  tartrate  37.5 mg Per Tube BID   mouth rinse  15 mL Mouth Rinse 4 times per day   polyethylene glycol  17 g Per Tube Daily   scopolamine  1 patch Transdermal Q72H   Continuous Infusions:  feeding supplement (OSMOLITE 1.2  CAL) 1,000 mL (04/07/24 1149)          Sophie Mao, MD [facility] 04/08/2024, 11:36 AM

## 2024-04-08 NOTE — Progress Notes (Signed)
 Oncology Nurse Navigator Documentation   Stephen Bowen presented for his CT simulation today but was unable to tolerate it due to tracheostomy secretions and back pain while laying flat for the mask making and CT scan. At this time he declines any further attempts at radiation. I called and spoke with his sister, Stephen Bowen and informed her of the above. I gave her my direct number and told her that she could call me at anytime. If Stephen Bowen is able to tolerate laying flat in the future, Dr. Izell is willing to treat him with radiation if Stephen Bowen is agreeable.   Stephen Jefferson RN, BSN, OCN Head & Neck Oncology Nurse Navigator [facility] Phone # [phone]  Fax # [phone]

## 2024-04-08 NOTE — TOC Progression Note (Addendum)
 Transition of Care [facility]) - Progression Note    Patient Details  Name: Stephen Bowen MRN: [id number] Date of Birth: 1956-01-19  Transition of [facility] CM/SW Contact  Heather DELENA Saltness, LCSW Phone Number: 04/08/2024, 3:07 PM  Clinical Narrative:    Per palliative care team, pt continues to refuse further radiation treatment. Pt mouths I don't know when asked about transitioning to comfort care/hospice. Palliative care following, assisting with goals of care. Discharge plan, pending goals of care. TOC will continue to follow and assist with discharge planning.   Expected Discharge Plan: Skilled Nursing Facility Barriers to Discharge: Continued Medical Work up  Expected Discharge Plan and Services In-house Referral: Clinical Social Work     Living arrangements for the past 2 months: Skilled Nursing Facility                 Social Drivers of Health (SDOH) Interventions SDOH Screenings   Food Insecurity: Patient Unable To Answer (03/27/2024)  Housing: Low Risk  (03/27/2024)  Transportation Needs: No Transportation Needs (03/27/2024)  Utilities: Not At Risk (03/27/2024)  Depression (PHQ2-9): Low Risk  (03/02/2024)  Social Connections: Patient Unable To Answer (03/27/2024)  Tobacco Use: High Risk (03/30/2024)    Readmission Risk Interventions     No data to display          Signed: Heather Saltness, MSW, LCSW Clinical Social Worker Inpatient Care Management 04/08/2024 3:08 PM

## 2024-04-08 NOTE — Progress Notes (Addendum)
 Daily Progress Note   Patient Name: Stephen Bowen       Date: 04/08/2024 DOB: [birth year]  Age: 68 y.o. MRN#: [id number] Attending Physician: Cheryle Page, MD Primary Care Physician: Trinidad Glisson, MD Admit Date: 03/27/2024 Length of Stay: 12 days  Reason for Consultation/Follow-up: Establishing goals of care  Subjective:   CC: Patient visibly frustrated and attempting to speak but does not have speaking valve in place and was not found in the room.   Subjective: Patient was not able to complete radiation therapy today due to secretions and discomfort when laying flat. Continues to have copious amount of secretions from trach.  Will schedule oxycodone  and robinul  to help with pain and secretions so that the patient can tolerate radiation therapy going forward. Suggest more airway clearance for secretion management.   Objective:   Vital Signs:  BP 113/73 (BP Location: Left Arm)   Pulse 86   Temp 98.4 F (36.9 C)   Resp 18   Ht 5' 6 (1.676 m)   Wt 42.7 kg   SpO2 96%   BMI 15.19 kg/m   Physical Exam: General: NAD, cachectic, frail, chronically ill-appearing Increased work of breathing, burden of secretions, sounds coarse.  tracheostomy in place with trach collar on  Extremities: Bilateral BKAs Neuro: Awake, attempting to speak, but frustrated without speaking valve  Assessment & Plan:   Assessment: Patient is a 68 year old male with a past medical history of squamous cell carcinoma of the pharynx status post tracheostomy, failure to thrive  status post PEG tube placement, severe protein calorie malnutrition, PAD, CAD, history of CVA, history of tobacco use, history of alcohol use, diabetes mellitus type 2, seizure disorder, dementia, bilateral BKA and long-term care resident [facility] in [place] who was admitted on 03/27/2024 after undergoing elective pharyngeal mass biopsy that unfortunately began to ooze requiring cauterization and due to the risk of  recurrent bleeding after extubation, decision was made to proceed with tracheostomy placement.  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  Oncology and radiation oncology consulted for recommendations and evaluation.  Palliative medicine team consulted to assist with complex medical decision making.  Recommendations/Plan: # Complex medical decision making/goals of care:   - Attempted to discuss care with patient as detailed above in HPI.  Patient appears visibly frustrated with situation and though has been refusing care, mouths I do not know about stopping radiation to transition to comfort focused care.  Currently patient is planned to receive 7 weeks of radiation.  Initially nursing facility had noted would not be able to transport back and forth so patient would remain hospitalized for this.  Hospitalist and TOC going to follow-up on this.  Discussed that if patient continues to refuse interventions such as radiation when offered, would be considered to focus on comfort and get hospice involved moving forward.  Had informed sister, Stephen Bowen/reported HCPOA, about this during discussion on 04/04/2024.  Palliative medicine team will continue to engage in conversations as able and appropriate moving forward                -  Code Status: Limited: Do not attempt resuscitation (DNR) -DNR-LIMITED -Do Not Intubate/DNI     # Psycho-social/Spiritual Support:  - Support System: Stephen Bowen noted she is patient's HCPOA and POA   # Discharge Planning:  To Be Determined  Discussed with: Patient   Time: 30 minutes  Thank you for allowing the palliative care team to participate in the care [facility]  Stephen Bowen.  Stephen FORBES Kemps, PA-C Palliative Care Provider PMT # [phone]  If patient remains symptomatic despite maximum doses, please call PMT at [phone] between 0700 and 1900. Outside of these hours, please call attending, as PMT does not have night coverage.

## 2024-04-08 NOTE — Progress Notes (Signed)
   04/08/24 2057  RT Progression Team  O2 Device Tracheostomy Collar  FiO2 (%) 28 %  SpO2 98 %  Tracheostomy Shiley Flexible 6 mm Cuffed  Placement Date/Time: 03/27/24 1424   Placed By: (c) Other (Comment)  Brand: Shiley Flexible  Size (mm): 6 mm  Style: Cuffed  Status Secured with trach ties  Site Assessment  (unable to assess)  Site Care  (pt refused)  Inner Cannula Care  (patent)  Ties Assessment  (uanble to assess)  Cuff Pressure (cm H2O)  (deflated)  Tracheostomy Equipment at bedside Yes and checklist posted at head of bed   Patient refused all RT assess with trach care and refused breathing treatment. Pt would not let this RT touch around his trach but on assessment this RT did not hear any rhonchi or see any distress with patient. He was resting comfortable in bed with the trach collar on

## 2024-04-08 NOTE — Progress Notes (Signed)
 OT Cancellation Note  Patient Details Name: Stephen Bowen MRN: [id number] DOB: [birth year]   Cancelled Treatment:    Reason Eval/Treat Not Completed: Fatigue/lethargy limiting ability to participate  OT attempted initial evaluation. Nursing and RT bedside assisting to locate PMSV that was misplaced. Found in bed under torso. OT cleansed and RT placed back on trach collar. OT able to speak with patient who stated I am so tired, not now. OT explained purpose and benefits of services and patient agreeable to trial another time. OT will re-attempt eval tomorrow.  Justiss Gerbino OT/L Acute Rehabilitation Department  [phone]   04/08/2024, 3:58 PM

## 2024-04-09 DIAGNOSIS — R627 Adult failure to thrive: Secondary | ICD-10-CM | POA: Diagnosis not present

## 2024-04-09 DIAGNOSIS — Z66 Do not resuscitate: Secondary | ICD-10-CM | POA: Diagnosis not present

## 2024-04-09 DIAGNOSIS — J392 Other diseases of pharynx: Secondary | ICD-10-CM | POA: Diagnosis not present

## 2024-04-09 DIAGNOSIS — Z515 Encounter for palliative care: Secondary | ICD-10-CM | POA: Diagnosis not present

## 2024-04-09 LAB — GLUCOSE, CAPILLARY
Glucose-Capillary: 126 mg/dL — ABNORMAL HIGH (ref 70–99)
Glucose-Capillary: 114 mg/dL — ABNORMAL HIGH (ref 70–99)

## 2024-04-09 MED ORDER — OXYCODONE HCL 5 MG PO TABS
5.0000 mg | ORAL_TABLET | ORAL | Status: DC | PRN
  Administered 2024-04-12 – 2024-04-18 (×7): 5 mg
  Filled 2024-04-09 (×9): qty 1

## 2024-04-09 MED ORDER — SODIUM CHLORIDE 0.9 % IV SOLN
1.0000 g | INTRAVENOUS | Status: AC
  Administered 2024-04-10 – 2024-04-15 (×6): 1 g via INTRAVENOUS
  Filled 2024-04-09 (×7): qty 10

## 2024-04-09 MED ORDER — ONDANSETRON HCL 4 MG/2ML IJ SOLN
4.0000 mg | Freq: Four times a day (QID) | INTRAMUSCULAR | Status: DC | PRN
  Administered 2024-04-09: 4 mg via INTRAVENOUS
  Filled 2024-04-09: qty 2

## 2024-04-09 NOTE — Progress Notes (Signed)
   04/09/24 0843  RT Progression Team  Trach Progression (S)  Other (comment) (Removed sutures from trach per MD order, site cleansed, dried, dressing applied, along with new trach ties. PMV intact, pt is phonating well, ATC 28%, Sp02 96%, Jamal is secure.)

## 2024-04-09 NOTE — Progress Notes (Signed)
 Pt refused majority of care including all medications aside from Robinul  due to increased coughing spells with copious sputum and changing out tube feeding bottle. Pt was agreeable with most of the VS and CBG checks until end of this shift. PT would hold his hands up when staff came to door while expressing, I just want to sleep, I don't want anything at all. Pt was agreeable to let this RN perform trach suction x1 after giving robinul  after his efforts for clearing airway orally and independently with [place] were inadequate. This RN encouraged med intake to help pt be more comfortable but refused everything else. No acute distress noted and will continue to monitor.

## 2024-04-09 NOTE — Progress Notes (Signed)
 Daily Progress Note   Patient Name: Stephen Bowen       Date: 04/09/2024 DOB: 03-01-1956  Age: 68 y.o. MRN#: [id number] Attending Physician: Cheryle Page, MD Primary Care Physician: Trinidad Glisson, MD Admit Date: 03/27/2024 Length of Stay: 13 days  Reason for Consultation/Follow-up: Establishing goals of care  Subjective:  Subjective: Patient was pleasant and conversing with speaking valve in place. Was able to express that he is frustrated with the constant interventions while being in the hospital. Discussed with patient about his goals and wishes but not able to fully understand his goals as his responses sometimes were that he does not know what he wants, but knows he gets frustrated with the frequent interruptions.   Clearly asked patient that if we shifted focus to comfort measures it would mean stopping radiation therapy, tube feeds, and focused only on medications to address his pain and anxiety, knowing that his time left to live would be short, he was unsure about that and responded with I don't know. He does express that he wants to go home but does not know if he wants to come back to the hospital if he deteriorates again, he responded I'll let my sister handle that. Inquired that if his sister were to decide to stop aggressive medical interventions knowing that he can pass in peace, he said he would be okay with that but is not willing to make a decision at this time. I was able to speak with his sister Stephen Bowen) over the phone and shared with her my discussion with the patient. Sister expressed that she would like him to be able to go back to [facility] with rehab and hopes that he can get radiation therapy that is local to the facility. Stephen Bowen is not ready to have her brother pursue hospice at this time and she expressed that she has been struggling with the situation as she is very close with her brother.   Patient received oxycodone  5 mg per tube x2 in 24 hours, refused 4  scheduled administrations of oxycodone  - expressed that he does not feel like he needed the medication for pain. Patient received robinul  0.2 mg x2 in 24 hours and refused 1 scheduled administration. Reports that his secretions have been better in the last 24 hours.   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  Oncology and radiation oncology consulted for recommendations and evaluation.  Palliative medicine team consulted to assist with complex medical decision making.  Recommendations/Plan: # Complex medical decision making/goals of care:   - Attempted to discuss care with patient as detailed above in HPI.  Patient appears visibly frustrated with situation and though has been refusing care, mouths I do  not know about stopping radiation to transition to comfort focused care.  Currently patient is planned to receive 7 weeks of radiation.  Initially nursing facility had noted would not be able to transport back and forth so patient would remain hospitalized for this.  Hospitalist and TOC going to follow-up on this.  Discussed that if patient continues to refuse interventions such as radiation when offered, would be considered to focus on comfort and get hospice involved moving forward.  Had informed sister, Stephen Bowen/reported HCPOA, about this during discussion on 04/04/2024.  Palliative medicine team will continue to engage in conversations as able and appropriate moving forward                -  Code Status: Limited: Do not attempt resuscitation (DNR) -DNR-LIMITED -Do Not Intubate/DNI  - Outpatient palliative following with rehab  - Will continue to follow peripherally, no new inpatient palliative specific interventions   # Pain Patient has only received 2 out of 6 scheduled oxycodone  medications, will make PRN  - Oxycodone  5 mg per tube PRN # Secretions Patient received 2 out of 3 scheduled robinul  in the last 24 hours, will continue scheduled TID  - Robinul  0.2 mg IV TID   # Psycho-social/Spiritual Support:  - Support System: Stephen Bowen noted she is patient's Product manager and POA   # Discharge Planning: Skilled nursing facility with rehab and palliative to follow outpatient.   Discussed with: Patient, Stephen Bowen (sister), Jeryl MD, Cheryle MD  Time: 40 minutes  Thank you for allowing the palliative care team to participate in the care Stephen Bowen.   Stephen FORBES Kemps, PA-C Palliative Care Provider PMT # [phone]  If patient remains symptomatic despite maximum doses, please call PMT at [phone] between 0700 and 1900. Outside of these hours, please call attending, as PMT does not have night coverage.

## 2024-04-09 NOTE — Progress Notes (Addendum)
 Patient has refused medications per tube, SQ and IV. Education provided, pt is not agreeable at this time.  Additionally pt refused pericare.

## 2024-04-09 NOTE — Progress Notes (Signed)
 OT Cancellation Note  Patient Details Name: Stephen Bowen MRN: [id number] DOB: 02-25-1956   Cancelled Treatment:    Reason Eval/Treat Not Completed: Patient declined, no reason specified. Pt 's RN also reported that he declined therapy today before OT arrival. OT will follow up next available time  Jacques Karna Loose 04/09/2024, 11:10 AM

## 2024-04-09 NOTE — Progress Notes (Signed)
 PROGRESS NOTE    Stephen Bowen  FMW:[id number] DOB: 22-Nov-1955 DOA: 03/27/2024 PCP: Trinidad Glisson, MD   Brief Narrative:  68 year old man underwent elective biopsy of pharyngeal mass, then required tracheostomy on 03/27/2024, was subsequently admitted by critical care postoperatively, successfully taken off the ventilator.  Underwent gastrostomy tube placement on 03/30/2024.  He was subsequently transferred to the hospitalist service. Then transferred to [facility] for initiation of radiation therapy.  Oncology consulted: Patient not interested in pursuing chemotherapy at this time.  Palliative care following goals for goals of care.  Patient has been intermittently refusing care including radiation.  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  Underwent gastrostomy tube placement on 03/30/2024.  He was subsequently transferred to the hospitalist service. Then transferred to [facility] for initiation of radiation therapy. 7 weeks of radiation therapy planned.  Oncology consulted: Patient not interested in pursuing chemotherapy at this time.  Palliative care following goals for goals of care.  Patient has been intermittently refusing care including radiation.  If patient continues to refuse care, might benefit from hospice/comfort measures -Continue trach care.  Currently on 6 L oxygen via trach - Currently on Rocephin for providencia and Klebsiella and trach secretions.  Patient with Rocephin on 04/08/2024  Failure to thrive  Dysphagia Severe malnutrition -Continue PEG tube feeding  PAD CAD Past medical history of CVA Status post  bilateral AKA - Continue aspirin , statin if patient agrees  Hypertension Hyperlipidemia -Continue statin and metoprolol  if patient agrees  History of seizure disorder Possible dementia - Continue Keppra if patient agrees.  Delirium precautions.  Fall precautions  Diabetes mellitus type 2 - A1c 5.9.  Continue CBGs with SSI    DVT prophylaxis: Heparin  subcutaneous Code Status: DNR Family Communication: None at bedside Disposition Plan: Status is: Inpatient Remains inpatient appropriate because: Of severity of illness    Consultants: ENT/critical CARE/oncology/radiation oncology/palliative care  Procedures: As above  Antimicrobials:  Anti-infectives (From admission, onward)    Start     Dose/Rate Route Frequency Ordered Stop   04/07/24 1500  cefTRIAXone (ROCEPHIN) injection 1 g        1 g Intramuscular Every 24 hours 04/07/24 1256 04/11/24 2359   04/07/24 1200  cefTRIAXone (ROCEPHIN) 1 g in sodium chloride  0.9 % 100 mL IVPB  Status:  Discontinued        1 g 200 mL/hr over 30 Minutes Intravenous Every 24 hours 04/07/24 1102 04/07/24 1256   04/06/24 1700  cefTRIAXone (ROCEPHIN) injection 1 g  Status:  Discontinued        1 g Intramuscular Every 24 hours 04/06/24 1536 04/07/24 1102   04/06/24 1500  cefTRIAXone (ROCEPHIN) 1 g in sodium chloride  0.9 % 100 mL IVPB  Status:  Discontinued        1 g 200 mL/hr over 30 Minutes Intravenous Every 24 hours 04/06/24 1444 04/06/24 1536   03/30/24 1216  ceFAZolin  (ANCEF ) 2-4 GM/100ML-% IVPB       Note to Pharmacy: [name] S: cabinet override      03/30/24 1216 03/30/24 1248   03/30/24 1215  ceFAZolin  (ANCEF ) IVPB 2g/100 mL premix        2 g 200 mL/hr over 30 Minutes Intravenous  Once 03/30/24 1115 03/30/24  1236        Subjective: Patient seen and examined at bedside.  Poor historian.  As per nursing staff, patient refused most of the medications yesterday and overnight.  No agitation, fever or vomiting  reported Objective: Vitals:   04/08/24 1219 04/08/24 1539 04/08/24 1940 04/08/24 2057  BP:   122/85   Pulse:  (!) 110    Resp:   17   Temp:   98 F (36.7 C)   TempSrc:   Oral   SpO2: 96% 96% 97% 98%  Weight:      Height:        Intake/Output Summary (Last 24 hours) at 04/09/2024 0832 Last data filed at 04/09/2024 9385 Gross per 24 hour  Intake 200 ml  Output 600 ml  Net -400 ml   Filed Weights   03/27/24 1016 03/27/24 1708 03/30/24 1151  Weight: 51.1 kg 42.7 kg 42.7 kg    Examination:  General: No distress.  Looks chronically ill and deconditioned. ENT/neck: No thyromegaly.  JVD is not elevated.  On 6 L oxygen by trach respiratory: Decreased breath sounds at bases bilaterally with some crackles; no wheezing  CVS: S1-S2 heard, intermittently tachycardic  abdominal: Soft, nontender, slightly distended; no organomegaly, normal bowel sounds are heard.  PEG tube present Extremities: Bilateral AKA present CNS: Extremely slow to respond.  Extremely poor historian.  No focal neurologic deficit.  Moves extremities Lymph: No obvious lymphadenopathy Skin: No obvious ecchymosis/lesions  psych: Extremely flat affect.  Currently not agitated.      Data Reviewed: I have personally reviewed following labs and imaging studies  CBC: No results for input(s): WBC, NEUTROABS, HGB, HCT, MCV, PLT in the last 168 hours. Basic Metabolic Panel: No results for input(s): NA, K, CL, CO2, GLUCOSE, BUN, CREATININE, CALCIUM , MG, PHOS in the last 168 hours. GFR: Estimated Creatinine Clearance: 47.4 mL/min (by C-G formula based on SCr of 0.9 mg/dL). Liver Function Tests: No results for input(s): AST, ALT, ALKPHOS, BILITOT, PROT, ALBUMIN  in the last 168 hours. No results for input(s): LIPASE, AMYLASE in the last 168 hours. No results for input(s): AMMONIA in the last 168 hours. Coagulation Profile: No results for input(s): INR, PROTIME in the  last 168 hours. Cardiac Enzymes: No results for input(s): CKTOTAL, CKMB, CKMBINDEX, TROPONINI in the last 168 hours. BNP (last 3 results) No results for input(s): PROBNP in the last 8760 hours. HbA1C: No results for input(s): HGBA1C in the last 72 hours. CBG: Recent Labs  Lab 04/07/24 1837 04/08/24 0008 04/08/24 0627 04/08/24 1758 04/08/24 2357  GLUCAP 123* 140* 104* 114* 122*   Lipid Profile: No results for input(s): CHOL, HDL, LDLCALC, TRIG, CHOLHDL, LDLDIRECT in the last 72 hours. Thyroid Function Tests: No results for input(s): TSH, T4TOTAL, FREET4, T3FREE, THYROIDAB in the last 72 hours. Anemia Panel: No results for input(s): VITAMINB12, FOLATE, FERRITIN, TIBC, IRON, RETICCTPCT in the last 72 hours. Sepsis Labs: No results for input(s): PROCALCITON, LATICACIDVEN in the last 168 hours.  Recent Results (from the past 240 hours)  Expectorated Sputum Assessment w Gram Stain, Rflx to Resp Cult     Status: None   Collection Time: 04/03/24 12:10 PM   Specimen: Expectorated Sputum  Result Value Ref Range Status   Specimen Description EXPECTORATED SPUTUM  Final   Special Requests NONE  Final   Sputum evaluation   Final    THIS SPECIMEN IS ACCEPTABLE FOR SPUTUM CULTURE Performed at [facility], [address] [place], [place] [address]  Report Status 04/03/2024 FINAL  Final  Culture, Respiratory w Gram Stain     Status: None   Collection Time: 04/03/24 12:10 PM  Result Value Ref Range Status   Specimen Description   Final    EXPECTORATED SPUTUM Performed at [facility], [address] [place], [place] [address]    Special Requests   Final    NONE Reflexed from [id number] Performed at [facility], [address], [place], [place] [address]    Gram Stain   Final    MODERATE SQUAMOUS EPITHELIAL CELLS PRESENT FEW WBC PRESENT, PREDOMINANTLY PMN ABUNDANT GRAM NEGATIVE  RODS MODERATE GRAM POSITIVE COCCI FEW GRAM POSITIVE RODS Performed at [facility], [address] [place], [place] [address]    Culture   Final    ABUNDANT PROVIDENCIA STUARTII ABUNDANT KLEBSIELLA PNEUMONIAE Two isolates with different morphologies were identified as the same organism.The most resistant organism was reported. MOST RESISTANT ORGANISM REPORTED FOR BOTH ORGANISM 1 P. STUARTII AND 2 K. PNEUMONIAE ABUNDANT PROTEUS MIRABILIS    Report Status 04/08/2024 FINAL  Final   Organism ID, Bacteria PROVIDENCIA STUARTII  Final   Organism ID, Bacteria KLEBSIELLA PNEUMONIAE  Final   Organism ID, Bacteria PROTEUS MIRABILIS  Final      Susceptibility   Klebsiella pneumoniae - MIC*    AMPICILLIN >=32 RESISTANT Resistant     CEFAZOLIN  (NON-URINE) 4 INTERMEDIATE Intermediate     CEFEPIME <=0.12 SENSITIVE Sensitive     ERTAPENEM <=0.12 SENSITIVE Sensitive     CEFTRIAXONE <=0.25 SENSITIVE Sensitive     CIPROFLOXACIN <=0.06 SENSITIVE Sensitive     GENTAMICIN <=1 SENSITIVE Sensitive     MEROPENEM <=0.25 SENSITIVE Sensitive     TRIMETH /SULFA  <=20 SENSITIVE Sensitive     AMPICILLIN/SULBACTAM 8 SENSITIVE Sensitive     PIP/TAZO Value in next row Sensitive      8 SENSITIVEThis is a modified FDA-approved test that has been validated and its performance characteristics determined by the reporting laboratory.  This laboratory is certified under the Clinical Laboratory Improvement Amendments CLIA as qualified to perform high complexity clinical laboratory testing.    * ABUNDANT KLEBSIELLA PNEUMONIAE   Proteus mirabilis - MIC*    AMPICILLIN Value in next row Resistant      8 SENSITIVEThis is a modified FDA-approved test that has been validated and its performance characteristics determined by the reporting laboratory.  This laboratory is certified under the Clinical Laboratory Improvement Amendments CLIA as qualified to perform high complexity clinical laboratory testing.    CEFAZOLIN   (NON-URINE) Value in next row Intermediate      8 SENSITIVEThis is a modified FDA-approved test that has been validated and its performance characteristics determined by the reporting laboratory.  This laboratory is certified under the Clinical Laboratory Improvement Amendments CLIA as qualified to perform high complexity clinical laboratory testing.    CEFEPIME Value in next row Sensitive      8 SENSITIVEThis is a modified FDA-approved test that has been validated and its performance characteristics determined by the reporting laboratory.  This laboratory is certified under the Clinical Laboratory Improvement Amendments CLIA as qualified to perform high complexity clinical laboratory testing.    ERTAPENEM Value in next row Sensitive      8 SENSITIVEThis is a modified FDA-approved test that has been validated and its performance characteristics determined by the reporting laboratory.  This laboratory is certified under the Clinical Laboratory Improvement Amendments CLIA as qualified to perform high complexity clinical laboratory testing.  CEFTRIAXONE Value in next row Sensitive      8 SENSITIVEThis is a modified FDA-approved test that has been validated and its performance characteristics determined by the reporting laboratory.  This laboratory is certified under the Clinical Laboratory Improvement Amendments CLIA as qualified to perform high complexity clinical laboratory testing.    CIPROFLOXACIN Value in next row Resistant      8 SENSITIVEThis is a modified FDA-approved test that has been validated and its performance characteristics determined by the reporting laboratory.  This laboratory is certified under the Clinical Laboratory Improvement Amendments CLIA as qualified to perform high complexity clinical laboratory testing.    GENTAMICIN Value in next row Sensitive      8 SENSITIVEThis is a modified FDA-approved test that has been validated and its performance characteristics determined by the  reporting laboratory.  This laboratory is certified under the Clinical Laboratory Improvement Amendments CLIA as qualified to perform high complexity clinical laboratory testing.    MEROPENEM Value in next row Sensitive      8 SENSITIVEThis is a modified FDA-approved test that has been validated and its performance characteristics determined by the reporting laboratory.  This laboratory is certified under the Clinical Laboratory Improvement Amendments CLIA as qualified to perform high complexity clinical laboratory testing.    TRIMETH /SULFA  Value in next row Resistant      8 SENSITIVEThis is a modified FDA-approved test that has been validated and its performance characteristics determined by the reporting laboratory.  This laboratory is certified under the Clinical Laboratory Improvement Amendments CLIA as qualified to perform high complexity clinical laboratory testing.    AMPICILLIN/SULBACTAM Value in next row Sensitive      8 SENSITIVEThis is a modified FDA-approved test that has been validated and its performance characteristics determined by the reporting laboratory.  This laboratory is certified under the Clinical Laboratory Improvement Amendments CLIA as qualified to perform high complexity clinical laboratory testing.    PIP/TAZO Value in next row Sensitive      <=4 SENSITIVEThis is a modified FDA-approved test that has been validated and its performance characteristics determined by the reporting laboratory.  This laboratory is certified under the Clinical Laboratory Improvement Amendments CLIA as qualified to perform high complexity clinical laboratory testing.    * ABUNDANT PROTEUS MIRABILIS   Providencia stuartii - MIC*    AMPICILLIN Value in next row Resistant      <=4 SENSITIVEThis is a modified FDA-approved test that has been validated and its performance characteristics determined by the reporting laboratory.  This laboratory is certified under the Clinical Laboratory Improvement  Amendments CLIA as qualified to perform high complexity clinical laboratory testing.    CEFEPIME Value in next row Sensitive      <=4 SENSITIVEThis is a modified FDA-approved test that has been validated and its performance characteristics determined by the reporting laboratory.  This laboratory is certified under the Clinical Laboratory Improvement Amendments CLIA as qualified to perform high complexity clinical laboratory testing.    ERTAPENEM Value in next row Intermediate      <=4 SENSITIVEThis is a modified FDA-approved test that has been validated and its performance characteristics determined by the reporting laboratory.  This laboratory is certified under the Clinical Laboratory Improvement Amendments CLIA as qualified to perform high complexity clinical laboratory testing.    CEFTRIAXONE Value in next row Sensitive      <=4 SENSITIVEThis is a modified FDA-approved test that has been validated and its performance characteristics determined by the reporting laboratory.  This laboratory is  certified under the Clinical Laboratory Improvement Amendments CLIA as qualified to perform high complexity clinical laboratory testing.    CIPROFLOXACIN Value in next row Resistant      <=4 SENSITIVEThis is a modified FDA-approved test that has been validated and its performance characteristics determined by the reporting laboratory.  This laboratory is certified under the Clinical Laboratory Improvement Amendments CLIA as qualified to perform high complexity clinical laboratory testing.    GENTAMICIN Value in next row Resistant      <=4 SENSITIVEThis is a modified FDA-approved test that has been validated and its performance characteristics determined by the reporting laboratory.  This laboratory is certified under the Clinical Laboratory Improvement Amendments CLIA as qualified to perform high complexity clinical laboratory testing.    MEROPENEM Value in next row Resistant      <=4 SENSITIVEThis is a modified  FDA-approved test that has been validated and its performance characteristics determined by the reporting laboratory.  This laboratory is certified under the Clinical Laboratory Improvement Amendments CLIA as qualified to perform high complexity clinical laboratory testing.    TRIMETH /SULFA  Value in next row Resistant      <=4 SENSITIVEThis is a modified FDA-approved test that has been validated and its performance characteristics determined by the reporting laboratory.  This laboratory is certified under the Clinical Laboratory Improvement Amendments CLIA as qualified to perform high complexity clinical laboratory testing.    AMPICILLIN/SULBACTAM Value in next row Resistant      <=4 SENSITIVEThis is a modified FDA-approved test that has been validated and its performance characteristics determined by the reporting laboratory.  This laboratory is certified under the Clinical Laboratory Improvement Amendments CLIA as qualified to perform high complexity clinical laboratory testing.    PIP/TAZO Value in next row Sensitive      <=4 SENSITIVEThis is a modified FDA-approved test that has been validated and its performance characteristics determined by the reporting laboratory.  This laboratory is certified under the Clinical Laboratory Improvement Amendments CLIA as qualified to perform high complexity clinical laboratory testing.    * ABUNDANT PROVIDENCIA STUARTII         Radiology Studies: No results found.      Scheduled Meds:  aspirin   81 mg Per Tube Daily   atorvastatin   40 mg Per Tube q1800   cefTRIAXone (ROCEPHIN) IM  1 g Intramuscular Q24H   diclofenac Sodium  2 g Topical QID   docusate  100 mg Per Tube BID   famotidine  20 mg Per Tube BID   free water  100 mL Per Tube Q6H   glycopyrrolate   0.2 mg Intravenous TID   heparin  injection (subcutaneous)  5,000 Units Subcutaneous Q8H   insulin  aspart  0-9 Units Subcutaneous Q6H   ipratropium  0.5 mg Nebulization QID   levETIRAcetam  500  mg Per Tube BID   methocarbamol   500 mg Per Tube Q8H   metoprolol  tartrate  37.5 mg Per Tube BID   mouth rinse  15 mL Mouth Rinse 4 times per day   oxyCODONE   5 mg Per Tube Q4H   polyethylene glycol  17 g Per Tube Daily   scopolamine  1 patch Transdermal Q72H   Continuous Infusions:  feeding supplement (OSMOLITE 1.2 CAL) 1,000 mL (04/09/24 0000)          Sophie Mao, MD [facility] 04/09/2024, 8:32 AM

## 2024-04-09 NOTE — Progress Notes (Signed)
 SLP Cancellation Note  Patient Details Name: SUNDIATA FERRICK MRN: [id number] DOB: 17-Nov-1955   Cancelled treatment:       Reason Eval/Treat Not Completed: Other (comment) (pt refusing care, will continue efforts)  Madelin POUR, MS [facility] SLP Acute Rehab Services Office [phone]  Nicolas Emmie Caldron 04/09/2024, 4:23 PM

## 2024-04-10 DIAGNOSIS — J392 Other diseases of pharynx: Secondary | ICD-10-CM | POA: Diagnosis not present

## 2024-04-10 LAB — GLUCOSE, CAPILLARY
Glucose-Capillary: 121 mg/dL — ABNORMAL HIGH (ref 70–99)
Glucose-Capillary: 125 mg/dL — ABNORMAL HIGH (ref 70–99)
Glucose-Capillary: 92 mg/dL (ref 70–99)

## 2024-04-10 MED ORDER — SODIUM BICARBONATE/SODIUM CHLORIDE MOUTHWASH: OROMUCOSAL | Status: DC | PRN

## 2024-04-10 NOTE — Progress Notes (Signed)
 Pt vomited three times and agreed to take Zofran  but refused most of the medications per tube,IV. Education was provided.pt remains very anxious and restless.

## 2024-04-10 NOTE — Progress Notes (Signed)
 Daily Progress Note   Patient Name: Stephen Bowen       Date: 04/10/2024 DOB: 1955/12/03  Age: 68 y.o. MRN#: [id number] Attending Physician: Cheryle Page, MD Primary Care Physician: Trinidad Glisson, MD Admit Date: 03/27/2024 Length of Stay: 14 days  Reason for Consultation/Follow-up: Establishing goals of care  Subjective:  Subjective: Patient continues to express frustration with his current condition, using suction, less secretions, denies pain Throws his hands up in the air Attempts to mouth words States that his mouth is bothering him He is unsure about radiation - defers to sister, sister not in favor of comfort care, wants him to re consider radiation, wants him to go to SNF rehab towards the end of this hospitalization.  No mucositis No thrush but patient visibly uncomfortable    Objective:   Vital Signs:  BP 111/74 (BP Location: Left Arm)   Pulse 100   Temp 98.2 F (36.8 C) (Oral)   Resp (!) 22   Ht 5' 6 (1.676 m)   Wt 42.7 kg   SpO2 97%   BMI 15.19 kg/m   Physical Exam: General: NAD, cachectic, frail, chronically ill-appearing, speaking valve in place tracheostomy in place with trach collar on 5 L/min 28% FiO2 Extremities: Bilateral BKAs Neuro: Alert and oriented, able to participate in conversation compared to prior visits  Assessment & Plan:   Assessment: Patient is a 68 year old male with a past medical history of squamous cell carcinoma of the pharynx status post tracheostomy, failure to thrive  status post PEG tube placement, severe protein calorie malnutrition, PAD, CAD, history of CVA, history of tobacco use, history of alcohol use, diabetes mellitus type 2, seizure disorder, dementia, bilateral BKA and long-term care resident [facility] in [place] who was admitted on 03/27/2024 after undergoing elective pharyngeal mass biopsy that unfortunately began to ooze requiring cauterization and due to the risk of recurrent bleeding after  extubation, decision was made to proceed with tracheostomy placement.  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  Oncology and radiation oncology consulted for recommendations and evaluation.  Palliative medicine team consulted to assist with complex medical decision making.  Recommendations/Plan: # Complex medical decision making/goals of care:  10-16 - Attempted to discuss care with patient as detailed above in HPI.  Patient appears visibly frustrated with situation and though has been refusing care, mouths I do not know about stopping radiation to transition to comfort focused care.  Currently patient is planned to receive 7 weeks of radiation.  Initially nursing facility had noted would not be able to transport back and forth so patient would remain hospitalized for this.  Hospitalist and TOC going to follow-up on this.   Will reiterate that if patient continues to refuse interventions such as radiation when offered, would be appropriate to focus on comfort and get hospice involved moving forward.  Had informed sister, Wendy/reported HCPOA, about this during discussion on 04/09/2024.  At present, she is not on board with comfort care.  Palliative medicine team will continue to engage in conversations as able and appropriate moving forward                -  Code Status: Limited: Do not attempt resuscitation (DNR) -DNR-LIMITED -Do Not Intubate/DNI  - Outpatient palliative following with rehab     # Pain, mouth pain:     - Oxycodone  5 mg per tube PRN Sodium bicarbonate/sodium chloride  mouthwash 1000 mL Route: Mouth rinse Dose/Frequency: Swish and spit 15 mL  three times daily (TID) as needed for mouth pain Indication: Oral discomfort in head and neck cancer; no evidence of thrush or mucositis # Secretions Patient received 2 out of 3 scheduled robinul  in the last 24 hours, will continue scheduled TID  - Robinul  0.2 mg IV TID   # Psycho-social/Spiritual  Support:  - Support System: Leveda Hasten noted she is patient's Product manager and POA   # Discharge Planning: Skilled nursing facility with rehab and palliative to follow outpatient.   Discussed with: Patient   High MDM  Thank you for allowing the palliative care team to participate in the care [name].   [name], MD Palliative Care Provider PMT # [phone]  If patient remains symptomatic despite maximum doses, please call PMT at [phone] between 0700 and 1900. Outside of these hours, please call attending, as PMT does not have night coverage.

## 2024-04-10 NOTE — Progress Notes (Signed)
 PROGRESS NOTE    Stephen Bowen  FMW:[id number] DOB: 1955/07/18 DOA: 03/27/2024 PCP: Stephen Glisson, MD   Brief Narrative:  68 year old man underwent elective biopsy of pharyngeal mass, then required tracheostomy on 03/27/2024, was subsequently admitted by critical care postoperatively, successfully taken off the ventilator.  Underwent gastrostomy tube placement on 03/30/2024.  He was subsequently transferred to the hospitalist service. Then transferred to [facility] for initiation of radiation therapy.  Oncology consulted: Patient not interested in pursuing chemotherapy at this time.  Palliative care following goals for goals of care.  Patient has been intermittently refusing care including radiation.  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  Underwent gastrostomy tube placement on 03/30/2024.  He was subsequently transferred to the hospitalist service. Then transferred to [facility] for initiation of radiation therapy. 7 weeks of radiation therapy planned.  Oncology consulted: Patient not interested in pursuing chemotherapy at this time.  Palliative care following goals for goals of care.  Patient has been intermittently refusing care including radiation.  If patient continues to refuse care, might benefit from hospice/comfort measures -Continue trach care.  Currently on 6 L oxygen via trach - Currently on Rocephin for providencia and Klebsiella and trach secretions.  Continue Rocephin for at least 7 days - Patient continues to refuse care intermittently including medications.  He apparently has requested that his sister make decision  regarding radiation treatment.  Apparently, the sister wants him to receive radiation treatments.  Radiation oncology team aware: Will try and attempt radiation again next week.  Not sure what radiation alone will help in his current condition.  Failure to thrive  Dysphagia Severe malnutrition -Continue PEG tube feeding  PAD CAD Past medical history of CVA Status post bilateral AKA - Continue aspirin , statin if patient agrees  Hypertension Hyperlipidemia -Continue statin and metoprolol  if patient agrees  History of seizure disorder Possible dementia - Continue Keppra if patient agrees.  Delirium precautions.  Fall precautions  Diabetes mellitus type 2 - A1c 5.9.  Continue CBGs with SSI    DVT prophylaxis: Heparin  subcutaneous Code Status: DNR Family Communication: None at bedside Disposition Plan: Status is: Inpatient Remains inpatient appropriate because: Of severity of illness    Consultants: ENT/critical CARE/oncology/radiation oncology/palliative care  Procedures: As above  Antimicrobials:  Anti-infectives (From admission, onward)    Start     Dose/Rate Route Frequency Ordered Stop   04/09/24 1600  cefTRIAXone (ROCEPHIN) 1 g in sodium chloride  0.9 % 100 mL IVPB        1 g 200 mL/hr over 30 Minutes Intravenous Every 24 hours 04/09/24 1456     04/07/24 1500  cefTRIAXone (ROCEPHIN) injection 1 g  Status:  Discontinued        1 g Intramuscular Every 24 hours 04/07/24 1256 04/09/24 1455   04/07/24 1200  cefTRIAXone (ROCEPHIN) 1 g in sodium chloride  0.9 % 100 mL IVPB  Status:  Discontinued        1 g 200 mL/hr over 30 Minutes Intravenous Every 24 hours 04/07/24 1102 04/07/24 1256   04/06/24 1700  cefTRIAXone (ROCEPHIN) injection 1 g  Status:  Discontinued        1 g Intramuscular Every 24 hours 04/06/24 1536 04/07/24 1102   04/06/24 1500  cefTRIAXone (ROCEPHIN) 1 g in sodium chloride  0.9 % 100 mL IVPB  Status:  Discontinued        1 g 200 mL/hr over 30 Minutes  Intravenous Every 24 hours 04/06/24 1444 04/06/24 1536   03/30/24 1216  ceFAZolin  (ANCEF ) 2-4 GM/100ML-% IVPB       Note to Pharmacy: [name] S: cabinet override      03/30/24 1216 03/30/24 1248   03/30/24 1215  ceFAZolin  (ANCEF ) IVPB 2g/100 mL premix        2 g 200 mL/hr over 30 Minutes Intravenous  Once 03/30/24 1115 03/30/24 1236        Subjective: Patient seen and examined at bedside.  Poor historian.  Continues to refuse medical care intermittently including medications as per nursing staff.  Had vomiting yesterday as per nursing staff.  No fever or agitation reported.  Objective: Vitals:   04/10/24 0059 04/10/24 0300 04/10/24 0604 04/10/24 0737  BP:   111/74   Pulse: 99 89 (!) 101 100  Resp:      Temp:   98.2 F (36.8 C)   TempSrc:   Oral   SpO2: 96% 91% 96% 92%  Weight:      Height:        Intake/Output Summary (Last 24 hours) at 04/10/2024 0850 Last data filed at 04/10/2024 0611 Gross per 24 hour  Intake 360 ml  Output 700 ml  Net -340 ml   Filed Weights   03/27/24 1016 03/27/24 1708 03/30/24 1151  Weight: 51.1 kg 42.7 kg 42.7 kg    Examination:  General: Chronically ill and deconditioned looking.  No distress.   ENT/neck: No JVD elevation or palpable neck masses.  Currently on 6 L oxygen by trach respiratory: Decreased breath sounds at bases with scattered crackles CVS: Tachycardic; S1 and S2 heard  abdominal: Soft, nontender, distended mildly; no organomegaly, normal bowel sounds are heard.  PEG tube is present Extremities: Bilateral AKA CNS: Very poor historian.  Remains very slow to respond.  No focal neurologic deficit.  Able to move extremities Lymph: No obvious palpable lymphadenopathy Skin: No obvious petechiae/rashes  psych: Mostly flat affect.  Not agitated currently.    Data Reviewed: I have personally reviewed following labs and imaging studies  CBC: No results for input(s): WBC, NEUTROABS, HGB, HCT, MCV, PLT in the  last 168 hours. Basic Metabolic Panel: No results for input(s): NA, K, CL, CO2, GLUCOSE, BUN, CREATININE, CALCIUM , MG, PHOS in the last 168 hours. GFR: Estimated Creatinine Clearance: 47.4 mL/min (by C-G formula based on SCr of 0.9 mg/dL). Liver Function Tests: No results for input(s): AST, ALT, ALKPHOS, BILITOT, PROT, ALBUMIN  in the last 168 hours. No results for input(s): LIPASE, AMYLASE in the last 168 hours. No results for input(s): AMMONIA in the last 168 hours. Coagulation Profile: No results for input(s): INR, PROTIME in the last 168 hours. Cardiac Enzymes: No results for input(s): CKTOTAL, CKMB, CKMBINDEX, TROPONINI in the last 168 hours. BNP (last 3 results) No results for input(s): PROBNP in the last 8760 hours. HbA1C: No results for input(s): HGBA1C in the last 72 hours. CBG: Recent Labs  Lab 04/08/24 2357 04/09/24 1147 04/09/24 1758 04/10/24 0017 04/10/24 0602  GLUCAP 122* 126* 114* 121* 125*   Lipid Profile: No results for input(s): CHOL, HDL, LDLCALC, TRIG, CHOLHDL, LDLDIRECT in the last 72 hours. Thyroid Function Tests: No results for input(s): TSH, T4TOTAL, FREET4, T3FREE, THYROIDAB in the last 72 hours. Anemia Panel: No results for input(s): VITAMINB12, FOLATE, FERRITIN,  TIBC, IRON, RETICCTPCT in the last 72 hours. Sepsis Labs: No results for input(s): PROCALCITON, LATICACIDVEN in the last 168 hours.  Recent Results (from the past 240 hours)  Expectorated Sputum Assessment w Gram Stain, Rflx to Resp Cult     Status: None   Collection Time: 04/03/24 12:10 PM   Specimen: Expectorated Sputum  Result Value Ref Range Status   Specimen Description EXPECTORATED SPUTUM  Final   Special Requests NONE  Final   Sputum evaluation   Final    THIS SPECIMEN IS ACCEPTABLE FOR SPUTUM CULTURE Performed at [facility], [address], [place], [place] [address]     Report Status 04/03/2024 FINAL  Final  Culture, Respiratory w Gram Stain     Status: None   Collection Time: 04/03/24 12:10 PM  Result Value Ref Range Status   Specimen Description   Final    EXPECTORATED SPUTUM Performed at [facility], [address] [place], [place] [address]    Special Requests   Final    NONE Reflexed from [id number] Performed at [facility], [address] [place], [place] [address]    Gram Stain   Final    MODERATE SQUAMOUS EPITHELIAL CELLS PRESENT FEW WBC PRESENT, PREDOMINANTLY PMN ABUNDANT GRAM NEGATIVE RODS MODERATE GRAM POSITIVE COCCI FEW GRAM POSITIVE RODS Performed at [facility], [address], [place], [place] [address]    Culture   Final    ABUNDANT PROVIDENCIA STUARTII ABUNDANT KLEBSIELLA PNEUMONIAE Two isolates with different morphologies were identified as the same organism.The most resistant organism was reported. MOST RESISTANT ORGANISM REPORTED FOR BOTH ORGANISM 1 P. STUARTII AND 2 K. PNEUMONIAE ABUNDANT PROTEUS MIRABILIS    Report Status 04/08/2024 FINAL  Final   Organism ID, Bacteria PROVIDENCIA STUARTII  Final   Organism ID, Bacteria KLEBSIELLA PNEUMONIAE  Final   Organism ID, Bacteria PROTEUS MIRABILIS  Final      Susceptibility   Klebsiella pneumoniae - MIC*    AMPICILLIN >=32 RESISTANT Resistant     CEFAZOLIN  (NON-URINE) 4 INTERMEDIATE Intermediate     CEFEPIME <=0.12 SENSITIVE Sensitive     ERTAPENEM <=0.12 SENSITIVE Sensitive     CEFTRIAXONE <=0.25 SENSITIVE Sensitive     CIPROFLOXACIN <=0.06 SENSITIVE Sensitive     GENTAMICIN <=1 SENSITIVE Sensitive     MEROPENEM <=0.25 SENSITIVE Sensitive     TRIMETH /SULFA  <=20 SENSITIVE Sensitive     AMPICILLIN/SULBACTAM 8 SENSITIVE Sensitive     PIP/TAZO Value in next row Sensitive      8 SENSITIVEThis is a modified FDA-approved test that has been validated and its performance characteristics determined by the reporting laboratory.  This  laboratory is certified under the Clinical Laboratory Improvement Amendments CLIA as qualified to perform high complexity clinical laboratory testing.    * ABUNDANT KLEBSIELLA PNEUMONIAE   Proteus mirabilis - MIC*    AMPICILLIN Value in next row Resistant      8 SENSITIVEThis is a modified FDA-approved test that has been validated and its performance characteristics determined by the reporting laboratory.  This laboratory is certified under the Clinical Laboratory Improvement Amendments CLIA as qualified to perform high complexity clinical laboratory testing.    CEFAZOLIN  (NON-URINE) Value in next row Intermediate      8 SENSITIVEThis is a modified FDA-approved test that has been validated and its performance characteristics determined by the reporting laboratory.  This laboratory is certified under the Clinical Laboratory Improvement Amendments CLIA as qualified to perform high complexity clinical laboratory  testing.    CEFEPIME Value in next row Sensitive      8 SENSITIVEThis is a modified FDA-approved test that has been validated and its performance characteristics determined by the reporting laboratory.  This laboratory is certified under the Clinical Laboratory Improvement Amendments CLIA as qualified to perform high complexity clinical laboratory testing.    ERTAPENEM Value in next row Sensitive      8 SENSITIVEThis is a modified FDA-approved test that has been validated and its performance characteristics determined by the reporting laboratory.  This laboratory is certified under the Clinical Laboratory Improvement Amendments CLIA as qualified to perform high complexity clinical laboratory testing.    CEFTRIAXONE Value in next row Sensitive      8 SENSITIVEThis is a modified FDA-approved test that has been validated and its performance characteristics determined by the reporting laboratory.  This laboratory is certified under the Clinical Laboratory Improvement Amendments CLIA as qualified to  perform high complexity clinical laboratory testing.    CIPROFLOXACIN Value in next row Resistant      8 SENSITIVEThis is a modified FDA-approved test that has been validated and its performance characteristics determined by the reporting laboratory.  This laboratory is certified under the Clinical Laboratory Improvement Amendments CLIA as qualified to perform high complexity clinical laboratory testing.    GENTAMICIN Value in next row Sensitive      8 SENSITIVEThis is a modified FDA-approved test that has been validated and its performance characteristics determined by the reporting laboratory.  This laboratory is certified under the Clinical Laboratory Improvement Amendments CLIA as qualified to perform high complexity clinical laboratory testing.    MEROPENEM Value in next row Sensitive      8 SENSITIVEThis is a modified FDA-approved test that has been validated and its performance characteristics determined by the reporting laboratory.  This laboratory is certified under the Clinical Laboratory Improvement Amendments CLIA as qualified to perform high complexity clinical laboratory testing.    TRIMETH /SULFA  Value in next row Resistant      8 SENSITIVEThis is a modified FDA-approved test that has been validated and its performance characteristics determined by the reporting laboratory.  This laboratory is certified under the Clinical Laboratory Improvement Amendments CLIA as qualified to perform high complexity clinical laboratory testing.    AMPICILLIN/SULBACTAM Value in next row Sensitive      8 SENSITIVEThis is a modified FDA-approved test that has been validated and its performance characteristics determined by the reporting laboratory.  This laboratory is certified under the Clinical Laboratory Improvement Amendments CLIA as qualified to perform high complexity clinical laboratory testing.    PIP/TAZO Value in next row Sensitive      <=4 SENSITIVEThis is a modified FDA-approved test that has been  validated and its performance characteristics determined by the reporting laboratory.  This laboratory is certified under the Clinical Laboratory Improvement Amendments CLIA as qualified to perform high complexity clinical laboratory testing.    * ABUNDANT PROTEUS MIRABILIS   Providencia stuartii - MIC*    AMPICILLIN Value in next row Resistant      <=4 SENSITIVEThis is a modified FDA-approved test that has been validated and its performance characteristics determined by the reporting laboratory.  This laboratory is certified under the Clinical Laboratory Improvement Amendments CLIA as qualified to perform high complexity clinical laboratory testing.    CEFEPIME Value in next row Sensitive      <=4 SENSITIVEThis is a modified FDA-approved test that has been validated and its performance characteristics determined by the reporting laboratory.  This laboratory is certified under the Clinical Laboratory Improvement Amendments CLIA as qualified to perform high complexity clinical laboratory testing.    ERTAPENEM Value in next row Intermediate      <=4 SENSITIVEThis is a modified FDA-approved test that has been validated and its performance characteristics determined by the reporting laboratory.  This laboratory is certified under the Clinical Laboratory Improvement Amendments CLIA as qualified to perform high complexity clinical laboratory testing.    CEFTRIAXONE Value in next row Sensitive      <=4 SENSITIVEThis is a modified FDA-approved test that has been validated and its performance characteristics determined by the reporting laboratory.  This laboratory is certified under the Clinical Laboratory Improvement Amendments CLIA as qualified to perform high complexity clinical laboratory testing.    CIPROFLOXACIN Value in next row Resistant      <=4 SENSITIVEThis is a modified FDA-approved test that has been validated and its performance characteristics determined by the reporting laboratory.  This  laboratory is certified under the Clinical Laboratory Improvement Amendments CLIA as qualified to perform high complexity clinical laboratory testing.    GENTAMICIN Value in next row Resistant      <=4 SENSITIVEThis is a modified FDA-approved test that has been validated and its performance characteristics determined by the reporting laboratory.  This laboratory is certified under the Clinical Laboratory Improvement Amendments CLIA as qualified to perform high complexity clinical laboratory testing.    MEROPENEM Value in next row Resistant      <=4 SENSITIVEThis is a modified FDA-approved test that has been validated and its performance characteristics determined by the reporting laboratory.  This laboratory is certified under the Clinical Laboratory Improvement Amendments CLIA as qualified to perform high complexity clinical laboratory testing.    TRIMETH /SULFA  Value in next row Resistant      <=4 SENSITIVEThis is a modified FDA-approved test that has been validated and its performance characteristics determined by the reporting laboratory.  This laboratory is certified under the Clinical Laboratory Improvement Amendments CLIA as qualified to perform high complexity clinical laboratory testing.    AMPICILLIN/SULBACTAM Value in next row Resistant      <=4 SENSITIVEThis is a modified FDA-approved test that has been validated and its performance characteristics determined by the reporting laboratory.  This laboratory is certified under the Clinical Laboratory Improvement Amendments CLIA as qualified to perform high complexity clinical laboratory testing.    PIP/TAZO Value in next row Sensitive      <=4 SENSITIVEThis is a modified FDA-approved test that has been validated and its performance characteristics determined by the reporting laboratory.  This laboratory is certified under the Clinical Laboratory Improvement Amendments CLIA as qualified to perform high complexity clinical laboratory testing.    *  ABUNDANT PROVIDENCIA STUARTII         Radiology Studies: No results found.      Scheduled Meds:  aspirin   81 mg Per Tube Daily   atorvastatin   40 mg Per Tube q1800   diclofenac Sodium  2 g Topical QID   docusate  100 mg Per Tube BID   famotidine  20 mg Per Tube BID   free water  100 mL Per Tube Q6H   glycopyrrolate   0.2 mg Intravenous TID   heparin  injection (subcutaneous)  5,000 Units Subcutaneous Q8H   insulin  aspart  0-9 Units Subcutaneous Q6H   ipratropium  0.5 mg Nebulization QID   levETIRAcetam  500 mg Per Tube BID   methocarbamol   500 mg Per Tube Q8H   metoprolol  tartrate  37.5 mg Per Tube BID   mouth rinse  15 mL Mouth Rinse 4 times per day   polyethylene glycol  17 g Per Tube Daily   scopolamine  1 patch Transdermal Q72H   Continuous Infusions:  cefTRIAXone (ROCEPHIN)  IV     feeding supplement (OSMOLITE 1.2 CAL) 1,000 mL (04/09/24 1908)          Sophie Mao, MD [facility] 04/10/2024, 8:50 AM

## 2024-04-10 NOTE — Progress Notes (Signed)
   04/09/24 2043  Therapy Vitals  Pulse Rate (!) 134 (pt coughing)  Patient Position (if appropriate) Lying  MEWS Score/Color  MEWS Score 2  MEWS Score Color Yellow  Respiratory Assessment  Assessment Type Assess only  Respiratory Pattern Regular;Unlabored  Chest Assessment Chest expansion symmetrical  Cough Productive  Sputum Amount Small  Sputum Color Yellow  Sputum Consistency Thick  Sputum Specimen Source Spontaneous cough  Bilateral Breath Sounds  (Pt not letting RT listen to lung sounds)  Oxygen Therapy/Pulse Ox  SpO2 92 %  O2 Device Tracheostomy Collar  O2 Therapy Oxygen humidified  O2 Flow Rate (L/min) 6 L/min  FiO2 (%) 28 %  Tracheostomy Shiley Flexible 6 mm Cuffed  Placement Date/Time: 03/27/24 1424   Placed By: (c) Other (Comment)  Brand: Shiley Flexible  Size (mm): 6 mm  Style: Cuffed  Status Secured with trach ties;Passy Muir Speaking valve  Site Assessment Crusty;Oozing secretions  Site Care Other (Comment) (refused)  Inner Cannula Care  (Pt refused)  Ties Assessment Clean, Dry  Tracheostomy Equipment at bedside Yes and checklist posted at head of bed    Pt refused care, RT explained that we are just hear to help. Pt very angry.

## 2024-04-10 NOTE — Progress Notes (Signed)
 Pt refused to let us  check his CBG.

## 2024-04-10 NOTE — Progress Notes (Signed)
 OT Cancellation Note  Patient Details Name: HOOPER PETTEWAY MRN: [id number] DOB: 09/07/1955   Cancelled Treatment:    Reason Eval/Treat Not Completed: Fatigue/lethargy limiting ability to participate  Third attempted initial OT evaluation. Patient adamantly declined OT services. Will sign off. Please re-consult should patient decision alter.   Raliyah Montella OT/L Acute Rehabilitation Department  [phone]    04/10/2024, 10:37 AM

## 2024-04-10 NOTE — Plan of Care (Signed)
   Problem: Education: Goal: Knowledge of General Education information will improve Description: Including pain rating scale, medication(s)/side effects and non-pharmacologic comfort measures Outcome: Progressing   Problem: Clinical Measurements: Goal: Ability to maintain clinical measurements within normal limits will improve Outcome: Progressing

## 2024-04-11 DIAGNOSIS — G40909 Epilepsy, unspecified, not intractable, without status epilepticus: Secondary | ICD-10-CM | POA: Diagnosis not present

## 2024-04-11 DIAGNOSIS — Z930 Tracheostomy status: Secondary | ICD-10-CM | POA: Diagnosis not present

## 2024-04-11 DIAGNOSIS — C140 Malignant neoplasm of pharynx, unspecified: Secondary | ICD-10-CM | POA: Diagnosis not present

## 2024-04-11 DIAGNOSIS — J392 Other diseases of pharynx: Secondary | ICD-10-CM | POA: Diagnosis not present

## 2024-04-11 LAB — GLUCOSE, CAPILLARY
Glucose-Capillary: 105 mg/dL — ABNORMAL HIGH (ref 70–99)
Glucose-Capillary: 108 mg/dL — ABNORMAL HIGH (ref 70–99)
Glucose-Capillary: 116 mg/dL — ABNORMAL HIGH (ref 70–99)
Glucose-Capillary: 121 mg/dL — ABNORMAL HIGH (ref 70–99)
Glucose-Capillary: 96 mg/dL (ref 70–99)

## 2024-04-11 NOTE — Plan of Care (Signed)
  Problem: Skin Integrity: Goal: Risk for impaired skin integrity will decrease Outcome: Progressing   Problem: Nutritional: Goal: Maintenance of adequate nutrition will improve Outcome: Progressing   Problem: Metabolic: Goal: Ability to maintain appropriate glucose levels will improve Outcome: Progressing   Problem: Skin Integrity: Goal: Risk for impaired skin integrity will decrease Outcome: Progressing

## 2024-04-11 NOTE — Progress Notes (Signed)
 RT NOTE:  RT went into pt room for morning rounds and attempted trach care with pt (ex: cleaning trach site, trach ties, inner cannula, suctioning, etc.) and pt refused all care at this time. RT tried to talk to pt about trach care importance to which the patient still refused.

## 2024-04-11 NOTE — Progress Notes (Addendum)
 PROGRESS NOTE    Stephen Bowen  FMW:[id number] DOB: [birth year] DOA: 03/27/2024 PCP: Trinidad Glisson, MD   Brief Narrative:  68 year old man underwent elective biopsy of pharyngeal mass, then required tracheostomy on 03/27/2024, was subsequently admitted by critical care postoperatively, successfully taken off the ventilator.  Underwent gastrostomy tube placement on 03/30/2024.  He was subsequently transferred to the hospitalist service. Then transferred to [facility] for initiation of radiation therapy.  Oncology consulted: Patient not interested in pursuing chemotherapy at this time.  Palliative care following goals for goals of care.  Patient has been intermittently refusing care including radiation.  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  Underwent gastrostomy tube placement on 03/30/2024.  He was subsequently transferred to the hospitalist service. Then transferred to [facility] for initiation of radiation therapy. 7 weeks of radiation therapy planned.  Oncology consulted: Patient not interested in pursuing chemotherapy at this time.  Palliative care following goals for goals of care.  Patient has been intermittently refusing care including radiation.  If patient continues to refuse care, might benefit from hospice/comfort measures -Continue trach care.  Currently on 6 L oxygen via trach - Currently on Rocephin for providencia and Klebsiella and trach secretions.  Continue Rocephin for at least 7 days - Patient continues to refuse care intermittently including medications.  He apparently has requested that his sister make decision  regarding radiation treatment.  Apparently, the sister wants him to receive radiation treatments.  Radiation oncology team aware: Will try and attempt radiation again next week.  Not sure what radiation alone will help in his current condition.  Failure to thrive  Dysphagia Severe malnutrition -Continue PEG tube feeding  PAD CAD Past medical history of CVA Status post bilateral AKA - Continue aspirin , statin if patient agrees  Hypertension Hyperlipidemia -Continue statin and metoprolol  if patient agrees  History of seizure disorder Possible dementia - Continue Keppra if patient agrees.  Delirium precautions.  Fall precautions  Diabetes mellitus type 2 - A1c 5.9.  Continue CBGs with SSI    DVT prophylaxis: Heparin  subcutaneous Code Status: DNR Family Communication: None at bedside Disposition Plan: Status is: Inpatient Remains inpatient appropriate because: Of severity of illness    Consultants: ENT/critical CARE/oncology/radiation oncology/palliative care  Procedures: As above  Antimicrobials:  Anti-infectives (From admission, onward)    Start     Dose/Rate Route Frequency Ordered Stop   04/09/24 1600  cefTRIAXone (ROCEPHIN) 1 g in sodium chloride  0.9 % 100 mL IVPB        1 g 200 mL/hr over 30 Minutes Intravenous Every 24 hours 04/09/24 1456     04/07/24 1500  cefTRIAXone (ROCEPHIN) injection 1 g  Status:  Discontinued        1 g Intramuscular Every 24 hours 04/07/24 1256 04/09/24 1455   04/07/24 1200  cefTRIAXone (ROCEPHIN) 1 g in sodium chloride  0.9 % 100 mL IVPB  Status:  Discontinued        1 g 200 mL/hr over 30 Minutes Intravenous Every 24 hours 04/07/24 1102 04/07/24 1256   04/06/24 1700  cefTRIAXone (ROCEPHIN) injection 1 g  Status:  Discontinued        1 g Intramuscular Every 24 hours 04/06/24 1536 04/07/24 1102   04/06/24 1500  cefTRIAXone (ROCEPHIN) 1 g in sodium chloride  0.9 % 100 mL IVPB  Status:  Discontinued        1 g 200 mL/hr over 30 Minutes  Intravenous Every 24 hours 04/06/24 1444 04/06/24 1536   03/30/24 1216  ceFAZolin  (ANCEF ) 2-4 GM/100ML-% IVPB       Note to Pharmacy: [name] S: cabinet override      03/30/24 1216 03/30/24 1248   03/30/24 1215  ceFAZolin  (ANCEF ) IVPB 2g/100 mL premix        2 g 200 mL/hr over 30 Minutes Intravenous  Once 03/30/24 1115 03/30/24 1236        Subjective: Patient seen and examined at bedside.  Poor historian.  Patient still refusing medical care intermittently including medications.  No seizures, agitation or vomiting noted.   Objective: Vitals:   04/10/24 2120 04/10/24 2317 04/11/24 0536 04/11/24 0550  BP: 113/78   111/67  Pulse: (!) 101 (!) 116  96  Resp: 18 17 17 18   Temp: 98.2 F (36.8 C)   98.2 F (36.8 C)  TempSrc: Oral   Oral  SpO2: 100% 95%  98%  Weight:      Height:        Intake/Output Summary (Last 24 hours) at 04/11/2024 0909 Last data filed at 04/11/2024 0530 Gross per 24 hour  Intake 1825.42 ml  Output --  Net 1825.42 ml   Filed Weights   03/27/24 1016 03/27/24 1708 03/30/24 1151  Weight: 51.1 kg 42.7 kg 42.7 kg    Examination:  General: No acute distress.  Looks chronically deconditioned. ENT/neck: No palpable thyromegaly or elevated JVD noted.  Currently on 6 L oxygen by trach respiratory: Bilateral decreased breath sounds at bases with some crackles CVS: S1 and S2 heard; tachycardic intermittently  abdominal: Soft, nontender, distended slightly; no organomegaly, bowel sounds are normally heard.  PEG tube is currently present Extremities: Has bilateral AKA  CNS: Extremely poor historian.  Very slow to respond.  No obvious focal deficits lymph: No lymphadenopathy palpable  skin: No obvious ecchymosis/lesions psych: Flat affect mostly.  Currently not agitated    Data Reviewed: I have personally reviewed following labs and imaging studies  CBC: No results for input(s): WBC, NEUTROABS, HGB, HCT, MCV, PLT in the last 168  hours. Basic Metabolic Panel: No results for input(s): NA, K, CL, CO2, GLUCOSE, BUN, CREATININE, CALCIUM , MG, PHOS in the last 168 hours. GFR: Estimated Creatinine Clearance: 47.4 mL/min (by C-G formula based on SCr of 0.9 mg/dL). Liver Function Tests: No results for input(s): AST, ALT, ALKPHOS, BILITOT, PROT, ALBUMIN  in the last 168 hours. No results for input(s): LIPASE, AMYLASE in the last 168 hours. No results for input(s): AMMONIA in the last 168 hours. Coagulation Profile: No results for input(s): INR, PROTIME in the last 168 hours. Cardiac Enzymes: No results for input(s): CKTOTAL, CKMB, CKMBINDEX, TROPONINI in the last 168 hours. BNP (last 3 results) No results for input(s): PROBNP in the last 8760 hours. HbA1C: No results for input(s): HGBA1C in the last 72 hours. CBG: Recent Labs  Lab 04/10/24 0017 04/10/24 0602 04/10/24 1739 04/11/24 0002 04/11/24 0704  GLUCAP 121* 125* 92 116* 121*   Lipid Profile: No results for input(s): CHOL, HDL, LDLCALC, TRIG, CHOLHDL, LDLDIRECT in the last 72 hours. Thyroid Function Tests: No results for input(s): TSH, T4TOTAL, FREET4, T3FREE, THYROIDAB in the last 72 hours. Anemia Panel: No results for input(s): VITAMINB12, FOLATE, FERRITIN, TIBC, IRON, RETICCTPCT in the last 72 hours. Sepsis Labs: No results  for input(s): PROCALCITON, LATICACIDVEN in the last 168 hours.  Recent Results (from the past 240 hours)  Expectorated Sputum Assessment w Gram Stain, Rflx to Resp Cult     Status: None   Collection Time: 04/03/24 12:10 PM   Specimen: Expectorated Sputum  Result Value Ref Range Status   Specimen Description EXPECTORATED SPUTUM  Final   Special Requests NONE  Final   Sputum evaluation   Final    THIS SPECIMEN IS ACCEPTABLE FOR SPUTUM CULTURE Performed at [facility], [address], [place], [place] [address]    Report  Status 04/03/2024 FINAL  Final  Culture, Respiratory w Gram Stain     Status: None   Collection Time: 04/03/24 12:10 PM  Result Value Ref Range Status   Specimen Description   Final    EXPECTORATED SPUTUM Performed at [facility], [address] [place], [place] [address]    Special Requests   Final    NONE Reflexed from [id number] Performed at [facility], [address], [place], [place] [address]    Gram Stain   Final    MODERATE SQUAMOUS EPITHELIAL CELLS PRESENT FEW WBC PRESENT, PREDOMINANTLY PMN ABUNDANT GRAM NEGATIVE RODS MODERATE GRAM POSITIVE COCCI FEW GRAM POSITIVE RODS Performed at [facility], [address], [place], [place] [address]    Culture   Final    ABUNDANT PROVIDENCIA STUARTII ABUNDANT KLEBSIELLA PNEUMONIAE Two isolates with different morphologies were identified as the same organism.The most resistant organism was reported. MOST RESISTANT ORGANISM REPORTED FOR BOTH ORGANISM 1 P. STUARTII AND 2 K. PNEUMONIAE ABUNDANT PROTEUS MIRABILIS    Report Status 04/08/2024 FINAL  Final   Organism ID, Bacteria PROVIDENCIA STUARTII  Final   Organism ID, Bacteria KLEBSIELLA PNEUMONIAE  Final   Organism ID, Bacteria PROTEUS MIRABILIS  Final      Susceptibility   Klebsiella pneumoniae - MIC*    AMPICILLIN >=32 RESISTANT Resistant     CEFAZOLIN  (NON-URINE) 4 INTERMEDIATE Intermediate     CEFEPIME <=0.12 SENSITIVE Sensitive     ERTAPENEM <=0.12 SENSITIVE Sensitive     CEFTRIAXONE <=0.25 SENSITIVE Sensitive     CIPROFLOXACIN <=0.06 SENSITIVE Sensitive     GENTAMICIN <=1 SENSITIVE Sensitive     MEROPENEM <=0.25 SENSITIVE Sensitive     TRIMETH /SULFA  <=20 SENSITIVE Sensitive     AMPICILLIN/SULBACTAM 8 SENSITIVE Sensitive     PIP/TAZO Value in next row Sensitive      8 SENSITIVEThis is a modified FDA-approved test that has been validated and its performance characteristics determined by the reporting laboratory.  This laboratory is  certified under the Clinical Laboratory Improvement Amendments CLIA as qualified to perform high complexity clinical laboratory testing.    * ABUNDANT KLEBSIELLA PNEUMONIAE   Proteus mirabilis - MIC*    AMPICILLIN Value in next row Resistant      8 SENSITIVEThis is a modified FDA-approved test that has been validated and its performance characteristics determined by the reporting laboratory.  This laboratory is certified under the Clinical Laboratory Improvement Amendments CLIA as qualified to perform high complexity clinical laboratory testing.    CEFAZOLIN  (NON-URINE) Value in next row Intermediate      8 SENSITIVEThis is a modified FDA-approved test that has been validated and its performance characteristics determined by the reporting laboratory.  This laboratory is certified under the Clinical Laboratory Improvement Amendments CLIA as qualified to perform high complexity clinical laboratory testing.    CEFEPIME Value in next row Sensitive  8 SENSITIVEThis is a modified FDA-approved test that has been validated and its performance characteristics determined by the reporting laboratory.  This laboratory is certified under the Clinical Laboratory Improvement Amendments CLIA as qualified to perform high complexity clinical laboratory testing.    ERTAPENEM Value in next row Sensitive      8 SENSITIVEThis is a modified FDA-approved test that has been validated and its performance characteristics determined by the reporting laboratory.  This laboratory is certified under the Clinical Laboratory Improvement Amendments CLIA as qualified to perform high complexity clinical laboratory testing.    CEFTRIAXONE Value in next row Sensitive      8 SENSITIVEThis is a modified FDA-approved test that has been validated and its performance characteristics determined by the reporting laboratory.  This laboratory is certified under the Clinical Laboratory Improvement Amendments CLIA as qualified to perform high  complexity clinical laboratory testing.    CIPROFLOXACIN Value in next row Resistant      8 SENSITIVEThis is a modified FDA-approved test that has been validated and its performance characteristics determined by the reporting laboratory.  This laboratory is certified under the Clinical Laboratory Improvement Amendments CLIA as qualified to perform high complexity clinical laboratory testing.    GENTAMICIN Value in next row Sensitive      8 SENSITIVEThis is a modified FDA-approved test that has been validated and its performance characteristics determined by the reporting laboratory.  This laboratory is certified under the Clinical Laboratory Improvement Amendments CLIA as qualified to perform high complexity clinical laboratory testing.    MEROPENEM Value in next row Sensitive      8 SENSITIVEThis is a modified FDA-approved test that has been validated and its performance characteristics determined by the reporting laboratory.  This laboratory is certified under the Clinical Laboratory Improvement Amendments CLIA as qualified to perform high complexity clinical laboratory testing.    TRIMETH /SULFA  Value in next row Resistant      8 SENSITIVEThis is a modified FDA-approved test that has been validated and its performance characteristics determined by the reporting laboratory.  This laboratory is certified under the Clinical Laboratory Improvement Amendments CLIA as qualified to perform high complexity clinical laboratory testing.    AMPICILLIN/SULBACTAM Value in next row Sensitive      8 SENSITIVEThis is a modified FDA-approved test that has been validated and its performance characteristics determined by the reporting laboratory.  This laboratory is certified under the Clinical Laboratory Improvement Amendments CLIA as qualified to perform high complexity clinical laboratory testing.    PIP/TAZO Value in next row Sensitive      <=4 SENSITIVEThis is a modified FDA-approved test that has been validated and  its performance characteristics determined by the reporting laboratory.  This laboratory is certified under the Clinical Laboratory Improvement Amendments CLIA as qualified to perform high complexity clinical laboratory testing.    * ABUNDANT PROTEUS MIRABILIS   Providencia stuartii - MIC*    AMPICILLIN Value in next row Resistant      <=4 SENSITIVEThis is a modified FDA-approved test that has been validated and its performance characteristics determined by the reporting laboratory.  This laboratory is certified under the Clinical Laboratory Improvement Amendments CLIA as qualified to perform high complexity clinical laboratory testing.    CEFEPIME Value in next row Sensitive      <=4 SENSITIVEThis is a modified FDA-approved test that has been validated and its performance characteristics determined by the reporting laboratory.  This laboratory is certified under the Clinical Laboratory Improvement Amendments CLIA as qualified to  perform high complexity clinical laboratory testing.    ERTAPENEM Value in next row Intermediate      <=4 SENSITIVEThis is a modified FDA-approved test that has been validated and its performance characteristics determined by the reporting laboratory.  This laboratory is certified under the Clinical Laboratory Improvement Amendments CLIA as qualified to perform high complexity clinical laboratory testing.    CEFTRIAXONE Value in next row Sensitive      <=4 SENSITIVEThis is a modified FDA-approved test that has been validated and its performance characteristics determined by the reporting laboratory.  This laboratory is certified under the Clinical Laboratory Improvement Amendments CLIA as qualified to perform high complexity clinical laboratory testing.    CIPROFLOXACIN Value in next row Resistant      <=4 SENSITIVEThis is a modified FDA-approved test that has been validated and its performance characteristics determined by the reporting laboratory.  This laboratory is certified  under the Clinical Laboratory Improvement Amendments CLIA as qualified to perform high complexity clinical laboratory testing.    GENTAMICIN Value in next row Resistant      <=4 SENSITIVEThis is a modified FDA-approved test that has been validated and its performance characteristics determined by the reporting laboratory.  This laboratory is certified under the Clinical Laboratory Improvement Amendments CLIA as qualified to perform high complexity clinical laboratory testing.    MEROPENEM Value in next row Resistant      <=4 SENSITIVEThis is a modified FDA-approved test that has been validated and its performance characteristics determined by the reporting laboratory.  This laboratory is certified under the Clinical Laboratory Improvement Amendments CLIA as qualified to perform high complexity clinical laboratory testing.    TRIMETH /SULFA  Value in next row Resistant      <=4 SENSITIVEThis is a modified FDA-approved test that has been validated and its performance characteristics determined by the reporting laboratory.  This laboratory is certified under the Clinical Laboratory Improvement Amendments CLIA as qualified to perform high complexity clinical laboratory testing.    AMPICILLIN/SULBACTAM Value in next row Resistant      <=4 SENSITIVEThis is a modified FDA-approved test that has been validated and its performance characteristics determined by the reporting laboratory.  This laboratory is certified under the Clinical Laboratory Improvement Amendments CLIA as qualified to perform high complexity clinical laboratory testing.    PIP/TAZO Value in next row Sensitive      <=4 SENSITIVEThis is a modified FDA-approved test that has been validated and its performance characteristics determined by the reporting laboratory.  This laboratory is certified under the Clinical Laboratory Improvement Amendments CLIA as qualified to perform high complexity clinical laboratory testing.    * ABUNDANT PROVIDENCIA  STUARTII         Radiology Studies: No results found.      Scheduled Meds:  aspirin   81 mg Per Tube Daily   atorvastatin   40 mg Per Tube q1800   diclofenac Sodium  2 g Topical QID   docusate  100 mg Per Tube BID   famotidine  20 mg Per Tube BID   free water  100 mL Per Tube Q6H   glycopyrrolate   0.2 mg Intravenous TID   heparin  injection (subcutaneous)  5,000 Units Subcutaneous Q8H   insulin  aspart  0-9 Units Subcutaneous Q6H   levETIRAcetam  500 mg Per Tube BID   methocarbamol   500 mg Per Tube Q8H   metoprolol  tartrate  37.5 mg Per Tube BID   mouth rinse  15 mL Mouth Rinse 4 times per day   polyethylene  glycol  17 g Per Tube Daily   scopolamine  1 patch Transdermal Q72H   Continuous Infusions:  cefTRIAXone (ROCEPHIN)  IV 200 mL/hr at 04/10/24 1546   feeding supplement (OSMOLITE 1.2 CAL) 55 mL/hr at 04/10/24 1546          Gwendolyn Mclees Cheryle, MD [facility] 04/11/2024, 9:09 AM

## 2024-04-11 NOTE — Plan of Care (Signed)
   Problem: Clinical Measurements: Goal: Ability to maintain clinical measurements within normal limits will improve Outcome: Progressing   Problem: Coping: Goal: Level of anxiety will decrease Outcome: Progressing

## 2024-04-11 NOTE — Progress Notes (Signed)
 Daily Progress Note   Patient Name: Stephen Bowen       Date: 04/11/2024 DOB: May 02, 1956  Age: 68 y.o. MRN#: [id number] Attending Physician: Cheryle Page, MD Primary Care Physician: Trinidad Glisson, MD Admit Date: 03/27/2024 Length of Stay: 15 days  Reason for Consultation/Follow-up: Establishing goals of care  Subjective:  Subjective:  Patient refused trach care this am.  Now on Na Bicarb solution TID swish and spit for mouth pain - medication history checked, does not look like patient has taken this since ordered on 04-10-24.  Resting in bed with eyes closed, has a lot of secretions around his trach site.    Objective:   Vital Signs:  BP 111/67 (BP Location: Right Arm)   Pulse 96   Temp 98.2 F (36.8 C) (Oral)   Resp 18   Ht 5' 6 (1.676 m)   Wt 42.7 kg   SpO2 98%   BMI 15.19 kg/m   Physical Exam: General: NAD, cachectic, frail, chronically ill-appearing, pooled secretions running down trach site.  tracheostomy in place with trach collar on 5 L/min 28% FiO2 Extremities: Bilateral BKAs Neuro: asleep, doesn't engage much.   Assessment & Plan:   Assessment: Patient is a 68 year old male with a past medical history of squamous cell carcinoma of the pharynx status post tracheostomy, failure to thrive  status post PEG tube placement, severe protein calorie malnutrition, PAD, CAD, history of CVA, history of tobacco use, history of alcohol use, diabetes mellitus type 2, seizure disorder, dementia, bilateral BKA and long-term care resident [facility] in [place] who was admitted on 03/27/2024 after undergoing elective pharyngeal mass biopsy that unfortunately began to ooze requiring cauterization and due to the risk of recurrent bleeding after extubation, decision was made to proceed with tracheostomy placement.  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  Oncology and radiation oncology consulted for recommendations and  evaluation.  Palliative medicine team consulted to assist with complex medical decision making.  Recommendations/Plan: # Complex medical decision making/goals of care:  10-16 - Attempted to discuss care with patient as detailed above in HPI.  Patient appears visibly frustrated with situation and though has been refusing care, mouths I do not know about stopping radiation to transition to comfort focused care.  Currently patient is planned to receive 7 weeks of radiation.  Initially nursing facility had noted would not be able to transport back and forth so patient would remain hospitalized for this.  Hospitalist and TOC going to follow-up on this.   Will reiterate that if patient continues to refuse interventions such as radiation when offered, would be appropriate to focus on comfort and get hospice involved moving forward.  Had informed sister, Wendy/reported HCPOA, about this during discussion on 04/09/2024.  At present, she is not on board with comfort care.  Palliative medicine team will continue to engage in conversations as able and appropriate moving forward                -  Code Status: Limited: Do not attempt resuscitation (DNR) -DNR-LIMITED -Do Not Intubate/DNI  - Outpatient palliative following with rehab     # Pain, mouth pain:     - Oxycodone  5 mg per tube PRN Sodium bicarbonate/sodium chloride  mouthwash 1000 mL Route: Mouth rinse Dose/Frequency: Swish and spit 15 mL three times daily (TID) as needed for mouth pain Indication: Oral discomfort in head and neck cancer; no evidence of thrush or mucositis # Secretions  Remains on scheduled anti  secretion IV medications.   - Robinul  0.2 mg IV TID   # Psycho-social/Spiritual Support:  - Support System: Sister-Wendy Litz noted she is patient's Product manager and POA   # Discharge Planning: Skilled nursing facility with rehab and palliative to follow outpatient.   Discussed with: Attempted to discuss with patient IDT discussions with  nursing colleagues via secure chat.     Mod MDM  Thank you for allowing the palliative care team to participate in the care [name].   [name], MD Palliative Care Provider PMT # [phone]  If patient remains symptomatic despite maximum doses, please call PMT at [phone] between 0700 and 1900. Outside of these hours, please call attending, as PMT does not have night coverage.

## 2024-04-12 DIAGNOSIS — J392 Other diseases of pharynx: Secondary | ICD-10-CM | POA: Diagnosis not present

## 2024-04-12 LAB — GLUCOSE, CAPILLARY
Glucose-Capillary: 126 mg/dL — ABNORMAL HIGH (ref 70–99)
Glucose-Capillary: 93 mg/dL (ref 70–99)
Glucose-Capillary: 106 mg/dL — ABNORMAL HIGH (ref 70–99)

## 2024-04-12 MED ORDER — LORAZEPAM 2 MG/ML IJ SOLN: 0.5000 mg | Freq: Once | INTRAMUSCULAR | Status: DC

## 2024-04-12 NOTE — Plan of Care (Signed)
  Problem: Education: Goal: Knowledge of General Education information will improve Description: Including pain rating scale, medication(s)/side effects and non-pharmacologic comfort measures Outcome: Progressing   Problem: Clinical Measurements: Goal: Ability to maintain clinical measurements within normal limits will improve Outcome: Progressing Goal: Will remain free from infection Outcome: Progressing Goal: Diagnostic test results will improve Outcome: Progressing   Problem: Health Behavior/Discharge Planning: Goal: Ability to manage health-related needs will improve Outcome: Not Progressing

## 2024-04-12 NOTE — Progress Notes (Addendum)
 Lavanda Horns, NP, CN, and RT at bedside patient agreed to have inner cannula replaced; Inner cannula has been successfully replaced no signs of distress noted. See new orders.

## 2024-04-12 NOTE — Progress Notes (Signed)
 PROGRESS NOTE    DEIONDRE Bowen  FMW:[id number] DOB: 07-09-1955 DOA: 03/27/2024 PCP: Trinidad Glisson, MD   Brief Narrative:  68 year old man underwent elective biopsy of pharyngeal mass, then required tracheostomy on 03/27/2024, was subsequently admitted by critical care postoperatively, successfully taken off the ventilator.  Underwent gastrostomy tube placement on 03/30/2024.  He was subsequently transferred to the hospitalist service. Then transferred to [facility] for initiation of radiation therapy.  Oncology consulted: Patient not interested in pursuing chemotherapy at this time.  Palliative care following goals for goals of care.  Patient has been intermittently refusing care including radiation.  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  Underwent gastrostomy tube placement on 03/30/2024.  He was subsequently transferred to the hospitalist service. Then transferred to [facility] for initiation of radiation therapy. 7 weeks of radiation therapy planned.  Oncology consulted: Patient not interested in pursuing chemotherapy at this time.  Palliative care following goals for goals of care.  Patient has been intermittently refusing care including radiation.  If patient continues to refuse care, might benefit from hospice/comfort measures -Continue trach care.  Currently on 6 L oxygen via trach - Currently on Rocephin for providencia and Klebsiella and trach secretions.  Continue Rocephin for at least 7 days - Patient continues to refuse care intermittently including medications.  He apparently has requested that his sister make decision  regarding radiation treatment.  Apparently, the sister wants him to receive radiation treatments.  Radiation oncology team aware: Will try and attempt radiation again next week.  Not sure what radiation alone will help in his current condition.  Failure to thrive  Dysphagia Severe malnutrition -Continue PEG tube feeding  PAD CAD Past medical history of CVA Status post bilateral AKA - Continue aspirin , statin   Hypertension Hyperlipidemia -Continue statin and metoprolol    History of seizure disorder Possible dementia - Continue Keppra.  Delirium precautions.  Fall precautions  Diabetes mellitus type 2 - A1c 5.9.  Continue CBGs with SSI    DVT prophylaxis: Heparin  subcutaneous Code Status: DNR Family Communication: None at bedside Disposition Plan: Status is: Inpatient Remains inpatient appropriate because: Of severity of illness.  Need for SNF placement    Consultants: ENT/critical CARE/oncology/radiation oncology/palliative care  Procedures: As above  Antimicrobials:  Anti-infectives (From admission, onward)    Start     Dose/Rate Route Frequency Ordered Stop   04/09/24 1600  cefTRIAXone (ROCEPHIN) 1 g in sodium chloride  0.9 % 100 mL IVPB        1 g 200 mL/hr over 30 Minutes Intravenous Every 24 hours 04/09/24 1456     04/07/24 1500  cefTRIAXone (ROCEPHIN) injection 1 g  Status:  Discontinued        1 g Intramuscular Every 24 hours 04/07/24 1256 04/09/24 1455   04/07/24 1200  cefTRIAXone (ROCEPHIN) 1 g in sodium chloride  0.9 % 100 mL IVPB  Status:  Discontinued        1 g 200 mL/hr over 30 Minutes Intravenous Every 24 hours 04/07/24 1102 04/07/24 1256   04/06/24 1700  cefTRIAXone (ROCEPHIN) injection 1 g  Status:  Discontinued        1 g Intramuscular Every 24 hours 04/06/24 1536 04/07/24 1102   04/06/24 1500  cefTRIAXone (ROCEPHIN)  1 g in sodium chloride  0.9 % 100 mL IVPB  Status:  Discontinued        1 g 200 mL/hr over 30 Minutes Intravenous Every 24 hours  04/06/24 1444 04/06/24 1536   03/30/24 1216  ceFAZolin  (ANCEF ) 2-4 GM/100ML-% IVPB       Note to Pharmacy: [name] S: cabinet override      03/30/24 1216 03/30/24 1248   03/30/24 1215  ceFAZolin  (ANCEF ) IVPB 2g/100 mL premix        2 g 200 mL/hr over 30 Minutes Intravenous  Once 03/30/24 1115 03/30/24 1236        Subjective: Patient seen and examined at bedside.  Poor historian.  No agitation, seizures or vomiting overnight.   Objective: Vitals:   04/11/24 1946 04/11/24 2109 04/11/24 2306 04/12/24 0459  BP:  122/73  112/78  Pulse: 83 90 (!) 103 91  Resp:  18 18 17   Temp:  97.8 F (36.6 C)  97.7 F (36.5 C)  TempSrc:  Oral  Oral  SpO2: 95% 98% 97% 100%  Weight:    45.1 kg  Height:        Intake/Output Summary (Last 24 hours) at 04/12/2024 0842 Last data filed at 04/12/2024 0704 Gross per 24 hour  Intake 300 ml  Output 1100 ml  Net -800 ml   Filed Weights   03/27/24 1708 03/30/24 1151 04/12/24 0459  Weight: 42.7 kg 42.7 kg 45.1 kg    Examination:  General: Chronically ill and deconditioned,.  No distress currently.   ENT/neck: No JVD elevation or palpable neck masses noted.  Currently remains on 6 L oxygen by trach respiratory: Breath sounds at bases bilaterally with scattered crackles  CVS: Intermittently tachycardic; S1 and S2 are heard  abdominal: Soft, nontender, distended mildly; no organomegaly, bowel sounds are heard normally.  PEG tube present  extremities: Bilateral AKA CNS: Still extremely slow to respond and a poor historian.  No focal deficits noted  lymph: No palpable lymphadenopathy noted skin: No obvious petechia/rashes psych: Show no signs of agitation.  Affect is flat.   Data Reviewed: I have personally reviewed following labs and imaging studies  CBC: No results for input(s): WBC, NEUTROABS, HGB, HCT, MCV, PLT in the last 168 hours. Basic Metabolic Panel: No results for input(s): NA, K, CL, CO2, GLUCOSE, BUN,  CREATININE, CALCIUM , MG, PHOS in the last 168 hours. GFR: Estimated Creatinine Clearance: 50.1 mL/min (by C-G formula based on SCr of 0.9 mg/dL). Liver Function Tests: No results for input(s): AST, ALT, ALKPHOS, BILITOT, PROT, ALBUMIN  in the last 168 hours. No results for input(s): LIPASE, AMYLASE in the last 168 hours. No results for input(s): AMMONIA in the last 168 hours. Coagulation Profile: No results for input(s): INR, PROTIME in the last 168 hours. Cardiac Enzymes: No results for input(s): CKTOTAL, CKMB, CKMBINDEX, TROPONINI in the last 168 hours. BNP (last 3 results) No results for input(s): PROBNP in the last 8760 hours. HbA1C: No results for input(s): HGBA1C in the last 72 hours. CBG: Recent Labs  Lab 04/11/24 0704 04/11/24 1132 04/11/24 1801 04/11/24 2349 04/12/24 0631  GLUCAP 121* 105* 108* 96 126*   Lipid Profile: No results for input(s): CHOL, HDL, LDLCALC, TRIG, CHOLHDL, LDLDIRECT in the last 72 hours. Thyroid Function Tests: No results for input(s): TSH, T4TOTAL, FREET4, T3FREE, THYROIDAB in the last 72 hours. Anemia Panel: No results for input(s): VITAMINB12, FOLATE, FERRITIN, TIBC, IRON, RETICCTPCT in the last 72 hours. Sepsis Labs: No results for input(s): PROCALCITON, LATICACIDVEN  in the last 168 hours.  Recent Results (from the past 240 hours)  Expectorated Sputum Assessment w Gram Stain, Rflx to Resp Cult     Status: None   Collection Time: 04/03/24 12:10 PM   Specimen: Expectorated Sputum  Result Value Ref Range Status   Specimen Description EXPECTORATED SPUTUM  Final   Special Requests NONE  Final   Sputum evaluation   Final    THIS SPECIMEN IS ACCEPTABLE FOR SPUTUM CULTURE Performed at [facility], [address], [place], [place] [address]    Report Status 04/03/2024 FINAL  Final  Culture, Respiratory w Gram Stain     Status: None   Collection  Time: 04/03/24 12:10 PM  Result Value Ref Range Status   Specimen Description   Final    EXPECTORATED SPUTUM Performed at [facility], [address], [place], [place] [address]    Special Requests   Final    NONE Reflexed from [id number] Performed at [facility], [address] [place], [place] [address]    Gram Stain   Final    MODERATE SQUAMOUS EPITHELIAL CELLS PRESENT FEW WBC PRESENT, PREDOMINANTLY PMN ABUNDANT GRAM NEGATIVE RODS MODERATE GRAM POSITIVE COCCI FEW GRAM POSITIVE RODS Performed at [facility], [address] [place], [place] [address]    Culture   Final    ABUNDANT PROVIDENCIA STUARTII ABUNDANT KLEBSIELLA PNEUMONIAE Two isolates with different morphologies were identified as the same organism.The most resistant organism was reported. MOST RESISTANT ORGANISM REPORTED FOR BOTH ORGANISM 1 P. STUARTII AND 2 K. PNEUMONIAE ABUNDANT PROTEUS MIRABILIS    Report Status 04/08/2024 FINAL  Final   Organism ID, Bacteria PROVIDENCIA STUARTII  Final   Organism ID, Bacteria KLEBSIELLA PNEUMONIAE  Final   Organism ID, Bacteria PROTEUS MIRABILIS  Final      Susceptibility   Klebsiella pneumoniae - MIC*    AMPICILLIN >=32 RESISTANT Resistant     CEFAZOLIN  (NON-URINE) 4 INTERMEDIATE Intermediate     CEFEPIME <=0.12 SENSITIVE Sensitive     ERTAPENEM <=0.12 SENSITIVE Sensitive     CEFTRIAXONE <=0.25 SENSITIVE Sensitive     CIPROFLOXACIN <=0.06 SENSITIVE Sensitive     GENTAMICIN <=1 SENSITIVE Sensitive     MEROPENEM <=0.25 SENSITIVE Sensitive     TRIMETH /SULFA  <=20 SENSITIVE Sensitive     AMPICILLIN/SULBACTAM 8 SENSITIVE Sensitive     PIP/TAZO Value in next row Sensitive      8 SENSITIVEThis is a modified FDA-approved test that has been validated and its performance characteristics determined by the reporting laboratory.  This laboratory is certified under the Clinical Laboratory Improvement Amendments CLIA as qualified to perform high  complexity clinical laboratory testing.    * ABUNDANT KLEBSIELLA PNEUMONIAE   Proteus mirabilis - MIC*    AMPICILLIN Value in next row Resistant      8 SENSITIVEThis is a modified FDA-approved test that has been validated and its performance characteristics determined by the reporting laboratory.  This laboratory is certified under the Clinical Laboratory Improvement Amendments CLIA as qualified to perform high complexity clinical laboratory testing.    CEFAZOLIN  (NON-URINE) Value in next row Intermediate      8 SENSITIVEThis is a modified FDA-approved test that has been validated and its performance characteristics determined by the reporting laboratory.  This laboratory is certified under the Clinical Laboratory Improvement Amendments CLIA as qualified to perform high complexity clinical laboratory testing.    CEFEPIME Value in next row Sensitive      8  SENSITIVEThis is a modified FDA-approved test that has been validated and its performance characteristics determined by the reporting laboratory.  This laboratory is certified under the Clinical Laboratory Improvement Amendments CLIA as qualified to perform high complexity clinical laboratory testing.    ERTAPENEM Value in next row Sensitive      8 SENSITIVEThis is a modified FDA-approved test that has been validated and its performance characteristics determined by the reporting laboratory.  This laboratory is certified under the Clinical Laboratory Improvement Amendments CLIA as qualified to perform high complexity clinical laboratory testing.    CEFTRIAXONE Value in next row Sensitive      8 SENSITIVEThis is a modified FDA-approved test that has been validated and its performance characteristics determined by the reporting laboratory.  This laboratory is certified under the Clinical Laboratory Improvement Amendments CLIA as qualified to perform high complexity clinical laboratory testing.    CIPROFLOXACIN Value in next row Resistant      8  SENSITIVEThis is a modified FDA-approved test that has been validated and its performance characteristics determined by the reporting laboratory.  This laboratory is certified under the Clinical Laboratory Improvement Amendments CLIA as qualified to perform high complexity clinical laboratory testing.    GENTAMICIN Value in next row Sensitive      8 SENSITIVEThis is a modified FDA-approved test that has been validated and its performance characteristics determined by the reporting laboratory.  This laboratory is certified under the Clinical Laboratory Improvement Amendments CLIA as qualified to perform high complexity clinical laboratory testing.    MEROPENEM Value in next row Sensitive      8 SENSITIVEThis is a modified FDA-approved test that has been validated and its performance characteristics determined by the reporting laboratory.  This laboratory is certified under the Clinical Laboratory Improvement Amendments CLIA as qualified to perform high complexity clinical laboratory testing.    TRIMETH /SULFA  Value in next row Resistant      8 SENSITIVEThis is a modified FDA-approved test that has been validated and its performance characteristics determined by the reporting laboratory.  This laboratory is certified under the Clinical Laboratory Improvement Amendments CLIA as qualified to perform high complexity clinical laboratory testing.    AMPICILLIN/SULBACTAM Value in next row Sensitive      8 SENSITIVEThis is a modified FDA-approved test that has been validated and its performance characteristics determined by the reporting laboratory.  This laboratory is certified under the Clinical Laboratory Improvement Amendments CLIA as qualified to perform high complexity clinical laboratory testing.    PIP/TAZO Value in next row Sensitive      <=4 SENSITIVEThis is a modified FDA-approved test that has been validated and its performance characteristics determined by the reporting laboratory.  This laboratory is  certified under the Clinical Laboratory Improvement Amendments CLIA as qualified to perform high complexity clinical laboratory testing.    * ABUNDANT PROTEUS MIRABILIS   Providencia stuartii - MIC*    AMPICILLIN Value in next row Resistant      <=4 SENSITIVEThis is a modified FDA-approved test that has been validated and its performance characteristics determined by the reporting laboratory.  This laboratory is certified under the Clinical Laboratory Improvement Amendments CLIA as qualified to perform high complexity clinical laboratory testing.    CEFEPIME Value in next row Sensitive      <=4 SENSITIVEThis is a modified FDA-approved test that has been validated and its performance characteristics determined by the reporting laboratory.  This laboratory is certified under the Clinical Laboratory Improvement Amendments CLIA as qualified to perform  high complexity clinical laboratory testing.    ERTAPENEM Value in next row Intermediate      <=4 SENSITIVEThis is a modified FDA-approved test that has been validated and its performance characteristics determined by the reporting laboratory.  This laboratory is certified under the Clinical Laboratory Improvement Amendments CLIA as qualified to perform high complexity clinical laboratory testing.    CEFTRIAXONE Value in next row Sensitive      <=4 SENSITIVEThis is a modified FDA-approved test that has been validated and its performance characteristics determined by the reporting laboratory.  This laboratory is certified under the Clinical Laboratory Improvement Amendments CLIA as qualified to perform high complexity clinical laboratory testing.    CIPROFLOXACIN Value in next row Resistant      <=4 SENSITIVEThis is a modified FDA-approved test that has been validated and its performance characteristics determined by the reporting laboratory.  This laboratory is certified under the Clinical Laboratory Improvement Amendments CLIA as qualified to perform high  complexity clinical laboratory testing.    GENTAMICIN Value in next row Resistant      <=4 SENSITIVEThis is a modified FDA-approved test that has been validated and its performance characteristics determined by the reporting laboratory.  This laboratory is certified under the Clinical Laboratory Improvement Amendments CLIA as qualified to perform high complexity clinical laboratory testing.    MEROPENEM Value in next row Resistant      <=4 SENSITIVEThis is a modified FDA-approved test that has been validated and its performance characteristics determined by the reporting laboratory.  This laboratory is certified under the Clinical Laboratory Improvement Amendments CLIA as qualified to perform high complexity clinical laboratory testing.    TRIMETH /SULFA  Value in next row Resistant      <=4 SENSITIVEThis is a modified FDA-approved test that has been validated and its performance characteristics determined by the reporting laboratory.  This laboratory is certified under the Clinical Laboratory Improvement Amendments CLIA as qualified to perform high complexity clinical laboratory testing.    AMPICILLIN/SULBACTAM Value in next row Resistant      <=4 SENSITIVEThis is a modified FDA-approved test that has been validated and its performance characteristics determined by the reporting laboratory.  This laboratory is certified under the Clinical Laboratory Improvement Amendments CLIA as qualified to perform high complexity clinical laboratory testing.    PIP/TAZO Value in next row Sensitive      <=4 SENSITIVEThis is a modified FDA-approved test that has been validated and its performance characteristics determined by the reporting laboratory.  This laboratory is certified under the Clinical Laboratory Improvement Amendments CLIA as qualified to perform high complexity clinical laboratory testing.    * ABUNDANT PROVIDENCIA STUARTII         Radiology Studies: No results found.      Scheduled Meds:   aspirin   81 mg Per Tube Daily   atorvastatin   40 mg Per Tube q1800   diclofenac Sodium  2 g Topical QID   docusate  100 mg Per Tube BID   famotidine  20 mg Per Tube BID   free water  100 mL Per Tube Q6H   glycopyrrolate   0.2 mg Intravenous TID   heparin  injection (subcutaneous)  5,000 Units Subcutaneous Q8H   insulin  aspart  0-9 Units Subcutaneous Q6H   levETIRAcetam  500 mg Per Tube BID   methocarbamol   500 mg Per Tube Q8H   metoprolol  tartrate  37.5 mg Per Tube BID   mouth rinse  15 mL Mouth Rinse 4 times per day   polyethylene glycol  17 g Per Tube Daily   scopolamine  1 patch Transdermal Q72H   Continuous Infusions:  cefTRIAXone (ROCEPHIN)  IV 1 g (04/11/24 1655)   feeding supplement (OSMOLITE 1.2 CAL) 1,000 mL (04/12/24 9356)          Sophie Mao, MD [facility] 04/12/2024, 8:42 AM

## 2024-04-12 NOTE — Progress Notes (Addendum)
 The patient's tracheotomy inner cannula was noted to have fallen out. The patient was informed of need to replace the cannula to maintain airway patency and prevent complication. However, the patient refused to have the cannula replaced at this time. RT at bedside. Patient continue to refuse inner cannula replacement. No immediate distress noted, will continue close monitoring of respiratory status and any signs of distress. Provider notified

## 2024-04-12 NOTE — Progress Notes (Signed)
 Daily Progress Note   Patient Name: Stephen Bowen       Date: 04/12/2024 DOB: 1956/05/13  Age: 68 y.o. MRN#: [id number] Attending Physician: Cheryle Page, MD Primary Care Physician: Trinidad Glisson, MD Admit Date: 03/27/2024 Length of Stay: 16 days  Reason for Consultation/Follow-up: Establishing goals of care  Subjective:  Subjective:  Patient accepted inner cannula on his trach to be replaced,  1 dose of IV Dilaudid  IV and one dose of Oxy IR PRN via PEG given in the past 24 hours. Awake alert, shrugs when asked how he is doing.   Objective:   Vital Signs:  BP 112/78 (BP Location: Right Arm)   Pulse 91   Temp 97.7 F (36.5 C) (Oral)   Resp 17   Ht 5' 6 (1.676 m)   Wt 45.1 kg   SpO2 100%   BMI 16.05 kg/m   Physical Exam: General: NAD, cachectic, frail, chronically ill-appearing, pooled secretions running down trach site.  tracheostomy in place with trach collar on   Extremities: Bilateral BKAs Neuro: awake doesn't engage much.   Assessment & Plan:   Assessment: Patient is a 68 year old male with a past medical history of squamous cell carcinoma of the pharynx status post tracheostomy, failure to thrive  status post PEG tube placement, severe protein calorie malnutrition, PAD, CAD, history of CVA, history of tobacco use, history of alcohol use, diabetes mellitus type 2, seizure disorder, dementia, bilateral BKA and long-term care resident [facility] in [place] who was admitted on 03/27/2024 after undergoing elective pharyngeal mass biopsy that unfortunately began to ooze requiring cauterization and due to the risk of recurrent bleeding after extubation, decision was made to proceed with tracheostomy placement.  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  Oncology and radiation oncology consulted for recommendations and evaluation.  Palliative medicine team consulted to assist with complex medical decision making.   Recommendations/Plan: # Complex medical decision making/goals of care:  10-16 - Attempted to discuss care with patient as detailed above in HPI.  Patient appears visibly frustrated with situation and though has been refusing care, mouths I do not know about stopping radiation to transition to comfort focused care.  Currently patient is planned to receive 7 weeks of radiation.  Initially nursing facility had noted would not be able to transport back and forth so patient would remain hospitalized for this.  Hospitalist and TOC going to follow-up on this.   Will reiterate that if patient continues to refuse interventions such as radiation when offered, would be appropriate to focus on comfort and get hospice involved moving forward.  Had informed sister, Wendy/reported HCPOA, about this during discussion on 04/09/2024.  At present, she is not on board with comfort care.  Palliative medicine team will continue to engage in conversations as able and appropriate moving forward                -  Code Status: Limited: Do not attempt resuscitation (DNR) -DNR-LIMITED -Do Not Intubate/DNI  - Outpatient palliative following with rehab     # Pain, mouth pain:     - Oxycodone  5 mg per tube PRN Sodium bicarbonate/sodium chloride  mouthwash 1000 mL Route: Mouth rinse Dose/Frequency: Swish and spit 15 mL three times daily (TID) as needed for mouth pain Indication: Oral discomfort in head and neck cancer; no evidence of thrush or mucositis # Secretions  Remains on scheduled anti secretion IV medications.   - Robinul  0.2 mg IV TID   #  Psycho-social/Spiritual Support:  - Support System: Sister-Wendy Arnell noted she is patient's Hotel manager   # Discharge Planning: Skilled nursing facility with rehab and palliative to follow outpatient. Came from [facility], is not opposed to going back there.   Discussed with:   patient IDT discussions with nursing colleagues via secure chat.     Mod MDM  Thank  you for allowing the palliative care team to participate in the care [name].   [name], MD Palliative Care Provider PMT # [phone]  If patient remains symptomatic despite maximum doses, please call PMT at [phone] between 0700 and 1900. Outside of these hours, please call attending, as PMT does not have night coverage.

## 2024-04-13 ENCOUNTER — Ambulatory Visit: Admitting: Radiation Oncology

## 2024-04-13 DIAGNOSIS — J392 Other diseases of pharynx: Secondary | ICD-10-CM | POA: Diagnosis not present

## 2024-04-13 LAB — GLUCOSE, CAPILLARY
Glucose-Capillary: 103 mg/dL — ABNORMAL HIGH (ref 70–99)
Glucose-Capillary: 87 mg/dL (ref 70–99)
Glucose-Capillary: 97 mg/dL (ref 70–99)

## 2024-04-13 NOTE — [place] FL2 (Signed)
 [place]  MEDICAID FL2 LEVEL OF CARE FORM     IDENTIFICATION  Patient Name: Stephen Bowen Birthdate: 06-19-1956 Sex: male Admission Date (Current Location): 03/27/2024  [facility] and [place] Number:  Producer, television/film/video and Address:  [facility],  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) 04/05/2024   DM type 2 (diabetes mellitus, type 2) (HCC) 04/05/2024   DNR (do not resuscitate) 04/04/2024   Counseling and coordination of care 04/04/2024   Goals of care, counseling/discussion 04/04/2024   Palliative care encounter 04/04/2024   Protein-calorie malnutrition, severe 04/01/2024   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   Hypopharyngeal mass 03/27/2024   Tracheostomy tube present (HCC) 03/27/2024   Wound infection 01/10/2018   Hemorrhage from wound 01/09/2018   Acute renal failure (ARF) 01/09/2018   Malnutrition of moderate degree 11/11/2017   Sepsis (HCC) 11/09/2017   Acute metabolic encephalopathy 11/09/2017   Anemia 11/09/2017   Tachycardia 11/09/2017   Non-healing wound of amputation stump (HCC) 10/08/2017   MRSA carrier 08/02/2016   Aortic atherosclerosis 08/01/2016   Coronary atherosclerosis of native coronary artery 08/01/2016   Underweight 08/01/2016   AKI (acute kidney injury) 07/30/2016   HLD (hyperlipidemia) 07/30/2016   GERD (gastroesophageal reflux disease) 07/30/2016    Seizure disorder (HCC) 07/30/2016   Abdominal pain 07/30/2016   Pressure injury of skin 06/28/2016   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    Cortical blindness of right side of brain    Aphasia    Hypotension 06/21/2016   Stroke (HCC) 06/21/2016   Acute MI, anterolateral wall, initial episode of care [facility])    Preoperative cardiovascular examination    PAD (peripheral artery disease) 02/13/2016   Hyperkalemia 02/13/2016   Lactic acid increased 02/13/2016   Essential hypertension 02/13/2016   Seborrheic dermatitis 08/31/2015   Actinic keratosis of multiple sites of head and neck 08/31/2015   Tobacco use disorder 08/31/2015   Skin ulcer of scalp (HCC) 08/31/2015   Abnormal EKG 11/13/2013   Hypertensive urgency 11/13/2013    Orientation RESPIRATION BLADDER Height & Weight     Self, Time, Situation, Place  Tracheostomy, O2 (6L) Incontinent Weight: 97 lb 10.6 oz (44.3 kg) Height:  5' 6 (167.6 cm)  BEHAVIORAL SYMPTOMS/MOOD NEUROLOGICAL BOWEL NUTRITION STATUS      Incontinent Feeding tube  AMBULATORY STATUS COMMUNICATION OF NEEDS Skin   Extensive Assist Verbally PU Stage and Appropriate Care, Other (Comment) (see d/c summary)                       Personal Care Assistance Level of Assistance  Bathing, Feeding, Dressing Bathing Assistance: Limited assistance Feeding assistance: Independent Dressing Assistance: Limited assistance     Functional Limitations Info  Sight, Hearing, Speech Sight Info: Adequate Hearing Info: Adequate Speech Info: Adequate    SPECIAL CARE FACTORS FREQUENCY  PT (By licensed PT), OT (By licensed OT)     PT Frequency:  5 x a week OT Frequency: 5 x a week            Contractures Contractures Info: Not present    Additional Factors Info  Code Status, Allergies Code Status Info: DNR Allergies Info: no known allergies           Current Medications (04/13/2024):  This is the current hospital active medication list Current  Facility-Administered Medications  Medication Dose Route Frequency Provider Last Rate Last Admin   acetaminophen  (TYLENOL ) tablet 650 mg  650 mg Per Tube Q6H PRN Jadine Toribio SQUIBB, MD   650 mg at 04/03/24 9076   aspirin  chewable tablet 81 mg  81 mg Per Tube Daily Elgergawy, Brayton RAMAN, MD   81 mg at 04/12/24 0929   atorvastatin  (LIPITOR ) tablet 40 mg  40 mg Per Tube q1800 Elgergawy, Brayton RAMAN, MD   40 mg at 04/12/24 1716   cefTRIAXone (ROCEPHIN) 1 g in sodium chloride  0.9 % 100 mL IVPB  1 g Intravenous Q24H Alekh, Kshitiz, MD 200 mL/hr at 04/12/24 1558 1 g at 04/12/24 1558   diclofenac Sodium (VOLTAREN) 1 % topical gel 2 g  2 g Topical QID Pahwani, Ravi, MD   2 g at 04/12/24 2300   docusate (COLACE) 50 MG/5ML liquid 100 mg  100 mg Per Tube BID Simaan, Elizabeth S, PA-C   100 mg at 04/12/24 2300   famotidine (PEPCID) tablet 20 mg  20 mg Per Tube BID Simaan, Elizabeth S, PA-C   20 mg at 04/12/24 2300   feeding supplement (OSMOLITE 1.2 CAL) liquid 1,000 mL  1,000 mL Per Tube Continuous Elgergawy, Dawood S, MD 55 mL/hr at 04/12/24 0643 1,000 mL at 04/12/24 0643   free water 100 mL  100 mL Per Tube Q6H Elgergawy, Brayton RAMAN, MD   100 mL at 04/13/24 0617   glycopyrrolate  (ROBINUL ) injection 0.2 mg  0.2 mg Intravenous TID Anwar, Zeba, MD   0.2 mg at 04/12/24 2300   glycopyrrolate  (ROBINUL ) tablet 1 mg  1 mg Per Tube TID PRN Jadine Toribio SQUIBB, MD   1 mg at 04/06/24 1438   guaiFENesin -dextromethorphan (ROBITUSSIN DM) 100-10 MG/5ML syrup 5 mL  5 mL Per Tube Q4H PRN Jadine Toribio SQUIBB, MD   5 mL at 04/06/24 1438   heparin  injection 5,000 Units  5,000 Units Subcutaneous Q8H Simaan, Elizabeth S, PA-C   5,000 Units at 04/13/24 9443   hydrALAZINE  (APRESOLINE ) injection 5 mg  5 mg Intravenous Q4H PRN Elgergawy, Dawood S, MD       HYDROmorphone  (DILAUDID ) injection 0.5-1 mg  0.5-1 mg Intravenous Q3H PRN Augustus Almarie RAMAN, PA-C   1 mg at 04/12/24 0930   insulin  aspart (novoLOG ) injection 0-9 Units  0-9 Units  Subcutaneous Q6H Jadine Toribio SQUIBB, MD   1 Units at 04/12/24 0640   levETIRAcetam (KEPPRA) tablet 500 mg  500 mg Per Tube BID Jadine Toribio SQUIBB, MD   500 mg at 04/12/24 2300   methocarbamol  (ROBAXIN ) tablet 500 mg  500 mg Per Tube Q8H Jadine Toribio SQUIBB, MD   500 mg at 04/13/24 9442   metoprolol  tartrate (LOPRESSOR ) tablet 37.5 mg  37.5 mg Per Tube BID Jadine Toribio SQUIBB, MD   37.5 mg at 04/12/24 2300   ondansetron  (ZOFRAN ) injection 4 mg  4 mg Intravenous Q6H PRN Cheryle Page, MD   4 mg at 04/09/24 1909   Oral care mouth rinse  15 mL Mouth Rinse 4 times per day Augustus Almarie RAMAN, PA-C   15  mL at 04/12/24 2300   Oral care mouth rinse  15 mL Mouth Rinse PRN Simaan, Elizabeth S, PA-C       oxyCODONE  (Oxy IR/ROXICODONE ) immediate release tablet 5 mg  5 mg Per Tube Q4H PRN Ko, Joseph E, PA-C   5 mg at 04/13/24 0556   polyethylene glycol (MIRALAX  / GLYCOLAX ) packet 17 g  17 g Per Tube Daily Augustus Almarie RAMAN, PA-C   17 g at 04/12/24 0930   scopolamine (TRANSDERM-SCOP) 1 MG/3DAYS 1 mg  1 patch Transdermal Q72H Jadine Toribio SQUIBB, MD   1 mg at 04/11/24 1252   sodium bicarbonate/sodium chloride  mouthwash 1000ml   Mouth Rinse PRN Anwar, Zeba, MD       traZODone (DESYREL) tablet 100 mg  100 mg Per Tube QHS PRN Jadine Toribio SQUIBB, MD         Discharge Medications: Please see discharge summary for a list of discharge medications.  Relevant Imaging Results:  Relevant Lab Results:   Additional Information [id number]  Tawni HERO Clover Feehan, LCSW

## 2024-04-13 NOTE — Progress Notes (Signed)
 SLP Cancellation Note  Patient Details Name: Stephen Bowen MRN: [id number] DOB: Nov 07, 1955   Cancelled treatment:       Reason Eval/Treat Not Completed: Patient declined, no reason specified. Patient shaking head no when SLP entered room prior to introducing self and role. Patient had blanket covering trach and at times he would finger occlude trach over the blanket. Even when getting some voicing, SLP had a very difficult time understanding him. He did say I'm trying. When asked about trying PMV he would shake his head. He appears very frustrated and gesturing to indicate he doesn't know what could be done to help him. SLP will discuss further with medical team.   Norleen IVAR Blase, MA, CCC-SLP Speech Therapy

## 2024-04-13 NOTE — Progress Notes (Signed)
Pt refused CBG.

## 2024-04-13 NOTE — Progress Notes (Signed)
   04/13/24 1204  Spiritual Encounters  Type of Visit Initial  Care provided to: Patient  Referral source Nurse (RN/NT/LPN)  Reason for visit Routine spiritual support   Per referral from [facility], [place], I offered spiritual care support to Mr Stephen Bowen.  Mr Stephen Bowen was resting in bed and gestured in a manner that indicated possible frustration, need, or dismissal of my visit. PulseOx machine was alarming, so I silenced it temporarily to engage him about disturbances and to further attempt to assess needs. He spoke in a whisper tone and indicated that staff had been in and out frequently.  I provided compassionate non-anxious presence and let Mr Stephen Bowen know I was glad to meet him, that I wanted to say good morning, but that I did not wish to disturb him further. I offered him reading material compatible with his indicated faith tradition and he stated that he did not read (unclear if unable or not interested). I shared something briefly and expressed gratitude to meet him. I let him know that I may check again later to offer ongoing support. I shared visit with his RN and notified about Pulse Ox.  Stephen Bowen HERO.Div

## 2024-04-13 NOTE — Plan of Care (Signed)
 Inter disciplinary discussions took place again this am, with TOC, TRH, RN, SLP and also radiation oncology colleagues Medication history noted.  As per prior goals of care discussions between palliative medicine team, patient and his sister - transfer back to his SNF rehab is deemed to be the next best step. Patient has declined radiation. PMV valve has been provided, secretions and pain management measures have been undertaken.  Recommend continuation of outpatient palliative care.  No charge [name] MD [facility].

## 2024-04-13 NOTE — Progress Notes (Signed)
 PROGRESS NOTE    Stephen Bowen  FMW:[id number] DOB: [birth year] DOA: 03/27/2024 PCP: Trinidad Glisson, MD   Brief Narrative:  68 year old man underwent elective biopsy of pharyngeal mass, then required tracheostomy on 03/27/2024, was subsequently admitted by critical care postoperatively, successfully taken off the ventilator.  Underwent gastrostomy tube placement on 03/30/2024.  He was subsequently transferred to the hospitalist service. Then transferred to [facility] for initiation of radiation therapy.  Oncology consulted: Patient not interested in pursuing chemotherapy at this time.  Palliative care following goals for goals of care.  Patient has been intermittently refusing care including radiation.  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  Underwent gastrostomy tube placement on 03/30/2024.  He was subsequently transferred to the hospitalist service. Then transferred to [facility] for initiation of radiation therapy. 7 weeks of radiation therapy planned.  Oncology consulted: Patient not interested in pursuing chemotherapy at this time.  Palliative care following goals for goals of care.  Patient has been intermittently refusing care including radiation.  If patient continues to refuse care, might benefit from hospice/comfort measures -Continue trach care.  Currently on 6 L oxygen via trach - Currently on Rocephin for providencia and Klebsiella and trach secretions.  Continue Rocephin for at least 7 days - Patient continues to refuse care intermittently including medications.  He apparently has requested that his sister make decision  regarding radiation treatment.  Apparently, the sister wants him to receive radiation treatments.  Radiation oncology team aware:  Not sure what radiation alone will help in his current condition.  Failure to thrive  Dysphagia Severe malnutrition -Continue PEG tube feeding  PAD CAD Past medical history of CVA Status post bilateral AKA - Continue aspirin , statin   Hypertension Hyperlipidemia -Continue statin and metoprolol    History of seizure disorder Possible dementia - Continue Keppra.  Delirium precautions.  Fall precautions  Diabetes mellitus type 2 - A1c 5.9.  Continue CBGs with SSI    DVT prophylaxis: Heparin  subcutaneous Code Status: DNR Family Communication: None at bedside Disposition Plan: Status is: Inpatient Remains inpatient appropriate because: Of severity of illness.  Need for SNF placement    Consultants: ENT/critical CARE/oncology/radiation oncology/palliative care  Procedures: As above  Antimicrobials:  Anti-infectives (From admission, onward)    Start     Dose/Rate Route Frequency Ordered Stop   04/09/24 1600  cefTRIAXone (ROCEPHIN) 1 g in sodium chloride  0.9 % 100 mL IVPB        1 g 200 mL/hr over 30 Minutes Intravenous Every 24 hours 04/09/24 1456     04/07/24 1500  cefTRIAXone (ROCEPHIN) injection 1 g  Status:  Discontinued        1 g Intramuscular Every 24 hours 04/07/24 1256 04/09/24 1455   04/07/24 1200  cefTRIAXone (ROCEPHIN) 1 g in sodium chloride  0.9 % 100 mL IVPB  Status:  Discontinued        1 g 200 mL/hr over 30 Minutes Intravenous Every 24 hours 04/07/24 1102 04/07/24 1256   04/06/24 1700  cefTRIAXone (ROCEPHIN) injection 1 g  Status:  Discontinued        1 g Intramuscular Every 24 hours 04/06/24 1536 04/07/24 1102   04/06/24 1500  cefTRIAXone (ROCEPHIN) 1 g in sodium chloride  0.9 % 100  mL IVPB  Status:  Discontinued        1 g 200 mL/hr over 30 Minutes Intravenous Every 24 hours 04/06/24 1444 04/06/24 1536   03/30/24 1216   ceFAZolin  (ANCEF ) 2-4 GM/100ML-% IVPB       Note to Pharmacy: [name] S: cabinet override      03/30/24 1216 03/30/24 1248   03/30/24 1215  ceFAZolin  (ANCEF ) IVPB 2g/100 mL premix        2 g 200 mL/hr over 30 Minutes Intravenous  Once 03/30/24 1115 03/30/24 1236        Subjective: Patient seen and examined at bedside.  Poor historian.  No fever, vomiting, seizures or agitation reported  objective: Vitals:   04/12/24 2110 04/13/24 0343 04/13/24 0500 04/13/24 0530  BP: 114/72   128/74  Pulse: 79 80  87  Resp: 18 14  18   Temp: 97.8 F (36.6 C)   98.1 F (36.7 C)  TempSrc: Oral   Oral  SpO2: 99% 99%  100%  Weight:   44.3 kg   Height:        Intake/Output Summary (Last 24 hours) at 04/13/2024 0827 Last data filed at 04/13/2024 0617 Gross per 24 hour  Intake 1005 ml  Output 2200 ml  Net -1195 ml   Filed Weights   03/30/24 1151 04/12/24 0459 04/13/24 0500  Weight: 42.7 kg 45.1 kg 44.3 kg    Examination:  General: Chronically ill and deconditioned.  Currently in no distress.   ENT/neck: No palpable thyromegaly or elevated JVD noted.  remains on 6 L oxygen via trach  respiratory: Decreased breath sounds at bases bilaterally with some crackles CVS: S1-S2 heard; currently rate controlled mostly  abdominal: Soft, nontender, slightly; no organomegaly, bowel sounds are heard normally.  PEG tube is present  extremities: Has bilateral AKA  CNS: Very slow to respond and extremely poor historian.  No obvious focal deficits noted  lymph: No lymphadenopathy palpable  skin: No obvious lesions/ecchymosis  psych: Extremely flat affect with no signs of agitation currently  Data Reviewed: I have personally reviewed following labs and imaging studies  CBC: No results for input(s): WBC, NEUTROABS, HGB, HCT, MCV, PLT in the last 168 hours. Basic Metabolic Panel: No results for input(s): NA, K, CL, CO2, GLUCOSE, BUN, CREATININE, CALCIUM , MG, PHOS  in the last 168 hours. GFR: Estimated Creatinine Clearance: 49.2 mL/min (by C-G formula based on SCr of 0.9 mg/dL). Liver Function Tests: No results for input(s): AST, ALT, ALKPHOS, BILITOT, PROT, ALBUMIN  in the last 168 hours. No results for input(s): LIPASE, AMYLASE in the last 168 hours. No results for input(s): AMMONIA in the last 168 hours. Coagulation Profile: No results for input(s): INR, PROTIME in the last 168 hours. Cardiac Enzymes: No results for input(s): CKTOTAL, CKMB, CKMBINDEX, TROPONINI in the last 168 hours. BNP (last 3 results) No results for input(s): PROBNP in the last 8760 hours. HbA1C: No results for input(s): HGBA1C in the last 72 hours. CBG: Recent Labs  Lab 04/12/24 0631 04/12/24 1144 04/12/24 1828 04/13/24 0009 04/13/24 0628  GLUCAP 126* 93 106* 87 103*   Lipid Profile: No results for input(s): CHOL, HDL, LDLCALC, TRIG, CHOLHDL, LDLDIRECT in the last 72 hours. Thyroid Function Tests: No results for input(s): TSH, T4TOTAL, FREET4, T3FREE, THYROIDAB in the last 72 hours. Anemia Panel: No results for input(s): VITAMINB12, FOLATE, FERRITIN, TIBC, IRON, RETICCTPCT in the last 72 hours. Sepsis Labs: No results for input(s): PROCALCITON, LATICACIDVEN in the last 168 hours.  Recent Results (  from the past 240 hours)  Expectorated Sputum Assessment w Gram Stain, Rflx to Resp Cult     Status: None   Collection Time: 04/03/24 12:10 PM   Specimen: Expectorated Sputum  Result Value Ref Range Status   Specimen Description EXPECTORATED SPUTUM  Final   Special Requests NONE  Final   Sputum evaluation   Final    THIS SPECIMEN IS ACCEPTABLE FOR SPUTUM CULTURE Performed at [facility], [address], [place], [place] [address]    Report Status 04/03/2024 FINAL  Final  Culture, Respiratory w Gram Stain     Status: None   Collection Time: 04/03/24 12:10 PM  Result Value Ref  Range Status   Specimen Description   Final    EXPECTORATED SPUTUM Performed at [facility], [address] [place], [place] [address]    Special Requests   Final    NONE Reflexed from [id number] Performed at [facility], [address] [place], [place] [address]    Gram Stain   Final    MODERATE SQUAMOUS EPITHELIAL CELLS PRESENT FEW WBC PRESENT, PREDOMINANTLY PMN ABUNDANT GRAM NEGATIVE RODS MODERATE GRAM POSITIVE COCCI FEW GRAM POSITIVE RODS Performed at [facility], [address] [place], [place] [address]    Culture   Final    ABUNDANT PROVIDENCIA STUARTII ABUNDANT KLEBSIELLA PNEUMONIAE Two isolates with different morphologies were identified as the same organism.The most resistant organism was reported. MOST RESISTANT ORGANISM REPORTED FOR BOTH ORGANISM 1 P. STUARTII AND 2 K. PNEUMONIAE ABUNDANT PROTEUS MIRABILIS    Report Status 04/08/2024 FINAL  Final   Organism ID, Bacteria PROVIDENCIA STUARTII  Final   Organism ID, Bacteria KLEBSIELLA PNEUMONIAE  Final   Organism ID, Bacteria PROTEUS MIRABILIS  Final      Susceptibility   Klebsiella pneumoniae - MIC*    AMPICILLIN >=32 RESISTANT Resistant     CEFAZOLIN  (NON-URINE) 4 INTERMEDIATE Intermediate     CEFEPIME <=0.12 SENSITIVE Sensitive     ERTAPENEM <=0.12 SENSITIVE Sensitive     CEFTRIAXONE <=0.25 SENSITIVE Sensitive     CIPROFLOXACIN <=0.06 SENSITIVE Sensitive     GENTAMICIN <=1 SENSITIVE Sensitive     MEROPENEM <=0.25 SENSITIVE Sensitive     TRIMETH /SULFA  <=20 SENSITIVE Sensitive     AMPICILLIN/SULBACTAM 8 SENSITIVE Sensitive     PIP/TAZO Value in next row Sensitive      8 SENSITIVEThis is a modified FDA-approved test that has been validated and its performance characteristics determined by the reporting laboratory.  This laboratory is certified under the Clinical Laboratory Improvement Amendments CLIA as qualified to perform high complexity clinical laboratory testing.     * ABUNDANT KLEBSIELLA PNEUMONIAE   Proteus mirabilis - MIC*    AMPICILLIN Value in next row Resistant      8 SENSITIVEThis is a modified FDA-approved test that has been validated and its performance characteristics determined by the reporting laboratory.  This laboratory is certified under the Clinical Laboratory Improvement Amendments CLIA as qualified to perform high complexity clinical laboratory testing.    CEFAZOLIN  (NON-URINE) Value in next row Intermediate      8 SENSITIVEThis is a modified FDA-approved test that has been validated and its performance characteristics determined by the reporting laboratory.  This laboratory is certified under the Clinical Laboratory Improvement Amendments CLIA as qualified to perform high complexity clinical laboratory testing.    CEFEPIME Value in next row Sensitive      8 SENSITIVEThis is a modified FDA-approved test that has  been validated and its performance characteristics determined by the reporting laboratory.  This laboratory is certified under the Clinical Laboratory Improvement Amendments CLIA as qualified to perform high complexity clinical laboratory testing.    ERTAPENEM Value in next row Sensitive      8 SENSITIVEThis is a modified FDA-approved test that has been validated and its performance characteristics determined by the reporting laboratory.  This laboratory is certified under the Clinical Laboratory Improvement Amendments CLIA as qualified to perform high complexity clinical laboratory testing.    CEFTRIAXONE Value in next row Sensitive      8 SENSITIVEThis is a modified FDA-approved test that has been validated and its performance characteristics determined by the reporting laboratory.  This laboratory is certified under the Clinical Laboratory Improvement Amendments CLIA as qualified to perform high complexity clinical laboratory testing.    CIPROFLOXACIN Value in next row Resistant      8 SENSITIVEThis is a modified FDA-approved test that  has been validated and its performance characteristics determined by the reporting laboratory.  This laboratory is certified under the Clinical Laboratory Improvement Amendments CLIA as qualified to perform high complexity clinical laboratory testing.    GENTAMICIN Value in next row Sensitive      8 SENSITIVEThis is a modified FDA-approved test that has been validated and its performance characteristics determined by the reporting laboratory.  This laboratory is certified under the Clinical Laboratory Improvement Amendments CLIA as qualified to perform high complexity clinical laboratory testing.    MEROPENEM Value in next row Sensitive      8 SENSITIVEThis is a modified FDA-approved test that has been validated and its performance characteristics determined by the reporting laboratory.  This laboratory is certified under the Clinical Laboratory Improvement Amendments CLIA as qualified to perform high complexity clinical laboratory testing.    TRIMETH /SULFA  Value in next row Resistant      8 SENSITIVEThis is a modified FDA-approved test that has been validated and its performance characteristics determined by the reporting laboratory.  This laboratory is certified under the Clinical Laboratory Improvement Amendments CLIA as qualified to perform high complexity clinical laboratory testing.    AMPICILLIN/SULBACTAM Value in next row Sensitive      8 SENSITIVEThis is a modified FDA-approved test that has been validated and its performance characteristics determined by the reporting laboratory.  This laboratory is certified under the Clinical Laboratory Improvement Amendments CLIA as qualified to perform high complexity clinical laboratory testing.    PIP/TAZO Value in next row Sensitive      <=4 SENSITIVEThis is a modified FDA-approved test that has been validated and its performance characteristics determined by the reporting laboratory.  This laboratory is certified under the Clinical Laboratory Improvement  Amendments CLIA as qualified to perform high complexity clinical laboratory testing.    * ABUNDANT PROTEUS MIRABILIS   Providencia stuartii - MIC*    AMPICILLIN Value in next row Resistant      <=4 SENSITIVEThis is a modified FDA-approved test that has been validated and its performance characteristics determined by the reporting laboratory.  This laboratory is certified under the Clinical Laboratory Improvement Amendments CLIA as qualified to perform high complexity clinical laboratory testing.    CEFEPIME Value in next row Sensitive      <=4 SENSITIVEThis is a modified FDA-approved test that has been validated and its performance characteristics determined by the reporting laboratory.  This laboratory is certified under the Clinical Laboratory Improvement Amendments CLIA as qualified to perform high complexity clinical laboratory testing.  ERTAPENEM Value in next row Intermediate      <=4 SENSITIVEThis is a modified FDA-approved test that has been validated and its performance characteristics determined by the reporting laboratory.  This laboratory is certified under the Clinical Laboratory Improvement Amendments CLIA as qualified to perform high complexity clinical laboratory testing.    CEFTRIAXONE Value in next row Sensitive      <=4 SENSITIVEThis is a modified FDA-approved test that has been validated and its performance characteristics determined by the reporting laboratory.  This laboratory is certified under the Clinical Laboratory Improvement Amendments CLIA as qualified to perform high complexity clinical laboratory testing.    CIPROFLOXACIN Value in next row Resistant      <=4 SENSITIVEThis is a modified FDA-approved test that has been validated and its performance characteristics determined by the reporting laboratory.  This laboratory is certified under the Clinical Laboratory Improvement Amendments CLIA as qualified to perform high complexity clinical laboratory testing.    GENTAMICIN  Value in next row Resistant      <=4 SENSITIVEThis is a modified FDA-approved test that has been validated and its performance characteristics determined by the reporting laboratory.  This laboratory is certified under the Clinical Laboratory Improvement Amendments CLIA as qualified to perform high complexity clinical laboratory testing.    MEROPENEM Value in next row Resistant      <=4 SENSITIVEThis is a modified FDA-approved test that has been validated and its performance characteristics determined by the reporting laboratory.  This laboratory is certified under the Clinical Laboratory Improvement Amendments CLIA as qualified to perform high complexity clinical laboratory testing.    TRIMETH /SULFA  Value in next row Resistant      <=4 SENSITIVEThis is a modified FDA-approved test that has been validated and its performance characteristics determined by the reporting laboratory.  This laboratory is certified under the Clinical Laboratory Improvement Amendments CLIA as qualified to perform high complexity clinical laboratory testing.    AMPICILLIN/SULBACTAM Value in next row Resistant      <=4 SENSITIVEThis is a modified FDA-approved test that has been validated and its performance characteristics determined by the reporting laboratory.  This laboratory is certified under the Clinical Laboratory Improvement Amendments CLIA as qualified to perform high complexity clinical laboratory testing.    PIP/TAZO Value in next row Sensitive      <=4 SENSITIVEThis is a modified FDA-approved test that has been validated and its performance characteristics determined by the reporting laboratory.  This laboratory is certified under the Clinical Laboratory Improvement Amendments CLIA as qualified to perform high complexity clinical laboratory testing.    * ABUNDANT PROVIDENCIA STUARTII         Radiology Studies: No results found.      Scheduled Meds:  aspirin   81 mg Per Tube Daily   atorvastatin   40 mg Per  Tube q1800   diclofenac Sodium  2 g Topical QID   docusate  100 mg Per Tube BID   famotidine  20 mg Per Tube BID   free water  100 mL Per Tube Q6H   glycopyrrolate   0.2 mg Intravenous TID   heparin  injection (subcutaneous)  5,000 Units Subcutaneous Q8H   insulin  aspart  0-9 Units Subcutaneous Q6H   levETIRAcetam  500 mg Per Tube BID   methocarbamol   500 mg Per Tube Q8H   metoprolol  tartrate  37.5 mg Per Tube BID   mouth rinse  15 mL Mouth Rinse 4 times per day   polyethylene glycol  17 g Per Tube Daily  scopolamine  1 patch Transdermal Q72H   Continuous Infusions:  cefTRIAXone (ROCEPHIN)  IV 1 g (04/12/24 1558)   feeding supplement (OSMOLITE 1.2 CAL) 1,000 mL (04/12/24 9356)          Sophie Mao, MD [facility] 04/13/2024, 8:27 AM

## 2024-04-13 NOTE — TOC Progression Note (Addendum)
 Transition of Care [facility]) - Progression Note    Patient Details  Name: Stephen Bowen MRN: [id number] Date of Birth: [birth year]  Transition of Care [facility]) CM/SW Contact  Tawni CHRISTELLA Eva, LCSW Phone Number: 04/13/2024, 12:07 PM  Clinical Narrative:     CSW spoke with Sherri from admissions at [facility]. CSW explained that at this time, the pt will not be receiving radiation treatment and that the pt's family would like him to return to the facility for rehab. Sherri reported that she will need to speak with their clinical team to determine if the pt can return. CSW is awaiting a return call. Care management to follow.   Adden  2:54pm   CSW spoke with [facility]. They reported that the pt will need to have the tracheostomy for 30 days prior to returning to the facility. Care Management to follow.   Expected Discharge Plan: Skilled Nursing Facility Barriers to Discharge: Continued Medical Work up               Expected Discharge Plan and Services In-house Referral: Clinical Social Work     Living arrangements for the past 2 months: Skilled Nursing Facility                                       Social Drivers of Health (SDOH) Interventions SDOH Screenings   Food Insecurity: Patient Unable To Answer (03/27/2024)  Housing: Low Risk  (03/27/2024)  Transportation Needs: No Transportation Needs (03/27/2024)  Utilities: Not At Risk (03/27/2024)  Depression (PHQ2-9): Low Risk  (03/02/2024)  Social Connections: Patient Unable To Answer (03/27/2024)  Tobacco Use: High Risk (03/30/2024)    Readmission Risk Interventions     No data to display

## 2024-04-14 ENCOUNTER — Ambulatory Visit

## 2024-04-14 ENCOUNTER — Ambulatory Visit (INDEPENDENT_AMBULATORY_CARE_PROVIDER_SITE_OTHER): Admitting: Otolaryngology

## 2024-04-14 DIAGNOSIS — R627 Adult failure to thrive: Secondary | ICD-10-CM

## 2024-04-14 DIAGNOSIS — R6889 Other general symptoms and signs: Secondary | ICD-10-CM

## 2024-04-14 DIAGNOSIS — J392 Other diseases of pharynx: Secondary | ICD-10-CM | POA: Diagnosis not present

## 2024-04-14 DIAGNOSIS — Z66 Do not resuscitate: Secondary | ICD-10-CM | POA: Diagnosis not present

## 2024-04-14 DIAGNOSIS — Z7189 Other specified counseling: Secondary | ICD-10-CM | POA: Diagnosis not present

## 2024-04-14 DIAGNOSIS — Z79899 Other long term (current) drug therapy: Secondary | ICD-10-CM

## 2024-04-14 DIAGNOSIS — Z515 Encounter for palliative care: Secondary | ICD-10-CM | POA: Diagnosis not present

## 2024-04-14 LAB — GLUCOSE, CAPILLARY
Glucose-Capillary: 120 mg/dL — ABNORMAL HIGH (ref 70–99)
Glucose-Capillary: 138 mg/dL — ABNORMAL HIGH (ref 70–99)

## 2024-04-14 MED ORDER — GLYCOPYRROLATE 1 MG PO TABS
1.0000 mg | ORAL_TABLET | Freq: Three times a day (TID) | ORAL | Status: DC
  Administered 2024-04-14 – 2024-04-27 (×20): 1 mg
  Filled 2024-04-14 (×23): qty 1

## 2024-04-14 MED ORDER — OSMOLITE 1.2 CAL PO LIQD
1000.0000 mL | ORAL | Status: DC
  Administered 2024-04-15 – 2024-04-18 (×5): 1000 mL
  Filled 2024-04-14 (×8): qty 1000

## 2024-04-14 NOTE — Progress Notes (Signed)
 Pt's ATC setup changed, inner cannula and gauze changed out as well. Pt tolerated well. RN updated.

## 2024-04-14 NOTE — Progress Notes (Signed)
 Daily Progress Note   Patient Name: Stephen Bowen       Date: 04/14/2024 DOB: February 08, 1956  Age: 68 y.o. MRN#: [id number] Attending Physician: Stephen Page, MD Primary Care Physician: Stephen Glisson, MD Admit Date: 03/27/2024 Length of Stay: 18 days  Reason for Consultation/Follow-up: Establishing goals of care  Subjective:   Reviewed EMR including recent documentation from hospitalist.  Patient has continued to refuse radiation and care at times.  Planning for patient to return to his LTC SNF.  TOC assisting with coordination of care.  Patient was sleeping comfortably so did not awaken.  Noted patient was receiving IV glycopyrrolate  and in preparation of patient returning to skilled nursing facility, appropriately changing over to scheduled glycopyrrolate  tablet via PEG to assist with secretion management.  Objective:   Vital Signs:  BP 101/71 (BP Location: Left Arm)   Pulse 81   Temp 98.4 F (36.9 C) (Oral)   Resp 17   Ht 5' 6 (1.676 m)   Wt 44.3 kg   SpO2 97%   BMI 15.76 kg/m   Physical Exam: General: Sleeping comfortably, cachectic, frail, chronically ill-appearing Cardiovascular: RRR Respiratory: no increased work of breathing noted, not in respiratory distress, tracheostomy in place with trach collar on 6L/min FiO2 28% Extremities: Bilateral BKAs Neuro: Sleeping comfortably  Assessment & Plan:   Assessment: Patient is a 68 year old male with a past medical history of squamous cell carcinoma of the pharynx status post tracheostomy, failure to thrive  status post PEG tube placement, severe protein calorie malnutrition, PAD, CAD, history of CVA, history of tobacco use, history of alcohol use, diabetes mellitus type 2, seizure disorder, dementia, bilateral BKA and long-term care resident [facility] [place] who was admitted on 03/27/2024 after undergoing elective pharyngeal mass biopsy that unfortunately began to ooze requiring cauterization and due to  the risk of recurrent bleeding after extubation, decision was made to proceed with tracheostomy placement.  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  Oncology and radiation oncology consulted for recommendations and evaluation.  Palliative medicine team consulted to assist with complex medical decision making.   Recommendations/Plan: # Complex medical decision making/goals of care:   - Patient has continued to refuse care and radiation.  Planning for patient to return to long-term SNF for care.  Patient's sister sister, Stephen/reported HCPOA, a supporting this.  TOC assisting with discharge coordination.  Palliative medicine team will continue to engage in conversations as able and appropriate moving forward                -  Code Status: Limited: Do not attempt resuscitation (DNR) -DNR-LIMITED -Do Not Intubate/DNI     # Symptom management   - Secretions   - Continue scopolamine 1 mg transdermal every 72 hours   - Stop IV glycopyrrolate    - Start glycopyrrolate  tablet 1 mg 3 times daily via PEG   - Pain   - Continue oxycodone  5 mg every 4 hours as needed  # Psycho-social/Spiritual Support:  - Support System: Sister-Stephen Bowen noted she is patient's Hotel manager   # Discharge Planning: Return to LTC SNF; [facility] assisting with discharge coordination   Thank you for allowing the palliative care team to participate in the care Stephen Bowen.  Stephen Radar, DO Palliative Care Provider PMT # [phone]  If patient remains symptomatic despite maximum doses, please call PMT at [phone] between 0700 and 1900. Outside of these hours, please call attending, as PMT does not  have night coverage.

## 2024-04-14 NOTE — Progress Notes (Signed)
 PROGRESS NOTE    Stephen Bowen  FMW:[id number] DOB: 11-Nov-1955 DOA: 03/27/2024 PCP: Trinidad Glisson, MD   Brief Narrative:  68 year old man underwent elective biopsy of pharyngeal mass, then required tracheostomy on 03/27/2024, was subsequently admitted by critical care postoperatively, successfully taken off the ventilator.  Underwent gastrostomy tube placement on 03/30/2024.  He was subsequently transferred to the hospitalist service. Then transferred to [facility] for initiation of radiation therapy.  Oncology consulted: Patient not interested in pursuing chemotherapy at this time.  Palliative care following goals for goals of care.  Patient has been intermittently refusing care including radiation.  PT recommending SNF placement.  TOC following.  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  Underwent gastrostomy tube placement on 03/30/2024.  He was subsequently transferred to the hospitalist service. Then transferred to [facility] for initiation of radiation therapy. Oncology consulted: Patient not interested in pursuing chemotherapy at this time.  Palliative care following goals for goals of care.  Patient has been intermittently refusing care including radiation.  If patient continues to refuse care, might benefit from hospice/comfort measures -Continue trach care.  Currently on 6 L oxygen via trach - Currently on Rocephin for providencia and Klebsiella and trach secretions.  Continue Rocephin for at least 7 days -Patient continues to refuse care intermittently including medications.  He apparently has requested that his sister make decision  regarding radiation treatment.  Apparently, the sister wants him to receive radiation treatments.  Radiation oncology team aware.  This will need to be pursued as an outpatient as per radiation oncology team.  Not sure what radiation alone will help in his current condition.  Failure to thrive  Dysphagia Severe malnutrition -Continue PEG tube feeding  PAD CAD Past medical history of CVA Status post bilateral AKA - Continue aspirin , statin   Hypertension Hyperlipidemia -Continue statin and metoprolol    History of seizure disorder Possible dementia - Continue Keppra.  Delirium precautions.  Fall precautions  Diabetes mellitus type 2 - A1c 5.9.  Continue CBGs with SSI    DVT prophylaxis: Heparin  subcutaneous Code Status: DNR Family Communication: None at bedside Disposition Plan: Status is: Inpatient Remains inpatient appropriate because: Of severity of illness.  Need for SNF placement    Consultants: ENT/critical CARE/oncology/radiation oncology/palliative care  Procedures: As above  Antimicrobials:  Anti-infectives (From admission, onward)    Start     Dose/Rate Route Frequency Ordered Stop   04/09/24 1600  cefTRIAXone (ROCEPHIN) 1 g in sodium chloride  0.9 % 100 mL IVPB        1 g 200 mL/hr over 30 Minutes Intravenous Every 24 hours 04/09/24 1456     04/07/24 1500  cefTRIAXone (ROCEPHIN) injection 1 g  Status:  Discontinued        1 g Intramuscular Every 24 hours 04/07/24 1256 04/09/24 1455   04/07/24 1200  cefTRIAXone (ROCEPHIN) 1 g in sodium chloride  0.9 % 100 mL IVPB  Status:  Discontinued        1 g 200 mL/hr over 30 Minutes Intravenous Every 24 hours 04/07/24 1102 04/07/24 1256   04/06/24 1700  cefTRIAXone (ROCEPHIN) injection 1 g  Status:  Discontinued        1 g Intramuscular Every 24 hours 04/06/24 1536 04/07/24 1102  04/06/24 1500  cefTRIAXone (ROCEPHIN) 1 g in sodium chloride  0.9 % 100 mL IVPB  Status:  Discontinued        1 g 200 mL/hr over 30 Minutes  Intravenous Every 24 hours 04/06/24 1444 04/06/24 1536   03/30/24 1216  ceFAZolin  (ANCEF ) 2-4 GM/100ML-% IVPB       Note to Pharmacy: [name] S: cabinet override      03/30/24 1216 03/30/24 1248   03/30/24 1215  ceFAZolin  (ANCEF ) IVPB 2g/100 mL premix        2 g 200 mL/hr over 30 Minutes Intravenous  Once 03/30/24 1115 03/30/24 1236        Subjective: Patient seen and examined at bedside.  Poor historian.  No agitation, fever or vomiting reported.  Continues to intermittently refuse care as per nursing staff.  Objective: Vitals:   04/13/24 1628 04/13/24 2140 04/14/24 0500 04/14/24 0558  BP:  113/78  112/76  Pulse: 94 95    Resp: 20 16  16   Temp:  98.5 F (36.9 C)  97.9 F (36.6 C)  TempSrc:  Oral  Oral  SpO2: 100% 98%  98%  Weight:   44.3 kg   Height:        Intake/Output Summary (Last 24 hours) at 04/14/2024 0749 Last data filed at 04/14/2024 9367 Gross per 24 hour  Intake 2109.55 ml  Output 1400 ml  Net 709.55 ml   Filed Weights   04/12/24 0459 04/13/24 0500 04/14/24 0500  Weight: 45.1 kg 44.3 kg 44.3 kg    Examination:  General: Chronically ill and deconditioned.  In no distress currently. ENT/neck: No JVD elevation or palpable neck masses noted. Still on 6 L oxygen via tracheostomy respiratory: Bilateral decreased breath sounds at bases with scattered crackles CVS: Rate controlled; S1 and S2 heard  abdominal: Soft, nontender, remains distended slightly; no organomegaly, bowel sounds are heard.  PEG tube present extremities: Bilateral AKA present CNS: Wakes up slightly, extremely slow to respond and very poor historian.  Focal deficits noted lymph: No palpable lymphadenopathy noted  skin: No obvious rashes/petechiae psych: Very flat affect; not agitated currently  Data Reviewed: I have personally reviewed following labs and imaging studies  CBC: No results for input(s): WBC, NEUTROABS, HGB, HCT, MCV, PLT in the last 168 hours. Basic  Metabolic Panel: No results for input(s): NA, K, CL, CO2, GLUCOSE, BUN, CREATININE, CALCIUM , MG, PHOS in the last 168 hours. GFR: Estimated Creatinine Clearance: 49.2 mL/min (by C-G formula based on SCr of 0.9 mg/dL). Liver Function Tests: No results for input(s): AST, ALT, ALKPHOS, BILITOT, PROT, ALBUMIN  in the last 168 hours. No results for input(s): LIPASE, AMYLASE in the last 168 hours. No results for input(s): AMMONIA in the last 168 hours. Coagulation Profile: No results for input(s): INR, PROTIME in the last 168 hours. Cardiac Enzymes: No results for input(s): CKTOTAL, CKMB, CKMBINDEX, TROPONINI in the last 168 hours. BNP (last 3 results) No results for input(s): PROBNP in the last 8760 hours. HbA1C: No results for input(s): HGBA1C in the last 72 hours. CBG: Recent Labs  Lab 04/12/24 1828 04/13/24 0009 04/13/24 0628 04/13/24 1850 04/14/24 0559  GLUCAP 106* 87 103* 97 120*   Lipid Profile: No results for input(s): CHOL, HDL, LDLCALC, TRIG, CHOLHDL, LDLDIRECT in the last 72 hours. Thyroid Function Tests: No results for input(s): TSH, T4TOTAL, FREET4, T3FREE, THYROIDAB in the last 72 hours. Anemia Panel: No results for input(s): VITAMINB12, FOLATE, FERRITIN, TIBC, IRON, RETICCTPCT in the last 72 hours. Sepsis Labs:  No results for input(s): PROCALCITON, LATICACIDVEN in the last 168 hours.  No results found for this or any previous visit (from the past 240 hours).        Radiology Studies: No results found.      Scheduled Meds:  aspirin   81 mg Per Tube Daily   atorvastatin   40 mg Per Tube q1800   diclofenac Sodium  2 g Topical QID   docusate  100 mg Per Tube BID   famotidine  20 mg Per Tube BID   free water  100 mL Per Tube Q6H   glycopyrrolate   0.2 mg Intravenous TID   heparin  injection (subcutaneous)  5,000 Units Subcutaneous Q8H   insulin  aspart  0-9 Units  Subcutaneous Q6H   levETIRAcetam  500 mg Per Tube BID   methocarbamol   500 mg Per Tube Q8H   metoprolol  tartrate  37.5 mg Per Tube BID   mouth rinse  15 mL Mouth Rinse 4 times per day   polyethylene glycol  17 g Per Tube Daily   scopolamine  1 patch Transdermal Q72H   Continuous Infusions:  cefTRIAXone (ROCEPHIN)  IV Stopped (04/14/24 0033)   feeding supplement (OSMOLITE 1.2 CAL) 55 mL/hr at 04/14/24 9367          Sophie Mao, MD [facility] 04/14/2024, 7:49 AM

## 2024-04-14 NOTE — Progress Notes (Signed)
 Nutrition Follow-up  DOCUMENTATION CODES:   Severe malnutrition in context of chronic illness  INTERVENTION:  - Increase tube feeding via PEG: Osmolite 1.2 at 60 ml/h (1440 ml per day)  +165mL Q6H FWF (400 ml per day) Provides 1728 kcal, 80 gm protein, free water daily    - Continue daily weights.              NUTRITION DIAGNOSIS:   Severe Malnutrition related to chronic illness as evidenced by severe fat depletion, severe muscle depletion, percent weight loss. *ongoing  GOAL:   Patient will meet greater than or equal to 90% of their needs *met with TF  MONITOR:   PO intake, Diet advancement, Supplement acceptance, Skin, TF tolerance, Weight trends, Labs  REASON FOR ASSESSMENT:   Consult Enteral/tube feeding initiation and management  ASSESSMENT:   Pt with PMH significant for: HTN, PAD, bilateral AKAs, CAD, HLD, diabetes mellitus, MI, dementia, CKD II. Admitted for tracheostomy placement and biopsy of pharyngeal mass. Has been with prolonged difficulty swallowing and some hemoptysis.  10/03 - admitted; tracheostomy placed; laryngoscopy and biopsy 10/04 - transitioned to trach collar 10/05 - transferred out of ICU 10/06 - PEG placed 10/7 - TF initiated 10/9 - Transferred to [facility] for initiation of radiation therapy   Patient continues to often be refusing care and medications.  Met with patient at bedside. Patient agitated with care he has been receiving. Mouthing words but difficult understand. Shook head no when asked if any issues with tolerating tube feeds.   Will increase tube feeds slightly to support increased nutrient needs with ongoing malnutrition.  Palliative care following patient. GOC discussions ongoing.     Admit Weight: 94# Current Weight: 97#  I&O's: +5.2L since 10/7   Medications reviewed and include: Colace, Miralax , 100mL Q6H FWF   Labs reviewed:  No BMP since 10/8  Diet Order:   Diet Order             Diet NPO time specified   Diet effective now                   EDUCATION NEEDS:  Not appropriate for education at this time  Skin:  Skin Assessment: Skin Integrity Issues: Skin Integrity Issues:: Stage I Stage I: coccyx  Last BM:  10/21 - type 5  Height:  Ht Readings from Last 1 Encounters:  03/30/24 5' 6 (1.676 m)   Weight:  Wt Readings from Last 1 Encounters:  04/14/24 44.3 kg   Ideal Body Weight:  43.8 kg  BMI:  Body mass index is 15.76 kg/m.  Estimated Nutritional Needs:  Kcal:  1500-1700 kcals Protein:  70-85g Fluid:  1.5-1.7L/day    Trude Ned RD, LDN Contact via Secure Chat.

## 2024-04-15 ENCOUNTER — Ambulatory Visit

## 2024-04-15 DIAGNOSIS — J392 Other diseases of pharynx: Secondary | ICD-10-CM | POA: Diagnosis not present

## 2024-04-15 LAB — CBC WITH DIFFERENTIAL/PLATELET
Abs Immature Granulocytes: 0.52 K/uL — ABNORMAL HIGH (ref 0.00–0.07)
Basophils Absolute: 0.1 K/uL (ref 0.0–0.1)
Basophils Relative: 1 %
Eosinophils Absolute: 0.5 K/uL (ref 0.0–0.5)
Eosinophils Relative: 4 %
HCT: 32.6 % — ABNORMAL LOW (ref 39.0–52.0)
Hemoglobin: 10.2 g/dL — ABNORMAL LOW (ref 13.0–17.0)
Immature Granulocytes: 4 %
Lymphocytes Relative: 9 %
Lymphs Abs: 1.1 K/uL (ref 0.7–4.0)
MCH: 29.8 pg (ref 26.0–34.0)
MCHC: 31.3 g/dL (ref 30.0–36.0)
MCV: 95.3 fL (ref 80.0–100.0)
Monocytes Absolute: 1.1 K/uL — ABNORMAL HIGH (ref 0.1–1.0)
Monocytes Relative: 9 %
Neutro Abs: 9.3 K/uL — ABNORMAL HIGH (ref 1.7–7.7)
Neutrophils Relative %: 73 %
Platelets: 769 K/uL — ABNORMAL HIGH (ref 150–400)
RBC: 3.42 MIL/uL — ABNORMAL LOW (ref 4.22–5.81)
RDW: 14.5 % (ref 11.5–15.5)
WBC: 12.6 K/uL — ABNORMAL HIGH (ref 4.0–10.5)
nRBC: 0 % (ref 0.0–0.2)

## 2024-04-15 LAB — COMPREHENSIVE METABOLIC PANEL WITH GFR
ALT: 35 U/L (ref 0–44)
AST: 12 U/L — ABNORMAL LOW (ref 15–41)
Albumin: 3.3 g/dL — ABNORMAL LOW (ref 3.5–5.0)
Alkaline Phosphatase: 85 U/L (ref 38–126)
Anion gap: 11 (ref 5–15)
BUN: 19 mg/dL (ref 8–23)
CO2: 25 mmol/L (ref 22–32)
Calcium: 9.5 mg/dL (ref 8.9–10.3)
Chloride: 98 mmol/L (ref 98–111)
Creatinine, Ser: 0.65 mg/dL (ref 0.61–1.24)
GFR, Estimated: >60 mL/min (ref >60)
Glucose, Bld: 141 mg/dL — ABNORMAL HIGH (ref 70–99)
Potassium: 4.7 mmol/L (ref 3.5–5.1)
Sodium: 134 mmol/L — ABNORMAL LOW (ref 135–145)
Total Bilirubin: <0.2 mg/dL (ref 0.0–1.2)
Total Protein: 6.6 g/dL (ref 6.5–8.1)

## 2024-04-15 LAB — GLUCOSE, CAPILLARY
Glucose-Capillary: 113 mg/dL — ABNORMAL HIGH (ref 70–99)
Glucose-Capillary: 117 mg/dL — ABNORMAL HIGH (ref 70–99)
Glucose-Capillary: 129 mg/dL — ABNORMAL HIGH (ref 70–99)

## 2024-04-15 LAB — MAGNESIUM: Magnesium: 2.2 mg/dL (ref 1.7–2.4)

## 2024-04-15 NOTE — Progress Notes (Signed)
 Pt denied tracheal suctioning, has a strong cough (clear, white, blood). Placed a new aerosol trach set up @1235 

## 2024-04-15 NOTE — Progress Notes (Signed)
 PROGRESS NOTE    Stephen Bowen  FMW:[id number] DOB: [birth year] DOA: 03/27/2024 PCP: Trinidad Glisson, MD    Brief Narrative:   Stephen Bowen is a 68 y.o. male with past medical history significant for CAD/PAD, DM2, HTN, HLD, questionable dementia who presented to [facility] ED on 03/27/2024 for biopsy of pharyngeal mass and tracheostomy placement.  Patient underwent posterior pharyngeal mass biopsy with tracheostomy by ENT, Dr. Linell on 03/27/2024; with frozen section during case positive for SCC.  Patient was admitted to the intensive care unit/critical care service postoperatively.  Significant hospital events: 10/3: laryngoscopy, biopsy, tracheostomy; admitted to ICU/PCCM postoperatively  10/6: IR consulted for PEG placement>unable 2/2 anatomy, CCS consulted for gtube and underwent open gastrostomy tube placement by Dr. Ebbie; transferred to [facility] 10/9: Transferred to [facility] for radiation 10/22: Continues to refuse treatment/radiation, awaiting return to SNF but needs tracheostomy in place x 30 days, anticipate discharge back to [facility] on 11/3  Assessment & Plan:   Squamous cell carcinoma of the pharynx s/p tracheostomy Patient presenting for biopsy of pharyngeal mass and tracheostomy placement performed by ENT on 03/27/2024.  Postoperatively admitted to the pulmonary critical care service/ICU.  Pathology consistent with squamous cell carcinoma.  Seen by medical oncology, Dr. Conchetta on 10/10; patient not interested in chemotherapy.  Radiation oncology was consulted with recommendation of 7 weeks of radiation therapy which patient has been noncompliant and now has been discontinued. -- Continue trach care, 6 L via trach collar -- Awaiting return to SNF, SNF will not accept until 30 days post tracheostomy placement -- Outpatient follow-up with medical/radiation oncology, if continues to refuse consider transition to comfort measures/hospice  Providencia/Klebsiella  tracheostomy infection Tracheostomy culture positive for Providencia/Klebsiella.  -- Scopolamine patch, glycopyrrolate  1 g per tube 3 times daily -- Frequent suctioning -- Continue ceftriaxone to complete 7-day course  Seizure disorder -- Keppra 500 mg Perttu twice daily  HTN -- Metoprolol  tartrate 37.5 mg per tube BID  DM2 Hemoglobin C 5.9%. -- SSI for coverage -- CBGs q6h on tube feeds  CAD/PAD s/p bilateral AKA -- Aspirin  81 mg per tube daily -- Atorvastatin  40 mg per tube daily  Questionable dementia --Delirium precautions --Get up during the day --Encourage a familiar face to remain present throughout the day --Keep blinds open and lights on during daylight hours --Minimize the use of opioids/benzodiazepines  Adult failure to thrive  Solid food dysphagia secondary to squamous cell carcinoma pharynx Severe protein calorie malnutrition Nutrition Status: Nutrition Problem: Severe Malnutrition Etiology: chronic illness Signs/Symptoms: severe fat depletion, severe muscle depletion, percent weight loss Interventions: Refer to RD note for recommendations -- Surgical G-tube placed 10/6, continue tube feeds   DVT prophylaxis: heparin  injection 5,000 Units Start: 03/29/24 1515    Code Status: Limited: Do not attempt resuscitation (DNR) -DNR-LIMITED -Do Not Intubate/DNI  Family Communication:   Disposition Plan:  Level of care: Progressive Status is: Inpatient Remains inpatient appropriate because: Awaiting 30-day out time from tracheostomy placement for return to SNF    Consultants:  ENT, Dr. Linell [facility] General Surgery Interventional radiology Medical oncology, Dr. Conchetta Radiation oncology Palliative care  Procedures:  laryngoscopy, biopsy, tracheostomy; Dr. Linell 10/3 Surgical G-tube placement, Dr. Ebbie 10/6  Antimicrobials:  Cefazolin  10/6 - 10/6 Ceftriaxone 10/17 - 10/23   Subjective: Patient seen examined bedside, lying in bed.  Appears  comfortable.  No complaints.  Continues to refuse treatments including radiation.  Awaiting 30-day timeframe from placement of tracheostomy per SNF guidelines for return.  No  other questions or concerns at this time.  Denies headache, no dizziness, no chest pain, no shortness of breath, no abdominal pain.  No acute events overnight per nursing staff.  Objective: Vitals:   04/14/24 1930 04/15/24 1046 04/15/24 1356 04/15/24 1602  BP: 117/77  110/70   Pulse: 80 96 84   Resp:  14 16   Temp: 98.2 F (36.8 C)  98.1 F (36.7 C)   TempSrc: Oral  Oral   SpO2:  95% 98% 98%  Weight:      Height:        Intake/Output Summary (Last 24 hours) at 04/15/2024 1754 Last data filed at 04/15/2024 1700 Gross per 24 hour  Intake 2389.33 ml  Output 750 ml  Net 1639.33 ml   Filed Weights   04/12/24 0459 04/13/24 0500 04/14/24 0500  Weight: 45.1 kg 44.3 kg 44.3 kg    Examination:  Physical Exam: GEN: NAD, alert, chronically ill in appearance appears older than stated age HEENT: NCAT, PERRL, EOMI, sclera clear, dry mucous membranes, tracheostomy noted in place with clear secretions PULM: CTAB w/o wheezes/crackles, normal respiratory effort, on 6 L trach collar CV: RRR GI: abd soft, NTND, + BS; G-tube noted MSK: Noted bilateral AKA    Data Reviewed: I have personally reviewed following labs and imaging studies  CBC: Recent Labs  Lab 04/15/24 0956  WBC 12.6*  NEUTROABS 9.3*  HGB 10.2*  HCT 32.6*  MCV 95.3  PLT 769*   Basic Metabolic Panel: Recent Labs  Lab 04/15/24 0956  NA 134*  K 4.7  CL 98  CO2 25  GLUCOSE 141*  BUN 19  CREATININE 0.65  CALCIUM  9.5  MG 2.2   GFR: Estimated Creatinine Clearance: 55.4 mL/min (by C-G formula based on SCr of 0.65 mg/dL). Liver Function Tests: Recent Labs  Lab 04/15/24 0956  AST 12*  ALT 35  ALKPHOS 85  BILITOT <0.2  PROT 6.6  ALBUMIN  3.3*   No results for input(s): LIPASE, AMYLASE in the last 168 hours. No results for  input(s): AMMONIA in the last 168 hours. Coagulation Profile: No results for input(s): INR, PROTIME in the last 168 hours. Cardiac Enzymes: No results for input(s): CKTOTAL, CKMB, CKMBINDEX, TROPONINI in the last 168 hours. BNP (last 3 results) No results for input(s): PROBNP in the last 8760 hours. HbA1C: No results for input(s): HGBA1C in the last 72 hours. CBG: Recent Labs  Lab 04/13/24 0628 04/13/24 1850 04/14/24 0559 04/14/24 1133 04/15/24 1135  GLUCAP 103* 97 120* 138* 129*   Lipid Profile: No results for input(s): CHOL, HDL, LDLCALC, TRIG, CHOLHDL, LDLDIRECT in the last 72 hours. Thyroid Function Tests: No results for input(s): TSH, T4TOTAL, FREET4, T3FREE, THYROIDAB in the last 72 hours. Anemia Panel: No results for input(s): VITAMINB12, FOLATE, FERRITIN, TIBC, IRON, RETICCTPCT in the last 72 hours. Sepsis Labs: No results for input(s): PROCALCITON, LATICACIDVEN in the last 168 hours.  No results found for this or any previous visit (from the past 240 hours).       Radiology Studies: No results found.      Scheduled Meds:  aspirin   81 mg Per Tube Daily   atorvastatin   40 mg Per Tube q1800   diclofenac Sodium  2 g Topical QID   docusate  100 mg Per Tube BID   famotidine  20 mg Per Tube BID   free water  100 mL Per Tube Q6H   glycopyrrolate   1 mg Per Tube TID   heparin  injection (subcutaneous)  5,000 Units Subcutaneous Q8H   insulin  aspart  0-9 Units Subcutaneous Q6H   levETIRAcetam  500 mg Per Tube BID   methocarbamol   500 mg Per Tube Q8H   metoprolol  tartrate  37.5 mg Per Tube BID   mouth rinse  15 mL Mouth Rinse 4 times per day   polyethylene glycol  17 g Per Tube Daily   scopolamine  1 patch Transdermal Q72H   Continuous Infusions:  cefTRIAXone (ROCEPHIN)  IV 1 g (04/15/24 1652)   feeding supplement (OSMOLITE 1.2 CAL) 60 mL/hr at 04/15/24 1431     LOS: 19 days    Time spent: 45 minutes  spent on 04/15/2024 caring for this patient face-to-face including chart review, ordering labs/tests, documenting, discussion with nursing staff, consultants, updating family and interview/physical exam    Camellia PARAS [place], DO [facility] Available via Epic secure chat 7am-7pm After these hours, please refer to coverage provider listed on [email or web address] 04/15/2024, 5:54 PM

## 2024-04-15 NOTE — Progress Notes (Addendum)
 Pt denied need for tracheal suctioning, has a strong productive cough. Placed mepilex under trach flange, skin integrity is red and warm but intact.

## 2024-04-15 NOTE — Progress Notes (Signed)
 Trach care completed.  All dressing changed and inner cannula. Patient needs to be suctioned but is refusing.

## 2024-04-16 ENCOUNTER — Ambulatory Visit

## 2024-04-16 DIAGNOSIS — J392 Other diseases of pharynx: Secondary | ICD-10-CM | POA: Diagnosis not present

## 2024-04-16 LAB — GLUCOSE, CAPILLARY: Glucose-Capillary: 114 mg/dL — ABNORMAL HIGH (ref 70–99)

## 2024-04-16 NOTE — Progress Notes (Signed)
 PROGRESS NOTE    Stephen Bowen  FMW:[id number] DOB: 1955/07/12 DOA: 03/27/2024 PCP: Trinidad Glisson, MD    Brief Narrative:   Stephen Bowen is a 68 y.o. male with past medical history significant for CAD/PAD, DM2, HTN, HLD, questionable dementia who presented to [facility] ED on 03/27/2024 for biopsy of pharyngeal mass and tracheostomy placement.  Patient underwent posterior pharyngeal mass biopsy with tracheostomy by ENT, Dr. Linell on 03/27/2024; with frozen section during case positive for SCC.  Patient was admitted to the intensive care unit/critical care service postoperatively.  Significant hospital events: 10/3: laryngoscopy, biopsy, tracheostomy; admitted to ICU/PCCM postoperatively  10/6: IR consulted for PEG placement>unable 2/2 anatomy, CCS consulted for gtube and underwent open gastrostomy tube placement by Dr. 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  Postoperatively admitted to the pulmonary critical care service/ICU.  Pathology consistent with squamous cell carcinoma.  Seen by medical oncology, Dr. Conchetta on 10/10; patient not interested in chemotherapy.  Radiation oncology was consulted with recommendation of 7 weeks of radiation therapy which patient has been noncompliant and now has been discontinued. -- Continue trach care, 6 L via trach collar -- Awaiting return to SNF, SNF will not accept until 30 days post tracheostomy placement -- Outpatient follow-up with medical/radiation oncology, if continues to refuse consider transition to comfort measures/hospice  Providencia/Klebsiella  tracheostomy infection Tracheostomy culture positive for Providencia/Klebsiella.  -- Scopolamine patch, glycopyrrolate  1 g per tube 3 times daily -- Frequent suctioning -- Continue ceftriaxone to complete 7-day course  Seizure disorder -- Keppra 500 mg per tube twice daily  HTN -- Metoprolol  tartrate 37.5 mg per tube BID  DM2 Hemoglobin C 5.9%. -- SSI for coverage -- CBGs q6h on tube feeds  CAD/PAD s/p bilateral AKA -- Aspirin  81 mg per tube daily -- Atorvastatin  40 mg per tube daily  Questionable dementia --Delirium precautions --Get up during the day --Encourage a familiar face to remain present throughout the day --Keep blinds open and lights on during daylight hours --Minimize the use of opioids/benzodiazepines  Adult failure to thrive  Solid food dysphagia secondary to squamous cell carcinoma pharynx Severe protein calorie malnutrition Nutrition Status: Nutrition Problem: Severe Malnutrition Etiology: chronic illness Signs/Symptoms: severe fat depletion, severe muscle depletion, percent weight loss Interventions: Refer to RD note for recommendations -- Surgical G-tube placed 10/6, continue tube feeds   DVT prophylaxis: heparin  injection 5,000 Units Start: 03/29/24 1515    Code Status: Limited: Do not attempt resuscitation (DNR) -DNR-LIMITED -Do Not Intubate/DNI  Family Communication:   Disposition Plan:  Level of care: Progressive Status is: Inpatient Remains inpatient appropriate because: Awaiting 30-day out time from tracheostomy placement for return to SNF    Consultants:  ENT, Dr. Linell [facility] General Surgery Interventional radiology Medical oncology, Dr. Conchetta Radiation oncology Palliative care  Procedures:  laryngoscopy, biopsy, tracheostomy; Dr. Linell 10/3 Surgical G-tube placement, Dr. Ebbie 10/6  Antimicrobials:  Cefazolin  10/6 - 10/6 Ceftriaxone 10/17 - 10/23   Subjective: Patient seen examined bedside, lying in bed.  Appears  comfortable.  No complaints. Awaiting 30-day timeframe from placement of tracheostomy per SNF guidelines for return.  No other questions or concerns at this time.  Denies headache, no dizziness, no chest pain, no shortness of breath, no abdominal pain.  No acute events overnight per nursing staff.  Objective: Vitals:   04/16/24 0838 04/16/24 0910 04/16/24 1331 04/16/24 1350  BP: 99/72  101/67   Pulse: 83 78 77 80  Resp: 16  16   Temp: 98.3 F (36.8 C)  98.6 F (37 C)   TempSrc: Oral  Oral   SpO2: 96% 100% 99% 99%  Weight:      Height:        Intake/Output Summary (Last 24 hours) at 04/16/2024 1640 Last data filed at 04/16/2024 1220 Gross per 24 hour  Intake 1141.28 ml  Output 700 ml  Net 441.28 ml   Filed Weights   04/12/24 0459 04/13/24 0500 04/14/24 0500  Weight: 45.1 kg 44.3 kg 44.3 kg    Examination:  Physical Exam: GEN: NAD, alert, chronically ill in appearance appears older than stated age HEENT: NCAT, PERRL, EOMI, sclera clear, dry mucous membranes, tracheostomy noted in place with clear secretions PULM: CTAB w/o wheezes/crackles, normal respiratory effort, on 6 L trach collar CV: RRR GI: abd soft, NTND, + BS; G-tube noted MSK: Noted bilateral AKA    Data Reviewed: I have personally reviewed following labs and imaging studies  CBC: Recent Labs  Lab 04/15/24 0956  WBC 12.6*  NEUTROABS 9.3*  HGB 10.2*  HCT 32.6*  MCV 95.3  PLT 769*   Basic Metabolic Panel: Recent Labs  Lab 04/15/24 0956  NA 134*  K 4.7  CL 98  CO2 25  GLUCOSE 141*  BUN 19  CREATININE 0.65  CALCIUM  9.5  MG 2.2   GFR: Estimated Creatinine Clearance: 55.4 mL/min (by C-G formula based on SCr of 0.65 mg/dL). Liver Function Tests: Recent Labs  Lab 04/15/24 0956  AST 12*  ALT 35  ALKPHOS 85  BILITOT <0.2  PROT 6.6  ALBUMIN  3.3*   No results for input(s): LIPASE, AMYLASE in the last 168 hours. No results for input(s): AMMONIA in the last 168 hours. Coagulation  Profile: No results for input(s): INR, PROTIME in the last 168 hours. Cardiac Enzymes: No results for input(s): CKTOTAL, CKMB, CKMBINDEX, TROPONINI in the last 168 hours. BNP (last 3 results) No results for input(s): PROBNP in the last 8760 hours. HbA1C: No results for input(s): HGBA1C in the last 72 hours. CBG: Recent Labs  Lab 04/14/24 0559 04/14/24 1133 04/15/24 1135 04/15/24 1852 04/15/24 2353  GLUCAP 120* 138* 129* 117* 113*   Lipid Profile: No results for input(s): CHOL, HDL, LDLCALC, TRIG, CHOLHDL, LDLDIRECT in the last 72 hours. Thyroid Function Tests: No results for input(s): TSH, T4TOTAL, FREET4, T3FREE, THYROIDAB in the last 72 hours. Anemia Panel: No results for input(s): VITAMINB12, FOLATE, FERRITIN, TIBC, IRON, RETICCTPCT in the last 72 hours. Sepsis Labs: No results for input(s): PROCALCITON, LATICACIDVEN in the last 168 hours.  No results found for this or any previous visit (from the past 240 hours).       Radiology Studies: No results found.      Scheduled Meds:  aspirin   81 mg Per Tube Daily   atorvastatin   40 mg Per Tube q1800   diclofenac Sodium  2 g Topical QID   docusate  100 mg Per Tube BID   famotidine  20 mg Per Tube BID   free water  100 mL Per Tube Q6H   glycopyrrolate   1 mg Per Tube TID   heparin  injection (subcutaneous)  5,000 Units Subcutaneous Q8H   insulin  aspart  0-9 Units Subcutaneous Q6H   levETIRAcetam  500 mg Per Tube BID   methocarbamol   500 mg Per Tube Q8H   metoprolol  tartrate  37.5 mg Per Tube BID   mouth rinse  15 mL Mouth Rinse 4 times per day   polyethylene glycol  17 g Per Tube Daily   scopolamine  1 patch Transdermal Q72H   Continuous Infusions:  feeding supplement (OSMOLITE 1.2 CAL) 1,000 mL (04/15/24 2202)     LOS: 20 days    Time spent: 30 minutes spent on 04/16/2024 caring for this patient face-to-face including chart review, ordering labs/tests,  documenting, discussion with nursing staff, consultants, updating family and interview/physical exam    Camellia PARAS [place], DO [facility] Available via Epic secure chat 7am-7pm After these hours, please refer to coverage provider listed on [email or web address] 04/16/2024, 4:40 PM

## 2024-04-16 NOTE — Progress Notes (Signed)
 SLP Cancellation Note  Patient Details Name: Stephen Bowen MRN: [id number] DOB: 04/21/1956   Cancelled treatment:       Reason Eval/Treat Not Completed: Other (comment) (RN reports pt continues with copious secretions, he is not using his valve and is not requesting PO at this time; will attempt one more time next week as he is being treated for secretions)  Madelin POUR, MS [facility] SLP Acute Rehab Services Office [phone]  Nicolas Emmie Caldron 04/16/2024, 3:17 PM

## 2024-04-16 NOTE — Plan of Care (Signed)
 Patient still refusing radiation and some medications and other treatments/ADLs.

## 2024-04-17 ENCOUNTER — Ambulatory Visit

## 2024-04-17 DIAGNOSIS — J392 Other diseases of pharynx: Secondary | ICD-10-CM | POA: Diagnosis not present

## 2024-04-17 LAB — GLUCOSE, CAPILLARY
Glucose-Capillary: 109 mg/dL — ABNORMAL HIGH (ref 70–99)
Glucose-Capillary: 135 mg/dL — ABNORMAL HIGH (ref 70–99)
Glucose-Capillary: 136 mg/dL — ABNORMAL HIGH (ref 70–99)
Glucose-Capillary: 137 mg/dL — ABNORMAL HIGH (ref 70–99)

## 2024-04-17 NOTE — Progress Notes (Signed)
 PROGRESS NOTE    Stephen Bowen  FMW:[id number] DOB: [birth year] DOA: 03/27/2024 PCP: Trinidad Glisson, MD    Brief Narrative:   Stephen Bowen is a 68 y.o. male with past medical history significant for CAD/PAD, DM2, HTN, HLD, questionable dementia who presented to [facility] ED on 03/27/2024 for biopsy of pharyngeal mass and tracheostomy placement.  Patient underwent posterior pharyngeal mass biopsy with tracheostomy by ENT, Dr. Linell on 03/27/2024; with frozen section during case positive for SCC.  Patient was admitted to the intensive care unit/critical care service postoperatively.  Significant hospital events: 10/3: laryngoscopy, biopsy, tracheostomy; admitted to ICU/PCCM postoperatively  10/6: IR consulted for PEG placement>unable 2/2 anatomy, CCS consulted for gtube and underwent open gastrostomy tube placement by Dr. 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  Postoperatively admitted to the pulmonary critical care service/ICU.  Pathology consistent with squamous cell carcinoma.  Seen by medical oncology, Dr. Conchetta on 10/10; patient not interested in chemotherapy.  Radiation oncology was consulted with recommendation of 7 weeks of radiation therapy which patient has been noncompliant and now has been discontinued. -- Continue trach care, 6 L via trach collar -- Awaiting return to SNF, SNF will not accept until 30 days post tracheostomy placement -- Outpatient follow-up with medical/radiation oncology, if continues to refuse consider transition to comfort measures/hospice  Providencia/Klebsiella  tracheostomy infection Tracheostomy culture positive for Providencia/Klebsiella.  -- Scopolamine patch, glycopyrrolate  1 g per tube 3 times daily -- Frequent suctioning -- Continue ceftriaxone to complete 7-day course  Seizure disorder -- Keppra 500 mg per tube twice daily  HTN -- Metoprolol  tartrate 37.5 mg per tube BID  DM2 Hemoglobin C 5.9%. -- SSI for coverage -- CBGs q6h on tube feeds  CAD/PAD s/p bilateral AKA -- Aspirin  81 mg per tube daily -- Atorvastatin  40 mg per tube daily  Questionable dementia --Delirium precautions --Get up during the day --Encourage a familiar face to remain present throughout the day --Keep blinds open and lights on during daylight hours --Minimize the use of opioids/benzodiazepines  Adult failure to thrive  Solid food dysphagia secondary to squamous cell carcinoma pharynx Severe protein calorie malnutrition Nutrition Status: Nutrition Problem: Severe Malnutrition Etiology: chronic illness Signs/Symptoms: severe fat depletion, severe muscle depletion, percent weight loss Interventions: Refer to RD note for recommendations -- Surgical G-tube placed 10/6, continue tube feeds   DVT prophylaxis: heparin  injection 5,000 Units Start: 03/29/24 1515    Code Status: Limited: Do not attempt resuscitation (DNR) -DNR-LIMITED -Do Not Intubate/DNI  Family Communication:   Disposition Plan:  Level of care: Progressive Status is: Inpatient Remains inpatient appropriate because: Awaiting 30-day out time from tracheostomy placement for return to SNF    Consultants:  ENT, Dr. Linell [facility] General Surgery Interventional radiology Medical oncology, Dr. Conchetta Radiation oncology Palliative care  Procedures:  laryngoscopy, biopsy, tracheostomy; Dr. Linell 10/3 Surgical G-tube placement, Dr. Ebbie 10/6  Antimicrobials:  Cefazolin  10/6 - 10/6 Ceftriaxone 10/17 - 10/23   Subjective: Patient seen examined bedside, lying in bed.  Appears  comfortable.  No complaints, questions or concerns at this time.  Denies headache, no dizziness, no chest pain, no shortness of breath, no abdominal pain.  No acute events overnight per nursing staff.  Awaiting 30-day timeframe from placement of tracheostomy per SNF guidelines for return.  Objective: Vitals:   04/17/24 0601 04/17/24 0840 04/17/24 1200 04/17/24 1225  BP: 112/73   118/79  Pulse: 86 84  84  Resp: 19   18  Temp: 98.2 F (36.8 C)   98.3 F (36.8 C)  TempSrc: Oral   Oral  SpO2: 99% 99% 100% 100%  Weight:      Height:        Intake/Output Summary (Last 24 hours) at 04/17/2024 1439 Last data filed at 04/17/2024 1244 Gross per 24 hour  Intake 400 ml  Output 800 ml  Net -400 ml   Filed Weights   04/12/24 0459 04/13/24 0500 04/14/24 0500  Weight: 45.1 kg 44.3 kg 44.3 kg    Examination:  Physical Exam: GEN: NAD, alert, chronically ill in appearance appears older than stated age HEENT: NCAT, PERRL, EOMI, sclera clear, dry mucous membranes, tracheostomy noted in place with clear secretions PULM: CTAB w/o wheezes/crackles, normal respiratory effort, on 6 L trach collar CV: RRR GI: abd soft, NTND, + BS; G-tube noted MSK: Noted bilateral AKA    Data Reviewed: I have personally reviewed following labs and imaging studies  CBC: Recent Labs  Lab 04/15/24 0956  WBC 12.6*  NEUTROABS 9.3*  HGB 10.2*  HCT 32.6*  MCV 95.3  PLT 769*   Basic Metabolic Panel: Recent Labs  Lab 04/15/24 0956  NA 134*  K 4.7  CL 98  CO2 25  GLUCOSE 141*  BUN 19  CREATININE 0.65  CALCIUM  9.5  MG 2.2   GFR: Estimated Creatinine Clearance: 55.4 mL/min (by C-G formula based on SCr of 0.65 mg/dL). Liver Function Tests: Recent Labs  Lab 04/15/24 0956  AST 12*  ALT 35  ALKPHOS 85  BILITOT <0.2  PROT 6.6  ALBUMIN  3.3*   No results for input(s): LIPASE, AMYLASE in the last 168 hours. No results for input(s): AMMONIA in the last 168 hours. Coagulation Profile: No  results for input(s): INR, PROTIME in the last 168 hours. Cardiac Enzymes: No results for input(s): CKTOTAL, CKMB, CKMBINDEX, TROPONINI in the last 168 hours. BNP (last 3 results) No results for input(s): PROBNP in the last 8760 hours. HbA1C: No results for input(s): HGBA1C in the last 72 hours. CBG: Recent Labs  Lab 04/15/24 2353 04/16/24 1802 04/17/24 0047 04/17/24 0558 04/17/24 1223  GLUCAP 113* 114* 137* 135* 109*   Lipid Profile: No results for input(s): CHOL, HDL, LDLCALC, TRIG, CHOLHDL, LDLDIRECT in the last 72 hours. Thyroid Function Tests: No results for input(s): TSH, T4TOTAL, FREET4, T3FREE, THYROIDAB in the last 72 hours. Anemia Panel: No results for input(s): VITAMINB12, FOLATE, FERRITIN, TIBC, IRON, RETICCTPCT in the last 72 hours. Sepsis Labs: No results for input(s): PROCALCITON, LATICACIDVEN in the last 168 hours.  No results found for this or any previous visit (from the past 240 hours).       Radiology Studies: No results found.      Scheduled Meds:  aspirin   81 mg Per Tube Daily   atorvastatin   40 mg Per Tube q1800   diclofenac Sodium  2 g Topical QID   docusate  100 mg Per Tube BID   famotidine  20 mg Per Tube BID   free water  100 mL Per Tube Q6H   glycopyrrolate   1 mg Per Tube TID   heparin  injection (subcutaneous)  5,000 Units Subcutaneous Q8H   insulin  aspart  0-9  Units Subcutaneous Q6H   levETIRAcetam  500 mg Per Tube BID   methocarbamol   500 mg Per Tube Q8H   metoprolol  tartrate  37.5 mg Per Tube BID   mouth rinse  15 mL Mouth Rinse 4 times per day   polyethylene glycol  17 g Per Tube Daily   scopolamine  1 patch Transdermal Q72H   Continuous Infusions:  feeding supplement (OSMOLITE 1.2 CAL) 1,000 mL (04/17/24 0025)     LOS: 21 days    Time spent: 30 minutes spent on 04/17/2024 caring for this patient face-to-face including chart review, ordering labs/tests, documenting,  discussion with nursing staff, consultants, updating family and interview/physical exam    Camellia PARAS [place], DO [facility] Available via Epic secure chat 7am-7pm After these hours, please refer to coverage provider listed on [email or web address] 04/17/2024, 2:39 PM

## 2024-04-18 DIAGNOSIS — J392 Other diseases of pharynx: Secondary | ICD-10-CM | POA: Diagnosis not present

## 2024-04-18 LAB — GLUCOSE, CAPILLARY
Glucose-Capillary: 110 mg/dL — ABNORMAL HIGH (ref 70–99)
Glucose-Capillary: 122 mg/dL — ABNORMAL HIGH (ref 70–99)
Glucose-Capillary: 94 mg/dL (ref 70–99)
Glucose-Capillary: 118 mg/dL — ABNORMAL HIGH (ref 70–99)

## 2024-04-18 NOTE — Progress Notes (Signed)
 PROGRESS NOTE    PEIGHTON EDGIN  FMW:[id number] DOB: 1956/03/16 DOA: 03/27/2024 PCP: Trinidad Glisson, MD    Brief Narrative:   Stephen Bowen is a 68 y.o. male with past medical history significant for CAD/PAD, DM2, HTN, HLD, questionable dementia who presented to [facility] ED on 03/27/2024 for biopsy of pharyngeal mass and tracheostomy placement.  Patient underwent posterior pharyngeal mass biopsy with tracheostomy by ENT, Dr. Linell on 03/27/2024; with frozen section during case positive for SCC.  Patient was admitted to the intensive care unit/critical care service postoperatively.  Significant hospital events: 10/3: laryngoscopy, biopsy, tracheostomy; admitted to ICU/PCCM postoperatively  10/6: IR consulted for PEG placement>unable 2/2 anatomy, CCS consulted for gtube and underwent open gastrostomy tube placement by Dr. 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  Postoperatively admitted to the pulmonary critical care service/ICU.  Pathology consistent with squamous cell carcinoma.  Seen by medical oncology, Dr. Conchetta on 10/10; patient not interested in chemotherapy.  Radiation oncology was consulted with recommendation of 7 weeks of radiation therapy which patient has been noncompliant and now has been discontinued. -- Continue trach care, 6 L via trach collar -- Awaiting return to SNF, SNF will not accept until 30 days post tracheostomy placement -- Outpatient follow-up with medical/radiation oncology, if continues to refuse consider transition to comfort measures/hospice  Providencia/Klebsiella  tracheostomy infection Tracheostomy culture positive for Providencia/Klebsiella.  -- Scopolamine patch, glycopyrrolate  1 g per tube 3 times daily -- Frequent suctioning -- Continue ceftriaxone to complete 7-day course  Seizure disorder -- Keppra 500 mg per tube twice daily  HTN -- Metoprolol  tartrate 37.5 mg per tube BID  DM2 Hemoglobin C 5.9%. -- SSI for coverage -- CBGs q6h on tube feeds  CAD/PAD s/p bilateral AKA -- Aspirin  81 mg per tube daily -- Atorvastatin  40 mg per tube daily  Questionable dementia --Delirium precautions --Get up during the day --Encourage a familiar face to remain present throughout the day --Keep blinds open and lights on during daylight hours --Minimize the use of opioids/benzodiazepines  Adult failure to thrive  Solid food dysphagia secondary to squamous cell carcinoma pharynx Severe protein calorie malnutrition Nutrition Status: Nutrition Problem: Severe Malnutrition Etiology: chronic illness Signs/Symptoms: severe fat depletion, severe muscle depletion, percent weight loss Interventions: Refer to RD note for recommendations -- Surgical G-tube placed 10/6, continue tube feeds   DVT prophylaxis: heparin  injection 5,000 Units Start: 03/29/24 1515    Code Status: Limited: Do not attempt resuscitation (DNR) -DNR-LIMITED -Do Not Intubate/DNI  Family Communication:   Disposition Plan:  Level of care: Progressive Status is: Inpatient Remains inpatient appropriate because: Awaiting 30-day out time from tracheostomy placement for return to SNF    Consultants:  ENT, Dr. Linell [facility] General Surgery Interventional radiology Medical oncology, Dr. Conchetta Radiation oncology Palliative care  Procedures:  laryngoscopy, biopsy, tracheostomy; Dr. Linell 10/3 Surgical G-tube placement, Dr. Ebbie 10/6  Antimicrobials:  Cefazolin  10/6 - 10/6 Ceftriaxone 10/17 - 10/23   Subjective: Patient seen examined bedside, lying in bed.  Appears  comfortable.  No complaints, questions or concerns at this time.  Denies headache, no dizziness, no chest pain, no shortness of breath, no abdominal pain.  No acute events overnight per nursing staff.  Awaiting 30-day timeframe from placement of tracheostomy per SNF guidelines for return.  Objective: Vitals:   04/18/24 0640 04/18/24 0902 04/18/24 1148 04/18/24 1316  BP: 107/76   117/70  Pulse: 69 83 80 80  Resp: 18 19 (!) 22   Temp: 97.9 F (36.6 C)   97.8 F (36.6 C)  TempSrc: Oral   Oral  SpO2: 100% 100% 100% 100%  Weight:      Height:        Intake/Output Summary (Last 24 hours) at 04/18/2024 1514 Last data filed at 04/18/2024 1218 Gross per 24 hour  Intake 3820 ml  Output 700 ml  Net 3120 ml   Filed Weights   04/12/24 0459 04/13/24 0500 04/14/24 0500  Weight: 45.1 kg 44.3 kg 44.3 kg    Examination:  Physical Exam: GEN: NAD, alert, chronically ill in appearance appears older than stated age HEENT: NCAT, PERRL, EOMI, sclera clear, dry mucous membranes, tracheostomy noted in place with clear secretions PULM: CTAB w/o wheezes/crackles, normal respiratory effort, on 6 L trach collar CV: RRR GI: abd soft, NTND, + BS; G-tube noted MSK: Noted bilateral AKA    Data Reviewed: I have personally reviewed following labs and imaging studies  CBC: Recent Labs  Lab 04/15/24 0956  WBC 12.6*  NEUTROABS 9.3*  HGB 10.2*  HCT 32.6*  MCV 95.3  PLT 769*   Basic Metabolic Panel: Recent Labs  Lab 04/15/24 0956  NA 134*  K 4.7  CL 98  CO2 25  GLUCOSE 141*  BUN 19  CREATININE 0.65  CALCIUM  9.5  MG 2.2   GFR: Estimated Creatinine Clearance: 55.4 mL/min (by C-G formula based on SCr of 0.65 mg/dL). Liver Function Tests: Recent Labs  Lab 04/15/24 0956  AST 12*  ALT 35  ALKPHOS 85  BILITOT <0.2  PROT 6.6  ALBUMIN  3.3*   No results for input(s): LIPASE, AMYLASE in the last 168 hours. No results for input(s): AMMONIA in the last 168 hours. Coagulation  Profile: No results for input(s): INR, PROTIME in the last 168 hours. Cardiac Enzymes: No results for input(s): CKTOTAL, CKMB, CKMBINDEX, TROPONINI in the last 168 hours. BNP (last 3 results) No results for input(s): PROBNP in the last 8760 hours. HbA1C: No results for input(s): HGBA1C in the last 72 hours. CBG: Recent Labs  Lab 04/17/24 1223 04/17/24 1616 04/18/24 0120 04/18/24 0641 04/18/24 1216  GLUCAP 109* 136* 110* 122* 94   Lipid Profile: No results for input(s): CHOL, HDL, LDLCALC, TRIG, CHOLHDL, LDLDIRECT in the last 72 hours. Thyroid Function Tests: No results for input(s): TSH, T4TOTAL, FREET4, T3FREE, THYROIDAB in the last 72 hours. Anemia Panel: No results for input(s): VITAMINB12, FOLATE, FERRITIN, TIBC, IRON, RETICCTPCT in the last 72 hours. Sepsis Labs: No results for input(s): PROCALCITON, LATICACIDVEN in the last 168 hours.  No results found for this or any previous visit (from the past 240 hours).       Radiology Studies: No results found.      Scheduled Meds:  aspirin   81 mg Per Tube Daily   atorvastatin   40 mg Per Tube q1800   diclofenac Sodium  2 g Topical QID   docusate  100 mg Per Tube BID   famotidine  20 mg Per Tube BID   free water  100 mL Per Tube Q6H   glycopyrrolate   1 mg Per Tube TID   heparin  injection (subcutaneous)  5,000 Units Subcutaneous Q8H   insulin  aspart  0-9 Units Subcutaneous Q6H   levETIRAcetam  500 mg Per Tube BID   methocarbamol   500 mg Per Tube Q8H   metoprolol  tartrate  37.5 mg Per Tube BID   mouth rinse  15 mL Mouth Rinse 4 times per day   polyethylene glycol  17 g Per Tube Daily   scopolamine  1 patch Transdermal Q72H   Continuous Infusions:  feeding supplement (OSMOLITE 1.2 CAL) 1,000 mL (04/17/24 2200)     LOS: 22 days    Time spent: 30 minutes spent on 04/18/2024 caring for this patient face-to-face including chart review, ordering labs/tests,  documenting, discussion with nursing staff, consultants, updating family and interview/physical exam    Camellia PARAS Sarra Rachels, DO [facility] Available via Epic secure chat 7am-7pm After these hours, please refer to coverage provider listed on [email or web address] 04/18/2024, 3:14 PM

## 2024-04-19 DIAGNOSIS — J392 Other diseases of pharynx: Secondary | ICD-10-CM | POA: Diagnosis not present

## 2024-04-19 LAB — GLUCOSE, CAPILLARY
Glucose-Capillary: 111 mg/dL — ABNORMAL HIGH (ref 70–99)
Glucose-Capillary: 88 mg/dL (ref 70–99)
Glucose-Capillary: 97 mg/dL (ref 70–99)

## 2024-04-19 NOTE — Progress Notes (Signed)
   04/19/24 1645  Oxygen Therapy/Pulse Ox  O2 Device Tracheostomy Collar  SpO2 98 %  O2 Therapy (S)  Oxygen humidified (Changed new H20 bottle, attached drainage bag, pt transported from 4 W, room 1444 to 6 E, room 1614 on 28% ATC with full E cylinder, trach intact, secure, HOB trach management sheet with obturator @ the HOB. Ambu bag present, along with trach supplies.)  O2 Flow Rate (L/min) 6 L/min  FiO2 (%) 28 %  Equipment Changed Date 04/19/24   Suctioned moderate, thin, clear to pink tinged secretions, trach care performed. Continuous pulse ox @ bedside. Extra trachs @ bedside.

## 2024-04-19 NOTE — Progress Notes (Signed)
 Pt refusing all meds and interventions at this time. Charge nurse and on-call NP notified, will continue to offer meds/services as ordered

## 2024-04-19 NOTE — Progress Notes (Signed)
 PROGRESS NOTE    Stephen Bowen  FMW:[id number] DOB: 04-17-1956 DOA: 03/27/2024 PCP: Trinidad Glisson, MD    Brief Narrative:   Stephen Bowen is a 68 y.o. male with past medical history significant for CAD/PAD, DM2, HTN, HLD, questionable dementia who presented to [facility] ED on 03/27/2024 for biopsy of pharyngeal mass and tracheostomy placement.  Patient underwent posterior pharyngeal mass biopsy with tracheostomy by ENT, Dr. Linell on 03/27/2024; with frozen section during case positive for SCC.  Patient was admitted to the intensive care unit/critical care service postoperatively.  Significant hospital events: 10/3: laryngoscopy, biopsy, tracheostomy; admitted to ICU/PCCM postoperatively  10/6: IR consulted for PEG placement>unable 2/2 anatomy, CCS consulted for gtube and underwent open gastrostomy tube placement by Dr. 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  Postoperatively admitted to the pulmonary critical care service/ICU.  Pathology consistent with squamous cell carcinoma.  Seen by medical oncology, Dr. Conchetta on 10/10; patient not interested in chemotherapy.  Radiation oncology was consulted with recommendation of 7 weeks of radiation therapy which patient has been noncompliant and now has been discontinued. -- Continue trach care, 6 L via trach collar -- Awaiting return to SNF, SNF will not accept until 30 days post tracheostomy placement -- Outpatient follow-up with medical/radiation oncology, if continues to refuse consider transition to comfort measures/hospice  Providencia/Klebsiella  tracheostomy infection Tracheostomy culture positive for Providencia/Klebsiella.  -- Scopolamine patch, glycopyrrolate  1 g per tube 3 times daily -- Frequent suctioning -- Continue ceftriaxone to complete 7-day course  Seizure disorder -- Keppra 500 mg per tube twice daily  HTN -- Metoprolol  tartrate 37.5 mg per tube BID  DM2 Hemoglobin C 5.9%. -- SSI for coverage -- CBGs q6h on tube feeds  CAD/PAD s/p bilateral AKA -- Aspirin  81 mg per tube daily -- Atorvastatin  40 mg per tube daily  Questionable dementia --Delirium precautions --Get up during the day --Encourage a familiar face to remain present throughout the day --Keep blinds open and lights on during daylight hours --Minimize the use of opioids/benzodiazepines  Adult failure to thrive  Solid food dysphagia secondary to squamous cell carcinoma pharynx Severe protein calorie malnutrition Nutrition Status: Nutrition Problem: Severe Malnutrition Etiology: chronic illness Signs/Symptoms: severe fat depletion, severe muscle depletion, percent weight loss Interventions: Refer to RD note for recommendations -- Surgical G-tube placed 10/6, continue tube feeds   DVT prophylaxis: heparin  injection 5,000 Units Start: 03/29/24 1515    Code Status: Limited: Do not attempt resuscitation (DNR) -DNR-LIMITED -Do Not Intubate/DNI  Family Communication:   Disposition Plan:  Level of care: Med-Surg Status is: Inpatient Remains inpatient appropriate because: Awaiting 30-day out time from tracheostomy placement for return to SNF    Consultants:  ENT, Dr. Linell [facility] General Surgery Interventional radiology Medical oncology, Dr. Conchetta Radiation oncology Palliative care  Procedures:  laryngoscopy, biopsy, tracheostomy; Dr. Linell 10/3 Surgical G-tube placement, Dr. Ebbie 10/6  Antimicrobials:  Cefazolin  10/6 - 10/6 Ceftriaxone 10/17 - 10/23   Subjective: Patient seen examined bedside, lying in bed.  Appears  comfortable.  Not interested in any discussion this morning. No acute events overnight per nursing staff.  Awaiting 30-day timeframe from placement of tracheostomy per  SNF guidelines for return.  Objective: Vitals:   04/19/24 0412 04/19/24 0437 04/19/24 1200 04/19/24 1255  BP: 110/79   115/66  Pulse: 99   88  Resp: 12 18  18   Temp: 98.4 F (36.9 C)   98.4 F (36.9 C)  TempSrc: Oral   Oral  SpO2: 98%  97% 99%  Weight: 42.6 kg     Height:        Intake/Output Summary (Last 24 hours) at 04/19/2024 1408 Last data filed at 04/19/2024 1400 Gross per 24 hour  Intake 2060 ml  Output 700 ml  Net 1360 ml   Filed Weights   04/13/24 0500 04/14/24 0500 04/19/24 0412  Weight: 44.3 kg 44.3 kg 42.6 kg    Examination:  Physical Exam: GEN: NAD, alert, chronically ill in appearance appears older than stated age HEENT: NCAT, PERRL, EOMI, sclera clear, dry mucous membranes, tracheostomy noted in place with clear secretions PULM: CTAB w/o wheezes/crackles, normal respiratory effort, on 6 L trach collar CV: RRR GI: abd soft, NTND, + BS; G-tube noted MSK: Noted bilateral AKA    Data Reviewed: I have personally reviewed following labs and imaging studies  CBC: Recent Labs  Lab 04/15/24 0956  WBC 12.6*  NEUTROABS 9.3*  HGB 10.2*  HCT 32.6*  MCV 95.3  PLT 769*   Basic Metabolic Panel: Recent Labs  Lab 04/15/24 0956  NA 134*  K 4.7  CL 98  CO2 25  GLUCOSE 141*  BUN 19  CREATININE 0.65  CALCIUM  9.5  MG 2.2   GFR: Estimated Creatinine Clearance: 53.3 mL/min (by C-G formula based on SCr of 0.65 mg/dL). Liver Function Tests: Recent Labs  Lab 04/15/24 0956  AST 12*  ALT 35  ALKPHOS 85  BILITOT <0.2  PROT 6.6  ALBUMIN  3.3*   No results for input(s): LIPASE, AMYLASE in the last 168 hours. No results for input(s): AMMONIA in the last 168 hours. Coagulation Profile: No results for input(s): INR, PROTIME in the last 168 hours. Cardiac Enzymes: No results  for input(s): CKTOTAL, CKMB, CKMBINDEX, TROPONINI in the last 168 hours. BNP (last 3 results) No results for input(s): PROBNP in the last 8760 hours. HbA1C: No results for input(s): HGBA1C in the last 72 hours. CBG: Recent Labs  Lab 04/18/24 0641 04/18/24 1216 04/18/24 1819 04/19/24 0047 04/19/24 1113  GLUCAP 122* 94 118* 97 111*   Lipid Profile: No results for input(s): CHOL, HDL, LDLCALC, TRIG, CHOLHDL, LDLDIRECT in the last 72 hours. Thyroid Function Tests: No results for input(s): TSH, T4TOTAL, FREET4, T3FREE, THYROIDAB in the last 72 hours. Anemia Panel: No results for input(s): VITAMINB12, FOLATE, FERRITIN, TIBC, IRON, RETICCTPCT in the last 72 hours. Sepsis Labs: No results for input(s): PROCALCITON, LATICACIDVEN in the last 168 hours.  No results found for this or any previous visit (from the past 240 hours).       Radiology Studies: No results found.      Scheduled Meds:  aspirin   81 mg Per Tube Daily   atorvastatin   40 mg Per Tube q1800   diclofenac Sodium  2 g Topical QID   docusate  100 mg Per Tube BID   famotidine  20 mg Per Tube BID   free water  100 mL Per Tube Q6H   glycopyrrolate   1 mg Per Tube TID   heparin  injection (subcutaneous)  5,000 Units Subcutaneous Q8H   insulin  aspart  0-9 Units Subcutaneous Q6H   levETIRAcetam  500 mg Per Tube BID   methocarbamol   500 mg Per Tube Q8H   metoprolol  tartrate  37.5 mg Per Tube BID   mouth rinse  15 mL Mouth Rinse 4 times per day   polyethylene glycol  17 g Per Tube Daily   scopolamine  1 patch Transdermal Q72H   Continuous Infusions:  feeding supplement (OSMOLITE 1.2 CAL) 1,000 mL (04/18/24 1556)     LOS: 23 days    Time spent: 30 minutes spent on 04/19/2024 caring for this patient face-to-face including chart review, ordering labs/tests, documenting, discussion with nursing staff, consultants, updating family and interview/physical exam    Camellia PARAS Ezreal Turay, DO [facility] Available via Epic secure chat 7am-7pm After these hours, please refer to coverage provider listed on [email or web address] 04/19/2024, 2:08 PM

## 2024-04-20 ENCOUNTER — Ambulatory Visit

## 2024-04-20 DIAGNOSIS — J392 Other diseases of pharynx: Secondary | ICD-10-CM | POA: Diagnosis not present

## 2024-04-20 LAB — GLUCOSE, CAPILLARY
Glucose-Capillary: 106 mg/dL — ABNORMAL HIGH (ref 70–99)
Glucose-Capillary: 106 mg/dL — ABNORMAL HIGH (ref 70–99)
Glucose-Capillary: 130 mg/dL — ABNORMAL HIGH (ref 70–99)

## 2024-04-20 MED ORDER — OSMOLITE 1.5 CAL PO LIQD
1000.0000 mL | ORAL | Status: DC
  Administered 2024-04-20 – 2024-04-27 (×6): 1000 mL
  Filled 2024-04-20 (×11): qty 1000

## 2024-04-20 MED ORDER — FREE WATER
125.0000 mL | Freq: Four times a day (QID) | Status: DC
  Administered 2024-04-20 – 2024-04-28 (×28): 125 mL

## 2024-04-20 NOTE — Progress Notes (Deleted)
 Pt refused FSBS check. Pt educated and providers notified.

## 2024-04-20 NOTE — Progress Notes (Signed)
 Patient has refused vitals, meds, and all attempted interventions at this time. MD notified, patient educated. Care continues

## 2024-04-20 NOTE — Progress Notes (Signed)
 Patient refused all afternoon meds - including red med heparin  - educated patient on the risks of refusing all meds. Patient stated he does not want to take anymore more medications because per patient, He is dying. Physician was notified at this time.

## 2024-04-20 NOTE — Progress Notes (Signed)
 Patient refused for me to clean up his trach/suction or anything. Swats me away. RN aware.

## 2024-04-20 NOTE — Plan of Care (Signed)
  Problem: Education: Goal: Knowledge of General Education information will improve Description: Including pain rating scale, medication(s)/side effects and non-pharmacologic comfort measures Outcome: Progressing   Problem: Health Behavior/Discharge Planning: Goal: Ability to manage health-related needs will improve Outcome: Progressing   Problem: Clinical Measurements: Goal: Ability to maintain clinical measurements within normal limits will improve Outcome: Progressing Goal: Will remain free from infection Outcome: Progressing Goal: Diagnostic test results will improve Outcome: Progressing Goal: Respiratory complications will improve Outcome: Progressing Goal: Cardiovascular complication will be avoided Outcome: Progressing   Problem: Activity: Goal: Risk for activity intolerance will decrease Outcome: Progressing   Problem: Nutrition: Goal: Adequate nutrition will be maintained Outcome: Progressing   Problem: Coping: Goal: Level of anxiety will decrease Outcome: Progressing   Problem: Elimination: Goal: Will not experience complications related to bowel motility Outcome: Progressing Goal: Will not experience complications related to urinary retention Outcome: Progressing   Problem: Pain Managment: Goal: General experience of comfort will improve and/or be controlled Outcome: Progressing   Problem: Safety: Goal: Ability to remain free from injury will improve Outcome: Progressing   Problem: Skin Integrity: Goal: Risk for impaired skin integrity will decrease Outcome: Progressing   Problem: Education: Goal: Knowledge about tracheostomy care/management will improve Outcome: Progressing   Problem: Activity: Goal: Ability to tolerate increased activity will improve Outcome: Progressing   Problem: Health Behavior/Discharge Planning: Goal: Ability to manage tracheostomy will improve Outcome: Progressing   Problem: Respiratory: Goal: Patent airway  maintenance will improve Outcome: Progressing   Problem: Role Relationship: Goal: Ability to communicate will improve Outcome: Progressing   Problem: Education: Goal: Ability to describe self-care measures that may prevent or decrease complications (Diabetes Survival Skills Education) will improve Outcome: Progressing Goal: Individualized Educational Video(s) Outcome: Progressing   Problem: Coping: Goal: Ability to adjust to condition or change in health will improve Outcome: Progressing   Problem: Fluid Volume: Goal: Ability to maintain a balanced intake and output will improve Outcome: Progressing   Problem: Health Behavior/Discharge Planning: Goal: Ability to identify and utilize available resources and services will improve Outcome: Progressing Goal: Ability to manage health-related needs will improve Outcome: Progressing   Problem: Metabolic: Goal: Ability to maintain appropriate glucose levels will improve Outcome: Progressing   Problem: Nutritional: Goal: Maintenance of adequate nutrition will improve Outcome: Progressing Goal: Progress toward achieving an optimal weight will improve Outcome: Progressing   Problem: Skin Integrity: Goal: Risk for impaired skin integrity will decrease Outcome: Progressing   Problem: Tissue Perfusion: Goal: Adequacy of tissue perfusion will improve Outcome: Progressing   Problem: Activity: Goal: Ability to tolerate increased activity will improve Outcome: Progressing   Problem: Respiratory: Goal: Ability to maintain a clear airway and adequate ventilation will improve Outcome: Progressing   Problem: Role Relationship: Goal: Method of communication will improve Outcome: Progressing

## 2024-04-20 NOTE — Plan of Care (Signed)
  Problem: Clinical Measurements: Goal: Ability to maintain clinical measurements within normal limits will improve 04/20/2024 1624 by Jerrye Lacinda HERO, RN Outcome: Progressing   Problem: Nutrition: Goal: Adequate nutrition will be maintained 04/20/2024 1624 by Jerrye Lacinda HERO, RN Outcome: Progressing   Problem: Elimination: Goal: Will not experience complications related to urinary retention Outcome: Progressing   Problem: Safety: Goal: Ability to remain free from injury will improve Outcome: Progressing

## 2024-04-20 NOTE — Progress Notes (Signed)
 Nutrition Follow-up  DOCUMENTATION CODES:   Severe malnutrition in context of chronic illness  INTERVENTION:  - New tube feeding via PEG:  Osmolite 1.5 at 50 ml/h (1200 ml per day)  +174mL Q6H FWF (500 ml per day) Provides 1800 kcal, 75 gm protein, 1414 ml free water daily    - Continue daily weights  NUTRITION DIAGNOSIS:   Severe Malnutrition related to chronic illness as evidenced by severe fat depletion, severe muscle depletion, percent weight loss. *ongoing  GOAL:   Patient will meet greater than or equal to 90% of their needs *met with TF  MONITOR:   PO intake, Diet advancement, Supplement acceptance, Skin, TF tolerance, Weight trends, Labs  REASON FOR ASSESSMENT:   Consult Enteral/tube feeding initiation and management  ASSESSMENT:   Pt with PMH significant for: HTN, PAD, bilateral AKAs, CAD, HLD, diabetes mellitus, MI, dementia, CKD II. Admitted for tracheostomy placement and biopsy of pharyngeal mass. Has been with prolonged difficulty swallowing and some hemoptysis.  10/03 - admitted; tracheostomy placed; laryngoscopy and biopsy 10/04 - transitioned to trach collar 10/05 - transferred out of ICU 10/06 - PEG placed 10/7 - TF initiated 10/9 - Transferred to [facility] for initiation of radiation therapy   Patient continues to refuse care, now refusing radiation treatment. Refused to talk with MD this AM.   Patient has remained on goal TF with no noted intolerances. Unfortunately, his weight appears to be decreasing or at the very least has not been increasing. Will increase tube feeds slightly to support increased nutrient needs with ongoing malnutrition.    Palliative care following patient. GOC discussions ongoing. Patient now awaiting return to SNF but has to be 30 days out from trach placement, which will be on 11/3.     Admit Weight: 94# Current Weight: 89# (was 93.9# yesterday) I&O's: +7.7L since 10/13   Medications reviewed and include: Colace, Miralax ,  100mL Q6H FWF   Labs reviewed:  No BMP since 10/22  Diet Order:   Diet Order             Diet NPO time specified  Diet effective now                   EDUCATION NEEDS:  Not appropriate for education at this time  Skin:  Skin Assessment: Skin Integrity Issues: Skin Integrity Issues:: Stage I Stage I: coccyx  Last BM:  10/24 - type 5  Height:  Ht Readings from Last 1 Encounters:  03/30/24 5' 6 (1.676 m)   Weight:  Wt Readings from Last 1 Encounters:  04/20/24 40.4 kg   Ideal Body Weight:  43.8 kg  BMI:  Body mass index is 14.38 kg/m.  Estimated Nutritional Needs:  Kcal:  1500-1700 kcals Protein:  70-85g Fluid:  1.5-1.7L/day    Trude Ned RD, LDN Contact via Secure Chat.

## 2024-04-20 NOTE — Progress Notes (Signed)
 PROGRESS NOTE    Stephen Bowen  FMW:[id number] DOB: 01-Mar-1956 DOA: 03/27/2024 PCP: Trinidad Glisson, MD    Brief Narrative:   Stephen Bowen is a 68 y.o. male with past medical history significant for CAD/PAD, DM2, HTN, HLD, questionable dementia who presented to [facility] ED on 03/27/2024 for biopsy of pharyngeal mass and tracheostomy placement.  Patient underwent posterior pharyngeal mass biopsy with tracheostomy by ENT, Dr. Linell on 03/27/2024; with frozen section during case positive for SCC.  Patient was admitted to the intensive care unit/critical care service postoperatively.  Significant hospital events: 10/3: laryngoscopy, biopsy, tracheostomy; admitted to ICU/PCCM postoperatively  10/6: IR consulted for PEG placement>unable 2/2 anatomy, CCS consulted for gtube and underwent open gastrostomy tube placement by Dr. 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  Postoperatively admitted to the pulmonary critical care service/ICU.  Pathology consistent with squamous cell carcinoma.  Seen by medical oncology, Dr. Conchetta on 10/10; patient not interested in chemotherapy.  Radiation oncology was consulted with recommendation of 7 weeks of radiation therapy which patient has been noncompliant and now has been discontinued. -- Continue trach care, 6 L via trach collar -- Awaiting return to SNF, SNF will not accept until 30 days post tracheostomy placement -- Outpatient follow-up with medical/radiation oncology, if continues to refuse consider transition to comfort measures/hospice  Providencia/Klebsiella  tracheostomy infection Tracheostomy culture positive for Providencia/Klebsiella.  -- Scopolamine patch, glycopyrrolate  1 g per tube 3 times daily -- Frequent suctioning -- Continue ceftriaxone to complete 7-day course  Seizure disorder -- Keppra 500 mg per tube twice daily  HTN -- Metoprolol  tartrate 37.5 mg per tube BID  DM2 Hemoglobin C 5.9%. -- SSI for coverage -- CBGs q6h on tube feeds  CAD/PAD s/p bilateral AKA -- Atorvastatin  40 mg per tube daily  Questionable dementia --Delirium precautions --Get up during the day --Encourage a familiar face to remain present throughout the day --Keep blinds open and lights on during daylight hours --Minimize the use of opioids/benzodiazepines  Adult failure to thrive  Solid food dysphagia secondary to squamous cell carcinoma pharynx Severe protein calorie malnutrition Nutrition Status: Nutrition Problem: Severe Malnutrition Etiology: chronic illness Signs/Symptoms: severe fat depletion, severe muscle depletion, percent weight loss Interventions: Refer to RD note for recommendations -- Surgical G-tube placed 10/6, continue tube feeds   DVT prophylaxis: heparin  injection 5,000 Units Start: 03/29/24 1515    Code Status: Limited: Do not attempt resuscitation (DNR) -DNR-LIMITED -Do Not Intubate/DNI  Family Communication:   Disposition Plan:  Level of care: Med-Surg Status is: Inpatient Remains inpatient appropriate because: Awaiting 30-day out time from tracheostomy placement for return to SNF    Consultants:  ENT, Dr. Linell [facility] General Surgery Interventional radiology Medical oncology, Dr. Conchetta Radiation oncology Palliative care  Procedures:  laryngoscopy, biopsy, tracheostomy; Dr. Linell 10/3 Surgical G-tube placement, Dr. Ebbie 10/6  Antimicrobials:  Cefazolin  10/6 - 10/6 Ceftriaxone 10/17 - 10/23   Subjective: Patient seen examined bedside, lying in bed.  Appears comfortable.  Not interested in any  discussion this morning, waiting his hands at me to leave the room. No acute events overnight per nursing staff.  Awaiting 30-day timeframe from placement of  tracheostomy per SNF guidelines for return.  Objective: Vitals:   04/20/24 0414 04/20/24 0607 04/20/24 0825 04/20/24 1059  BP:  113/75  108/73  Pulse:  95 94 100  Resp:  18 18   Temp:  98.3 F (36.8 C)    TempSrc:      SpO2: 97% 96% 96%   Weight:  40.4 kg    Height:        Intake/Output Summary (Last 24 hours) at 04/20/2024 1215 Last data filed at 04/20/2024 1117 Gross per 24 hour  Intake 1524 ml  Output 350 ml  Net 1174 ml   Filed Weights   04/14/24 0500 04/19/24 0412 04/20/24 0607  Weight: 44.3 kg 42.6 kg 40.4 kg    Examination:  Physical Exam: GEN: NAD, alert, chronically ill in appearance appears older than stated age HEENT: NCAT, PERRL, EOMI, sclera clear, dry mucous membranes, tracheostomy noted in place with clear secretions PULM: CTAB w/o wheezes/crackles, normal respiratory effort, on 6 L trach collar CV: RRR GI: abd soft, NTND, + BS; G-tube noted MSK: Noted bilateral AKA    Data Reviewed: I have personally reviewed following labs and imaging studies  CBC: Recent Labs  Lab 04/15/24 0956  WBC 12.6*  NEUTROABS 9.3*  HGB 10.2*  HCT 32.6*  MCV 95.3  PLT 769*   Basic Metabolic Panel: Recent Labs  Lab 04/15/24 0956  NA 134*  K 4.7  CL 98  CO2 25  GLUCOSE 141*  BUN 19  CREATININE 0.65  CALCIUM  9.5  MG 2.2   GFR: Estimated Creatinine Clearance: 50.5 mL/min (by C-G formula based on SCr of 0.65 mg/dL). Liver Function Tests: Recent Labs  Lab 04/15/24 0956  AST 12*  ALT 35  ALKPHOS 85  BILITOT <0.2  PROT 6.6  ALBUMIN  3.3*   No results for input(s): LIPASE, AMYLASE in the last 168 hours. No results for input(s): AMMONIA in the last 168 hours. Coagulation Profile: No results for input(s): INR, PROTIME in the last 168 hours. Cardiac Enzymes: No results for input(s):  CKTOTAL, CKMB, CKMBINDEX, TROPONINI in the last 168 hours. BNP (last 3 results) No results for input(s): PROBNP in the last 8760 hours. HbA1C: No results for input(s): HGBA1C in the last 72 hours. CBG: Recent Labs  Lab 04/19/24 1113 04/19/24 1716 04/20/24 0008 04/20/24 0546 04/20/24 1158  GLUCAP 111* 88 106* 106* 130*   Lipid Profile: No results for input(s): CHOL, HDL, LDLCALC, TRIG, CHOLHDL, LDLDIRECT in the last 72 hours. Thyroid Function Tests: No results for input(s): TSH, T4TOTAL, FREET4, T3FREE, THYROIDAB in the last 72 hours. Anemia Panel: No results for input(s): VITAMINB12, FOLATE, FERRITIN, TIBC, IRON, RETICCTPCT in the last 72 hours. Sepsis Labs: No results for input(s): PROCALCITON, LATICACIDVEN in the last 168 hours.  No results found for this or any previous visit (from the past 240 hours).       Radiology Studies: No results found.      Scheduled Meds:  atorvastatin   40 mg Per Tube q1800   diclofenac Sodium  2 g Topical QID   docusate  100 mg Per Tube BID   famotidine  20 mg Per Tube BID   free water  100 mL Per Tube Q6H   glycopyrrolate   1 mg Per Tube TID   heparin  injection (subcutaneous)  5,000 Units Subcutaneous Q8H   levETIRAcetam  500 mg Per Tube BID   methocarbamol   500 mg Per Tube Q8H   metoprolol  tartrate  37.5 mg Per Tube BID   mouth  rinse  15 mL Mouth Rinse 4 times per day   polyethylene glycol  17 g Per Tube Daily   scopolamine  1 patch Transdermal Q72H   Continuous Infusions:  feeding supplement (OSMOLITE 1.2 CAL) 1,000 mL (04/18/24 1556)     LOS: 24 days    Time spent: 30 minutes spent on 04/20/2024 caring for this patient face-to-face including chart review, ordering labs/tests, documenting, discussion with nursing staff, consultants, updating family and interview/physical exam    Camellia PARAS Carlina Derks, DO [facility] Available via Epic secure chat 7am-7pm After these  hours, please refer to coverage provider listed on [email or web address] 04/20/2024, 12:15 PM

## 2024-04-21 ENCOUNTER — Telehealth (HOSPITAL_COMMUNITY): Payer: Self-pay | Admitting: Otolaryngology

## 2024-04-21 ENCOUNTER — Ambulatory Visit

## 2024-04-21 DIAGNOSIS — J392 Other diseases of pharynx: Secondary | ICD-10-CM | POA: Diagnosis not present

## 2024-04-21 DIAGNOSIS — Z515 Encounter for palliative care: Secondary | ICD-10-CM | POA: Diagnosis not present

## 2024-04-21 DIAGNOSIS — C140 Malignant neoplasm of pharynx, unspecified: Secondary | ICD-10-CM | POA: Diagnosis not present

## 2024-04-21 DIAGNOSIS — Z7189 Other specified counseling: Secondary | ICD-10-CM | POA: Diagnosis not present

## 2024-04-21 NOTE — Plan of Care (Signed)
  Problem: Education: Goal: Knowledge of General Education information will improve Description: Including pain rating scale, medication(s)/side effects and non-pharmacologic comfort measures Outcome: Progressing   Problem: Health Behavior/Discharge Planning: Goal: Ability to manage health-related needs will improve Outcome: Progressing   Problem: Clinical Measurements: Goal: Ability to maintain clinical measurements within normal limits will improve Outcome: Progressing Goal: Will remain free from infection Outcome: Progressing Goal: Diagnostic test results will improve Outcome: Progressing Goal: Respiratory complications will improve Outcome: Progressing Goal: Cardiovascular complication will be avoided Outcome: Progressing   Problem: Activity: Goal: Risk for activity intolerance will decrease Outcome: Progressing   Problem: Nutrition: Goal: Adequate nutrition will be maintained Outcome: Progressing   Problem: Coping: Goal: Level of anxiety will decrease Outcome: Progressing   Problem: Elimination: Goal: Will not experience complications related to bowel motility Outcome: Progressing Goal: Will not experience complications related to urinary retention Outcome: Progressing   Problem: Pain Managment: Goal: General experience of comfort will improve and/or be controlled Outcome: Progressing   Problem: Safety: Goal: Ability to remain free from injury will improve Outcome: Progressing   Problem: Skin Integrity: Goal: Risk for impaired skin integrity will decrease Outcome: Progressing   Problem: Health Behavior/Discharge Planning: Goal: Ability to manage tracheostomy will improve Outcome: Progressing   Problem: Respiratory: Goal: Patent airway maintenance will improve Outcome: Progressing   Problem: Education: Goal: Ability to describe self-care measures that may prevent or decrease complications (Diabetes Survival Skills Education) will improve Outcome:  Progressing Goal: Individualized Educational Video(s) Outcome: Progressing   Problem: Fluid Volume: Goal: Ability to maintain a balanced intake and output will improve Outcome: Progressing   Problem: Health Behavior/Discharge Planning: Goal: Ability to identify and utilize available resources and services will improve Outcome: Progressing Goal: Ability to manage health-related needs will improve Outcome: Progressing   Problem: Metabolic: Goal: Ability to maintain appropriate glucose levels will improve Outcome: Progressing   Problem: Nutritional: Goal: Maintenance of adequate nutrition will improve Outcome: Progressing Goal: Progress toward achieving an optimal weight will improve Outcome: Progressing   Problem: Skin Integrity: Goal: Risk for impaired skin integrity will decrease Outcome: Progressing   Problem: Tissue Perfusion: Goal: Adequacy of tissue perfusion will improve Outcome: Progressing   Problem: Activity: Goal: Ability to tolerate increased activity will improve Outcome: Progressing   Problem: Respiratory: Goal: Ability to maintain a clear airway and adequate ventilation will improve Outcome: Progressing   Problem: Role Relationship: Goal: Method of communication will improve Outcome: Progressing

## 2024-04-21 NOTE — Plan of Care (Signed)

## 2024-04-21 NOTE — Progress Notes (Signed)
 Daily Progress Note   Patient Name: Stephen Bowen       Date: 04/21/2024 DOB: February 01, 1956  Age: 68 y.o. MRN#: [id number] Attending Physician: Austria, Eric J, DO Primary Care Physician: Trinidad Glisson, MD Admit Date: 03/27/2024 Length of Stay: 25 days  Reason for Consultation/Follow-up: Establishing goals of care  Subjective:   Reviewed EMR including personal review of pertinent labs, imaging, and daily care notes.  Reviewed recent documentation from hospitalist, TOC, and bedside care team.  Discussed with TOC and plan is for patient return to LTC SNF once trach has been in place for 30 days.  This will occur on November 3.  I saw and examined Stephen Bowen today.  He was lying in bed in no distress at time of my arrival.  At times he would speak with me, but other times he would become unengaged and wave me off.  We reviewed his clinical course and the fact that he has newly diagnosed squamous cell cancer of the oropharynx.  We reviewed his frustration with current care situation and needing to remain hospitalized due to new trach.  We discussed desire not to undergo further interventions for cancer. Discussed that the hospital can be useful as long as he is getting well enough from care he receives at the hospital to enjoy his time outside of the hospital, but there is going to come a time in the near future where, if his goal is to avoid aggressive interventions or treatment for his cancer, he may be better served to plan on being out of the hospital and bringing care to him there rather repeated trips to the hospital. We discussed hospice as a tool that may be beneficial in this goal as he has reached a point where we are trying to fix problems that are not fixable.  Following conversation, he shrugs and waits for me to leave.  He was agreeable to me calling his sister to discuss.  Attempted to call his sister to discuss further.  I was unable to reach her and left a voicemail requesting  return call.   Objective:   Vital Signs:  BP 109/69 (BP Location: Left Arm)   Pulse 88   Temp 97.6 F (36.4 C) (Oral)   Resp 16   Ht 5' 6 (1.676 m)   Wt 40.4 kg   SpO2 98%   BMI 14.38 kg/m   Physical Exam: General: Frail and chronically ill-appearing, no distress  cardiovascular: Regular rate and rhythm Respiratory: Trach collar in place, no increased work of breathing, lungs diminished extremities: Bilateral BKA Neuro: Awake and alert.  Tracks and follows commands.  Assessment & Plan:   Assessment: Patient is a 68 year old male with a past medical history of squamous cell carcinoma of the pharynx status post tracheostomy, failure to thrive  status post PEG tube placement, severe protein calorie malnutrition, PAD, CAD, history of CVA, history of tobacco use, history of alcohol use, diabetes mellitus type 2, seizure disorder, dementia, bilateral BKA and long-term care resident [facility] in [place] who was admitted on 03/27/2024 after undergoing elective pharyngeal mass biopsy that unfortunately began to ooze requiring cauterization and due to the risk of recurrent bleeding after extubation, decision was made to proceed with tracheostomy placement.  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  Oncology and radiation oncology consulted for recommendations and evaluation.  Palliative medicine team consulted to assist with complex medical decision making.   Recommendations/Plan: # Complex medical decision making/goals of  care:   - Stephen Bowen has continued to refuse care at times throughout the day.  He is not interested in further radiation.  Plan has been for patient to return to long-term care at SNF once trach has been in place for 30 days.  I received a call to come and evaluate him, however, because he has been refusing more care and reported that he was ready to die.  I met with him to discuss this today.  He was back-and-forth  with his openness to discuss.  Overall, he reports being frustrated with the fact he is stuck in the hospital with no immediate plan for discharge.  We reviewed all the medical information from this hospitalization and discussed his care plan moving forward.  Discussed eventual plan for return to skilled facility but he acts confused by this and tells me he was living at home.  Patient's sister, Stephen Bowen is reported to be his HCPOA and I called her to discuss today.  I left a voicemail requesting a return call.  TOC is assisting with discharge planning.  If forced to return to skilled facility without plan for further aggressive treatment of squamous cell cancer of the head and neck, he may be best served by enrollment in hospice services at time of discharge.  Will discuss further with his sister whenever she returns call.              -  Code Status: Limited: Do not attempt resuscitation (DNR) -DNR-LIMITED -Do Not Intubate/DNI     # Symptom management   - Secretions   - Continue scopolamine 1 mg transdermal every 72 hours   - Continue glycopyrrolate  tablet 1 mg 3 times daily via PEG   - Pain   - Continue oxycodone  5 mg every 4 hours as needed  # Psycho-social/Spiritual Support:  - Support System: Sister-Stephen Bowen noted she is patient's Hotel Manager   # Discharge Planning: Return to LTC SNF; [facility] assisting with discharge coordination   Thank you for allowing the palliative care team to participate in the care Stephen Bowen.  Stephen Bowen [facility] Provider PMT # [phone]  If patient remains symptomatic despite maximum doses, please call PMT at [phone] between 0700 and 1900. Outside of these hours, please call attending, as PMT does not have night coverage.   I personally spent a total of 55 minutes in the care of the patient today including preparing to see the patient, getting/reviewing separately obtained history, performing a medically appropriate exam/evaluation,  counseling and educating, documenting clinical information in the EHR, and independently interpreting results.

## 2024-04-21 NOTE — Progress Notes (Signed)
 Patient declined administration of scheduled medications and refused assessment of vital signs. Tracheostomy suctioning was offered; patient declined, stating, It doesn't help. Patient verbalized a desire to rest and requested not to be disturbed

## 2024-04-21 NOTE — Telephone Encounter (Signed)
 OP MBSS order originally ordered by Dr. Soldatova, now attached to Dr. Tobie for latest update. Unable to reach scheduler to arrange swallow test. [facility] 9/16, scheduler unavailable, 9/23 left message for scheduler, 9/30 deferred 1 week post surgery, 10/10 left message for scheduler. Never received a call back from [facility] scheduler. Saw in pt's chart he was admitted to [facility] 03/27/24. (AHARRIS)

## 2024-04-21 NOTE — Progress Notes (Addendum)
 PROGRESS NOTE    Stephen Bowen  FMW:[id number] DOB: [birth year] DOA: 03/27/2024 PCP: Trinidad Glisson, MD    Brief Narrative:   Stephen Bowen is a 68 y.o. male with past medical history significant for CAD/PAD, DM2, HTN, HLD, questionable dementia who presented to [facility] ED on 03/27/2024 for biopsy of pharyngeal mass and tracheostomy placement.  Patient underwent posterior pharyngeal mass biopsy with tracheostomy by ENT, Dr. Linell on 03/27/2024; with frozen section during case positive for SCC.  Patient was admitted to the intensive care unit/critical care service postoperatively.  Significant hospital events: 10/3: laryngoscopy, biopsy, tracheostomy; admitted to ICU/PCCM postoperatively  10/6: IR consulted for PEG placement>unable 2/2 anatomy, CCS consulted for gtube and underwent open gastrostomy tube placement by Dr. Ebbie; transferred to [facility] 10/9: Transferred to [facility] 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  Postoperatively admitted to the pulmonary critical care service/ICU.  Pathology consistent with squamous cell carcinoma.  Seen by medical oncology, Dr. Conchetta on 10/10; patient not interested in chemotherapy.  Radiation oncology was consulted with recommendation of 7 weeks of radiation therapy which patient has been noncompliant and now has been discontinued. -- Continue trach care, 6 L via trach collar -- Awaiting return to SNF, SNF will not accept until 30 days post tracheostomy placement -- Outpatient follow-up with medical/radiation oncology, if continues to refuse consider transition to comfort measures/hospice  Providencia/Klebsiella  tracheostomy infection Tracheostomy culture positive for Providencia/Klebsiella.  -- Scopolamine patch, glycopyrrolate  1 g per tube 3 times daily -- Frequent suctioning -- Continue ceftriaxone to complete 7-day course  Seizure disorder -- Keppra 500 mg per tube twice daily  HTN -- Metoprolol  tartrate 37.5 mg per tube BID  DM2 Hemoglobin C 5.9%. -- SSI for coverage -- CBGs q6h on tube feeds  CAD/PAD s/p bilateral AKA -- Atorvastatin  40 mg per tube daily  Questionable dementia --Delirium precautions --Get up during the day --Encourage a familiar face to remain present throughout the day --Keep blinds open and lights on during daylight hours --Minimize the use of opioids/benzodiazepines  Adult failure to thrive  Solid food dysphagia secondary to squamous cell carcinoma pharynx Severe protein calorie malnutrition Nutrition Status: Nutrition Problem: Severe Malnutrition Etiology: chronic illness Signs/Symptoms: severe fat depletion, severe muscle depletion, percent weight loss Interventions: Refer to RD note for recommendations -- Surgical G-tube placed 10/6, continue tube feeds   DVT prophylaxis: Patient refusing to take heparin  injection    Code Status: Limited: Do not attempt resuscitation (DNR) -DNR-LIMITED -Do Not Intubate/DNI  Family Communication: No family present at bedside  Disposition Plan:  Level of care: Med-Surg Status is: Inpatient Remains inpatient appropriate because: Awaiting 30-day out time from tracheostomy placement for return to SNF    Consultants:  ENT, Dr. Linell [facility] General Surgery Interventional radiology Medical oncology, Dr. Conchetta Radiation oncology Palliative care  Procedures:  laryngoscopy, biopsy, tracheostomy; Dr. Linell 10/3 Surgical G-tube placement, Dr. Ebbie 10/6  Antimicrobials:  Cefazolin  10/6 - 10/6 Ceftriaxone 10/17 - 10/23   Subjective: Patient seen examined bedside, lying in bed.  Appears comfortable.   Currently receiving neb treatment.  Continues with refusal of most medical interventions/medications. No acute events overnight per nursing staff.  Awaiting 30-day timeframe from placement of tracheostomy  per SNF guidelines for return.  Objective: Vitals:   04/20/24 2113 04/21/24 0100 04/21/24 0740 04/21/24 1020  BP:    119/80  Pulse: (!) 102  (!) 101 (!) 104  Resp:  18 18 18   Temp:    98.4 F (36.9 C)  TempSrc:    Oral  SpO2:  96% 95% 98%  Weight:      Height:        Intake/Output Summary (Last 24 hours) at 04/21/2024 1143 Last data filed at 04/21/2024 0539 Gross per 24 hour  Intake 1044.17 ml  Output 450 ml  Net 594.17 ml   Filed Weights   04/14/24 0500 04/19/24 0412 04/20/24 0607  Weight: 44.3 kg 42.6 kg 40.4 kg    Examination:  Physical Exam: GEN: NAD, alert, chronically ill in appearance appears older than stated age HEENT: NCAT, PERRL, EOMI, sclera clear, dry mucous membranes, tracheostomy noted in place with clear secretions PULM: CTAB w/o wheezes/crackles, normal respiratory effort, on 6 L trach collar CV: RRR GI: abd soft, NTND, + BS; G-tube noted MSK: Noted bilateral AKA    Data Reviewed: I have personally reviewed following labs and imaging studies  CBC: Recent Labs  Lab 04/15/24 0956  WBC 12.6*  NEUTROABS 9.3*  HGB 10.2*  HCT 32.6*  MCV 95.3  PLT 769*   Basic Metabolic Panel: Recent Labs  Lab 04/15/24 0956  NA 134*  K 4.7  CL 98  CO2 25  GLUCOSE 141*  BUN 19  CREATININE 0.65  CALCIUM  9.5  MG 2.2   GFR: Estimated Creatinine Clearance: 50.5 mL/min (by C-G formula based on SCr of 0.65 mg/dL). Liver Function Tests: Recent Labs  Lab 04/15/24 0956  AST 12*  ALT 35  ALKPHOS 85  BILITOT <0.2  PROT 6.6  ALBUMIN  3.3*   No results for input(s): LIPASE, AMYLASE in the last 168 hours. No results for input(s): AMMONIA in the last 168 hours. Coagulation Profile: No results for input(s): INR, PROTIME in the last 168  hours. Cardiac Enzymes: No results for input(s): CKTOTAL, CKMB, CKMBINDEX, TROPONINI in the last 168 hours. BNP (last 3 results) No results for input(s): PROBNP in the last 8760 hours. HbA1C: No results for input(s): HGBA1C in the last 72 hours. CBG: Recent Labs  Lab 04/19/24 1113 04/19/24 1716 04/20/24 0008 04/20/24 0546 04/20/24 1158  GLUCAP 111* 88 106* 106* 130*   Lipid Profile: No results for input(s): CHOL, HDL, LDLCALC, TRIG, CHOLHDL, LDLDIRECT in the last 72 hours. Thyroid Function Tests: No results for input(s): TSH, T4TOTAL, FREET4, T3FREE, THYROIDAB in the last 72 hours. Anemia Panel: No results for input(s): VITAMINB12, FOLATE, FERRITIN, TIBC, IRON, RETICCTPCT in the last 72 hours. Sepsis Labs: No results for input(s): PROCALCITON, LATICACIDVEN in the last 168 hours.  No results found for this or any previous visit (from the past 240 hours).       Radiology Studies: No results found.      Scheduled Meds:  atorvastatin   40 mg Per Tube q1800   diclofenac Sodium  2 g Topical QID   docusate  100 mg Per Tube BID   famotidine  20 mg Per Tube BID   free water  125 mL Per Tube Q6H   glycopyrrolate   1 mg Per Tube TID   heparin  injection (subcutaneous)  5,000 Units Subcutaneous Q8H   levETIRAcetam  500 mg Per Tube BID   methocarbamol   500 mg Per Tube Q8H   metoprolol  tartrate  37.5 mg Per Tube BID  mouth rinse  15 mL Mouth Rinse 4 times per day   polyethylene glycol  17 g Per Tube Daily   scopolamine  1 patch Transdermal Q72H   Continuous Infusions:  feeding supplement (OSMOLITE 1.5 CAL) 1,000 mL (04/20/24 1652)     LOS: 25 days    Time spent: 30 minutes spent on 04/21/2024 caring for this patient face-to-face including chart review, ordering labs/tests, documenting, discussion with nursing staff, consultants, updating family and interview/physical exam    Camellia PARAS Yosgart Pavey, DO [facility] Available via Epic secure chat 7am-7pm After these hours, please refer to coverage provider listed on [email or web address] 04/21/2024, 11:43 AM

## 2024-04-22 ENCOUNTER — Ambulatory Visit

## 2024-04-22 DIAGNOSIS — E1169 Type 2 diabetes mellitus with other specified complication: Secondary | ICD-10-CM

## 2024-04-22 DIAGNOSIS — R6889 Other general symptoms and signs: Secondary | ICD-10-CM | POA: Diagnosis not present

## 2024-04-22 DIAGNOSIS — C140 Malignant neoplasm of pharynx, unspecified: Secondary | ICD-10-CM | POA: Diagnosis not present

## 2024-04-22 DIAGNOSIS — Z7189 Other specified counseling: Secondary | ICD-10-CM | POA: Diagnosis not present

## 2024-04-22 DIAGNOSIS — J392 Other diseases of pharynx: Secondary | ICD-10-CM | POA: Diagnosis not present

## 2024-04-22 NOTE — Hospital Course (Signed)
 68 y.o. male with past medical history significant for CAD/PAD, DM2, HTN, HLD, questionable dementia who presented to [facility] ED on 03/27/2024 for biopsy of pharyngeal mass and tracheostomy placement.  Patient underwent posterior pharyngeal mass biopsy with tracheostomy by ENT, Dr. Linell on 03/27/2024; with frozen section during case positive for SCC.  Patient was admitted to the intensive care unit/critical care service postoperatively.   Significant hospital events: 10/3: laryngoscopy, biopsy, tracheostomy; admitted to ICU/PCCM postoperatively  10/6: gtube placement by Dr. 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 Postoperatively admitted to the pulmonary critical care service/ICU. Pathology consistent with squamous cell carcinoma. Seen by medical oncology, Dr. Conchetta on 10/10; patient not interested in chemotherapy. Radiation oncology was consulted with recommendation of 7 weeks of radiation therapy which patient has been noncompliant and now has been discontinued.  Continues on trach care, 6 L via trach collar.   Providencia/Klebsiella tracheostomy infection-resolved - Completed 7-day course of ceftriaxone.   Seizure disorder - Keppra 500 mg per tube twice daily.   Hypertension - Metoprolol  titrate 37.5 mg per tube twice daily - Diabetes mellitus - Insulin  sliding scale board.   PAD s/p BL AKA/CAD - Noted.  Statin on board.   Severe protein calorie malnutrition/adult failure to thrive  - Secondary to above.  G-tube placed 10/6.  Continues on tube feeds.   Goals of care - Patient continues to be  noncompliant with medications, refusing therapy from oncology and radiology.  Evaluated by palliative care.  Recommending hospice if the patient does not want to pursue further aggressive treatment.  In the meantime, patient cannot be discharged back to SNF until 11/3.

## 2024-04-22 NOTE — Plan of Care (Signed)
°  Problem: Clinical Measurements: Goal: Will remain free from infection Outcome: Progressing   Problem: Education: Goal: Knowledge of General Education information will improve Description: Including pain rating scale, medication(s)/side effects and non-pharmacologic comfort measures Outcome: Not Progressing   Problem: Health Behavior/Discharge Planning: Goal: Ability to manage health-related needs will improve Outcome: Not Progressing   Problem: Clinical Measurements: Goal: Ability to maintain clinical measurements within normal limits will improve Outcome: Not Progressing

## 2024-04-22 NOTE — Progress Notes (Addendum)
 Progress Note   Patient: Stephen Bowen FMW:[id number] DOB: 1956-02-07 DOA: 03/27/2024  DOS: the patient was seen and examined on 04/22/2024   Brief hospital course:  68 y.o. male with past medical history significant for CAD/PAD, DM2, HTN, HLD, questionable dementia who presented to [facility] ED on 03/27/2024 for biopsy of pharyngeal mass and tracheostomy placement.  Patient underwent posterior pharyngeal mass biopsy with tracheostomy by ENT, Dr. Linell on 03/27/2024; with frozen section during case positive for SCC.  Patient was admitted to the intensive care unit/critical care service postoperatively.   Significant hospital events: 10/3: laryngoscopy, biopsy, tracheostomy; admitted to ICU/PCCM postoperatively  10/6: gtube placement by Dr. Ebbie; transferred to 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 Postoperatively admitted to the pulmonary critical care service/ICU. Pathology consistent with squamous cell carcinoma. Seen by medical oncology, Dr. Conchetta on 10/10; patient not interested in chemotherapy. Radiation oncology was consulted with recommendation of 7 weeks of radiation therapy which patient has been noncompliant and now has been discontinued.  Continues on trach care, 6 L via trach collar.  Provide NCS/Klebsiella tracheostomy infection-resolved - Completed 7-day course of ceftriaxone.  Seizure disorder - Keppra 500 mg per tube twice daily.  Hypertension - Metoprolol  titrate 37.5 mg per tube twice daily - Diabetes mellitus - Insulin  sliding scale board.  PAD s/p BL AKA/CAD - Noted.  Statin on board.  Severe  protein calorie malnutrition/adult failure to thrive  - Secondary to above.  G-tube placed 10/6.  Continues on tube feeds.  Goals of care - Patient continues to be noncompliant with medications, refusing therapy from oncology and radiology.  Evaluated by palliative care.  Recommending hospice if the patient does not want to pursue further aggressive treatment.  In the meantime, patient cannot be discharged back to SNF until 11/3.  Subjective: Patient resting comfortably this morning.  Suctioning secretions from trach.  No acute events overnight.  Reports from staff that he refuses medications for lab draws.  Eager to be discharged 11/3.  Physical Exam:  Vitals:   04/21/24 2340 04/22/24 0403 04/22/24 0825 04/22/24 1113  BP:    120/86  Pulse: 98 (!) 107 (!) 114 (!) 109  Resp: 16 16 17    Temp:      TempSrc:      SpO2: 97% 97% 96%   Weight:      Height:        GENERAL:  Alert, pleasant, no acute distress, frail HEENT:  EOMI, tracheostomy CARDIOVASCULAR:  RRR, no murmurs appreciated RESPIRATORY:  Clear to auscultation, no wheezing, rales, or rhonchi GASTROINTESTINAL: G-tube, soft, nontender, nondistended EXTREMITIES: Thin, BL AKA NEURO:  No new focal deficits appreciated SKIN:  No rashes noted PSYCH:  Appropriate mood and affect     Data Reviewed:  Imaging Studies: CT ANGIO NECK W OR WO CONTRAST Result Date: 03/29/2024 EXAM: CTA Neck 03/29/2024 05:39:44 AM TECHNIQUE: CT of the neck was performed without and with the administration of 75 mL of iohexol (OMNIPAQUE) 350 MG/ML injection. Multiplanar 2D and/or 3D reformatted images are provided for review. Automated exposure control, iterative reconstruction, and/or weight based adjustment of the mA/kV was utilized to reduce the radiation dose to as low as reasonably achievable.  Stenosis of the internal carotid arteries measured using NASCET criteria. COMPARISON: CT of the neck dated 02/27/2024 and PET/CT fusion study dated 03/09/2024.  CLINICAL HISTORY: Pharyngeal cancer, oral bleeding need to evaluate for major vessel invasion by the tumor extravasation. FINDINGS: AORTIC ARCH AND ARCH VESSELS: No dissection or arterial injury. No significant stenosis of the brachiocephalic or subclavian arteries. CERVICAL CAROTID ARTERIES: No dissection, arterial injury, or hemodynamically significant stenosis by NASCET criteria. No encasement of the carotid arteries or proximal branches of the external carotid arteries. CERVICAL VERTEBRAL ARTERIES: No dissection, arterial injury, or significant stenosis. LUNGS AND MEDIASTINUM: Since the previous studies, a tracheostomy tube has been placed and its tip is in an appropriate position within the trachea. SOFT TISSUES: There is nodular thickening and enhancement again demonstrated within the posterior pharyngeal wall of the oral pharynx and hypopharynx bilaterally, more extensive on the left. There is no evidence of active bleeding. BONES: No acute abnormality. IMPRESSION: 1. Nodular thickening and enhancement within the posterior pharyngeal wall of the oral and hypopharynx bilaterally, more extensive on the left, without evidence of active bleeding or major vessel invasion 2. Tracheostomy tube in appropriate position within the trachea Electronically signed by: Evalene Coho MD 03/29/2024 06:38 AM EDT RP Workstation: [id number]   CT ABDOMEN WO CONTRAST Result Date: 03/29/2024 CLINICAL DATA:  Assess G-tube placement. Head and neck cancer. * Tracking Code: BO * EXAM: CT ABDOMEN WITHOUT CONTRAST TECHNIQUE: Multidetector CT imaging of the abdomen was performed following the standard protocol without IV contrast. RADIATION DOSE REDUCTION: This exam was performed according to the departmental dose-optimization program which includes automated exposure control, adjustment of the mA and/or kV according to patient size and/or use of iterative reconstruction technique. COMPARISON:  PET-CT 03/09/2024 FINDINGS: Image  quality degraded by beam hardening artifact emanating from retained barium contrast material in the colon . Lower chest: There is debris in the bronchus intermedius and left lower lobe airway. Central collapse/consolidative opacity is seen in the left upper lobe (image 9/series 6 with peripheral airway impaction seen in the posterior lower lobes bilaterally. Dependent atelectasis noted in both lung bases with trace bilateral pleural effusions. Hepatobiliary: No suspicious focal abnormality in the liver on this study without intravenous contrast. Gallbladder is distended. Tiny calcified gallstones evident. Porta hepatis, pancreatic head, and descending duodenum obscured by beam hardening artifact. Pancreas: Pancreas essentially obscured by beam hardening artifact from retained barium contrast material in the colon. Spleen: No splenomegaly. No suspicious focal mass lesion. Adrenals/Urinary Tract: No adrenal nodule or mass. Right kidney unremarkable. Tiny nonobstructing stone identified lower pole left kidney. Stomach/Bowel: Stomach is nondistended in largely obscured by artifact. As above, descending duodenum is obscured. No small bowel dilatation within the visualized abdomen. Diffuse colonic distension noted with retained contrast material. Vascular/Lymphatic: There is moderate atherosclerotic calcification of the abdominal aorta without aneurysm. No retroperitoneal lymphadenopathy. Gastrohepatic and hepatoduodenal ligaments are largely obscured by beam hardening artifact. Other: No intraperitoneal free fluid. Musculoskeletal: No worrisome lytic or sclerotic osseous abnormality. Old posterior left rib fractures evident. Compression deformity at T11 and T12 is similar to chest CT of 02/20/2024. IMPRESSION: 1. Image quality degraded by beam hardening artifact emanating from retained barium contrast material in the colon. 2. Debris in the bronchus intermedius and left lower lobe airway with central  collapse/consolidative opacity in the left upper lobe. Imaging features concerning for aspiration with associated infectious/inflammatory consolidative opacity in the central left lower lobe. 3. Stomach is nondistended and largely obscured by artifact. 4. Diffuse colonic distension with retained  contrast material. 5. Cholelithiasis. 6. Tiny nonobstructing stone lower pole left kidney. 7.  Aortic Atherosclerosis (ICD10-I70.0). Electronically Signed   By: Camellia Candle M.D.   On: 03/29/2024 06:02   DG Chest Port 1 View Result Date: 03/27/2024 CLINICAL DATA:  Status post tracheostomy. EXAM: PORTABLE CHEST 1 VIEW COMPARISON:  PET CT 03/09/2024 FINDINGS: Tracheostomy tube tip projects over the trachea at the level of the clavicles. No evidence of pneumomediastinum or pneumothorax. The heart is normal in size. No pleural effusion or focal airspace disease. Multiple remote left rib fractures. Barium in the colon from prior esophagram. IMPRESSION: Tracheostomy tube tip projects over the trachea at the level of the clavicles. No pneumomediastinum or pneumothorax. Electronically Signed   By: Andrea Gasman M.D.   On: 03/27/2024 17:30    There are no new results to review at this time.  Previous records (including but not limited to H&P, progress notes, nursing notes, TOC management) were reviewed in assessment of this patient.  Labs: CBC: No results for input(s): WBC, NEUTROABS, HGB, HCT, MCV, PLT in the last 168 hours. Basic Metabolic Panel: No results for input(s): NA, K, CL, CO2, GLUCOSE, BUN, CREATININE, CALCIUM , MG, PHOS in the last 168 hours. Liver Function Tests: No results for input(s): AST, ALT, ALKPHOS, BILITOT, PROT, ALBUMIN  in the last 168 hours. CBG: Recent Labs  Lab 04/19/24 1113 04/19/24 1716 04/20/24 0008 04/20/24 0546 04/20/24 1158  GLUCAP 111* 88 106* 106* 130*    Scheduled Meds:  atorvastatin   40 mg Per Tube q1800   diclofenac  Sodium  2 g Topical QID   docusate  100 mg Per Tube BID   famotidine  20 mg Per Tube BID   free water  125 mL Per Tube Q6H   glycopyrrolate   1 mg Per Tube TID   levETIRAcetam  500 mg Per Tube BID   methocarbamol   500 mg Per Tube Q8H   metoprolol  tartrate  37.5 mg Per Tube BID   mouth rinse  15 mL Mouth Rinse 4 times per day   polyethylene glycol  17 g Per Tube Daily   scopolamine  1 patch Transdermal Q72H   Continuous Infusions:  feeding supplement (OSMOLITE 1.5 CAL) 1,000 mL (04/21/24 1500)   PRN Meds:.acetaminophen , guaiFENesin -dextromethorphan, hydrALAZINE , HYDROmorphone  (DILAUDID ) injection, ondansetron  (ZOFRAN ) IV, mouth rinse, oxyCODONE , sodium bicarbonate/sodium chloride   Family Communication: Not at bedside  Disposition: Status is: Inpatient Remains inpatient appropriate because: Disposition     Time spent: 35 minutes  Length of inpatient stay: 26 days  Author: Carliss LELON Canales, DO 04/22/2024 11:59 AM  For on call review [email or web address].

## 2024-04-22 NOTE — Progress Notes (Signed)
 Patient refused all care and assessments. MD and charge RN aware. Will continue to monitor patient

## 2024-04-23 ENCOUNTER — Ambulatory Visit

## 2024-04-23 DIAGNOSIS — C140 Malignant neoplasm of pharynx, unspecified: Secondary | ICD-10-CM | POA: Diagnosis not present

## 2024-04-23 DIAGNOSIS — Z930 Tracheostomy status: Secondary | ICD-10-CM | POA: Diagnosis not present

## 2024-04-23 DIAGNOSIS — E43 Unspecified severe protein-calorie malnutrition: Secondary | ICD-10-CM | POA: Diagnosis not present

## 2024-04-23 DIAGNOSIS — J392 Other diseases of pharynx: Secondary | ICD-10-CM | POA: Diagnosis not present

## 2024-04-23 NOTE — Plan of Care (Signed)
  Problem: Education: Goal: Knowledge of General Education information will improve Description: Including pain rating scale, medication(s)/side effects and non-pharmacologic comfort measures Outcome: Progressing   Problem: Health Behavior/Discharge Planning: Goal: Ability to manage health-related needs will improve Outcome: Progressing   Problem: Clinical Measurements: Goal: Ability to maintain clinical measurements within normal limits will improve Outcome: Progressing Goal: Will remain free from infection Outcome: Progressing Goal: Diagnostic test results will improve Outcome: Progressing Goal: Respiratory complications will improve Outcome: Progressing Goal: Cardiovascular complication will be avoided Outcome: Progressing   Problem: Activity: Goal: Risk for activity intolerance will decrease Outcome: Progressing   Problem: Nutrition: Goal: Adequate nutrition will be maintained Outcome: Progressing   Problem: Coping: Goal: Level of anxiety will decrease Outcome: Progressing   Problem: Elimination: Goal: Will not experience complications related to bowel motility Outcome: Progressing Goal: Will not experience complications related to urinary retention Outcome: Progressing   Problem: Pain Managment: Goal: General experience of comfort will improve and/or be controlled Outcome: Progressing   Problem: Safety: Goal: Ability to remain free from injury will improve Outcome: Progressing   Problem: Skin Integrity: Goal: Risk for impaired skin integrity will decrease Outcome: Progressing   Problem: Education: Goal: Knowledge about tracheostomy care/management will improve Outcome: Progressing   Problem: Activity: Goal: Ability to tolerate increased activity will improve Outcome: Progressing   Problem: Health Behavior/Discharge Planning: Goal: Ability to manage tracheostomy will improve Outcome: Progressing   Problem: Respiratory: Goal: Patent airway  maintenance will improve Outcome: Progressing   Problem: Role Relationship: Goal: Ability to communicate will improve Outcome: Progressing   Problem: Education: Goal: Ability to describe self-care measures that may prevent or decrease complications (Diabetes Survival Skills Education) will improve Outcome: Progressing Goal: Individualized Educational Video(s) Outcome: Progressing   Problem: Coping: Goal: Ability to adjust to condition or change in health will improve Outcome: Progressing   Problem: Fluid Volume: Goal: Ability to maintain a balanced intake and output will improve Outcome: Progressing   Problem: Health Behavior/Discharge Planning: Goal: Ability to identify and utilize available resources and services will improve Outcome: Progressing Goal: Ability to manage health-related needs will improve Outcome: Progressing   Problem: Metabolic: Goal: Ability to maintain appropriate glucose levels will improve Outcome: Progressing   Problem: Nutritional: Goal: Maintenance of adequate nutrition will improve Outcome: Progressing Goal: Progress toward achieving an optimal weight will improve Outcome: Progressing   Problem: Skin Integrity: Goal: Risk for impaired skin integrity will decrease Outcome: Progressing   Problem: Tissue Perfusion: Goal: Adequacy of tissue perfusion will improve Outcome: Progressing   Problem: Respiratory: Goal: Ability to maintain a clear airway and adequate ventilation will improve Outcome: Progressing   Problem: Role Relationship: Goal: Method of communication will improve Outcome: Progressing

## 2024-04-23 NOTE — Progress Notes (Signed)
 This RN attempted to provide care for patient and patient refused all care and told this RN to leave. This RN called respiratory to see if patient would allow them to suction patient as this RN believes patient needs to be suctioned. Patient refused for this RN to suction his trach. Patient refused assessment and all care from this RN. Will continue to monitor patient.

## 2024-04-23 NOTE — Plan of Care (Signed)

## 2024-04-23 NOTE — Progress Notes (Signed)
 Pt continues to decline care, will sign off.   Madelin POUR, MS [facility] SLP Acute [employer] [phone]

## 2024-04-23 NOTE — Progress Notes (Signed)
 Progress Note   Patient: Stephen Bowen FMW:[id number] DOB: 1956/04/22 DOA: 03/27/2024  DOS: the patient was seen and examined on 04/23/2024   Brief hospital course:  68 y.o. male with past medical history significant for CAD/PAD, DM2, HTN, HLD, questionable dementia who presented to [facility] ED on 03/27/2024 for biopsy of pharyngeal mass and tracheostomy placement.  Patient underwent posterior pharyngeal mass biopsy with tracheostomy by ENT, Dr. Linell on 03/27/2024; with frozen section during case positive for SCC.  Patient was admitted to the intensive care unit/critical care service postoperatively.   Significant hospital events: 10/3: laryngoscopy, biopsy, tracheostomy; admitted to ICU/PCCM postoperatively  10/6: gtube placement by Dr. 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 Postoperatively admitted to the pulmonary critical care service/ICU. Pathology consistent with squamous cell carcinoma. Seen by medical oncology, Dr. Conchetta on 10/10; patient not interested in chemotherapy. Radiation oncology was consulted with recommendation of 7 weeks of radiation therapy which patient has been noncompliant and now has been discontinued.  Continues on trach care, 6 L via trach collar.  Providencia/Klebsiella tracheostomy infection-resolved - Completed 7-day course of ceftriaxone.  Seizure disorder - Keppra 500 mg per tube twice daily.  Hypertension - Metoprolol  titrate 37.5 mg per tube twice daily - Diabetes mellitus - Insulin  sliding scale board.  PAD s/p BL AKA/CAD - Noted.  Statin on board.  Severe  protein calorie malnutrition/adult failure to thrive  - Secondary to above.  G-tube placed 10/6.  Continues on tube feeds.  Goals of care - Patient continues to be noncompliant with medications, refusing therapy from oncology and radiology.  Evaluated by palliative care.  Recommending hospice if the patient does not want to pursue further aggressive treatment.  In the meantime, patient cannot be discharged back to SNF until 11/3.  Subjective: Patient resting comfortably this morning.  No acute events overnight.  Continues to refuse medications for lab draws.    Physical Exam:  Vitals:   04/23/24 0410 04/23/24 0532 04/23/24 0808 04/23/24 1205  BP:  109/78    Pulse: (!) 104 (!) 108 (!) 109 (!) 116  Resp: 16 18 18 18   Temp:  98.4 F (36.9 C)    TempSrc:  Oral    SpO2: 96% 98% 94% 95%  Weight:      Height:        GENERAL:  Alert, pleasant, no acute distress, cachectic HEENT:  EOMI, tracheostomy CARDIOVASCULAR:  RRR, no murmurs appreciated RESPIRATORY:  Clear to auscultation, no wheezing, rales, or rhonchi GASTROINTESTINAL: G-tube, soft, nontender, nondistended EXTREMITIES: Thin, BL AKA NEURO:  No new focal deficits appreciated SKIN:  No rashes noted PSYCH:  Appropriate mood and affect     Data Reviewed:  Imaging Studies: CT ANGIO NECK W OR WO CONTRAST Result Date: 03/29/2024 EXAM: CTA Neck 03/29/2024 05:39:44 AM TECHNIQUE: CT of the neck was performed without and with the administration of 75 mL of iohexol (OMNIPAQUE) 350 MG/ML injection. Multiplanar 2D and/or 3D reformatted images are provided for review. Automated exposure control, iterative reconstruction, and/or weight based adjustment of the mA/kV was utilized to reduce the radiation dose to as low as reasonably achievable. Stenosis of the internal carotid arteries measured using NASCET  criteria. COMPARISON: CT of the neck dated 02/27/2024 and PET/CT fusion study dated 03/09/2024. CLINICAL HISTORY: Pharyngeal cancer, oral  bleeding need to evaluate for major vessel invasion by the tumor extravasation. FINDINGS: AORTIC ARCH AND ARCH VESSELS: No dissection or arterial injury. No significant stenosis of the brachiocephalic or subclavian arteries. CERVICAL CAROTID ARTERIES: No dissection, arterial injury, or hemodynamically significant stenosis by NASCET criteria. No encasement of the carotid arteries or proximal branches of the external carotid arteries. CERVICAL VERTEBRAL ARTERIES: No dissection, arterial injury, or significant stenosis. LUNGS AND MEDIASTINUM: Since the previous studies, a tracheostomy tube has been placed and its tip is in an appropriate position within the trachea. SOFT TISSUES: There is nodular thickening and enhancement again demonstrated within the posterior pharyngeal wall of the oral pharynx and hypopharynx bilaterally, more extensive on the left. There is no evidence of active bleeding. BONES: No acute abnormality. IMPRESSION: 1. Nodular thickening and enhancement within the posterior pharyngeal wall of the oral and hypopharynx bilaterally, more extensive on the left, without evidence of active bleeding or major vessel invasion 2. Tracheostomy tube in appropriate position within the trachea Electronically signed by: Evalene Coho MD 03/29/2024 06:38 AM EDT RP Workstation: [id number]   CT ABDOMEN WO CONTRAST Result Date: 03/29/2024 CLINICAL DATA:  Assess G-tube placement. Head and neck cancer. * Tracking Code: BO * EXAM: CT ABDOMEN WITHOUT CONTRAST TECHNIQUE: Multidetector CT imaging of the abdomen was performed following the standard protocol without IV contrast. RADIATION DOSE REDUCTION: This exam was performed according to the departmental dose-optimization program which includes automated exposure control, adjustment of the mA and/or kV according to patient size and/or use of iterative reconstruction technique. COMPARISON:  PET-CT 03/09/2024 FINDINGS: Image quality degraded by beam hardening artifact  emanating from retained barium contrast material in the colon . Lower chest: There is debris in the bronchus intermedius and left lower lobe airway. Central collapse/consolidative opacity is seen in the left upper lobe (image 9/series 6 with peripheral airway impaction seen in the posterior lower lobes bilaterally. Dependent atelectasis noted in both lung bases with trace bilateral pleural effusions. Hepatobiliary: No suspicious focal abnormality in the liver on this study without intravenous contrast. Gallbladder is distended. Tiny calcified gallstones evident. Porta hepatis, pancreatic head, and descending duodenum obscured by beam hardening artifact. Pancreas: Pancreas essentially obscured by beam hardening artifact from retained barium contrast material in the colon. Spleen: No splenomegaly. No suspicious focal mass lesion. Adrenals/Urinary Tract: No adrenal nodule or mass. Right kidney unremarkable. Tiny nonobstructing stone identified lower pole left kidney. Stomach/Bowel: Stomach is nondistended in largely obscured by artifact. As above, descending duodenum is obscured. No small bowel dilatation within the visualized abdomen. Diffuse colonic distension noted with retained contrast material. Vascular/Lymphatic: There is moderate atherosclerotic calcification of the abdominal aorta without aneurysm. No retroperitoneal lymphadenopathy. Gastrohepatic and hepatoduodenal ligaments are largely obscured by beam hardening artifact. Other: No intraperitoneal free fluid. Musculoskeletal: No worrisome lytic or sclerotic osseous abnormality. Old posterior left rib fractures evident. Compression deformity at T11 and T12 is similar to chest CT of 02/20/2024. IMPRESSION: 1. Image quality degraded by beam hardening artifact emanating from retained barium contrast material in the colon. 2. Debris in the bronchus intermedius and left lower lobe airway with central collapse/consolidative opacity in the left upper lobe. Imaging  features concerning for aspiration with associated infectious/inflammatory consolidative opacity in the central left lower lobe. 3. Stomach is nondistended and largely obscured by artifact. 4. Diffuse colonic distension with retained contrast material. 5. Cholelithiasis. 6. Tiny nonobstructing stone lower  pole left kidney. 7.  Aortic Atherosclerosis (ICD10-I70.0). Electronically Signed   By: Camellia Candle M.D.   On: 03/29/2024 06:02   DG Chest Port 1 View Result Date: 03/27/2024 CLINICAL DATA:  Status post tracheostomy. EXAM: PORTABLE CHEST 1 VIEW COMPARISON:  PET CT 03/09/2024 FINDINGS: Tracheostomy tube tip projects over the trachea at the level of the clavicles. No evidence of pneumomediastinum or pneumothorax. The heart is normal in size. No pleural effusion or focal airspace disease. Multiple remote left rib fractures. Barium in the colon from prior esophagram. IMPRESSION: Tracheostomy tube tip projects over the trachea at the level of the clavicles. No pneumomediastinum or pneumothorax. Electronically Signed   By: Andrea Gasman M.D.   On: 03/27/2024 17:30    There are no new results to review at this time.  Previous records (including but not limited to H&P, progress notes, nursing notes, TOC management) were reviewed in assessment of this patient.  Labs: CBC: No results for input(s): WBC, NEUTROABS, HGB, HCT, MCV, PLT in the last 168 hours. Basic Metabolic Panel: No results for input(s): NA, K, CL, CO2, GLUCOSE, BUN, CREATININE, CALCIUM , MG, PHOS in the last 168 hours. Liver Function Tests: No results for input(s): AST, ALT, ALKPHOS, BILITOT, PROT, ALBUMIN  in the last 168 hours. CBG: Recent Labs  Lab 04/19/24 1113 04/19/24 1716 04/20/24 0008 04/20/24 0546 04/20/24 1158  GLUCAP 111* 88 106* 106* 130*    Scheduled Meds:  atorvastatin   40 mg Per Tube q1800   diclofenac Sodium  2 g Topical QID   docusate  100 mg Per Tube BID    famotidine  20 mg Per Tube BID   free water  125 mL Per Tube Q6H   glycopyrrolate   1 mg Per Tube TID   levETIRAcetam  500 mg Per Tube BID   methocarbamol   500 mg Per Tube Q8H   metoprolol  tartrate  37.5 mg Per Tube BID   mouth rinse  15 mL Mouth Rinse 4 times per day   polyethylene glycol  17 g Per Tube Daily   scopolamine  1 patch Transdermal Q72H   Continuous Infusions:  feeding supplement (OSMOLITE 1.5 CAL) 1,000 mL (04/22/24 1622)   PRN Meds:.acetaminophen , guaiFENesin -dextromethorphan, hydrALAZINE , HYDROmorphone  (DILAUDID ) injection, ondansetron  (ZOFRAN ) IV, mouth rinse, oxyCODONE , sodium bicarbonate/sodium chloride   Family Communication: Not at bedside  Disposition: Status is: Inpatient Remains inpatient appropriate because: Disposition     Time spent: 31 minutes  Length of inpatient stay: 27 days  Author: Carliss LELON Canales, DO 04/23/2024 12:48 PM  For on call review [email or web address].

## 2024-04-23 NOTE — Progress Notes (Signed)
 Pt refusing all meds an interventions at this time.

## 2024-04-24 ENCOUNTER — Ambulatory Visit

## 2024-04-24 DIAGNOSIS — Z930 Tracheostomy status: Secondary | ICD-10-CM | POA: Diagnosis not present

## 2024-04-24 DIAGNOSIS — C140 Malignant neoplasm of pharynx, unspecified: Secondary | ICD-10-CM | POA: Diagnosis not present

## 2024-04-24 DIAGNOSIS — E43 Unspecified severe protein-calorie malnutrition: Secondary | ICD-10-CM | POA: Diagnosis not present

## 2024-04-24 DIAGNOSIS — J392 Other diseases of pharynx: Secondary | ICD-10-CM | POA: Diagnosis not present

## 2024-04-24 MED ORDER — ORAL CARE MOUTH RINSE
15.0000 mL | OROMUCOSAL | Status: DC | PRN
Start: 2024-04-24 — End: 2024-04-28

## 2024-04-24 NOTE — Plan of Care (Signed)
  Problem: Clinical Measurements: Goal: Diagnostic test results will improve Outcome: Progressing   Problem: Coping: Goal: Level of anxiety will decrease Outcome: Progressing   Problem: Skin Integrity: Goal: Risk for impaired skin integrity will decrease Outcome: Progressing   Problem: Education: Goal: Knowledge about tracheostomy care/management will improve Outcome: Progressing   Problem: Skin Integrity: Goal: Risk for impaired skin integrity will decrease Outcome: Progressing

## 2024-04-24 NOTE — Progress Notes (Signed)
 Progress Note   Patient: Stephen Bowen FMW:[id number] DOB: [birth year] DOA: 03/27/2024  DOS: the patient was seen and examined on 04/24/2024   Brief hospital course:  68 y.o. male with past medical history significant for CAD/PAD, DM2, HTN, HLD, questionable dementia who presented to [facility] ED on 03/27/2024 for biopsy of pharyngeal mass and tracheostomy placement.  Patient underwent posterior pharyngeal mass biopsy with tracheostomy by ENT, Dr. Linell on 03/27/2024; with frozen section during case positive for SCC.  Patient was admitted to the intensive care unit/critical care service postoperatively.   Significant hospital events: 10/3: laryngoscopy, biopsy, tracheostomy; admitted to ICU/PCCM postoperatively  10/6: gtube placement by Dr. Ebbie; transferred to 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 Postoperatively admitted to the pulmonary critical care service/ICU. Pathology consistent with squamous cell carcinoma. Seen by medical oncology, Dr. Conchetta on 10/10; patient not interested in chemotherapy. Radiation oncology was consulted with recommendation of 7 weeks of radiation therapy which patient has been noncompliant and now has been discontinued.  Continues on trach care, 6 L via trach collar.  Providencia/Klebsiella tracheostomy infection-resolved - Completed 7-day course of ceftriaxone.  Seizure disorder - Keppra 500 mg per tube twice daily.  Hypertension - Metoprolol  titrate 37.5 mg per tube twice daily  Diabetes mellitus - Insulin  sliding scale board.  PAD s/p BL AKA/CAD - Noted.  Statin on board.  Severe  protein calorie malnutrition/adult failure to thrive  - Secondary to above.  G-tube placed 10/6.  Continues on tube feeds.  Goals of care - Patient continues to be noncompliant with medications, refusing therapy from oncology and radiology.  Evaluated by palliative care.  Recommending hospice if the patient does not want to pursue further aggressive treatment.  In the meantime, patient cannot be discharged back to SNF until 11/3.  Subjective: Patient resting comfortably this morning.  No acute events overnight.  Continues to refuse medications for lab draws.    Physical Exam:  Vitals:   04/23/24 1937 04/23/24 2328 04/24/24 0418 04/24/24 0807  BP:      Pulse: (!) 113 (!) 111 (!) 108   Resp: 19 16 16    Temp:      TempSrc:      SpO2: 95% 95% 95% 95%  Weight:      Height:        GENERAL:  Alert, pleasant, no acute distress, cachectic HEENT:  EOMI, tracheostomy CARDIOVASCULAR:  RRR, no murmurs appreciated RESPIRATORY:  Clear to auscultation, no wheezing, rales, or rhonchi GASTROINTESTINAL: G-tube, soft, nontender, nondistended EXTREMITIES: Thin, BL AKA NEURO:  No new focal deficits appreciated SKIN:  No rashes noted PSYCH:  Appropriate mood and affect     Data Reviewed:  Imaging Studies: CT ANGIO NECK W OR WO CONTRAST Result Date: 03/29/2024 EXAM: CTA Neck 03/29/2024 05:39:44 AM TECHNIQUE: CT of the neck was performed without and with the administration of 75 mL of iohexol (OMNIPAQUE) 350 MG/ML injection. Multiplanar 2D and/or 3D reformatted images are provided for review. Automated exposure control, iterative reconstruction, and/or weight based adjustment of the mA/kV was utilized to reduce the radiation dose to as low as reasonably achievable. Stenosis of the internal carotid arteries measured using NASCET criteria. COMPARISON: CT of  the neck dated 02/27/2024 and PET/CT fusion study dated 03/09/2024. CLINICAL HISTORY: Pharyngeal cancer, oral bleeding need to evaluate for major  vessel invasion by the tumor extravasation. FINDINGS: AORTIC ARCH AND ARCH VESSELS: No dissection or arterial injury. No significant stenosis of the brachiocephalic or subclavian arteries. CERVICAL CAROTID ARTERIES: No dissection, arterial injury, or hemodynamically significant stenosis by NASCET criteria. No encasement of the carotid arteries or proximal branches of the external carotid arteries. CERVICAL VERTEBRAL ARTERIES: No dissection, arterial injury, or significant stenosis. LUNGS AND MEDIASTINUM: Since the previous studies, a tracheostomy tube has been placed and its tip is in an appropriate position within the trachea. SOFT TISSUES: There is nodular thickening and enhancement again demonstrated within the posterior pharyngeal wall of the oral pharynx and hypopharynx bilaterally, more extensive on the left. There is no evidence of active bleeding. BONES: No acute abnormality. IMPRESSION: 1. Nodular thickening and enhancement within the posterior pharyngeal wall of the oral and hypopharynx bilaterally, more extensive on the left, without evidence of active bleeding or major vessel invasion 2. Tracheostomy tube in appropriate position within the trachea Electronically signed by: Evalene Coho MD 03/29/2024 06:38 AM EDT RP Workstation: [id number]   CT ABDOMEN WO CONTRAST Result Date: 03/29/2024 CLINICAL DATA:  Assess G-tube placement. Head and neck cancer. * Tracking Code: BO * EXAM: CT ABDOMEN WITHOUT CONTRAST TECHNIQUE: Multidetector CT imaging of the abdomen was performed following the standard protocol without IV contrast. RADIATION DOSE REDUCTION: This exam was performed according to the departmental dose-optimization program which includes automated exposure control, adjustment of the mA and/or kV according to patient size and/or use of iterative reconstruction technique. COMPARISON:  PET-CT 03/09/2024 FINDINGS: Image quality degraded by beam hardening artifact emanating from retained barium  contrast material in the colon . Lower chest: There is debris in the bronchus intermedius and left lower lobe airway. Central collapse/consolidative opacity is seen in the left upper lobe (image 9/series 6 with peripheral airway impaction seen in the posterior lower lobes bilaterally. Dependent atelectasis noted in both lung bases with trace bilateral pleural effusions. Hepatobiliary: No suspicious focal abnormality in the liver on this study without intravenous contrast. Gallbladder is distended. Tiny calcified gallstones evident. Porta hepatis, pancreatic head, and descending duodenum obscured by beam hardening artifact. Pancreas: Pancreas essentially obscured by beam hardening artifact from retained barium contrast material in the colon. Spleen: No splenomegaly. No suspicious focal mass lesion. Adrenals/Urinary Tract: No adrenal nodule or mass. Right kidney unremarkable. Tiny nonobstructing stone identified lower pole left kidney. Stomach/Bowel: Stomach is nondistended in largely obscured by artifact. As above, descending duodenum is obscured. No small bowel dilatation within the visualized abdomen. Diffuse colonic distension noted with retained contrast material. Vascular/Lymphatic: There is moderate atherosclerotic calcification of the abdominal aorta without aneurysm. No retroperitoneal lymphadenopathy. Gastrohepatic and hepatoduodenal ligaments are largely obscured by beam hardening artifact. Other: No intraperitoneal free fluid. Musculoskeletal: No worrisome lytic or sclerotic osseous abnormality. Old posterior left rib fractures evident. Compression deformity at T11 and T12 is similar to chest CT of 02/20/2024. IMPRESSION: 1. Image quality degraded by beam hardening artifact emanating from retained barium contrast material in the colon. 2. Debris in the bronchus intermedius and left lower lobe airway with central collapse/consolidative opacity in the left upper lobe. Imaging features concerning for  aspiration with associated infectious/inflammatory consolidative opacity in the central left lower lobe. 3. Stomach is nondistended and largely obscured by artifact. 4. Diffuse colonic distension with retained contrast material. 5. Cholelithiasis. 6. Tiny nonobstructing stone lower pole left kidney. 7.  Aortic Atherosclerosis (ICD10-I70.0). Electronically Signed   By: Camellia Candle M.D.   On: 03/29/2024 06:02   DG Chest Port 1 View Result Date: 03/27/2024 CLINICAL DATA:  Status post tracheostomy. EXAM: PORTABLE CHEST 1 VIEW COMPARISON:  PET CT 03/09/2024 FINDINGS: Tracheostomy tube tip projects over the trachea at the level of the clavicles. No evidence of pneumomediastinum or pneumothorax. The heart is normal in size. No pleural effusion or focal airspace disease. Multiple remote left rib fractures. Barium in the colon from prior esophagram. IMPRESSION: Tracheostomy tube tip projects over the trachea at the level of the clavicles. No pneumomediastinum or pneumothorax. Electronically Signed   By: Andrea Gasman M.D.   On: 03/27/2024 17:30    There are no new results to review at this time.  Previous records (including but not limited to H&P, progress notes, nursing notes, TOC management) were reviewed in assessment of this patient.  Labs: CBC: No results for input(s): WBC, NEUTROABS, HGB, HCT, MCV, PLT in the last 168 hours. Basic Metabolic Panel: No results for input(s): NA, K, CL, CO2, GLUCOSE, BUN, CREATININE, CALCIUM , MG, PHOS in the last 168 hours. Liver Function Tests: No results for input(s): AST, ALT, ALKPHOS, BILITOT, PROT, ALBUMIN  in the last 168 hours. CBG: Recent Labs  Lab 04/19/24 1113 04/19/24 1716 04/20/24 0008 04/20/24 0546 04/20/24 1158  GLUCAP 111* 88 106* 106* 130*    Scheduled Meds:  atorvastatin   40 mg Per Tube q1800   diclofenac Sodium  2 g Topical QID   docusate  100 mg Per Tube BID   famotidine  20 mg Per Tube BID    free water  125 mL Per Tube Q6H   glycopyrrolate   1 mg Per Tube TID   levETIRAcetam  500 mg Per Tube BID   methocarbamol   500 mg Per Tube Q8H   metoprolol  tartrate  37.5 mg Per Tube BID   mouth rinse  15 mL Mouth Rinse 4 times per day   polyethylene glycol  17 g Per Tube Daily   scopolamine  1 patch Transdermal Q72H   Continuous Infusions:  feeding supplement (OSMOLITE 1.5 CAL) 50 mL/hr at 04/24/24 1057   PRN Meds:.acetaminophen , guaiFENesin -dextromethorphan, hydrALAZINE , HYDROmorphone  (DILAUDID ) injection, ondansetron  (ZOFRAN ) IV, mouth rinse, oxyCODONE , sodium bicarbonate/sodium chloride   Family Communication: Not at bedside  Disposition: Status is: Inpatient Remains inpatient appropriate because: Disposition     Time spent: 30 minutes  Length of inpatient stay: 28 days  Author: Carliss LELON Canales, DO 04/24/2024 12:05 PM  For on call review [email or web address].

## 2024-04-24 NOTE — Progress Notes (Signed)
 Daily Progress Note   Patient Name: Stephen Bowen       Date: 04/24/2024 DOB: 09/08/1955  Age: 68 y.o. MRN#: [id number] Attending Physician: Arlon Carliss ORN, DO Primary Care Physician: Trinidad Glisson, MD Admit Date: 03/27/2024 Length of Stay: 28 days  Reason for Consultation/Follow-up: Establishing goals of care  Subjective:   Reviewed EMR including personal review of pertinent labs, imaging, and daily care notes.  Reviewed recent documentation from hospitalist, TOC, and bedside care team.    plan is for patient return to LTC SNF once trach has been in place for 30 days.  This will occur on November 3.  I saw and examined Stephen Bowen today.  He was lying in bed in no distress at time of my arrival.     Objective:   Vital Signs:  BP 102/78 (BP Location: Left Arm)   Pulse (!) 108   Temp 98.2 F (36.8 C) (Oral)   Resp 16   Ht 5' 6 (1.676 m)   Wt 40.4 kg   SpO2 96%   BMI 14.38 kg/m   Physical Exam: General: Frail and chronically ill-appearing, no distress  cardiovascular: Regular rate and rhythm Respiratory: Trach collar in place, no increased work of breathing, lungs diminished extremities: Bilateral BKA Neuro: Awake and alert.  Tracks and follows commands.  Assessment & Plan:   Assessment: Patient is a 68 year old male with a past medical history of squamous cell carcinoma of the pharynx status post tracheostomy, failure to thrive  status post PEG tube placement, severe protein calorie malnutrition, PAD, CAD, history of CVA, history of tobacco use, history of alcohol use, diabetes mellitus type 2, seizure disorder, dementia, bilateral BKA and long-term care resident [facility] in [place] who was admitted on 03/27/2024 after undergoing elective pharyngeal mass biopsy that unfortunately began to ooze requiring cauterization and due to the risk of recurrent bleeding after extubation, decision was made to proceed with tracheostomy placement.  Biopsy showed new  diagnosis of hypopharyngeal squamous cell carcinoma, stage III.  Oncology and radiation oncology consulted for recommendations and evaluation.  Palliative medicine team consulted to assist with complex medical decision making.   Recommendations/Plan: # Complex medical decision making/goals of care:   -Patient today states that he is most frustrated by the fact that he has no quality of life, he shrugs his shoulders, throws up his hands in the air, states that he cannot eat, he feels stuck.   -  Code Status: Limited: Do not attempt resuscitation (DNR) -DNR-LIMITED -Do Not Intubate/DNI     # Symptom management   - Secretions   - Continue scopolamine 1 mg transdermal every 72 hours   - Continue glycopyrrolate  tablet 1 mg 3 times daily via PEG   - Pain   - Continue oxycodone  5 mg every 4 hours as needed  # Psycho-social/Spiritual Support:  - Support System: Stephen Bowen noted she is patient's Hotel Manager   # Discharge Planning: Return to LTC SNF; [facility] assisting with discharge coordination   Thank you for allowing the palliative care team to participate in the care Stephen Bowen. Mod MDM [name] MD Palliative Care Provider PMT # [phone]  If patient remains symptomatic despite maximum doses, please call PMT at [phone] between 0700 and 1900. Outside of these hours, please call attending, as PMT does not have night coverage.   I personally spent a total of 55 minutes in the care of the patient today including preparing to see the patient, getting/reviewing separately  obtained history, performing a medically appropriate exam/evaluation, counseling and educating, documenting clinical information in the EHR, and independently interpreting results.

## 2024-04-25 DIAGNOSIS — C140 Malignant neoplasm of pharynx, unspecified: Secondary | ICD-10-CM | POA: Diagnosis not present

## 2024-04-25 DIAGNOSIS — J392 Other diseases of pharynx: Secondary | ICD-10-CM | POA: Diagnosis not present

## 2024-04-25 DIAGNOSIS — Z930 Tracheostomy status: Secondary | ICD-10-CM | POA: Diagnosis not present

## 2024-04-25 DIAGNOSIS — E43 Unspecified severe protein-calorie malnutrition: Secondary | ICD-10-CM | POA: Diagnosis not present

## 2024-04-25 DIAGNOSIS — R6889 Other general symptoms and signs: Secondary | ICD-10-CM | POA: Diagnosis not present

## 2024-04-25 LAB — GLUCOSE, CAPILLARY: Glucose-Capillary: 138 mg/dL — ABNORMAL HIGH (ref 70–99)

## 2024-04-25 MED ORDER — GLYCOPYRROLATE 0.2 MG/ML IJ SOLN
0.2000 mg | Freq: Four times a day (QID) | INTRAMUSCULAR | Status: DC | PRN
  Filled 2024-04-25: qty 1

## 2024-04-25 NOTE — Progress Notes (Signed)
 PT currently sleeping- no respiratory distress noted. Encouraged RN to call RT if and questions / concerns. Vitals and assessment are documented for this visit.

## 2024-04-25 NOTE — Progress Notes (Addendum)
 Pt refusing trach care or suctioning from RT.  PT's sat were 89%.  PT started to spontaneously cough and clear a lot of thick white pink tinged secretions.  PT suctioned it himself via mouth and collar.  Sats increased to 95%, HR 120. RN updated.

## 2024-04-25 NOTE — Progress Notes (Signed)
 RT called to the room to tracheal suction pt. Stephen Bowen declined suctioning and all respiratory care at this time.  Continuous pulse ox @ bedside: 100%, 28% ATC.

## 2024-04-25 NOTE — Plan of Care (Signed)
   Problem: Safety: Goal: Ability to remain free from injury will improve Outcome: Progressing

## 2024-04-25 NOTE — Progress Notes (Signed)
 Daily Progress Note   Patient Name: Stephen Bowen       Date: 04/25/2024 DOB: [birth year]  Age: 68 y.o. MRN#: [id number] Attending Physician: Arlon Carliss ORN, DO Primary Care Physician: Trinidad Glisson, MD Admit Date: 03/27/2024 Length of Stay: 29 days  Reason for Consultation/Follow-up: Establishing goals of care  Subjective:   Reviewed EMR including personal review of pertinent labs, imaging, and daily care notes.  Reviewed recent documentation from hospitalist, TOC, and bedside care team.    plan is for patient return to LTC SNF once trach has been in place for 30 days.  This will occur on November 3.  I saw and examined Mr. Porzio today.  He was lying in bed and nursing colleagues at bedside, high heart rate, increased thick secretions around trach site, patient doesn't mouth words or verbalize, avoids eye contact.    Objective:   Vital Signs:  BP (!) 144/87 (BP Location: Right Arm)   Pulse (!) 137   Temp 98.2 F (36.8 C)   Resp 18   Ht 5' 6 (1.676 m)   Wt 41.1 kg   SpO2 98%   BMI 14.62 kg/m   Physical Exam: General: Frail and chronically ill-appearing, no distress  cardiovascular: Regular rate and rhythm Respiratory: Trach collar in place, mildly increased work of breathing, lungs diminished extremities: Bilateral BKA Neuro: Awake and alert.  Tracks and follows commands.  Assessment & Plan:   Assessment: Patient is a 68 year old male with a past medical history of squamous cell carcinoma of the pharynx status post tracheostomy, failure to thrive  status post PEG tube placement, severe protein calorie malnutrition, PAD, CAD, history of CVA, history of tobacco use, history of alcohol use, diabetes mellitus type 2, seizure disorder, dementia, bilateral BKA and long-term care resident [facility] in [place] who was admitted on 03/27/2024 after undergoing elective pharyngeal mass biopsy that unfortunately began to ooze requiring cauterization and due to the  risk of recurrent bleeding after extubation, decision was made to proceed with tracheostomy placement.  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  Oncology and radiation oncology consulted for recommendations and evaluation.  Palliative medicine team consulted to assist with complex medical decision making.   Recommendations/Plan: # Complex medical decision making/goals of care:   -Patient today with high heart rate, increased secretions around trach site, RN colleagues at bedside.   -  Code Status: Limited: Do not attempt resuscitation (DNR) -DNR-LIMITED -Do Not Intubate/DNI     # Symptom management   - Secretions   - Continue scopolamine 1 mg transdermal every 72 hours   - Continue glycopyrrolate  tablet 1 mg 3 times daily via PEG Add IV PRN Robinul  for secretions.    - Pain   - Continue oxycodone  5 mg every 4 hours as needed  # Psycho-social/Spiritual Support:  - Support System: Sister-Wendy Villacres noted she is patient's Hotel Manager   # Discharge Planning: Return to LTC SNF; [facility] assisting with discharge coordination   Thank you for allowing the palliative care team to participate in the care [name]. Mod MDM [name] MD Palliative Care Provider PMT # [phone]  If patient remains symptomatic despite maximum doses, please call PMT at [phone] between 0700 and 1900. Outside of these hours, please call attending, as PMT does not have night coverage.   I personally spent a total of 55 minutes in the care of the patient today including preparing to see the patient, getting/reviewing separately obtained  history, performing a medically appropriate exam/evaluation, counseling and educating, documenting clinical information in the EHR, and independently interpreting results.

## 2024-04-25 NOTE — Progress Notes (Signed)
 Progress Note   Patient: Stephen Bowen FMW:[id number] DOB: December 31, 1955 DOA: 03/27/2024  DOS: the patient was seen and examined on 04/25/2024   Brief hospital course:  68 y.o. male with past medical history significant for CAD/PAD, DM2, HTN, HLD, questionable dementia who presented to [facility] ED on 03/27/2024 for biopsy of pharyngeal mass and tracheostomy placement.  Patient underwent posterior pharyngeal mass biopsy with tracheostomy by ENT, Dr. Linell on 03/27/2024; with frozen section during case positive for SCC.  Patient was admitted to the intensive care unit/critical care service postoperatively.   Significant hospital events: 10/3: laryngoscopy, biopsy, tracheostomy; admitted to ICU/PCCM postoperatively  10/6: gtube placement by Dr. Ebbie; transferred to 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 Postoperatively admitted to the pulmonary critical care service/ICU. Pathology consistent with squamous cell carcinoma. Seen by medical oncology, Dr. Conchetta on 10/10; patient not interested in chemotherapy. Radiation oncology was consulted with recommendation of 7 weeks of radiation therapy which patient has been noncompliant and now has been discontinued.  Continues on trach care, 6 L via trach collar.  Scopolamine, glycopyrrolate  for thick secretions.  Followed by palliative care as well.  Providencia/Klebsiella tracheostomy infection-resolved - Completed 7-day course of ceftriaxone.  Seizure disorder - Keppra 500 mg per tube twice daily.  Hypertension - Metoprolol  titrate 37.5 mg per tube twice daily  Diabetes mellitus -  Insulin  sliding scale board.  PAD s/p BL AKA/CAD - Noted.  Statin on board.  Severe protein calorie malnutrition/adult failure to thrive  - Secondary to above.  G-tube placed 10/6.  Continues on tube feeds.  Goals of care - Patient continues to be noncompliant with medications, refusing therapy from oncology and radiology.  Followed by palliative care.  Recommending hospice if the patient does not want to pursue further aggressive treatment.  In the meantime, patient cannot be discharged back to SNF until 11/3.  Subjective: Patient resting comfortably this morning.  No acute events overnight.  Continues to refuse medications for lab draws.  Noted to have tachycardia this morning.  Thick secretions around trach per usual.  Not mouthing words but responsive, nodding yes and no to questions.   Physical Exam:  Vitals:   04/25/24 0100 04/25/24 0458 04/25/24 0833 04/25/24 1024  BP:  103/81  (!) 144/87  Pulse: (!) 109 (!) 105 (!) 115 (!) 137  Resp: 16 18 18    Temp:  98.2 F (36.8 C)    TempSrc:      SpO2: 97% 96% 93% 98%  Weight:  41.1 kg    Height:        GENERAL:  Alert, pleasant, no acute distress, cachectic HEENT:  EOMI, tracheostomy CARDIOVASCULAR:  RRR, no murmurs appreciated RESPIRATORY:  Clear to auscultation, no wheezing, rales, or rhonchi GASTROINTESTINAL: G-tube, soft, nontender, nondistended EXTREMITIES: Thin, BL AKA NEURO:  No new focal deficits appreciated SKIN:  No rashes noted PSYCH:  Appropriate mood and affect     Data Reviewed:  Imaging Studies: CT ANGIO NECK W OR WO CONTRAST Result Date: 03/29/2024 EXAM: CTA Neck 03/29/2024 05:39:44 AM TECHNIQUE: CT of the neck was performed without and with the administration of 75 mL of iohexol (OMNIPAQUE) 350 MG/ML injection. Multiplanar 2D and/or 3D  reformatted images are provided for review. Automated exposure control, iterative reconstruction, and/or weight based adjustment of the mA/kV was utilized to reduce the  radiation dose to as low as reasonably achievable. Stenosis of the internal carotid arteries measured using NASCET criteria. COMPARISON: CT of the neck dated 02/27/2024 and PET/CT fusion study dated 03/09/2024. CLINICAL HISTORY: Pharyngeal cancer, oral bleeding need to evaluate for major vessel invasion by the tumor extravasation. FINDINGS: AORTIC ARCH AND ARCH VESSELS: No dissection or arterial injury. No significant stenosis of the brachiocephalic or subclavian arteries. CERVICAL CAROTID ARTERIES: No dissection, arterial injury, or hemodynamically significant stenosis by NASCET criteria. No encasement of the carotid arteries or proximal branches of the external carotid arteries. CERVICAL VERTEBRAL ARTERIES: No dissection, arterial injury, or significant stenosis. LUNGS AND MEDIASTINUM: Since the previous studies, a tracheostomy tube has been placed and its tip is in an appropriate position within the trachea. SOFT TISSUES: There is nodular thickening and enhancement again demonstrated within the posterior pharyngeal wall of the oral pharynx and hypopharynx bilaterally, more extensive on the left. There is no evidence of active bleeding. BONES: No acute abnormality. IMPRESSION: 1. Nodular thickening and enhancement within the posterior pharyngeal wall of the oral and hypopharynx bilaterally, more extensive on the left, without evidence of active bleeding or major vessel invasion 2. Tracheostomy tube in appropriate position within the trachea Electronically signed by: Evalene Coho MD 03/29/2024 06:38 AM EDT RP Workstation: [id number]   CT ABDOMEN WO CONTRAST Result Date: 03/29/2024 CLINICAL DATA:  Assess G-tube placement. Head and neck cancer. * Tracking Code: BO * EXAM: CT ABDOMEN WITHOUT CONTRAST TECHNIQUE: Multidetector CT imaging of the abdomen was performed following the standard protocol without IV contrast. RADIATION DOSE REDUCTION: This exam was performed according to the departmental  dose-optimization program which includes automated exposure control, adjustment of the mA and/or kV according to patient size and/or use of iterative reconstruction technique. COMPARISON:  PET-CT 03/09/2024 FINDINGS: Image quality degraded by beam hardening artifact emanating from retained barium contrast material in the colon . Lower chest: There is debris in the bronchus intermedius and left lower lobe airway. Central collapse/consolidative opacity is seen in the left upper lobe (image 9/series 6 with peripheral airway impaction seen in the posterior lower lobes bilaterally. Dependent atelectasis noted in both lung bases with trace bilateral pleural effusions. Hepatobiliary: No suspicious focal abnormality in the liver on this study without intravenous contrast. Gallbladder is distended. Tiny calcified gallstones evident. Porta hepatis, pancreatic head, and descending duodenum obscured by beam hardening artifact. Pancreas: Pancreas essentially obscured by beam hardening artifact from retained barium contrast material in the colon. Spleen: No splenomegaly. No suspicious focal mass lesion. Adrenals/Urinary Tract: No adrenal nodule or mass. Right kidney unremarkable. Tiny nonobstructing stone identified lower pole left kidney. Stomach/Bowel: Stomach is nondistended in largely obscured by artifact. As above, descending duodenum is obscured. No small bowel dilatation within the visualized abdomen. Diffuse colonic distension noted with retained contrast material. Vascular/Lymphatic: There is moderate atherosclerotic calcification of the abdominal aorta without aneurysm. No retroperitoneal lymphadenopathy. Gastrohepatic and hepatoduodenal ligaments are largely obscured by beam hardening artifact. Other: No intraperitoneal free fluid. Musculoskeletal: No worrisome lytic or sclerotic osseous abnormality. Old posterior left rib fractures evident. Compression deformity at T11 and T12 is similar to chest CT of 02/20/2024.  IMPRESSION: 1. Image quality degraded by beam hardening artifact emanating from retained barium contrast material in the colon. 2. Debris in the bronchus intermedius and left lower lobe airway with central collapse/consolidative opacity in the left upper lobe.  Imaging features concerning for aspiration with associated infectious/inflammatory consolidative opacity in the central left lower lobe. 3. Stomach is nondistended and largely obscured by artifact. 4. Diffuse colonic distension with retained contrast material. 5. Cholelithiasis. 6. Tiny nonobstructing stone lower pole left kidney. 7.  Aortic Atherosclerosis (ICD10-I70.0). Electronically Signed   By: Camellia Candle M.D.   On: 03/29/2024 06:02   DG Chest Port 1 View Result Date: 03/27/2024 CLINICAL DATA:  Status post tracheostomy. EXAM: PORTABLE CHEST 1 VIEW COMPARISON:  PET CT 03/09/2024 FINDINGS: Tracheostomy tube tip projects over the trachea at the level of the clavicles. No evidence of pneumomediastinum or pneumothorax. The heart is normal in size. No pleural effusion or focal airspace disease. Multiple remote left rib fractures. Barium in the colon from prior esophagram. IMPRESSION: Tracheostomy tube tip projects over the trachea at the level of the clavicles. No pneumomediastinum or pneumothorax. Electronically Signed   By: Andrea Gasman M.D.   On: 03/27/2024 17:30    There are no new results to review at this time.  Previous records (including but not limited to H&P, progress notes, nursing notes, TOC management) were reviewed in assessment of this patient.  Labs: CBC: No results for input(s): WBC, NEUTROABS, HGB, HCT, MCV, PLT in the last 168 hours. Basic Metabolic Panel: No results for input(s): NA, K, CL, CO2, GLUCOSE, BUN, CREATININE, CALCIUM , MG, PHOS in the last 168 hours. Liver Function Tests: No results for input(s): AST, ALT, ALKPHOS, BILITOT, PROT, ALBUMIN  in the last 168  hours. CBG: Recent Labs  Lab 04/19/24 1716 04/20/24 0008 04/20/24 0546 04/20/24 1158 04/25/24 1028  GLUCAP 88 106* 106* 130* 138*    Scheduled Meds:  atorvastatin   40 mg Per Tube q1800   diclofenac Sodium  2 g Topical QID   docusate  100 mg Per Tube BID   famotidine  20 mg Per Tube BID   free water  125 mL Per Tube Q6H   glycopyrrolate   1 mg Per Tube TID   levETIRAcetam  500 mg Per Tube BID   methocarbamol   500 mg Per Tube Q8H   metoprolol  tartrate  37.5 mg Per Tube BID   mouth rinse  15 mL Mouth Rinse 4 times per day   polyethylene glycol  17 g Per Tube Daily   scopolamine  1 patch Transdermal Q72H   Continuous Infusions:  feeding supplement (OSMOLITE 1.5 CAL) 50 mL/hr at 04/24/24 1057   PRN Meds:.acetaminophen , glycopyrrolate , guaiFENesin -dextromethorphan, hydrALAZINE , HYDROmorphone  (DILAUDID ) injection, ondansetron  (ZOFRAN ) IV, mouth rinse, oxyCODONE , sodium bicarbonate/sodium chloride   Family Communication: Not at bedside  Disposition: Status is: Inpatient Remains inpatient appropriate because: Disposition     Time spent: 31 minutes  Length of inpatient stay: 29 days  Author: Carliss LELON Canales, DO 04/25/2024 12:49 PM  For on call review [email or web address].

## 2024-04-26 DIAGNOSIS — J392 Other diseases of pharynx: Secondary | ICD-10-CM | POA: Diagnosis not present

## 2024-04-26 NOTE — Progress Notes (Signed)
  Daily Progress Note   Patient Name: Stephen Bowen       Date: 04/26/2024 DOB: 03-Jan-1956  Age: 68 y.o. MRN#: [id number] Attending Physician: Stephen Bowen LABOR, MD Primary Care Physician: Stephen Glisson, MD Admit Date: 03/27/2024 Length of Stay: 30 days  Reason for Consultation/Follow-up: Establishing goals of care  Subjective:   Reviewed EMR including personal review of pertinent labs, imaging, and daily care notes.  Reviewed recent documentation from hospitalist, TOC, and bedside care team.    plan is for patient return to LTC SNF once trach has been in place for 30 days.  This will occur on November 3. Patient noted to be resting in bed, no distress. RT note reviewed.      Objective:   Vital Signs:  BP 98/75   Pulse 98   Temp 98 F (36.7 C)   Resp 18   Ht 5' 6 (1.676 m)   Wt 41.1 kg   SpO2 98%   BMI 14.62 kg/m   Physical Exam: General: Frail and chronically ill-appearing, no distress  cardiovascular: Regular rate and rhythm Respiratory: Trach collar in place, mildly increased work of breathing, lungs diminished extremities: Bilateral BKA Neuro: Awake and alert.  Tracks and follows commands.  Assessment & Plan:   Assessment: Patient is a 68 year old male with a past medical history of squamous cell carcinoma of the pharynx status post tracheostomy, failure to thrive  status post PEG tube placement, severe protein calorie malnutrition, PAD, CAD, history of CVA, history of tobacco use, history of alcohol use, diabetes mellitus type 2, seizure disorder, dementia, bilateral BKA and long-term care resident [facility] in [place] who was admitted on 03/27/2024 after undergoing elective pharyngeal mass biopsy that unfortunately began to ooze requiring cauterization and due to the risk of recurrent bleeding after extubation, decision was made to proceed with tracheostomy placement.  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   Oncology and radiation oncology consulted for recommendations and evaluation.  Palliative medicine team consulted to assist with complex medical decision making.   Recommendations/Plan: # Complex medical decision making/goals of care:   -Patient today with high heart rate, increased secretions around trach site, RN colleagues at bedside.   -  Code Status: Limited: Do not attempt resuscitation (DNR) -DNR-LIMITED -Do Not Intubate/DNI     # Symptom management   - Secretions   - Continue scopolamine 1 mg transdermal every 72 hours   - Continue glycopyrrolate  tablet 1 mg 3 times daily via PEG Also on IV PRN Robinul  for secretions.    - Pain   - Continue oxycodone  5 mg every 4 hours as needed  # Psycho-social/Spiritual Support:  - Support System: Sister-Stephen Bowen noted she is patient's Hotel Manager   # Discharge Planning: Return to LTC SNF; [facility] assisting with discharge coordination   Thank you for allowing the palliative care team to participate in the care Stephen Bowen. low MDM Stephen Serve MD Palliative Care Provider PMT # [phone]  If patient remains symptomatic despite maximum doses, please call PMT at [phone] between 0700 and 1900. Outside of these hours, please call attending, as PMT does not have night coverage.   I personally spent a total of 55 minutes in the care of the patient today including preparing to see the patient, getting/reviewing separately obtained history, performing a medically appropriate exam/evaluation, counseling and educating, documenting clinical information in the EHR, and independently interpreting results.

## 2024-04-26 NOTE — Progress Notes (Signed)
 Progress Note   Patient: Stephen Bowen FMW:[id number] DOB: [birth year] DOA: 03/27/2024  DOS: the patient was seen and examined on 04/26/2024   Brief hospital course: 68 y.o. male with past medical history significant for CAD/PAD, DM2, HTN, HLD, questionable dementia who presented to [facility] ED on 03/27/2024 for biopsy of pharyngeal mass and tracheostomy placement.  Patient underwent posterior pharyngeal mass biopsy with tracheostomy by ENT, Dr. Linell on 03/27/2024; with frozen section during case positive for SCC.  Patient was admitted to the intensive care unit/critical care service postoperatively.   Significant hospital events: 10/3: laryngoscopy, biopsy, tracheostomy; admitted to ICU/PCCM postoperatively  10/6: gtube placement by Dr. 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  -Postoperatively admitted to the pulmonary critical care service/ICU. - Pathology consistent with squamous cell carcinoma. - Seen by medical oncology, Dr. Conchetta on 10/10; patient not interested in chemotherapy.  -Radiation oncology was consulted with recommendation of 7 weeks of radiation therapy which patient has been noncompliant and now has been discontinued.  - Continues on trach care, 6 L via trach collar.  Scopolamine, glycopyrrolate  for thick secretions.  -Palliative care following.  Needs placement. SW consulted.   Providencia/Klebsiella tracheostomy infection-resolved - Completed 7-day course of ceftriaxone.  Seizure disorder - Continue with Keppra.   Hypertension continue with  Metoprolol   Diabetes mellitus -  SSI  PAD s/p BL AKA/CAD - Noted.  Continue with Statin   Severe protein calorie malnutrition/adult failure to thrive  - Secondary to above.  G-tube placed 10/6.  Continues on tube feeds.  Goals of care - Patient continues to be noncompliant with medications, refusing therapy from oncology and radiology.  Followed by palliative care.  Recommending hospice if the patient does not want to pursue further aggressive treatment.  In the meantime, plan to discharged back to SNF until 11/3.  Subjective:  He is alert, agreed to have trach care this am.  I told him he could go to rehab as soon as tomorrow. Will have SW to follow up with patient.  I recommend him hospice care, if he doesn't want tx.    Physical Exam:  Vitals:   04/25/24 1300 04/25/24 1524 04/25/24 1956 04/26/24 0517  BP:   111/83 98/75  Pulse: (!) 122 (!) 119 (!) 117 (!) 102  Resp: 18 17  18   Temp:    98 F (36.7 C)  TempSrc:      SpO2: (!) 89% 93% 100% 97%  Weight:      Height:        GENERAL: NAD HEENT:  Tach in placed CARDIOVASCULAR:  S 1 S 2 RRR RESPIRATORY:  BL ronchus.  GASTROINTESTINAL: BS present, soft, , G tube in placed EXTREMITIES: BL BKA>     Data Reviewed:  Imaging Studies: CT ANGIO NECK W OR WO CONTRAST Result Date: 03/29/2024 EXAM: CTA Neck 03/29/2024 05:39:44 AM TECHNIQUE: CT of the neck was performed without and with the administration of 75 mL of iohexol (OMNIPAQUE) 350 MG/ML injection. Multiplanar 2D and/or 3D reformatted images are provided for review. Automated exposure control, iterative reconstruction, and/or weight based adjustment of the mA/kV was utilized to reduce the radiation dose to as low as reasonably  achievable. Stenosis of the internal carotid arteries measured using NASCET criteria. COMPARISON: CT of the neck dated 02/27/2024 and PET/CT fusion study dated 03/09/2024. CLINICAL HISTORY: Pharyngeal cancer, oral bleeding need to evaluate for major vessel invasion by the tumor  extravasation. FINDINGS: AORTIC ARCH AND ARCH VESSELS: No dissection or arterial injury. No significant stenosis of the brachiocephalic or subclavian arteries. CERVICAL CAROTID ARTERIES: No dissection, arterial injury, or hemodynamically significant stenosis by NASCET criteria. No encasement of the carotid arteries or proximal branches of the external carotid arteries. CERVICAL VERTEBRAL ARTERIES: No dissection, arterial injury, or significant stenosis. LUNGS AND MEDIASTINUM: Since the previous studies, a tracheostomy tube has been placed and its tip is in an appropriate position within the trachea. SOFT TISSUES: There is nodular thickening and enhancement again demonstrated within the posterior pharyngeal wall of the oral pharynx and hypopharynx bilaterally, more extensive on the left. There is no evidence of active bleeding. BONES: No acute abnormality. IMPRESSION: 1. Nodular thickening and enhancement within the posterior pharyngeal wall of the oral and hypopharynx bilaterally, more extensive on the left, without evidence of active bleeding or major vessel invasion 2. Tracheostomy tube in appropriate position within the trachea Electronically signed by: Evalene Coho MD 03/29/2024 06:38 AM EDT RP Workstation: [id number]   CT ABDOMEN WO CONTRAST Result Date: 03/29/2024 CLINICAL DATA:  Assess G-tube placement. Head and neck cancer. * Tracking Code: BO * EXAM: CT ABDOMEN WITHOUT CONTRAST TECHNIQUE: Multidetector CT imaging of the abdomen was performed following the standard protocol without IV contrast. RADIATION DOSE REDUCTION: This exam was performed according to the departmental dose-optimization program which includes automated exposure control, adjustment of the mA and/or kV according to patient size and/or use of iterative reconstruction technique. COMPARISON:  PET-CT 03/09/2024 FINDINGS: Image quality degraded by beam hardening artifact emanating from retained barium contrast material in the colon .  Lower chest: There is debris in the bronchus intermedius and left lower lobe airway. Central collapse/consolidative opacity is seen in the left upper lobe (image 9/series 6 with peripheral airway impaction seen in the posterior lower lobes bilaterally. Dependent atelectasis noted in both lung bases with trace bilateral pleural effusions. Hepatobiliary: No suspicious focal abnormality in the liver on this study without intravenous contrast. Gallbladder is distended. Tiny calcified gallstones evident. Porta hepatis, pancreatic head, and descending duodenum obscured by beam hardening artifact. Pancreas: Pancreas essentially obscured by beam hardening artifact from retained barium contrast material in the colon. Spleen: No splenomegaly. No suspicious focal mass lesion. Adrenals/Urinary Tract: No adrenal nodule or mass. Right kidney unremarkable. Tiny nonobstructing stone identified lower pole left kidney. Stomach/Bowel: Stomach is nondistended in largely obscured by artifact. As above, descending duodenum is obscured. No small bowel dilatation within the visualized abdomen. Diffuse colonic distension noted with retained contrast material. Vascular/Lymphatic: There is moderate atherosclerotic calcification of the abdominal aorta without aneurysm. No retroperitoneal lymphadenopathy. Gastrohepatic and hepatoduodenal ligaments are largely obscured by beam hardening artifact. Other: No intraperitoneal free fluid. Musculoskeletal: No worrisome lytic or sclerotic osseous abnormality. Old posterior left rib fractures evident. Compression deformity at T11 and T12 is similar to chest CT of 02/20/2024. IMPRESSION: 1. Image quality degraded by beam hardening artifact emanating from retained barium contrast material in the colon. 2. Debris in the bronchus intermedius and left lower lobe airway with central collapse/consolidative opacity in the left upper lobe. Imaging features concerning for aspiration with associated  infectious/inflammatory consolidative opacity in the central left lower lobe. 3. Stomach is nondistended and largely obscured by artifact. 4. Diffuse colonic distension with  retained contrast material. 5. Cholelithiasis. 6. Tiny nonobstructing stone lower pole left kidney. 7.  Aortic Atherosclerosis (ICD10-I70.0). Electronically Signed   By: Camellia Candle M.D.   On: 03/29/2024 06:02   DG Chest Port 1 View Result Date: 03/27/2024 CLINICAL DATA:  Status post tracheostomy. EXAM: PORTABLE CHEST 1 VIEW COMPARISON:  PET CT 03/09/2024 FINDINGS: Tracheostomy tube tip projects over the trachea at the level of the clavicles. No evidence of pneumomediastinum or pneumothorax. The heart is normal in size. No pleural effusion or focal airspace disease. Multiple remote left rib fractures. Barium in the colon from prior esophagram. IMPRESSION: Tracheostomy tube tip projects over the trachea at the level of the clavicles. No pneumomediastinum or pneumothorax. Electronically Signed   By: Andrea Gasman M.D.   On: 03/27/2024 17:30    There are no new results to review at this time.  Previous records (including but not limited to H&P, progress notes, nursing notes, TOC management) were reviewed in assessment of this patient.  Labs: CBC: No results for input(s): WBC, NEUTROABS, HGB, HCT, MCV, PLT in the last 168 hours. Basic Metabolic Panel: No results for input(s): NA, K, CL, CO2, GLUCOSE, BUN, CREATININE, CALCIUM , MG, PHOS in the last 168 hours. Liver Function Tests: No results for input(s): AST, ALT, ALKPHOS, BILITOT, PROT, ALBUMIN  in the last 168 hours. CBG: Recent Labs  Lab 04/19/24 1716 04/20/24 0008 04/20/24 0546 04/20/24 1158 04/25/24 1028  GLUCAP 88 106* 106* 130* 138*    Scheduled Meds:  atorvastatin   40 mg Per Tube q1800   diclofenac Sodium  2 g Topical QID   docusate  100 mg Per Tube BID   famotidine  20 mg Per Tube BID   free water  125 mL Per  Tube Q6H   glycopyrrolate   1 mg Per Tube TID   levETIRAcetam  500 mg Per Tube BID   methocarbamol   500 mg Per Tube Q8H   metoprolol  tartrate  37.5 mg Per Tube BID   mouth rinse  15 mL Mouth Rinse 4 times per day   polyethylene glycol  17 g Per Tube Daily   scopolamine  1 patch Transdermal Q72H   Continuous Infusions:  feeding supplement (OSMOLITE 1.5 CAL) 50 mL/hr at 04/24/24 1057   PRN Meds:.acetaminophen , glycopyrrolate , guaiFENesin -dextromethorphan, hydrALAZINE , HYDROmorphone  (DILAUDID ) injection, ondansetron  (ZOFRAN ) IV, mouth rinse, oxyCODONE , sodium bicarbonate/sodium chloride   Family Communication: Not at bedside  Disposition: Status is: Inpatient Remains inpatient appropriate because: Disposition     Time spent: 31 minutes  Length of inpatient stay: 30 days  Author: Owen DELENA Lore, MD 04/26/2024 8:18 AM  For on call review [email or web address].

## 2024-04-26 NOTE — Progress Notes (Signed)
 PT denied need for trach suctioning. PT did perform several strong coughs that resulted in large, clear- white, thick mucus. BBS clear diminished at this time.

## 2024-04-26 NOTE — Plan of Care (Signed)
  Problem: Safety: Goal: Ability to remain free from injury will improve Outcome: Progressing   Problem: Fluid Volume: Goal: Ability to maintain a balanced intake and output will improve Outcome: Progressing

## 2024-04-27 ENCOUNTER — Ambulatory Visit

## 2024-04-27 DIAGNOSIS — J392 Other diseases of pharynx: Secondary | ICD-10-CM | POA: Diagnosis not present

## 2024-04-27 DIAGNOSIS — R6889 Other general symptoms and signs: Secondary | ICD-10-CM | POA: Diagnosis not present

## 2024-04-27 DIAGNOSIS — C140 Malignant neoplasm of pharynx, unspecified: Secondary | ICD-10-CM | POA: Diagnosis not present

## 2024-04-27 DIAGNOSIS — Z930 Tracheostomy status: Secondary | ICD-10-CM | POA: Diagnosis not present

## 2024-04-27 NOTE — Progress Notes (Signed)
 Trach check completed.

## 2024-04-27 NOTE — Progress Notes (Signed)
 Patient continues to refuse all nursing care and medications from nursing staff. Will continue to monitor

## 2024-04-27 NOTE — Progress Notes (Addendum)
 Trach checked completed. PT doing well at this time.

## 2024-04-27 NOTE — TOC Progression Note (Addendum)
 Transition of Care [facility] - Progression Note    Patient Details  Name: Stephen Bowen MRN: [id number] Date of Birth: 1956-04-18  [facility]) CM/SW Contact  Toy LITTIE Agar, RN Phone Number:[phone]  04/27/2024, 10:13 AM  Clinical Narrative:   CM has called [facility] to confirm that patient is able to return. CM spoke with Sherri [phone]). Per Sherri she will have the nurse specialist review patients information and get back with CM.   1344 CM has received follow up call from [facility] stating that facility will be able to accept patient but they has to order high volume compressor and it will not arrive until tomorrow. Message to be sent to team to update them.    Expected Discharge Plan: Skilled Nursing Facility Barriers to Discharge: Continued Medical Work up               Expected Discharge Plan and Services In-house Referral: Clinical Social Work     Living arrangements for the past 2 months: Skilled Nursing Facility                                       Social Drivers of Health (SDOH) Interventions SDOH Screenings   Food Insecurity: Patient Unable To Answer (03/27/2024)  Housing: Low Risk  (03/27/2024)  Transportation Needs: No Transportation Needs (03/27/2024)  Utilities: Not At Risk (03/27/2024)  Depression (PHQ2-9): Low Risk  (03/02/2024)  Social Connections: Patient Unable To Answer (03/27/2024)  Tobacco Use: High Risk (03/30/2024)    Readmission Risk Interventions     No data to display

## 2024-04-27 NOTE — Progress Notes (Signed)
  Daily Progress Note   Patient Name: Stephen Bowen       Date: 04/27/2024 DOB: 23-Apr-1956  Age: 68 y.o. MRN#: [id number] Attending Physician: Madelyne Owen LABOR, MD Primary Care Physician: Trinidad Glisson, MD Admit Date: 03/27/2024 Length of Stay: 31 days  Reason for Consultation/Follow-up: Establishing goals of care  Subjective:   Reviewed EMR including personal review of pertinent labs, imaging, and daily care notes.  Reviewed recent documentation from hospitalist, TOC, and bedside care team.    plan is for patient return to LTC SNF [facility], TOC working on this, note reviewed.  Patient noted to be resting in bed, no distress. RT note reviewed.      Objective:   Vital Signs:  BP 116/83   Pulse (!) 107   Temp 98.1 F (36.7 C) (Oral)   Resp 16   Ht 5' 6 (1.676 m)   Wt 41.1 kg   SpO2 94%   BMI 14.62 kg/m   Physical Exam: General: Frail and chronically ill-appearing, no distress  cardiovascular: Regular rate and rhythm Respiratory: Trach collar in place, mildly increased work of breathing, lungs diminished extremities: Bilateral BKA Neuro: Awake and alert.  Tracks and follows commands.  Assessment & Plan:   Assessment: Patient is a 68 year old male with a past medical history of squamous cell carcinoma of the pharynx status post tracheostomy, failure to thrive  status post PEG tube placement, severe protein calorie malnutrition, PAD, CAD, history of CVA, history of tobacco use, history of alcohol use, diabetes mellitus type 2, seizure disorder, dementia, bilateral BKA and long-term care resident [facility] in [place] who was admitted on 03/27/2024 after undergoing elective pharyngeal mass biopsy that unfortunately began to ooze requiring cauterization and due to the risk of recurrent bleeding after extubation, decision was made to proceed with tracheostomy placement.  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  Oncology and radiation oncology consulted for recommendations and evaluation.  Palliative medicine team consulted to assist with complex medical decision making.   Recommendations/Plan: # Complex medical decision making/goals of care:    -  Code Status: Limited: Do not attempt resuscitation (DNR) -DNR-LIMITED -Do Not Intubate/DNI     # Symptom management   - Secretions   - Continue scopolamine 1 mg transdermal every 72 hours   - Continue glycopyrrolate  tablet 1 mg 3 times daily via PEG Also on IV PRN Robinul  for secretions.    - Pain   - Continue oxycodone  5 mg every 4 hours as needed, no PRNs needed for pain in the past 24 hours.   # Psycho-social/Spiritual Support:  - Support System: Sister-Stephen Bowen noted she is patient's Hotel Manager   # Discharge Planning: Return to LTC SNF; [facility] assisting with discharge coordination, note reviewed.    Thank you for allowing the palliative care team to participate in the care Stephen Bowen. Mod MDM [name] MD Palliative Care Provider PMT # [phone]  If patient remains symptomatic despite maximum doses, please call PMT at [phone] between 0700 and 1900. Outside of these hours, please call attending, as PMT does not have night coverage.

## 2024-04-27 NOTE — Plan of Care (Signed)
°  Problem: Clinical Measurements: Goal: Will remain free from infection Outcome: Progressing   Problem: Education: Goal: Knowledge of General Education information will improve Description: Including pain rating scale, medication(s)/side effects and non-pharmacologic comfort measures Outcome: Not Progressing   Problem: Health Behavior/Discharge Planning: Goal: Ability to manage health-related needs will improve Outcome: Not Progressing   Problem: Clinical Measurements: Goal: Ability to maintain clinical measurements within normal limits will improve Outcome: Not Progressing

## 2024-04-27 NOTE — Progress Notes (Signed)
 Progress Note   Patient: Stephen Bowen FMW:[id number] DOB: 05/19/1956 DOA: 03/27/2024  DOS: the patient was seen and examined on 04/27/2024   Brief hospital course: 68 y.o. male with past medical history significant for CAD/PAD, DM2, HTN, HLD, questionable dementia who presented to [facility] ED on 03/27/2024 for biopsy of pharyngeal mass and tracheostomy placement.  Patient underwent posterior pharyngeal mass biopsy with tracheostomy by ENT, Dr. Linell on 03/27/2024; with frozen section during case positive for SCC.  Patient was admitted to the intensive care unit/critical care service postoperatively.   Significant hospital events: 10/3: laryngoscopy, biopsy, tracheostomy; admitted to ICU/PCCM postoperatively  10/6: gtube placement by Dr. 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  -Postoperatively admitted to the pulmonary critical care service/ICU. - Pathology consistent with squamous cell carcinoma. - Seen by medical oncology, Dr. Conchetta on 10/10; patient not interested in chemotherapy.  -Radiation oncology was consulted with recommendation of 7 weeks of radiation therapy which patient has been noncompliant and now has been discontinued.  - Continues on trach care, 5 L via trach collar.  Scopolamine, glycopyrrolate  for thick secretions.  -Palliative care following.  Needs placement. SW consulted.   Providencia/Klebsiella tracheostomy infection-resolved - Completed 7-day course of ceftriaxone.  Seizure disorder - Continue with Keppra.   Hypertension continue with  Metoprolol   Diabetes mellitus -  SSI  PAD s/p BL AKA/CAD - Noted.  Continue with Statin   Severe protein calorie malnutrition/adult failure to thrive  - Secondary to above.  G-tube placed 10/6.  Continues on tube feeds.  Goals of care - Patient continues to be noncompliant with medications, refusing therapy from oncology and radiology.  Followed by palliative care.  Recommending hospice if the patient does not want to pursue further aggressive treatment.  In the meantime, plan to discharged back to SNF until 11/3.  Subjective:  He is alert, I told him plan is for him to go to his prior facility. I recommend palliative care follow up at facility    Physical Exam:  Vitals:   04/27/24 0742 04/27/24 0921 04/27/24 1120 04/27/24 1356  BP:  116/83  119/82  Pulse: (!) 105 (!) 107  (!) 113  Resp: 18 16  18   Temp:  98.1 F (36.7 C)  98.1 F (36.7 C)  TempSrc:  Oral  Oral  SpO2: 99% 95% 94% 95%  Weight:      Height:        GENERAL: NAD HEENT: Trach in placed CARDIOVASCULAR: S 1, S 2 RRR RESPIRATORY: BL [place] GASTROINTESTINAL: BS present, soft, , G tube in placed EXTREMITIES: BL BKA>     Data Reviewed:  Imaging Studies: CT ANGIO NECK W OR WO CONTRAST Result Date: 03/29/2024 EXAM: CTA Neck 03/29/2024 05:39:44 AM TECHNIQUE: CT of the neck was performed without and with the administration of 75 mL of iohexol (OMNIPAQUE) 350 MG/ML injection. Multiplanar 2D and/or 3D reformatted images are provided for review. Automated exposure control, iterative reconstruction, and/or weight based adjustment of the mA/kV was utilized to reduce the radiation dose to as low as reasonably achievable. Stenosis of the internal carotid arteries measured using NASCET criteria. COMPARISON: CT of the neck dated 02/27/2024 and PET/CT fusion  study dated 03/09/2024. CLINICAL HISTORY: Pharyngeal cancer, oral bleeding need to evaluate for major vessel invasion by the tumor extravasation. FINDINGS: AORTIC ARCH AND ARCH VESSELS: No dissection or  arterial injury. No significant stenosis of the brachiocephalic or subclavian arteries. CERVICAL CAROTID ARTERIES: No dissection, arterial injury, or hemodynamically significant stenosis by NASCET criteria. No encasement of the carotid arteries or proximal branches of the external carotid arteries. CERVICAL VERTEBRAL ARTERIES: No dissection, arterial injury, or significant stenosis. LUNGS AND MEDIASTINUM: Since the previous studies, a tracheostomy tube has been placed and its tip is in an appropriate position within the trachea. SOFT TISSUES: There is nodular thickening and enhancement again demonstrated within the posterior pharyngeal wall of the oral pharynx and hypopharynx bilaterally, more extensive on the left. There is no evidence of active bleeding. BONES: No acute abnormality. IMPRESSION: 1. Nodular thickening and enhancement within the posterior pharyngeal wall of the oral and hypopharynx bilaterally, more extensive on the left, without evidence of active bleeding or major vessel invasion 2. Tracheostomy tube in appropriate position within the trachea Electronically signed by: Evalene Coho MD 03/29/2024 06:38 AM EDT RP Workstation: [id number]   CT ABDOMEN WO CONTRAST Result Date: 03/29/2024 CLINICAL DATA:  Assess G-tube placement. Head and neck cancer. * Tracking Code: BO * EXAM: CT ABDOMEN WITHOUT CONTRAST TECHNIQUE: Multidetector CT imaging of the abdomen was performed following the standard protocol without IV contrast. RADIATION DOSE REDUCTION: This exam was performed according to the departmental dose-optimization program which includes automated exposure control, adjustment of the mA and/or kV according to patient size and/or use of iterative reconstruction technique. COMPARISON:  PET-CT 03/09/2024 FINDINGS: Image quality degraded by beam hardening artifact emanating from retained barium contrast material in the colon . Lower chest: There is debris in the bronchus intermedius and left lower  lobe airway. Central collapse/consolidative opacity is seen in the left upper lobe (image 9/series 6 with peripheral airway impaction seen in the posterior lower lobes bilaterally. Dependent atelectasis noted in both lung bases with trace bilateral pleural effusions. Hepatobiliary: No suspicious focal abnormality in the liver on this study without intravenous contrast. Gallbladder is distended. Tiny calcified gallstones evident. Porta hepatis, pancreatic head, and descending duodenum obscured by beam hardening artifact. Pancreas: Pancreas essentially obscured by beam hardening artifact from retained barium contrast material in the colon. Spleen: No splenomegaly. No suspicious focal mass lesion. Adrenals/Urinary Tract: No adrenal nodule or mass. Right kidney unremarkable. Tiny nonobstructing stone identified lower pole left kidney. Stomach/Bowel: Stomach is nondistended in largely obscured by artifact. As above, descending duodenum is obscured. No small bowel dilatation within the visualized abdomen. Diffuse colonic distension noted with retained contrast material. Vascular/Lymphatic: There is moderate atherosclerotic calcification of the abdominal aorta without aneurysm. No retroperitoneal lymphadenopathy. Gastrohepatic and hepatoduodenal ligaments are largely obscured by beam hardening artifact. Other: No intraperitoneal free fluid. Musculoskeletal: No worrisome lytic or sclerotic osseous abnormality. Old posterior left rib fractures evident. Compression deformity at T11 and T12 is similar to chest CT of 02/20/2024. IMPRESSION: 1. Image quality degraded by beam hardening artifact emanating from retained barium contrast material in the colon. 2. Debris in the bronchus intermedius and left lower lobe airway with central collapse/consolidative opacity in the left upper lobe. Imaging features concerning for aspiration with associated infectious/inflammatory consolidative opacity in the central left lower lobe. 3.  Stomach is nondistended and largely obscured by artifact. 4. Diffuse colonic distension with retained contrast material. 5. Cholelithiasis. 6. Tiny nonobstructing stone lower pole left kidney. 7.  Aortic Atherosclerosis (ICD10-I70.0). Electronically Signed  By: Camellia Candle M.D.   On: 03/29/2024 06:02    There are no new results to review at this time.  Previous records (including but not limited to H&P, progress notes, nursing notes, TOC management) were reviewed in assessment of this patient.  Labs: CBC: No results for input(s): WBC, NEUTROABS, HGB, HCT, MCV, PLT in the last 168 hours. Basic Metabolic Panel: No results for input(s): NA, K, CL, CO2, GLUCOSE, BUN, CREATININE, CALCIUM , MG, PHOS in the last 168 hours. Liver Function Tests: No results for input(s): AST, ALT, ALKPHOS, BILITOT, PROT, ALBUMIN  in the last 168 hours. CBG: Recent Labs  Lab 04/25/24 1028  GLUCAP 138*    Scheduled Meds:  atorvastatin   40 mg Per Tube q1800   diclofenac Sodium  2 g Topical QID   docusate  100 mg Per Tube BID   famotidine  20 mg Per Tube BID   free water  125 mL Per Tube Q6H   glycopyrrolate   1 mg Per Tube TID   levETIRAcetam  500 mg Per Tube BID   methocarbamol   500 mg Per Tube Q8H   metoprolol  tartrate  37.5 mg Per Tube BID   mouth rinse  15 mL Mouth Rinse 4 times per day   polyethylene glycol  17 g Per Tube Daily   scopolamine  1 patch Transdermal Q72H   Continuous Infusions:  feeding supplement (OSMOLITE 1.5 CAL) 1,000 mL (04/27/24 1332)   PRN Meds:.acetaminophen , glycopyrrolate , guaiFENesin -dextromethorphan, hydrALAZINE , HYDROmorphone  (DILAUDID ) injection, ondansetron  (ZOFRAN ) IV, mouth rinse, oxyCODONE , sodium bicarbonate/sodium chloride   Family Communication: Not at bedside  Disposition: Status is: Inpatient Remains inpatient appropriate because: Disposition     Time spent: 31 minutes  Length of inpatient stay: 31  days  Author: Owen DELENA Lore, MD 04/27/2024 3:23 PM  For on call review [email or web address].

## 2024-04-27 NOTE — Progress Notes (Signed)
 Trach ck completed. No distress noted at this time.

## 2024-04-28 ENCOUNTER — Ambulatory Visit

## 2024-04-28 DIAGNOSIS — J392 Other diseases of pharynx: Secondary | ICD-10-CM | POA: Diagnosis not present

## 2024-04-28 MED ORDER — POLYETHYLENE GLYCOL 3350 17 G PO PACK: 17.0000 g | PACK | Freq: Every day | ORAL | 0 refills | Status: DC

## 2024-04-28 MED ORDER — FAMOTIDINE 20 MG PO TABS: 20.0000 mg | ORAL_TABLET | Freq: Two times a day (BID) | ORAL | 0 refills | Status: DC

## 2024-04-28 MED ORDER — FREE WATER: 125.0000 mL | Freq: Four times a day (QID) | 0 refills | Status: DC

## 2024-04-28 MED ORDER — LEVETIRACETAM 500 MG PO TABS: 500.0000 mg | ORAL_TABLET | Freq: Two times a day (BID) | ORAL | 1 refills | Status: DC

## 2024-04-28 MED ORDER — GLYCOPYRROLATE 1 MG PO TABS: 1.0000 mg | ORAL_TABLET | Freq: Three times a day (TID) | ORAL | 0 refills | Status: DC

## 2024-04-28 MED ORDER — DICLOFENAC SODIUM 1 % EX GEL: 2.0000 g | Freq: Four times a day (QID) | CUTANEOUS | 0 refills | Status: DC

## 2024-04-28 MED ORDER — OSMOLITE 1.5 CAL PO LIQD: 1000.0000 mL | ORAL | 3 refills | Status: DC

## 2024-04-28 MED ORDER — OXYCODONE HCL 5 MG PO TABS: 5.0000 mg | ORAL_TABLET | ORAL | 0 refills | Status: DC | PRN

## 2024-04-28 MED ORDER — SCOPOLAMINE 1 MG/3DAYS TD PT72: 1.0000 | MEDICATED_PATCH | TRANSDERMAL | 12 refills | Status: DC

## 2024-04-28 MED ORDER — METHOCARBAMOL 500 MG PO TABS: 500.0000 mg | ORAL_TABLET | Freq: Three times a day (TID) | ORAL | 0 refills | Status: DC

## 2024-04-28 MED ORDER — GUAIFENESIN-DM 100-10 MG/5ML PO SYRP: 5.0000 mL | ORAL_SOLUTION | ORAL | 0 refills | Status: DC | PRN

## 2024-04-28 NOTE — Progress Notes (Signed)
 Patient continues to refuse all care from nursing staff. Will continue to monitor

## 2024-04-28 NOTE — Progress Notes (Signed)
 Report called to [facility] to [place], [place]

## 2024-04-28 NOTE — TOC Transition Note (Addendum)
 Transition of Care [facility]) - Discharge Note   Patient Details  Name: Stephen Bowen MRN: [id number] Date of Birth: 04/12/1956  [facility] CM/SW Contact:  Toy LITTIE Agar, RN Phone Number:[phone]  04/28/2024, 2:18 PM   Clinical Narrative:    Patient to return to [facility] CM has confirmed with Sherri that facility is ready to accept patient. Discharge summary has been faxed for review. Facility has requested that  RN has been given info to call report. Transportation has been arranged via [facility].     Final next level of care: Skilled Nursing Facility Barriers to Discharge: No Barriers Identified   Patient Goals and CMS Choice Patient states their goals for this hospitalization and ongoing recovery are:: Return to rehab refusing all treatments CMS Medicare.gov Compare Post Acute Care list provided to:: Patient Choice offered to / list presented to : Patient, Sibling [place] ownership interest in [facility].provided to:: Sibling    Discharge Placement              Patient chooses bed at:  ([facility]) Patient to be transferred to facility by: [facility] Name of family member notified: Zachary Nole attempted to call recording states call can not be completed at dialed    Discharge Plan and Services Additional resources added to the After Visit Summary for   In-house Referral: Clinical Social Work              DME Arranged: N/A DME Agency: NA       HH Arranged: NA HH Agency: NA        Social Drivers of Health (SDOH) Interventions SDOH Screenings   Food Insecurity: Patient Unable To Answer (03/27/2024)  Housing: Low Risk  (03/27/2024)  Transportation Needs: No Transportation Needs (03/27/2024)  Utilities: Not At Risk (03/27/2024)  Depression (PHQ2-9): Low Risk  (03/02/2024)  Social Connections: Patient Unable To Answer (03/27/2024)  Tobacco Use: High Risk (03/30/2024)     Readmission Risk Interventions     No data to  display

## 2024-04-28 NOTE — Progress Notes (Signed)
 Trach ck completed.

## 2024-04-28 NOTE — Plan of Care (Signed)
  Problem: Education: Goal: Knowledge of General Education information will improve Description: Including pain rating scale, medication(s)/side effects and non-pharmacologic comfort measures Outcome: Progressing   Problem: Health Behavior/Discharge Planning: Goal: Ability to manage health-related needs will improve Outcome: Progressing   Problem: Clinical Measurements: Goal: Ability to maintain clinical measurements within normal limits will improve Outcome: Progressing Goal: Will remain free from infection Outcome: Progressing Goal: Diagnostic test results will improve Outcome: Progressing Goal: Respiratory complications will improve Outcome: Progressing Goal: Cardiovascular complication will be avoided Outcome: Progressing   Problem: Activity: Goal: Risk for activity intolerance will decrease Outcome: Progressing   Problem: Nutrition: Goal: Adequate nutrition will be maintained Outcome: Progressing   Problem: Coping: Goal: Level of anxiety will decrease Outcome: Progressing   Problem: Elimination: Goal: Will not experience complications related to bowel motility Outcome: Progressing Goal: Will not experience complications related to urinary retention Outcome: Progressing   Problem: Pain Managment: Goal: General experience of comfort will improve and/or be controlled Outcome: Progressing   Problem: Safety: Goal: Ability to remain free from injury will improve Outcome: Progressing   Problem: Skin Integrity: Goal: Risk for impaired skin integrity will decrease Outcome: Progressing   Problem: Education: Goal: Knowledge about tracheostomy care/management will improve Outcome: Progressing   Problem: Activity: Goal: Ability to tolerate increased activity will improve Outcome: Progressing   Problem: Health Behavior/Discharge Planning: Goal: Ability to manage tracheostomy will improve Outcome: Progressing   Problem: Respiratory: Goal: Patent airway  maintenance will improve Outcome: Progressing   Problem: Role Relationship: Goal: Ability to communicate will improve Outcome: Progressing   Problem: Education: Goal: Ability to describe self-care measures that may prevent or decrease complications (Diabetes Survival Skills Education) will improve Outcome: Progressing Goal: Individualized Educational Video(s) Outcome: Progressing   Problem: Coping: Goal: Ability to adjust to condition or change in health will improve Outcome: Progressing   Problem: Fluid Volume: Goal: Ability to maintain a balanced intake and output will improve Outcome: Progressing   Problem: Health Behavior/Discharge Planning: Goal: Ability to identify and utilize available resources and services will improve Outcome: Progressing Goal: Ability to manage health-related needs will improve Outcome: Progressing   Problem: Metabolic: Goal: Ability to maintain appropriate glucose levels will improve Outcome: Progressing   Problem: Nutritional: Goal: Maintenance of adequate nutrition will improve Outcome: Progressing Goal: Progress toward achieving an optimal weight will improve Outcome: Progressing   Problem: Skin Integrity: Goal: Risk for impaired skin integrity will decrease Outcome: Progressing   Problem: Tissue Perfusion: Goal: Adequacy of tissue perfusion will improve Outcome: Progressing   Problem: Activity: Goal: Ability to tolerate increased activity will improve Outcome: Progressing   Problem: Respiratory: Goal: Ability to maintain a clear airway and adequate ventilation will improve Outcome: Progressing   Problem: Role Relationship: Goal: Method of communication will improve Outcome: Progressing

## 2024-04-28 NOTE — Progress Notes (Signed)
 Pt discharge via [facility] to [facility]. IV removed. Tube feed stopped. AVS and all paperwork given to [facility] for facility

## 2024-04-28 NOTE — Progress Notes (Addendum)
 Trac ck completed. Pt refused to be suction.

## 2024-04-28 NOTE — Discharge Summary (Addendum)
 Physician Discharge Summary   Patient: Stephen Bowen MRN: [id number] DOB: [birth year]  Admit date:     03/27/2024  Discharge date: 04/28/24  Discharge Physician: Owen DELENA Lore   PCP: Trinidad Glisson, MD   Recommendations at discharge:   Needs to follow up with oncologist, to discussed further  treatment. Dr Lynwood Hammersmith Needs follow up with palliative care. For goals of care.   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 *  Hospital Course: 68 y.o. male with past medical history significant for CAD/PAD, DM2, HTN, HLD, questionable dementia who presented to [facility] ED on 03/27/2024 for biopsy of pharyngeal mass and tracheostomy placement. Patient underwent posterior pharyngeal mass biopsy with tracheostomy by ENT, Dr. Linell on 03/27/2024; with frozen section during case positive for SCC. Patient was admitted to the intensive care unit/critical care service postoperatively.   Significant hospital events: 10/3: laryngoscopy, biopsy, tracheostomy; admitted to ICU/PCCM postoperatively  10/6: gtube placement by Dr. Ebbie; transferred to [facility] 10/9: Transferred to [facility] for radiation 10/22: Continues to refuse treatment/radiation, awaiting return to SNF but needs tracheostomy in place x 30 days, anticipate discharge back to [facility] on 11/3  Assessment and Plan: Squamous cell  carcinoma of the pharynx s/p tracheostomy -Patient presenting for biopsy of pharyngeal mass and tracheostomy placement performed by ENT on 03/27/2024.  -Postoperatively admitted to the pulmonary critical care service/ICU. - Pathology consistent with squamous cell carcinoma. - Seen by medical oncology, Dr. Conchetta on 10/10; patient not interested in chemotherapy.  -Radiation oncology was consulted with recommendation of 7 weeks of radiation therapy which patient has been noncompliant and now has been discontinued.  - Continues on trach care, 5 L via trach collar.  Scopolamine, glycopyrrolate  for thick secretions.  -Palliative care following.  Needs placement. SW consulted.  Stable to be transfer to long term care.  Needs follow up with oncologist Lynwood Cedar.   Providencia/Klebsiella tracheostomy infection-resolved - Completed 7-day course of ceftriaxone.   Seizure disorder - Continue with Keppra.    Hypertension continue with  Metoprolol    Diabetes mellitus - SSI   PAD s/p BL AKA/CAD - Noted.  Continue with Statin    Severe protein calorie malnutrition/adult failure to thrive  - Secondary to above.  G-tube placed 10/6.  Continues on tube feeds.   Goals of care - Patient continues to be noncompliant with medications, refusing therapy from oncology and radiology.  Followed by palliative care.  Recommending hospice if the patient does not want to pursue further aggressive treatment.  In the meantime, plan to discharged back to SNF until 11/3.          Disposition: Long term care facility Diet recommendation:  NPO on enteral nutrition  DISCHARGE MEDICATION: Allergies as of 04/28/2024   No Known Allergies      Medication List     STOP taking these medications    aspirin  EC 81 MG tablet  fenofibrate micronized 67 MG capsule Commonly known as: LOFIBRA   lisinopril  5 MG tablet Commonly known as: ZESTRIL    metFORMIN 500 MG tablet Commonly known as: GLUCOPHAGE    metoprolol  succinate 25 MG 24 hr tablet Commonly known as: TOPROL -XL   metoprolol  succinate 50 MG 24 hr tablet Commonly known as: TOPROL -XL       TAKE these medications    atorvastatin  40 MG tablet Commonly known as: LIPITOR  Take 1 tablet (40 mg total) by mouth daily at 6 PM.   bisacodyl  10 MG suppository Commonly known as: DULCOLAX Place 10 mg rectally as needed for moderate constipation.   Chloraseptic 1.4 % Liqd Generic drug: phenol Use as directed 2 sprays in the mouth or throat every 4 (four) hours as needed for throat irritation / pain.   diclofenac Sodium 1 % Gel Commonly known as: VOLTAREN Apply 2 g topically 4 (four) times daily.   docusate sodium  100 MG capsule Commonly known as: COLACE Take 1 capsule (100 mg total) by mouth daily. What changed:  when to take this reasons to take this   famotidine 20 MG tablet Commonly known as: PEPCID Place 1 tablet (20 mg total) into feeding tube 2 (two) times daily.   feeding supplement (OSMOLITE 1.5 CAL) Liqd Place 1,000 mLs into feeding tube continuous.   free water Soln Place 125 mLs into feeding tube every 6 (six) hours.   gabapentin  100 MG capsule Commonly known as: NEURONTIN  Take 100 mg by mouth 3 (three) times daily.   glycopyrrolate  1 MG tablet Commonly known as: ROBINUL  Place 1 tablet (1 mg total) into feeding tube 3 (three) times daily.   guaiFENesin -dextromethorphan 100-10 MG/5ML syrup Commonly known as: ROBITUSSIN DM Place 5 mLs into feeding tube every 4 (four) hours as needed for cough.   levETIRAcetam 500 MG tablet Commonly known as: KEPPRA Place 1 tablet (500 mg total) into feeding tube 2 (two) times daily. What changed:  how to take this Another medication with the same name was removed. Continue taking this medication, and follow the directions you see here.   lidocaine  2 % solution Commonly known as: XYLOCAINE  Use as directed 5 mLs in the mouth or throat 3 (three) times daily.    methocarbamol  500 MG tablet Commonly known as: ROBAXIN  Place 1 tablet (500 mg total) into feeding tube every 8 (eight) hours.   Metoprolol  Tartrate 37.5 MG Tabs Take 1 tablet by mouth 2 (two) times daily.   oxyCODONE  5 MG immediate release tablet Commonly known as: Oxy IR/ROXICODONE  Place 1 tablet (5 mg total) into feeding tube every 4 (four) hours as needed for moderate pain (pain score 4-6).   polyethylene glycol 17 g packet Commonly known as: MIRALAX  / GLYCOLAX  Place 17 g into feeding tube daily.   scopolamine 1 MG/3DAYS Commonly known as: TRANSDERM-SCOP Place 1 patch (1 mg total) onto the skin every 3 (three) days. Start taking on: April 29, 2024         Discharge Exam: Fredricka Weights   04/19/24 9587 04/20/24 0607 04/25/24 0458  Weight: 42.6 kg 40.4 kg 41.1 kg   General; NAD  Condition at discharge: stable  The results of significant diagnostics from this hospitalization (including imaging, microbiology, ancillary and laboratory) are listed below for reference.   Imaging Studies: No results found.  Microbiology: Results for orders placed or performed during the hospital encounter of 03/27/24  Expectorated Sputum Assessment w Gram Stain, Rflx to Resp Cult     Status: None   Collection  Time: 04/03/24 12:10 PM   Specimen: Expectorated Sputum  Result Value Ref Range Status   Specimen Description EXPECTORATED SPUTUM  Final   Special Requests NONE  Final   Sputum evaluation   Final    THIS SPECIMEN IS ACCEPTABLE FOR SPUTUM CULTURE Performed at [facility], [address] [place], [place] [address]    Report Status 04/03/2024 FINAL  Final  Culture, Respiratory w Gram Stain     Status: None   Collection Time: 04/03/24 12:10 PM  Result Value Ref Range Status   Specimen Description   Final    EXPECTORATED SPUTUM Performed at [facility], [address] [place], [place] [address]    Special Requests   Final    NONE  Reflexed from [id number] Performed at [facility], [address], [place], [place] [address]    Gram Stain   Final    MODERATE SQUAMOUS EPITHELIAL CELLS PRESENT FEW WBC PRESENT, PREDOMINANTLY PMN ABUNDANT GRAM NEGATIVE RODS MODERATE GRAM POSITIVE COCCI FEW GRAM POSITIVE RODS Performed at [facility], [address] [place], [place] [address]    Culture   Final    ABUNDANT PROVIDENCIA STUARTII ABUNDANT KLEBSIELLA PNEUMONIAE Two isolates with different morphologies were identified as the same organism.The most resistant organism was reported. MOST RESISTANT ORGANISM REPORTED FOR BOTH ORGANISM 1 P. STUARTII AND 2 K. PNEUMONIAE ABUNDANT PROTEUS MIRABILIS    Report Status 04/08/2024 FINAL  Final   Organism ID, Bacteria PROVIDENCIA STUARTII  Final   Organism ID, Bacteria KLEBSIELLA PNEUMONIAE  Final   Organism ID, Bacteria PROTEUS MIRABILIS  Final      Susceptibility   Klebsiella pneumoniae - MIC*    AMPICILLIN >=32 RESISTANT Resistant     CEFAZOLIN  (NON-URINE) 4 INTERMEDIATE Intermediate     CEFEPIME <=0.12 SENSITIVE Sensitive     ERTAPENEM <=0.12 SENSITIVE Sensitive     CEFTRIAXONE <=0.25 SENSITIVE Sensitive     CIPROFLOXACIN <=0.06 SENSITIVE Sensitive     GENTAMICIN <=1 SENSITIVE Sensitive     MEROPENEM <=0.25 SENSITIVE Sensitive     TRIMETH /SULFA  <=20 SENSITIVE Sensitive     AMPICILLIN/SULBACTAM 8 SENSITIVE Sensitive     PIP/TAZO Value in next row Sensitive      8 SENSITIVEThis is a modified FDA-approved test that has been validated and its performance characteristics determined by the reporting laboratory.  This laboratory is certified under the Clinical Laboratory Improvement Amendments CLIA as qualified to perform high complexity clinical laboratory testing.    * ABUNDANT KLEBSIELLA PNEUMONIAE   Proteus mirabilis - MIC*    AMPICILLIN Value in next row Resistant      8 SENSITIVEThis is a modified FDA-approved test that has been validated and its  performance characteristics determined by the reporting laboratory.  This laboratory is certified under the Clinical Laboratory Improvement Amendments CLIA as qualified to perform high complexity clinical laboratory testing.    CEFAZOLIN  (NON-URINE) Value in next row Intermediate      8 SENSITIVEThis is a modified FDA-approved test that has been validated and its performance characteristics determined by the reporting laboratory.  This laboratory is certified under the Clinical Laboratory Improvement Amendments CLIA as qualified to perform high complexity clinical laboratory testing.    CEFEPIME Value in next row Sensitive      8 SENSITIVEThis is a modified FDA-approved test that has been validated and its performance characteristics determined by the reporting laboratory.  This laboratory is certified under the Clinical Laboratory Improvement Amendments CLIA as qualified  to perform high complexity clinical laboratory testing.    ERTAPENEM Value in next row Sensitive      8 SENSITIVEThis is a modified FDA-approved test that has been validated and its performance characteristics determined by the reporting laboratory.  This laboratory is certified under the Clinical Laboratory Improvement Amendments CLIA as qualified to perform high complexity clinical laboratory testing.    CEFTRIAXONE Value in next row Sensitive      8 SENSITIVEThis is a modified FDA-approved test that has been validated and its performance characteristics determined by the reporting laboratory.  This laboratory is certified under the Clinical Laboratory Improvement Amendments CLIA as qualified to perform high complexity clinical laboratory testing.    CIPROFLOXACIN Value in next row Resistant      8 SENSITIVEThis is a modified FDA-approved test that has been validated and its performance characteristics determined by the reporting laboratory.  This laboratory is certified under the Clinical Laboratory Improvement Amendments CLIA as  qualified to perform high complexity clinical laboratory testing.    GENTAMICIN Value in next row Sensitive      8 SENSITIVEThis is a modified FDA-approved test that has been validated and its performance characteristics determined by the reporting laboratory.  This laboratory is certified under the Clinical Laboratory Improvement Amendments CLIA as qualified to perform high complexity clinical laboratory testing.    MEROPENEM Value in next row Sensitive      8 SENSITIVEThis is a modified FDA-approved test that has been validated and its performance characteristics determined by the reporting laboratory.  This laboratory is certified under the Clinical Laboratory Improvement Amendments CLIA as qualified to perform high complexity clinical laboratory testing.    TRIMETH /SULFA  Value in next row Resistant      8 SENSITIVEThis is a modified FDA-approved test that has been validated and its performance characteristics determined by the reporting laboratory.  This laboratory is certified under the Clinical Laboratory Improvement Amendments CLIA as qualified to perform high complexity clinical laboratory testing.    AMPICILLIN/SULBACTAM Value in next row Sensitive      8 SENSITIVEThis is a modified FDA-approved test that has been validated and its performance characteristics determined by the reporting laboratory.  This laboratory is certified under the Clinical Laboratory Improvement Amendments CLIA as qualified to perform high complexity clinical laboratory testing.    PIP/TAZO Value in next row Sensitive      <=4 SENSITIVEThis is a modified FDA-approved test that has been validated and its performance characteristics determined by the reporting laboratory.  This laboratory is certified under the Clinical Laboratory Improvement Amendments CLIA as qualified to perform high complexity clinical laboratory testing.    * ABUNDANT PROTEUS MIRABILIS   Providencia stuartii - MIC*    AMPICILLIN Value in next row  Resistant      <=4 SENSITIVEThis is a modified FDA-approved test that has been validated and its performance characteristics determined by the reporting laboratory.  This laboratory is certified under the Clinical Laboratory Improvement Amendments CLIA as qualified to perform high complexity clinical laboratory testing.    CEFEPIME Value in next row Sensitive      <=4 SENSITIVEThis is a modified FDA-approved test that has been validated and its performance characteristics determined by the reporting laboratory.  This laboratory is certified under the Clinical Laboratory Improvement Amendments CLIA as qualified to perform high complexity clinical laboratory testing.    ERTAPENEM Value in next row Intermediate      <=4 SENSITIVEThis is a modified FDA-approved test that has been validated and its performance  characteristics determined by the reporting laboratory.  This laboratory is certified under the Clinical Laboratory Improvement Amendments CLIA as qualified to perform high complexity clinical laboratory testing.    CEFTRIAXONE Value in next row Sensitive      <=4 SENSITIVEThis is a modified FDA-approved test that has been validated and its performance characteristics determined by the reporting laboratory.  This laboratory is certified under the Clinical Laboratory Improvement Amendments CLIA as qualified to perform high complexity clinical laboratory testing.    CIPROFLOXACIN Value in next row Resistant      <=4 SENSITIVEThis is a modified FDA-approved test that has been validated and its performance characteristics determined by the reporting laboratory.  This laboratory is certified under the Clinical Laboratory Improvement Amendments CLIA as qualified to perform high complexity clinical laboratory testing.    GENTAMICIN Value in next row Resistant      <=4 SENSITIVEThis is a modified FDA-approved test that has been validated and its performance characteristics determined by the reporting laboratory.   This laboratory is certified under the Clinical Laboratory Improvement Amendments CLIA as qualified to perform high complexity clinical laboratory testing.    MEROPENEM Value in next row Resistant      <=4 SENSITIVEThis is a modified FDA-approved test that has been validated and its performance characteristics determined by the reporting laboratory.  This laboratory is certified under the Clinical Laboratory Improvement Amendments CLIA as qualified to perform high complexity clinical laboratory testing.    TRIMETH /SULFA  Value in next row Resistant      <=4 SENSITIVEThis is a modified FDA-approved test that has been validated and its performance characteristics determined by the reporting laboratory.  This laboratory is certified under the Clinical Laboratory Improvement Amendments CLIA as qualified to perform high complexity clinical laboratory testing.    AMPICILLIN/SULBACTAM Value in next row Resistant      <=4 SENSITIVEThis is a modified FDA-approved test that has been validated and its performance characteristics determined by the reporting laboratory.  This laboratory is certified under the Clinical Laboratory Improvement Amendments CLIA as qualified to perform high complexity clinical laboratory testing.    PIP/TAZO Value in next row Sensitive      <=4 SENSITIVEThis is a modified FDA-approved test that has been validated and its performance characteristics determined by the reporting laboratory.  This laboratory is certified under the Clinical Laboratory Improvement Amendments CLIA as qualified to perform high complexity clinical laboratory testing.    * ABUNDANT PROVIDENCIA STUARTII    Labs: CBC: No results for input(s): WBC, NEUTROABS, HGB, HCT, MCV, PLT in the last 168 hours. Basic Metabolic Panel: No results for input(s): NA, K, CL, CO2, GLUCOSE, BUN, CREATININE, CALCIUM , MG, PHOS in the last 168 hours. Liver Function Tests: No results for input(s): AST,  ALT, ALKPHOS, BILITOT, PROT, ALBUMIN  in the last 168 hours. CBG: Recent Labs  Lab 04/25/24 1028  GLUCAP 138*    Discharge time spent: greater than 30 minutes.  Signed: Owen DELENA Lore, MD [facility] 04/28/2024

## 2024-04-29 ENCOUNTER — Ambulatory Visit

## 2024-04-30 ENCOUNTER — Ambulatory Visit

## 2024-05-01 ENCOUNTER — Ambulatory Visit

## 2024-05-04 ENCOUNTER — Ambulatory Visit

## 2024-05-05 ENCOUNTER — Ambulatory Visit

## 2024-05-06 ENCOUNTER — Ambulatory Visit

## 2024-05-07 ENCOUNTER — Ambulatory Visit

## 2024-05-08 ENCOUNTER — Ambulatory Visit

## 2024-05-11 ENCOUNTER — Ambulatory Visit

## 2024-05-12 ENCOUNTER — Ambulatory Visit

## 2024-05-13 ENCOUNTER — Ambulatory Visit

## 2024-05-13 ENCOUNTER — Encounter: Payer: Self-pay | Admitting: Oncology

## 2024-05-14 ENCOUNTER — Ambulatory Visit

## 2024-05-15 ENCOUNTER — Ambulatory Visit

## 2024-05-18 ENCOUNTER — Ambulatory Visit

## 2024-05-19 ENCOUNTER — Ambulatory Visit

## 2024-05-20 ENCOUNTER — Ambulatory Visit

## 2024-05-25 ENCOUNTER — Ambulatory Visit

## 2024-05-26 ENCOUNTER — Ambulatory Visit

## 2024-05-27 ENCOUNTER — Ambulatory Visit

## 2024-05-28 ENCOUNTER — Ambulatory Visit

## 2024-05-29 ENCOUNTER — Ambulatory Visit

## 2024-06-01 ENCOUNTER — Ambulatory Visit

## 2024-06-02 ENCOUNTER — Ambulatory Visit

## 2024-07-26 DEATH — deceased
